# Patient Record
Sex: Female | Born: 1940
Health system: Southern US, Community
[De-identification: ages and names within clinical notes are randomized; demographics above are authoritative.]

## PROBLEM LIST (undated history)

## (undated) DIAGNOSIS — I319 Disease of pericardium, unspecified: Secondary | ICD-10-CM

## (undated) DIAGNOSIS — G459 Transient cerebral ischemic attack, unspecified: Secondary | ICD-10-CM

## (undated) DIAGNOSIS — Z9989 Dependence on other enabling machines and devices: Secondary | ICD-10-CM

## (undated) DIAGNOSIS — R42 Dizziness and giddiness: Secondary | ICD-10-CM

## (undated) DIAGNOSIS — I509 Heart failure, unspecified: Secondary | ICD-10-CM

## (undated) DIAGNOSIS — F319 Bipolar disorder, unspecified: Secondary | ICD-10-CM

## (undated) DIAGNOSIS — J189 Pneumonia, unspecified organism: Secondary | ICD-10-CM

## (undated) DIAGNOSIS — G4733 Obstructive sleep apnea (adult) (pediatric): Secondary | ICD-10-CM

## (undated) DIAGNOSIS — E039 Hypothyroidism, unspecified: Secondary | ICD-10-CM

## (undated) DIAGNOSIS — E785 Hyperlipidemia, unspecified: Secondary | ICD-10-CM

## (undated) DIAGNOSIS — Z8711 Personal history of peptic ulcer disease: Secondary | ICD-10-CM

## (undated) DIAGNOSIS — Z87442 Personal history of urinary calculi: Secondary | ICD-10-CM

## (undated) DIAGNOSIS — K219 Gastro-esophageal reflux disease without esophagitis: Secondary | ICD-10-CM

## (undated) DIAGNOSIS — Z8719 Personal history of other diseases of the digestive system: Secondary | ICD-10-CM

## (undated) DIAGNOSIS — F32A Depression, unspecified: Secondary | ICD-10-CM

## (undated) DIAGNOSIS — R32 Unspecified urinary incontinence: Secondary | ICD-10-CM

## (undated) DIAGNOSIS — F329 Major depressive disorder, single episode, unspecified: Secondary | ICD-10-CM

## (undated) DIAGNOSIS — E119 Type 2 diabetes mellitus without complications: Secondary | ICD-10-CM

## (undated) DIAGNOSIS — K559 Vascular disorder of intestine, unspecified: Secondary | ICD-10-CM

## (undated) DIAGNOSIS — I499 Cardiac arrhythmia, unspecified: Secondary | ICD-10-CM

## (undated) DIAGNOSIS — I10 Essential (primary) hypertension: Secondary | ICD-10-CM

## (undated) DIAGNOSIS — E079 Disorder of thyroid, unspecified: Secondary | ICD-10-CM

## (undated) HISTORY — PX: VAGINAL HYSTERECTOMY: SUR661

## (undated) HISTORY — DX: Major depressive disorder, single episode, unspecified: F32.9

## (undated) HISTORY — DX: Dizziness and giddiness: R42

## (undated) HISTORY — DX: Essential (primary) hypertension: I10

## (undated) HISTORY — PX: TONSILLECTOMY: SUR1361

## (undated) HISTORY — DX: Gastro-esophageal reflux disease without esophagitis: K21.9

## (undated) HISTORY — PX: RETINAL DETACHMENT SURGERY: SHX105

## (undated) HISTORY — DX: Hyperlipidemia, unspecified: E78.5

## (undated) HISTORY — DX: Unspecified urinary incontinence: R32

## (undated) HISTORY — PX: LITHOTRIPSY: SUR834

## (undated) HISTORY — DX: Depression, unspecified: F32.A

## (undated) HISTORY — PX: CATARACT EXTRACTION W/ INTRAOCULAR LENS  IMPLANT, BILATERAL: SHX1307

## (undated) HISTORY — PX: BREAST CYST ASPIRATION: SHX578

## (undated) HISTORY — PX: FRACTURE SURGERY: SHX138

## (undated) HISTORY — PX: EXCISIONAL HEMORRHOIDECTOMY: SHX1541

## (undated) HISTORY — DX: Vascular disorder of intestine, unspecified: K55.9

## (undated) HISTORY — PX: LAPAROSCOPIC CHOLECYSTECTOMY: SUR755

## (undated) HISTORY — PX: APPENDECTOMY: SHX54

## (undated) HISTORY — PX: EYE SURGERY: SHX253

## (undated) HISTORY — DX: Disorder of thyroid, unspecified: E07.9

---

## 1998-03-31 ENCOUNTER — Encounter: Payer: Self-pay | Admitting: Cardiovascular Disease

## 1998-03-31 ENCOUNTER — Inpatient Hospital Stay (HOSPITAL_COMMUNITY): Admission: EM | Admit: 1998-03-31 | Discharge: 1998-04-01 | Payer: Self-pay | Admitting: Emergency Medicine

## 1998-03-31 ENCOUNTER — Encounter: Payer: Self-pay | Admitting: Emergency Medicine

## 1998-05-26 ENCOUNTER — Ambulatory Visit: Admission: RE | Admit: 1998-05-26 | Discharge: 1998-05-26 | Payer: Self-pay | Admitting: *Deleted

## 1998-05-29 ENCOUNTER — Other Ambulatory Visit: Admission: RE | Admit: 1998-05-29 | Discharge: 1998-05-29 | Payer: Self-pay | Admitting: *Deleted

## 1999-01-06 ENCOUNTER — Encounter: Payer: Self-pay | Admitting: Pulmonary Disease

## 2000-09-06 ENCOUNTER — Ambulatory Visit (HOSPITAL_COMMUNITY): Admission: RE | Admit: 2000-09-06 | Discharge: 2000-09-06 | Payer: Self-pay | Admitting: Internal Medicine

## 2000-09-06 ENCOUNTER — Encounter: Payer: Self-pay | Admitting: Internal Medicine

## 2001-12-07 ENCOUNTER — Ambulatory Visit (HOSPITAL_COMMUNITY): Admission: RE | Admit: 2001-12-07 | Discharge: 2001-12-08 | Payer: Self-pay | Admitting: Ophthalmology

## 2001-12-07 ENCOUNTER — Encounter: Payer: Self-pay | Admitting: Ophthalmology

## 2003-03-11 ENCOUNTER — Other Ambulatory Visit: Admission: RE | Admit: 2003-03-11 | Discharge: 2003-03-11 | Payer: Self-pay | Admitting: Internal Medicine

## 2006-12-06 ENCOUNTER — Other Ambulatory Visit: Admission: RE | Admit: 2006-12-06 | Discharge: 2006-12-06 | Payer: Self-pay | Admitting: Internal Medicine

## 2006-12-09 ENCOUNTER — Encounter: Admission: RE | Admit: 2006-12-09 | Discharge: 2006-12-09 | Payer: Self-pay | Admitting: Internal Medicine

## 2006-12-14 ENCOUNTER — Ambulatory Visit (HOSPITAL_COMMUNITY): Admission: RE | Admit: 2006-12-14 | Discharge: 2006-12-14 | Payer: Self-pay | Admitting: Internal Medicine

## 2006-12-20 ENCOUNTER — Encounter (INDEPENDENT_AMBULATORY_CARE_PROVIDER_SITE_OTHER): Payer: Self-pay | Admitting: *Deleted

## 2006-12-20 ENCOUNTER — Ambulatory Visit (HOSPITAL_COMMUNITY): Admission: RE | Admit: 2006-12-20 | Discharge: 2006-12-20 | Payer: Self-pay | Admitting: *Deleted

## 2007-02-22 ENCOUNTER — Encounter (INDEPENDENT_AMBULATORY_CARE_PROVIDER_SITE_OTHER): Payer: Self-pay | Admitting: Surgery

## 2007-02-22 ENCOUNTER — Ambulatory Visit (HOSPITAL_COMMUNITY): Admission: RE | Admit: 2007-02-22 | Discharge: 2007-02-22 | Payer: Self-pay | Admitting: Surgery

## 2007-12-18 ENCOUNTER — Ambulatory Visit (HOSPITAL_COMMUNITY): Admission: RE | Admit: 2007-12-18 | Discharge: 2007-12-18 | Payer: Self-pay | Admitting: Internal Medicine

## 2008-12-30 ENCOUNTER — Ambulatory Visit (HOSPITAL_COMMUNITY): Admission: RE | Admit: 2008-12-30 | Discharge: 2008-12-30 | Payer: Self-pay | Admitting: Internal Medicine

## 2009-08-13 ENCOUNTER — Encounter: Admission: RE | Admit: 2009-08-13 | Discharge: 2009-10-16 | Payer: Self-pay | Admitting: Internal Medicine

## 2010-01-28 ENCOUNTER — Ambulatory Visit (HOSPITAL_COMMUNITY): Admission: RE | Admit: 2010-01-28 | Payer: Self-pay | Source: Home / Self Care | Admitting: Internal Medicine

## 2010-02-18 ENCOUNTER — Encounter: Payer: Self-pay | Admitting: Internal Medicine

## 2010-02-27 ENCOUNTER — Emergency Department (HOSPITAL_COMMUNITY): Payer: Medicare Other

## 2010-02-27 ENCOUNTER — Inpatient Hospital Stay (HOSPITAL_COMMUNITY)
Admission: EM | Admit: 2010-02-27 | Discharge: 2010-03-04 | DRG: 395 | Disposition: A | Discharge status: Home / Self Care | Payer: Medicare Other | Attending: Internal Medicine | Admitting: Internal Medicine

## 2010-02-27 DIAGNOSIS — R1012 Left upper quadrant pain: Secondary | ICD-10-CM | POA: Diagnosis present

## 2010-02-27 DIAGNOSIS — E669 Obesity, unspecified: Secondary | ICD-10-CM | POA: Diagnosis present

## 2010-02-27 DIAGNOSIS — R112 Nausea with vomiting, unspecified: Secondary | ICD-10-CM | POA: Diagnosis present

## 2010-02-27 DIAGNOSIS — E860 Dehydration: Secondary | ICD-10-CM | POA: Diagnosis present

## 2010-02-27 DIAGNOSIS — L270 Generalized skin eruption due to drugs and medicaments taken internally: Secondary | ICD-10-CM | POA: Diagnosis not present

## 2010-02-27 DIAGNOSIS — I10 Essential (primary) hypertension: Secondary | ICD-10-CM | POA: Diagnosis present

## 2010-02-27 DIAGNOSIS — T368X5A Adverse effect of other systemic antibiotics, initial encounter: Secondary | ICD-10-CM | POA: Diagnosis not present

## 2010-02-27 DIAGNOSIS — E039 Hypothyroidism, unspecified: Secondary | ICD-10-CM | POA: Diagnosis present

## 2010-02-27 DIAGNOSIS — A088 Other specified intestinal infections: Secondary | ICD-10-CM | POA: Diagnosis present

## 2010-02-27 DIAGNOSIS — E119 Type 2 diabetes mellitus without complications: Secondary | ICD-10-CM | POA: Diagnosis present

## 2010-02-27 DIAGNOSIS — T373X5A Adverse effect of other antiprotozoal drugs, initial encounter: Secondary | ICD-10-CM | POA: Diagnosis not present

## 2010-02-27 DIAGNOSIS — K559 Vascular disorder of intestine, unspecified: Principal | ICD-10-CM | POA: Diagnosis present

## 2010-02-27 LAB — CBC
HCT: 37.7 % (ref 36.0–46.0)
HCT: 37.1 % (ref 36.0–46.0)
Hemoglobin: 13.6 g/dL (ref 12.0–15.0)
Hemoglobin: 13.4 g/dL (ref 12.0–15.0)
MCH: 31.1 pg (ref 26.0–34.0)
MCHC: 36.1 g/dL — ABNORMAL HIGH (ref 30.0–36.0)
MCV: 85.5 fL (ref 78.0–100.0)
MCV: 86.1 fL (ref 78.0–100.0)
RBC: 4.41 MIL/uL (ref 3.87–5.11)
RDW: 11.8 % (ref 11.5–15.5)
RDW: 11.9 % (ref 11.5–15.5)
WBC: 8.8 10*3/uL (ref 4.0–10.5)

## 2010-02-27 LAB — PROTIME-INR: INR: 1 (ref 0.00–1.49)

## 2010-02-27 LAB — POCT I-STAT, CHEM 8
Chloride: 95 mEq/L — ABNORMAL LOW (ref 96–112)
Glucose, Bld: 109 mg/dL — ABNORMAL HIGH (ref 70–99)
HCT: 42 % (ref 36.0–46.0)
Hemoglobin: 14.3 g/dL (ref 12.0–15.0)
Potassium: 3.7 mEq/L (ref 3.5–5.1)
Sodium: 131 mEq/L — ABNORMAL LOW (ref 135–145)

## 2010-02-27 LAB — TYPE AND SCREEN
ABO/RH(D): B POS
Antibody Screen: NEGATIVE

## 2010-02-27 LAB — DIFFERENTIAL
Basophils Absolute: 0 10*3/uL (ref 0.0–0.1)
Eosinophils Relative: 1 % (ref 0–5)
Lymphocytes Relative: 25 % (ref 12–46)
Lymphs Abs: 2.2 10*3/uL (ref 0.7–4.0)
Neutro Abs: 5.5 10*3/uL (ref 1.7–7.7)

## 2010-02-27 LAB — LACTIC ACID, PLASMA: Lactic Acid, Venous: 1.2 mmol/L (ref 0.5–2.2)

## 2010-02-28 LAB — GLUCOSE, CAPILLARY
Glucose-Capillary: 124 mg/dL — ABNORMAL HIGH (ref 70–99)
Glucose-Capillary: 162 mg/dL — ABNORMAL HIGH (ref 70–99)

## 2010-02-28 LAB — BASIC METABOLIC PANEL
CO2: 28 mEq/L (ref 19–32)
Calcium: 8.6 mg/dL (ref 8.4–10.5)
Chloride: 102 mEq/L (ref 96–112)
Creatinine, Ser: 0.79 mg/dL (ref 0.4–1.2)
Glucose, Bld: 129 mg/dL — ABNORMAL HIGH (ref 70–99)
Sodium: 137 mEq/L (ref 135–145)

## 2010-02-28 LAB — CBC
Hemoglobin: 12.5 g/dL (ref 12.0–15.0)
MCH: 30.3 pg (ref 26.0–34.0)
MCV: 87.7 fL (ref 78.0–100.0)
Platelets: 241 10*3/uL (ref 150–400)
RBC: 4.13 MIL/uL (ref 3.87–5.11)
WBC: 8.1 10*3/uL (ref 4.0–10.5)

## 2010-02-28 LAB — ABO/RH: ABO/RH(D): B POS

## 2010-03-01 ENCOUNTER — Inpatient Hospital Stay (HOSPITAL_COMMUNITY): Payer: Medicare Other

## 2010-03-01 LAB — DIFFERENTIAL
Lymphocytes Relative: 28 % (ref 12–46)
Lymphs Abs: 1.9 10*3/uL (ref 0.7–4.0)
Monocytes Relative: 12 % (ref 3–12)
Neutrophils Relative %: 58 % (ref 43–77)

## 2010-03-01 LAB — COMPREHENSIVE METABOLIC PANEL
Alkaline Phosphatase: 92 U/L (ref 39–117)
BUN: 4 mg/dL — ABNORMAL LOW (ref 6–23)
Chloride: 102 mEq/L (ref 96–112)
Creatinine, Ser: 0.71 mg/dL (ref 0.4–1.2)
Glucose, Bld: 125 mg/dL — ABNORMAL HIGH (ref 70–99)
Potassium: 4 mEq/L (ref 3.5–5.1)
Total Bilirubin: 0.5 mg/dL (ref 0.3–1.2)
Total Protein: 6 g/dL (ref 6.0–8.3)

## 2010-03-01 LAB — CBC
HCT: 37.4 % (ref 36.0–46.0)
MCH: 30.2 pg (ref 26.0–34.0)
MCV: 86.8 fL (ref 78.0–100.0)
RDW: 11.9 % (ref 11.5–15.5)
WBC: 6.9 10*3/uL (ref 4.0–10.5)

## 2010-03-01 LAB — MAGNESIUM: Magnesium: 2.2 mg/dL (ref 1.5–2.5)

## 2010-03-01 LAB — GLUCOSE, CAPILLARY
Glucose-Capillary: 117 mg/dL — ABNORMAL HIGH (ref 70–99)
Glucose-Capillary: 102 mg/dL — ABNORMAL HIGH (ref 70–99)

## 2010-03-01 LAB — FECAL LACTOFERRIN, QUANT: Fecal Lactoferrin: POSITIVE

## 2010-03-01 NOTE — H&P (Signed)
[name], [name]             ACCOUNT NO.:  [id number]  MEDICAL RECORD NO.:  [id number]           PATIENT TYPE:  E  LOCATION:  WLED                         FACILITY:  [facility]  PHYSICIAN:  Charlestine Massed, MDDATE OF BIRTH:  [birth year]  DATE OF ADMISSION:  02/27/2010 DATE OF DISCHARGE:                             HISTORY & PHYSICAL   PRIMARY CARE DOCTOR:  Juline Patch, MD  CHIEF COMPLAINT:  Significant diarrhea and blood in the stool.  HISTORY OF PRESENTING ILLNESS:  [name] is a 70 year old female who comes to the ER with the above-mentioned complaints.  The patient said on Tuesday evening, she started having nausea and on Wednesday morning, she has had nausea with vomiting which continued on the whole day with multiple episodes of nausea and vomiting.  On Thursday morning, she started having diarrhea and on Thursday evening, she started seeing some blood in the stools.  She called her PMD and the PMD called today and ordered some medications for nausea and vomiting, and he asked her to go to the ER if she bleeds again.  She did have blood in the stools multiple times after that, so she came to the ER.  The patient denies any significant abdominal pain.  No pain during defecation.  No tenderness.  No chest pain, no shortness of breath.  No fever, no chills.  The patient has some cough which she says is possibly due to some allergies, but no sputum production.  The patient has had previous history of colonic polyps and she has been told that she has some diverticuli also in her.  From what I can recollect from the previous colonoscopy report in December 2008, there were some colonic polyps that were removed but at no place, there is mention about any significant diverticuli present.  Her colonoscopy was done by Dr. Dulce Sellar, which currently we do not have the report on.  PAST HISTORY:  Diabetes mellitus, depression, hypertension, hyperthyroidism, history of  retinal detachment, history of snake bite.  PAST SURGICAL HISTORY:  Appendectomy, hysterectomy, tonsillectomy.  DRUG ALLERGIES:  Allergy to METFORMIN.  SOCIAL HISTORY:  Nonsmoker.  No alcohol.  No drug use.  CURRENT MEDICATIONS:  Aspirin, Onglyza, bupropion, Nexium, Carbatrol, carbamazepine, hydroxyzine, vitamin D3, azole, levothyroxine, Toviaz, and Cipro twice daily recently prescribed.  REVIEW OF SYSTEMS:  A 12-point system review unremarkable.  Positive pertinent features as in history of presenting illness.  Negative otherwise.  PHYSICAL EXAMINATION:  VITAL SIGNS:  In the ED, blood pressure 147/63, heart rate 83, respirations 18, temperature 98.5, and O2 sat 97% on room air.  Afebrile. GENERAL:  The patient is awake, alert, well oriented, not in any distress. HEAD AND NECK:  No JVD.  No bruit.  No nodes.  Tongue is moist. CHEST:  Bilateral air entry is good anteriorly and posteriorly.  No rales or wheeze heard. CARDIAC:  S1 and S2 heard, regular.  No murmur. ABDOMEN:  There is tenderness in the right middle quadrant and the left middle quadrant.  There is also some tenderness as per the patient in the right middle quadrant.  Also, there is no rigidity or guarding.  No rebound tenderness.  No costovertebral angle tenderness.  Bowel sounds positive. EXTREMITIES:  Bilateral trace pedal edema present. CNS:  No obvious motor sensory deficits.  Speech and comprehension intact.  LABS:  CBC; WBC 8.8, hemoglobin 13, hematocrit 37.7, platelets 258.  I- STAT shows BNP, sodium of 131, potassium 3.7, chloride 95, bicarb 26, BUN 7, creatinine 0.8, and glucose 109.  INR is 1.0.  Lactic acid is 1.2.  CAT scan of abdomen and pelvis shows acute inflammatory changes involving the distal transverse colon and proximal descending colon. This could be related to infectious or inflammatory or ischemic colitis. Considering the classic distribution, it could be a watershed infarct.  ASSESSMENT  AND PLAN: 1. Acute gastroenteritis, possibly secondary to norovirus which is     resolving. 2. Lower gastrointestinal bleed, possibly due to;     a.     Questionable diverticulosis as the patient states clots in      the stool.     b.     Possible ischemic colitis due to this location of the      inflammatory change. 3. Colitis as on CAT scan. 4. Diabetes mellitus type 2. 5. Obesity. 6. History of colonic polyps. 7. Hypothyroidism. 8. Hypertension.  PLAN:  At this time, we will admit the patient to Inpatient Service on the regular floor.  She will have a CBC drawn q.8 hourly for the next 8 hours and then everyday.  The next draw will be at 10 p.m.  Continue normal saline at 100 mL per hour.  BMET in the a.m. along with a CBC. If she bleeds repeatedly, we will decide about beginning a bleeding scan at that time.  We will keep her n.p.o. at this time strictly, ice chips can be okay, Synthroid 37.5 mcg IV daily.  We will check a CBG q.4 hourly and since the patient is going to be n.p.o., coverage for the blood sugar more than 250, we will give some insulin coverage at this time.  Zofran p.r.n. for nausea and vomiting, morphine 2 mg q.4 hourly p.r.n. for severe pain and other than that, I am not advising any further testing at this time.  Stool wbc's will be checked.  I would not suggest starting any antibiotics at this time since this looks like straightforward case of viral gastroenteritis with some disruption in the colonic mucosa, causing either ischemic colitis or diverticulosis, diverticular bleed.  We will apply SCDs for DVT prophylaxis at this time.  We will recheck vital signs q.4 hourly for now.  A total of 70 minutes spent on the admission process.     Charlestine Massed, MD     UT/MEDQ  D:  02/27/2010  T:  02/27/2010  Job:  [id number]  cc:   Juline Patch, M.D. Willis Modena, MD  Electronically Signed by Charlestine Massed MD on 02/28/2010 11:47:59 AM

## 2010-03-02 LAB — GLUCOSE, CAPILLARY: Glucose-Capillary: 153 mg/dL — ABNORMAL HIGH (ref 70–99)

## 2010-03-02 LAB — BASIC METABOLIC PANEL
Chloride: 103 mEq/L (ref 96–112)
GFR calc Af Amer: >60 mL/min (ref >60)
Potassium: 4.2 mEq/L (ref 3.5–5.1)
Sodium: 137 mEq/L (ref 135–145)

## 2010-03-02 LAB — CBC
HCT: 37.5 % (ref 36.0–46.0)
Hemoglobin: 13.3 g/dL (ref 12.0–15.0)
MCV: 86.2 fL (ref 78.0–100.0)
Platelets: 308 10*3/uL (ref 150–400)
RBC: 4.35 MIL/uL (ref 3.87–5.11)
WBC: 6.9 10*3/uL (ref 4.0–10.5)

## 2010-03-03 LAB — GLUCOSE, CAPILLARY
Glucose-Capillary: 91 mg/dL (ref 70–99)
Glucose-Capillary: 160 mg/dL — ABNORMAL HIGH (ref 70–99)
Glucose-Capillary: 266 mg/dL — ABNORMAL HIGH (ref 70–99)

## 2010-03-03 LAB — CBC
HCT: 36.2 % (ref 36.0–46.0)
Hemoglobin: 13 g/dL (ref 12.0–15.0)
MCHC: 35.9 g/dL (ref 30.0–36.0)

## 2010-03-04 LAB — CBC
HCT: 37.8 % (ref 36.0–46.0)
Hemoglobin: 13.3 g/dL (ref 12.0–15.0)
MCH: 30.6 pg (ref 26.0–34.0)
MCHC: 35.2 g/dL (ref 30.0–36.0)
MCV: 86.9 fL (ref 78.0–100.0)

## 2010-03-04 LAB — GLUCOSE, CAPILLARY

## 2010-03-04 NOTE — Progress Notes (Signed)
NAMEJOVEE, [name]             ACCOUNT NO.:  [id number]  MEDICAL RECORD NO.:  [id number]           PATIENT TYPE:  I  LOCATION:  1519                         FACILITY:  [facility]  PHYSICIAN:  Charlestine Massed, MDDATE OF BIRTH:  1940/12/15                                PROGRESS NOTE   SUBJECTIVE: The patient seen and examined today.  She feels lot better today, not in any distress.  She did not have any vomiting so far, tolerating food very well.  She still has some tenderness in the left upper quadrant and no fever, no chills.  OBJECTIVE: VITAL SIGNS:  Temperature is 97.9, heart rate 57, respiration 20, blood pressure 159/53, O2 saturation 96% on room air. GENERAL:  The patient is awake, alert, ambulating, not in any distress. No nausea. HEAD AND NECK:  No bleeding oral or nasal mucosa.  No JVD.  Tongue is moist. CHEST:  Bilateral air entry is good anteriorly and posteriorly.  No rales or wheeze heard. CARDIAC:  S1 and S2 heard, regular.  No murmurs. ABDOMEN:  Soft.  There is tenderness in the left upper quadrant still, but no the rigidity or guarding present.  Bowel sounds are heard now and they are normal paced and normal sounds.  No costovertebral angle tenderness. EXTREMITIES:  Trace pedal edema.  LABORATORY DATA: CBC, WBC 9.8, hemoglobin 13, hematocrit 36.1, and platelets 319.  ASSESSMENT/PLAN: 1. Acute gastroenteritis - possibly a viral etiology, currently     resolved. 2. Ischemic colitis. 3. Hematochezia secondary to ischemic colitis. 4. History of diverticulosis which could also had contributed to the     hematochezia. 5. No significant drop in hemoglobin - blood loss was minimal and     resolved. 6. Obesity. 7. Hypertension, currently blood pressure is slightly elevated. 8. Allergy reaction after starting Cipro and Flagyl - not sure of     which one caused the rash, currently off those medications. 9. Diabetes mellitus type  2. 10.Hypothyroidism.  PLAN: 1. GI - the patient's GI issues started few days before she came in,     started nausea, vomiting and then progressed to watery diarrhea and     subsequently presence of red blood in the stools with significant     pain in the left upper quadrant.  The CAT scan findings and the     clinical picture all point towards the cause as ischemic colitis.     The patient possibly got significantly dehydrated due to the     recurrent nausea, vomiting, and diarrhea which possibly could have     led her into hypovolemia, leading to ischemia, and the watershed     zone of the splenic flexure of the colon leading to ischemic     colitis.  To note that the patient has diverticulosis as per the     colonoscopy report in 2008.  Currently, it is not clear which one     is causing the bleed, but the bleeding was minimal and it resolved     at this time.  There is no hemoglobin drop noted.  She is well     hydrated at  this time.  She is tolerating clear liquids and will be     upgrading into full liquids for the morning and then progressed to     soft diet for supper.  The patient is currently well stable and     will need a gastroenterology followup as outpatient with Dr. Sabino Gasser. She will set up followup with the [facility] as     outpatient.  She did say that she had a colonoscopy done by Dr.     Dulce Sellar recently and so will follow with Dr. Dulce Sellar as outpatient. 2. Allergic reaction.  It is not clear which one caused the allergy     since Cipro and Flagyl was started the same time and she started     getting some rash in her thigh after which both medications were     stopped.  Currently, she does not require any medications or     antibiotics at this time.  She is on Solu-Medrol 40 q.12 h, which     will be switched over to prednisone 20 mg for the next 2 days and     taper it over the next two days.  Will continue with the Benadryl     p.o. and Pepcid  p.o.  Currently, there are no signs of any     recurrent allergic reaction. 3. Hypertension.  She is on Benicar.  Her home medications included     Benicar 40 and Azopt 10, so will restart those medications at this     time.  We will restart her home medication of bupropion and     carbamazepine at this time also and we will watch her closely. 4. DVT prophylaxis, currently with SCDs. 5. Diabetes mellitus.  Currently, her blood sugars are stable. To note     that she is allergic to metformin and she was on Onglyza,  we will     restart the Onglyza at this time and watch her closely.  Even with     Solu-Medrol her sugars have not spiked that much.  I would not     check a hemoglobin A1c at this time since it will be erratic and it     can be checked as outpatient.  DISPOSITION: The patient will be ready for discharge in another 2 days.  Will need to follow up with Dr. Dulce Sellar as outpatient.     Charlestine Massed, MD     UT/MEDQ  D:  03/03/2010  T:  03/03/2010  Job:  [id number]  Electronically Signed by Charlestine Massed MD on 03/04/2010 03:15:07 PM

## 2010-03-11 ENCOUNTER — Other Ambulatory Visit: Payer: Self-pay | Admitting: Gastroenterology

## 2010-03-11 DIAGNOSIS — R05 Cough: Secondary | ICD-10-CM

## 2010-03-11 DIAGNOSIS — R059 Cough, unspecified: Secondary | ICD-10-CM

## 2010-03-11 DIAGNOSIS — R0602 Shortness of breath: Secondary | ICD-10-CM

## 2010-03-11 DIAGNOSIS — R112 Nausea with vomiting, unspecified: Secondary | ICD-10-CM

## 2010-03-13 ENCOUNTER — Ambulatory Visit
Admission: RE | Admit: 2010-03-13 | Discharge: 2010-03-13 | Disposition: A | Discharge status: Home / Self Care | Payer: Medicare Other | Source: Ambulatory Visit | Attending: Gastroenterology | Admitting: Gastroenterology

## 2010-03-13 ENCOUNTER — Other Ambulatory Visit: Payer: Self-pay | Admitting: Gastroenterology

## 2010-03-13 DIAGNOSIS — R112 Nausea with vomiting, unspecified: Secondary | ICD-10-CM

## 2010-03-13 DIAGNOSIS — R0602 Shortness of breath: Secondary | ICD-10-CM

## 2010-03-13 DIAGNOSIS — R05 Cough: Secondary | ICD-10-CM

## 2010-03-13 DIAGNOSIS — R059 Cough, unspecified: Secondary | ICD-10-CM

## 2010-03-17 ENCOUNTER — Inpatient Hospital Stay (HOSPITAL_COMMUNITY)
Admission: EM | Admit: 2010-03-17 | Discharge: 2010-03-22 | DRG: 394 | Disposition: A | Discharge status: Home / Self Care | Payer: Medicare Other | Attending: Internal Medicine | Admitting: Internal Medicine

## 2010-03-17 ENCOUNTER — Emergency Department (HOSPITAL_COMMUNITY): Payer: Medicare Other

## 2010-03-17 DIAGNOSIS — E876 Hypokalemia: Secondary | ICD-10-CM | POA: Diagnosis present

## 2010-03-17 DIAGNOSIS — I519 Heart disease, unspecified: Secondary | ICD-10-CM | POA: Diagnosis present

## 2010-03-17 DIAGNOSIS — IMO0001 Reserved for inherently not codable concepts without codable children: Secondary | ICD-10-CM | POA: Diagnosis present

## 2010-03-17 DIAGNOSIS — K3184 Gastroparesis: Secondary | ICD-10-CM | POA: Diagnosis present

## 2010-03-17 DIAGNOSIS — E039 Hypothyroidism, unspecified: Secondary | ICD-10-CM | POA: Diagnosis present

## 2010-03-17 DIAGNOSIS — I10 Essential (primary) hypertension: Secondary | ICD-10-CM | POA: Diagnosis present

## 2010-03-17 DIAGNOSIS — K559 Vascular disorder of intestine, unspecified: Principal | ICD-10-CM | POA: Diagnosis present

## 2010-03-17 DIAGNOSIS — G459 Transient cerebral ischemic attack, unspecified: Secondary | ICD-10-CM | POA: Diagnosis not present

## 2010-03-17 DIAGNOSIS — R0789 Other chest pain: Secondary | ICD-10-CM | POA: Diagnosis not present

## 2010-03-17 DIAGNOSIS — E669 Obesity, unspecified: Secondary | ICD-10-CM | POA: Diagnosis present

## 2010-03-17 DIAGNOSIS — R112 Nausea with vomiting, unspecified: Secondary | ICD-10-CM | POA: Diagnosis present

## 2010-03-17 DIAGNOSIS — Z5309 Procedure and treatment not carried out because of other contraindication: Secondary | ICD-10-CM

## 2010-03-17 LAB — URINALYSIS, ROUTINE W REFLEX MICROSCOPIC
Bilirubin Urine: NEGATIVE
Ketones, ur: NEGATIVE mg/dL
Leukocytes, UA: NEGATIVE
Protein, ur: 30 mg/dL — AB
Urine Glucose, Fasting: NEGATIVE mg/dL
pH: 8 (ref 5.0–8.0)

## 2010-03-17 LAB — GLUCOSE, CAPILLARY: Glucose-Capillary: 105 mg/dL — ABNORMAL HIGH (ref 70–99)

## 2010-03-17 LAB — COMPREHENSIVE METABOLIC PANEL
ALT: 30 U/L (ref 0–35)
Albumin: 3.8 g/dL (ref 3.5–5.2)
Alkaline Phosphatase: 107 U/L (ref 39–117)
BUN: 10 mg/dL (ref 6–23)
Chloride: 99 mEq/L (ref 96–112)
Glucose, Bld: 159 mg/dL — ABNORMAL HIGH (ref 70–99)
Potassium: 4.7 mEq/L (ref 3.5–5.1)
Sodium: 134 mEq/L — ABNORMAL LOW (ref 135–145)
Total Bilirubin: 0.8 mg/dL (ref 0.3–1.2)
Total Protein: 6.7 g/dL (ref 6.0–8.3)

## 2010-03-17 LAB — CBC
HCT: 39.7 % (ref 36.0–46.0)
MCV: 87.8 fL (ref 78.0–100.0)
Platelets: 347 10*3/uL (ref 150–400)
RBC: 4.52 MIL/uL (ref 3.87–5.11)
WBC: 6.1 10*3/uL (ref 4.0–10.5)

## 2010-03-17 LAB — DIFFERENTIAL
Basophils Absolute: 0 10*3/uL (ref 0.0–0.1)
Lymphocytes Relative: 15 % (ref 12–46)
Lymphs Abs: 0.9 10*3/uL (ref 0.7–4.0)
Neutrophils Relative %: 75 % (ref 43–77)

## 2010-03-17 MED ORDER — IOHEXOL 300 MG/ML  SOLN
100.0000 mL | Freq: Once | INTRAMUSCULAR | Status: AC | PRN
  Administered 2010-03-17: 100 mL via INTRAVENOUS

## 2010-03-18 ENCOUNTER — Inpatient Hospital Stay (HOSPITAL_COMMUNITY): Payer: Medicare Other

## 2010-03-18 LAB — BASIC METABOLIC PANEL
BUN: 5 mg/dL — ABNORMAL LOW (ref 6–23)
Creatinine, Ser: 0.76 mg/dL (ref 0.4–1.2)
GFR calc non Af Amer: >60 mL/min (ref >60)
Glucose, Bld: 121 mg/dL — ABNORMAL HIGH (ref 70–99)
Potassium: 4 mEq/L (ref 3.5–5.1)

## 2010-03-18 LAB — CBC
HCT: 38 % (ref 36.0–46.0)
MCH: 30 pg (ref 26.0–34.0)
MCHC: 33.7 g/dL (ref 30.0–36.0)
MCV: 89 fL (ref 78.0–100.0)
Platelets: 318 10*3/uL (ref 150–400)
RDW: 12.1 % (ref 11.5–15.5)
WBC: 5.2 10*3/uL (ref 4.0–10.5)

## 2010-03-18 LAB — GLUCOSE, CAPILLARY

## 2010-03-18 LAB — MAGNESIUM: Magnesium: 2.3 mg/dL (ref 1.5–2.5)

## 2010-03-18 LAB — CLOSTRIDIUM DIFFICILE BY PCR: Toxigenic C. Difficile by PCR: NEGATIVE

## 2010-03-18 LAB — CARDIAC PANEL(CRET KIN+CKTOT+MB+TROPI)
CK, MB: 1.6 ng/mL (ref 0.3–4.0)
Total CK: 40 U/L (ref 7–177)

## 2010-03-18 LAB — PHOSPHORUS: Phosphorus: 3 mg/dL (ref 2.3–4.6)

## 2010-03-18 LAB — URINE CULTURE

## 2010-03-18 LAB — HEMOGLOBIN A1C: Hgb A1c MFr Bld: 7.1 % — ABNORMAL HIGH (ref <5.7)

## 2010-03-18 NOTE — Discharge Summary (Signed)
[name], BOEHRINGER             ACCOUNT NO.:  [id number]  MEDICAL RECORD NO.:  [id number]           PATIENT TYPE:  I  LOCATION:  1519                         [facility]ACILITY:  [facility]  PHYSICIAN:  Zareah Hunzeker I Farrel Guimond, MD      DATE OF BIRTH:  Jan 21, 1940  DATE OF ADMISSION:  02/27/2010 DATE OF DISCHARGE:  03/04/2010                              DISCHARGE SUMMARY   DISCHARGE DIAGNOSES: 1. Acute gastroenteritis, resolved. 2. Ischemic colitis, felt to be secondary to possible dehydration and     hypertension. 3. Hematochezia, resolved secondary to ischemic colitis. 4. History of diverticulosis. 5. Hypertension. 6. Diabetes mellitus, type 2, not on medication. 7. Hypothyroidism.  DISCHARGE MEDICATIONS:  The patient will resume her home medications, mainly 1. Fluticasone topical cream. 2. Aspirin 81 mg p.o. daily. 3. Vitamin D3, 2000 units daily. 4. Synthroid 75 mcg p.o. daily. 5. Onglyza 5 mg p.o. daily. 6. Nexium 40 mg p.o. daily. 7. Azor 10/40 one tablet daily. 8. Cardizem 240 mg p.o. daily. 9. Carbamazepine 200 mg/400. 10.Bupropion XL 150 mg daily.  CONSULTATION:  None.  PROCEDURE: 1. CT of the abdomen and pelvis did show acute inflammatory change,     involved the distal transverse colon and proximal descending colon.     There is no specific finding could be related to infectious,     inflammatory, ischemic colitis, could be of patient's symptom of     hypertension. 2. Abdominal x-ray, no acute finding.  PHYSICAL EXAMINATION:  VITAL SIGNS:  Temperature 98.2, blood pressure 151/75, respiratory rate 18, saturating 68. ABDOMEN:  She has mild tenderness at the left upper quadrant, but no rigidity or guarding.  Bowel sounds are heard, now they are normal sounds.  The patient has bowel movements.  PROBLEMS: 1. Acute gastroenteritis and ischemic colitis.  Her symptoms started     with nausea, vomiting, and then progressed to water diarrhea and     subsequently present with red  blood in the stool with significant     pain in the left upper quadrant.  CT scan suggested inflammatory,     ischemic colitis, possibly the patient get dehydrated especially     with taking hypertensive medication.  She became dehydrated and     hypotensive.  The patient was treated supportively with IV fluids.     Her diet was advanced.  The patient will follow up with Dr. Sabino Gasser and follow up with her primary care physician.  The patient     recently has colonoscopy last year as per patient and it was     diverticulosis.  Her CT scan did not suggest any diverticulosis. 2. Hypertension.  The patient will resume her blood pressure     medication.  The patient tolerated regular diet today.  Her lactic     acid was normal.  No evidence of leukocytosis.  Her LFTs were     normal.  Her C diff toxin was negative.  We felt the main symptoms     are secondary to ischemic colitis, which is treated supportively.     Currently, we felt the patient  is stable for discharge, needs to     follow up with Dr. Iva Lento on Monday, this month, and needs to     follow up with Dr. Sabino Gasser.     Tylan Briguglio Bosie Helper, MD     HIE/MEDQ  D:  03/04/2010  T:  03/05/2010  Job:  [id number]  cc:   Georgiana Spinner, M.D. Fax: [phone]  Dr. Iva Lento  Electronically Signed by Ebony Cargo MD on 03/18/2010 02:11:32 PM

## 2010-03-19 ENCOUNTER — Inpatient Hospital Stay (HOSPITAL_COMMUNITY): Payer: Medicare Other

## 2010-03-19 DIAGNOSIS — R072 Precordial pain: Secondary | ICD-10-CM

## 2010-03-19 DIAGNOSIS — R079 Chest pain, unspecified: Secondary | ICD-10-CM

## 2010-03-19 LAB — COMPREHENSIVE METABOLIC PANEL
Alkaline Phosphatase: 102 U/L (ref 39–117)
BUN: 5 mg/dL — ABNORMAL LOW (ref 6–23)
CO2: 26 mEq/L (ref 19–32)
Calcium: 8.7 mg/dL (ref 8.4–10.5)
GFR calc non Af Amer: >60 mL/min (ref >60)
Glucose, Bld: 126 mg/dL — ABNORMAL HIGH (ref 70–99)
Total Protein: 5.9 g/dL — ABNORMAL LOW (ref 6.0–8.3)

## 2010-03-19 LAB — GLUCOSE, CAPILLARY
Glucose-Capillary: 100 mg/dL — ABNORMAL HIGH (ref 70–99)
Glucose-Capillary: 111 mg/dL — ABNORMAL HIGH (ref 70–99)
Glucose-Capillary: 130 mg/dL — ABNORMAL HIGH (ref 70–99)
Glucose-Capillary: 125 mg/dL — ABNORMAL HIGH (ref 70–99)
Glucose-Capillary: 147 mg/dL — ABNORMAL HIGH (ref 70–99)

## 2010-03-19 LAB — CARDIAC PANEL(CRET KIN+CKTOT+MB+TROPI)
CK, MB: 1.4 ng/mL (ref 0.3–4.0)
CK, MB: 1.1 ng/mL (ref 0.3–4.0)
CK, MB: 1 ng/mL (ref 0.3–4.0)
Relative Index: INVALID (ref 0.0–2.5)
Relative Index: INVALID (ref 0.0–2.5)
Troponin I: 0.01 ng/mL (ref 0.00–0.06)
Troponin I: 0.01 ng/mL (ref 0.00–0.06)

## 2010-03-19 NOTE — Consult Note (Signed)
  NAMERIE, MCNEIL             ACCOUNT NO.:  [id number]  MEDICAL RECORD NO.:  [id number]           PATIENT TYPE:  I  LOCATION:  1502                         FACILITY:  [facility]  PHYSICIAN:  Bernette Redbird, M.D.   DATE OF BIRTH:  1940-07-22  DATE OF CONSULTATION:  03/18/2010 DATE OF DISCHARGE:                                CONSULTATION   Dr. Gwenlyn Perking of the [facility] asked [place] to see this very pleasant 70 year old female because of colitis.  The patient is well-known to my partner, Dr. Dulce Sellar, who did colonoscopy on her about a year ago for followup of prior colonic adenomas. Ironically, he had seen her in the office just the day prior to admission because of some nonspecific symptoms.  She is 2-week status post hospitalization for left-sided colitis which was associated with rectal bleeding and felt most likely to be secondary to ischemia, although she was not colonoscoped at that time to confirm the diagnosis. She then did well until shortly prior to admission when she developed pain more on the right side and had a repeat CT scan, this showing right- sided colitis.  At this time, however, she has not had rectal bleeding. Since coming in the hospital, she has been afebrile, has had a completely normal white count, and has had a prompt spontaneous resolution of both her diarrhea and her abdominal pain.  ALLERGIES:  METFORMIN, CIPRO and FLAGYL.  OUTPATIENT MEDICATIONS:  Aspirin, Onglyza, BuSpar, Nexium, Carbatrol, carbamazepine, hydroxyzine, Azor, levothyroxine, fluticasone.  OPERATIONS: 1. Appendectomy. 2. Hysterectomy.  MEDICAL ILLNESSES: 1. Obesity. 2. Diabetes. 3. Depression. 4. Hypertension. 5. Hypothyroidism. 6. Recent ischemic colitis as noted above.  HABITS:  Nonsmoker, nondrinker.  FAMILY HISTORY:  Rectal cancer in her son.  SOCIAL HISTORY:  Not obtained.  REVIEW OF SYSTEMS:  The patient had vomiting prior to admission with severe dry heaves.  This  has now resolved.  PHYSICAL EXAMINATION:  GENERAL:  A pleasant, substantially overweight Caucasian female, in no acute distress and appearing neither anxious nor depressed at the time of my evaluation. HEENT:  Anicteric, no pallor. SKIN:  Warm and dry. CHEST:  Clear. HEART:  Normal. ABDOMEN:  Without organomegaly, guarding, mass effect, or tenderness at this time.  IMPRESSION:  Probable recurrent ischemic colitis in a different vascular distribution than before.  PLAN:  Colonoscopy tomorrow after a gentle prep.  The rationale behind this was discussed with the patient and she is agreeable.  Further management to depend on the colonoscopic findings.  In the meantime, I would favor continued observation off antibiotics and continuation of a clear liquid diet.          ______________________________ Bernette Redbird, M.D.     RB/MEDQ  D:  03/18/2010  T:  03/18/2010  Job:  [id number]  cc:   Juline Patch, M.D. Fax: [phone]  Willis Modena, MD Fax: [phone]  Electronically Signed by Bernette Redbird M.D. on 03/19/2010 07:59:46 AM

## 2010-03-20 DIAGNOSIS — G459 Transient cerebral ischemic attack, unspecified: Secondary | ICD-10-CM

## 2010-03-20 LAB — CARDIAC PANEL(CRET KIN+CKTOT+MB+TROPI)
CK, MB: 1 ng/mL (ref 0.3–4.0)
Relative Index: INVALID (ref 0.0–2.5)
Total CK: 21 U/L (ref 7–177)
Troponin I: 0.01 ng/mL (ref 0.00–0.06)

## 2010-03-20 LAB — BASIC METABOLIC PANEL
BUN: 7 mg/dL (ref 6–23)
CO2: 31 mEq/L (ref 19–32)
Chloride: 102 mEq/L (ref 96–112)
GFR calc non Af Amer: >60 mL/min (ref >60)
Glucose, Bld: 127 mg/dL — ABNORMAL HIGH (ref 70–99)
Potassium: 4.8 mEq/L (ref 3.5–5.1)
Sodium: 140 mEq/L (ref 135–145)

## 2010-03-20 LAB — LIPID PANEL
HDL: 40 mg/dL (ref >39)
Triglycerides: 146 mg/dL (ref <150)
VLDL: 29 mg/dL (ref 0–40)

## 2010-03-20 LAB — CBC
HCT: 40.2 % (ref 36.0–46.0)
Hemoglobin: 13.5 g/dL (ref 12.0–15.0)
MCV: 88.7 fL (ref 78.0–100.0)
RBC: 4.53 MIL/uL (ref 3.87–5.11)
RDW: 11.9 % (ref 11.5–15.5)
WBC: 5.9 10*3/uL (ref 4.0–10.5)

## 2010-03-20 LAB — GLUCOSE, CAPILLARY
Glucose-Capillary: 152 mg/dL — ABNORMAL HIGH (ref 70–99)
Glucose-Capillary: 137 mg/dL — ABNORMAL HIGH (ref 70–99)
Glucose-Capillary: 102 mg/dL — ABNORMAL HIGH (ref 70–99)
Glucose-Capillary: 108 mg/dL — ABNORMAL HIGH (ref 70–99)

## 2010-03-21 ENCOUNTER — Ambulatory Visit (HOSPITAL_COMMUNITY): Payer: Medicare Other

## 2010-03-21 LAB — BASIC METABOLIC PANEL
BUN: 15 mg/dL (ref 6–23)
Calcium: 9.2 mg/dL (ref 8.4–10.5)
GFR calc non Af Amer: >60 mL/min (ref >60)
Potassium: 3.7 mEq/L (ref 3.5–5.1)

## 2010-03-21 LAB — GLUCOSE, CAPILLARY
Glucose-Capillary: 121 mg/dL — ABNORMAL HIGH (ref 70–99)
Glucose-Capillary: 106 mg/dL — ABNORMAL HIGH (ref 70–99)

## 2010-03-21 MED ORDER — TECHNETIUM TC 99M TETROFOSMIN IV KIT
10.0000 | PACK | Freq: Once | INTRAVENOUS | Status: AC | PRN
  Administered 2010-03-21: 10 via INTRAVENOUS

## 2010-03-21 MED ORDER — TECHNETIUM TC 99M TETROFOSMIN IV KIT
30.0000 | PACK | Freq: Once | INTRAVENOUS | Status: AC | PRN
  Administered 2010-03-21: 30 via INTRAVENOUS

## 2010-03-22 ENCOUNTER — Other Ambulatory Visit (HOSPITAL_COMMUNITY): Payer: Medicare Other

## 2010-03-22 LAB — GLUCOSE, CAPILLARY: Glucose-Capillary: 134 mg/dL — ABNORMAL HIGH (ref 70–99)

## 2010-04-03 NOTE — Consult Note (Signed)
NAMEVERNECIA, UMBLE             ACCOUNT NO.:  [id number]  MEDICAL RECORD NO.:  [id number]           PATIENT TYPE:  I  LOCATION:  1235                         FACILITY:  [facility]  PHYSICIAN:  Armstrong Creasy P. Pearlean Brownie, MD    DATE OF BIRTH:  16-Jun-1940  DATE OF CONSULTATION: DATE OF DISCHARGE:                                CONSULTATION   REFERRING PHYSICIAN:  [facility].  REASON FOR REFERRAL:  TIA.  HISTORY OF PRESENT ILLNESS:  [name] is a 70 year old Caucasian lady who developed sudden onset of left-sided numbness, slurred speech, and weakness at 7 p.m. last night.  She was admitted with concurrent chest discomfort as well as headache.  The headache did not last long, but left-sided numbness involving the face, arm, and leg persisted throughout last night.  She also noticed some slurred speech, which only lasted for a few minutes.  This morning, she noticed improvement in headache as well as left-sided numbness, which has practically resolved except she still has some numbness in the left cheek and around the lips.  She denies prior history of stroke, TIA, seizures, or migraine headaches.  PAST MEDICAL HISTORY:  Significant for, 1. Diabetes. 2. Hypertension. 3. Obesity. 4. Depression. 5. Hypothyroidism. 6. Ischemic colitis. 7. Appendicectomy. 8. Hysterectomy.  HOME MEDICATIONS:  Aspirin, Carbatrol, levothyroxine, fluticasone, hydroxyzine, Azor, Nexium, BuSpar, and Onglyza.  ALLERGIES TO MEDICATIONS: 1. FLAGYL. 2. METFORMIN. 3. CIPRO.  FAMILY HISTORY:  Rectal cancer in her son.  SOCIAL HISTORY:  She is retired, lives at home, is independent in activities of daily living, does not smoke or drink.  REVIEW OF SYSTEMS:  Positive for ischemic colitis, abdominal pain, nausea.  PHYSICAL EXAMINATION:  GENERAL:  Obese middle-aged Caucasian lady currently not in distress. VITAL SIGNS:  Afebrile, temperature 98.3, blood pressure 155/91, respiratory rate 20 per minute,  oxygen sat 96%, pulse rate 73 per minute and regular sinus. HEAD:  Nontraumatic. NECK:  Supple without bruit.  Hearing is normal. CARDIAC:  No murmur or gallop. LUNGS:  Clear to auscultation. NEUROLOGIC:  She is awake, alert, oriented to time, place, and person. Speech and language appeared normal.  Eye movements are full range without nystagmus.  Visual acuity and fields seem adequate.  Face is symmetric.  Tongue is midline.  Motor system exam reveals no upper or lower extremity drift, symmetric equal strength in all 4 extremities. Finger-to-nose neutral coordination accurate.  She has mild subjective decreased sensation around the left lip and cheek area only.  Gait was not tested.  DATA REVIEWED:  MRI scan of the brain done today reveals no acute infarct, mild changes of small vessel disease.  MRA of the brain shows moderate right proximal posterior cerebral artery stenosis.  Hemoglobin A1c 7.1, TSH is 1.6, potassium is low at 3.3, white count is normal.  IMPRESSION:  A 70 year old lady with transient left hemibody paresthesias and some slurred speech, possibly right brain transient ischemic attacks due to small-vessel disease with multiple vascular risk factors of diabetes, hypertension, obesity.  PLAN:  I will recommend changing aspirin to Plavix for secondary stroke prevention; strict control of diabetes with hemoglobin A1c, goal below 6.5;  and hypertension with blood pressure goal below 120/80.  Check carotid Doppler, transcranial Doppler, echocardiogram.  I will be happy to follow the patient in consult.  Kindly call for questions.     [name] P. Pearlean Brownie, MD     PPS/MEDQ  D:  03/19/2010  T:  03/20/2010  Job:  [id number]  Electronically Signed by Delia Heady MD on 04/03/2010 11:15:50 AM

## 2010-04-25 NOTE — Discharge Summary (Signed)
[name]             ACCOUNT NO.:  [id number]  MEDICAL RECORD NO.:  [id number]           PATIENT TYPE:  I  LOCATION:  1433                         FACILITY:  [facility]  PHYSICIAN:  Richarda Overlie, MD       DATE OF BIRTH:  [birth year]  DATE OF ADMISSION:  03/17/2010 DATE OF DISCHARGE:  03/22/2010                        DISCHARGE SUMMARY - REFERRING   PRIMARY CARE PHYSICIAN:  Juline Patch, MD  PRIMARY GASTROENTEROLOGIST:  Willis Modena, MD  DISCHARGE DIAGNOSES: 1. Transient ischemic attack. 2. Noncardiac chest pain. 3. Hypertension. 4. Borderline diabetes with a hemoglobin A1c of 7.1. 5. Obesity. 6. Depression. 7. Hypothyroidism. 8. Recent admission for ischemic colitis with recurrent in the right     in the proximal transverse colon.  PROCEDURES:  Lexiscan Myoview, negative for inducible ischemia.  MRI of the head shows no acute intracranial abnormality, moderate-to-severe stenosis of the proximal right posterior cerebral artery.  MRI of the brain, moderate-to-severe stenosis of the proximal right posterior cerebral artery.  Chest x-ray, no focal or worrisome acute cardiopulmonary symptoms.  CT of the head without contrast, no acute intracranial abnormality.  CT scan of the abdomen and pelvis shows improvement in edema in the distal transverse and proximal left colon. Mild edema in the right in the proximal transverse colon.  SUBJECTIVE:  This is a 70 year old female patient of [name] who presented to the ED with a chief complaint of nausea, vomiting, and abdominal pain.  The patient complained of periumbilical pain and right lower quadrant pain.  She was recently admitted for ischemic colitis affecting the transverse colon and the proximal descending colon in the beginning of February.  HOSPITAL COURSE: 1. Ischemic colitis, likely contributing to nausea, vomiting,     abdominal pain. She had findings on the CAT scan affecting the SMA     distribution upon  admission.  She did not have elevated white     count.  C difficile PCR was done and was found to be negative.  Dr.     Matthias [name] was also consulted and according to [name], the     patient would need a colonoscopy.  A colonoscopy was scheduled,     however, this had to be cancelled because of development of chest     pain and TIA like symptoms.  According to [name], the patient     has to be medically stabilized, a full colonoscopy can be     scheduled.  I feel that the patient's nausea, vomiting, abdominal     pain could also be secondary to mild gastroparesis and a trial of     Reglan p.o. before meals was discussed with the patient and the     patient is agreeable to initiate Reglan to see if her nausea     resolves.  She may also benefit from an outpatient gastric emptying     study. 2. Chest pain.  The patient described left-sided chest pain, 10/10 in     intensity, associated with left-sided numbness.  She was treated     with sublingual nitroglycerin.  Cardiac enzymes were cycled and  were found to be negative.  Cardiology was consulted and a stress     test Myoview was done, did not show any reversible ischemia.     Hypertension medications were changed to metoprolol and she was     switched from aspirin to Plavix. 3. TIA like symptoms.  [name] was consulted and the patient     was recommended to have a full stroke workup.  She had an MRI and a     CT of the brain that did not show any evidence of CVA.  A 2-D echo     was done that showed an EF of 55% to 60% without any regional wall     motion abnormalities that did show complete some grade 1 diastolic     dysfunction. 4. Carotid Doppler did not show any significant extracranial carotid     artery stenosis. 5. The patient has had no recurrence of her symptoms during this     hospitalization. 6. Diabetes.  The patient is on Onglyza in the outpatient setting.     She is recommended to discuss with  her primary care provider if she     needs to be on a sulfonylurea for better control of her diabetes.  DISCHARGE MEDICATIONS: 1. Plavix 75 mg p.o. daily. 2. Metoprolol 25 mg p.o. q.12. 3. Nitroglycerin sublingual under the tongue q.5 minutes x3. 4. Reglan 5 mg p.o. 3 times a day. 5. Azor 10/40 one tablet p.o. daily. 6. Bupropion XL 1 tablet p.o. daily. 7. Carbamazepine 1-2 tablets p.o. twice daily. 8. Fluticasone topical 1 application daily. 9. Levothyroxine 75 mcg p.o. daily. 10.Nexium 40 mg p.o. daily. 11.Onglyza 5 mg p.o. daily. 12.Vistaril 25 mg 1-2 tablets p.o. daily. 13.Vitamin D3 2000 units 1 tablet p.o. daily. 14.Zofran 4 mg p.o. q.6 p.r.n.     Richarda Overlie, MD     NA/MEDQ  D:  03/22/2010  T:  03/22/2010  Job:  [id number]  cc:   Juline Patch, M.D. Fax: [phone]  Willis Modena, MD Fax: [phone]  Electronically Signed by Richarda Overlie MD on 04/25/2010 08:24:17 PM

## 2010-05-06 ENCOUNTER — Ambulatory Visit: Payer: Medicare Other | Attending: Internal Medicine | Admitting: Physical Therapy

## 2010-05-06 DIAGNOSIS — R269 Unspecified abnormalities of gait and mobility: Secondary | ICD-10-CM | POA: Insufficient documentation

## 2010-05-06 DIAGNOSIS — I69998 Other sequelae following unspecified cerebrovascular disease: Secondary | ICD-10-CM | POA: Insufficient documentation

## 2010-05-06 DIAGNOSIS — I69919 Unspecified symptoms and signs involving cognitive functions following unspecified cerebrovascular disease: Secondary | ICD-10-CM | POA: Insufficient documentation

## 2010-05-06 DIAGNOSIS — IMO0001 Reserved for inherently not codable concepts without codable children: Secondary | ICD-10-CM | POA: Insufficient documentation

## 2010-05-06 DIAGNOSIS — M6281 Muscle weakness (generalized): Secondary | ICD-10-CM | POA: Insufficient documentation

## 2010-05-09 NOTE — Consult Note (Signed)
[name], [name]             ACCOUNT NO.:  [id number]  MEDICAL RECORD NO.:  [id number]           PATIENT TYPE:  I  LOCATION:  1235                         FACILITY:  [facility]  PHYSICIAN:  Pricilla Riffle, MD, FACCDATE OF BIRTH:  September 02, 1940  DATE OF CONSULTATION:  03/19/2010 DATE OF DISCHARGE:                                CONSULTATION   PRIMARY CARE PHYSICIAN:  Juline Patch, MD  PRIMARY CARDIOLOGIST:  None.  CHIEF COMPLAINT:  Chest pain.  HISTORY OF PRESENT ILLNESS:  [name] is a 70 year old female with no history of coronary artery disease.  She had onset of left-sided chest pain last p.m.  It reached to 10/10.  It did not change with deep inspiration.  She describes it as a pressure.  Prior to the chest pain, she had a severe headache, then the chest pain, then left-sided numbness that she describes as being from her toes up the left side of her body, all the way down to her fingers, and up into her face and head.  She says she had some difficulty forming words, but knew the word she wanted to say.  She was seen by Primary Care and transferred to the step-down unit.  She was given sublingual nitroglycerin x2 as well as IV nitroglycerin.  The chest pain lasted about 2 hours.  This a.m., she had similar symptoms, but without any increase in the numbness.  She had some residual numbness left over from yesterday, but at this time it is more like paresthesias.  This morning, the chest pain again lasted about 2 hours.  The patient does not remember any specific medical therapy that relieved it.  She still has mild residual numbness/paresthesias to the left lateral leg, mouth, and arm including the 4th and 5th toes and the 4th and 5th fingers on her left upper and lower extremities.  The chest pain was associated with shortness of breath and diaphoresis, but no nausea or vomiting.  She has never had chest pain like this before. She has a previous history of chest pain that was  different from this chest pain for which she got a cardiac catheterization, but she states it was fine.  Currently, she is resting more comfortably.  PAST MEDICAL HISTORY: 1. History of chest pain with a catheterization by Dr. Algie Coffer in the     early 2000 showing no significant CAD per the patient, no further     details available. 2. Diabetes. 3. Hypertension. 4. Obesity. 5. History of colon adenomas as well as a hiatal hernia. 6. Family history of coronary artery disease. 7. Hypothyroidism. 8. Ischemic colitis. 9. Cholelithiasis.  SURGICAL HISTORY:  She is status post colonoscopies as well as appendectomy and hysterectomy.  ALLERGIES:  She is allergic or intolerant to, 1. METFORMIN. 2. CIPRO. 3. FLAGYL.  CURRENT MEDICATIONS: 1. Norvasc 10 mg a day. 2. Aspirin 325 mg a day. 3. Wellbutrin 150 mg daily. 4. Tegretol 200 mg a.m. and 400 mg p.m. 5. Vitamin D3 2000 units a day. 6. DVT Lovenox. 7. Sliding scale insulin. 8. Synthroid 75 mcg daily. 9. Lopressor 25 mg q.6 h. 10.Benicar 40 mg  a day. 11.Protonix 80 mg a day. 12.K-Dur p.r.n.  SOCIAL HISTORY:  She lives alone in [place].  She is a retired high school Retail buyer.  She denies any history of alcohol, tobacco, or drug abuse.  FAMILY HISTORY:  Her father died at 80 with no heart disease and her mother is alive at age 91 with a history of heart disease, but not prior to the age 51.  She has 5 sisters and none of them have any cardiac issues.  REVIEW OF SYSTEMS:  Her abdominal pain as well as nausea, vomiting, and diarrhea have resolved. The diarrhea did have some blood in it.  She has not had fever.  She does not have any history of chest pain with exertion, but admits that she does not exercise regularly.  She has no problems with edema.  Full 14-point review of systems is otherwise negative except as stated in the HPI.  PHYSICAL EXAMINATION:  VITAL SIGNS:  Temperature is 98.3, blood pressure 155/91,  heart rate 78, respiratory rate 15, O2 saturation 96% on O2. GENERAL:  She is a well-developed, obese, white female, in no acute distress. HEENT:  Normal. NECK:  There is no lymphadenopathy, thyromegaly, bruit, or JVD noted. CV:  Heart is regular rate and rhythm with an S1 and S2 and a faint systolic murmur noted at the left upper sternal border.  Distal pulses are intact in all 4 extremities. ABDOMEN:  Soft and nontender with active bowel sounds. EXTREMITIES:  There is no cyanosis, clubbing, or edema noted. SKIN:  No rashes or lesions are noted. MUSCULOSKELETAL:  There is no joint deformity or effusions and no spine or CVA tenderness. NEUROLOGIC:  She is alert and oriented.  Cranial nerves II through XII grossly intact except for a slight decrease in her left-sided facial mobility and slight left-sided weakness in upper and lower extremities.  EKG, sinus rhythm rate 80 with no acute ischemic changes.  LABORATORY VALUES:  Hemoglobin of 12.8, hematocrit 38, WBCs 5.2, platelets 318.  Sodium 137, potassium 3.3, chloride 107, CO2 of 26, BUN 5, creatinine 0.68, glucose 126.  Cardiac enzymes negative x3.  A 2-D echocardiogram, the results are pending, but a preliminary report indicated a normal EF with no critical valvular abnormalities.  Chest x-ray, no focal or worrisome acute cardiopulmonary abnormality.  MRI of the brain and MRA of the brain without contrast showed moderate- to-severe stenosis of the proximal right posterior cerebral artery, otherwise negative.  IMPRESSION:  [name] was seen today by Dr. Dietrich Pates, the patient evaluated and the data reviewed.  She had 2 episodes of chest pain since yesterday which she has not had before.  It was preceded by a headache and associated with left-sided numbness and weakness.  The stroke service is seeing the patient to evaluate these symptoms.  She is currently asymptomatic from a cardiac standpoint and has ruled out for MI by  serial cardiac enzymes.  Her EKG is negative as well.  RECOMMENDATIONS:  Given her recent neuro issues and no clear-cut ischemia, we recommend a Lexiscan Myoview prior to discharge to rule out a large area of ischemia.  This will be cleared with Neurology first. She should be continued on aspirin.  We would check a lipid profile and she should be started on a statin.  She is on diltiazem and Norvasc for her blood pressure, but she would benefit more from discontinuing the Cardizem and switching to Lopressor which has been done.  We will continue to follow  her.     Theodore Demark, PA-C   ______________________________ Pricilla Riffle, MD, [facility]    RB/MEDQ  D:  03/19/2010  T:  03/19/2010  Job:  [id number]  Electronically Signed by Theodore Demark PA-C on 04/06/2010 06:39:57 AM Electronically Signed by Dietrich Pates MD FACC on 05/09/2010 10:08:18 PM

## 2010-05-14 ENCOUNTER — Ambulatory Visit: Payer: Medicare Other | Admitting: Physical Therapy

## 2010-05-18 ENCOUNTER — Ambulatory Visit: Payer: Medicare Other | Admitting: Physical Therapy

## 2010-05-19 ENCOUNTER — Ambulatory Visit: Payer: Medicare Other | Admitting: Physical Therapy

## 2010-05-19 ENCOUNTER — Ambulatory Visit: Payer: Medicare Other | Attending: Internal Medicine | Admitting: Occupational Therapy

## 2010-05-19 DIAGNOSIS — IMO0001 Reserved for inherently not codable concepts without codable children: Secondary | ICD-10-CM | POA: Insufficient documentation

## 2010-05-19 DIAGNOSIS — I69919 Unspecified symptoms and signs involving cognitive functions following unspecified cerebrovascular disease: Secondary | ICD-10-CM | POA: Insufficient documentation

## 2010-05-19 DIAGNOSIS — I69998 Other sequelae following unspecified cerebrovascular disease: Secondary | ICD-10-CM | POA: Insufficient documentation

## 2010-05-19 DIAGNOSIS — M6281 Muscle weakness (generalized): Secondary | ICD-10-CM | POA: Insufficient documentation

## 2010-05-19 DIAGNOSIS — R269 Unspecified abnormalities of gait and mobility: Secondary | ICD-10-CM | POA: Insufficient documentation

## 2010-05-21 ENCOUNTER — Other Ambulatory Visit: Payer: Self-pay | Admitting: Internal Medicine

## 2010-05-21 ENCOUNTER — Ambulatory Visit
Admission: RE | Admit: 2010-05-21 | Discharge: 2010-05-21 | Disposition: A | Discharge status: Home / Self Care | Payer: Medicare Other | Source: Ambulatory Visit | Attending: Internal Medicine | Admitting: Internal Medicine

## 2010-05-21 ENCOUNTER — Ambulatory Visit: Payer: Medicare Other | Admitting: Physical Therapy

## 2010-05-21 DIAGNOSIS — R4182 Altered mental status, unspecified: Secondary | ICD-10-CM

## 2010-05-25 ENCOUNTER — Ambulatory Visit: Payer: Medicare Other | Admitting: Occupational Therapy

## 2010-05-25 ENCOUNTER — Ambulatory Visit: Payer: Medicare Other | Admitting: Physical Therapy

## 2010-05-27 ENCOUNTER — Ambulatory Visit: Payer: Medicare Other | Admitting: Physical Therapy

## 2010-05-27 ENCOUNTER — Ambulatory Visit: Payer: Medicare Other | Admitting: Occupational Therapy

## 2010-06-01 ENCOUNTER — Ambulatory Visit: Payer: Medicare Other | Admitting: Physical Therapy

## 2010-06-01 ENCOUNTER — Encounter: Payer: Medicare Other | Admitting: Occupational Therapy

## 2010-06-02 NOTE — Op Note (Signed)
[name], [name]             ACCOUNT NO.:  [id number]   MEDICAL RECORD NO.:  [id number]          PATIENT TYPE:  AMB   LOCATION:  ENDO                         FACILITY:  [facility]   PHYSICIAN:  Georgiana Spinner, M.D.    DATE OF BIRTH:  Aug 31, 1940   DATE OF PROCEDURE:  12/20/2006  DATE OF DISCHARGE:                               OPERATIVE REPORT   PROCEDURE:  Colonoscopy with polypectomy and biopsy.   INDICATIONS:  Colon polyps, rectal bleeding.   ANESTHESIA:  Fentanyl 150 mcg, Versed 15 mg.   PROCEDURE:  With the patient mildly sedated in the left lateral  decubitus position, the Pentax videoscopic colonoscope was inserted in  the rectum, passed with pressure to the cecum with pressure applied.  The patient had to be turned to her back, to her right side, back again,  the scope had to be pulled back a number of times but we finally were  able to reach the cecum as identified by ileocecal valve and base of  cecum both of which were photographed.  From this point the colonoscope  was slowly withdrawn taking circumferential views of colonic mucosa  stopping in the ascending colon near what was felt to be the hepatic  flexure where a polyp was seen, photographed, and removed using snare  cautery technique with a setting of 20/150 blended current.  It was  clear when we did this that some of the polyp remained so we had to  withdraw this with a Lucina Mellow retrieval net and retrieved this polyp and we  reinserted the endoscope back to this level again with pressure applied  and we used a snare to remove the remainder of the polyp. However, the  nurse pulled through this remainder of polyp with hot cautery and we had  some bleeding.  I elected then to inject 2 mL of epinephrine into the  site and bleeding ceased.  The endoscope was then withdrawn taking  circumferential views remaining colonic mucosa stopping at approximately  30 cm from anal verge at which point a small polyp was seen.  It too  was  photographed and it was removed using hot biopsy forceps technique  setting again 20/150 blended current.  The endoscope was pulled back to  the rectum which appeared normal on direct showed hemorrhoids on  retroflexed view.  The endoscope was straightened and withdrawn.  The  patient's vital signs, pulse oximeter remained stable.  The patient  tolerated procedure well without apparent complication.   FINDINGS:  Polyp of what appeared to be the ascending colon and one at  30 cm from anal verge.  Await biopsy reports.  The patient will call me  for results and follow-up with me as needed as an outpatient.           ______________________________  Georgiana Spinner, M.D.     GMO/MEDQ  D:  12/20/2006  T:  12/20/2006  Job:  [id number]

## 2010-06-02 NOTE — Op Note (Signed)
[name], MCDEVITT             ACCOUNT NO.:  [id number]   MEDICAL RECORD NO.:  [id number]          PATIENT TYPE:  AMB   LOCATION:  DAY                          FACILITY:  [facility]   PHYSICIAN:  Thomas A. Cornett, M.D.DATE OF BIRTH:  Aug 14, 1940   DATE OF PROCEDURE:  02/22/2007  DATE OF DISCHARGE:                               OPERATIVE REPORT   PREOPERATIVE DIAGNOSIS:  Grade 3 internal/external hemorrhoids.   POSTOPERATIVE DIAGNOSIS:  Grade 3 internal/external hemorrhoids.   PROCEDURE:  Internal/external hemorrhoidectomy, complex.   SURGEON:  Maisie Fus A. Cornett, M.D.   ANESTHESIA:  General endotracheal anesthesia with 20 mL of 0.25%  Sensorcaine local.   ESTIMATED BLOOD LOSS:  20 mL.   SPECIMEN:  Hemorrhoidal tissue to pathology.   DRAINS:  None.   INDICATIONS FOR PROCEDURE:  The patient is a 70 year old female who has  had a history of rectal bleeding and pain.  She had a very large column  of internal and external hemorrhoids, specifically on her right  posterior column.  These were not amendable to injection or banding and  I felt hemorrhoidectomy, given the internal and external component,  would be the best source of action over stapled hemorrhoidectomy.  She  presents today for that.   DESCRIPTION OF PROCEDURE:  The patient was brought to the operating room  and placed supine.  After induction of general anesthesia, she was then  placed in a prone jackknife position and appropriately padded.  Her  buttocks were taped apart.  The anal canal was prepped and draped in a  sterile fashion.  Rectal examination was done and this was normal.  She  had a very large cluster of right posterior column internal and external  hemorrhoids which were grade 3.  The anoscope was placed.  I placed a  stitch in the apex of these hemorrhoids approximately 3-4 cm above the  dentate line.  We then excised the internal and external component with  curved Mayo scissors, taking care to not  injure the sphincter mechanism.  I then closed the mucosa down to the dentate line with 3-0 Monocryl.  There was some additional hemorrhoidal tissue just anterior to that and  I trimmed this back, as well.  The remainder of the examination revealed  no significant evidence of grade 3 hemorrhoids.  Her left side had very  minimal disease and I did not feel anything needed to be done to that at  this point in time.  Hemostasis was excellent.  Irrigation was  used and suctioned out.  Packing consisting of Gelfoam and Surgicel was  placed in the anal canal, an ABD pad was placed, and mesh panties were  placed.  The patient was then placed supine, extubated, and taken to  recovery in satisfactory condition.  All final counts of sponge, needle  and instruments were found to be correct.      Thomas A. Cornett, M.D.  Electronically Signed     TAC/MEDQ  D:  02/22/2007  T:  02/22/2007  Job:  [id number]   cc:   Georgiana Spinner, M.D.  Fax: [phone]   Gerlene Burdock  Ricki Miller, M.D.  Fax: [phone]

## 2010-06-03 ENCOUNTER — Ambulatory Visit: Payer: Medicare Other | Admitting: Occupational Therapy

## 2010-06-03 ENCOUNTER — Ambulatory Visit: Payer: Medicare Other | Admitting: Physical Therapy

## 2010-06-08 ENCOUNTER — Ambulatory Visit: Payer: Medicare Other | Admitting: Physical Therapy

## 2010-06-08 ENCOUNTER — Ambulatory Visit: Payer: Medicare Other | Admitting: Occupational Therapy

## 2010-06-09 NOTE — Op Note (Signed)
   NAMEKELBY, LOTSPEICH                       ACCOUNT NO.:  [id number]   MEDICAL RECORD NO.:  [id number]                   PATIENT TYPE:  OIB   LOCATION:  2870                                 FACILITY:  [facility]   PHYSICIAN:  Beulah Gandy. Ashley Royalty, M.D.              DATE OF BIRTH:  05/10/1940   DATE OF PROCEDURE:  12/07/2001  DATE OF DISCHARGE:                                 OPERATIVE REPORT   PREOPERATIVE DIAGNOSIS:  Rhegmatogenous retinal detachment, right eye.   POSTOPERATIVE DIAGNOSIS:  Rhegmatogenous retinal detachment, right eye.   PROCEDURE:  Scleral buckle, gas fluid exchange, retinal photocoagulation,  right eye.   SURGEON:  Beulah Gandy. Ashley Royalty, M.D.   ASSISTANT:  Merian Capron, M.A.   ANESTHESIA:  General.   DETAILS:  The usual prep and drape.  360 degree limbal peritomy.  Isolation  of four rectus muscles on 2-0 silk.  Localization of break in upper temporal  quadrant.  Scleral dissection from 2 o'clock around to 10 o'clock to permit  a #279 intrascleral implant.  Additional posterior dissection was carried  out at 10 o'clock to support the retinal break.  Some cryopexy was placed in  the posterior dissection at 10 o'clock.  Diathermy was placed in the bed.  Two scleral sutures per quadrant were placed in the scleral flaps.  The 279  implant was placed against the globe and trimmed.  The 508G radial segment  was placed beneath the break at 10 o'clock.  Perforation site chosen at 8  o'clock in the posterior aspect of the bed.  A large amount of clear,  colorless subretinal fluid came forth.  The scleral flaps were closed, the  sutures knotted, and the free ends removed.  The buckle was adjusted and  trimmed.  The band was adjusted and trimmed.  The 270 sleeve was at 2  o'clock.  The conjunctiva was reapproximated with 7-0 chromic suture.  Polymixin and gentamicin were irrigated into tenon space, Atropine solution  was applied.  Marcaine was injected around the globe for  postop pain.  Decadron 10 mg was injected into the lower subconjunctival space.  Paracentesis x 2 obtained a closing tension of 15 with the [employer].  Polysporin, a patch and shield were placed.  The patient was  awakened and taken to the recovery room in satisfactory condition.  Complications none.  Duration 2 hours.                                               Beulah Gandy. Ashley Royalty, M.D.    JDM/MEDQ  D:  12/07/2001  T:  12/07/2001  Job:  [id number]

## 2010-06-10 ENCOUNTER — Ambulatory Visit: Payer: Medicare Other | Admitting: Physical Therapy

## 2010-06-10 ENCOUNTER — Encounter: Payer: Medicare Other | Admitting: Occupational Therapy

## 2010-06-16 ENCOUNTER — Ambulatory Visit: Payer: Medicare Other | Admitting: Physical Therapy

## 2010-06-16 ENCOUNTER — Encounter: Payer: Medicare Other | Admitting: Occupational Therapy

## 2010-06-18 ENCOUNTER — Encounter: Payer: Medicare Other | Admitting: Occupational Therapy

## 2010-06-18 ENCOUNTER — Ambulatory Visit: Payer: Medicare Other | Admitting: Physical Therapy

## 2010-06-23 ENCOUNTER — Encounter: Payer: Medicare Other | Admitting: Occupational Therapy

## 2010-06-23 ENCOUNTER — Ambulatory Visit: Payer: Medicare Other | Admitting: Physical Therapy

## 2010-06-25 ENCOUNTER — Encounter: Payer: Medicare Other | Admitting: Occupational Therapy

## 2010-06-25 ENCOUNTER — Ambulatory Visit: Payer: Medicare Other | Admitting: Physical Therapy

## 2010-07-01 ENCOUNTER — Other Ambulatory Visit (INDEPENDENT_AMBULATORY_CARE_PROVIDER_SITE_OTHER): Payer: Self-pay | Admitting: Surgery

## 2010-07-01 ENCOUNTER — Encounter (HOSPITAL_COMMUNITY): Payer: Medicare Other

## 2010-07-01 ENCOUNTER — Other Ambulatory Visit: Payer: Self-pay | Admitting: Anesthesiology

## 2010-07-01 LAB — SURGICAL PCR SCREEN
MRSA, PCR: NEGATIVE
Staphylococcus aureus: NEGATIVE

## 2010-07-01 LAB — COMPREHENSIVE METABOLIC PANEL
BUN: 14 mg/dL (ref 6–23)
Calcium: 10 mg/dL (ref 8.4–10.5)
Creatinine, Ser: 0.66 mg/dL (ref 0.4–1.2)
GFR calc Af Amer: >60 mL/min (ref >60)
GFR calc non Af Amer: >60 mL/min (ref >60)
Glucose, Bld: 127 mg/dL — ABNORMAL HIGH (ref 70–99)
Sodium: 133 mEq/L — ABNORMAL LOW (ref 135–145)
Total Protein: 7.2 g/dL (ref 6.0–8.3)

## 2010-07-01 LAB — CBC
HCT: 41.3 % (ref 36.0–46.0)
Hemoglobin: 14.2 g/dL (ref 12.0–15.0)
MCH: 29.5 pg (ref 26.0–34.0)
MCHC: 34.4 g/dL (ref 30.0–36.0)
MCV: 85.9 fL (ref 78.0–100.0)

## 2010-07-08 ENCOUNTER — Inpatient Hospital Stay (HOSPITAL_COMMUNITY): Payer: Medicare Other

## 2010-07-08 ENCOUNTER — Ambulatory Visit (HOSPITAL_COMMUNITY): Payer: Medicare Other

## 2010-07-08 ENCOUNTER — Inpatient Hospital Stay (HOSPITAL_COMMUNITY)
Admission: RE | Admit: 2010-07-08 | Discharge: 2010-07-10 | DRG: 069 | Disposition: A | Discharge status: Home / Self Care | Payer: Medicare Other | Source: Ambulatory Visit | Attending: Internal Medicine | Admitting: Internal Medicine

## 2010-07-08 DIAGNOSIS — Z8673 Personal history of transient ischemic attack (TIA), and cerebral infarction without residual deficits: Secondary | ICD-10-CM

## 2010-07-08 DIAGNOSIS — G459 Transient cerebral ischemic attack, unspecified: Principal | ICD-10-CM | POA: Diagnosis present

## 2010-07-08 DIAGNOSIS — Z5309 Procedure and treatment not carried out because of other contraindication: Secondary | ICD-10-CM

## 2010-07-08 DIAGNOSIS — E119 Type 2 diabetes mellitus without complications: Secondary | ICD-10-CM | POA: Diagnosis present

## 2010-07-08 DIAGNOSIS — F329 Major depressive disorder, single episode, unspecified: Secondary | ICD-10-CM | POA: Diagnosis present

## 2010-07-08 DIAGNOSIS — F3289 Other specified depressive episodes: Secondary | ICD-10-CM | POA: Diagnosis present

## 2010-07-08 DIAGNOSIS — K8020 Calculus of gallbladder without cholecystitis without obstruction: Secondary | ICD-10-CM | POA: Diagnosis present

## 2010-07-08 DIAGNOSIS — I10 Essential (primary) hypertension: Secondary | ICD-10-CM | POA: Diagnosis present

## 2010-07-08 DIAGNOSIS — K219 Gastro-esophageal reflux disease without esophagitis: Secondary | ICD-10-CM | POA: Diagnosis present

## 2010-07-08 DIAGNOSIS — F068 Other specified mental disorders due to known physiological condition: Secondary | ICD-10-CM | POA: Diagnosis present

## 2010-07-08 DIAGNOSIS — Z7902 Long term (current) use of antithrombotics/antiplatelets: Secondary | ICD-10-CM

## 2010-07-08 DIAGNOSIS — E785 Hyperlipidemia, unspecified: Secondary | ICD-10-CM | POA: Diagnosis present

## 2010-07-08 DIAGNOSIS — Z6841 Body Mass Index (BMI) 40.0 and over, adult: Secondary | ICD-10-CM

## 2010-07-08 DIAGNOSIS — Z01812 Encounter for preprocedural laboratory examination: Secondary | ICD-10-CM

## 2010-07-08 DIAGNOSIS — I672 Cerebral atherosclerosis: Secondary | ICD-10-CM | POA: Diagnosis present

## 2010-07-08 DIAGNOSIS — E039 Hypothyroidism, unspecified: Secondary | ICD-10-CM | POA: Diagnosis present

## 2010-07-08 DIAGNOSIS — Z79899 Other long term (current) drug therapy: Secondary | ICD-10-CM

## 2010-07-08 LAB — DIFFERENTIAL
Basophils Absolute: 0 10*3/uL (ref 0.0–0.1)
Eosinophils Absolute: 0.1 10*3/uL (ref 0.0–0.7)
Eosinophils Relative: 2 % (ref 0–5)
Monocytes Absolute: 0.8 10*3/uL (ref 0.1–1.0)

## 2010-07-08 LAB — URINALYSIS, ROUTINE W REFLEX MICROSCOPIC
Bilirubin Urine: NEGATIVE
Glucose, UA: NEGATIVE mg/dL
Nitrite: NEGATIVE
Specific Gravity, Urine: 1.013 (ref 1.005–1.030)
pH: 8 (ref 5.0–8.0)

## 2010-07-08 LAB — TROPONIN I: Troponin I: <0.3 ng/mL (ref <0.30)

## 2010-07-08 LAB — CBC
HCT: 36.9 % (ref 36.0–46.0)
HCT: 37.4 % (ref 36.0–46.0)
Hemoglobin: 13 g/dL (ref 12.0–15.0)
MCHC: 35 g/dL (ref 30.0–36.0)
MCV: 86.6 fL (ref 78.0–100.0)
Platelets: 270 10*3/uL (ref 150–400)
RBC: 4.3 MIL/uL (ref 3.87–5.11)
RDW: 12.2 % (ref 11.5–15.5)
WBC: 5.6 10*3/uL (ref 4.0–10.5)

## 2010-07-08 LAB — COMPREHENSIVE METABOLIC PANEL
ALT: 26 U/L (ref 0–35)
ALT: 26 U/L (ref 0–35)
AST: 25 U/L (ref 0–37)
AST: 24 U/L (ref 0–37)
Albumin: 3.8 g/dL (ref 3.5–5.2)
Albumin: 3.8 g/dL (ref 3.5–5.2)
CO2: 28 mEq/L (ref 19–32)
Calcium: 9.3 mg/dL (ref 8.4–10.5)
Calcium: 9.3 mg/dL (ref 8.4–10.5)
Chloride: 100 mEq/L (ref 96–112)
Creatinine, Ser: 0.55 mg/dL (ref 0.50–1.10)
GFR calc Af Amer: >60 mL/min (ref >60)
Potassium: 3.6 mEq/L (ref 3.5–5.1)
Sodium: 137 mEq/L (ref 135–145)
Sodium: 138 mEq/L (ref 135–145)
Total Protein: 6.5 g/dL (ref 6.0–8.3)

## 2010-07-08 LAB — APTT
aPTT: 35 seconds (ref 24–37)
aPTT: 38 seconds — ABNORMAL HIGH (ref 24–37)

## 2010-07-08 LAB — POCT I-STAT, CHEM 8
Calcium, Ion: 1.12 mmol/L (ref 1.12–1.32)
Creatinine, Ser: 0.7 mg/dL (ref 0.50–1.10)
Glucose, Bld: 121 mg/dL — ABNORMAL HIGH (ref 70–99)
HCT: 39 % (ref 36.0–46.0)
Hemoglobin: 13.3 g/dL (ref 12.0–15.0)

## 2010-07-08 LAB — CARDIAC PANEL(CRET KIN+CKTOT+MB+TROPI)
CK, MB: 3.2 ng/mL (ref 0.3–4.0)
Relative Index: INVALID (ref 0.0–2.5)
Total CK: 66 U/L (ref 7–177)
Troponin I: <0.3 ng/mL (ref <0.30)

## 2010-07-08 LAB — CK TOTAL AND CKMB (NOT AT ARMC)
Relative Index: INVALID (ref 0.0–2.5)
Total CK: 71 U/L (ref 7–177)

## 2010-07-08 LAB — PROTIME-INR
INR: 1.06 (ref 0.00–1.49)
INR: 1.08 (ref 0.00–1.49)

## 2010-07-08 LAB — GLUCOSE, CAPILLARY: Glucose-Capillary: 134 mg/dL — ABNORMAL HIGH (ref 70–99)

## 2010-07-08 LAB — URINE MICROSCOPIC-ADD ON

## 2010-07-08 LAB — HEMOGLOBIN A1C: Hgb A1c MFr Bld: 6.9 % — ABNORMAL HIGH (ref <5.7)

## 2010-07-08 MED ORDER — GADOBENATE DIMEGLUMINE 529 MG/ML IV SOLN
20.0000 mL | Freq: Once | INTRAVENOUS | Status: AC | PRN
  Administered 2010-07-08: 20 mL via INTRAVENOUS

## 2010-07-09 ENCOUNTER — Inpatient Hospital Stay (HOSPITAL_COMMUNITY): Payer: Medicare Other

## 2010-07-09 ENCOUNTER — Inpatient Hospital Stay (HOSPITAL_COMMUNITY)
Admit: 2010-07-09 | Discharge: 2010-07-09 | Disposition: A | Discharge status: Home / Self Care | Payer: Medicare Other | Attending: Internal Medicine | Admitting: Internal Medicine

## 2010-07-09 DIAGNOSIS — M79609 Pain in unspecified limb: Secondary | ICD-10-CM

## 2010-07-09 DIAGNOSIS — G459 Transient cerebral ischemic attack, unspecified: Secondary | ICD-10-CM

## 2010-07-09 LAB — COMPREHENSIVE METABOLIC PANEL
CO2: 28 mEq/L (ref 19–32)
Calcium: 9.3 mg/dL (ref 8.4–10.5)
Creatinine, Ser: 0.56 mg/dL (ref 0.50–1.10)
GFR calc Af Amer: >60 mL/min (ref >60)
GFR calc non Af Amer: >60 mL/min (ref >60)
Glucose, Bld: 132 mg/dL — ABNORMAL HIGH (ref 70–99)

## 2010-07-09 LAB — CBC
HCT: 38.7 % (ref 36.0–46.0)
MCHC: 33.6 g/dL (ref 30.0–36.0)
MCV: 87.6 fL (ref 78.0–100.0)
Platelets: 251 10*3/uL (ref 150–400)
RDW: 12 % (ref 11.5–15.5)

## 2010-07-09 LAB — LIPID PANEL
Cholesterol: 187 mg/dL (ref 0–200)
Triglycerides: 113 mg/dL (ref <150)

## 2010-07-09 LAB — PROTIME-INR: INR: 1.01 (ref 0.00–1.49)

## 2010-07-09 LAB — GLUCOSE, CAPILLARY: Glucose-Capillary: 118 mg/dL — ABNORMAL HIGH (ref 70–99)

## 2010-07-09 NOTE — Consult Note (Signed)
[name], [name] NO.:  [id number]  MEDICAL RECORD NO.:  [id number]  LOCATION:  1407                         FACILITY:  [facility]  PHYSICIAN:  Thana Farr, MD    DATE OF BIRTH:  01/22/1940  DATE OF CONSULTATION:  07/08/2010 DATE OF DISCHARGE:                                CONSULTATION   REFERRING PHYSICIAN:  Dr. Terressa Koyanagi.  HISTORY:  [name] is a 70 year old female that reports that on her way to gallbladder surgery today, she began to notice numbness in the left toes on her left lower extremity.  The numbness progressed upward on the lateral aspect of her left lower extremity and into her left arm involving mostly the left pinky.  Patient also had some tingling on the side of her face and reported a heaviness in her head.  Patient reports that this was similar to her symptoms that she presented with TIA in March of this year that was diagnosed as a TIA.  Patient was brought into the ED as a code stroke.  Patient reports that on March 19, 2010, she had similar symptoms, although they were much worse.  They were accompanied by weakness on the left and difficulty with speech.  It seems that at discharge, the patient was felt to be back to baseline and was diagnosed with a TIA. She then reports 1-2 months later, she developed left-sided weakness. She reports that no further workup was done at that time, but she was felt to have had a stroke and was sent for therapy.  She reports that with therapy she was getting some stronger on the left and then began to experience her abdominal complaints.  She is now here for surgery.  She has been taken off her Plavix since June 19, 2010 in preparation for surgery.  Workup in the past included MRI of the brain with her initial event that was felt to be a TIA that was unremarkable.  PAST MEDICAL HISTORY: 1. Depression. 2. Hypothyroidism. 3. Hypertension. 4. Gallbladder disease. 5. Ischemic  colitis.  MEDICATIONS: 1. Vicodin. 2. Zofran. 3. Nitroglycerin. 4. Multivitamin. 5. Colace. 6. Vitamin D3. 7. Nexium. 8. Vistaril. 9. Levothyroxine. 10.Reglan. 11.Plavix. 12.Lopressor. 13.Cardizem. 14.Azor. 15.Bupropion. 16.Tegretol.  ALLERGIES: 1. CIPRO. 2. METFORMIN. 3. FLAGYL.  SOCIAL HISTORY:  The patient has no history of alcohol, tobacco, or illicit drug abuse.  PHYSICAL EXAMINATION:  VITAL SIGNS:  Blood pressure 154/89, heart rate 69, respiratory rate 16, temperature 97.6, O2 sat 97% on room air. MENTAL STATUS TESTING:  Patient is alert and oriented x3.  She can follow commands without difficulty.  Speech is fluent.  Patient reports that there is some slurred speech, but I am unable to appreciate this on exam. CRANIAL NERVE TESTING:  II:  Visual fields grossly intact.  With continued examination, the patient does report she has some blurring out of the left eye.  There is no field cut.  III, IV, VI:  Extraocular movements intact.  V and VII:  Left facial droop with decreased pinprick on the left side of the face.  VIII:  Grossly intact.  IX and X:Positive gag.  XI:  Bilateral shoulder shrug.  XII:  Midline tongue  extension. MOTOR EXAMINATION:  The patient has a drift down with the left upper extremity without pronation.  She is otherwise 5/5 on both the right upper and lower extremity.  She gives 4+ to 5-/5 strength in both the right upper and the right lower extremity. SENSORY TESTING:  There is decreased pinprick and light touch on the lateral aspect of the left lower extremity.  It is otherwise intact in the medial aspect of the left lower extremity and on the right lower extremity.  There is decreased pinprick on the medial aspect of the left upper extremity and sensation is intact on the right upper extremity. Deep tendon reflexes are 1+ in the upper extremities and at the knees and absent at the ankles.  Plantars mute on the left and upgoing on  the right. CEREBELLAR TESTING:  Finger-to-nose and heel-to-shin intact.  LABORATORY DATA:  Shows a sodium of 138, potassium of 3.9, chloride 101, bicarb 27, BUN and creatinine 13 and 0.70, glucose 121.  White blood cell count 5.6, platelet count 270,000, hemoglobin and hematocrit 12.9 and 36.9 respectively.  PT 14.0, INR 1.06, PTT 35.  DIAGNOSTIC STUDIES:  CT of the head reviewed and was unremarkable.  ASSESSMENT:  [name] is a 70 year old female with an unusual symptom constellation.  It is unclear if the patient has actually had an infarct.  She does have multiple small vessel disease risk factors and has been off her Plavix for surgery.  Current NIH stroke scale is 5. Her symptoms are actually mild on exam though and have improved since initial presentation.  Due to the mild degree of symptomatology, improvement in symptoms, and the high possibility that she may have had an acute stroke within the 64-month window, would not start TPA at this time.  Further workup is indicated.  PLAN: 1. ESR. 2. MRI of the brain. 3. Echo and carotid. 4. EEG. 5. Restart Plavix. 6. Conversation to be held with surgeon about timing for gallbladder     surgery. 7. Continued PT.  Patient is considered critically ill.  She has not returned to baseline and has a high possibility of further neurologic decompensation.  High level decision making was required in the care of the patient.  90 minutes was spent in face-to-face management  and care of the patient.           ______________________________ Thana Farr, MD     LR/MEDQ  D:  07/08/2010  T:  07/08/2010  Job:  [id number]  Electronically Signed by Thana Farr MD on 07/09/2010 05:45:31 PM

## 2010-07-10 LAB — GLUCOSE, CAPILLARY
Glucose-Capillary: 110 mg/dL — ABNORMAL HIGH (ref 70–99)
Glucose-Capillary: 138 mg/dL — ABNORMAL HIGH (ref 70–99)
Glucose-Capillary: 101 mg/dL — ABNORMAL HIGH (ref 70–99)

## 2010-07-10 NOTE — Procedures (Unsigned)
EEG NUMBER:  REFERRING PHYSICIAN:  Conley Canal, MD  HISTORY:  A 70 year old female with episodes of left-sided weakness examined to rule out seizure.  MEDICATIONS:  Protonix, Plavix, NovoLog, Colace, vitamins, Synthroid, Lopressor, diltiazem, Benicar, Norvasc, Wellbutrin, Carbatrol, Zofran.  CONDITIONS OF RECORDING:  This is a 16-channel EEG carried out with patient in the awake, drowsy, and asleep states.  DESCRIPTION:  The waking background activity consists of a low-voltage symmetrical fairly well-organized 10 Hz alpha activity seen from the parieto-occipital posterotemporal regions.  Low-voltage fast activity poorly organized, is seen in June at times, superimposed on more posterior rhythms.  A mixture of theta and alpha rhythm was seen from the central and temporal regions.  The patient drowses with slowing to irregular, low-voltage theta and beta activity.  The patient goes briefly into a light sleep with symmetrical sleep spindles and vertex until sharp activity and irregular slow activity.  Hyperventilation was not performed.  Intermittent photic stimulation were not performed.  IMPRESSION:  This is a normal EEG.          ______________________________ Thana Farr, MD    MW:UXLK D:  07/09/2010 16:41:43  T:  07/10/2010 01:06:59  Job #:  [id number]

## 2010-07-14 NOTE — H&P (Signed)
[name], [name]             ACCOUNT NO.:  [id number]  MEDICAL RECORD NO.:  [id number]  LOCATION:  1407                         FACILITY:  [facility]  PHYSICIAN:  Conley Canal, MD      DATE OF BIRTH:  [birth year]  DATE OF ADMISSION:  07/08/2010 DATE OF DISCHARGE:  07/01/2010                             HISTORY & PHYSICAL   PRIMARY CARE PHYSICIAN:  Dr. Juline Patch.  GASTROENTEROLOGIST:  Dr. Willis Modena.  GENERAL SURGEON:  Dr. Abigail Miyamoto.  CHIEF COMPLAINT:  Numbness involving left arm and left leg, which started today.  HISTORY OF PRESENT ILLNESS:  [name] is a pleasant 70 year old morbidly obese female who is also diabetic type 2 with history of depression, hypertension, hypothyroidism, history of retinal detachment, gastroesophageal reflux disease, and hyperlipidemia who came in today from the PACU after she reported some numbness involving the left arm and the left leg associated with a sense of fullness of the head and some numbness of the lips as well as dysarthria.  At the time of my evaluation, the symptoms have essentially resolved.  The patient was worked up for a TIA at the beginning of March and at that time, she had an MRI of the brain, which showed moderate-to-severe stenosis of the proximal right posterior cerebral artery.  Otherwise, no other findings on MRI.  She also had carotid duplex and 2-D echocardiogram, which were both unrevealing.  She states that after she came out of the hospital, she continued to have weakness involving the left side, but this improved with physical therapy.  At this time around, she was taken off Plavix around June 1st in preparation for elective cholecystectomy, which has been put on hold because of the symptoms.  Otherwise, the patient denies any headache.  No fever.  She admits to some shortness of breath, but no chest pain and also some left calf pain.  She has been seen by Dr. Thad Ranger in the emergency room who  recommended workup for stroke and also perform an EEG and resume Plavix.  I have discussed with Dr. Magnus Ivan who is okay with the patient resuming Plavix, given the risk for stroke.  Otherwise, the patient feels better at this point. She had been doing well prior to these symptoms.  PAST MEDICAL HISTORY: 1. Diabetes mellitus, type 2. 2. History of TIA, CVA with left-sided weakness at the beginning of     March 2012. 3. Morbid obesity, BMI greater than 40. 4. Malignant hypertension. 5. Hyperlipidemia. 6. Hypothyroidism. 7. Depression.  ALLERGIES: 1. METFORMIN. 2. CIPROFLOXACIN. 3. FLAGYL.  FAMILY HISTORY:  Mother had acute myocardial infarction.  HOME MEDICATIONS:  Vistaril, Synthroid, Reglan, Plavix, Lopressor, Cardizem, Azor, bupropion, carbamazepine, Vicodin, Zofran, nitroglycerin, Colace, multivitamins, Nexium, and vitamin D3.  SOCIAL HISTORY:  The patient lives by herself.  She denies cigarette smoking, alcohol, or illicit drugs.  REVIEW OF SYSTEMS:  Unremarkable except as highlighted in the history of present illness.  PHYSICAL EXAMINATION:  GENERAL:  On examination, this is an elderly lady who is not in acute distress. VITAL SIGNS:  Blood pressure 160/90 and heart rate in the 60s.  She is febrile, oxygenating adequately, and respiratory rate around 16. HEAD, EARS, EYES,  NOSE, AND THROAT:  Pupils equal and reacting to light. No jugular venous distention.  No carotid bruits. RESPIRATORY SYSTEM:  Good air entry bilaterally with no rhonchi, rales, or wheezes. CARDIOVASCULAR SYSTEM:  First and second heart sounds are heard.  No murmurs.  Pulse regular. ABDOMEN:  Morbidly obese, otherwise soft and nontender.  No palpable organomegaly.  Bowel sounds normal. CNS:  The patient has power of 4/5, left upper extremity.  Normal reflexes, lower extremities.  No other focal deficits.  EXTREMITIES:  No pedal edema.  Some tenderness involving the left calf and some slight pedal  edema, left femoral side.  Peripheral pulses equal.  LABORATORY DATA:  Labs reviewed significant for CBC, normal.  Sodium 137, potassium 3.8, BUN 18, and creatinine 0.55.  Cardiac enzymes negative x1.  Alkaline phosphatase 127.  CT brain without contrast was normal.  EKG showed normal sinus rhythm with nonspecific ST-T wave changes.  IMPRESSION:  A 70 year old morbidly obese diabetic female with previous history of transient ischemic attack, who comes in with left-sided numbness and some weakness involving left upper extremity, which could be residual versus new event with concern for acute ischemic infarct. No hemorrhage.  The patient has been off Plavix in preparation for surgery.  PLAN: 1. Left-sided numbness and weakness, transient ischemic attack versus     acute ischemic infarct.  Appreciate Neurology input.  The patient     will be admitted to Telemetry for stroke workup.  We will obtain     MRI of the brain, 2-D echocardiogram, carotid duplex, and EEG.     Meanwhile, we will resume Plavix.  Continue other risk modification     with blood pressure control.  Plan to start statins.  We will ask     Physical Therapy and Speech Therapy to see. 2. Malignant hypertension.  We will resume blood pressure medications     including Cardizem, Azor, and Lopressor. 3. Hypothyroidism.  The patient is on Synthroid.  Continue with the     same. 4. Depression seems stable.  Continue bupropion and carbamazepine. 5. Symptomatic cholelithiasis for elective cholecystectomy once     clinically stable from a neurological standpoint. 6. DVT and GI prophylaxis. 7. The patient's condition is guarded. 8. Left leg pain and swelling.  We will obtain ultrasound of the same     leg, rule out deep venous thrombosis, suspicion low as the patient     has been ambulatory.     Conley Canal, MD     SR/MEDQ  D:  07/08/2010  T:  07/08/2010  Job:  [id number]  cc:   Juline Patch, M.D. Fax:  [phone]  Abigail Miyamoto, M.D. [address] [place] [address]  Dr. Thad Ranger.  Electronically Signed by Conley Canal  on 07/14/2010 10:21:28 AM

## 2010-07-21 ENCOUNTER — Encounter (INDEPENDENT_AMBULATORY_CARE_PROVIDER_SITE_OTHER): Payer: Self-pay | Admitting: Surgery

## 2010-07-21 NOTE — Discharge Summary (Signed)
NAMEMarland Kitchen  Mary, [name] NO.:  [id number]  MEDICAL RECORD NO.:  [id number]  LOCATION:  1407                         FACILITY:  [facility]  PHYSICIAN:  Gwenyth Bender, NP      DATE OF BIRTH:  1940-11-06  DATE OF ADMISSION:  07/08/2010 DATE OF DISCHARGE:  07/10/2010                        DISCHARGE SUMMARY - REFERRING   PRIMARY CARE PROVIDER:  Juline Patch, MD  GASTROENTEROLOGIST:  Willis Modena, MD  GENERAL SURGEON:  Abigail Miyamoto, MD  DISCHARGE DIAGNOSES: 1. Left-sided numbness, transient ischemic attack versus acute     ischemia. 2. Malignant hypertension. 3. Hypothyroidism. 4. Nausea/vomiting in the setting of known cholelithiasis. 5. Diabetes type 2. 6. Dementia. 7. Depression.  DISCHARGE MEDICATIONS: 1. Azor 10/40 tablet p.o. every morning. 2. Bupropion XL 150 mg p.o. every morning. 3. Carbatrol 200 mg 1 to 2 tablets p.o., 1 in the morning, 2 at night. 4. Cardizem CD 240 mg p.o. daily at bedtime. 5. Docusate 100 mg p.o. every morning. 6. Hydrocodone/acetaminophen 5/325 mg 1 tablet p.o. every 6 hours as     needed for pain. 7. Levothyroxine 75 mcg 1 tablet p.o. every morning. 8. Lopressor 25 mg p.o. b.i.d. 9. Multivitamin 1 tablet p.o. daily. 10.Nexium 40 mg p.o. every evening. 11.Nitroglycerin 0.4 mg sublingually 1 tablet every 5 minutes as     needed for chest pain up to 3 doses. 12.Plavix 75 mg p.o. daily. 13.Reglan 10 mg half a tab p.o. t.i.d. before meals. 14.Vistaril 25 mg 1 to 2 capsules p.o. daily at bedtime as needed for     severe itching. 15.Vitamin D3 2000 units 1 tablet p.o. every morning. 16.Zofran ODT 4 mg p.o. sublingually every 6 hours as needed for     nausea.  DIAGNOSTIC LABS:  Sodium 138, potassium 3.9, chloride 101, CO2 27, BUN 13, creatinine 0.70.  WBCs 5.6, hemoglobin 12.9, hematocrit 36.9. Glucose 117.  Alk phos 127, AST 25, ALT 26.  PT is 14.2, INR 1.08, PTT 38.  First set of cardiac enzymes yield a total CK of 66,  CK-MB 3.2, troponin-I less than 0.30.  Urinalysis shows small leukocytes, many bacteria, 3-6 WBCs.  Hemoglobin A1c is 6.9.  Magnesium 2.2.  Cholesterol 187, triglyceride 113, HDL 64, LDL 100.  TSH 1.301, vitamin B12 251. RPR is nonreactive.  DIAGNOSTIC IMAGING: 1. On June 20 CT of the head without contrast yields a normal CT scan     of the brain for age.  No evidence of a recent infarct or     intracranial hemorrhage. 2. Chest x-ray done on June 20 shows no active cardiopulmonary     abnormalities. 3. MRA of the head on June 21 yields anterior circulation without     large-vessel occlusion.  Branch vessel atherosclerotic type     changes.  Posterior circulation with moderate to marked narrowing     of the right posterior cerebral artery which has progressed     slightly since prior exam. 4. MRA of the neck without contrast yields no evidence of significant     stenosis involving the left carotid bifurcation.  1.5-cm above its     origin, there is focal kink of the left internal carotid artery  with narrowing.  No evidence of hemodynamically significant     stenosis involving the right carotid bifurcation.  Moderate tandem     stenosis of the ectatic proximal left vertebral artery.  At the C2     level, irregularity and ectasia of the left vertebral artery, may     be related to atherosclerotic type changes.  Mild-to-moderate     smooth narrowing proximal right vertebral artery. 5. MRI of the head yields no acute infarct.  No intracranial     hemorrhage.  Mild small-vessel disease type changes.  Mild global     atrophy without hydrocephalus. 6. MRA of the head done June 21 yields anterior circulation without     large-vessel occlusion.  Branch vessel atherosclerotic type     changes. 7. MRI of the lumbar spine yields mild multifactorial spinal and     lateral recess stenosis at L3-4 and L4-5.  Shallow right foraminal     disk protrusion at L5-S1 without direct neural compression  but     possible L5 nerve root irritation.  CONSULTATIONS:  Dr. Thana Farr with Neurology on June 20.  PROCEDURES:  EEG on June 21.  Normal EEG.  BRIEF HISTORY:  [name] is a 70 year old morbidly obese female with a history of diabetes, depression, hypertension, hypothyroidism, GERD, cholelithiasis, who came to [facility] on June 20 to undergo an elective cholecystectomy, when while in the holding area she reported numbness involving the left arm and left leg associated with a sense of fullness of the head and some numbness of the lips as well as dysarthria.  Her symptoms resolved by the time the [facility] was called to the holding area to evaluate.  Of note, the patient was worked up for a TIA at the beginning of March of this year and at that time she had an MRI of the brain which showed moderate-to-severe stenosis of the proximal right posterior cerebral artery.  Otherwise no findings on MRI. She also had a carotid duplex and 2-D echo, which were both unrevealing at that time.  She reports that she came out of the hospital at that time and continued to have weakness involving the left side but that this had improved over time with physical therapy.  She does report that she was taken off of her Plavix on June 1 in preparation for her elective cholecystectomy.  That procedure obviously was put on hold secondary to her symptoms.  Dr. Thad Ranger from Neurology saw the patient and recommended a workup for stroke and also an EEG and resumption of Plavix.  General Surgeon agreed with resuming Plavix given the risk for stroke.  [facility] admitted the patient for further evaluation and treatment.  HOSPITAL COURSE: 1. Left-sided numbness, TIA versus active ischemia:  The patient had a     2-D echo which yielded an ejection fraction of 55% to 60%.  She     also had carotid Dopplers that were negative.  MRA, MRI as stated     above.  She was seen by Neurology on two  occasions during her     hospital stay.  Impression and recommendations were no evidence of     stroke, tumor or hemorrhage.  Recommended continuing Plavix and     further investigation of a low B12 level.  During the patient's     hospitalization, her symptoms initially were intermittent and on     the day of discharge they were resolved.  The patient is to  be     discharged to her home with her son.  Their plans are to travel to     [place] in 2 days where other family members live and the patient     will follow up with a neurologist in that area.  Recommend further     investigation of the low B12 level.  The patient will be discharged     on her Plavix. 2. Malignant hypertension:  The patient was restarted on her home     medications and her blood pressure was controlled during her     hospitalization.  She will continue on her home meds as dictated     above. 3. Hypothyroidism:  TSH as stated above.  We will continue on her     current dose of levothyroxine. 4. Nausea/vomiting in the setting of known cholelithiasis.  During her     hospitalization, the patient experienced intermittent nausea but no     vomiting.  She was continued on her Zofran and Reglan.  Able to     manage these symptoms with small frequent meals and a low-fat diet.     The patient plans to follow up with a general surgeon in [place]     that her son knows.  In the meantime, recommend continuing low-fat     diet with small frequent meals and continuing her Reglan and     Zofran. 5. Depression remained at baseline during this hospitalization.     Continued her home meds. 6. Diabetes type 2, controlled.  Hemoglobin A1c 6.9. 7. Dementia remained at baseline during this hospitalization.  Will     continue on her home medications at the time of discharge.  PHYSICAL EXAMINATION:  VITAL SIGNS:  Temperature 97.8, blood pressure 148/62, heart rate 56, respiratory rate is 18, saturation is 96% on room air.  GENERAL:   Awake, alert, in no acute distress. CARDIOVASCULAR:  Regular rate and rhythm.  No murmur, gallop or rub. Trace lower extremity edema.  Pedal pulses present and palpable. RESPIRATORY:  Normal effort.  Breath sounds clear to auscultation bilaterally.  No rhonchi, wheezes or rales. ABDOMEN:  Obese, soft.  Positive bowel sounds throughout.  Abdomen nontender to palpation. MUSCULOSKELETAL:  Moves all extremities.  Continues with very mild word searching.  Some left-sided weakness, upper extremity strength 4/5, lower extremity strength 4/5.  ACTIVITY:  Up with assistance.  FOLLOWUP:  The patient will be traveling to [place] in the next 2 days to stay with family and will follow up with Neurology and General Surgery when she gets set up there.  Today she is being discharged to her home with her son who will provide care and accompany her to [place].  DISPOSITION:  The patient is medically stable and ready for discharge to home.  Time spent on discharge, 45 minutes.  Dictated For:  Conley Canal, MD     Gwenyth Bender, NP     KMB/MEDQ  D:  07/10/2010  T:  07/10/2010  Job:  [id number]  cc:   Juline Patch, M.D. Fax: [phone]  Willis Modena, MD Fax: [phone]  Abigail Miyamoto, M.D. [address] [place] [address]  Electronically Signed by Conley Canal  on 07/21/2010 05:04:19 PM

## 2010-07-28 ENCOUNTER — Encounter (INDEPENDENT_AMBULATORY_CARE_PROVIDER_SITE_OTHER): Payer: Medicare Other | Admitting: Surgery

## 2010-09-28 ENCOUNTER — Encounter (INDEPENDENT_AMBULATORY_CARE_PROVIDER_SITE_OTHER): Payer: Medicare Other | Admitting: Ophthalmology

## 2010-09-28 DIAGNOSIS — H43819 Vitreous degeneration, unspecified eye: Secondary | ICD-10-CM

## 2010-09-28 DIAGNOSIS — E11319 Type 2 diabetes mellitus with unspecified diabetic retinopathy without macular edema: Secondary | ICD-10-CM

## 2010-09-28 DIAGNOSIS — H33009 Unspecified retinal detachment with retinal break, unspecified eye: Secondary | ICD-10-CM

## 2010-10-09 LAB — DIFFERENTIAL
Basophils Absolute: 0
Basophils Relative: 1
Monocytes Absolute: 0.6
Neutro Abs: 2.4

## 2010-10-09 LAB — BASIC METABOLIC PANEL
BUN: 12
CO2: 29
Calcium: 9.5
Creatinine, Ser: 0.83
Glucose, Bld: 165 — ABNORMAL HIGH

## 2010-10-09 LAB — URINALYSIS, ROUTINE W REFLEX MICROSCOPIC
Bilirubin Urine: NEGATIVE
Ketones, ur: NEGATIVE
Nitrite: NEGATIVE
Specific Gravity, Urine: 1.016
Urobilinogen, UA: 0.2

## 2010-10-09 LAB — CBC
Hemoglobin: 14
MCHC: 34.7
Platelets: 340
RDW: 12.6

## 2010-11-10 ENCOUNTER — Other Ambulatory Visit (HOSPITAL_COMMUNITY): Payer: Self-pay | Admitting: Internal Medicine

## 2010-11-10 DIAGNOSIS — Z1231 Encounter for screening mammogram for malignant neoplasm of breast: Secondary | ICD-10-CM

## 2010-11-11 ENCOUNTER — Emergency Department (HOSPITAL_COMMUNITY)
Admission: EM | Admit: 2010-11-11 | Discharge: 2010-11-11 | Disposition: A | Discharge status: Home / Self Care | Payer: Medicare Other | Attending: Emergency Medicine | Admitting: Emergency Medicine

## 2010-11-11 ENCOUNTER — Emergency Department (HOSPITAL_COMMUNITY): Payer: Medicare Other

## 2010-11-11 DIAGNOSIS — I10 Essential (primary) hypertension: Secondary | ICD-10-CM | POA: Insufficient documentation

## 2010-11-11 DIAGNOSIS — Z8673 Personal history of transient ischemic attack (TIA), and cerebral infarction without residual deficits: Secondary | ICD-10-CM | POA: Insufficient documentation

## 2010-11-11 DIAGNOSIS — F329 Major depressive disorder, single episode, unspecified: Secondary | ICD-10-CM | POA: Insufficient documentation

## 2010-11-11 DIAGNOSIS — F29 Unspecified psychosis not due to a substance or known physiological condition: Secondary | ICD-10-CM | POA: Insufficient documentation

## 2010-11-11 DIAGNOSIS — Z79899 Other long term (current) drug therapy: Secondary | ICD-10-CM | POA: Insufficient documentation

## 2010-11-11 DIAGNOSIS — R259 Unspecified abnormal involuntary movements: Secondary | ICD-10-CM | POA: Insufficient documentation

## 2010-11-11 DIAGNOSIS — F3289 Other specified depressive episodes: Secondary | ICD-10-CM | POA: Insufficient documentation

## 2010-11-11 DIAGNOSIS — Z7902 Long term (current) use of antithrombotics/antiplatelets: Secondary | ICD-10-CM | POA: Insufficient documentation

## 2010-11-11 DIAGNOSIS — E039 Hypothyroidism, unspecified: Secondary | ICD-10-CM | POA: Insufficient documentation

## 2010-11-11 DIAGNOSIS — E119 Type 2 diabetes mellitus without complications: Secondary | ICD-10-CM | POA: Insufficient documentation

## 2010-11-11 LAB — POCT I-STAT, CHEM 8
Calcium, Ion: 1.2 mmol/L (ref 1.12–1.32)
Chloride: 94 mEq/L — ABNORMAL LOW (ref 96–112)
HCT: 45 % (ref 36.0–46.0)
Hemoglobin: 15.3 g/dL — ABNORMAL HIGH (ref 12.0–15.0)
TCO2: 27 mmol/L (ref 0–100)

## 2010-12-01 ENCOUNTER — Ambulatory Visit (HOSPITAL_COMMUNITY)
Admission: RE | Admit: 2010-12-01 | Discharge: 2010-12-01 | Disposition: A | Discharge status: Home / Self Care | Payer: Medicare Other | Source: Ambulatory Visit | Attending: Internal Medicine | Admitting: Internal Medicine

## 2010-12-01 DIAGNOSIS — Z1231 Encounter for screening mammogram for malignant neoplasm of breast: Secondary | ICD-10-CM

## 2011-07-06 ENCOUNTER — Inpatient Hospital Stay (HOSPITAL_COMMUNITY)
Admission: EM | Admit: 2011-07-06 | Discharge: 2011-07-08 | DRG: 392 | Disposition: A | Discharge status: Home / Self Care | Payer: Medicare Other | Attending: Internal Medicine | Admitting: Internal Medicine

## 2011-07-06 ENCOUNTER — Encounter (HOSPITAL_COMMUNITY): Payer: Self-pay

## 2011-07-06 ENCOUNTER — Emergency Department (HOSPITAL_COMMUNITY): Payer: Medicare Other

## 2011-07-06 DIAGNOSIS — T50995A Adverse effect of other drugs, medicaments and biological substances, initial encounter: Secondary | ICD-10-CM | POA: Diagnosis present

## 2011-07-06 DIAGNOSIS — R112 Nausea with vomiting, unspecified: Secondary | ICD-10-CM | POA: Diagnosis present

## 2011-07-06 DIAGNOSIS — K5289 Other specified noninfective gastroenteritis and colitis: Principal | ICD-10-CM | POA: Diagnosis present

## 2011-07-06 DIAGNOSIS — K529 Noninfective gastroenteritis and colitis, unspecified: Secondary | ICD-10-CM | POA: Diagnosis present

## 2011-07-06 DIAGNOSIS — E871 Hypo-osmolality and hyponatremia: Secondary | ICD-10-CM | POA: Diagnosis present

## 2011-07-06 DIAGNOSIS — R1084 Generalized abdominal pain: Secondary | ICD-10-CM

## 2011-07-06 DIAGNOSIS — R109 Unspecified abdominal pain: Secondary | ICD-10-CM

## 2011-07-06 DIAGNOSIS — Y92009 Unspecified place in unspecified non-institutional (private) residence as the place of occurrence of the external cause: Secondary | ICD-10-CM

## 2011-07-06 DIAGNOSIS — R197 Diarrhea, unspecified: Secondary | ICD-10-CM | POA: Diagnosis present

## 2011-07-06 DIAGNOSIS — E860 Dehydration: Secondary | ICD-10-CM

## 2011-07-06 LAB — DIFFERENTIAL
Eosinophils Absolute: 0.1 10*3/uL (ref 0.0–0.7)
Eosinophils Relative: 1 % (ref 0–5)
Lymphocytes Relative: 15 % (ref 12–46)
Lymphs Abs: 1.2 10*3/uL (ref 0.7–4.0)
Monocytes Relative: 8 % (ref 3–12)
Neutrophils Relative %: 77 % (ref 43–77)

## 2011-07-06 LAB — URINALYSIS, ROUTINE W REFLEX MICROSCOPIC
Bilirubin Urine: NEGATIVE
Glucose, UA: NEGATIVE mg/dL
Hgb urine dipstick: NEGATIVE
Specific Gravity, Urine: 1.012 (ref 1.005–1.030)
pH: 7.5 (ref 5.0–8.0)

## 2011-07-06 LAB — CARDIAC PANEL(CRET KIN+CKTOT+MB+TROPI)
CK, MB: 4.1 ng/mL — ABNORMAL HIGH (ref 0.3–4.0)
Relative Index: INVALID (ref 0.0–2.5)
Total CK: 50 U/L (ref 7–177)
Total CK: 53 U/L (ref 7–177)

## 2011-07-06 LAB — LIPASE, BLOOD: Lipase: 48 U/L (ref 11–59)

## 2011-07-06 LAB — CBC
Hemoglobin: 14.1 g/dL (ref 12.0–15.0)
MCH: 30.7 pg (ref 26.0–34.0)
MCV: 87.8 fL (ref 78.0–100.0)
RBC: 4.6 MIL/uL (ref 3.87–5.11)
WBC: 8.4 10*3/uL (ref 4.0–10.5)

## 2011-07-06 LAB — COMPREHENSIVE METABOLIC PANEL
ALT: 25 U/L (ref 0–35)
AST: 18 U/L (ref 0–37)
Albumin: 3.8 g/dL (ref 3.5–5.2)
CO2: 25 mEq/L (ref 19–32)
Calcium: 9 mg/dL (ref 8.4–10.5)
Sodium: 129 mEq/L — ABNORMAL LOW (ref 135–145)
Total Protein: 6.9 g/dL (ref 6.0–8.3)

## 2011-07-06 LAB — LACTIC ACID, PLASMA: Lactic Acid, Venous: 4.2 mmol/L — ABNORMAL HIGH (ref 0.5–2.2)

## 2011-07-06 MED ORDER — CARBAMAZEPINE ER 200 MG PO CP12
400.0000 mg | ORAL_CAPSULE | Freq: Every day | ORAL | Status: DC
  Administered 2011-07-06 – 2011-07-07 (×2): 400 mg via ORAL
  Filled 2011-07-06 (×3): qty 2

## 2011-07-06 MED ORDER — SODIUM CHLORIDE 0.9 % IV SOLN
Freq: Once | INTRAVENOUS | Status: AC
  Administered 2011-07-06: 11:00:00 via INTRAVENOUS

## 2011-07-06 MED ORDER — MAGNESIUM GLUCONATE 500 MG PO TABS
500.0000 mg | ORAL_TABLET | Freq: Every day | ORAL | Status: DC
  Administered 2011-07-06 – 2011-07-08 (×3): 500 mg via ORAL
  Filled 2011-07-06 (×3): qty 1

## 2011-07-06 MED ORDER — DARIFENACIN HYDROBROMIDE ER 7.5 MG PO TB24
7.5000 mg | ORAL_TABLET | Freq: Every day | ORAL | Status: DC
  Administered 2011-07-07 – 2011-07-08 (×2): 7.5 mg via ORAL
  Filled 2011-07-06 (×2): qty 1

## 2011-07-06 MED ORDER — HYDROXYZINE HCL 25 MG PO TABS: 25.0000 mg | ORAL_TABLET | ORAL | Status: DC

## 2011-07-06 MED ORDER — FENTANYL CITRATE 0.05 MG/ML IJ SOLN
50.0000 ug | Freq: Once | INTRAMUSCULAR | Status: AC
  Administered 2011-07-06: 50 ug via INTRAVENOUS
  Filled 2011-07-06: qty 2

## 2011-07-06 MED ORDER — AMLODIPINE BESYLATE 10 MG PO TABS
10.0000 mg | ORAL_TABLET | Freq: Every day | ORAL | Status: DC
  Administered 2011-07-07 – 2011-07-08 (×2): 10 mg via ORAL
  Filled 2011-07-06 (×2): qty 1

## 2011-07-06 MED ORDER — ZIPRASIDONE HCL 80 MG PO CAPS
80.0000 mg | ORAL_CAPSULE | Freq: Every day | ORAL | Status: DC
  Administered 2011-07-06 – 2011-07-07 (×2): 80 mg via ORAL
  Filled 2011-07-06 (×3): qty 1

## 2011-07-06 MED ORDER — LEVOTHYROXINE SODIUM 75 MCG PO TABS
75.0000 ug | ORAL_TABLET | Freq: Every day | ORAL | Status: DC
  Administered 2011-07-07 – 2011-07-08 (×2): 75 ug via ORAL
  Filled 2011-07-06 (×3): qty 1

## 2011-07-06 MED ORDER — MAGNESIUM 500 MG PO TABS: 1.0000 | ORAL_TABLET | Freq: Every day | ORAL | Status: DC

## 2011-07-06 MED ORDER — ENOXAPARIN SODIUM 30 MG/0.3ML ~~LOC~~ SOLN
30.0000 mg | SUBCUTANEOUS | Status: DC
  Administered 2011-07-06: 30 mg via SUBCUTANEOUS
  Filled 2011-07-06 (×2): qty 0.3

## 2011-07-06 MED ORDER — SODIUM CHLORIDE 0.9 % IV BOLUS (SEPSIS)
1000.0000 mL | Freq: Once | INTRAVENOUS | Status: AC
  Administered 2011-07-06: 1000 mL via INTRAVENOUS

## 2011-07-06 MED ORDER — CARBAMAZEPINE ER 200 MG PO CP12
200.0000 mg | ORAL_CAPSULE | Freq: Every day | ORAL | Status: DC
  Administered 2011-07-07 – 2011-07-08 (×2): 200 mg via ORAL
  Filled 2011-07-06 (×3): qty 1

## 2011-07-06 MED ORDER — HYDROXYZINE HCL 25 MG PO TABS
25.0000 mg | ORAL_TABLET | ORAL | Status: DC | PRN
Start: 2011-07-06 — End: 2011-07-08
  Administered 2011-07-07 (×2): 25 mg via ORAL
  Filled 2011-07-06 (×3): qty 1

## 2011-07-06 MED ORDER — ONDANSETRON HCL 4 MG PO TABS: 4.0000 mg | ORAL_TABLET | Freq: Four times a day (QID) | ORAL | Status: DC | PRN

## 2011-07-06 MED ORDER — ZIPRASIDONE HCL 20 MG PO CAPS: 40.0000 mg | ORAL_CAPSULE | ORAL | Status: DC

## 2011-07-06 MED ORDER — VITAMIN D 50 MCG (2000 UT) PO TABS: 2000.0000 [IU] | ORAL_TABLET | Freq: Every day | ORAL | Status: DC

## 2011-07-06 MED ORDER — ALISKIREN FUMARATE 150 MG PO TABS
150.0000 mg | ORAL_TABLET | Freq: Every day | ORAL | Status: DC
  Administered 2011-07-06 – 2011-07-08 (×3): 150 mg via ORAL
  Filled 2011-07-06 (×3): qty 1

## 2011-07-06 MED ORDER — ONDANSETRON HCL 4 MG/2ML IJ SOLN
INTRAMUSCULAR | Status: AC
  Filled 2011-07-06: qty 2

## 2011-07-06 MED ORDER — ZIPRASIDONE HCL 40 MG PO CAPS
40.0000 mg | ORAL_CAPSULE | Freq: Every day | ORAL | Status: DC
  Administered 2011-07-07 – 2011-07-08 (×2): 40 mg via ORAL
  Filled 2011-07-06 (×3): qty 1

## 2011-07-06 MED ORDER — SODIUM CHLORIDE 0.9 % IV SOLN: INTRAVENOUS | Status: DC

## 2011-07-06 MED ORDER — PANTOPRAZOLE SODIUM 40 MG PO TBEC
40.0000 mg | DELAYED_RELEASE_TABLET | Freq: Every day | ORAL | Status: DC
  Administered 2011-07-07 – 2011-07-08 (×2): 40 mg via ORAL
  Filled 2011-07-06 (×2): qty 1

## 2011-07-06 MED ORDER — IOHEXOL 300 MG/ML  SOLN
100.0000 mL | Freq: Once | INTRAMUSCULAR | Status: AC | PRN
  Administered 2011-07-06: 100 mL via INTRAVENOUS

## 2011-07-06 MED ORDER — ONDANSETRON HCL 4 MG/2ML IJ SOLN
4.0000 mg | Freq: Four times a day (QID) | INTRAMUSCULAR | Status: DC | PRN
  Administered 2011-07-06: 4 mg via INTRAVENOUS

## 2011-07-06 MED ORDER — ASPIRIN EC 81 MG PO TBEC
81.0000 mg | DELAYED_RELEASE_TABLET | Freq: Every day | ORAL | Status: DC
  Administered 2011-07-07 – 2011-07-08 (×2): 81 mg via ORAL
  Filled 2011-07-06 (×2): qty 1

## 2011-07-06 MED ORDER — ONDANSETRON HCL 4 MG/2ML IJ SOLN
4.0000 mg | Freq: Once | INTRAMUSCULAR | Status: AC
  Administered 2011-07-06: 4 mg via INTRAVENOUS
  Filled 2011-07-06: qty 2

## 2011-07-06 MED ORDER — ONDANSETRON HCL 4 MG/2ML IJ SOLN
4.0000 mg | Freq: Three times a day (TID) | INTRAMUSCULAR | Status: DC | PRN
  Filled 2011-07-06: qty 2

## 2011-07-06 MED ORDER — MORPHINE SULFATE 2 MG/ML IJ SOLN
1.0000 mg | INTRAMUSCULAR | Status: DC | PRN
  Administered 2011-07-06 (×2): 1 mg via INTRAVENOUS
  Filled 2011-07-06 (×2): qty 1

## 2011-07-06 MED ORDER — AMLODIPINE-OLMESARTAN 10-40 MG PO TABS: 1.0000 | ORAL_TABLET | Freq: Every day | ORAL | Status: DC

## 2011-07-06 MED ORDER — ALISKIREN-HYDROCHLOROTHIAZIDE 300-12.5 MG PO TABS: 0.5000 | ORAL_TABLET | Freq: Every day | ORAL | Status: DC

## 2011-07-06 MED ORDER — VITAMIN D3 25 MCG (1000 UNIT) PO TABS
2000.0000 [IU] | ORAL_TABLET | Freq: Every day | ORAL | Status: DC
  Administered 2011-07-06 – 2011-07-08 (×3): 2000 [IU] via ORAL
  Filled 2011-07-06 (×3): qty 2

## 2011-07-06 MED ORDER — SODIUM CHLORIDE 0.9 % IJ SOLN
3.0000 mL | Freq: Two times a day (BID) | INTRAMUSCULAR | Status: DC
  Administered 2011-07-07 – 2011-07-08 (×2): 3 mL via INTRAVENOUS

## 2011-07-06 MED ORDER — OLANZAPINE 5 MG PO TABS
5.0000 mg | ORAL_TABLET | Freq: Every day | ORAL | Status: DC
Start: 2011-07-06 — End: 2011-07-08
  Administered 2011-07-06 – 2011-07-07 (×2): 5 mg via ORAL
  Filled 2011-07-06 (×3): qty 1

## 2011-07-06 MED ORDER — ONDANSETRON HCL 4 MG/2ML IJ SOLN
INTRAMUSCULAR | Status: AC
  Administered 2011-07-06: 11:00:00
  Filled 2011-07-06: qty 2

## 2011-07-06 MED ORDER — NITROGLYCERIN 0.4 MG SL SUBL: 0.4000 mg | SUBLINGUAL_TABLET | SUBLINGUAL | Status: DC | PRN

## 2011-07-06 MED ORDER — SODIUM CHLORIDE 0.9 % IV BOLUS (SEPSIS): 1000.0000 mL | Freq: Once | INTRAVENOUS | Status: DC

## 2011-07-06 MED ORDER — OLMESARTAN MEDOXOMIL 40 MG PO TABS
40.0000 mg | ORAL_TABLET | Freq: Every day | ORAL | Status: DC
  Administered 2011-07-07 – 2011-07-08 (×2): 40 mg via ORAL
  Filled 2011-07-06 (×2): qty 1

## 2011-07-06 MED ORDER — CARBAMAZEPINE ER 200 MG PO CP12: 200.0000 mg | ORAL_CAPSULE | ORAL | Status: DC

## 2011-07-06 MED ORDER — SODIUM CHLORIDE 0.9 % IV SOLN
INTRAVENOUS | Status: DC
  Administered 2011-07-06 – 2011-07-07 (×3): via INTRAVENOUS

## 2011-07-06 MED ORDER — DONEPEZIL HCL 10 MG PO TABS
10.0000 mg | ORAL_TABLET | Freq: Every day | ORAL | Status: DC
  Administered 2011-07-06 – 2011-07-07 (×2): 10 mg via ORAL
  Filled 2011-07-06 (×3): qty 1

## 2011-07-06 MED ORDER — HYDROCHLOROTHIAZIDE 10 MG/ML ORAL SUSPENSION
6.2500 mg | Freq: Every day | ORAL | Status: DC
  Administered 2011-07-07 – 2011-07-08 (×2): 6.25 mg via ORAL
  Filled 2011-07-06 (×3): qty 1.25

## 2011-07-06 MED ORDER — ONDANSETRON HCL 4 MG/2ML IJ SOLN
4.0000 mg | Freq: Once | INTRAMUSCULAR | Status: AC
  Administered 2011-07-06: 4 mg via INTRAVENOUS

## 2011-07-06 NOTE — ED Notes (Signed)
[id number]<AV> Expected date:07/06/11<BR> Expected time:<BR> Means of arrival:<BR> Comments:<BR> EMS 40 GC - n/v/d

## 2011-07-06 NOTE — ED Provider Notes (Signed)
Medical screening examination/treatment/procedure(s) were conducted as a shared visit with non-physician practitioner(s) and myself.  I personally evaluated the patient during the encounter  Will admit for possible ischemic colitis given elevated lactate. abd soft without signifcant tenderness  Lyanne Co, MD 07/06/11 1528

## 2011-07-06 NOTE — ED Notes (Signed)
Oral contrast complete 

## 2011-07-06 NOTE — ED Notes (Signed)
PIV #[id number] right hand zofran $mg IVP given in route

## 2011-07-06 NOTE — H&P (Signed)
[facility] History and Physical  [name] HYQ:[id number] DOB: [birth year] DOA: 07/06/2011  Referring physician:  PCP: Juline Patch, MD   Chief Complaint: Nausea, vomiting, abdominal pain  HPI:  Patient is 71 yo female with history of 2 episodes of ischemic colitis last year that required hospitalization now presents to the emergency department with main concern of progressively worsening non bloody diarrhea, associated with nausea and non bloody vomiting and epigastric discomfort. She describes pain as dull and intermittently sharp in nature, non radiating, 7/10 in severity when present, aggravated by food and with no specific alleviating factors. She denies any fevers, chills, shortness of breath, no other abdominal or urinary concerns. Pt denies recent sicknesses or hospitalizations, and endorses that past episodes were similar but not quite intense.  Review of Systems:   Constitutional: Negative for fever, chills. Negative for diaphoresis.  HENT: Negative for hearing loss, ear pain, nosebleeds, congestion, sore throat, neck pain, tinnitus and ear discharge.   Eyes: Negative for blurred vision, double vision, photophobia, pain, discharge and redness.  Respiratory: Negative for cough, hemoptysis, sputum production, shortness of breath, wheezing and stridor.   Cardiovascular: Negative for chest pain, palpitations, orthopnea, claudication and leg swelling.  Gastrointestinal: Positive for nausea, vomiting and abdominal pain. Negative for heartburn, constipation, blood in stool and melena.  Genitourinary: Negative for dysuria, urgency, frequency, hematuria and flank pain.  Musculoskeletal: Negative for myalgias, back pain, joint pain and falls.  Skin: Negative for itching and rash.  Neurological: Negative for dizziness. Negative for tingling, tremors, sensory change, speech change, focal weakness, loss of consciousness and headaches.  Endo/Heme/Allergies: Negative for  environmental allergies and polydipsia. Does not bruise/bleed easily.  Psychiatric/Behavioral: Negative for suicidal ideas. The patient is not nervous/anxious.      Past Medical History  Diagnosis Date  . Thyroid disease   . Hypertension   . Stroke   . Depression   . Ischemic colitis, enteritis, or enterocolitis   . Sleep apnea   . Urinary bladder incontinence   . GERD (gastroesophageal reflux disease)   . Hyperlipidemia   . Diabetes mellitus     diet controlled   Past Surgical History  Procedure Date  . Abdominal hysterectomy   . Retinal detachment surgery   . Appendectomy   . Tonsillectomy   . Hemorrhoid surgery   . Fracture surgery    Social History:  reports that she has never smoked. She has never used smokeless tobacco. She reports that she does not drink alcohol. Her drug history not on file.  Allergies  Allergen Reactions  . Flagyl (Metronidazole Hcl)   . Metformin And Related Nausea And Vomiting  . Ciprofloxacin Itching and Rash    Family History  Problem Relation Age of Onset  . Heart disease Mother     Prior to Admission medications   Medication Sig Start Date End Date Taking? Authorizing Provider  Aliskiren-Hydrochlorothiazide (TEKTURNA HCT) 300-12.5 MG TABS Take 0.5 tablets by mouth daily.   Yes Historical Provider, MD  amLODipine-olmesartan (AZOR) 10-40 MG per tablet Take 1 tablet by mouth daily.     Yes Historical Provider, MD  aspirin EC 81 MG tablet Take 81 mg by mouth daily.    Yes Historical Provider, MD  carbamazepine (CARBATROL) 200 MG 12 hr capsule Take 200-400 mg by mouth See admin instructions. Takes 1 capsule in the morning and 2 capsules at bedtime   Yes Historical Provider, MD  Cholecalciferol (VITAMIN D) 2000 UNITS tablet Take 2,000 Units by mouth daily.   Yes  Historical Provider, MD  donepezil (ARICEPT) 10 MG tablet Take 10 mg by mouth at bedtime.   Yes Historical Provider, MD  esomeprazole (NEXIUM) 40 MG capsule Take 40 mg by mouth every  evening.    Yes Historical Provider, MD  hydrOXYzine (ATARAX) 25 MG tablet Take 25 mg by mouth See admin instructions. Takes 6 times per day for itching   Yes Historical Provider, MD  levothyroxine (SYNTHROID, LEVOTHROID) 75 MCG tablet Take 75 mcg by mouth daily.     Yes Historical Provider, MD  Magnesium 500 MG TABS Take 1 tablet by mouth daily.   Yes Historical Provider, MD  nitroGLYCERIN (NITROSTAT) 0.4 MG SL tablet Place 0.4 mg under the tongue every 5 (five) minutes as needed.   Yes Historical Provider, MD  OLANZapine (ZYPREXA) 5 MG tablet Take 5 mg by mouth at bedtime.   Yes Historical Provider, MD  solifenacin (VESICARE) 5 MG tablet Take 10 mg by mouth daily.   Yes Historical Provider, MD  ziprasidone (GEODON) 40 MG capsule Take 40-80 mg by mouth See admin instructions. Takes 1 capsule in the morning and 2 capsules at night   Yes Historical Provider, MD   Physical Exam: Filed Vitals:   07/06/11 1230 07/06/11 1245 07/06/11 1300 07/06/11 1506  BP: 164/85 154/112 145/78 156/85  Pulse: 78 77 77 77  Temp:    97.6 F (36.4 C)  TempSrc:    Oral  Resp:    18  SpO2: 98% 94% 97% 100%     General:  Sitting in bed, not in acute distress  Eyes: EOMI, PERRLA, no scleral icterus or conjunctival palor  Neck: Supple, no thyroid enlargement  Cardiovascular: Regular rate and rhythm, no murmurs or gallops noted  Respiratory: Clear to auscultation bilaterally, no wheezing  Abdomen: Soft, epigastric tenderness, no guarding or rebound tenderness, normal BS  Skin: Good turgor  Neurologic: Grossly nonfocal  Labs on Admission:  Basic Metabolic Panel:  Lab 07/06/11 4098  NA 129*  K 4.0  CL 91*  CO2 25  GLUCOSE 157*  BUN 10  CREATININE 0.59  CALCIUM 9.0  MG --  PHOS --   Liver Function Tests:  Lab 07/06/11 1135  AST 18  ALT 25  ALKPHOS 121*  BILITOT 0.2*  PROT 6.9  ALBUMIN 3.8    Lab 07/06/11 1135  LIPASE 48  AMYLASE --   CBC:  Lab 07/06/11 1135  WBC 8.4  NEUTROABS  6.5  HGB 14.1  HCT 40.4  MCV 87.8  PLT 280    Radiological Exams on Admission:  Ct Abdomen Pelvis W Contrast 07/06/2011    IMPRESSION:  Colonic diverticulosis without CT evidence for diverticulitis.  Hiatal hernia.  Hepatic steatosis.   Hypodensities within the head of the pancreas are unchanged from February 2012.   Consider a 6-12 month pancreas MRI / MRCP follow-up to better characterize.    Assessment/Plan  Abdominal pain with nausea and vomiting - will admit the pt for further evaluation and management - she was given antiemetic in ED and reports significant relief of symptoms - will continue supportive care for now as the exact etiology is unclear at this time - will start with NPO and will advance diet as pt tolerating - will also provide IVF and analgesia for adequate pain control - if no significant clinical improvement, may need GI consult  Hyponatremia - likely pre renal etiology - will provide IVF for now and will check BMP in AM  Hyperglycemia - will check A1C  Code Status: Full Family Communication: Pt at bedside Disposition Plan: PT evaluation  Debbora Presto, MD  [facility] Pager [phone]  If 7PM-7AM, please contact night-coverage [email or web address] Password [email or web address] 07/06/2011, 3:27 PM

## 2011-07-06 NOTE — ED Notes (Signed)
Patient transported to CT 

## 2011-07-06 NOTE — ED Provider Notes (Signed)
History     CSN: [id number]  Arrival date & time 07/06/11  1028   First MD Initiated Contact with Patient 07/06/11 1048      Chief Complaint  Patient presents with  . Nausea  . Emesis  . Diarrhea  . Abdominal Pain    (Consider location/radiation/quality/duration/timing/severity/associated sxs/prior treatment) HPI  Patient who states she had 2 episodes of ischemic colitis last year that required hospitalization presents the emergency department complaining of gradual onset diarrhea that began yesterday with 2 episodes of diarrhea this morning however acute onset nausea and vomiting that began this morning with multiple episodes of vomiting. Patient states she has been unable to eat or drink anything this morning due to nausea and vomiting and is also complaining of generalized abdominal pain. Patient was brought to ER by EMS and states the nausea and by vomiting greatly improved after given IV Zofran but has ongoing abdominal discomfort. Patient states symptoms are similar to the symptoms she had with prior episodes of ischemic colitis however states she has had no vomiting of blood or blood in her stool unlike her last episodes. She denies known fever, sick contacts, chest pain, shortness of breath, dysuria, hematuria, increased urinary frequency, blood in her stool. She denies aggravating factors. Patient has hx of numerous prior abdominal surgeries to include hysterectomy, appendectomy and cholecystectomy.   Past Medical History  Diagnosis Date  . Diabetes mellitus   . Thyroid disease   . Hypertension   . Stroke   . Depression   . Ischemic colitis, enteritis, or enterocolitis   . Sleep apnea   . Urinary bladder incontinence   . GERD (gastroesophageal reflux disease)   . Hyperlipidemia     Past Surgical History  Procedure Date  . Abdominal hysterectomy   . Retinal detachment surgery   . Appendectomy   . Tonsillectomy   . Hemorrhoid surgery   . Fracture surgery      Family History  Problem Relation Age of Onset  . Heart disease Mother     History  Substance Use Topics  . Smoking status: Never Smoker   . Smokeless tobacco: Never Used  . Alcohol Use: No    OB History    Grav Para Term Preterm Abortions TAB SAB Ect Mult Living                  Review of Systems  All other systems reviewed and are negative.    Allergies  Flagyl; Metformin and related; and Ciprofloxacin  Home Medications   Current Outpatient Rx  Name Route Sig Dispense Refill  . ALISKIREN-HYDROCHLOROTHIAZIDE 300-12.5 MG PO TABS Oral Take 0.5 tablets by mouth daily.    Marland Kitchen AMLODIPINE-OLMESARTAN 10-40 MG PO TABS Oral Take 1 tablet by mouth daily.      . ASPIRIN EC 81 MG PO TBEC Oral Take 81 mg by mouth daily.    Marland Kitchen CARBAMAZEPINE ER 200 MG PO CP12 Oral Take 200-400 mg by mouth See admin instructions. Takes 1 capsule in the morning and 2 capsules at bedtime    . VITAMIN D 2000 UNITS PO TABS Oral Take 2,000 Units by mouth daily.    . DONEPEZIL HCL 10 MG PO TABS Oral Take 10 mg by mouth at bedtime.    Marland Kitchen ESOMEPRAZOLE MAGNESIUM 40 MG PO CPDR Oral Take 40 mg by mouth every evening.     Marland Kitchen HYDROXYZINE HCL 25 MG PO TABS Oral Take 25 mg by mouth See admin instructions. Takes 6 times per day  for itching    . LEVOTHYROXINE SODIUM 75 MCG PO TABS Oral Take 75 mcg by mouth daily.      Marland Kitchen MAGNESIUM 500 MG PO TABS Oral Take 1 tablet by mouth daily.    Marland Kitchen NITROGLYCERIN 0.4 MG SL SUBL Sublingual Place 0.4 mg under the tongue every 5 (five) minutes as needed.    Marland Kitchen OLANZAPINE 5 MG PO TABS Oral Take 5 mg by mouth at bedtime.    . SOLIFENACIN SUCCINATE 5 MG PO TABS Oral Take 10 mg by mouth daily.    Marland Kitchen ZIPRASIDONE HCL 40 MG PO CAPS Oral Take 40-80 mg by mouth See admin instructions. Takes 1 capsule in the morning and 2 capsules at night      BP 159/66  Pulse 80  Temp 97.5 F (36.4 C) (Oral)  Resp 18  SpO2 96%  Physical Exam  Nursing note and vitals reviewed. Constitutional: She is  oriented to person, place, and time. She appears well-developed and well-nourished. No distress.  HENT:  Head: Normocephalic and atraumatic.  Eyes: Conjunctivae are normal.  Neck: Normal range of motion. Neck supple.  Cardiovascular: Normal rate, regular rhythm, normal heart sounds and intact distal pulses.  Exam reveals no gallop and no friction rub.   No murmur heard. Pulmonary/Chest: Effort normal and breath sounds normal. No respiratory distress. She has no wheezes. She has no rales. She exhibits no tenderness.  Abdominal: Soft. Bowel sounds are normal. She exhibits no distension and no mass. There is tenderness. There is no rebound and no guarding.       Mild TTP of LLQ and left mid abdomen without guarding or peritoneal signs.   Musculoskeletal: Normal range of motion. She exhibits no edema and no tenderness.  Neurological: She is alert and oriented to person, place, and time.  Skin: Skin is warm and dry. No rash noted. She is not diaphoretic. No erythema.  Psychiatric: She has a normal mood and affect.    ED Course  Procedures (including critical care time)  IV fluids and IV fentanyl, IV zofran.   Labs Reviewed  COMPREHENSIVE METABOLIC PANEL - Abnormal; Notable for the following:    Sodium 129 (*)     Chloride 91 (*)     Glucose, Bld 157 (*)     Alkaline Phosphatase 121 (*)     Total Bilirubin 0.2 (*)     All other components within normal limits  URINALYSIS, ROUTINE W REFLEX MICROSCOPIC - Abnormal; Notable for the following:    APPearance CLOUDY (*)     All other components within normal limits  LACTIC ACID, PLASMA - Abnormal; Notable for the following:    Lactic Acid, Venous 4.2 (*)     All other components within normal limits  CBC  DIFFERENTIAL  LIPASE, BLOOD   Ct Abdomen Pelvis W Contrast  07/06/2011  *RADIOLOGY REPORT*  Clinical Data: Left lower quadrant pain  CT ABDOMEN AND PELVIS WITH CONTRAST  Technique:  Multidetector CT imaging of the abdomen and pelvis was  performed following the standard protocol during bolus administration of intravenous contrast.  Contrast: OMNIPAQUE IOHEXOL 300 MG/ML  SOLN  Comparison: 03/17/2010, 02/27/2010  Findings: Mild bibasilar scarring versus atelectasis. Moderate hiatal hernia.  Diffuse hepatic steatosis.  No focal liver abnormality with the exception of a biliary cyst versus hamartoma within the left hepatic lobe.  Status post cholecystectomy.  No biliary ductal dilatation. Unremarkable spleen and adrenal glands.  Nonspecific hypodense foci within the head of the pancreas versus interdigitating  fat.  No main duct dilatation.  There are bilateral hypodensities within the kidneys, too small to definitively characterize, similar to prior.  No hydronephrosis or hydroureter.  No bowel obstruction.  Colonic diverticulosis without CT evidence for diverticulitis.  Appendix not confidently identified however no right lower quadrant inflammation.  No free intraperitoneal air or fluid.  No lymphadenopathy.  Circumaortic left renal vein.  Scattered aortic atherosclerosis without aneurysmal dilatation.  Thin-walled bladder.  Absent uterus.  No adnexal mass.  No acute osseous finding.  IMPRESSION: Colonic diverticulosis without CT evidence for diverticulitis.  Hiatal hernia.  Hepatic steatosis.  Hypodensities within the head of the pancreas are unchanged from February 2012.  Consider a 6-12 month pancreas MRI / MRCP follow-up to better characterize.  Original Report Authenticated By: Waneta Martins, M.D.     1. Abdominal pain   2. Nausea vomiting and diarrhea   3. Colitis       MDM  No acute findings on CT scan but worrisome elevation in lactate questioning early ischemic colitis given history.         [place], [place] 07/06/11 1444

## 2011-07-06 NOTE — ED Notes (Signed)
Per [facility]- Pt presents with no acute distress- Pt reports N/V/D x 1 day.  HX of Ischemic colitis.  Pt reports "feels the same"

## 2011-07-07 DIAGNOSIS — R112 Nausea with vomiting, unspecified: Secondary | ICD-10-CM

## 2011-07-07 DIAGNOSIS — R1084 Generalized abdominal pain: Secondary | ICD-10-CM

## 2011-07-07 DIAGNOSIS — E860 Dehydration: Secondary | ICD-10-CM

## 2011-07-07 LAB — BASIC METABOLIC PANEL
CO2: 23 mEq/L (ref 19–32)
Calcium: 8.9 mg/dL (ref 8.4–10.5)
Creatinine, Ser: 0.59 mg/dL (ref 0.50–1.10)
Glucose, Bld: 140 mg/dL — ABNORMAL HIGH (ref 70–99)

## 2011-07-07 LAB — LACTIC ACID, PLASMA: Lactic Acid, Venous: 2.5 mmol/L — ABNORMAL HIGH (ref 0.5–2.2)

## 2011-07-07 LAB — CBC
HCT: 40.1 % (ref 36.0–46.0)
Hemoglobin: 13.7 g/dL (ref 12.0–15.0)
MCH: 30.6 pg (ref 26.0–34.0)
MCV: 89.5 fL (ref 78.0–100.0)
Platelets: 282 10*3/uL (ref 150–400)
RDW: 12.3 % (ref 11.5–15.5)
WBC: 5.8 10*3/uL (ref 4.0–10.5)

## 2011-07-07 LAB — CARDIAC PANEL(CRET KIN+CKTOT+MB+TROPI)
CK, MB: 3.2 ng/mL (ref 0.3–4.0)
Relative Index: INVALID (ref 0.0–2.5)

## 2011-07-07 LAB — HEMOGLOBIN A1C
Hgb A1c MFr Bld: 6.5 % — ABNORMAL HIGH (ref <5.7)
Mean Plasma Glucose: 140 mg/dL — ABNORMAL HIGH (ref <117)

## 2011-07-07 MED ORDER — BOOST / RESOURCE BREEZE PO LIQD
1.0000 | Freq: Three times a day (TID) | ORAL | Status: DC
  Administered 2011-07-07 – 2011-07-08 (×3): 1 via ORAL

## 2011-07-07 MED ORDER — ENOXAPARIN SODIUM 40 MG/0.4ML ~~LOC~~ SOLN
40.0000 mg | SUBCUTANEOUS | Status: DC
  Administered 2011-07-07: 40 mg via SUBCUTANEOUS
  Filled 2011-07-07 (×2): qty 0.4

## 2011-07-07 NOTE — Progress Notes (Signed)
Patient c/o some pain in lower abdomen. Urine was pink tinged with small blood clots. patient was given pain medication which gave the patient relief. MD was notified and an order was given to irrigate the foley catheter.The foley was irrigated and the urine returned to yellow/clear. Patient is content at this moment.

## 2011-07-07 NOTE — Evaluation (Signed)
Physical Therapy Evaluation Patient Details Name: [name] MRN: [id number] DOB: 07-Mar-1940 Today's Date: 07/07/2011 Time: 4098-1191 PT Time Calculation (min): 21 min  PT Assessment / Plan / Recommendation Clinical Impression  Pt admitted with nausea, vomiting, diarrhea.  Pt reports she lives alone but son from [place] arrived in town last night to stay with her and she reports he can stay "however long it takes."  Pt reports she has been hospitalized mutliple times with ischemic colitis and "had "event" like a stroke but wasn't a stroke" during one hospitalization which affected her cognition.  Pt reports she went to stay with son in [facility] and finished rehab for her cognition and reports it is now much improved.  Pt reports she has been forgetful at times but sons monitor her.  Pt also admits to bumping into objects but denies falls at home.  Recommend 24/7 supervision upon d/c.    PT Assessment  Patient needs continued PT services    Follow Up Recommendations  Home health PT;Supervision/Assistance - 24 hour    Barriers to Discharge        lEquipment Recommendations  Rolling walker with 5" wheels    Recommendations for Other Services     Frequency      Precautions / Restrictions Precautions Precautions: Fall Restrictions Weight Bearing Restrictions: No   Pertinent Vitals/Pain Pt reports 3-4/10 abdominal pain, but states this is much better than before and she is able to tolerate it without intervention.      Mobility  Bed Mobility Bed Mobility: Supine to Sit;Sit to Supine Supine to Sit: 6: Modified independent (Device/Increase time) Sit to Supine: 6: Modified independent (Device/Increase time) Transfers Transfers: Stand to Sit;Sit to Stand Sit to Stand: 4: Min guard;From bed Stand to Sit: 4: Min guard;To bed Details for Transfer Assistance: verbal cues for safety Ambulation/Gait Ambulation/Gait Assistance: 4: Min assist Ambulation Distance (Feet): 40 Feet Assistive  device: Other (Comment) (pushing IV pole with both hands) Ambulation/Gait Assistance Details: pt reports slight dizziness with ambulation so returned to room, pt states she has been getting dizzy upon sitting and standing for past couple months so she usually waits a little before continuing with mobility Gait Pattern: Step-through pattern;Decreased stride length;Narrow base of support Gait velocity: decreased    Exercises     PT Diagnosis: Difficulty walking  PT Problem List: Decreased balance;Decreased mobility;Decreased activity tolerance;Decreased knowledge of use of DME;Decreased safety awareness PT Treatment Interventions: DME instruction;Gait training;Functional mobility training;Therapeutic exercise;Balance training;Therapeutic activities;Patient/family education;Neuromuscular re-education   PT Goals Acute Rehab PT Goals PT Goal Formulation: With patient Time For Goal Achievement: 07/21/11 Potential to Achieve Goals: Good Pt will go Sit to Stand: with supervision PT Goal: Sit to Stand - Progress: Goal set today Pt will go Stand to Sit: with supervision PT Goal: Stand to Sit - Progress: Goal set today Pt will Ambulate: >150 feet;with supervision;with least restrictive assistive device PT Goal: Ambulate - Progress: Goal set today Additional Goals Additional Goal #1: Pt will demonstrate improved balance by performing head turns, start/stop, and stop and turn without LOB during gait. PT Goal: Additional Goal #1 - Progress: Goal set today  Visit Information  Last PT Received On: 07/07/11 Assistance Needed: +1    Subjective Data  Subjective: "I'm feeling so much better."   Prior Functioning  Home Living Lives With: Alone Available Help at Discharge: Family Type of Home: House Home Access: Stairs to enter [employer] of Steps: 1 Entrance Stairs-Rails: None Home Layout: One level Home Adaptive Equipment:  Quad cane Prior Function Level of Independence:  Independent Communication Communication: No difficulties    Cognition  Overall Cognitive Status: Appears within functional limits for tasks assessed/performed Arousal/Alertness: Awake/alert Orientation Level: Appears intact for tasks assessed Behavior During Session: [facility] for tasks performed    Extremity/Trunk Assessment Right Upper Extremity Assessment RUE ROM/Strength/Tone: [facility] for tasks assessed Left Upper Extremity Assessment LUE ROM/Strength/Tone: WFL for tasks assessed Right Lower Extremity Assessment RLE ROM/Strength/Tone: [facility] for tasks assessed Left Lower Extremity Assessment LLE ROM/Strength/Tone: WFL for tasks assessed   Balance    End of Session PT - End of Session Activity Tolerance: Other (comment) (limited by dizziness) Patient left: in bed;with call bell/phone within reach   [facility] E 07/07/2011, 10:31 AM Pager: [phone]

## 2011-07-07 NOTE — Progress Notes (Signed)
   CARE MANAGEMENT NOTE 07/07/2011  Patient:  [name], [name]   Account Number:  [id number]  Date Initiated:  07/07/2011  Documentation initiated by:  Jiles Crocker  Subjective/Objective Assessment:   ADMITTED WITH ABDOMINAL PAIN, NAUSEA AND VOMITING     Action/Plan:   PCP: Juline Patch, MD; LIVES ALONE; AWAITING FOR PT/OT EVALS FOR DISPOSITION   Anticipated DC Date:  07/14/2011   Anticipated DC Plan:  HOME W HOME HEALTH SERVICES      DC Planning Services  CM consult             Status of service:  In process, will continue to follow Medicare Important Message given?  NA - LOS <3 / Initial given by admissions (If response is "NO", the following Medicare IM given date fields will be blank)  Per UR Regulation:  Reviewed for med. necessity/level of care/duration of stay  Comments:  07/07/2011- B Tarrence Enck RN, BSN, MHA

## 2011-07-07 NOTE — Progress Notes (Signed)
[name] is a 71 y.o. female admitted with n/v/d. Work up unrevealing. Happened before. Unclear cause. Feels better.   1. Abdominal pain   2. Nausea vomiting and diarrhea   3. Colitis     Past Medical History  Diagnosis Date  . Thyroid disease   . Hypertension   . Stroke   . Depression   . Ischemic colitis, enteritis, or enterocolitis   . Sleep apnea   . Urinary bladder incontinence   . GERD (gastroesophageal reflux disease)   . Hyperlipidemia   . Diabetes mellitus     diet controlled   Current Facility-Administered Medications  Medication Dose Route Frequency Provider Last Rate Last Dose  . aliskiren (TEKTURNA) tablet 150 mg  150 mg Oral Daily Dorothea Ogle, MD   150 mg at 07/07/11 1051   And  . hydrochlorothiazide 10 mg/mL oral suspension 6.25 mg  6.25 mg Oral Daily Dorothea Ogle, MD   6.25 mg at 07/07/11 1056  . amLODipine (NORVASC) tablet 10 mg  10 mg Oral Daily Dorothea Ogle, MD   10 mg at 07/07/11 1052   And  . olmesartan (BENICAR) tablet 40 mg  40 mg Oral Daily Dorothea Ogle, MD   40 mg at 07/07/11 1052  . aspirin EC tablet 81 mg  81 mg Oral Daily Dorothea Ogle, MD   81 mg at 07/07/11 1052  . carbamazepine (Antipsychotic - EQUETRO) 12 hr capsule 200 mg  200 mg Oral Daily Dorothea Ogle, MD   200 mg at 07/07/11 1108  . carbamazepine (Antipsychotic - EQUETRO) 12 hr capsule 400 mg  400 mg Oral QHS Dorothea Ogle, MD   400 mg at 07/06/11 2124  . cholecalciferol (VITAMIN D) tablet 2,000 Units  2,000 Units Oral Daily Dorothea Ogle, MD   2,000 Units at 07/07/11 1051  . darifenacin (ENABLEX) 24 hr tablet 7.5 mg  7.5 mg Oral Daily Dorothea Ogle, MD   7.5 mg at 07/07/11 1051  . donepezil (ARICEPT) tablet 10 mg  10 mg Oral QHS Dorothea Ogle, MD   10 mg at 07/06/11 2119  . enoxaparin (LOVENOX) injection 40 mg  40 mg Subcutaneous Q24H Ariell Gunnels, MD      . feeding supplement (RESOURCE BREEZE) liquid 1 Container  1 Container Oral TID BM Jeoffrey Massed, RD   1 Container at  07/07/11 1419  . hydrOXYzine (ATARAX/VISTARIL) tablet 25 mg  25 mg Oral Q4H PRN Dorothea Ogle, MD   25 mg at 07/07/11 0912  . levothyroxine (SYNTHROID, LEVOTHROID) tablet 75 mcg  75 mcg Oral Q breakfast Dorothea Ogle, MD   75 mcg at 07/07/11 [phone]  . magnesium gluconate (MAGONATE) tablet 500 mg  500 mg Oral Daily Dorothea Ogle, MD   500 mg at 07/07/11 1050  . morphine 2 MG/ML injection 1 mg  1 mg Intravenous Q4H PRN Dorothea Ogle, MD   1 mg at 07/06/11 2059  . nitroGLYCERIN (NITROSTAT) SL tablet 0.4 mg  0.4 mg Sublingual Q5 min PRN Dorothea Ogle, MD      . OLANZapine [facility]) tablet 5 mg  5 mg Oral QHS Dorothea Ogle, MD   5 mg at 07/06/11 2118  . ondansetron (ZOFRAN) tablet 4 mg  4 mg Oral Q6H PRN Dorothea Ogle, MD       Or  . ondansetron [facility]) injection 4 mg  4 mg Intravenous Q6H PRN Dorothea Ogle, MD  4 mg at 07/06/11 1553  . pantoprazole (PROTONIX) EC tablet 40 mg  40 mg Oral Daily Dorothea Ogle, MD   40 mg at 07/07/11 1052  . sodium chloride 0.9 % injection 3 mL  3 mL Intravenous Q12H Dorothea Ogle, MD      . ziprasidone (GEODON) capsule 40 mg  40 mg Oral Q breakfast Dorothea Ogle, MD   40 mg at 07/07/11 1610  . ziprasidone (GEODON) capsule 80 mg  80 mg Oral Q supper Dorothea Ogle, MD   80 mg at 07/07/11 1738  . DISCONTD: 0.9 %  sodium chloride infusion   Intravenous STAT Bethany Hunt, PA      . DISCONTD: 0.9 %  sodium chloride infusion   Intravenous Continuous Dorothea Ogle, MD 75 mL/hr at 07/07/11 1108    . DISCONTD: enoxaparin (LOVENOX) injection 30 mg  30 mg Subcutaneous Q24H Dorothea Ogle, MD   30 mg at 07/06/11 2118   Allergies  Allergen Reactions  . Flagyl (Metronidazole Hcl)   . Metformin And Related Nausea And Vomiting  . Ciprofloxacin Itching and Rash   Active Problems:  * No active hospital problems. *    Vital signs in last 24 hours: Temp:  [97.3 F (36.3 C)-98 F (36.7 C)] 97.4 F (36.3 C) (06/19 1400) Pulse Rate:  [71-84] 71  (06/19 1400) Resp:  [16-18] 18   (06/19 1400) BP: (122-138)/(77-83) 122/77 mmHg (06/19 0617) SpO2:  [92 %-95 %] 94 % (06/19 1400) Weight change:  Last BM Date: 07/07/11  Intake/Output from previous day: 06/18 0701 - 06/19 0700 In: 2912 [P.O.:1480; I.V.:1430; IV Piggyback:2] Out: 4175 [Urine:4175] Intake/Output this shift: Total I/O In: 1080 [P.O.:480; I.V.:600] Out: 2400 [Urine:2400]  Lab Results:  [facility] 07/07/11 0433 07/06/11 1135  WBC 5.8 8.4  HGB 13.7 14.1  HCT 40.1 40.4  PLT 282 280   BMET  Basename 07/07/11 0433 07/06/11 1135  NA 135 129*  K 4.4 4.0  CL 100 91*  CO2 23 25  GLUCOSE 140* 157*  BUN 6 10  CREATININE 0.59 0.59  CALCIUM 8.9 9.0    Studies/Results: Ct Abdomen Pelvis W Contrast  07/06/2011  *RADIOLOGY REPORT*  Clinical Data: Left lower quadrant pain  CT ABDOMEN AND PELVIS WITH CONTRAST  Technique:  Multidetector CT imaging of the abdomen and pelvis was performed following the standard protocol during bolus administration of intravenous contrast.  Contrast: OMNIPAQUE IOHEXOL 300 MG/ML  SOLN  Comparison: 03/17/2010, 02/27/2010  Findings: Mild bibasilar scarring versus atelectasis. Moderate hiatal hernia.  Diffuse hepatic steatosis.  No focal liver abnormality with the exception of a biliary cyst versus hamartoma within the left hepatic lobe.  Status post cholecystectomy.  No biliary ductal dilatation. Unremarkable spleen and adrenal glands.  Nonspecific hypodense foci within the head of the pancreas versus interdigitating fat.  No main duct dilatation.  There are bilateral hypodensities within the kidneys, too small to definitively characterize, similar to prior.  No hydronephrosis or hydroureter.  No bowel obstruction.  Colonic diverticulosis without CT evidence for diverticulitis.  Appendix not confidently identified however no right lower quadrant inflammation.  No free intraperitoneal air or fluid.  No lymphadenopathy.  Circumaortic left renal vein.  Scattered aortic atherosclerosis  without aneurysmal dilatation.  Thin-walled bladder.  Absent uterus.  No adnexal mass.  No acute osseous finding.  IMPRESSION: Colonic diverticulosis without CT evidence for diverticulitis.  Hiatal hernia.  Hepatic steatosis.  Hypodensities within the head of the pancreas are unchanged from  February 2012.  Consider a 6-12 month pancreas MRI / MRCP follow-up to better characterize.  Original Report Authenticated By: Waneta Martins, M.D.    Medications: I have reviewed the patient's current medications.   Physical exam GENERAL- alert HEAD- normal atraumatic, no neck masses, normal thyroid, no jvd RESPIRATORY- appears well, vitals normal, no respiratory distress, acyanotic, normal RR, ear and throat exam is normal, neck free of mass or lymphadenopathy, chest clear, no wheezing, crepitations, rhonchi, normal symmetric air entry CVS- regular rate and rhythm, S1, S2 normal, no murmur, click, rub or gallop ABDOMEN- abdomen is soft without significant tenderness, masses, organomegaly or guarding NEURO- Grossly normal EXTREMITIES- extremities normal, atraumatic, no cyanosis or edema  Plan  * n/v/d- ?gastroenteritis versus meds side effect. Seems resolved. Advance diet, d/c ivf, hopefully home tomorrow. Discussed plan of care with patient's son over the phone.   Vassie Kugel 07/07/2011 6:00 PM Pager: [phone].

## 2011-07-07 NOTE — Progress Notes (Signed)
Nutrition Note  Pt triggered on Nutrition Risk Report for weight loss.  Spoke with pt.  Pt on Clear Liquid diet.  Tolerating well with very good intake.  Pt with an intentional weight loss of 18# in the last month.  She had been avoiding sugar and reducing carbohydrates.  Prior to admit had been losing 1# per week and had liberalized her diet slightly.    BMI 33.4 meets criteria for obesity grade 1  Plan:   Diet advancement per MD Will add Resource Breeze tid while on the clear liquid diet.  Jeoffrey Massed [phone]

## 2011-07-08 DIAGNOSIS — K529 Noninfective gastroenteritis and colitis, unspecified: Secondary | ICD-10-CM | POA: Diagnosis present

## 2011-07-08 DIAGNOSIS — R1084 Generalized abdominal pain: Secondary | ICD-10-CM

## 2011-07-08 DIAGNOSIS — R112 Nausea with vomiting, unspecified: Secondary | ICD-10-CM

## 2011-07-08 DIAGNOSIS — E860 Dehydration: Secondary | ICD-10-CM

## 2011-07-08 LAB — COMPREHENSIVE METABOLIC PANEL
ALT: 21 U/L (ref 0–35)
AST: 13 U/L (ref 0–37)
Albumin: 3.2 g/dL — ABNORMAL LOW (ref 3.5–5.2)
Alkaline Phosphatase: 103 U/L (ref 39–117)
BUN: 8 mg/dL (ref 6–23)
Chloride: 95 mEq/L — ABNORMAL LOW (ref 96–112)
Potassium: 3.6 mEq/L (ref 3.5–5.1)
Sodium: 132 mEq/L — ABNORMAL LOW (ref 135–145)
Total Bilirubin: 0.2 mg/dL — ABNORMAL LOW (ref 0.3–1.2)

## 2011-07-08 LAB — CBC
HCT: 38.9 % (ref 36.0–46.0)
Hemoglobin: 13.2 g/dL (ref 12.0–15.0)
RDW: 12.1 % (ref 11.5–15.5)
WBC: 7.6 10*3/uL (ref 4.0–10.5)

## 2011-07-08 LAB — GLUCOSE, CAPILLARY: Glucose-Capillary: 149 mg/dL — ABNORMAL HIGH (ref 70–99)

## 2011-07-08 NOTE — Discharge Summary (Signed)
DISCHARGE SUMMARY  [name]  MR#: [id number]  DOB:Feb 06, 1940  Date of Admission: 07/06/2011 Date of Discharge: 07/08/2011  Attending Physician:Eileene Kisling  Patient's WUX:LKGM,WNUUVOZ, MD  Consults: none  Discharge Diagnoses: Present on Admission:  .Gastroenteritis  Hospital Course: [name] was admitted with n/v/abdominal pain with some diarrhea, which were all self limiting. There was concern for diverticulitis, not confirmed by ct abdomen/pelvis. Likely, she had abdominal upset form meds or food. She is back to baseline and will d/c to follow with her PCP, and have further work up if symptoms recur. She is discharged in stable condition.   Medication List  As of 07/08/2011 12:00 PM   TAKE these medications         aspirin EC 81 MG tablet   Take 81 mg by mouth daily.      AZOR 10-40 MG per tablet   Generic drug: amLODipine-olmesartan   Take 1 tablet by mouth daily.      carbamazepine 200 MG 12 hr capsule   Commonly known as: CARBATROL   Take 200-400 mg by mouth See admin instructions. Takes 1 capsule in the morning and 2 capsules at bedtime      donepezil 10 MG tablet   Commonly known as: ARICEPT   Take 10 mg by mouth at bedtime.      esomeprazole 40 MG capsule   Commonly known as: NEXIUM   Take 40 mg by mouth every evening.      hydrOXYzine 25 MG tablet   Commonly known as: ATARAX/VISTARIL   Take 25 mg by mouth See admin instructions. Takes 6 times per day for itching      levothyroxine 75 MCG tablet   Commonly known as: SYNTHROID, LEVOTHROID   Take 75 mcg by mouth daily.      Magnesium 500 MG Tabs   Take 1 tablet by mouth daily.      nitroGLYCERIN 0.4 MG SL tablet   Commonly known as: NITROSTAT   Place 0.4 mg under the tongue every 5 (five) minutes as needed.      OLANZapine 5 MG tablet   Commonly known as: ZYPREXA   Take 5 mg by mouth at bedtime.      solifenacin 5 MG tablet   Commonly known as: VESICARE   Take 10 mg by mouth daily.      TEKTURNA HCT 300-12.5 MG Tabs   Generic drug: Aliskiren-Hydrochlorothiazide   Take 0.5 tablets by mouth daily.      Vitamin D 2000 UNITS tablet   Take 2,000 Units by mouth daily.      ziprasidone 40 MG capsule   Commonly known as: GEODON   Take 40-80 mg by mouth See admin instructions. Takes 1 capsule in the morning and 2 capsules at night             Day of Discharge BP 130/79  Pulse 65  Temp 98 F (36.7 C) (Oral)  Resp 16  Ht 5\' 3"  (1.6 m)  Wt 85.276 kg (188 lb)  BMI 33.30 kg/m2  SpO2 94%  Physical Exam: Normal.  Results for orders placed during the hospital encounter of 07/06/11 (from the past 24 hour(s))  CBC     Status: Normal   Collection Time   07/08/11  4:15 AM      Component Value Range   WBC 7.6  4.0 - 10.5 K/uL   RBC 4.35  3.87 - 5.11 MIL/uL   Hemoglobin 13.2  12.0 - 15.0 g/dL   HCT 38.9  36.0 - 46.0 %   MCV 89.4  78.0 - 100.0 fL   MCH 30.3  26.0 - 34.0 pg   MCHC 33.9  30.0 - 36.0 g/dL   RDW 78.2  95.6 - 21.3 %   Platelets 263  150 - 400 K/uL  COMPREHENSIVE METABOLIC PANEL     Status: Abnormal   Collection Time   07/08/11  4:15 AM      Component Value Range   Sodium 132 (*) 135 - 145 mEq/L   Potassium 3.6  3.5 - 5.1 mEq/L   Chloride 95 (*) 96 - 112 mEq/L   CO2 26  19 - 32 mEq/L   Glucose, Bld 143 (*) 70 - 99 mg/dL   BUN 8  6 - 23 mg/dL   Creatinine, Ser 0.86  0.50 - 1.10 mg/dL   Calcium 8.9  8.4 - 57.8 mg/dL   Total Protein 6.1  6.0 - 8.3 g/dL   Albumin 3.2 (*) 3.5 - 5.2 g/dL   AST 13  0 - 37 U/L   ALT 21  0 - 35 U/L   Alkaline Phosphatase 103  39 - 117 U/L   Total Bilirubin 0.2 (*) 0.3 - 1.2 mg/dL   GFR calc non Af Amer 86 (*) >90 mL/min   GFR calc Af Amer >90  >90 mL/min  GLUCOSE, CAPILLARY     Status: Abnormal   Collection Time   07/08/11  7:47 AM      Component Value Range   Glucose-Capillary 149 (*) 70 - 99 mg/dL    Disposition: home today.   Follow-up Appts: Discharge Orders    Future Appointments: Provider: Department: Dept  Phone: Center:   09/28/2011 9:00 AM Sherrie George, MD [facility] [phone] None     Future Orders Please Complete By Expires   Diet - low sodium heart healthy      Increase activity slowly           Time spent in discharge (includes decision making & examination of pt): 15 minutes  Signed: Favian Kittleson 07/08/2011, 12:00 PM

## 2011-07-08 NOTE — Progress Notes (Signed)
07/07/11 2205 Foley catheter was removed as per patient's request. Patient stated that the catheter was a source of irritation for her. Patient voided at 0330. No further complaints noted from the patient.

## 2011-07-09 ENCOUNTER — Emergency Department (HOSPITAL_COMMUNITY)
Admission: EM | Admit: 2011-07-09 | Discharge: 2011-07-10 | Disposition: A | Discharge status: Home / Self Care | Payer: Medicare Other | Attending: Emergency Medicine | Admitting: Emergency Medicine

## 2011-07-09 ENCOUNTER — Encounter (HOSPITAL_COMMUNITY): Payer: Self-pay | Admitting: *Deleted

## 2011-07-09 DIAGNOSIS — Z79899 Other long term (current) drug therapy: Secondary | ICD-10-CM | POA: Insufficient documentation

## 2011-07-09 DIAGNOSIS — K219 Gastro-esophageal reflux disease without esophagitis: Secondary | ICD-10-CM | POA: Insufficient documentation

## 2011-07-09 DIAGNOSIS — N12 Tubulo-interstitial nephritis, not specified as acute or chronic: Secondary | ICD-10-CM | POA: Insufficient documentation

## 2011-07-09 DIAGNOSIS — E119 Type 2 diabetes mellitus without complications: Secondary | ICD-10-CM | POA: Insufficient documentation

## 2011-07-09 DIAGNOSIS — I10 Essential (primary) hypertension: Secondary | ICD-10-CM | POA: Insufficient documentation

## 2011-07-09 DIAGNOSIS — E785 Hyperlipidemia, unspecified: Secondary | ICD-10-CM | POA: Insufficient documentation

## 2011-07-09 DIAGNOSIS — Z8673 Personal history of transient ischemic attack (TIA), and cerebral infarction without residual deficits: Secondary | ICD-10-CM | POA: Insufficient documentation

## 2011-07-09 DIAGNOSIS — G4730 Sleep apnea, unspecified: Secondary | ICD-10-CM | POA: Insufficient documentation

## 2011-07-09 LAB — CBC
MCV: 87.4 fL (ref 78.0–100.0)
Platelets: 319 10*3/uL (ref 150–400)
RDW: 11.9 % (ref 11.5–15.5)
WBC: 9.4 10*3/uL (ref 4.0–10.5)

## 2011-07-09 LAB — DIFFERENTIAL
Basophils Absolute: 0 10*3/uL (ref 0.0–0.1)
Eosinophils Relative: 3 % (ref 0–5)
Lymphocytes Relative: 22 % (ref 12–46)
Neutro Abs: 6.3 10*3/uL (ref 1.7–7.7)
Neutrophils Relative %: 66 % (ref 43–77)

## 2011-07-09 LAB — BASIC METABOLIC PANEL
CO2: 25 mEq/L (ref 19–32)
Calcium: 9.5 mg/dL (ref 8.4–10.5)
GFR calc non Af Amer: 88 mL/min — ABNORMAL LOW (ref >90)
Sodium: 128 mEq/L — ABNORMAL LOW (ref 135–145)

## 2011-07-09 LAB — URINALYSIS, ROUTINE W REFLEX MICROSCOPIC
Nitrite: POSITIVE — AB
Protein, ur: 100 mg/dL — AB
Urobilinogen, UA: 0.2 mg/dL (ref 0.0–1.0)

## 2011-07-09 LAB — URINE MICROSCOPIC-ADD ON

## 2011-07-09 MED ORDER — MORPHINE SULFATE 4 MG/ML IJ SOLN
4.0000 mg | Freq: Once | INTRAMUSCULAR | Status: AC
  Administered 2011-07-09: 4 mg via INTRAVENOUS
  Filled 2011-07-09: qty 1

## 2011-07-09 MED ORDER — SODIUM CHLORIDE 0.9 % IV BOLUS (SEPSIS)
1000.0000 mL | Freq: Once | INTRAVENOUS | Status: AC
  Administered 2011-07-09: 1000 mL via INTRAVENOUS

## 2011-07-09 MED ORDER — DEXTROSE 5 % IV SOLN
1.0000 g | Freq: Once | INTRAVENOUS | Status: AC
  Administered 2011-07-09: 1 g via INTRAVENOUS
  Filled 2011-07-09: qty 10

## 2011-07-09 MED ORDER — ONDANSETRON 8 MG PO TBDP: 8.0000 mg | ORAL_TABLET | Freq: Three times a day (TID) | ORAL | Status: AC | PRN

## 2011-07-09 MED ORDER — HYDROCODONE-ACETAMINOPHEN 5-325 MG PO TABS
1.0000 | ORAL_TABLET | ORAL | Status: AC | PRN
Start: 2011-07-09 — End: 2011-07-19

## 2011-07-09 MED ORDER — ONDANSETRON 8 MG PO TBDP
8.0000 mg | ORAL_TABLET | Freq: Once | ORAL | Status: AC
  Administered 2011-07-09: 8 mg via ORAL
  Filled 2011-07-09: qty 1

## 2011-07-09 MED ORDER — CEPHALEXIN 500 MG PO CAPS: 500.0000 mg | ORAL_CAPSULE | Freq: Four times a day (QID) | ORAL | Status: AC

## 2011-07-09 MED ORDER — ONDANSETRON HCL 4 MG/2ML IJ SOLN
4.0000 mg | Freq: Once | INTRAMUSCULAR | Status: AC
  Administered 2011-07-09: 4 mg via INTRAVENOUS
  Filled 2011-07-09: qty 2

## 2011-07-09 NOTE — Discharge Instructions (Signed)
Pyelonephritis, Adult Pyelonephritis is a kidney infection. In general, there are 2 main types of pyelonephritis:  Infections that come on quickly without any warning (acute pyelonephritis).   Infections that persist for a long period of time (chronic pyelonephritis).  CAUSES  Two main causes of pyelonephritis are:  Bacteria traveling from the bladder to the kidney. This is a problem especially in pregnant women. The urine in the bladder can become filled with bacteria from multiple causes, including:   Inflammation of the prostate gland (prostatitis).   Sexual intercourse in females.   Bladder infection (cystitis).   Bacteria traveling from the bloodstream to the tissue part of the kidney.  Problems that may increase your risk of getting a kidney infection include:  Diabetes.   Kidney stones or bladder stones.   Cancer.   Catheters placed in the bladder.   Other abnormalities of the kidney or ureter.  SYMPTOMS   Abdominal pain.   Pain in the side or flank area.   Fever.   Chills.   Upset stomach.   Blood in the urine (dark urine).   Frequent urination.   Strong or persistent urge to urinate.   Burning or stinging when urinating.  DIAGNOSIS  Your caregiver may diagnose your kidney infection based on your symptoms. A urine sample may also be taken. TREATMENT  In general, treatment depends on how severe the infection is.   If the infection is mild and caught early, your caregiver may treat you with oral antibiotics and send you home.   If the infection is more severe, the bacteria may have gotten into the bloodstream. This will require intravenous (IV) antibiotics and a hospital stay. Symptoms may include:   High fever.   Severe flank pain.   Shaking chills.   Even after a hospital stay, your caregiver may require you to be on oral antibiotics for a period of time.   Other treatments may be required depending upon the cause of the infection.  HOME CARE  INSTRUCTIONS   Take your antibiotics as directed. Finish them even if you start to feel better.   Make an appointment to have your urine checked to make sure the infection is gone.   Drink enough fluids to keep your urine clear or pale yellow.   Take medicines for the bladder if you have urgency and frequency of urination as directed by your caregiver.  SEEK IMMEDIATE MEDICAL CARE IF:   You have a fever.   You are unable to take your antibiotics or fluids.   You develop shaking chills.   You experience extreme weakness or fainting.   There is no improvement after 2 days of treatment.  MAKE SURE YOU:  Understand these instructions.   Will watch your condition.   Will get help right away if you are not doing well or get worse.  Document Released: 01/04/2005 Document Revised: 12/24/2010 Document Reviewed: 06/10/2010 [employer] Patient Information 2012 [employer]. 

## 2011-07-09 NOTE — ED Notes (Signed)
Pt states she was just released from the hospital for diverticulosis. States now having blood in urine and lower back pain with n/v. States she had a foley catheter placed while in hospital for urinary frequency.

## 2011-07-09 NOTE — ED Notes (Signed)
Pt has urinated twice in toilet since being placed in room and has passed sediment and blood clots in her urine.

## 2011-07-09 NOTE — ED Provider Notes (Signed)
History     CSN: [id number]  Arrival date & time 07/09/11  1818   First MD Initiated Contact with Patient 07/09/11 1858      Chief Complaint  Patient presents with  . Hematuria  . Nausea  . Emesis     The history is provided by the patient.   Patient presents with one day of worsening dysuria; urinary frequency, urgency, and incontinence; hematuria; nausea and vomiting; and flank pain.  She denies fever, chills, night sweats, chest pain, shortness of breath. She was discharged from this hospital yesterday after being admitted for nausea, vomiting, and diarrhea.  Her work up was inconclusive and she was discharged home tolerating PO diet. She had some nausea and vomiting again this morning which resolved with phenergan.  She did pass some blood through her foley while she was admitted, but this resolved on its own.    Past Medical History  Diagnosis Date  . Thyroid disease   . Hypertension   . Depression   . Ischemic colitis, enteritis, or enterocolitis   . Sleep apnea   . Urinary bladder incontinence   . GERD (gastroesophageal reflux disease)   . Hyperlipidemia   . Diabetes mellitus     diet controlled  . Stroke     x's 2    Past Surgical History  Procedure Date  . Retinal detachment surgery   . Appendectomy   . Tonsillectomy   . Hemorrhoid surgery   . Fracture surgery   . Cholecystectomy   . Abdominal hysterectomy     partial     Family History  Problem Relation Age of Onset  . Heart disease Mother     History  Substance Use Topics  . Smoking status: Never Smoker   . Smokeless tobacco: Never Used  . Alcohol Use: No    OB History    Grav Para Term Preterm Abortions TAB SAB Ect Mult Living                  Review of Systems  All other systems reviewed and are negative.    Allergies  Flagyl; Metformin and related; and Ciprofloxacin  Home Medications   Current Outpatient Rx  Name Route Sig Dispense Refill  . ALISKIREN-HYDROCHLOROTHIAZIDE  300-12.5 MG PO TABS Oral Take 0.5 tablets by mouth daily.    Marland Kitchen AMLODIPINE-OLMESARTAN 10-40 MG PO TABS Oral Take 1 tablet by mouth daily.      . ASPIRIN EC 81 MG PO TBEC Oral Take 162 mg by mouth daily.     Marland Kitchen CARBAMAZEPINE ER 200 MG PO CP12 Oral Take 200-400 mg by mouth See admin instructions. Takes 1 capsule in the morning and 2 capsules at bedtime    . VITAMIN D 2000 UNITS PO TABS Oral Take 2,000 Units by mouth daily.    . DONEPEZIL HCL 10 MG PO TABS Oral Take 10 mg by mouth at bedtime.    Marland Kitchen ESOMEPRAZOLE MAGNESIUM 40 MG PO CPDR Oral Take 40 mg by mouth daily as needed. Acid reflux    . HYDROXYZINE HCL 25 MG PO TABS Oral Take 25 mg by mouth See admin instructions. Takes 6 times per day for itching    . LEVOTHYROXINE SODIUM 75 MCG PO TABS Oral Take 75 mcg by mouth daily.      Marland Kitchen MAGNESIUM 500 MG PO TABS Oral Take 1 tablet by mouth daily.    Marland Kitchen NITROGLYCERIN 0.4 MG SL SUBL Sublingual Place 0.4 mg under the tongue every 5 (five)  minutes as needed.    Marland Kitchen OLANZAPINE 5 MG PO TABS Oral Take 5 mg by mouth at bedtime.    . SOLIFENACIN SUCCINATE 5 MG PO TABS Oral Take 10 mg by mouth daily.    Marland Kitchen ZIPRASIDONE HCL 40 MG PO CAPS Oral Take 40-80 mg by mouth See admin instructions. Takes 1 capsule in the morning and 2 capsules at night      BP 147/67  Pulse 88  Temp 97.6 F (36.4 C) (Oral)  Resp 18  SpO2 97%  Physical Exam  Nursing note and vitals reviewed. Constitutional: She is oriented to person, place, and time. She appears well-developed and well-nourished. No distress.  HENT:  Head: Normocephalic and atraumatic.  Eyes: EOM are normal.  Neck: Normal range of motion.  Cardiovascular: Normal rate, regular rhythm and normal heart sounds.   Pulmonary/Chest: Effort normal and breath sounds normal.  Abdominal: Soft. Bowel sounds are normal. She exhibits no distension. There is tenderness in the periumbilical area and suprapubic area. There is no rebound.  Musculoskeletal: Normal range of motion. She  exhibits edema.  Neurological: She is alert and oriented to person, place, and time.  Skin: Skin is warm and dry.  Psychiatric: She has a normal mood and affect. Judgment normal.    ED Course  Procedures (including critical care time)  Labs Reviewed  URINALYSIS, ROUTINE W REFLEX MICROSCOPIC - Abnormal; Notable for the following:    Color, Urine RED (*)  BIOCHEMICALS MAY BE AFFECTED BY COLOR   APPearance CLOUDY (*)     Hgb urine dipstick LARGE (*)     Ketones, ur TRACE (*)     Protein, ur 100 (*)     Nitrite POSITIVE (*)     Leukocytes, UA LARGE (*)     All other components within normal limits  BASIC METABOLIC PANEL - Abnormal; Notable for the following:    Sodium 128 (*)     Chloride 91 (*)     Glucose, Bld 183 (*)     GFR calc non Af Amer 88 (*)     All other components within normal limits  URINE MICROSCOPIC-ADD ON - Abnormal; Notable for the following:    Bacteria, UA FEW (*)     All other components within normal limits  CBC  DIFFERENTIAL  URINE CULTURE   No results found.   1. Pyelonephritis       MDM  The patient symptoms are suggestive of pyelonephritis.  This likely was secondary to Foley catheter placement on the hospital.  She feels much better at this time.  Home with a prescription for pain medicine nausea medicine and antibiotics.  The patient understands return in the ED for any new or worsening symptoms to the weekend.  She will otherwise followup with her doctor on Monday for followup.urine culture sent        Lyanne Co, MD 07/09/11 2313

## 2011-07-11 LAB — URINE CULTURE

## 2011-07-12 NOTE — ED Notes (Signed)
+   Urine Patient treated with Keflex-sensitive to same-chart appended per protocol MD. 

## 2011-09-28 ENCOUNTER — Encounter (INDEPENDENT_AMBULATORY_CARE_PROVIDER_SITE_OTHER): Payer: Medicare Other | Admitting: Ophthalmology

## 2011-10-06 ENCOUNTER — Encounter (INDEPENDENT_AMBULATORY_CARE_PROVIDER_SITE_OTHER): Payer: Medicare Other | Admitting: Ophthalmology

## 2011-10-06 DIAGNOSIS — I10 Essential (primary) hypertension: Secondary | ICD-10-CM

## 2011-10-06 DIAGNOSIS — E1139 Type 2 diabetes mellitus with other diabetic ophthalmic complication: Secondary | ICD-10-CM

## 2011-10-06 DIAGNOSIS — H43819 Vitreous degeneration, unspecified eye: Secondary | ICD-10-CM

## 2011-10-06 DIAGNOSIS — E11319 Type 2 diabetes mellitus with unspecified diabetic retinopathy without macular edema: Secondary | ICD-10-CM

## 2011-10-06 DIAGNOSIS — H35039 Hypertensive retinopathy, unspecified eye: Secondary | ICD-10-CM

## 2011-10-06 DIAGNOSIS — H33009 Unspecified retinal detachment with retinal break, unspecified eye: Secondary | ICD-10-CM

## 2011-10-06 DIAGNOSIS — E1165 Type 2 diabetes mellitus with hyperglycemia: Secondary | ICD-10-CM

## 2011-10-26 ENCOUNTER — Other Ambulatory Visit (HOSPITAL_COMMUNITY): Payer: Self-pay | Admitting: Internal Medicine

## 2011-10-26 DIAGNOSIS — Z1231 Encounter for screening mammogram for malignant neoplasm of breast: Secondary | ICD-10-CM

## 2011-12-08 ENCOUNTER — Ambulatory Visit (HOSPITAL_COMMUNITY)
Admission: RE | Admit: 2011-12-08 | Discharge: 2011-12-08 | Disposition: A | Discharge status: Home / Self Care | Payer: Medicare Other | Source: Ambulatory Visit | Attending: Internal Medicine | Admitting: Internal Medicine

## 2011-12-08 DIAGNOSIS — Z1231 Encounter for screening mammogram for malignant neoplasm of breast: Secondary | ICD-10-CM

## 2011-12-10 DIAGNOSIS — L299 Pruritus, unspecified: Secondary | ICD-10-CM | POA: Insufficient documentation

## 2011-12-10 DIAGNOSIS — L209 Atopic dermatitis, unspecified: Secondary | ICD-10-CM | POA: Insufficient documentation

## 2012-05-08 ENCOUNTER — Emergency Department (HOSPITAL_COMMUNITY)
Admission: EM | Admit: 2012-05-08 | Discharge: 2012-05-08 | Disposition: A | Discharge status: Home / Self Care | Payer: Medicare Other | Attending: Emergency Medicine | Admitting: Emergency Medicine

## 2012-05-08 ENCOUNTER — Emergency Department (HOSPITAL_COMMUNITY): Payer: Medicare Other

## 2012-05-08 ENCOUNTER — Encounter (HOSPITAL_COMMUNITY): Payer: Self-pay | Admitting: Emergency Medicine

## 2012-05-08 DIAGNOSIS — M545 Low back pain, unspecified: Secondary | ICD-10-CM | POA: Insufficient documentation

## 2012-05-08 DIAGNOSIS — Z8673 Personal history of transient ischemic attack (TIA), and cerebral infarction without residual deficits: Secondary | ICD-10-CM | POA: Insufficient documentation

## 2012-05-08 DIAGNOSIS — E079 Disorder of thyroid, unspecified: Secondary | ICD-10-CM | POA: Insufficient documentation

## 2012-05-08 DIAGNOSIS — Z8639 Personal history of other endocrine, nutritional and metabolic disease: Secondary | ICD-10-CM | POA: Insufficient documentation

## 2012-05-08 DIAGNOSIS — F3289 Other specified depressive episodes: Secondary | ICD-10-CM | POA: Insufficient documentation

## 2012-05-08 DIAGNOSIS — R42 Dizziness and giddiness: Secondary | ICD-10-CM

## 2012-05-08 DIAGNOSIS — E119 Type 2 diabetes mellitus without complications: Secondary | ICD-10-CM | POA: Insufficient documentation

## 2012-05-08 DIAGNOSIS — Z8719 Personal history of other diseases of the digestive system: Secondary | ICD-10-CM | POA: Insufficient documentation

## 2012-05-08 DIAGNOSIS — R109 Unspecified abdominal pain: Secondary | ICD-10-CM | POA: Insufficient documentation

## 2012-05-08 DIAGNOSIS — R112 Nausea with vomiting, unspecified: Secondary | ICD-10-CM | POA: Insufficient documentation

## 2012-05-08 DIAGNOSIS — Z862 Personal history of diseases of the blood and blood-forming organs and certain disorders involving the immune mechanism: Secondary | ICD-10-CM | POA: Insufficient documentation

## 2012-05-08 DIAGNOSIS — K219 Gastro-esophageal reflux disease without esophagitis: Secondary | ICD-10-CM | POA: Insufficient documentation

## 2012-05-08 DIAGNOSIS — Z79899 Other long term (current) drug therapy: Secondary | ICD-10-CM | POA: Insufficient documentation

## 2012-05-08 DIAGNOSIS — F329 Major depressive disorder, single episode, unspecified: Secondary | ICD-10-CM | POA: Insufficient documentation

## 2012-05-08 DIAGNOSIS — I10 Essential (primary) hypertension: Secondary | ICD-10-CM | POA: Insufficient documentation

## 2012-05-08 LAB — COMPREHENSIVE METABOLIC PANEL
ALT: 27 U/L (ref 0–35)
Albumin: 3.9 g/dL (ref 3.5–5.2)
Alkaline Phosphatase: 97 U/L (ref 39–117)
Potassium: 4.7 mEq/L (ref 3.5–5.1)
Sodium: 131 mEq/L — ABNORMAL LOW (ref 135–145)
Total Protein: 7.3 g/dL (ref 6.0–8.3)

## 2012-05-08 LAB — URINALYSIS, ROUTINE W REFLEX MICROSCOPIC
Bilirubin Urine: NEGATIVE
Glucose, UA: 100 mg/dL — AB
Ketones, ur: NEGATIVE mg/dL
Leukocytes, UA: NEGATIVE
Specific Gravity, Urine: 1.025 (ref 1.005–1.030)
pH: 6.5 (ref 5.0–8.0)

## 2012-05-08 LAB — PROTIME-INR
INR: 0.96 (ref 0.00–1.49)
Prothrombin Time: 12.7 seconds (ref 11.6–15.2)

## 2012-05-08 LAB — CBC
MCH: 30.4 pg (ref 26.0–34.0)
MCHC: 35.5 g/dL (ref 30.0–36.0)
Platelets: 325 10*3/uL (ref 150–400)
RDW: 12.5 % (ref 11.5–15.5)

## 2012-05-08 LAB — DIFFERENTIAL
Basophils Relative: 0 % (ref 0–1)
Eosinophils Absolute: 0 10*3/uL (ref 0.0–0.7)
Neutrophils Relative %: 81 % — ABNORMAL HIGH (ref 43–77)

## 2012-05-08 MED ORDER — MECLIZINE HCL 25 MG PO TABS
25.0000 mg | ORAL_TABLET | Freq: Once | ORAL | Status: AC
  Administered 2012-05-08: 25 mg via ORAL
  Filled 2012-05-08: qty 1

## 2012-05-08 MED ORDER — ONDANSETRON HCL 8 MG PO TABS: 8.0000 mg | ORAL_TABLET | Freq: Three times a day (TID) | ORAL | Status: DC | PRN

## 2012-05-08 MED ORDER — MECLIZINE HCL 25 MG PO TABS: 25.0000 mg | ORAL_TABLET | Freq: Three times a day (TID) | ORAL | Status: DC | PRN

## 2012-05-08 MED ORDER — ONDANSETRON HCL 4 MG/2ML IJ SOLN
4.0000 mg | Freq: Once | INTRAMUSCULAR | Status: AC | PRN
  Administered 2012-05-08: 4 mg via INTRAVENOUS
  Filled 2012-05-08: qty 2

## 2012-05-08 NOTE — ED Notes (Signed)
Patient transported to MRI 

## 2012-05-08 NOTE — ED Provider Notes (Signed)
History    CSN: [id number] Arrival date & time 05/08/12  1220 First MD Initiated Contact with Patient 05/08/12 1320      Chief Complaint  Patient presents with  . Nausea  . Dizziness    HPI The patient presents to the emergency room with complaints of nausea and dizziness. Patient was at her doctor's office today for routine followup appointment regarding her diabetes. While in the office she suddenly became dizzy and nauseated. Patient cannot particularly feel that the room was spinning but she did not feel well. She denied any focal trouble with her speech or weakness in her extremities however she did feel like her coordination and balance were off. The symptoms persisted. She started have a few episodes of nausea and vomiting.  During that episode she did have some pain in her lower back and flank. Her doctor gave her some Phenergan and her symptoms have improved but not completely resolved. She still feels dizzy. It worsens when she tries to move or set up. It is better when she is lying still. She denies any trouble with fevers or chest pain. She does not have any abdominal pain at this time. Her doctor was concerned about the various possibilities and sent her to the emergency room for further evaluation Past Medical History  Diagnosis Date  . Thyroid disease   . Hypertension   . Depression   . Ischemic colitis, enteritis, or enterocolitis   . Sleep apnea   . Urinary bladder incontinence   . GERD (gastroesophageal reflux disease)   . Hyperlipidemia   . Diabetes mellitus     diet controlled  . Stroke     x's 2    Past Surgical History  Procedure Laterality Date  . Retinal detachment surgery    . Appendectomy    . Tonsillectomy    . Hemorrhoid surgery    . Fracture surgery    . Cholecystectomy    . Abdominal hysterectomy      partial     Family History  Problem Relation Age of Onset  . Heart disease Mother     History  Substance Use Topics  . Smoking status: Never  Smoker   . Smokeless tobacco: Never Used  . Alcohol Use: No    OB History   Grav Para Term Preterm Abortions TAB SAB Ect Mult Living                  Review of Systems  All other systems reviewed and are negative.    Allergies  Flagyl; Metformin and related; and Ciprofloxacin  Home Medications   Current Outpatient Rx  Name  Route  Sig  Dispense  Refill  . Alisk[place]ren-Hydrochlorothiazide (TEKTURNA HCT) 300-12.5 MG TABS   Oral   Take 0.5 tablets by mouth every evening.          Marland Kitchen amLODipine-olmesartan (AZOR) 10-40 MG per tablet   Oral   Take 1 tablet by mouth daily.           Marland Kitchen aspirin EC 81 MG tablet   Oral   Take 162 mg by mouth daily.          . carbamazepine (CARBATROL) 200 MG 12 hr capsule   Oral   Take 200-400 mg by mouth 2 (two) times daily. Takes 1 capsule in the morning and 2 capsules at bedtime         . Cholecalciferol (VITAMIN D) 2000 UNITS tablet   Oral   Take 2,000 Units  by mouth daily.         Marland Kitchen donepezil (ARICEPT) 10 MG tablet   Oral   Take 10 mg by mouth at bedtime.         Marland Kitchen esomeprazole (NEXIUM) 40 MG capsule   Oral   Take 40 mg by mouth daily before breakfast. Acid reflux         . fesoterodine (TOVIAZ) 8 MG TB24   Oral   Take 8 mg by mouth daily.         . folic acid (FOLVITE) 1 MG tablet   Oral   Take 1 mg by mouth daily.         . hydrOXYzine (ATARAX) 25 MG tablet   Oral   Take 25-50 mg by mouth every 6 (six) hours as needed for itching.          . levocetirizine (XYZAL) 5 MG tablet   Oral   Take 5 mg by mouth every evening.         Marland Kitchen levothyroxine (SYNTHROID, LEVOTHROID) 75 MCG tablet   Oral   Take 75 mcg by mouth daily.           . methotrexate (RHEUMATREX) 2.5 MG tablet   Oral   Take 7.5 mg by mouth once a week. Pt takes 2 tablets for 7.5mg  dose. Pt takes on Thursday of each week. Caution:Chemotherapy. Protect from light.         . nitroGLYCERIN (NITROSTAT) 0.4 MG SL tablet   Sublingual    Place 0.4 mg under the tongue every 5 (five) minutes as needed.         Marland Kitchen OLANZapine (ZYPREXA) 5 MG tablet   Oral   Take 5 mg by mouth at bedtime.         . saxagliptin HCl (ONGLYZA) 5 MG TABS tablet   Oral   Take 5 mg by mouth daily.         . ziprasidone (GEODON) 40 MG capsule   Oral   Take 40 mg by mouth every morning.         . ziprasidone (GEODON) 80 MG capsule   Oral   Take 80 mg by mouth at bedtime.         . meclizine (ANTIVERT) 25 MG tablet   Oral   Take 1 tablet (25 mg total) by mouth 3 (three) times daily as needed.   30 tablet   0   . ondansetron (ZOFRAN) 8 MG tablet   Oral   Take 1 tablet (8 mg total) by mouth every 8 (eight) hours as needed for nausea.   9 tablet   0     BP 154/79  Pulse 84  Temp(Src) 97.5 F (36.4 C) (Oral)  Resp 16  SpO2 94%  Physical Exam  Nursing note and vitals reviewed. Constitutional: She is oriented to person, place, and time. She appears well-developed and well-nourished. No distress.  HENT:  Head: Normocephalic and atraumatic.  Right Ear: External ear normal.  Left Ear: External ear normal.  Mouth/Throat: Oropharynx is clear and moist.  Eyes: Conjunctivae are normal. Right eye exhibits no discharge. Left eye exhibits no discharge. No scleral icterus.  Lateral nystagmus  Neck: Neck supple. No tracheal deviation present.  Cardiovascular: Normal rate, regular rhythm and intact distal pulses.   Pulmonary/Chest: Effort normal and breath sounds normal. No stridor. No respiratory distress. She has no wheezes. She has no rales.  Abdominal: Soft. Bowel sounds are normal. She exhibits no distension.  There is no tenderness. There is no rebound and no guarding.  Musculoskeletal: She exhibits no edema and no tenderness.  Neurological: She is alert and oriented to person, place, and time. She has normal strength. No cranial nerve deficit ( no gross defecits noted) or sensory deficit. She exhibits normal muscle tone. She  displays no seizure activity. Coordination normal.  No pronator drift bilateral upper extrem, able to hold both legs off bed for 5 seconds, sensation intact in all extremities, no visual field cuts, no left or right sided neglect; difficulty sitting up in bed without becoming lightheaded and dizzy, unable to walk due to her symptoms  Skin: Skin is warm and dry. No rash noted.  Psychiatric: She has a normal mood and affect.    ED Course  Procedures (including critical care time) EKG Normal sinus rhythm 76 Left axis deviation Borderline T-wave abnormalities laterally No significant change when compared to prior EKG dated 20- June 2012  Labs Reviewed  CBC - Abnormal; Notable for the following:    Hemoglobin 15.1 (*)    All other components within normal limits  DIFFERENTIAL - Abnormal; Notable for the following:    Neutrophils Relative 81 (*)    All other components within normal limits  COMPREHENSIVE METABOLIC PANEL - Abnormal; Notable for the following:    Sodium 131 (*)    Chloride 92 (*)    Glucose, Bld 194 (*)    Total Bilirubin 0.2 (*)    GFR calc non Af Amer 88 (*)    All other components within normal limits  URINALYSIS, ROUTINE W REFLEX MICROSCOPIC - Abnormal; Notable for the following:    Glucose, UA 100 (*)    All other components within normal limits  PROTIME-INR  APTT   Mr Brain Wo Contrast  05/08/2012  *RADIOLOGY REPORT*  Clinical Data: Nausea and dizziness.  Weakness and headache. Hypertension.  MRI HEAD WITHOUT CONTRAST  Technique:  Multiplanar, multiecho pulse sequences of the brain and surrounding structures were obtained according to standard protocol without intravenous contrast.  Comparison: MRI brain 11/10/2009.  Findings: The diffusion weighted images demonstrate no evidence for acute or subacute infarction.  Scattered subcortical T2 hyperintensities are stable.  No acute hemorrhage or mass lesion is present.  The ventricles are of normal size.  No significant  extra- axial fluid collection is present.  Flow is present in the major intracranial arteries.  The patient is status post bilateral lens extractions. The patient is status post scleral banding on the right.  The paranasal sinuses are clear. There is some fluid in the mastoid air cells bilaterally.  IMPRESSION:  1.  No acute intracranial abnormality or significant interval change. 2.  Stable atrophy and mild white matter disease, potentially within normal limits for age. 3.  Postoperative changes of the orbits bilaterally.   Original Report Authenticated By: Marin Roberts, M.D.      1. Vertigo       MDM  Patient does not have any evidence of stroke on the CT scan. I doubt that her symptoms are related to a TIA.  I suspect the symptoms are related to peripheral vertigo consuming the rapid onset in association with head movement. Patient improved with treatment in emergency department. To be discharged home with a prescription for meclizine and Zofran.        Celene Kras, MD 05/08/12 1534

## 2012-05-08 NOTE — ED Notes (Signed)
PER PTAR-picked up  PCP, while sitting in waiting room pt had a sudden onset of nausea and vomiting.  Dr felt like pt should be sent here to be evaluated.  Pt c/o flank pain and lightheadednessx1 day.  Pt not actively vomiting. Alert and oriented.  Pt reports that she was given phenergan at PCP

## 2012-09-18 ENCOUNTER — Encounter: Payer: Self-pay | Admitting: Pulmonary Disease

## 2012-09-19 ENCOUNTER — Encounter: Payer: Self-pay | Admitting: Pulmonary Disease

## 2012-09-29 ENCOUNTER — Other Ambulatory Visit: Payer: Self-pay | Admitting: Gastroenterology

## 2012-10-05 ENCOUNTER — Ambulatory Visit (INDEPENDENT_AMBULATORY_CARE_PROVIDER_SITE_OTHER): Payer: Medicare Other | Admitting: Ophthalmology

## 2012-10-05 DIAGNOSIS — H35039 Hypertensive retinopathy, unspecified eye: Secondary | ICD-10-CM

## 2012-10-05 DIAGNOSIS — H43819 Vitreous degeneration, unspecified eye: Secondary | ICD-10-CM

## 2012-10-05 DIAGNOSIS — E11319 Type 2 diabetes mellitus with unspecified diabetic retinopathy without macular edema: Secondary | ICD-10-CM

## 2012-10-05 DIAGNOSIS — E1139 Type 2 diabetes mellitus with other diabetic ophthalmic complication: Secondary | ICD-10-CM

## 2012-10-05 DIAGNOSIS — H33009 Unspecified retinal detachment with retinal break, unspecified eye: Secondary | ICD-10-CM

## 2012-10-05 DIAGNOSIS — I10 Essential (primary) hypertension: Secondary | ICD-10-CM

## 2013-01-22 ENCOUNTER — Other Ambulatory Visit (HOSPITAL_COMMUNITY): Payer: Self-pay | Admitting: Internal Medicine

## 2013-01-22 DIAGNOSIS — Z1231 Encounter for screening mammogram for malignant neoplasm of breast: Secondary | ICD-10-CM

## 2013-02-05 ENCOUNTER — Ambulatory Visit (HOSPITAL_COMMUNITY)
Admission: RE | Admit: 2013-02-05 | Discharge: 2013-02-05 | Disposition: A | Discharge status: Home / Self Care | Payer: Medicare Other | Source: Ambulatory Visit | Attending: Internal Medicine | Admitting: Internal Medicine

## 2013-02-05 DIAGNOSIS — Z1231 Encounter for screening mammogram for malignant neoplasm of breast: Secondary | ICD-10-CM

## 2013-06-14 ENCOUNTER — Other Ambulatory Visit: Payer: Self-pay | Admitting: *Deleted

## 2013-06-14 ENCOUNTER — Ambulatory Visit (INDEPENDENT_AMBULATORY_CARE_PROVIDER_SITE_OTHER): Payer: Medicare Other | Admitting: Endocrinology

## 2013-06-14 ENCOUNTER — Encounter: Payer: Self-pay | Admitting: Endocrinology

## 2013-06-14 VITALS — BP 144/84 | HR 85 | Temp 98.4°F | Resp 16 | Ht 63.0 in | Wt 216.8 lb

## 2013-06-14 DIAGNOSIS — IMO0001 Reserved for inherently not codable concepts without codable children: Secondary | ICD-10-CM

## 2013-06-14 DIAGNOSIS — E039 Hypothyroidism, unspecified: Secondary | ICD-10-CM

## 2013-06-14 DIAGNOSIS — E782 Mixed hyperlipidemia: Secondary | ICD-10-CM | POA: Insufficient documentation

## 2013-06-14 DIAGNOSIS — I129 Hypertensive chronic kidney disease with stage 1 through stage 4 chronic kidney disease, or unspecified chronic kidney disease: Secondary | ICD-10-CM | POA: Insufficient documentation

## 2013-06-14 DIAGNOSIS — I10 Essential (primary) hypertension: Secondary | ICD-10-CM

## 2013-06-14 DIAGNOSIS — E1165 Type 2 diabetes mellitus with hyperglycemia: Principal | ICD-10-CM

## 2013-06-14 DIAGNOSIS — E119 Type 2 diabetes mellitus without complications: Secondary | ICD-10-CM | POA: Insufficient documentation

## 2013-06-14 DIAGNOSIS — E785 Hyperlipidemia, unspecified: Secondary | ICD-10-CM | POA: Insufficient documentation

## 2013-06-14 MED ORDER — LIRAGLUTIDE 18 MG/3ML ~~LOC~~ SOPN: PEN_INJECTOR | SUBCUTANEOUS | Status: DC

## 2013-06-14 MED ORDER — INSULIN ASPART 100 UNIT/ML FLEXPEN: PEN_INJECTOR | SUBCUTANEOUS | Status: DC

## 2013-06-14 NOTE — Progress Notes (Signed)
Patient ID: [name], female   DOB: 22-Nov-1940, 73 y.o.   MRN: [id number]    Reason for Appointment: Consultation for Type 2 Diabetes  History of Present Illness:          Diagnosis: Type 2 diabetes mellitus, date of diagnosis:2011       Past history: Since she was apparently intolerant to metformin she was treated with Onglyza until about 2014  At that time because of increasing A1c of about 8% she was started on Levemir 15 units and this was aggressively increased Onglyza was continued She has not tried any other treatments for diabetes  Recent history:  Her A1c has been 10-10.5 this year On her last visit she with her PCP in 3/15 she was told to start small doses of NovoLog at lunch and supper and add 15 units of Levemir at night also However with this her blood sugars are only slightly better She complains of feeling tired but not excessively thirsty. No recent weight change. Currently her NovoLog regimen is only based on blood sugars and no more than 8 units. However she does not appear to be checking her blood sugars consistently at lunch and supper according to her blood sugar diary Does not check readings after supper except last night when it was 400 after traveling all day       Oral hypoglycemic drugs the patient is taking are: None   Side effects from medications have been: Metformin: rash INSULIN regimen is described as: Novolog acl, acs 4-8; Levemir 52 in am; 15 hs   Glucose monitoring:  done 1-3 time a day         Glucometer: ? One Touch.      Blood Glucose readings from home diary:  PREMEAL Breakfast Lunch Dinner Bedtime  Overall   Glucose range: 184-258 191-288 192-308    Median:        Hypoglycemia:   none     Glycemic control:   A1c was 10.5 in 01/2013  Lab Results  Component Value Date   HGBA1C 6.5* 07/07/2011   HGBA1C 6.9* 07/08/2010   HGBA1C  Value: 7.1 (NOTE)                                                                       According to the  ADA Clinical Practice Recommendations for 2011, when HbA1c is used as a screening test:   >=6.5%   Diagnostic of Diabetes Mellitus           (if abnormal result  is confirmed)  5.7-6.4%   Increased risk of developing Diabetes Mellitus  References:Diagnosis and Classification of Diabetes Mellitus,Diabetes Care,2011,34(Suppl 1):S62-S69 and Standards of Medical Care in         Diabetes - 2011,Diabetes Care,2011,34  (Suppl 1):S11-S61.* 03/17/2010   Lab Results  Component Value Date   LDLCALC 100* 07/09/2010   CREATININE 0.63 05/08/2012    Self-care: The diet that the patient has been following is: tries to limit fats     Meals: 3 meals per day. Needs frequent meals and larger portions; no hs snacks          Exercise: Walk daily 20-30 min daily         Dietician visit:  Most recent:2014 ( class).               Compliance with the medical regimen: Good  Retinal exam: Most recent:.9/14    Weight history: Wt Readings from Last 3 Encounters:  06/14/13 216 lb 12.8 oz (98.34 kg)  07/06/11 188 lb (85.276 kg)      Medication List       This list is accurate as of: 06/14/13  9:37 AM.  Always use your most recent med list.               aspirin EC 81 MG tablet  Take 162 mg by mouth daily.     AZOR 10-40 MG per tablet  Generic drug:  amLODipine-olmesartan  Take 1 tablet by mouth daily.     carbamazepine 200 MG 12 hr capsule  Commonly known as:  CARBATROL  Take 200-400 mg by mouth 2 (two) times daily. Takes 1 capsule in the morning and 2 capsules at bedtime     donepezil 10 MG tablet  Commonly known as:  ARICEPT  Take 10 mg by mouth at bedtime.     esomeprazole 40 MG capsule  Commonly known as:  NEXIUM  Take 40 mg by mouth daily before breakfast. Acid reflux     folic acid 1 MG tablet  Commonly known as:  FOLVITE  Take 1 mg by mouth daily.     hydrOXYzine 25 MG tablet  Commonly known as:  ATARAX/VISTARIL  Take 25-50 mg by mouth every 6 (six) hours as needed for itching.      LEVEMIR FLEXTOUCH [place]  Inject into the skin. 52 units in am and 15 units at bedtime     levothyroxine 75 MCG tablet  Commonly known as:  SYNTHROID, LEVOTHROID  Take 75 mcg by mouth daily.     meclizine 25 MG tablet  Commonly known as:  ANTIVERT  Take 1 tablet (25 mg total) by mouth 3 (three) times daily as needed.     methotrexate 2.5 MG tablet  Commonly known as:  RHEUMATREX  Take 7.5 mg by mouth once a week. Pt takes 2 tablets for 7.5mg  dose. Pt takes on Thursday of each week. Caution:Chemotherapy. Protect from light.     nitroGLYCERIN 0.4 MG SL tablet  Commonly known as:  NITROSTAT  Place 0.4 mg under the tongue every 5 (five) minutes as needed.     NOVOLOG FLEXPEN   Inject 4-6 Units into the skin.     ondansetron 8 MG tablet  Commonly known as:  ZOFRAN  Take 1 tablet (8 mg total) by mouth every 8 (eight) hours as needed for nausea.     TEKTURNA HCT 300-12.5 MG Tabs  Generic drug:  Aliskiren-Hydrochlorothiazide  Take 0.5 tablets by mouth every evening.     TOVIAZ 8 MG Tb24 tablet  Generic drug:  fesoterodine  Take 8 mg by mouth daily.     Vitamin D 2000 UNITS tablet  Take 2,000 Units by mouth daily.        Allergies:  Allergies  Allergen Reactions  . Flagyl [Metronidazole Hcl] Itching and Rash  . Metformin And Related Nausea And Vomiting  . Ciprofloxacin Itching and Rash    Past Medical History  Diagnosis Date  . Thyroid disease   . Hypertension   . Ischemic colitis, enteritis, or enterocolitis   . Sleep apnea   . Urinary bladder incontinence   . GERD (gastroesophageal reflux disease)   . Hyperlipidemia   . Diabetes mellitus  diet controlled  . Stroke     x's 2  . Depression     Bipolar    Past Surgical History  Procedure Laterality Date  . Retinal detachment surgery    . Appendectomy    . Tonsillectomy    . Hemorrhoid surgery    . Fracture surgery    . Cholecystectomy    . Abdominal hysterectomy      partial     Family History   Problem Relation Age of Onset  . Heart disease Mother   . Stroke Father   . Cancer Sister   . Cancer Brother     Social History:  reports that she has never smoked. She has never used smokeless tobacco. She reports that she does not drink alcohol or use illicit drugs.    Review of Systems       Lipids: Last LDL from PCP is 88 in 1/15       Lab Results  Component Value Date   CHOL 187 07/09/2010   HDL 64 07/09/2010   LDLCALC 100* 07/09/2010   TRIG 113 07/09/2010   CHOLHDL 2.9 07/09/2010        She has gained weight over the last 2 years               Skin: No rash or infections     Thyroid:  Gets fatigue. Has had presumed hypothyroidism for over 20 years, has not had a change in medications in several years and last TSH was 1.9     The blood pressure has been controlled with amlodipine/Benicar and also Tekturna HCT     She has mild stable swelling of lower legs especially left.     No shortness of breath on exertion.     Bowel habits: Normal. Rare nausea       No frequency of urination or nocturia       No joint  pains.      Has had controlled depression, previously on Zyprexa and Geodon       Left sided TIAs 4-5 years ago, only on aspirin         No history of Numbness, tingling or burning in feet, occasionally may have pinpricks  in the legs   LABS:  Her last serum creatinine was 0.7  Physical Examination:  BP 144/84  Pulse 85  Temp(Src) 98.4 F (36.9 C)  Resp 16  Ht 5\' 3"  (1.6 m)  Wt 216 lb 12.8 oz (98.34 kg)  BMI 38.41 kg/m2  SpO2 93%  GENERAL:         Patient has generalized obesity.   HEENT:         Eye exam shows normal external appearance. Fundus exam shows no retinopathy. Oral exam shows normal mucosa .  NECK:         General:  Neck exam shows no lymphadenopathy. Carotids are normal to palpation and no bruit heard.  Thyroid is not enlarged and no nodules felt.   LUNGS:         Chest is symmetrical. Lungs are clear to auscultation.Marland Kitchen   HEART:          Heart sounds:  S1 and S2 are normal. No murmurs or clicks heard., no S3 or S4.   ABDOMEN:   There is no distention present. Liver and spleen are not palpable. No other mass or tenderness present.  EXTREMITIES:     There is 1+ lower leg edema especially left. No skin lesions present.Marland Kitchen  NEUROLOGICAL:   Vibration sense is moderately reduced in toes. Ankle jerks are absent bilaterally.          Diabetic foot exam:  as in the foot exam section, no sensory decrease and normal pulses MUSCULOSKELETAL:       There is no enlargement or deformity of the joints. Spine is normal to inspection.Marland Kitchen   SKIN:       No rash or lesions of concern.        ASSESSMENT:  Diabetes type 2, uncontrolled    She appears to have significant insulin resistance as well as insulin deficiency with A1c rising over the last year at least and now still around 10% Problems identified:  Persistent hyperglycemia including fasting  She is taking mostly basal insulin with Levemir and not clear this is lasting 24 hours  Not getting enough mealtime insulin regularly she is taking very small doses and probably not consistently  Not able to assess her post prandial reading after dinner  She is not able to control her portions or daytime food intake because of increased hunger  Continued weight gain with insulin  Complications: None evident  Other medical problems including hypertension, hypothyroidism, hyperlipidemia and depression appear to be well controlled  PLAN:  She is a good candidate for a GLP-1 drug. Discussed with the patient the nature of GLP-1 drugs, the action on various organ systems, how they benefit blood glucose control, as well as the benefit of weight loss and  increase satiety . Explained possible side effects especially nausea and vomiting; discussed safety information in package insert. Described injection technique and dosage titration of Victoza  starting with 0.6 mg once a day at the same time for the  first week and then increasing to 1.2 mg if no symptoms of nausea. Patient brochure on Victoza given Change Levemir to 30 units twice a day Take at least 10 units of NovoLog with each meal including breakfast Check at least 2 or 3 readings per week after dinner To bring blood sugar monitor for download  Counseling time over 50% of today's 55 minute visit  Reather Littler 06/14/2013, 9:37 AM   Note: This office note was prepared with Dragon voice recognition system technology. Any transcriptional errors that result from this process are unintentional.

## 2013-06-14 NOTE — Patient Instructions (Signed)
Start VICTOZA injection with the sample pen once daily at the same time of the day preferably at bedtime.  Dial the dose to 0.6 mg for the first week.  You may  experience nausea in the first few days which usually gets better   After 1 week increase the dose to 1.2mg  daily if no nausea.   You may inject in the stomach, thigh or arm.    You will feel fullness of the stomach with starting the medication and should try to keep portions of food small.    Please check blood sugars either before a meal and also at least once a day about 2 hours after any meal  . Please bring blood sugar monitor to each visit  Continue walking daily  LEVEMIR insulin: Take 30 units twice a day, may take at breakfast and supper NOVOLOG insulin 10 units before breakfast, lunch and dinner. If the blood sugar is going down below 100 after meals may reduce the dose to 8 units

## 2013-06-27 ENCOUNTER — Telehealth: Payer: Self-pay | Admitting: Endocrinology

## 2013-06-27 ENCOUNTER — Other Ambulatory Visit: Payer: Self-pay | Admitting: *Deleted

## 2013-06-27 MED ORDER — INSULIN PEN NEEDLE 32G X 4 MM MISC: Status: DC

## 2013-06-27 NOTE — Telephone Encounter (Signed)
rx sent

## 2013-06-27 NOTE — Telephone Encounter (Signed)
Pt needs refill rx for BD ultrafing nano needles called in for the amount she uses daily she uses 8 a day

## 2013-07-05 ENCOUNTER — Ambulatory Visit (INDEPENDENT_AMBULATORY_CARE_PROVIDER_SITE_OTHER): Payer: Medicare Other | Admitting: Endocrinology

## 2013-07-05 ENCOUNTER — Other Ambulatory Visit: Payer: Self-pay | Admitting: Endocrinology

## 2013-07-05 ENCOUNTER — Encounter: Payer: Self-pay | Admitting: Endocrinology

## 2013-07-05 VITALS — BP 120/76 | HR 91 | Temp 98.1°F | Resp 18 | Ht 63.0 in | Wt 218.0 lb

## 2013-07-05 DIAGNOSIS — R5383 Other fatigue: Secondary | ICD-10-CM

## 2013-07-05 DIAGNOSIS — R531 Weakness: Secondary | ICD-10-CM

## 2013-07-05 DIAGNOSIS — IMO0001 Reserved for inherently not codable concepts without codable children: Secondary | ICD-10-CM

## 2013-07-05 DIAGNOSIS — E1165 Type 2 diabetes mellitus with hyperglycemia: Secondary | ICD-10-CM

## 2013-07-05 DIAGNOSIS — R5381 Other malaise: Secondary | ICD-10-CM

## 2013-07-05 DIAGNOSIS — I10 Essential (primary) hypertension: Secondary | ICD-10-CM

## 2013-07-05 LAB — CBC WITH DIFFERENTIAL/PLATELET
BASOS ABS: 0 10*3/uL (ref 0.0–0.1)
Basophils Relative: 0.4 % (ref 0.0–3.0)
EOS ABS: 0.2 10*3/uL (ref 0.0–0.7)
Eosinophils Relative: 2.4 % (ref 0.0–5.0)
HEMATOCRIT: 42.6 % (ref 36.0–46.0)
HEMOGLOBIN: 14.3 g/dL (ref 12.0–15.0)
LYMPHS ABS: 1.7 10*3/uL (ref 0.7–4.0)
Lymphocytes Relative: 25.6 % (ref 12.0–46.0)
MCHC: 33.5 g/dL (ref 30.0–36.0)
MCV: 90.5 fl (ref 78.0–100.0)
MONO ABS: 0.7 10*3/uL (ref 0.1–1.0)
Monocytes Relative: 11 % (ref 3.0–12.0)
NEUTROS ABS: 4 10*3/uL (ref 1.4–7.7)
Neutrophils Relative %: 60.6 % (ref 43.0–77.0)
PLATELETS: 390 10*3/uL (ref 150.0–400.0)
RBC: 4.71 Mil/uL (ref 3.87–5.11)
RDW: 13 % (ref 11.5–15.5)
WBC: 6.6 10*3/uL (ref 4.0–10.5)

## 2013-07-05 LAB — BASIC METABOLIC PANEL
BUN: 13 mg/dL (ref 6–23)
CALCIUM: 9.3 mg/dL (ref 8.4–10.5)
CO2: 27 meq/L (ref 19–32)
CREATININE: 0.8 mg/dL (ref 0.4–1.2)
Chloride: 93 mEq/L — ABNORMAL LOW (ref 96–112)
GFR: 77.12 mL/min (ref >60.00)
Glucose, Bld: 216 mg/dL — ABNORMAL HIGH (ref 70–99)
Potassium: 4.6 mEq/L (ref 3.5–5.1)
SODIUM: 129 meq/L — AB (ref 135–145)

## 2013-07-05 LAB — TSH: TSH: 0.76 u[IU]/mL (ref 0.35–4.50)

## 2013-07-05 NOTE — Patient Instructions (Addendum)
Leave Victoza off for 5 days And stop [place]

## 2013-07-05 NOTE — Progress Notes (Signed)
Patient ID: [name], female   DOB: 1940-12-15, 73 y.o.   MRN: [id number]    Reason for Appointment: Followup for Type 2 Diabetes  History of Present Illness:          Diagnosis: Type 2 diabetes mellitus, date of diagnosis:2011       Past history: Since she was apparently intolerant to metformin she was treated with Onglyza until about 2014  At that time because of increasing A1c of about 8% she was started on Levemir 15 units and this was aggressively increased Onglyza was continued She has not tried any other treatments for diabetes Her A1c has been 10-10.5 in 2015  Recent history:  In 3/15 she was told to start small doses of NovoLog at lunch and supper and add 15 units of Levemir at night also Because of continued significant hyperglycemia with her basal bolus insulin regimen she was given Victoza in addition on her initial consultation in 5/15 Also her Levemir was split to twice a day for better 24-hour coverage since she was having relatively higher readings in the evenings NovoLog doses were increased also to 10 units Her blood sugars have improved progressively and more recently they are fairly close to target No hypoglycemia with this She is tolerating the Victoza except for occasional nausea and she is cutting back on portions Her weight has not changed much       Oral hypoglycemic drugs the patient is taking are: None   Side effects from medications have been: Metformin: rash INSULIN regimen is described as: Novolog acl, acs 10; Levemir 30 twice a day  Glucose monitoring:  done 1-3 time a day         Glucometer: ? One Touch.      Blood Glucose readings from home diary:  PREMEAL Breakfast Lunch Dinner Bedtime Overall  Glucose range:  131-141   88-195   100-171   120-214    Mean/median:  139     170   141    Hypoglycemia:   none     Glycemic control:   A1c was 10.5 in 01/2013  Lab Results  Component Value Date   HGBA1C 6.5* 07/07/2011   HGBA1C 6.9*  07/08/2010   HGBA1C  Value: 7.1 (NOTE)                                                                       According to the ADA Clinical Practice Recommendations for 2011, when HbA1c is used as a screening test:   >=6.5%   Diagnostic of Diabetes Mellitus           (if abnormal result  is confirmed)  5.7-6.4%   Increased risk of developing Diabetes Mellitus  References:Diagnosis and Classification of Diabetes Mellitus,Diabetes Care,2011,34(Suppl 1):S62-S69 and Standards of Medical Care in         Diabetes - 2011,Diabetes Care,2011,34  (Suppl 1):S11-S61.* 03/17/2010   Lab Results  Component Value Date   LDLCALC 100* 07/09/2010   CREATININE 0.63 05/08/2012    Self-care: The diet that the patient has been following is: tries to limit fats     Meals: 3 meals per day. Recently cutting back on frequent meals and  portions; no hs snacks  Exercise: Walk daily 20-30 min until recently         Dietician visit: Most recent:2014 ( class).               Compliance with the medical regimen: Good  Retinal exam: Most recent:.9/14    Weight history: Wt Readings from Last 3 Encounters:  07/05/13 218 lb (98.884 kg)  06/14/13 216 lb 12.8 oz (98.34 kg)  07/06/11 188 lb (85.276 kg)      Medication List       This list is accurate as of: 07/05/13  9:29 AM.  Always use your most recent med list.               aspirin EC 81 MG tablet  Take 162 mg by mouth daily.     AZOR 10-40 MG per tablet  Generic drug:  amLODipine-olmesartan  Take 1 tablet by mouth daily.     carbamazepine 200 MG 12 hr capsule  Commonly known as:  CARBATROL  Take 200-400 mg by mouth 2 (two) times daily. Takes 1 capsule in the morning and 2 capsules at bedtime     diltiazem 180 MG 24 hr capsule  Commonly known as:  CARDIZEM CD  Take 180 mg by mouth daily.     donepezil 10 MG tablet  Commonly known as:  ARICEPT  Take 10 mg by mouth at bedtime.     esomeprazole 40 MG capsule  Commonly known as:  NEXIUM  Take 40 mg  by mouth daily before breakfast. Acid reflux     folic acid 1 MG tablet  Commonly known as:  FOLVITE  Take 1 mg by mouth daily.     hydrOXYzine 25 MG tablet  Commonly known as:  ATARAX/VISTARIL  Take 25-50 mg by mouth every 6 (six) hours as needed for itching.     insulin aspart 100 UNIT/ML FlexPen  Commonly known as:  NOVOLOG FLEXPEN  Inject 10 units before breakfast, lunch and dinner every day.     Insulin Pen Needle 32G X 4 MM Misc  Use 8 pen needles per day     LEVEMIR FLEXTOUCH [place]  Inject 30 Units into the skin 2 (two) times daily.     levothyroxine 75 MCG tablet  Commonly known as:  SYNTHROID, LEVOTHROID  Take 75 mcg by mouth daily.     Liraglutide 18 MG/3ML Sopn  Commonly known as:  VICTOZA  Inject 1.2 mg daily     meclizine 25 MG tablet  Commonly known as:  ANTIVERT  Take 1 tablet (25 mg total) by mouth 3 (three) times daily as needed.     methotrexate 2.5 MG tablet  Commonly known as:  RHEUMATREX  Take 7.5 mg by mouth once a week. Pt takes 2 tablets for 7.5mg  dose. Pt takes on Thursday of each week. Caution:Chemotherapy. Protect from light.     nitroGLYCERIN 0.4 MG SL tablet  Commonly known as:  NITROSTAT  Place 0.4 mg under the tongue every 5 (five) minutes as needed.     ondansetron 8 MG tablet  Commonly known as:  ZOFRAN  Take 1 tablet (8 mg total) by mouth every 8 (eight) hours as needed for nausea.     TEKTURNA HCT 300-12.5 MG Tabs  Generic drug:  [place]-Hydrochlorothiazide  Take 0.5 tablets by mouth every evening.     TOVIAZ 8 MG Tb24 tablet  Generic drug:  fesoterodine  Take 8 mg by mouth daily.     Vitamin D 2000 UNITS tablet  Take 2,000 Units by mouth daily.        Allergies:  Allergies  Allergen Reactions  . Flagyl [Metronidazole Hcl] Itching and Rash  . Metformin And Related Rash  . Ciprofloxacin Itching and Rash    Past Medical History  Diagnosis Date  . Thyroid disease   . Hypertension   . Ischemic colitis, enteritis, or  enterocolitis   . Sleep apnea   . Urinary bladder incontinence   . GERD (gastroesophageal reflux disease)   . Hyperlipidemia   . Diabetes mellitus     diet controlled  . Stroke     x's 2  . Depression     Bipolar    Past Surgical History  Procedure Laterality Date  . Retinal detachment surgery    . Appendectomy    . Tonsillectomy    . Hemorrhoid surgery    . Fracture surgery    . Cholecystectomy    . Abdominal hysterectomy      partial     Family History  Problem Relation Age of Onset  . Heart disease Mother   . Stroke Father   . Cancer Sister   . Cancer Brother     Social History:  reports that she has never smoked. She has never used smokeless tobacco. She reports that she does not drink alcohol or use illicit drugs.    Review of Systems    For the last week the patient has been complaining of extreme fatigue and weakness. She says she has difficulty getting out of bed and is not able to even do her own cooking. She may feel a little lightheaded but not vertiginous. She does not think she had any fever or body aches, no new symptoms except a little nausea. Has not started any new medications recently She had labs done from her PCP about 6 weeks ago which showed normal chemistry and hemoglobin levels      Lipids: Last LDL from PCP is 88 in 1/15       Lab Results  Component Value Date   CHOL 187 07/09/2010   HDL 64 07/09/2010   LDLCALC 100* 07/09/2010   TRIG 113 07/09/2010   CHOLHDL 2.9 07/09/2010       Thyroid:  Gets fatigue. Has had presumed hypothyroidism for over 20 years, has not had a change in medications in several years and last TSH was 1.9     The blood pressure has been controlled with amlodipine/Benicar and also apparently diltiazem was added for high blood pressure by PCP a few weeks ago. Still taking Tekturna HCT   LABS:  Her last serum creatinine was 0.7  Physical Examination:  BP 156/70  Pulse 91  Temp(Src) 98.1 F (36.7 C)  Resp 18   Ht 5\' 3"  (1.6 m)  Wt 218 lb (98.884 kg)  BMI 38.63 kg/m2  SpO2 96%   Large cuff    standing blood pressure 120/76    ASSESSMENT:  Diabetes type 2, uncontrolled    She has had dramatic improvement in her blood sugar control with adding Victoza Also has had increased insulin doses including mealtime coverage as well as changing the Levemir to twice a day Previously her A1c was around 10% and she was also gaining weight. She has better control of her portion control with Victoza Overall blood sugars are looking fairly good with mild variability and fasting readings are about 130 She has only mild nausea with Victoza However not clear if this is related to her symptoms  of weakness  WEAKNESS: She feels unusually weak and fatigued for the last week or so. Does not appear to have any systemic symptoms otherwise and unlikely to be related to Victoza. She may have some tendency to orthostatic hypotension as a cause of her weakness Otherwise would also need to rule out anemia or electrolyte imbalance  PLAN:  She will hold her Victoza for about 5 days to see if her weakness resolves otherwise continue Check CBC and electrolytes Reduce her regimen for hypertension, will hold her diltiazem and have her followup with PCP  Counseling time over 50% of today's 25 minute visit  KUMAR,AJAY 07/05/2013, 9:29 AM   Note: This office note was prepared with Insurance underwriterDragon voice recognition system technology. Any transcriptional errors that result from this process are unintentional.

## 2013-07-05 NOTE — Progress Notes (Signed)
Quick Note:  Please let patient know that the sodium level is low, will need to stop the HCTZ in her Tekturna combination and take Tekturna by itself. Other tests are okay. Prefer that she request her PCP to do this otherwise we can do it. Send copies of labs to PCP ______

## 2013-07-06 ENCOUNTER — Other Ambulatory Visit (INDEPENDENT_AMBULATORY_CARE_PROVIDER_SITE_OTHER): Payer: Medicare Other

## 2013-07-06 DIAGNOSIS — IMO0001 Reserved for inherently not codable concepts without codable children: Secondary | ICD-10-CM

## 2013-07-06 DIAGNOSIS — E1165 Type 2 diabetes mellitus with hyperglycemia: Principal | ICD-10-CM

## 2013-07-06 LAB — COMPREHENSIVE METABOLIC PANEL
ALK PHOS: 105 U/L (ref 39–117)
ALT: 31 U/L (ref 0–35)
AST: 25 U/L (ref 0–37)
Albumin: 3.9 g/dL (ref 3.5–5.2)
BILIRUBIN TOTAL: 0.4 mg/dL (ref 0.2–1.2)
BUN: 11 mg/dL (ref 6–23)
CO2: 29 mEq/L (ref 19–32)
Calcium: 9.1 mg/dL (ref 8.4–10.5)
Chloride: 94 mEq/L — ABNORMAL LOW (ref 96–112)
Creatinine, Ser: 0.7 mg/dL (ref 0.4–1.2)
GFR: 85.96 mL/min (ref >60.00)
GLUCOSE: 134 mg/dL — AB (ref 70–99)
Potassium: 4.8 mEq/L (ref 3.5–5.1)
SODIUM: 130 meq/L — AB (ref 135–145)
TOTAL PROTEIN: 6.7 g/dL (ref 6.0–8.3)

## 2013-07-06 LAB — HEMOGLOBIN A1C: HEMOGLOBIN A1C: 9.5 % — AB (ref 4.6–6.5)

## 2013-07-11 ENCOUNTER — Other Ambulatory Visit: Payer: Self-pay | Admitting: Endocrinology

## 2013-07-24 ENCOUNTER — Other Ambulatory Visit (HOSPITAL_COMMUNITY): Payer: Self-pay | Admitting: Internal Medicine

## 2013-07-24 DIAGNOSIS — K3184 Gastroparesis: Secondary | ICD-10-CM

## 2013-07-30 ENCOUNTER — Encounter (HOSPITAL_COMMUNITY)
Admission: RE | Admit: 2013-07-30 | Discharge: 2013-07-30 | Disposition: A | Discharge status: Home / Self Care | Payer: Medicare Other | Source: Ambulatory Visit | Attending: Internal Medicine | Admitting: Internal Medicine

## 2013-07-30 DIAGNOSIS — K3184 Gastroparesis: Secondary | ICD-10-CM

## 2013-07-30 MED ORDER — TECHNETIUM TC 99M SULFUR COLLOID
2.0000 | Freq: Once | INTRAVENOUS | Status: AC | PRN
  Administered 2013-07-30: 2 via INTRAVENOUS

## 2013-08-02 ENCOUNTER — Encounter: Payer: Self-pay | Admitting: Endocrinology

## 2013-08-02 ENCOUNTER — Ambulatory Visit (INDEPENDENT_AMBULATORY_CARE_PROVIDER_SITE_OTHER): Payer: Medicare Other | Admitting: Endocrinology

## 2013-08-02 VITALS — BP 158/93 | HR 82 | Temp 98.1°F | Resp 16 | Ht 63.0 in | Wt 218.8 lb

## 2013-08-02 DIAGNOSIS — R5381 Other malaise: Secondary | ICD-10-CM

## 2013-08-02 DIAGNOSIS — R531 Weakness: Secondary | ICD-10-CM

## 2013-08-02 DIAGNOSIS — IMO0001 Reserved for inherently not codable concepts without codable children: Secondary | ICD-10-CM

## 2013-08-02 DIAGNOSIS — E039 Hypothyroidism, unspecified: Secondary | ICD-10-CM

## 2013-08-02 DIAGNOSIS — R5383 Other fatigue: Secondary | ICD-10-CM

## 2013-08-02 DIAGNOSIS — E1165 Type 2 diabetes mellitus with hyperglycemia: Principal | ICD-10-CM

## 2013-08-02 MED ORDER — ALBIGLUTIDE 30 MG ~~LOC~~ PEN: 30.0000 mg | PEN_INJECTOR | SUBCUTANEOUS | Status: DC

## 2013-08-02 NOTE — Patient Instructions (Addendum)
Tanzeum 30mg  weekly  Levemir 30 in am and 35 in pm  Check BP weekly

## 2013-08-02 NOTE — Progress Notes (Signed)
Patient ID: [name], female   DOB: 07/18/1940, 73 y.o.   MRN: [id number]    Reason for Appointment: Followup for Type 2 Diabetes  History of Present Illness:          Diagnosis: Type 2 diabetes mellitus, date of diagnosis:2011       Past history: Since [name] was apparently intolerant to metformin [name] was treated with Onglyza until about 2014  At that time because of increasing A1c of about 8% [name] was started on Levemir 15 units and this was aggressively increased Onglyza was continued [name] has not tried any other treatments for diabetes [name] A1c has been 10-10.5 in 2015 In 3/15 [name] was told to start small doses of NovoLog at lunch and supper and add 15 units of Levemir at night also  Recent history:  To control  hyperglycemia with [name] basal bolus insulin regimen [name] was given Victoza in addition on [name] initial consultation in 5/15 Also [name] Levemir was split to twice a day for better 24-hour coverage since [name] was having relatively higher readings in the evenings NovoLog doses were increased also to 10 units [name] blood sugars  improved progressively and on [name] last visit were fairly close to target However because [name] was complaining of generalized weakness [name] was told to hold off [name] Victoza for a few days to see if this got better [name] did not restart Victoza even though [name] continues to have this symptom of weakness [name] blood sugars have gone back up significantly and are averaging 190 now without any change in insulin dose [name] says [name] is compliant with [name] mealtime insulin       Oral hypoglycemic drugs the patient is taking are: None   Side effects from medications have been: Metformin: rash INSULIN regimen is described as: Novolog acl, acs 10; Levemir 30 twice a day  Glucose monitoring:  done 1-3 time a day         Glucometer:  One Touch.      Blood Glucose readings from home glucose meter download:  PREMEAL Breakfast Lunch Dinner  p.m.  Overall  Glucose range:  121-245    138-175   130-300   258, 224    Median:  209   162     175    Hypoglycemia:   none     Glycemic control:   A1c was 10.5 in 01/2013  Lab Results  Component Value Date   HGBA1C 9.5* 07/06/2013   HGBA1C 6.5* 07/07/2011   HGBA1C 6.9* 07/08/2010   Lab Results  Component Value Date   LDLCALC 100* 07/09/2010   CREATININE 0.7 07/06/2013    Self-care: The diet that the patient has been following is: tries to limit fats     Meals: 3 meals per day. Recently cutting back on frequent meals and  portions; no hs snacks          Exercise: Walk daily 20-30 min until recently         Dietician visit: Most recent:2014 ( class).               Compliance with the medical regimen: Good  Retinal exam: Most recent:.9/14    Weight history: Wt Readings from Last 3 Encounters:  08/02/13 218 lb 12.8 oz (99.247 kg)  07/05/13 218 lb (98.884 kg)  06/14/13 216 lb 12.8 oz (98.34 kg)      Medication List       This list is accurate as of: 08/02/13  9:31  PM.  Always use your most recent med list.               Albiglutide 30 MG Pen  Commonly known as:  TANZEUM  Inject 30 mg into the skin once a week.     Alpha-Lipoic Acid 300 MG Caps  Take 300 mg by mouth.     aspirin EC 81 MG tablet  Take 162 mg by mouth daily.     AZOR 10-40 MG per tablet  Generic drug:  amLODipine-olmesartan  Take 1 tablet by mouth daily.     carbamazepine 200 MG 12 hr capsule  Commonly known as:  CARBATROL  Take 200-400 mg by mouth 2 (two) times daily. Takes 1 capsule in the morning and 2 capsules at bedtime     donepezil 10 MG tablet  Commonly known as:  ARICEPT  Take 10 mg by mouth at bedtime.     esomeprazole 40 MG capsule  Commonly known as:  NEXIUM  Take 40 mg by mouth daily before breakfast. Acid reflux     folic acid 1 MG tablet  Commonly known as:  FOLVITE  Take 1 mg by mouth daily.     hydrOXYzine 25 MG tablet  Commonly known as:  ATARAX/VISTARIL  Take 25-50 mg by mouth every 6 (six) hours as needed  for itching.     insulin aspart 100 UNIT/ML FlexPen  Commonly known as:  NOVOLOG FLEXPEN  Inject 10 units before breakfast, lunch and dinner every day.     Insulin Pen Needle 32G X 4 MM Misc  Use 8 pen needles per day     LEVEMIR FLEXTOUCH   Inject 30 Units into the skin 2 (two) times daily.     levothyroxine 75 MCG tablet  Commonly known as:  SYNTHROID, LEVOTHROID  Take 75 mcg by mouth daily.     meclizine 25 MG tablet  Commonly known as:  ANTIVERT  Take 1 tablet (25 mg total) by mouth 3 (three) times daily as needed.     methotrexate 2.5 MG tablet  Commonly known as:  RHEUMATREX  Take 7.5 mg by mouth once a week. Pt takes 2 tablets for 7.5mg  dose. Pt takes on Thursday of each week. Caution:Chemotherapy. Protect from light.     nitroGLYCERIN 0.4 MG SL tablet  Commonly known as:  NITROSTAT  Place 0.4 mg under the tongue every 5 (five) minutes as needed.     ondansetron 8 MG tablet  Commonly known as:  ZOFRAN  Take 1 tablet (8 mg total) by mouth every 8 (eight) hours as needed for nausea.     TOVIAZ 8 MG Tb24 tablet  Generic drug:  fesoterodine  Take 8 mg by mouth daily.     Vitamin D 2000 UNITS tablet  Take 2,000 Units by mouth daily.        Allergies:  Allergies  Allergen Reactions  . Flagyl [Metronidazole Hcl] Itching and Rash  . Metformin And Related Rash  . Ciprofloxacin Itching and Rash    Past Medical History  Diagnosis Date  . Thyroid disease   . Hypertension   . Ischemic colitis, enteritis, or enterocolitis   . Sleep apnea   . Urinary bladder incontinence   . GERD (gastroesophageal reflux disease)   . Hyperlipidemia   . Diabetes mellitus     diet controlled  . Stroke     x's 2  . Depression     Bipolar    Past Surgical History  Procedure Laterality Date  .  Retinal detachment surgery    . Appendectomy    . Tonsillectomy    . Hemorrhoid surgery    . Fracture surgery    . Cholecystectomy    . Abdominal hysterectomy      partial      Family History  Problem Relation Age of Onset  . Heart disease Mother   . Stroke Father   . Cancer Sister   . Cancer Brother     Social History:  reports that [name] has never smoked. [name] has never used smokeless tobacco. [name] reports that [name] does not drink alcohol or use illicit drugs.    Review of Systems   [name] again complains of marked malaise and weakness and has seen [name] PCP, no etiology evident.      Lipids: Last LDL from PCP is 88 in 1/15       Lab Results  Component Value Date   CHOL 187 07/09/2010   HDL 64 07/09/2010   LDLCALC 100* 07/09/2010   TRIG 113 07/09/2010   CHOLHDL 2.9 07/09/2010       Thyroid:  Gets fatigue. Has had presumed hypothyroidism for over 20 years, has not had a change in medications in several years and last TSH was 1.9 from PCP     The blood pressure has been controlled with amlodipine/Benicar and now not taking diltiazem which was previously was added for high blood pressure by PCP. Still taking Tekturna without the HCTZ because [name] had mild hyponatremia    Physical Examination:  BP 158/93  Pulse 82  Temp(Src) 98.1 F (36.7 C)  Resp 16  Ht 5\' 3"  (1.6 m)  Wt 218 lb 12.8 oz (99.247 kg)  BMI 38.77 kg/m2  SpO2 97%  No pedal edema    ASSESSMENT:  Diabetes type 2, uncontrolled   [name] blood sugars have gone up significantly with stopping Victoza [name] has had nonspecific malaise and nausea which appears to be not related to [name] Victoza Most likely [name] will benefit from using a GLP-1 drug along with [name] basal bolus insulin regimen which is not effective Reassured [name] that Victoza would be safe and effective However since [name] continues to have some nausea and reflux-like symptoms will have [name] try Tanzeum instead This may cause less nausea but [name] will call if [name] has any difficulties May need to get prior optimization for this product Demonstrated to [name] how to use the Tanzeum device  Hypertension: Blood pressure appears to be higher with  stopping HCTZ and diltiazem and will defer management to PCP  Weakness: Etiology unclear, apparently is not related to hypothyroidism or electrolyte imbalance  PLAN:  Tanzeum 30 mg weekly  Have discussed blood sugar targets; for now will increase [name] evening Levemir by 5 units to help control overnight hyperglycemia, may need to reduce his back again Followup with PCP for other issues including further evaluation of [name] continued nonspecific weakness  Counseling time over 50% of today's 25 minute visit  [name] 08/02/2013, 9:31 PM   Note: This office note was prepared with Dragon voice recognition system technology. Any transcriptional errors that result from this process are unintentional.

## 2013-08-08 ENCOUNTER — Other Ambulatory Visit: Payer: Self-pay | Admitting: Endocrinology

## 2013-08-15 ENCOUNTER — Encounter: Payer: Medicare Other | Attending: Internal Medicine | Admitting: Dietician

## 2013-08-15 ENCOUNTER — Encounter: Payer: Self-pay | Admitting: Dietician

## 2013-08-15 VITALS — Ht 63.0 in | Wt 219.0 lb

## 2013-08-15 DIAGNOSIS — Z794 Long term (current) use of insulin: Secondary | ICD-10-CM | POA: Insufficient documentation

## 2013-08-15 DIAGNOSIS — Z713 Dietary counseling and surveillance: Secondary | ICD-10-CM | POA: Insufficient documentation

## 2013-08-15 DIAGNOSIS — E119 Type 2 diabetes mellitus without complications: Secondary | ICD-10-CM | POA: Insufficient documentation

## 2013-08-15 DIAGNOSIS — E1165 Type 2 diabetes mellitus with hyperglycemia: Secondary | ICD-10-CM

## 2013-08-15 DIAGNOSIS — IMO0001 Reserved for inherently not codable concepts without codable children: Secondary | ICD-10-CM

## 2013-08-15 NOTE — Progress Notes (Signed)
Medical Nutrition Therapy:  Appt start time: 1043 end time:  1200.   Assessment:  Primary concerns today: Diabetes. **Patient mentioned that she would like to change Power of Attorney to read DNR. Has tried to limit portions but is hungry. Took a diabetes class at [facility] - 2-3 classes. Stopped taking Victoza one month ago - patient feels that she was allergic to it - nauseated and bloated and headache- took Zofran as a result. Results from [facility] stomach emptying test "came back fine." Feels that cooking for one "is the pits!" And is the only reason why she would consider going into a home.  Patient reports that from noon until evening (goes to be with it) that her stomach does hurt. Wakes nauseated and really hungry. Usually has a headache in the evening - for past 6 months  Per diet recall and review of food journal from June, patient's diet is inadequate in carbohydrate. Patient often consumes as little as 15 grams of carbohydrate in a day. Patient had misinformation about what foods have carbohydrates in them, what foods should count towards carbohydrate servings (ex: avoided carrots and tomatoes), and how much carbohydrate she should have in a day. Patient is very conscientious about portion sizes, recording blood sugar readings etc. Based on patient report and records, elevated blood sugars are not related to diet.  Blood Sugars:  Before meals: 177-245 over past 2 weeks In reviewing blood sugar readings from June, patient's blood sugar reading pre-and post-prandial were well controlled on Victoza.   Preferred Learning Style:    No preference indicated   Learning Readiness:   Not ready  Contemplating  Ready  Change in progress   MEDICATIONS: See chart   DIETARY INTAKE:  Usual eating pattern includes 3 meals and 1-2 snacks per day.  Everyday foods include Meats and vegetables.  Avoided foods include carrots bc of sugar, allergies - milk and peanuts.  Tomato - only  allows herself one slice - bc of sugar, potatoes, pasta, rice, bread.  24-hr recall:  B (7-9 AM): Coffee with lactose tolerant milk. Most of time 2 Eggs and occasionally 45 calorie toast OR rolled oats and splenda -  1/2 cup OR cereal (cheerios - doesn't like bran type) with lactose tolerant milk. Makes her own quiche with broccoli Snk ( AM): none  L (11:15 PM): 3 hours after breakfast - sometimes goes out - [employer] teriaki and excludes rice and sauce and grilled vegetables - peppers, onions, snow peas. [place] - cerviche, or meat and vegetable - grilled always. Salmon, scallops. Very little beef. Makes her own chili with ground beef, 93%, tomatoes with seasoning packet and black beans - feels that she shouldn't use packet. Says she is "allowed to have" lima beans, pinto beans, broccoli. Dr. Ricki Miller gave her a list of of "good, better and best" foods. Snk ( PM): Slice of [place], green peppers, cucumbers, romaine lettuce, ocaissional stick of bacon - Loves BLTs and lets herself have them "when she's been good" - uses lettuce instead of bread. D ( PM): Similar to lunch - eats a lot of lima beans.Loves chow chow - mother makes her some without sugar. Snk ( PM): Strip of lettuce, chicken, [place], sometimes natural peanut butter on a cracker-saltine without salt Beverages: Coffee & water - keeps water with her all day  Usual physical activity: "going to the mailbox and back" Sweats a lot, really hates exercising bc she hates to sweat, went to [employer], [employer] - water  yoga, walking, and aerobics maybe? - all "got old." Tried ACT - 6 months and quit. Likes walking in the winter. Someone does house keeping etc for her.  Estimated energy needs: 1800 calories 200 g carbohydrates 135 g protein 50 g fat  Progress Towards Goal(s):  No progress.   Nutritional Diagnosis:  NB-1.1 Food and nutrition-related knowledge deficit As related to misinformation about carbohydrate servings.  As evidenced  by patient report-diet recall & food journal.    Intervention:  Nutrition Education and Counseling. Patient needs to incorporate more high quality carbohydrate servings into diet such as fruit, beans and whole grains to increase her overall daily intake. We discussed what foods have carbohydrate in them and what foods do not. We reviewed carbohydrate counting with food models and portion sizes and also by using MyPlate for diabetes.   Plan:  Aim for 2-3 Carb Choices per meal (30-45 grams) +/- 1 either way  Aim for 0-1 (15 grams) Carbs per snack if hungry Consider including protein with meals and snacks Consider reading food labels for Total Carbohydrate and Fat Grams of foods Consider  increasing your activity level by 15-30 minutes daily as tolerated Continue eating so many vegetables - wonderful job  Teaching Method Utilized:  Armed forces training and education officerVisual Auditory Hands on  Handouts given during visit include:  Yellow Card  MyPlate for diabetes  Snack sheet  Barriers to learning/adherence to lifestyle change: None  Demonstrated degree of understanding via:  Teach Back   Monitoring/Evaluation:  Dietary intake, exercise, carbohydrate counting, and body weight prn.

## 2013-08-15 NOTE — Patient Instructions (Signed)
Plan:  Aim for 2-3 Carb Choices per meal (30-45 grams) +/- 1 either way  Aim for 0-1 (15 grams) Carbs per snack if hungry Consider including protein with meals and snacks Consider reading food labels for Total Carbohydrate and Fat Grams of foods Consider  increasing your activity level by 15-30 minutes daily as tolerated Continue eating so many vegetables - wonderful job

## 2013-08-28 ENCOUNTER — Other Ambulatory Visit (INDEPENDENT_AMBULATORY_CARE_PROVIDER_SITE_OTHER): Payer: Medicare Other

## 2013-08-28 DIAGNOSIS — E1165 Type 2 diabetes mellitus with hyperglycemia: Principal | ICD-10-CM

## 2013-08-28 DIAGNOSIS — IMO0001 Reserved for inherently not codable concepts without codable children: Secondary | ICD-10-CM

## 2013-08-28 DIAGNOSIS — E039 Hypothyroidism, unspecified: Secondary | ICD-10-CM

## 2013-08-28 LAB — COMPREHENSIVE METABOLIC PANEL
ALT: 29 U/L (ref 0–35)
AST: 27 U/L (ref 0–37)
Albumin: 4 g/dL (ref 3.5–5.2)
Alkaline Phosphatase: 109 U/L (ref 39–117)
BUN: 13 mg/dL (ref 6–23)
CALCIUM: 9.4 mg/dL (ref 8.4–10.5)
CHLORIDE: 100 meq/L (ref 96–112)
CO2: 24 meq/L (ref 19–32)
CREATININE: 0.9 mg/dL (ref 0.4–1.2)
GFR: 69.81 mL/min (ref >60.00)
Glucose, Bld: 135 mg/dL — ABNORMAL HIGH (ref 70–99)
POTASSIUM: 5 meq/L (ref 3.5–5.1)
Sodium: 139 mEq/L (ref 135–145)
Total Bilirubin: 0.3 mg/dL (ref 0.2–1.2)
Total Protein: 7.3 g/dL (ref 6.0–8.3)

## 2013-08-28 LAB — T4, FREE: FREE T4: 0.91 ng/dL (ref 0.60–1.60)

## 2013-08-28 LAB — TSH: TSH: 0.51 u[IU]/mL (ref 0.35–4.50)

## 2013-08-31 ENCOUNTER — Ambulatory Visit (INDEPENDENT_AMBULATORY_CARE_PROVIDER_SITE_OTHER): Payer: Medicare Other | Admitting: Endocrinology

## 2013-08-31 ENCOUNTER — Encounter: Payer: Self-pay | Admitting: Endocrinology

## 2013-08-31 VITALS — BP 158/83 | HR 95 | Temp 97.8°F | Resp 16 | Ht 63.0 in | Wt 223.0 lb

## 2013-08-31 DIAGNOSIS — E039 Hypothyroidism, unspecified: Secondary | ICD-10-CM

## 2013-08-31 DIAGNOSIS — E1165 Type 2 diabetes mellitus with hyperglycemia: Principal | ICD-10-CM

## 2013-08-31 DIAGNOSIS — I10 Essential (primary) hypertension: Secondary | ICD-10-CM

## 2013-08-31 DIAGNOSIS — IMO0001 Reserved for inherently not codable concepts without codable children: Secondary | ICD-10-CM

## 2013-08-31 LAB — FRUCTOSAMINE: Fructosamine: 260 umol/L (ref 190–270)

## 2013-08-31 NOTE — Patient Instructions (Addendum)
Levemir same, call if am sugar stays > 140  Novolog 10 in am, 7-8 at lunch and 12 at supper  Victoza 0.6mg  1x daily for 3 days then 1.2 mg daily

## 2013-08-31 NOTE — Progress Notes (Signed)
Patient ID: [name], female   DOB: 1940/07/29, 73 y.o.   MRN: [id number]    Reason for Appointment: Followup for Type 2 Diabetes  History of Present Illness:          Diagnosis: Type 2 diabetes mellitus, date of diagnosis:2011       Past history: Since she was apparently intolerant to metformin she was treated with Onglyza until about 2014  At that time because of increasing A1c of about 8% she was started on Levemir 15 units and this was aggressively increased Onglyza was continued She has not tried any other treatments for diabetes Her A1c has been 10-10.5 in 2015 In 3/15 she was told to start small doses of NovoLog at lunch and supper and add 15 units of Levemir at night also  Recent history:  To control  hyperglycemia with her basal bolus insulin regimen she was given Victoza in addition on her initial consultation in 5/15 Also her Levemir was split to twice a day for better 24-hour coverage since she was having relatively higher readings in the evenings NovoLog doses were increased also to 10 units Her blood sugars had improved progressively and  were fairly close to target She tried to go off Victoza because of weakness and blood sugars went back up again She was tried on Tanzeum instead since her last visit in 7/16 and her blood sugars are starting to improve again, averaging 142 recently compared to 190 on her last visit She has taken the Tanzeum 3 times without any side effects However her fasting readings are still relatively high despite increasing her evening Levemir by 5 units She is compliant with her mealtime dose in postprandial readings are relatively good except she is not doing many readings after supper       Oral hypoglycemic drugs the patient is taking are: None   Side effects from medications have been: Metformin: rash INSULIN regimen is described as: Novolog acl, acs 10; Levemir 30-35 twice a day  Glucose monitoring:  done 1-3 time a day          Glucometer:  One Touch.      Blood Glucose readings from home glucose meter download:  PREMEAL Breakfast Lunch Dinner Bedtime Overall  Glucose range:  122-220   83-228      Mean/median:  144   128    138   POST-MEAL PC Breakfast PC Lunch PC Dinner  Glucose range:   89-202  137, 183   Mean/median:   121     Hypoglycemia:   none     Glycemic control:   A1c was 10.5 in 01/2013  Lab Results  Component Value Date   HGBA1C 9.5* 07/06/2013   HGBA1C 6.5* 07/07/2011   HGBA1C 6.9* 07/08/2010   Lab Results  Component Value Date   LDLCALC 100* 07/09/2010   CREATININE 0.9 08/28/2013    Self-care: The diet that the patient has been following is: tries to limit fats     Meals: 3 meals per day. Recently cutting back on frequent meals and  portions; no hs snacks          Exercise: Walk a little        Dietician visit: Most recent: 2014 ( class).               Compliance with the medical regimen: Good  Retinal exam: Most recent:.9/14    Weight history:  Wt Readings from Last 3 Encounters:  08/31/13 223 lb (101.152  kg)  08/15/13 219 lb (99.338 kg)  08/02/13 218 lb 12.8 oz (99.247 kg)      Medication List       This list is accurate as of: 08/31/13  1:55 PM.  Always use your most recent med list.               Albiglutide 30 MG Pen  Commonly known as:  TANZEUM  Inject 30 mg into the skin once a week.     aliskiren 300 MG tablet  Commonly known as:  TEKTURNA  Take 300 mg by mouth daily.     Alpha-Lipoic Acid 300 MG Caps  Take 300 mg by mouth.     aspirin EC 81 MG tablet  Take 162 mg by mouth daily.     AZOR 10-40 MG per tablet  Generic drug:  amLODipine-olmesartan  Take 1 tablet by mouth daily.     carbamazepine 200 MG 12 hr capsule  Commonly known as:  CARBATROL  Take 200-400 mg by mouth 2 (two) times daily. Takes 1 capsule in the morning and 2 capsules at bedtime     donepezil 10 MG tablet  Commonly known as:  ARICEPT  Take 10 mg by mouth at bedtime.      esomeprazole 40 MG capsule  Commonly known as:  NEXIUM  Take 40 mg by mouth daily before breakfast. Acid reflux     folic acid 1 MG tablet  Commonly known as:  FOLVITE  Take 1 mg by mouth daily.     hydrOXYzine 25 MG tablet  Commonly known as:  ATARAX/VISTARIL  Take 25-50 mg by mouth every 6 (six) hours as needed for itching.     insulin aspart 100 UNIT/ML FlexPen  Commonly known as:  NOVOLOG FLEXPEN  Inject 10 units before breakfast, lunch and dinner every day.     Insulin Pen Needle 32G X 4 MM Misc  Use 8 pen needles per day     LATUDA 40 MG Tabs tablet  Generic drug:  lurasidone  Take 40 mg by mouth daily with breakfast.     LEVEMIR FLEXTOUCH [place]  Inject 30-35 Units into the skin 2 (two) times daily. Takes 30 units in the morning and 35 units at night     levothyroxine 75 MCG tablet  Commonly known as:  SYNTHROID, LEVOTHROID  Take 75 mcg by mouth daily.     meclizine 25 MG tablet  Commonly known as:  ANTIVERT  Take 1 tablet (25 mg total) by mouth 3 (three) times daily as needed.     methotrexate 2.5 MG tablet  Commonly known as:  RHEUMATREX  Take 7.5 mg by mouth once a week. Pt takes 2 tablets for 7.5mg  dose. Pt takes on Thursday of each week. Caution:Chemotherapy. Protect from light.     nitroGLYCERIN 0.4 MG SL tablet  Commonly known as:  NITROSTAT  Place 0.4 mg under the tongue every 5 (five) minutes as needed.     ondansetron 8 MG tablet  Commonly known as:  ZOFRAN  Take 1 tablet (8 mg total) by mouth every 8 (eight) hours as needed for nausea.     TOVIAZ 8 MG Tb24 tablet  Generic drug:  fesoterodine  Take 8 mg by mouth daily.     VICTOZA 18 MG/3ML Sopn  Generic drug:  Liraglutide  INJECT 0.2 MLS  (1.2 MG) DAILY AS DIRECTED     Vitamin D 2000 UNITS tablet  Take 2,000 Units by mouth daily.  Allergies:  Allergies  Allergen Reactions  . Flagyl [Metronidazole Hcl] Itching and Rash  . Metformin And Related Rash  . Milk-Related Compounds   .  Peanuts [Peanut Oil]   . Ciprofloxacin Itching and Rash    Past Medical History  Diagnosis Date  . Thyroid disease   . Hypertension   . Ischemic colitis, enteritis, or enterocolitis   . Sleep apnea   . Urinary bladder incontinence   . GERD (gastroesophageal reflux disease)   . Hyperlipidemia   . Diabetes mellitus     diet controlled  . Stroke     x's 2  . Depression     Bipolar    Past Surgical History  Procedure Laterality Date  . Retinal detachment surgery    . Appendectomy    . Tonsillectomy    . Hemorrhoid surgery    . Fracture surgery    . Cholecystectomy    . Abdominal hysterectomy      partial     Family History  Problem Relation Age of Onset  . Heart disease Mother   . Stroke Father   . Cancer Sister   . Cancer Brother     Social History:  reports that she has never smoked. She has never used smokeless tobacco. She reports that she does not drink alcohol or use illicit drugs.    Review of Systems   She  thinks that her complaints of marked malaise and weakness were related to her depression rather than any organic etiology Has done better since her visit with psychiatrist      Lipids: Last LDL from PCP is 88 in 1/15       Lab Results  Component Value Date   CHOL 187 07/09/2010   HDL 64 07/09/2010   LDLCALC 100* 07/09/2010   TRIG 113 07/09/2010   CHOLHDL 2.9 07/09/2010       Thyroid:  Gets fatigue. Has had presumed hypothyroidism for over 20 years, has not had a change in medications in several years and last TSH was 1.9 from PCP     The blood pressure has been controlled with amlodipine/Benicar and now not taking diltiazem which was previously was added for high blood pressure by PCP. Still taking Tekturna without the HCTZ because she had mild hyponatremia Blood pressure is still not well controlled Not checking at home  Physical Examination:  BP 158/83  Pulse 95  Temp(Src) 97.8 F (36.6 C)  Resp 16  Ht 5\' 3"  (1.6 m)  Wt 223 lb (101.152  kg)  BMI 39.51 kg/m2  SpO2 95%  No pedal edema    ASSESSMENT:  Diabetes type 2, uncontrolled   Her blood sugars have  improved again with adding a GLP-1 drug  She is not using Tanzeum into the Victoza but apparently her weakness was not related to Victoza She tends to have a fairly good postprandial readings most of the time but highest readings are fasting or before supper indicating need for greater amount of basal insulin However Victoza is  more likely to be effective in her overall glycemic control compared to Tanzeum and should be able to lose more weight with this also Will have her titrate to 1.2 mg would consider using 1.8 mg subsequently if needed also, again discussed possible GI side effects Discussed blood sugar targets and the need for increased physical activity  Hypertension: Blood pressure appears to be higher with stopping HCTZ and diltiazem and will defer management to PCP She does have relatively fast  pulse rate also and may benefit from Bystolic or other beta blocker   PLAN:   Restart Victoza and titrate gradually as directed   More readings after dinner  Increase supper dose and reduce lunchtime mealtime coverage   Followup with PCP for other issues including further evaluation of her continued nonspecific weakness  Followup A1c on the next visit  Patient Instructions  Levemir same, call if am sugar stays > 140  Novolog 10 in am, 7-8 at lunch and 12 at supper  Victoza 0.6mg  1x daily for 3 days then 1.2 mg daily    Counseling time over 50% of today's 25 minute visit   Nuha Degner 08/31/2013, 1:55 PM   Note: This office note was prepared with Insurance underwriterDragon voice recognition system technology. Any transcriptional errors that result from this process are unintentional.   Addendum: Labs show normal fructosamine  Appointment on 08/28/2013  Component Date Value Ref Range Status  . Fructosamine 08/28/2013 260  190 - 270 umol/L Final  . TSH 08/28/2013 0.51   0.35 - 4.50 uIU/mL Final  . Free T4 08/28/2013 0.91  0.60 - 1.60 ng/dL Final  . Sodium 16/10/960408/11/2013 139  135 - 145 mEq/L Final  . Potassium 08/28/2013 5.0  3.5 - 5.1 mEq/L Final  . Chloride 08/28/2013 100  96 - 112 mEq/L Final  . CO2 08/28/2013 24  19 - 32 mEq/L Final  . Glucose, Bld 08/28/2013 135* 70 - 99 mg/dL Final  . BUN 54/09/811908/11/2013 13  6 - 23 mg/dL Final  . Creatinine, Ser 08/28/2013 0.9  0.4 - 1.2 mg/dL Final  . Total Bilirubin 08/28/2013 0.3  0.2 - 1.2 mg/dL Final  . Alkaline Phosphatase 08/28/2013 109  39 - 117 U/L Final  . AST 08/28/2013 27  0 - 37 U/L Final  . ALT 08/28/2013 29  0 - 35 U/L Final  . Total Protein 08/28/2013 7.3  6.0 - 8.3 g/dL Final  . Albumin 14/78/295608/11/2013 4.0  3.5 - 5.2 g/dL Final  . Calcium 21/30/865708/11/2013 9.4  8.4 - 10.5 mg/dL Final  . GFR 84/69/629508/11/2013 69.81  >60.00 mL/min Final

## 2013-09-02 DIAGNOSIS — E063 Autoimmune thyroiditis: Secondary | ICD-10-CM | POA: Insufficient documentation

## 2013-09-04 ENCOUNTER — Other Ambulatory Visit: Payer: Self-pay | Admitting: Endocrinology

## 2013-09-07 ENCOUNTER — Other Ambulatory Visit: Payer: Self-pay | Admitting: Endocrinology

## 2013-09-10 ENCOUNTER — Other Ambulatory Visit: Payer: Self-pay | Admitting: Gastroenterology

## 2013-09-10 ENCOUNTER — Ambulatory Visit
Admission: RE | Admit: 2013-09-10 | Discharge: 2013-09-10 | Disposition: A | Discharge status: Home / Self Care | Payer: Medicare Other | Source: Ambulatory Visit | Attending: Gastroenterology | Admitting: Gastroenterology

## 2013-09-10 DIAGNOSIS — R1084 Generalized abdominal pain: Secondary | ICD-10-CM

## 2013-09-10 DIAGNOSIS — R112 Nausea with vomiting, unspecified: Secondary | ICD-10-CM

## 2013-09-10 MED ORDER — IOHEXOL 300 MG/ML  SOLN
30.0000 mL | Freq: Once | INTRAMUSCULAR | Status: AC | PRN
  Administered 2013-09-10: 30 mL via ORAL

## 2013-09-10 MED ORDER — IOHEXOL 300 MG/ML  SOLN
125.0000 mL | Freq: Once | INTRAMUSCULAR | Status: AC | PRN
  Administered 2013-09-10: 125 mL via INTRAVENOUS

## 2013-09-26 ENCOUNTER — Other Ambulatory Visit (HOSPITAL_COMMUNITY): Payer: Self-pay | Admitting: Internal Medicine

## 2013-09-26 DIAGNOSIS — I10 Essential (primary) hypertension: Secondary | ICD-10-CM

## 2013-09-27 ENCOUNTER — Ambulatory Visit (HOSPITAL_COMMUNITY): Payer: Medicare Other | Attending: Cardiovascular Disease

## 2013-09-27 DIAGNOSIS — E039 Hypothyroidism, unspecified: Secondary | ICD-10-CM | POA: Diagnosis not present

## 2013-09-27 DIAGNOSIS — I119 Hypertensive heart disease without heart failure: Secondary | ICD-10-CM | POA: Diagnosis present

## 2013-09-27 DIAGNOSIS — I079 Rheumatic tricuspid valve disease, unspecified: Secondary | ICD-10-CM | POA: Insufficient documentation

## 2013-09-27 DIAGNOSIS — I059 Rheumatic mitral valve disease, unspecified: Secondary | ICD-10-CM | POA: Diagnosis not present

## 2013-09-27 DIAGNOSIS — E119 Type 2 diabetes mellitus without complications: Secondary | ICD-10-CM | POA: Diagnosis not present

## 2013-09-27 DIAGNOSIS — I10 Essential (primary) hypertension: Secondary | ICD-10-CM

## 2013-09-27 DIAGNOSIS — E785 Hyperlipidemia, unspecified: Secondary | ICD-10-CM | POA: Diagnosis not present

## 2013-09-27 NOTE — Progress Notes (Signed)
2D Echo completed. 09/27/2013 

## 2013-10-02 ENCOUNTER — Other Ambulatory Visit: Payer: Self-pay | Admitting: Internal Medicine

## 2013-10-02 DIAGNOSIS — I10 Essential (primary) hypertension: Secondary | ICD-10-CM

## 2013-10-04 ENCOUNTER — Other Ambulatory Visit: Payer: Self-pay | Admitting: Gastroenterology

## 2013-10-05 ENCOUNTER — Telehealth: Payer: Self-pay | Admitting: Cardiovascular Disease

## 2013-10-05 NOTE — Telephone Encounter (Signed)
Closed encounter °

## 2013-10-08 ENCOUNTER — Ambulatory Visit (INDEPENDENT_AMBULATORY_CARE_PROVIDER_SITE_OTHER): Payer: Medicare Other | Admitting: Ophthalmology

## 2013-10-11 ENCOUNTER — Ambulatory Visit
Admission: RE | Admit: 2013-10-11 | Discharge: 2013-10-11 | Disposition: A | Discharge status: Home / Self Care | Payer: Medicare Other | Source: Ambulatory Visit | Attending: Internal Medicine | Admitting: Internal Medicine

## 2013-10-11 DIAGNOSIS — I10 Essential (primary) hypertension: Secondary | ICD-10-CM

## 2013-10-29 ENCOUNTER — Ambulatory Visit (INDEPENDENT_AMBULATORY_CARE_PROVIDER_SITE_OTHER): Payer: Medicare Other | Admitting: Cardiovascular Disease

## 2013-10-29 ENCOUNTER — Encounter: Payer: Self-pay | Admitting: Cardiovascular Disease

## 2013-10-29 VITALS — BP 138/80 | HR 91 | Resp 16 | Ht 63.0 in | Wt 225.9 lb

## 2013-10-29 DIAGNOSIS — R079 Chest pain, unspecified: Secondary | ICD-10-CM

## 2013-10-29 DIAGNOSIS — R0600 Dyspnea, unspecified: Secondary | ICD-10-CM

## 2013-10-29 DIAGNOSIS — I10 Essential (primary) hypertension: Secondary | ICD-10-CM

## 2013-10-29 DIAGNOSIS — R0609 Other forms of dyspnea: Secondary | ICD-10-CM

## 2013-10-29 DIAGNOSIS — E785 Hyperlipidemia, unspecified: Secondary | ICD-10-CM

## 2013-10-29 DIAGNOSIS — R0602 Shortness of breath: Secondary | ICD-10-CM

## 2013-10-29 NOTE — Patient Instructions (Signed)
Your physician has requested that you have a lexiscan myoview. For further information please visit [email or web address]. Please follow instruction sheet, as given.  Dr. Royann Shiversroitoru recommends that you schedule a follow-up appointment in: 1 month for test results.

## 2013-10-30 ENCOUNTER — Encounter: Payer: Self-pay | Admitting: Cardiovascular Disease

## 2013-10-30 DIAGNOSIS — R0600 Dyspnea, unspecified: Secondary | ICD-10-CM | POA: Insufficient documentation

## 2013-10-30 DIAGNOSIS — R079 Chest pain, unspecified: Secondary | ICD-10-CM | POA: Insufficient documentation

## 2013-10-30 DIAGNOSIS — E785 Hyperlipidemia, unspecified: Secondary | ICD-10-CM | POA: Insufficient documentation

## 2013-10-30 DIAGNOSIS — R0609 Other forms of dyspnea: Secondary | ICD-10-CM | POA: Insufficient documentation

## 2013-10-30 NOTE — Progress Notes (Signed)
Patient ID: Mary BignessShirley H [name], female   DOB: 06/02/1940, 73 y.o.   MRN: [id number]      Reason for office visit Shortness of breath and chest pain  [name] is a 73 year old woman with a history of hypertension, diet-controlled type 2 diabetes mellitus and hyperlipidemia, morbid obesity, previous transient ischemic attacks and a history of ischemic colitis who presents in consultation for shortness of breath and right-sided chest pain.  She does not have known structural heart disease. She recently underwent an echocardiogram showing normal left ventricular systolic function and regional wall motion with mild diastolic dysfunction. She does not have evidence of renal artery stenosis by ultrasound. Her electrocardiogram shows normal sinus rhythm and chronic left anterior fascicular block without ischemic appearing repolarization changes.  She has recurrent episodes of chest discomfort located immediately to the right of the sternum. There is an inconsistent pattern of association with physical activity. Occasionally the episode of chest discomfort lasted for almost an hour. Her major complaint is worsening shortness of breath. She clearly describes functional class III dyspnea (getting dressed, taking a bath, etc.). She has not had problems with lower extremity edema.  She has obstructive sleep apnea and uses CPAP. She has "stomach emptying problems" (I assume diabetic gastroparesis) and has poorly controlled diabetes with a hemoglobin A1c in excess of 10%.  Today her blood pressure is pretty good at 138/80 but when I rechecked it was 158/84. At home her typical blood pressure is 160-190/90-110.   Allergies  Allergen Reactions  . Flagyl [Metronidazole Hcl] Itching and Rash  . Metformin And Related Rash  . Milk-Related Compounds   . Peanuts [Peanut Oil]     [place]RAZIL NUTS ONLY  . Ciprofloxacin Itching and Rash    Current Outpatient Prescriptions  Medication Sig Dispense Refill  .  aliskiren (TEKTURNA) 300 MG tablet Take 300 mg by mouth daily.      . Alpha-Lipoic Acid 300 MG CAPS Take 300 mg by mouth.      Marland Kitchen. amLODipine-olmesartan (AZOR) 10-40 MG per tablet Take 1 tablet by mouth daily.        Marland Kitchen. aspirin EC 81 MG tablet Take 162 mg by mouth daily.       . bisoprolol (ZEBETA) 5 MG tablet Take 5 mg by mouth daily.      . carbamazepine (CARBATROL) 200 MG 12 hr capsule Take 200-400 mg by mouth 2 (two) times daily. Takes 1 capsule in the morning and 2 capsules at bedtime      . Cholecalciferol (VITAMIN D) 2000 UNITS tablet Take 2,000 Units by mouth daily.      Marland Kitchen. dexlansoprazole (DEXILANT) 60 MG capsule Take 60 mg by mouth daily.      Marland Kitchen. donepezil (ARICEPT) 10 MG tablet Take 10 mg by mouth at bedtime.      . fesoterodine (TOVIAZ) 8 MG TB24 Take 8 mg by mouth daily.      . folic acid (FOLVITE) 1 MG tablet Take 1 mg by mouth daily.      . hydrALAZINE (APRESOLINE) 25 MG tablet Take 25 mg by mouth 2 (two) times daily.      . hydrOXYzine (ATARAX/VISTARIL) 50 MG tablet Take 50 mg by mouth 3 (three) times daily as needed.      . Insulin Detemir (LEVEMIR FLEXTOUCH [place]) Inject 30-35 Units into the skin 2 (two) times daily. Takes 30 units in the morning and 35 units at night      . Insulin Pen Needle 32G X 4 MM  MISC Use 8 pen needles per day  250 each  2  . levothyroxine (SYNTHROID, LEVOTHROID) 75 MCG tablet Take 75 mcg by mouth daily.        Marland Kitchen. lurasidone (LATUDA) 40 MG TABS tablet Take 40 mg by mouth daily with breakfast.      . meclizine (ANTIVERT) 25 MG tablet Take 1 tablet (25 mg total) by mouth 3 (three) times daily as needed.  30 tablet  0  . methotrexate (RHEUMATREX) 2.5 MG tablet Take 7.5 mg by mouth once a week. Pt takes 2 tablets for 7.5mg  dose. Pt takes on Thursday of each week. Caution:Chemotherapy. Protect from light.      . nitroGLYCERIN (NITROSTAT) 0.4 MG SL tablet Place 0.4 mg under the tongue every 5 (five) minutes as needed.      Marland Kitchen. NOVOLOG FLEXPEN 100 UNIT/ML FlexPen INJECT  10 UNITS BEFORE BREAKFAST, LUNCH AND DINNER EVERY DAY.  15 mL  2  . OLANZapine (ZYPREXA) 5 MG tablet Take 5 mg by mouth at bedtime.      . ondansetron (ZOFRAN) 8 MG tablet Take 1 tablet (8 mg total) by mouth every 8 (eight) hours as needed for nausea.  9 tablet  0  . ranitidine (ZANTAC) 150 MG capsule Take 150 mg by mouth 2 (two) times daily.      Marland Kitchen. VICTOZA 18 MG/3ML SOPN INJECT 0.2 MLS  (1.2 MG) DAILY AS DIRECTED  6 mL  0   No current facility-administered medications for this visit.    Past Medical History  Diagnosis Date  . Thyroid disease   . Hypertension   . Ischemic colitis, enteritis, or enterocolitis   . Sleep apnea   . Urinary bladder incontinence   . GERD (gastroesophageal reflux disease)   . Hyperlipidemia   . Diabetes mellitus     diet controlled  . Stroke     x's 2  . Depression     Bipolar    Past Surgical History  Procedure Laterality Date  . Retinal detachment surgery    . Appendectomy    . Tonsillectomy    . Hemorrhoid surgery    . Fracture surgery    . Cholecystectomy    . Abdominal hysterectomy      partial     Family History  Problem Relation Age of Onset  . Heart disease Mother   . Stroke Father   . Cancer Sister   . Cancer Brother     History   Social History  . Marital Status: Widowed    Spouse Name: N/A    Number of Children: N/A  . Years of Education: N/A   Occupational History  . Not on file.   Social History Main Topics  . Smoking status: Never Smoker   . Smokeless tobacco: Never Used  . Alcohol Use: No  . Drug Use: No  . Sexual Activity:    Other Topics Concern  . Not on file   Social History Narrative  . No narrative on file    Review of systems: The patient specifically denies dyspnea at rest, orthopnea, paroxysmal nocturnal dyspnea, syncope, palpitations, focal neurological deficits, intermittent claudication, lower extremity edema, unexplained weight gain, cough, hemoptysis or wheezing.  The patient also denies  abdominal pain, nausea, vomiting, dysphagia, diarrhea, constipation, polyuria, polydipsia, dysuria, hematuria, frequency, urgency, abnormal bleeding or bruising, fever, chills, unexpected weight changes, mood swings, change in skin or hair texture, change in voice quality, auditory or visual problems, allergic reactions or rashes, new musculoskeletal complaints  other than usual "aches and pains".   PHYSICAL EXAM BP 138/80  Pulse 91  Resp 16  Ht 5\' 3"  (1.6 m)  Wt 102.468 kg (225 lb 14.4 oz)  BMI 40.03 kg/m2 Morbid obesity limits the physical exam.  General: Alert, oriented x3, no distress Head: no evidence of trauma, PERRL, EOMI, no exophtalmos or lid lag, no myxedema, no xanthelasma; normal ears, nose and oropharynx Neck: normal jugular venous pulsations and no hepatojugular reflux; brisk carotid pulses without delay and no carotid bruits Chest: clear to auscultation, no signs of consolidation by percussion or palpation, normal fremitus, symmetrical and full respiratory excursions Cardiovascular: normal position and quality of the apical impulse, regular rhythm, normal first and second heart sounds, grade 1/6 holosystolic murmur, heard especially at the left lower sternal border but throughout the precordium, no rubs or gallops Abdomen: no tenderness or distention, no masses by palpation, no abnormal pulsatility or arterial bruits, normal bowel sounds, no hepatosplenomegaly Extremities: no clubbing, cyanosis or edema; 2+ radial, ulnar and brachial pulses bilaterally; 2+ right femoral, posterior tibial and dorsalis pedis pulses; 2+ left femoral, posterior tibial and dorsalis pedis pulses; no subclavian or femoral bruits Neurological: grossly nonfocal   EKG: Normal sinus rhythm, left ventricular fascicular block, no acute repolarization abnormalities  Lipid Panel Dr. Ricki Miller January 2015: total cholesterol 195, triglycerides 163, HDL 74, calculated LDL of 88, hemoglobin A1c 40.9%     Component  Value Date/Time   CHOL 187 07/09/2010 0411   TRIG 113 07/09/2010 0411   HDL 64 07/09/2010 0411   CHOLHDL 2.9 07/09/2010 0411   VLDL 23 07/09/2010 0411   LDLCALC 100* 07/09/2010 0411    BMET    Component Value Date/Time   NA 139 08/28/2013 1000   K 5.0 08/28/2013 1000   CL 100 08/28/2013 1000   CO2 24 08/28/2013 1000   GLUCOSE 135* 08/28/2013 1000   BUN 13 08/28/2013 1000   CREATININE 0.9 08/28/2013 1000   CALCIUM 9.4 08/28/2013 1000   GFRNONAA 88* 05/08/2012 1344   GFRAA >90 05/08/2012 1344     ASSESSMENT AND PLAN  While there may be other explanations for her symptoms, I am concerned that [name] exertional dyspnea and recurrent episodes of chest discomfort may represent coronary insufficiency. She has numerous coronary risk factors, some of which are not well controlled (diabetes mellitus and blood pressure).  I recommended that she have a myocardial perfusion study. She is unable to walk on the treadmill due to obesity, weakness, deconditioning and exertional dyspnea. We'll schedule her for a vasodilator study. Unfortunately her body habitus increases the likelihood of "false positive" artifactual abnormalities, but coronary angiography would also not be a risk-free undertaking in this morbidly obese diabetic even if she has normal renal function.   We got some complicating readings on her blood pressure today, but will have the opportunity to check her blood pressure repeatedly when she comes in for her stress test. It sounds like she will require further adjustment of her antihypertensives. I am a little concerned about using a combination of Tekturna and Olmesartan in a patient with diabetes. She is already also taking a beta blocker, hydralazine and amlodipine. There is definite room to increase both the beta blocker and the hydralazine. Also consider switching to carvedilol for enhanced antihypertensive affect the alpha blocking effect.  We'll discuss adjustments in her  antihypertensives as she comes back for her followup after the stress test.  Patient Instructions  Your physician has requested that you have a  lexiscan myoview. For further information please visit [email or web address] Please follow instruction sheet, as given.  Dr. Royann Shivers recommends that you schedule a follow-up appointment in: 1 month for test results.       Orders Placed This Encounter  Procedures  . Myocardial Perfusion Imaging  . EKG 12-Lead   Meds ordered this encounter  Medications  . hydrOXYzine (ATARAX/VISTARIL) 50 MG tablet    Sig: Take 50 mg by mouth 3 (three) times daily as needed.  Marland Kitchen dexlansoprazole (DEXILANT) 60 MG capsule    Sig: Take 60 mg by mouth daily.  . hydrALAZINE (APRESOLINE) 25 MG tablet    Sig: Take 25 mg by mouth 2 (two) times daily.  . bisoprolol (ZEBETA) 5 MG tablet    Sig: Take 5 mg by mouth daily.  Marland Kitchen OLANZapine (ZYPREXA) 5 MG tablet    Sig: Take 5 mg by mouth at bedtime.  . ranitidine (ZANTAC) 150 MG capsule    Sig: Take 150 mg by mouth 2 (two) times daily.    Junious Silk, MD, [facility] [phone] office [phone] pager

## 2013-10-31 ENCOUNTER — Encounter: Payer: Self-pay | Admitting: Endocrinology

## 2013-10-31 ENCOUNTER — Ambulatory Visit (INDEPENDENT_AMBULATORY_CARE_PROVIDER_SITE_OTHER): Payer: Medicare Other | Admitting: Endocrinology

## 2013-10-31 VITALS — BP 132/82 | HR 88 | Temp 97.8°F | Resp 16 | Ht 63.0 in | Wt 226.0 lb

## 2013-10-31 DIAGNOSIS — E038 Other specified hypothyroidism: Secondary | ICD-10-CM

## 2013-10-31 DIAGNOSIS — IMO0002 Reserved for concepts with insufficient information to code with codable children: Secondary | ICD-10-CM

## 2013-10-31 DIAGNOSIS — E1165 Type 2 diabetes mellitus with hyperglycemia: Secondary | ICD-10-CM

## 2013-10-31 DIAGNOSIS — E785 Hyperlipidemia, unspecified: Secondary | ICD-10-CM

## 2013-10-31 DIAGNOSIS — E1142 Type 2 diabetes mellitus with diabetic polyneuropathy: Secondary | ICD-10-CM

## 2013-10-31 DIAGNOSIS — E063 Autoimmune thyroiditis: Secondary | ICD-10-CM

## 2013-10-31 LAB — BASIC METABOLIC PANEL
BUN: 12 mg/dL (ref 6–23)
CALCIUM: 8.9 mg/dL (ref 8.4–10.5)
CHLORIDE: 95 meq/L — AB (ref 96–112)
CO2: 27 meq/L (ref 19–32)
Creatinine, Ser: 0.9 mg/dL (ref 0.4–1.2)
GFR: 62.13 mL/min (ref >60.00)
Glucose, Bld: 160 mg/dL — ABNORMAL HIGH (ref 70–99)
Potassium: 4.5 mEq/L (ref 3.5–5.1)
Sodium: 129 mEq/L — ABNORMAL LOW (ref 135–145)

## 2013-10-31 LAB — HEMOGLOBIN A1C: HEMOGLOBIN A1C: 7.1 % — AB (ref 4.6–6.5)

## 2013-10-31 NOTE — Progress Notes (Signed)
Patient ID: Clayborn BignessShirley H Agner, female   DOB: 08-18-1940, 73 y.o.   MRN: [id number]    Reason for Appointment: Followup for Type 2 Diabetes  History of Present Illness:          Diagnosis: Type 2 diabetes mellitus, date of diagnosis:2011       Past history: Since she was apparently intolerant to metformin she was treated with Onglyza until about 2014  At that time because of increasing A1c of about 8% she was started on Levemir 15 units and this was aggressively increased Onglyza was continued She has not tried any other treatments for diabetes Her A1c has been 10-10.5 in 2015 In 3/15 she was told to start small doses of NovoLog at lunch and supper and add 15 units of Levemir at night also To control  hyperglycemia with her basal bolus insulin regimen she was given Victoza in addition on her initial consultation in 5/15  Recent history:  She is back on her Victoza since her last visit in 8/15 and she has gone up to 1.2 mg as directed Her blood sugars are very well controlled overall although still having some fluctuation Surprisingly has not lost any weight with switching from Tanzeum to Victoza Does not have any side effects like weakness or nausea Current blood sugar patterns and problems:  Fasting blood sugars are overall relatively high, averaging about 148 but somewhat   inconsistent and occasionally over 200  She does not check her blood sugars after dinner and not clear if she is having high readings at that time   Blood sugars are trending lower towards the afternoon but has had only one low blood sugar after lunch last month  Blood sugars before supper time or minimally increased but variable   She did not understand the need for adjusting her mealtime insulin based on what she is eating and is taking consistently 10 units before lunch and dinner even though her lunches larger  She will occasionally skip the mealtime dose if the blood sugar is below 100  Hypoglycemia  documented only once in the afternoon and blood sugars were relatively lower that morning and lunchtime       Oral hypoglycemic drugs the patient is taking are: None   Side effects from medications have been: Metformin: rash INSULIN regimen is described as: Novolog acl, acs 10; Levemir 30 am and 35 pm  Glucose monitoring:  done 1-3 time a day         Glucometer:  One Touch.      Blood Glucose readings from home glucose meter download:  PREMEAL Breakfast Lunch  afternoon  Bedtime Overall  Glucose range:  83-253   74-153   58-232     Mean/median:  145    150   138       Glycemic control:   A1c was 10.5 in 01/2013  Lab Results  Component Value Date   HGBA1C 9.5* 07/06/2013   HGBA1C 6.5* 07/07/2011   HGBA1C 6.9* 07/08/2010   Lab Results  Component Value Date   LDLCALC 100* 07/09/2010   CREATININE 0.9 08/28/2013    Self-care: The diet that the patient has been following is: tries to limit fats     Meals: 3 meals per day. Recently cutting back on frequent meals and  portions; no hs snacks          Exercise: Walk a little        Dietician visit: Most recent: 2014 ( class).  Compliance with the medical regimen: Good  Retinal exam: Most recent:.9/14    Weight history:  Wt Readings from Last 3 Encounters:  10/31/13 226 lb (102.513 kg)  10/29/13 225 lb 14.4 oz (102.468 kg)  08/31/13 223 lb (101.152 kg)      Medication List       This list is accurate as of: 10/31/13  1:24 PM.  Always use your most recent med list.               aliskiren 300 MG tablet  Commonly known as:  TEKTURNA  Take 300 mg by mouth daily.     Alpha-Lipoic Acid 300 MG Caps  Take 300 mg by mouth.     aspirin EC 81 MG tablet  Take 162 mg by mouth daily.     AZOR 10-40 MG per tablet  Generic drug:  amLODipine-olmesartan  Take 1 tablet by mouth daily.     bisoprolol 5 MG tablet  Commonly known as:  ZEBETA  Take 5 mg by mouth daily.     carbamazepine 200 MG 12 hr capsule  Commonly  known as:  CARBATROL  Take 200-400 mg by mouth 2 (two) times daily. Takes 1 capsule in the morning and 2 capsules at bedtime     DEXILANT 60 MG capsule  Generic drug:  dexlansoprazole  Take 60 mg by mouth daily.     donepezil 10 MG tablet  Commonly known as:  ARICEPT  Take 10 mg by mouth at bedtime.     folic acid 1 MG tablet  Commonly known as:  FOLVITE  Take 1 mg by mouth daily.     hydrALAZINE 25 MG tablet  Commonly known as:  APRESOLINE  Take 25 mg by mouth 2 (two) times daily.     hydrOXYzine 50 MG tablet  Commonly known as:  ATARAX/VISTARIL  Take 50 mg by mouth 3 (three) times daily as needed.     Insulin Pen Needle 32G X 4 MM Misc  Use 8 pen needles per day     LATUDA 40 MG Tabs tablet  Generic drug:  lurasidone  Take 40 mg by mouth daily with breakfast.     LEVEMIR FLEXTOUCH [place]  Inject 30-35 Units into the skin 2 (two) times daily. Takes 30 units in the morning and 35 units at night     levothyroxine 75 MCG tablet  Commonly known as:  SYNTHROID, LEVOTHROID  Take 75 mcg by mouth daily.     meclizine 25 MG tablet  Commonly known as:  ANTIVERT  Take 1 tablet (25 mg total) by mouth 3 (three) times daily as needed.     methotrexate 2.5 MG tablet  Commonly known as:  RHEUMATREX  Take 7.5 mg by mouth once a week. Pt takes 2 tablets for 7.5mg  dose. Pt takes on Thursday of each week. Caution:Chemotherapy. Protect from light.     nitroGLYCERIN 0.4 MG SL tablet  Commonly known as:  NITROSTAT  Place 0.4 mg under the tongue every 5 (five) minutes as needed.     NOVOLOG FLEXPEN 100 UNIT/ML FlexPen  Generic drug:  insulin aspart  INJECT 10 UNITS BEFORE BREAKFAST, LUNCH AND DINNER EVERY DAY.     OLANZapine 5 MG tablet  Commonly known as:  ZYPREXA  Take 5 mg by mouth at bedtime.     ondansetron 8 MG tablet  Commonly known as:  ZOFRAN  Take 1 tablet (8 mg total) by mouth every 8 (eight) hours as needed for nausea.  ranitidine 150 MG capsule  Commonly known as:   ZANTAC  Take 150 mg by mouth 2 (two) times daily.     TOVIAZ 8 MG Tb24 tablet  Generic drug:  fesoterodine  Take 8 mg by mouth daily.     VICTOZA 18 MG/3ML Sopn  Generic drug:  Liraglutide  INJECT 0.2 MLS  (1.2 MG) DAILY AS DIRECTED     Vitamin D 2000 UNITS tablet  Take 2,000 Units by mouth daily.        Allergies:  Allergies  Allergen Reactions  . Flagyl [Metronidazole Hcl] Itching and Rash  . Metformin And Related Rash  . Milk-Related Compounds   . Peanuts [Peanut Oil]     [place]RA[place]IL NUTS ONLY  . Ciprofloxacin Itching and Rash    Past Medical History  Diagnosis Date  . Thyroid disease   . Hypertension   . Ischemic colitis, enteritis, or enterocolitis   . Sleep apnea   . Urinary bladder incontinence   . GERD (gastroesophageal reflux disease)   . Hyperlipidemia   . Diabetes mellitus     diet controlled  . Stroke     x's 2  . Depression     Bipolar    Past Surgical History  Procedure Laterality Date  . Retinal detachment surgery    . Appendectomy    . Tonsillectomy    . Hemorrhoid surgery    . Fracture surgery    . Cholecystectomy    . Abdominal hysterectomy      partial     Family History  Problem Relation Age of Onset  . Heart disease Mother   . Stroke Father   . Cancer Sister   . Cancer Brother     Social History:  reports that she has never smoked. She has never used smokeless tobacco. She reports that she does not drink alcohol or use illicit drugs.    Review of Systems   She  thinks that her complaints of marked malaise and weakness were related to her depression rather than any organic etiology Has done better since her visit with psychiatrist  Get short of breath on exertion  Asking about numbness in the base of the left great toe      Lipids: Last LDL from PCP is 88 in 1/15       Lab Results  Component Value Date   CHOL 187 07/09/2010   HDL 64 07/09/2010   LDLCALC 100* 07/09/2010   TRIG 113 07/09/2010   CHOLHDL 2.9 07/09/2010        Thyroid:  Gets fatigue. Has had presumed hypothyroidism for over 20 years, has not had a change in medications in several years and last TSH was  2.3 on 10/19/13 from PCP      The blood pressure has been controlled with amlodipine/Benicar   Still taking Tekturna without the HCTZ because she had mild hyponatremia Blood pressure is improved  Not checking at home  Physical Examination:  BP 132/82  Pulse 88  Temp(Src) 97.8 F (36.6 C)  Resp 16  Ht 5\' 3"  (1.6 m)  Wt 226 lb (102.513 kg)  BMI 40.04 kg/m2  SpO2 94%  She has puffiness of her lower legs  Appears to have mild decrease in sensation in the plantar surfaces of the left foot especially at the base of her first toe    ASSESSMENT/PLAN:   Diabetes type 2, uncontrolled   Her blood sugars have  improved again with  continuing Victoza However she is showing  some fluctuation in blood sugars Discussed needing to adjust her mealtime coverage based on meal size and carbohydrate intake and probably needs larger dose at lunch which is her bigger meal She will also not skip her mealtime doses when blood sugar is below 100 Since her fasting readings are not consistently high will not increase her evening Levemir as yet  However she needs to check blood sugar readings after dinner to help adjust suppertime dose A1c to be checked today  Hypertension: Blood pressure is controlled  Edema: She will discuss this with PCP  Numbness in the toe: Likely to be from diabetic neuropathy  Patient Instructions  Try increasing walking frequency  Less sugars before meals; more 2 hours after meals including dinner  May adjust Novolog based on meal size and type take 10-12 at lunch and 8-10 at supper     Counseling time over 50% of today's 25 minute visit   Jerika Wales 10/31/2013, 1:23 PM   Note: This office note was prepared with Dragon voice recognition system technology. Any transcriptional errors that result from this process are  unintentional.

## 2013-10-31 NOTE — Patient Instructions (Signed)
Try increasing walking frequency  Less sugars before meals; more 2 hours after meals including dinner  May adjust Novolog based on meal size and type take 10-12 at lunch and 8-10 at supper

## 2013-11-09 ENCOUNTER — Telehealth (HOSPITAL_COMMUNITY): Payer: Self-pay

## 2013-11-09 NOTE — Telephone Encounter (Signed)
Encounter complete. 

## 2013-11-14 ENCOUNTER — Ambulatory Visit (HOSPITAL_COMMUNITY)
Admission: RE | Admit: 2013-11-14 | Discharge: 2013-11-14 | Disposition: A | Discharge status: Home / Self Care | Payer: Medicare Other | Source: Ambulatory Visit | Attending: Cardiovascular Disease | Admitting: Cardiovascular Disease

## 2013-11-14 DIAGNOSIS — E119 Type 2 diabetes mellitus without complications: Secondary | ICD-10-CM | POA: Insufficient documentation

## 2013-11-14 DIAGNOSIS — R42 Dizziness and giddiness: Secondary | ICD-10-CM | POA: Diagnosis not present

## 2013-11-14 DIAGNOSIS — I10 Essential (primary) hypertension: Secondary | ICD-10-CM | POA: Diagnosis not present

## 2013-11-14 DIAGNOSIS — Z8249 Family history of ischemic heart disease and other diseases of the circulatory system: Secondary | ICD-10-CM | POA: Diagnosis not present

## 2013-11-14 DIAGNOSIS — R9431 Abnormal electrocardiogram [ECG] [EKG]: Secondary | ICD-10-CM | POA: Diagnosis not present

## 2013-11-14 DIAGNOSIS — R5383 Other fatigue: Secondary | ICD-10-CM | POA: Diagnosis not present

## 2013-11-14 DIAGNOSIS — E785 Hyperlipidemia, unspecified: Secondary | ICD-10-CM | POA: Diagnosis not present

## 2013-11-14 DIAGNOSIS — R079 Chest pain, unspecified: Secondary | ICD-10-CM | POA: Insufficient documentation

## 2013-11-14 DIAGNOSIS — Z794 Long term (current) use of insulin: Secondary | ICD-10-CM | POA: Diagnosis not present

## 2013-11-14 DIAGNOSIS — R0602 Shortness of breath: Secondary | ICD-10-CM | POA: Diagnosis not present

## 2013-11-14 MED ORDER — AMINOPHYLLINE 25 MG/ML IV SOLN
125.0000 mg | Freq: Once | INTRAVENOUS | Status: AC
  Administered 2013-11-14: 125 mg via INTRAVENOUS

## 2013-11-14 MED ORDER — REGADENOSON 0.4 MG/5ML IV SOLN
0.4000 mg | Freq: Once | INTRAVENOUS | Status: AC
  Administered 2013-11-14: 0.4 mg via INTRAVENOUS

## 2013-11-14 MED ORDER — TECHNETIUM TC 99M SESTAMIBI GENERIC - CARDIOLITE
10.0000 | Freq: Once | INTRAVENOUS | Status: AC | PRN
  Administered 2013-11-14: 10 via INTRAVENOUS

## 2013-11-14 MED ORDER — TECHNETIUM TC 99M SESTAMIBI GENERIC - CARDIOLITE
30.0000 | Freq: Once | INTRAVENOUS | Status: AC | PRN
  Administered 2013-11-14: 30 via INTRAVENOUS

## 2013-11-14 NOTE — Procedures (Addendum)
[facility] [address] [place] [id number] [phone]  Cardiology Nuclear Med Study  Mary BignessShirley H [name] is a 73 y.o. female     MRN : [id number]     DOB: [birth year]  Procedure Date: 11/14/2013  Nuclear Med Background Indication for Stress Test:  Abnormal EKG History:  No prior cardiac or respiratory history reported;Hx of ischemic colitis;Last NUC MPI on 03/17/2010-nonischemic;EF=69% Cardiac Risk Factors: Family History - CAD, Hypertension, IDDM Type 2, Lipids, Obesity and TIA  Symptoms:  Chest Pain, Dizziness, DOE, Fatigue, Light-Headedness and SOB   Nuclear Pre-Procedure Caffeine/Decaff Intake:  9:00pm NPO After: 7:00am   IV Site: R Forearm  IV 0.9% NS with Angio Cath:  22g  Chest Size (in):  n/a IV Started by: Berdie OgrenAmanda Wease, RN  Height: 5\' 3"  (1.6 m)  Cup Size: B  BMI:  Body mass index is 39.87 kg/(m^2). Weight:  225 lb (102.059 kg)   Tech Comments:  n/a    Nuclear Med Study 1 or 2 day study: 1 day  Stress Test Type:  Lexiscan  Order Authorizing Provider:  Thurmon FairMihai Croitoru, MD   Resting Radionuclide: Technetium 7539m Sestamibi  Resting Radionuclide Dose: 10.8 mCi   Stress Radionuclide:  Technetium 2639m Sestamibi  Stress Radionuclide Dose: 31.2 mCi           Stress Protocol Rest HR: 76 Stress HR: 94  Rest BP: 154/83 Stress BP: 176/66  Exercise Time (min): n/a METS: n/a   Predicted Max HR: 148 bpm % Max HR: 63.51 bpm Rate Pressure Product: [id number]  Dose of Adenosine (mg):  n/a Dose of Lexiscan: 0.4 mg  Dose of Atropine (mg): n/a Dose of Dobutamine: n/a mcg/kg/min (at max HR)  Stress Test Technologist: Esperanza Sheetserry-Marie Martin, CCT Nuclear Technologist: Koren Shiverobin Moffitt, CNMT   Rest Procedure:  Myocardial perfusion imaging was performed at rest 45 minutes following the intravenous administration of Technetium 5039m Sestamibi. Stress Procedure:  The patient received IV Lexiscan 0.4 mg over 15-seconds.  Technetium 11039m  Sestamibi injected IV at 30-seconds.  Patient experienced SOB, Stomach Pain and Dizziness; 125 mg Aminophylline IV was administered.  There were no significant changes with Lexiscan.  Quantitative spect images were obtained after a 45 minute delay.  Transient Ischemic Dilatation (Normal <1.22):  1.08 QGS EDV:  62 ml QGS ESV:  21 ml LV Ejection Fraction: 67%       Rest ECG: NSR, LAD.  Stress ECG: No significant ST segment change suggestive of ischemia.  QPS Raw Data Images:  Acquisition technically good; normal left ventricular size. Stress Images:  Normal homogeneous uptake in all areas of the myocardium. Rest Images:  Normal homogeneous uptake in all areas of the myocardium. Subtraction (SDS):  No evidence of ischemia.  Impression Exercise Capacity:  Lexiscan with no exercise. BP Response:  Normal blood pressure response. Clinical Symptoms:  There is dyspnea. ECG Impression:  No significant ST segment change suggestive of ischemia. Comparison with Prior Nuclear Study: Compared to 03/21/10, no significant change  Overall Impression:  Normal stress nuclear study.  LV Wall Motion:  NL LV Function; NL Wall Motion   Mary MillersBrian [name] [place], MD  11/14/2013 12:47 PM

## 2013-11-15 ENCOUNTER — Ambulatory Visit (INDEPENDENT_AMBULATORY_CARE_PROVIDER_SITE_OTHER): Payer: Medicare Other | Admitting: Ophthalmology

## 2013-11-15 DIAGNOSIS — E11319 Type 2 diabetes mellitus with unspecified diabetic retinopathy without macular edema: Secondary | ICD-10-CM

## 2013-11-15 DIAGNOSIS — E11329 Type 2 diabetes mellitus with mild nonproliferative diabetic retinopathy without macular edema: Secondary | ICD-10-CM

## 2013-11-15 DIAGNOSIS — H338 Other retinal detachments: Secondary | ICD-10-CM

## 2013-11-15 DIAGNOSIS — H43813 Vitreous degeneration, bilateral: Secondary | ICD-10-CM

## 2013-11-15 DIAGNOSIS — I10 Essential (primary) hypertension: Secondary | ICD-10-CM

## 2013-11-15 DIAGNOSIS — H35033 Hypertensive retinopathy, bilateral: Secondary | ICD-10-CM

## 2013-11-15 LAB — HM DIABETES EYE EXAM

## 2013-11-21 ENCOUNTER — Ambulatory Visit (INDEPENDENT_AMBULATORY_CARE_PROVIDER_SITE_OTHER): Payer: Medicare Other | Admitting: Cardiovascular Disease

## 2013-11-21 ENCOUNTER — Encounter: Payer: Self-pay | Admitting: Cardiovascular Disease

## 2013-11-21 VITALS — BP 150/90 | HR 60 | Resp 16 | Ht 63.0 in | Wt 227.0 lb

## 2013-11-21 DIAGNOSIS — Z79899 Other long term (current) drug therapy: Secondary | ICD-10-CM

## 2013-11-21 DIAGNOSIS — R079 Chest pain, unspecified: Secondary | ICD-10-CM

## 2013-11-21 DIAGNOSIS — I10 Essential (primary) hypertension: Secondary | ICD-10-CM

## 2013-11-21 MED ORDER — OLMESARTAN-AMLODIPINE-HCTZ 40-10-12.5 MG PO TABS: 1.0000 | ORAL_TABLET | Freq: Every day | ORAL | Status: DC

## 2013-11-21 NOTE — Patient Instructions (Signed)
STOP AZOR.  START Tribenzor 40/10/12.5mg  daily.  Your physician recommends that you return for lab work in: One month.  Dr. Royann Shiversroitoru recommends that you schedule a follow-up appointment in: 6 months.

## 2013-11-22 ENCOUNTER — Encounter: Payer: Self-pay | Admitting: Cardiovascular Disease

## 2013-11-22 NOTE — Progress Notes (Signed)
Patient ID: Mary BignessShirley H [name], female   DOB: 09/15/1940, 73 y.o.   MRN: [id number]      Reason for office visit Shortness of breath and chest pain  [name] is a 73 year old woman with a history of hypertension, diet-controlled type 2 diabetes mellitus and hyperlipidemia, morbid obesity, OSA on CPAP, previous transient ischemic attacks and a history of ischemic colitis without  known structural heart disease. She recently underwent an echocardiogram showing normal left ventricular systolic function and regional wall motion with mild diastolic dysfunction. She does not have evidence of renal artery stenosis by ultrasound. Her electrocardiogram shows normal sinus rhythm and chronic left anterior fascicular block without ischemic appearing repolarization changes. A few days ago she had a normal nuclear stress test.  She is no longer troubled by atypical chest pain and denies dizziness. She has mild left ankle edema (chronic). Her BP is persistently high.   Allergies  Allergen Reactions  . Flagyl [Metronidazole Hcl] Itching and Rash  . Metformin And Related Rash  . Milk-Related Compounds   . Peanuts [Peanut Oil]     [place]RAZIL NUTS ONLY  . Ciprofloxacin Itching and Rash    Current Outpatient Prescriptions  Medication Sig Dispense Refill  . aliskiren (TEKTURNA) 300 MG tablet Take 300 mg by mouth daily.    Marland Kitchen. aspirin EC 81 MG tablet Take 162 mg by mouth daily.     . bisoprolol (ZEBETA) 5 MG tablet Take 10 mg by mouth 2 (two) times daily.     . carbamazepine (CARBATROL) 200 MG 12 hr capsule Take 200 mg by mouth 2 (two) times daily.     . Cholecalciferol (VITAMIN D) 2000 UNITS tablet Take 2,000 Units by mouth daily.    Marland Kitchen. dexlansoprazole (DEXILANT) 60 MG capsule Take 60 mg by mouth daily.    Marland Kitchen. donepezil (ARICEPT) 10 MG tablet Take 10 mg by mouth at bedtime.    . fesoterodine (TOVIAZ) 8 MG TB24 Take 8 mg by mouth daily.    . folic acid (FOLVITE) 1 MG tablet Take 1 mg by mouth daily.    .  hydrALAZINE (APRESOLINE) 25 MG tablet Take 25 mg by mouth 2 (two) times daily.    . hydrOXYzine (ATARAX/VISTARIL) 50 MG tablet Take 50 mg by mouth 3 (three) times daily as needed.    . Insulin Detemir (LEVEMIR FLEXTOUCH [place]) Inject 30-35 Units into the skin 2 (two) times daily. Takes 30 units in the morning and 35 units at night    . Insulin Pen Needle 32G X 4 MM MISC Use 8 pen needles per day 250 each 2  . levothyroxine (SYNTHROID, LEVOTHROID) 75 MCG tablet Take 75 mcg by mouth daily.      Marland Kitchen. lurasidone (LATUDA) 80 MG TABS tablet Take 160 mg by mouth daily with breakfast.    . meclizine (ANTIVERT) 25 MG tablet Take 1 tablet (25 mg total) by mouth 3 (three) times daily as needed. 30 tablet 0  . methotrexate (RHEUMATREX) 2.5 MG tablet Take 7.5 mg by mouth once a week. Pt takes 2 tablets for 7.5mg  dose. Pt takes on Thursday of each week. Caution:Chemotherapy. Protect from light.    . metoCLOPramide (REGLAN) 5 MG tablet Take 5 mg by mouth as needed for nausea.    . nitroGLYCERIN (NITROSTAT) 0.4 MG SL tablet Place 0.4 mg under the tongue every 5 (five) minutes as needed.    Marland Kitchen. NOVOLOG FLEXPEN 100 UNIT/ML FlexPen INJECT 10 UNITS BEFORE BREAKFAST, LUNCH AND DINNER EVERY DAY. 15 mL 2  .  OLANZapine (ZYPREXA) 5 MG tablet Take 5 mg by mouth at bedtime.    . ondansetron (ZOFRAN) 8 MG tablet Take 1 tablet (8 mg total) by mouth every 8 (eight) hours as needed for nausea. 9 tablet 0  . ranitidine (ZANTAC) 150 MG capsule Take 150 mg by mouth every evening.     Marland Kitchen VICTOZA 18 MG/3ML SOPN INJECT 0.2 MLS  (1.2 MG) DAILY AS DIRECTED 6 mL 0  . Olmesartan-Amlodipine-HCTZ 40-10-12.5 MG TABS Take 1 tablet by mouth daily. 30 tablet 6   No current facility-administered medications for this visit.    Past Medical History  Diagnosis Date  . Thyroid disease   . Hypertension   . Ischemic colitis, enteritis, or enterocolitis   . Sleep apnea   . Urinary bladder incontinence   . GERD (gastroesophageal reflux disease)   .  Hyperlipidemia   . Diabetes mellitus     diet controlled  . Stroke     x's 2  . Depression     Bipolar    Past Surgical History  Procedure Laterality Date  . Retinal detachment surgery    . Appendectomy    . Tonsillectomy    . Hemorrhoid surgery    . Fracture surgery    . Cholecystectomy    . Abdominal hysterectomy      partial     Family History  Problem Relation Age of Onset  . Heart disease Mother   . Stroke Father   . Cancer Sister   . Cancer Brother     History   Social History  . Marital Status: Widowed    Spouse Name: N/A    Number of Children: N/A  . Years of Education: N/A   Occupational History  . Not on file.   Social History Main Topics  . Smoking status: Never Smoker   . Smokeless tobacco: Never Used  . Alcohol Use: No  . Drug Use: No  . Sexual Activity: Not on file   Other Topics Concern  . Not on file   Social History Narrative    Review of systems: The patient specifically denies any chest pain at rest or with exertion, dyspnea at rest or with exertion, orthopnea, paroxysmal nocturnal dyspnea, syncope, palpitations, focal neurological deficits, intermittent claudication, lower extremity edema, unexplained weight gain, cough, hemoptysis or wheezing.  The patient also denies abdominal pain, nausea, vomiting, dysphagia, diarrhea, constipation, polyuria, polydipsia, dysuria, hematuria, frequency, urgency, abnormal bleeding or bruising, fever, chills, unexpected weight changes, mood swings, change in skin or hair texture, change in voice quality, auditory or visual problems, allergic reactions or rashes, new musculoskeletal complaints other than usual "aches and pains".   PHYSICAL EXAM BP 150/90 mmHg  Pulse 60  Resp 16  Ht 5\' 3"  (1.6 m)  Wt 227 lb (102.967 kg)  BMI 40.22 kg/m2  General: Alert, oriented x3, no distress Head: no evidence of trauma, PERRL, EOMI, no exophtalmos or lid lag, no myxedema, no xanthelasma; normal ears, nose and  oropharynx Neck: normal jugular venous pulsations and no hepatojugular reflux; brisk carotid pulses without delay and no carotid bruits Chest: clear to auscultation, no signs of consolidation by percussion or palpation, normal fremitus, symmetrical and full respiratory excursions Cardiovascular: normal position and quality of the apical impulse, regular rhythm, normal first and second heart sounds, no murmurs, rubs or gallops Abdomen: no tenderness or distention, no masses by palpation, no abnormal pulsatility or arterial bruits, normal bowel sounds, no hepatosplenomegaly Extremities: no clubbing, cyanosis or edema; 2+ radial,  ulnar and brachial pulses bilaterally; 2+ right femoral, posterior tibial and dorsalis pedis pulses; 2+ left femoral, posterior tibial and dorsalis pedis pulses; no subclavian or femoral bruits Neurological: grossly nonfocal    Lipid Panel     Component Value Date/Time   CHOL 187 07/09/2010 0411   TRIG 113 07/09/2010 0411   HDL 64 07/09/2010 0411   CHOLHDL 2.9 07/09/2010 0411   VLDL 23 07/09/2010 0411   LDLCALC 100* 07/09/2010 0411    BMET    Component Value Date/Time   NA 129* 10/31/2013 1110   K 4.5 10/31/2013 1110   CL 95* 10/31/2013 1110   CO2 27 10/31/2013 1110   GLUCOSE 160* 10/31/2013 1110   BUN 12 10/31/2013 1110   CREATININE 0.9 10/31/2013 1110   CALCIUM 8.9 10/31/2013 1110   GFRNONAA 88* 05/08/2012 1344   GFRAA >90 05/08/2012 1344     ASSESSMENT AND PLAN  Atypical chest pain, resolved. Low risk noninvasive studies.  Will change from Azor to tribenzor  (essentially adding HCTZ 12.5 mg daily) and reevaluate BP after a few weeks, recheck K and creatinine.  Weight loss, sodium restriction reviewed.  Patient Instructions  STOP AZOR.  START Tribenzor 40/10/12.5mg  daily.  Your physician recommends that you return for lab work in: One month.  Dr. Royann Shiversroitoru recommends that you schedule a follow-up appointment in: 6  months.        Orders Placed This Encounter  Procedures  . Basic metabolic panel   Meds ordered this encounter  Medications  . lurasidone (LATUDA) 80 MG TABS tablet    Sig: Take 160 mg by mouth daily with breakfast.  . metoCLOPramide (REGLAN) 5 MG tablet    Sig: Take 5 mg by mouth as needed for nausea.  . Olmesartan-Amlodipine-HCTZ 40-10-12.5 MG TABS    Sig: Take 1 tablet by mouth daily.    Dispense:  30 tablet    Refill:  6    Aracelis Ulrey  Thurmon FairMihai Lynisha Osuch, MD, [facility]ACC [facility] [phone] office [phone] pager

## 2013-11-26 ENCOUNTER — Telehealth: Payer: Self-pay | Admitting: Cardiovascular Disease

## 2013-11-26 MED ORDER — HYDRALAZINE HCL 25 MG PO TABS: 25.0000 mg | ORAL_TABLET | Freq: Three times a day (TID) | ORAL | Status: DC

## 2013-11-26 NOTE — Telephone Encounter (Signed)
Pt called in stating that the pharmacy informed her that there is a drug interaction between her Aliskiren and Olmesartan-Amlodipine . Please call  Thanks

## 2013-11-26 NOTE — Telephone Encounter (Signed)
I have reviewed the drug information on Tekturna and Tribenzor.  Per guidelines on Tekturna this medication is not recommended to be taken with an ACE/ARB.  The pt was previously taking Azor and Dr Royann Shiversroitoru just stopped this medication and started Tribenzor. Both Azor and Tribenzor contain an ARB. I will forward this message to Dr Royann Shiversroitoru to review and make further recommendations.

## 2013-11-26 NOTE — Telephone Encounter (Signed)
I spoke with the pt and made her aware of medication instructions. The pt will monitor BP and contact the office with any other questions or concerns. I spoke with the pharmacist and made her aware of medication changes.

## 2013-11-26 NOTE — Telephone Encounter (Signed)
Would continue tribenzor and DC tekturna; increase hydralazine to 25 mg po TID; follow BP; hydralazine could be increased further if needed. Olga MillersBrian Crenshaw

## 2013-11-26 NOTE — Telephone Encounter (Signed)
Spoke to patient she states her pharmacist Gery PrayBarry will not fill medications (tekturna and tribenzor )  Due to possible interactions. RN informed patient Dr Royann Shiversroitoru was aware, that is why he order a BMP one month after she Started the medications. She states she has not started  tribenzor yet. RN informed patient once she starts the medications to due lab work in  [employer].   RN will defer Belenda CruiseKristin and Dr Jens Somrenshaw (D.O.D) for today, Dr Royann Shiversroitoru is [place] of town

## 2013-11-30 NOTE — Telephone Encounter (Signed)
Well aware of the ARB/aliskiren interaction in diabetics, discussed that with patient in clinic. She has been on that combo for years. Still, her BP has been very difficult to control. Will wait and see where her BP ends up.

## 2013-12-06 ENCOUNTER — Telehealth: Payer: Self-pay | Admitting: Endocrinology

## 2013-12-06 ENCOUNTER — Ambulatory Visit: Payer: Medicare Other | Admitting: Cardiovascular Disease

## 2013-12-06 NOTE — Telephone Encounter (Signed)
Pt needs rx for test strips and lancets that her insurance will cover. Pharmacy called and let us know of what they cover already.

## 2013-12-08 ENCOUNTER — Other Ambulatory Visit: Payer: Self-pay | Admitting: Endocrinology

## 2013-12-27 LAB — BASIC METABOLIC PANEL
BUN: 11 mg/dL (ref 6–23)
CALCIUM: 9.6 mg/dL (ref 8.4–10.5)
CO2: 29 mEq/L (ref 19–32)
Chloride: 99 mEq/L (ref 96–112)
Creat: 0.76 mg/dL (ref 0.50–1.10)
GLUCOSE: 133 mg/dL — AB (ref 70–99)
Potassium: 4.7 mEq/L (ref 3.5–5.3)
Sodium: 136 mEq/L (ref 135–145)

## 2014-01-02 ENCOUNTER — Other Ambulatory Visit: Payer: Self-pay | Admitting: Endocrinology

## 2014-01-02 ENCOUNTER — Telehealth: Payer: Self-pay | Admitting: Endocrinology

## 2014-01-02 NOTE — Telephone Encounter (Signed)
Patient need Victoza 18 ml / 3 ml fax to her pharmacy today, she is going to [place] in the morning.

## 2014-01-09 ENCOUNTER — Other Ambulatory Visit: Payer: Self-pay | Admitting: Endocrinology

## 2014-01-21 ENCOUNTER — Telehealth: Payer: Self-pay | Admitting: Endocrinology

## 2014-01-21 NOTE — Telephone Encounter (Signed)
Patient would like her lab work drawn at her medical doctor office, 01-25-14 lab appt. Dr Juline Patch, could the lab order be faxed over to his office Fax# [phone] please advise

## 2014-01-28 ENCOUNTER — Other Ambulatory Visit: Payer: Medicare Other

## 2014-01-31 ENCOUNTER — Other Ambulatory Visit: Payer: Self-pay | Admitting: *Deleted

## 2014-01-31 ENCOUNTER — Other Ambulatory Visit: Payer: Self-pay | Admitting: Endocrinology

## 2014-01-31 ENCOUNTER — Ambulatory Visit (INDEPENDENT_AMBULATORY_CARE_PROVIDER_SITE_OTHER): Payer: TRICARE For Life (TFL) | Admitting: Endocrinology

## 2014-01-31 ENCOUNTER — Encounter: Payer: Self-pay | Admitting: Endocrinology

## 2014-01-31 VITALS — BP 138/71 | HR 103 | Temp 98.4°F | Resp 16 | Ht 63.0 in | Wt 225.0 lb

## 2014-01-31 DIAGNOSIS — E1165 Type 2 diabetes mellitus with hyperglycemia: Secondary | ICD-10-CM

## 2014-01-31 DIAGNOSIS — E038 Other specified hypothyroidism: Secondary | ICD-10-CM

## 2014-01-31 DIAGNOSIS — IMO0002 Reserved for concepts with insufficient information to code with codable children: Secondary | ICD-10-CM

## 2014-01-31 DIAGNOSIS — E1142 Type 2 diabetes mellitus with diabetic polyneuropathy: Secondary | ICD-10-CM | POA: Diagnosis not present

## 2014-01-31 DIAGNOSIS — E063 Autoimmune thyroiditis: Secondary | ICD-10-CM

## 2014-01-31 LAB — BASIC METABOLIC PANEL
BUN: 12 mg/dL (ref 6–23)
CHLORIDE: 95 meq/L — AB (ref 96–112)
CO2: 29 mEq/L (ref 19–32)
Calcium: 9.4 mg/dL (ref 8.4–10.5)
Creatinine, Ser: 0.92 mg/dL (ref 0.40–1.20)
GFR: 63.64 mL/min (ref >60.00)
GLUCOSE: 150 mg/dL — AB (ref 70–99)
Potassium: 4.3 mEq/L (ref 3.5–5.1)
SODIUM: 131 meq/L — AB (ref 135–145)

## 2014-01-31 LAB — T4, FREE: Free T4: 0.86 ng/dL (ref 0.60–1.60)

## 2014-01-31 LAB — TSH: TSH: 1.65 u[IU]/mL (ref 0.35–4.50)

## 2014-01-31 LAB — HEMOGLOBIN A1C: Hgb A1c MFr Bld: 7.1 % — ABNORMAL HIGH (ref 4.6–6.5)

## 2014-01-31 MED ORDER — LIRAGLUTIDE 18 MG/3ML ~~LOC~~ SOPN: PEN_INJECTOR | SUBCUTANEOUS | Status: DC

## 2014-01-31 MED ORDER — GABAPENTIN 300 MG PO CAPS: 300.0000 mg | ORAL_CAPSULE | Freq: Three times a day (TID) | ORAL | Status: DC

## 2014-01-31 NOTE — Progress Notes (Signed)
Patient ID: [name], female   DOB: Feb 03, 1940, 74 y.o.   MRN: [id number]    Reason for Appointment: Followup for Type 2 Diabetes  History of Present Illness:          Diagnosis: Type 2 diabetes mellitus, date of diagnosis:2011       Past history: Since she was apparently intolerant to metformin she was treated with Onglyza until about 2014  At that time because of increasing A1c of about 8% she was started on Levemir 15 units and this was aggressively increased Onglyza was continued She has not tried any other treatments for diabetes Her A1c has been 10-10.5 in 2015 In 3/15 she was told to start small doses of NovoLog at lunch and supper and add 15 units of Levemir at night also To control  hyperglycemia with her basal bolus insulin regimen she was given Victoza in addition on her initial consultation in 5/15  Recent history:  She is on a basal bolus insulin regimen including Levemir twice a day She has been on Victoza since her visit in 8/15 and she has tolerated 1.2 mg Her blood sugars are very well controlled overall although still having some fluctuation especially in the evenings  Current blood sugar patterns and problems:  Fasting blood sugars are overall relatively stable and averaging about 125   She has sporadic high readings around lunchtime or supper time but not consistent with the highest reading 294 after breakfast  Most of her high readings are related to her not taking insulin when proceeding blood sugar was near-normal  Has had only hypoglycemia once after lunch  Blood sugars are somewhat better in the last week although relatively higher around lunchtime  Checking blood sugars only occasionally after supper  Despite instructions he is still not taking any insulin when her blood sugars near 100 for fear of hypoglycemia       Oral hypoglycemic drugs the patient is taking are: None   Side effects from medications have been: Metformin: rash INSULIN  regimen is described as: Novolog ac 10; Levemir 30 am and 30 pm  Glucose monitoring:  done 1-3 time a day         Glucometer:  One Touch.      Blood Glucose readings from home glucose meter download:  PRE-MEAL Breakfast Lunch Dinner  PCS  Overall  Glucose range:  85-140   107-243   74-228   83-175   43-294   Mean/median:     128 +/-40    POST-MEAL PC Breakfast PC Lunch PC Dinner  Glucose range:  294, 168     Mean/median:      Glycemic control:   A1c was 6.7% in 1/16 from PCP  Lab Results  Component Value Date   HGBA1C 7.1* 01/31/2014   HGBA1C 7.1* 10/31/2013   HGBA1C 9.5* 07/06/2013   Lab Results  Component Value Date   LDLCALC 100* 07/09/2010   CREATININE 0.92 01/31/2014    Self-care: The diet that the patient has been following is: tries to limit fats     Meals: 3 meals per day.  cutting back on frequent meals and  portions; no hs snacks          Exercise: Walk a little        Dietician visit: Most recent: 2014 ( class).               Compliance with the medical regimen: Good  Retinal exam: Most recent:.9/14  Weight history:  Wt Readings from Last 3 Encounters:  01/31/14 225 lb (102.059 kg)  11/21/13 227 lb (102.967 kg)  11/14/13 225 lb (102.059 kg)      Medication List       This list is accurate as of: 01/31/14  9:14 PM.  Always use your most recent med list.               aspirin EC 81 MG tablet  Take 162 mg by mouth daily.     bisoprolol 5 MG tablet  Commonly known as:  ZEBETA  Take 10 mg by mouth 2 (two) times daily.     carbamazepine 200 MG 12 hr capsule  Commonly known as:  CARBATROL  Take 200 mg by mouth 2 (two) times daily.     DEXILANT 60 MG capsule  Generic drug:  dexlansoprazole  Take 60 mg by mouth daily.     donepezil 10 MG tablet  Commonly known as:  ARICEPT  Take 10 mg by mouth at bedtime.     folic acid 1 MG tablet  Commonly known as:  FOLVITE  Take 1 mg by mouth daily.     gabapentin 300 MG capsule  Commonly known as:   NEURONTIN  Take 1 capsule (300 mg total) by mouth 3 (three) times daily.     hydrALAZINE 25 MG tablet  Commonly known as:  APRESOLINE  Take 1 tablet (25 mg total) by mouth 3 (three) times daily.     hydrOXYzine 50 MG tablet  Commonly known as:  ATARAX/VISTARIL  Take 50 mg by mouth 3 (three) times daily as needed.     Insulin Pen Needle 32G X 4 MM Misc  Use 8 pen needles per day     LEVEMIR FLEXTOUCH [place]  Inject 30-35 Units into the skin 2 (two) times daily. Takes 30 units in the morning and 35 units at night     levothyroxine 75 MCG tablet  Commonly known as:  SYNTHROID, LEVOTHROID  Take 75 mcg by mouth daily.     lurasidone 80 MG Tabs tablet  Commonly known as:  LATUDA  Take 160 mg by mouth daily with breakfast.     meclizine 25 MG tablet  Commonly known as:  ANTIVERT  Take 1 tablet (25 mg total) by mouth 3 (three) times daily as needed.     methotrexate 2.5 MG tablet  Commonly known as:  RHEUMATREX  Take 7.5 mg by mouth once a week. Pt takes 1 tablet once a week     nitroGLYCERIN 0.4 MG SL tablet  Commonly known as:  NITROSTAT  Place 0.4 mg under the tongue every 5 (five) minutes as needed.     NOVOLOG FLEXPEN 100 UNIT/ML FlexPen  Generic drug:  insulin aspart  INJECT 10 UNITS BEFORE BREAKFAST, LUNCH AND DINNER EVERY DAY.     OLANZapine 5 MG tablet  Commonly known as:  ZYPREXA  Take 5 mg by mouth at bedtime.     Olmesartan-Amlodipine-HCTZ 40-10-12.5 MG Tabs  Take 1 tablet by mouth daily.     ondansetron 8 MG tablet  Commonly known as:  ZOFRAN  Take 1 tablet (8 mg total) by mouth every 8 (eight) hours as needed for nausea.     ranitidine 150 MG capsule  Commonly known as:  ZANTAC  Take 150 mg by mouth every evening.     TOVIAZ 8 MG Tb24 tablet  Generic drug:  fesoterodine  Take 8 mg by mouth daily.  VICTOZA 18 MG/3ML Sopn  Generic drug:  Liraglutide  INJECT 0.2 MLS  (1.2 MG) DAILY AS DIRECTED     Liraglutide 18 MG/3ML Sopn  Commonly known as:   VICTOZA  (1.2 MG) DAILY AS DIRECTED     Vitamin D 2000 UNITS tablet  Take 2,000 Units by mouth daily.        Allergies:  Allergies  Allergen Reactions  . Flagyl [Metronidazole Hcl] Itching and Rash  . Metformin And Related Rash  . Milk-Related Compounds   . Peanuts [Peanut Oil]     [place] NUTS ONLY  . Ciprofloxacin Itching and Rash    Past Medical History  Diagnosis Date  . Thyroid disease   . Hypertension   . Ischemic colitis, enteritis, or enterocolitis   . Sleep apnea   . Urinary bladder incontinence   . GERD (gastroesophageal reflux disease)   . Hyperlipidemia   . Diabetes mellitus     diet controlled  . Stroke     x's 2  . Depression     Bipolar    Past Surgical History  Procedure Laterality Date  . Retinal detachment surgery    . Appendectomy    . Tonsillectomy    . Hemorrhoid surgery    . Fracture surgery    . Cholecystectomy    . Abdominal hysterectomy      partial     Family History  Problem Relation Age of Onset  . Heart disease Mother   . Stroke Father   . Cancer Sister   . Cancer Brother     Social History:  reports that she has never smoked. She has never used smokeless tobacco. She reports that she does not drink alcohol or use illicit drugs.    Review of Systems   Legs hurt on walking from the knee down, not as much in the feet.  No excessive burning, tingling or sharp pains in her feet.  Does not think her symptoms are worse when lying down and get better with rest.  Also having difficulty standing up for long  She  thinks that her complaints of marked malaise and weakness were related to her depression rather than any organic etiology Has done better since her visit with psychiatrist  Get short of breath on exertion  Asking about numbness in the base of the left great toe      Lipids: Last LDL from PCP is 88 in 1/15, no recent levels available       Lab Results  Component Value Date   CHOL 187 07/09/2010   HDL 64 07/09/2010    LDLCALC 100* 07/09/2010   TRIG 113 07/09/2010   CHOLHDL 2.9 07/09/2010       Thyroid:   Has had presumed hypothyroidism for over 20 years, has not had a change in medications in several years and last TSH was  2.3 on 10/19/13 from PCP      The blood pressure has been controlled with amlodipine/Benicar/HCTZ  Blood pressure is fairly well controlled  Not checking at home  Physical Examination:  BP 138/71 mmHg  Pulse 103  Temp(Src) 98.4 F (36.9 C)  Resp 16  Ht 5\' 3"  (1.6 m)  Wt 225 lb (102.059 kg)  BMI 39.87 kg/m2  SpO2 97%  She has 2+ edema of her lower legs  Diabetic foot exam shows normal monofilament sensation in the toes and plantar surfaces, no skin lesions or ulcers on the feet and normal pedal pulses     ASSESSMENT/PLAN:  Diabetes type 2, uncontrolled   Her blood sugars have been overall fairly good with her regimen of basal bolus insulin and Victoza which she is tolerating well A1c from PCPs office shows the level to be 6.7 Although she is taking less Levemir done directed her fasting blood sugars are fairly consistent Does have some variability in her blood sugars based on her diet and this may be sometimes unbalanced with less protein Also she is still afraid of hypoglycemia and not taking NovoLog when her blood sugar is below 105 or so  Discussed need to cover every meal with NovoLog even if she reduces the dose when blood sugars are near normal Since fasting blood sugars are fairly good she can continue same doses of Levemir, 30 units twice a day She will continue Victoza She does need to check more blood sugars after evening meal  Leg pains: Although these are mostly on walking she may have neuropathic symptoms as the symptoms are atypical for any kind of claudication She will empirically start gabapentin also discuss with PCP  Hypertension: Blood pressure is controlled  Edema: She will discuss this with PCP especially since she is taking 10 mg  amlodipine  Hypothyroidism: Will check TSH today   Patient Instructions  Take Novolog with every meal regardless of blood sugar.  If blood sugars are below 110 may reduce the dose by 2-3 units Make sure have protein with every meal Please check blood sugars at least half the time about 2 hours after any meal including after supper and as directed on waking up. Please bring blood sugar monitor to each visit  Gabapentin 300 mg with food as needed for leg pains  Amlodipine 10 mg may be causing swelling, this is part of your blood pressure medicine   Counseling time over 50% of today's 25 minute visit   [name] 01/31/2014, 9:14 PM   Note: This office note was prepared with Insurance underwriter. Any transcriptional errors that result from this process are unintentional.  Addendum: TSH is normal  Office Visit on 01/31/2014  Component Date Value Ref Range Status  . Hgb A1c MFr Bld 01/31/2014 7.1* 4.6 - 6.5 % Final   Glycemic Control Guidelines for People with Diabetes:Non Diabetic:  <6%Goal of Therapy: <7%Additional Action Suggested:  >8%   . Sodium 01/31/2014 131* 135 - 145 mEq/L Final  . Potassium 01/31/2014 4.3  3.5 - 5.1 mEq/L Final  . Chloride 01/31/2014 95* 96 - 112 mEq/L Final  . CO2 01/31/2014 29  19 - 32 mEq/L Final  . Glucose, Bld 01/31/2014 150* 70 - 99 mg/dL Final  . BUN 16/10/9602 12  6 - 23 mg/dL Final  . Creatinine, Ser 01/31/2014 0.92  0.40 - 1.20 mg/dL Final  . Calcium 54/09/8117 9.4  8.4 - 10.5 mg/dL Final  . GFR 14/78/2956 63.64  >60.00 mL/min Final  . TSH 01/31/2014 1.65  0.35 - 4.50 uIU/mL Final  . Free T4 01/31/2014 0.86  0.60 - 1.60 ng/dL Final

## 2014-01-31 NOTE — Patient Instructions (Signed)
Take Novolog with every meal regardless of blood sugar.  If blood sugars are below 110 may reduce the dose by 2-3 units Make sure have protein with every meal Please check blood sugars at least half the time about 2 hours after any meal including after supper and as directed on waking up. Please bring blood sugar monitor to each visit  Gabapentin 300 mg with food as needed for leg pains  Amlodipine 10 mg may be causing swelling, this is part of your blood pressure medicine

## 2014-02-04 ENCOUNTER — Encounter: Payer: Self-pay | Admitting: Cardiovascular Disease

## 2014-03-04 ENCOUNTER — Telehealth: Payer: Self-pay | Admitting: Endocrinology

## 2014-03-04 MED ORDER — GLUCOSE BLOOD VI STRP: ORAL_STRIP | Status: DC

## 2014-03-04 MED ORDER — FREESTYLE LANCETS MISC: Status: DC

## 2014-03-04 NOTE — Telephone Encounter (Signed)
Patient called stating that she would like her Rx's called in   [facility]   Lancets  Test Strips   Glucometer: Freestyle Lite

## 2014-03-04 NOTE — Telephone Encounter (Signed)
[facility] advising rx has been sent per request.

## 2014-04-09 ENCOUNTER — Other Ambulatory Visit (HOSPITAL_COMMUNITY): Payer: Self-pay | Admitting: Internal Medicine

## 2014-04-09 DIAGNOSIS — Z1231 Encounter for screening mammogram for malignant neoplasm of breast: Secondary | ICD-10-CM

## 2014-04-15 ENCOUNTER — Ambulatory Visit (HOSPITAL_COMMUNITY)
Admission: RE | Admit: 2014-04-15 | Discharge: 2014-04-15 | Disposition: A | Discharge status: Home / Self Care | Payer: Medicare Other | Source: Ambulatory Visit | Attending: Internal Medicine | Admitting: Internal Medicine

## 2014-04-15 DIAGNOSIS — Z1231 Encounter for screening mammogram for malignant neoplasm of breast: Secondary | ICD-10-CM | POA: Diagnosis present

## 2014-04-26 ENCOUNTER — Other Ambulatory Visit (INDEPENDENT_AMBULATORY_CARE_PROVIDER_SITE_OTHER): Payer: Medicare Other

## 2014-04-26 DIAGNOSIS — E1165 Type 2 diabetes mellitus with hyperglycemia: Secondary | ICD-10-CM | POA: Diagnosis not present

## 2014-04-26 DIAGNOSIS — IMO0002 Reserved for concepts with insufficient information to code with codable children: Secondary | ICD-10-CM

## 2014-04-26 LAB — LIPID PANEL
Cholesterol: 153 mg/dL (ref 0–200)
HDL: 55.6 mg/dL (ref >39.00)
LDL Cholesterol: 75 mg/dL (ref 0–99)
NONHDL: 97.4
Total CHOL/HDL Ratio: 3
Triglycerides: 113 mg/dL (ref 0.0–149.0)
VLDL: 22.6 mg/dL (ref 0.0–40.0)

## 2014-04-26 LAB — COMPREHENSIVE METABOLIC PANEL
ALBUMIN: 3.8 g/dL (ref 3.5–5.2)
ALK PHOS: 104 U/L (ref 39–117)
ALT: 20 U/L (ref 0–35)
AST: 17 U/L (ref 0–37)
BILIRUBIN TOTAL: 0.3 mg/dL (ref 0.2–1.2)
BUN: 11 mg/dL (ref 6–23)
CO2: 27 mEq/L (ref 19–32)
Calcium: 9.1 mg/dL (ref 8.4–10.5)
Chloride: 92 mEq/L — ABNORMAL LOW (ref 96–112)
Creatinine, Ser: 0.71 mg/dL (ref 0.40–1.20)
GFR: 85.77 mL/min (ref >60.00)
Glucose, Bld: 134 mg/dL — ABNORMAL HIGH (ref 70–99)
POTASSIUM: 4.3 meq/L (ref 3.5–5.1)
SODIUM: 127 meq/L — AB (ref 135–145)
TOTAL PROTEIN: 6.7 g/dL (ref 6.0–8.3)

## 2014-04-26 LAB — HEMOGLOBIN A1C: Hgb A1c MFr Bld: 6.6 % — ABNORMAL HIGH (ref 4.6–6.5)

## 2014-04-29 ENCOUNTER — Other Ambulatory Visit: Payer: Medicare Other

## 2014-05-02 ENCOUNTER — Ambulatory Visit (INDEPENDENT_AMBULATORY_CARE_PROVIDER_SITE_OTHER): Payer: Medicare Other | Admitting: Endocrinology

## 2014-05-02 ENCOUNTER — Encounter: Payer: Self-pay | Admitting: Endocrinology

## 2014-05-02 VITALS — BP 116/68 | HR 74 | Temp 97.7°F | Ht 63.0 in | Wt 229.0 lb

## 2014-05-02 DIAGNOSIS — E119 Type 2 diabetes mellitus without complications: Secondary | ICD-10-CM

## 2014-05-02 DIAGNOSIS — I10 Essential (primary) hypertension: Secondary | ICD-10-CM

## 2014-05-02 NOTE — Progress Notes (Signed)
Patient ID: [name], female   DOB: May 16, 1940, 74 y.o.   MRN: [id number]    Reason for Appointment: Followup for Type 2 Diabetes  History of Present Illness:          Diagnosis: Type 2 diabetes mellitus, date of diagnosis:2011       Past history: Since she was apparently intolerant to metformin she was treated with Onglyza until about 2014  At that time because of increasing A1c of about 8% she was started on Levemir 15 units and this was aggressively increased Onglyza was continued She has not tried any other treatments for diabetes Her A1c has been 10-10.5 in 2015 In 3/15 she was told to start small doses of NovoLog at lunch and supper and add 15 units of Levemir at night also To control  hyperglycemia with her basal bolus insulin regimen she was given Victoza in addition on her initial consultation in 5/15  Recent history:  She is on a basal bolus insulin regimen including Levemir twice a day She has been on Victoza since her visit in 8/15 and she has tolerated 1.2 mg Her blood sugars are again very well controlled and appeared to be much more consistent now  Current blood sugar patterns and problems:  Fasting blood sugars are somewhat variable although an average still fairly good.  She only has occasional high readings after her lunch or supper based on what she is eating  However most of her readings after breakfast and lunch are excellent  Checking blood sugars after supper very occasionally and these are mostly fairly good  No hypoglycemia currently with the lowest reading 74 and overall the lowest readings are probably before lunch  She is not always getting protein with her carbohydrate at breakfast and sometimes will have only oatmeal or cereal  She is overall trying to do well with portions and avoid high-fat meals  She may be occasionally having high readings after meals when she reduces her pre-meal dose based on the blood sugar level       Oral  hypoglycemic drugs the patient is taking are: None   Side effects from medications have been: Metformin: rash INSULIN regimen is described as: Novolog ac 10; Levemir 30 am and 30 pm  Glucose monitoring:  done 1-3 time a day         Glucometer:  One Touch.      Blood Glucose readings from home glucose meter download:  PRE-MEAL Breakfast  midday  Dinner  PCS  Overall  Glucose range:  94-144   74-268   109-124   107-186     median:  128   105    112   Glycemic control:   A1c was 6.7% in 1/16 from PCP  Lab Results  Component Value Date   HGBA1C 6.6* 04/26/2014   HGBA1C 7.1* 01/31/2014   HGBA1C 7.1* 10/31/2013   Lab Results  Component Value Date   LDLCALC 75 04/26/2014   CREATININE 0.71 04/26/2014    Self-care: The diet that the patient has been following is: tries to limit fats     Meals: 3 meals per day. Bfst oatmeal or egg/toast at 8-9 am, dinner 5 pm cutting back on frequent meals and  portions; no hs snacks          Exercise: Walk a little        Dietician visit: Most recent: 2014 ( class).  Compliance with the medical regimen: Good  Retinal exam: Most recent:.9/14    Weight history:  Wt Readings from Last 3 Encounters:  05/02/14 229 lb (103.874 kg)  01/31/14 225 lb (102.059 kg)  11/21/13 227 lb (102.967 kg)      Medication List       This list is accurate as of: 05/02/14  4:05 PM.  Always use your most recent med list.               aspirin EC 81 MG tablet  Take 162 mg by mouth daily.     bisoprolol 5 MG tablet  Commonly known as:  ZEBETA  Take 10 mg by mouth 2 (two) times daily.     carbamazepine 200 MG 12 hr capsule  Commonly known as:  CARBATROL  Take 200 mg by mouth 2 (two) times daily.     DEXILANT 60 MG capsule  Generic drug:  dexlansoprazole  Take 60 mg by mouth daily.     donepezil 10 MG tablet  Commonly known as:  ARICEPT  Take 10 mg by mouth at bedtime.     folic acid 1 MG tablet  Commonly known as:  FOLVITE  Take 1 mg  by mouth daily.     freestyle lancets  Use 5/day     gabapentin 300 MG capsule  Commonly known as:  NEURONTIN  Take 1 capsule (300 mg total) by mouth 3 (three) times daily.     glucose blood test strip  Commonly known as:  FREESTYLE TEST STRIPS  Use 5 times per day     hydrALAZINE 25 MG tablet  Commonly known as:  APRESOLINE  Take 1 tablet (25 mg total) by mouth 3 (three) times daily.     hydrOXYzine 50 MG tablet  Commonly known as:  ATARAX/VISTARIL  Take 50 mg by mouth 3 (three) times daily as needed.     Insulin Pen Needle 32G X 4 MM Misc  Use 8 pen needles per day     LEVEMIR FLEXTOUCH [place]  Inject 30-35 Units into the skin 2 (two) times daily. Takes 30 units in the morning and 35 units at night     levothyroxine 75 MCG tablet  Commonly known as:  SYNTHROID, LEVOTHROID  Take 75 mcg by mouth daily.     Liraglutide 18 MG/3ML Sopn  Commonly known as:  VICTOZA  (1.2 MG) DAILY AS DIRECTED     lurasidone 80 MG Tabs tablet  Commonly known as:  LATUDA  Take 160 mg by mouth daily with breakfast.     meclizine 25 MG tablet  Commonly known as:  ANTIVERT  Take 1 tablet (25 mg total) by mouth 3 (three) times daily as needed.     methotrexate 2.5 MG tablet  Commonly known as:  RHEUMATREX  Take 7.5 mg by mouth once a week. Pt takes 1 tablet once a week     nitroGLYCERIN 0.4 MG SL tablet  Commonly known as:  NITROSTAT  Place 0.4 mg under the tongue every 5 (five) minutes as needed.     NOVOLOG FLEXPEN 100 UNIT/ML FlexPen  Generic drug:  insulin aspart  INJECT 10 UNITS BEFORE BREAKFAST, LUNCH AND DINNER EVERY DAY.     OLANZapine 5 MG tablet  Commonly known as:  ZYPREXA  Take 5 mg by mouth at bedtime.     Olmesartan-Amlodipine-HCTZ 40-10-12.5 MG Tabs  Take 1 tablet by mouth daily.     ondansetron 8 MG tablet  Commonly known as:  ZOFRAN  Take 1 tablet (8 mg total) by mouth every 8 (eight) hours as needed for nausea.     ranitidine 150 MG capsule  Commonly known as:   ZANTAC  Take 150 mg by mouth every evening.     TOVIAZ 8 MG Tb24 tablet  Generic drug:  fesoterodine  Take 8 mg by mouth daily.     Vitamin D 2000 UNITS tablet  Take 2,000 Units by mouth daily.        Allergies:  Allergies  Allergen Reactions  . Flagyl [Metronidazole Hcl] Itching and Rash  . Metformin And Related Rash  . Milk-Related Compounds   . Peanuts [Peanut Oil]     [place] NUTS ONLY  . Ciprofloxacin Itching and Rash    Past Medical History  Diagnosis Date  . Thyroid disease   . Hypertension   . Ischemic colitis, enteritis, or enterocolitis   . Sleep apnea   . Urinary bladder incontinence   . GERD (gastroesophageal reflux disease)   . Hyperlipidemia   . Diabetes mellitus     diet controlled  . Stroke     x's 2  . Depression     Bipolar    Past Surgical History  Procedure Laterality Date  . Retinal detachment surgery    . Appendectomy    . Tonsillectomy    . Hemorrhoid surgery    . Fracture surgery    . Cholecystectomy    . Abdominal hysterectomy      partial     Family History  Problem Relation Age of Onset  . Heart disease Mother   . Stroke Father   . Cancer Sister   . Cancer Brother     Social History:  reports that she has never smoked. She has never used smokeless tobacco. She reports that she does not drink alcohol or use illicit drugs.    Review of Systems   Legs hurt on walking at times  from the knee down, not as much in the feet.  No excessive burning, tingling or sharp pains in her feet.  Taking Gabapentin bid      Asking about numbness in the base of the left great toe      Lipids: Last LDL from PCP         Lab Results  Component Value Date   CHOL 153 04/26/2014   HDL 55.60 04/26/2014   LDLCALC 75 04/26/2014   TRIG 113.0 04/26/2014   CHOLHDL 3 04/26/2014       Thyroid:   Has had presumed hypothyroidism for over 20 years, has not had a change in medications in several years and last TSH was  2.3 on 10/19/13 from PCP    Lab Results  Component Value Date   TSH 1.65 01/31/2014   TSH 0.51 08/28/2013   TSH 0.76 07/05/2013   FREET4 0.86 01/31/2014   FREET4 0.91 08/28/2013       The blood pressure has been controlled with amlodipine/Benicar/HCTZ  Blood pressure is fairly well controlled     Physical Examination:  BP 116/68 mmHg  Pulse 74  Temp(Src) 97.7 F (36.5 C) (Oral)  Ht 5\' 3"  (1.6 m)  Wt 229 lb (103.874 kg)  BMI 40.58 kg/m2  SpO2 95%     ASSESSMENT/PLAN:   Diabetes type 2, uncontrolled   Her blood sugars have been overall excellent more recently with her regimen of basal bolus insulin and Victoza which she is tolerating this well A1c is now 6.6 which is improved  further She does not have any hypoglycemia Also her blood sugar appears to be fairly consistent throughout the day She is better compliant with taking her NovoLog with every meal although she does take very little when her blood sugars below 100 at mealtimes This may cause occasional hyperglycemia but her blood sugars are rarely over 180 after meals Recently not checking enough readings after meals however  Discussed adjustment of NovoLog based on meal size and carbohydrate intake especially at breakfast She can take at least 2 units NovoLog in the morning when she is eating a higher protein meal She will take at least 6-8 units at other meals when she is having blood sugars are near-normal instead of 3-4  Hypertension: Blood pressure is well controlled   Patient Instructions  If eating egg in am take 8 Novolog  Please check blood sugars at least half the time about 2 hours after any meal and 3 times per week on waking up. Please bring blood sugar monitor to each visit. Recommended blood sugar levels about 2 hours after meal is 140-180 and on waking up 90-130      Summer Parthasarathy 05/02/2014, 4:05 PM   Note: This office note was prepared with Insurance underwriter. Any transcriptional errors that  result from this process are unintentional.

## 2014-05-02 NOTE — Progress Notes (Signed)
Pre visit review using our clinic review tool, if applicable. No additional management support is needed unless otherwise documented below in the visit note. 

## 2014-05-02 NOTE — Patient Instructions (Signed)
If eating egg in am take 8 Novolog  Please check blood sugars at least half the time about 2 hours after any meal and 3 times per week on waking up. Please bring blood sugar monitor to each visit. Recommended blood sugar levels about 2 hours after meal is 140-180 and on waking up 90-130

## 2014-05-10 ENCOUNTER — Encounter: Payer: Self-pay | Admitting: *Deleted

## 2014-06-09 ENCOUNTER — Other Ambulatory Visit: Payer: Self-pay | Admitting: Endocrinology

## 2014-06-13 ENCOUNTER — Other Ambulatory Visit: Payer: Self-pay | Admitting: Internal Medicine

## 2014-06-13 DIAGNOSIS — R42 Dizziness and giddiness: Secondary | ICD-10-CM

## 2014-06-20 ENCOUNTER — Ambulatory Visit
Admission: RE | Admit: 2014-06-20 | Discharge: 2014-06-20 | Disposition: A | Discharge status: Home / Self Care | Payer: Medicare Other | Source: Ambulatory Visit | Attending: Internal Medicine | Admitting: Internal Medicine

## 2014-06-20 DIAGNOSIS — R42 Dizziness and giddiness: Secondary | ICD-10-CM

## 2014-06-22 ENCOUNTER — Other Ambulatory Visit: Payer: Self-pay | Admitting: Cardiology

## 2014-06-22 ENCOUNTER — Other Ambulatory Visit: Payer: Self-pay | Admitting: Endocrinology

## 2014-07-16 ENCOUNTER — Ambulatory Visit: Payer: Medicare Other | Attending: Internal Medicine | Admitting: Rehabilitative and Restorative Service Providers"

## 2014-07-16 DIAGNOSIS — H8112 Benign paroxysmal vertigo, left ear: Secondary | ICD-10-CM

## 2014-07-16 DIAGNOSIS — R269 Unspecified abnormalities of gait and mobility: Secondary | ICD-10-CM | POA: Diagnosis not present

## 2014-07-16 NOTE — Patient Instructions (Signed)
Tip Card 1.The goal of habituation training is to assist in decreasing symptoms of vertigo, dizziness, or nausea provoked by specific head and body motions. 2.These exercises may initially increase symptoms; however, be persistent and work through symptoms. With repetition and time, the exercises will assist in reducing or eliminating symptoms. 3.Exercises should be stopped and discussed with the therapist if you experience any of the following: - Sudden change or fluctuation in hearing - New onset of ringing in the ears, or increase in current intensity - Any fluid discharge from the ear - Severe pain in neck or back - Extreme nausea  Rolling   With pillow under head, start on back. Roll slowly to left. Hold position until symptoms stop. Roll slowly onto right side. Hold position until symptoms stop. Repeat sequence ___3-5_ times per session. Do __2__ sessions per day.  Copyright  [employer]. All rights reserved.   Special Instructions: Exercises may bring on mild to moderate symptoms of dizziness  that resolve within 30 minutes of completing exercises. If symptoms are lasting longer than 30 minutes, modify your exercises by:  >decreasing the # of times you complete each activity >ensuring your symptoms return to baseline before moving onto the next exercise >dividing up exercises so you do not do them all in one session, but multiple short sessions throughout the day >doing them once a day until symptoms improve

## 2014-07-17 NOTE — Therapy (Signed)
[facility] [address] [place], [place], [address] Phone: [phone]   Fax:  [phone]  Physical Therapy Evaluation  Patient Details  Name: [name] MRN: [id number] Date of Birth: [birth year] Referring Provider:  Georgianne Fick, MD  Encounter Date: 07/16/2014      PT End of Session - 07/17/14 0951    Visit Number 1   Number of Visits 12   Date for PT Re-Evaluation 09/13/14   Authorization Type G code every 10th visit   PT Start Time 1150   PT Stop Time 1230   PT Time Calculation (min) 40 min   Activity Tolerance Patient tolerated treatment well   Behavior During Therapy [facility] for tasks assessed/performed      Past Medical History  Diagnosis Date  . Thyroid disease   . Hypertension   . Ischemic colitis, enteritis, or enterocolitis   . Sleep apnea   . Urinary bladder incontinence   . GERD (gastroesophageal reflux disease)   . Hyperlipidemia   . Diabetes mellitus     diet controlled  . Stroke     x's 2  . Depression     Bipolar    Past Surgical History  Procedure Laterality Date  . Retinal detachment surgery    . Appendectomy    . Tonsillectomy    . Hemorrhoid surgery    . Fracture surgery    . Cholecystectomy    . Abdominal hysterectomy      partial     There were no vitals filed for this visit.  Visit Diagnosis:  Benign paroxysmal positional vertigo, left  Abnormality of gait      Subjective Assessment - 07/16/14 1158    Subjective The patient reports 2 year h/o intermittent vertigo.  At initial onset, she reports waking with dizziness and it remaining constant > hours.  She continues with worsening episodes of dizziness that last for hours.  Dizziness is aggravated by bending forward, which she avoids and is improved with use of meclizine (had to take 50mg  today before our evaluation).  She denies h/o migraines, notes some hearing changes, denies double vision ("I used to but  haven't had it in a long time", improved with new glasses, which have a prism).  She noted double vision when she first woke up that improved within 1/2 hour of getting up.  She notes her head is held to the side upon waking.   Patient Stated Goals reduce dizziness, improve mobility            [facility] PT Assessment - 07/16/14 1206    Assessment   Medical Diagnosis BPPV   Onset Date/Surgical Date --  2-3 years ago   Prior Therapy none   Precautions   Precautions Fall   Balance Screen   Has the patient fallen in the past 6 months No   Has the patient had a decrease in activity level because of a fear of falling?  Yes   Is the patient reluctant to leave their home because of a fear of falling?  Yes   Home Environment   Living Environment Private residence   Living Arrangements Alone   Type of Home House   Home Access Level entry   Home Layout One level   Prior Function   Level of Independence Independent   Observation/Other Assessments   Focus on Therapeutic Outcomes (FOTO)  staff did not capture            Vestibular Assessment -  07/16/14 1202    Symptom Behavior   Type of Dizziness Spinning  lasts minutes to hours, also notes unsteady, lightheaded   Frequency of Dizziness --  estimates every other day   Duration of Dizziness --  minutes to hours   Aggravating Factors Turning head quickly;Forward bending  sometimes is present upon waking, other times mvmt aggravate   Relieving Factors Medication  Pt has been taking meclizine x 2 years   Occulomotor Exam   Occulomotor Alignment Abnormal  R eye positioned lower than L with R eyelid lower as well   Spontaneous Absent   Gaze-induced Absent   Smooth Pursuits Intact   Saccades Intact   Comment baseline dizziness 4/10 after 50mg  meclizine today, with heavy head sensation   Vestibulo-Occular Reflex   VOR 1 Head Only (x 1 viewing) --  slow gaze x 6 reps increases dizziness to 6/10   Positional Testing   Dix-Hallpike  Dix-Hallpike Left;Dix-Hallpike Right   Sidelying Test Sidelying Right;Sidelying Left   Horizontal Canal Testing Horizontal Canal Right;Horizontal Canal Left   Sidelying Right   Sidelying Right Duration --  dizziness with R sidelying >sit 6/10 lasting seconds   Sidelying Right Symptoms No nystagmus  no dizziness or spinning reported   Sidelying Left   Sidelying Left Duration hard to distinguish direction of nystagmus ? suppressed due to meclizine?   Sidelying Left Symptoms --  mild nystagmus noted in room light   Horizontal Canal Right   Horizontal Canal Right Duration *worse to the left   Horizontal Canal Right Symptoms Ageotrophic  although nystagmus viewed, patient notes 4/10   Horizontal Canal Left   Horizontal Canal Left Duration hard to differentiate direction today ? possibly due to use of meclizine?    Horizontal Canal Left Symptoms Ageotrophic  nystagmus noted with 6/10 dizziness      NEUROMUSCULAR RE-EDUCATION: Habituation HEP for rolling x 4 reps with symptoms improving with repetitions, provided for HEP       PT Education - 07/17/14 0951    Education provided Yes   Education Details HEP: habituation with horizontal roll   Person(s) Educated Patient   Methods Explanation;Demonstration;Handout   Comprehension Returned demonstration;Verbalized understanding          PT Short Term Goals - 07/17/14 0952    PT SHORT TERM GOAL #1   Title The patient will return demo HEP for habituation and gaze adaptation.   Baseline Target date 08/14/2014   Time 4   Period Weeks   PT SHORT TERM GOAL #2   Title The patient will report dizziness improved from 6/10 down to < or equal to 4/10 with sit<>bilateral sidelying.   Baseline target date 08/14/2014   Time 4   Period Weeks   PT SHORT TERM GOAL #3   Title The patient will report dizziness improved with rolling in bed from 6/10 down to< or equal to 4/10 with rolling supine<>bilateral sidelying.    Baseline Target date 08/14/2014    Time 4   Period Weeks   PT SHORT TERM GOAL #4   Title The patient will tolerate gaze x 1 viewing x 30 seconds for improved motion tolerance (tolerates 6 reps at eval).   Baseline Target date 08/14/2014   Time 4   Period Weeks           PT Long Term Goals - 07/17/14 0956    PT LONG TERM GOAL #1   Title The patient will be indep with progression of HEP for post d/c  activities.   Baseline Target date 09/13/2014   Time 8   Period Weeks   PT LONG TERM GOAL #2   Title Further PT assessment as indicated for gait speed and goal to follow if indicated.   Baseline Target date 09/13/2014   Time 8   Period Weeks   PT LONG TERM GOAL #3   Title Further PT assessment as indicated for Berg balance scale.   Baseline Target date 09/13/2014   Time 8   Period Weeks               Plan - 07/17/14 5409    Clinical Impression Statement The patient is a 74 yo female with h/o chronic vertigo that is intermittent in nature.  The patient also has h/o visual changes that may be contributing to overall intensity of symptoms (she reports MDs know about this finding).  PT to address deficits to improve functional mobility.   Pt will benefit from skilled therapeutic intervention in order to improve on the following deficits Abnormal gait;Decreased balance;Dizziness;Decreased mobility;Decreased activity tolerance;Postural dysfunction   Rehab Potential Good   PT Frequency 2x / week   PT Duration 8 weeks  Decreasing to 1x/week after initial 4 weeks   PT Treatment/Interventions Vestibular;Gait training;Neuromuscular re-education;Therapeutic exercise;Balance training;Therapeutic activities;Functional mobility training;Stair training   PT Next Visit Plan Check habituation HEP, add gaze to tolerance and sit<>bilateral sidelying; check Berg and gait speed as able.   Consulted and Agree with Plan of Care Patient          G-Codes - 2014/08/10 1002    Functional Assessment Tool Used Vertigo rated 6/10    Functional Limitation Mobility: Walking and moving around   Mobility: Walking and Moving Around Current Status [phone]) At least 40 percent but less than 60 percent impaired, limited or restricted   Mobility: Walking and Moving Around Goal Status [phone]) At least 20 percent but less than 40 percent impaired, limited or restricted       Problem List Patient Active Problem List   Diagnosis Date Noted  . Chest pain at rest 10/30/2013  . Exertional dyspnea 10/30/2013  . Hyperlipidemia 10/30/2013  . Morbid obesity 10/30/2013  . Acquired autoimmune hypothyroidism 09/02/2013  . Type II or unspecified type diabetes mellitus without mention of complication, uncontrolled 06/14/2013  . Essential hypertension, benign 06/14/2013  . Other and unspecified hyperlipidemia 06/14/2013    Nayel Purdy, PT 07/17/2014, 10:03 AM  [facility] [address] [place], [place], [address] Phone: [phone]   Fax:  [phone]

## 2014-07-22 ENCOUNTER — Other Ambulatory Visit: Payer: Self-pay | Admitting: Endocrinology

## 2014-07-22 ENCOUNTER — Other Ambulatory Visit: Payer: Self-pay | Admitting: Cardiology

## 2014-07-23 ENCOUNTER — Other Ambulatory Visit: Payer: Self-pay | Admitting: *Deleted

## 2014-07-23 MED ORDER — INSULIN ASPART 100 UNIT/ML FLEXPEN: PEN_INJECTOR | SUBCUTANEOUS | Status: DC

## 2014-07-24 NOTE — Telephone Encounter (Signed)
Rx(s) sent to pharmacy electronically.  

## 2014-07-26 ENCOUNTER — Other Ambulatory Visit: Payer: Medicare Other

## 2014-07-30 ENCOUNTER — Ambulatory Visit: Payer: Medicare Other | Admitting: Endocrinology

## 2014-07-30 ENCOUNTER — Other Ambulatory Visit (INDEPENDENT_AMBULATORY_CARE_PROVIDER_SITE_OTHER): Payer: Medicare Other

## 2014-07-30 DIAGNOSIS — E119 Type 2 diabetes mellitus without complications: Secondary | ICD-10-CM | POA: Diagnosis not present

## 2014-07-30 LAB — BASIC METABOLIC PANEL
BUN: 10 mg/dL (ref 6–23)
CHLORIDE: 94 meq/L — AB (ref 96–112)
CO2: 30 meq/L (ref 19–32)
CREATININE: 0.73 mg/dL (ref 0.40–1.20)
Calcium: 9 mg/dL (ref 8.4–10.5)
GFR: 83 mL/min (ref >60.00)
Glucose, Bld: 126 mg/dL — ABNORMAL HIGH (ref 70–99)
POTASSIUM: 4.1 meq/L (ref 3.5–5.1)
Sodium: 131 mEq/L — ABNORMAL LOW (ref 135–145)

## 2014-07-30 LAB — HEMOGLOBIN A1C: Hgb A1c MFr Bld: 6.8 % — ABNORMAL HIGH (ref 4.6–6.5)

## 2014-07-31 ENCOUNTER — Other Ambulatory Visit: Payer: Self-pay | Admitting: *Deleted

## 2014-07-31 DIAGNOSIS — IMO0002 Reserved for concepts with insufficient information to code with codable children: Secondary | ICD-10-CM

## 2014-07-31 DIAGNOSIS — E1165 Type 2 diabetes mellitus with hyperglycemia: Secondary | ICD-10-CM

## 2014-08-02 ENCOUNTER — Ambulatory Visit: Payer: Medicare Other | Admitting: Endocrinology

## 2014-08-06 ENCOUNTER — Other Ambulatory Visit: Payer: Self-pay | Admitting: *Deleted

## 2014-08-06 MED ORDER — LIRAGLUTIDE 18 MG/3ML ~~LOC~~ SOPN: PEN_INJECTOR | SUBCUTANEOUS | Status: DC

## 2014-08-19 ENCOUNTER — Ambulatory Visit: Payer: Medicare Other | Attending: Internal Medicine | Admitting: Rehabilitative and Restorative Service Providers"

## 2014-08-19 DIAGNOSIS — H8112 Benign paroxysmal vertigo, left ear: Secondary | ICD-10-CM | POA: Diagnosis not present

## 2014-08-19 DIAGNOSIS — R269 Unspecified abnormalities of gait and mobility: Secondary | ICD-10-CM | POA: Diagnosis present

## 2014-08-19 NOTE — Therapy (Signed)
[place] [address] [place], [place], [address] Phone: [phone]   Fax:  [phone]  Physical Therapy Treatment  Patient Details  Name: [name] MRN: [id number] Date of Birth: 16-Sep-1940 Referring Provider:  Tommy Medal, MD  Encounter Date: 08/19/2014      PT End of Session - 08/19/14 1410    Visit Number 2   Number of Visits 12   Date for PT Re-Evaluation 09/13/14   Authorization Type G code every 10th visit   PT Start Time 1105   PT Stop Time 1150   PT Time Calculation (min) 45 min   Activity Tolerance Patient tolerated treatment well   Behavior During Therapy [facility] for tasks assessed/performed      Past Medical History  Diagnosis Date  . Thyroid disease   . Hypertension   . Ischemic colitis, enteritis, or enterocolitis   . Sleep apnea   . Urinary bladder incontinence   . GERD (gastroesophageal reflux disease)   . Hyperlipidemia   . Diabetes mellitus     diet controlled  . Stroke     x's 2  . Depression     Bipolar    Past Surgical History  Procedure Laterality Date  . Retinal detachment surgery    . Appendectomy    . Tonsillectomy    . Hemorrhoid surgery    . Fracture surgery    . Cholecystectomy    . Abdominal hysterectomy      partial     There were no vitals filed for this visit.  Visit Diagnosis:  Benign paroxysmal positional vertigo, left  Abnormality of gait      Subjective Assessment - 08/19/14 1110    Subjective The patient reports 50% better since beginning therapy.  She notes bending forward aggravates dizziness.  She is noting less frequency of dizziness currently reporting 2-3 days/week.   Four bouts of severe dizziness since eval on 07/16/2014.            [facility] PT Assessment - 08/19/14 1140    Standardized Balance Assessment   Standardized Balance Assessment Berg Balance Test   Berg Balance Test   Sit to Stand Able to stand  independently using hands    Standing Unsupported Able to stand safely 2 minutes   Sitting with Back Unsupported but Feet Supported on Floor or Stool Able to sit safely and securely 2 minutes   Stand to Sit Controls descent by using hands   Transfers Able to transfer safely, definite need of hands   Standing Unsupported with Eyes Closed Able to stand 3 seconds   Standing Ubsupported with Feet Together Able to place feet together independently but unable to hold for 30 seconds   From Standing, Reach Forward with Outstretched Arm Can reach forward >5 cm safely (2")   From Standing Position, Pick up Object from Floor Able to pick up shoe, needs supervision  5-6/10 dizziness   From Standing Position, Turn to Look Behind Over each Shoulder Turn sideways only but maintains balance   Turn 360 Degrees Able to turn 360 degrees safely but slowly  6/10 dizziness to each side   Standing Unsupported, Alternately Place Feet on Step/Stool Able to complete >2 steps/needs minimal assist   Standing Unsupported, One Foot in Front Able to take small step independently and hold 30 seconds   Standing on One Leg Tries to lift leg/unable to hold 3 seconds but remains standing independently   Total Score 34   High  Level Balance   High Level Balance Comments 34/56 indicating high fall risk            Vestibular Assessment - 08/19/14 1114    Positional Testing   Horizontal Canal Testing Horizontal Canal Right;Horizontal Canal Left   Sidelying Right   Sidelying Right Symptoms No nystagmus  lightheaded with return to sitting   Sidelying Left   Sidelying Left Duration 2 minutes for subjective dizziness   Sidelying Left Symptoms Upbeat, left rotatory nystagmus   Horizontal Canal Right   Horizontal Canal Right Symptoms --  no dizziness today   Horizontal Canal Left   Horizontal Canal Left Symptoms --  no dizziness today           Vestibular Treatment/Exercise - 08/19/14 1124    Vestibular Treatment/Exercise   Vestibular  Treatment Provided Canalith Repositioning;Habituation;Gaze   Canalith Repositioning Epley Manuever Left   Habituation Exercises Brandt Daroff;Horizontal Roll   Gaze Exercises X1 Viewing Horizontal    EPLEY MANUEVER LEFT   Number of Reps  1   [employer]   Number of Reps  --  3   Horizontal Roll   Number of Reps  3   X1 Viewing Horizontal   Foot Position seated   Time --  30 seconds   Reps 2   Comments horizontal and vertical directions (no glasses up/down plane)               PT Education - 08/19/14 1410    Education provided Yes   Education Details HEP: habituation horizontal roll, habituation sit<>sidelying, gaze adaptation x 1 viewing   Person(s) Educated Patient   Methods Explanation;Demonstration;Handout   Comprehension Verbalized understanding;Returned demonstration          PT Short Term Goals - 07/17/14 4628    PT SHORT TERM GOAL #1   Title The patient will return demo HEP for habituation and gaze adaptation.   Baseline Target date 08/14/2014   Time 4   Period Weeks   PT SHORT TERM GOAL #2   Title The patient will report dizziness improved from 6/10 down to < or equal to 4/10 with sit<>bilateral sidelying.   Baseline target date 08/14/2014   Time 4   Period Weeks   PT SHORT TERM GOAL #3   Title The patient will report dizziness improved with rolling in bed from 6/10 down to< or equal to 4/10 with rolling supine<>bilateral sidelying.    Baseline Target date 08/14/2014   Time 4   Period Weeks   PT SHORT TERM GOAL #4   Title The patient will tolerate gaze x 1 viewing x 30 seconds for improved motion tolerance (tolerates 6 reps at eval).   Baseline Target date 08/14/2014   Time 4   Period Weeks           PT Long Term Goals - 08/19/14 1412    PT LONG TERM GOAL #1   Title The patient will be indep with progression of HEP for post d/c activities.   Baseline Target date 09/13/2014   Time 8   Period Weeks   PT LONG TERM GOAL #2   Title Further PT  assessment as indicated for gait speed and goal to follow if indicated.   Baseline Target date 09/13/2014   Time 8   Period Weeks   PT LONG TERM GOAL #3   Title Further PT assessment as indicated for Berg balance scale.   Baseline Target date 09/13/2014-- Met goals on 08/19/2014 with patient scoring  34/56 on Berg.   Time 8   Period Weeks   Status Achieved   PT LONG TERM GOAL #4   Title Improve Berg from 34/56 up to > or equal to 40/56.    Baseline Target date 09/13/2014   Time 8   Period Weeks               Plan - 08/19/14 1414    Clinical Impression Statement Although patient improving with dizziness with HEP, she still reports 4 days of significant vertigo in which she stayed in bed all day.  PT progressed HEP.  Patient scores 34/56 indicating high fall risk.  PT to incorporate balance training into therapy to progress functional mobility and safety.   Pt will benefit from skilled therapeutic intervention in order to improve on the following deficits Abnormal gait;Decreased balance;Dizziness;Decreased mobility;Decreased activity tolerance;Postural dysfunction   Rehab Potential Good   PT Frequency 2x / week   PT Duration 8 weeks  Decreasing to 1x/week after initial 4 weeks   PT Treatment/Interventions Vestibular;Gait training;Neuromuscular re-education;Therapeutic exercise;Balance training;Therapeutic activities;Functional mobility training;Stair training   PT Next Visit Plan Check HEP, check gait speed, progress to adding balance balance to HEP   Consulted and Agree with Plan of Care Patient        Problem List Patient Active Problem List   Diagnosis Date Noted  . Chest pain at rest 10/30/2013  . Exertional dyspnea 10/30/2013  . Hyperlipidemia 10/30/2013  . Morbid obesity 10/30/2013  . Acquired autoimmune hypothyroidism 09/02/2013  . Type II or unspecified type diabetes mellitus without mention of complication, uncontrolled 06/14/2013  . Essential hypertension, benign  06/14/2013  . Other and unspecified hyperlipidemia 06/14/2013    Maziyah Vessel, PT 08/19/2014, 2:27 PM  [place] [address] [place], [place], [address] Phone: [phone]   Fax:  [phone]

## 2014-08-19 NOTE — Patient Instructions (Signed)
Tip Card 1.The goal of habituation training is to assist in decreasing symptoms of vertigo, dizziness, or nausea provoked by specific head and body motions. 2.These exercises may initially increase symptoms; however, be persistent and work through symptoms. With repetition and time, the exercises will assist in reducing or eliminating symptoms. 3.Exercises should be stopped and discussed with the therapist if you experience any of the following: - Sudden change or fluctuation in hearing - New onset of ringing in the ears, or increase in current intensity - Any fluid discharge from the ear - Severe pain in neck or back - Extreme nausea  Copyright  VHI. All rights reserved.  Rolling   With pillow under head, start on back. Roll to your right side.  Hold until dizziness stops, plus 20 seconds and then roll to the left side.  Hold until dizziness stops, plus 20 seconds.  Repeat sequence 5 times per session. Do 2 sessions per day.  Copyright  VHI. All rights reserved.  Sit to Side-Lying   Sit on edge of bed. Lie down onto the right side and hold until dizziness stops, plus 20 seconds.  Return to sitting and wait until dizziness stops, plus 20 seconds.  Repeat to the left side. Repeat sequence 5 times per session. Do 2 sessions per day.  Copyright  VHI. All rights reserved.  Gaze Stabilization: Tip Card 1.Target must remain in focus, not blurry, and appear stationary while head is in motion. 2.Perform exercises with small head movements (45 to either side of midline). 3.Increase speed of head motion so long as target is in focus. 4.If you wear eyeglasses, be sure you can see target through lens (therapist will give specific instructions for bifocal / progressive lenses). 5.These exercises may provoke dizziness or nausea. Work through these symptoms. If too dizzy, slow head movement slightly. Rest between each exercise. 6.Exercises demand concentration; avoid distractions. 7.For safety,  perform standing exercises close to a counter, wall, corner, or next to someone.  Copyright  VHI. All rights reserved.  Gaze Stabilization: Standing Feet Apart   *Begin in seated position for safety, keeping eyes on target on wall 3 feet away, tilt head down slightly and move head side to side for 30 seconds. Repeat while moving head up and down for 30 seconds. Do 2 sessions per day.   Copyright  VHI. All rights reserved.

## 2014-08-21 ENCOUNTER — Ambulatory Visit (INDEPENDENT_AMBULATORY_CARE_PROVIDER_SITE_OTHER): Payer: Medicare Other | Admitting: Neurology

## 2014-08-21 ENCOUNTER — Encounter: Payer: Self-pay | Admitting: Neurology

## 2014-08-21 VITALS — BP 129/75 | HR 87 | Ht 63.0 in | Wt 228.0 lb

## 2014-08-21 DIAGNOSIS — R42 Dizziness and giddiness: Secondary | ICD-10-CM

## 2014-08-21 DIAGNOSIS — R2681 Unsteadiness on feet: Secondary | ICD-10-CM | POA: Diagnosis not present

## 2014-08-21 LAB — BASIC METABOLIC PANEL: CREATININE: 0.9 mg/dL (ref 0.5–1.1)

## 2014-08-21 LAB — LIPID PANEL: LDL CALC: 101 mg/dL

## 2014-08-21 LAB — HEMOGLOBIN A1C: Hgb A1c MFr Bld: 7 % — AB (ref 4.0–6.0)

## 2014-08-21 LAB — TSH: TSH: 1.9 u[IU]/mL (ref 0.41–5.90)

## 2014-08-21 NOTE — Progress Notes (Signed)
PATIENT: [name] DOB: 08-30-1940  Chief Complaint  Patient presents with  . Dizziness    She has been having problems with vertigo for years.  Recently, it has become much worse.  She has had a normal CT head scan.  She is currently in vestbular rehab twice weekly.  Her othostatic BP readings are as follows:  Lying 129/75, 87 - Sitting  135/76, 88 - Standing 138/78, 93.     HISTORICAL  [name] is a 74 years old left-handed female, seen in refer by her primary care physician from [facility] Dr. Nicholos Johns, Campbell Lerner for valuation of dizziness  I have reviewed and summarized her most recent office visit in June 21 2014  She had a past medical history of hypertension, diabetes, bipolar disorder, TIA symptoms in 2012, but negative workup including MRI of the brain, EEG, hyperlipidemia, retinal detachment, obstructive sleep apnea, on CPAP machine, mild memory trouble,  Laboratory evaluation in July 2016, A1c was 6.6, LDL was 75, normal CMP, with exception of mild elevated glucose,  She complains of intermittent dizziness spells since 2013, she used to have about one spell each months, progressively worse, especially since April 2016, she has more frequent more pronounced spells, now she has 2 or 3 spells each months, lasting up to 3 days  She complains of spinning sensation, unsteady gait, mild nausea, blurry vision, difficulty focusing, usually started when she first woke up from overnight sleep, there was no associated headaches, she denies ringing in the ear, no hearing loss. Her dizziness getting worse by sudden positional change, such as bending over   She also has long-standing history of bilateral hands tremor, head titubation, her mother has similar tremorShe also reported a long-standing history of migraine, her typical migraine are severe pounding headache with associated light noise sensitivity, nauseous, but she has not had her typical migraine  for many years,  I also reviewed her MRI of the brain 2012, mild atrophy, small vessel disease,  CAT scan of the brain June second 2016, mild atrophy, notes acute disease MRA of brain in 2012, no evidence of hemodynamic significant stenosis involving bilateral internal carotid artery, moderate tendon stenosis of proximal left vertebral artery, mild to moderate smooth narrowing of the right vertebral artery MRI of lumbar in 2012, mild degenerative disc disease, no significant foraminal canal stenosis.  REVIEW OF SYSTEMS: Full 14 system review of systems performed and notable only for Fatigue, swelling legs, ringing ears, spinning sensation, blurred vision, wheezing, snoring, incontinence, diarrhea, constipation, urination problems, incontinence, not in urine, allergies, runny nose, skin sensitivity, weakness, dizziness, insomnia, depression, not enough sleep, decreased energy, disinteresting activities  ALLERGIES: Allergies  Allergen Reactions  . Flagyl [Metronidazole Hcl] Itching and Rash  . Metformin And Related Rash  . Milk-Related Compounds   . Peanuts [Peanut Oil]     [place] NUTS ONLY  . Ciprofloxacin Itching and Rash    HOME MEDICATIONS: Current Outpatient Prescriptions  Medication Sig Dispense Refill  . aspirin EC 81 MG tablet Take 162 mg by mouth daily.     . bisoprolol (ZEBETA) 5 MG tablet Take 10 mg by mouth 2 (two) times daily.     . carbamazepine (CARBATROL) 200 MG 12 hr capsule Take 200 mg by mouth 2 (two) times daily.     . Cholecalciferol (VITAMIN D) 2000 UNITS tablet Take 2,000 Units by mouth daily.    Marland Kitchen dexlansoprazole (DEXILANT) 60 MG capsule Take 60 mg by mouth daily.    Marland Kitchen  donepezil (ARICEPT) 10 MG tablet Take 10 mg by mouth at bedtime.    . fesoterodine (TOVIAZ) 8 MG TB24 Take 8 mg by mouth daily.    . folic acid (FOLVITE) 1 MG tablet Take 1 mg by mouth daily.    Marland Kitchen gabapentin (NEURONTIN) 300 MG capsule Take 1 capsule (300 mg total) by mouth 3 (three) times daily. 90  capsule 3  . glucose blood (FREESTYLE TEST STRIPS) test strip Use 5 times per day 100 each 12  . hydrALAZINE (APRESOLINE) 25 MG tablet TAKE 1 TABLET (25 MG TOTAL) BY MOUTH 3 (THREE) TIMES DAILY. 90 tablet 0  . hydrALAZINE (APRESOLINE) 25 MG tablet Take 1 tablet (25 mg total) by mouth 3 (three) times daily. <PLEASE MAKE APPOINTMENT FOR REFILLS> 90 tablet 0  . hydrOXYzine (ATARAX/VISTARIL) 50 MG tablet Take 50 mg by mouth 3 (three) times daily as needed.    . insulin aspart (NOVOLOG FLEXPEN) 100 UNIT/ML FlexPen INJECT 10 UNITS BEFORE BREAKFAST, LUNCH AND DINNER EVERY DAY. 15 mL 2  . Insulin Detemir (LEVEMIR FLEXTOUCH [place]) Inject 30-35 Units into the skin 2 (two) times daily. Takes 30 units in the morning and 35 units at night    . Insulin Pen Needle 32G X 4 MM MISC Use 8 pen needles per day 250 each 2  . Lancets (FREESTYLE) lancets Use 5/day 100 each 12  . levothyroxine (SYNTHROID, LEVOTHROID) 75 MCG tablet Take 75 mcg by mouth daily.      . Liraglutide (VICTOZA) 18 MG/3ML SOPN (1.2 MG) DAILY AS DIRECTED 6 mL 3  . lurasidone (LATUDA) 80 MG TABS tablet Take 160 mg by mouth daily with breakfast.    . meclizine (ANTIVERT) 25 MG tablet Take 1 tablet (25 mg total) by mouth 3 (three) times daily as needed. 30 tablet 0  . methotrexate (RHEUMATREX) 2.5 MG tablet Take 7.5 mg by mouth once a week. Pt takes 1 tablet once a week    . nitroGLYCERIN (NITROSTAT) 0.4 MG SL tablet Place 0.4 mg under the tongue every 5 (five) minutes as needed.    Marland Kitchen NOVOLOG FLEXPEN 100 UNIT/ML FlexPen INJECT 10 UNITS BEFORE BREAKFAST, LUNCH AND DINNER EVERY DAY. 15 mL 2  . OLANZapine (ZYPREXA) 5 MG tablet Take 5 mg by mouth at bedtime.    . Olmesartan-Amlodipine-HCTZ 40-10-12.5 MG TABS Take 1 tablet by mouth daily. 30 tablet 6  . ondansetron (ZOFRAN) 8 MG tablet Take 1 tablet (8 mg total) by mouth every 8 (eight) hours as needed for nausea. 9 tablet 0  . ranitidine (ZANTAC) 150 MG capsule Take 150 mg by mouth every evening.         PAST MEDICAL HISTORY: Past Medical History  Diagnosis Date  . Thyroid disease   . Hypertension   . Ischemic colitis, enteritis, or enterocolitis   . Sleep apnea   . Urinary bladder incontinence   . GERD (gastroesophageal reflux disease)   . Hyperlipidemia   . Diabetes mellitus     diet controlled  . Stroke     x's 2  . Depression     Bipolar  . Vertigo     PAST SURGICAL HISTORY: Past Surgical History  Procedure Laterality Date  . Retinal detachment surgery    . Appendectomy    . Tonsillectomy    . Hemorrhoid surgery    . Cholecystectomy    . Abdominal hysterectomy      partial     FAMILY HISTORY: Family History  Problem Relation Age of Onset  .  Heart disease Mother   . Stroke Father   . Cancer Sister   . Cancer Brother   . Parkinson's disease Father     SOCIAL HISTORY:  History   Social History  . Marital Status: Widowed    Spouse Name: N/A  . Number of Children: 2  . Years of Education: 16   Occupational History  . Retired    Social History Main Topics  . Smoking status: Never Smoker   . Smokeless tobacco: Never Used  . Alcohol Use: No  . Drug Use: No  . Sexual Activity: Not on file   Other Topics Concern  . Not on file   Social History Narrative   Lives at home alone.   Right-handed.   No caffeine use.     PHYSICAL EXAM   Filed Vitals:   08/21/14 1314  BP: 129/75  Pulse: 87  Height: 5\' 3"  (1.6 m)  Weight: 228 lb (103.42 kg)    Not recorded      Body mass index is 40.4 kg/(m^2).  PHYSICAL EXAMNIATION:  Gen: NAD, conversant, well nourised, obese, well groomed                     Cardiovascular: Regular rate rhythm, no peripheral edema, warm, nontender. Eyes: Conjunctivae clear without exudates or hemorrhage Neck: Supple, no carotid bruise. Pulmonary: Clear to auscultation bilaterally   NEUROLOGICAL EXAM:  MENTAL STATUS: Speech:    Speech is normal; fluent and spontaneous with normal comprehension.  Cognition:      Orientation to time, place and person     Normal recent and remote memory     Normal Attention span and concentration     Normal Language, naming, repeating,spontaneous speech     Fund of knowledge   CRANIAL NERVES: CN II: Visual fields are full to confrontation. Fundoscopic exam is normal with sharp discs and no vascular changes. Pupils are round equal and briskly reactive to light. CN III, IV, VI: extraocular movement are normal. No ptosis. CN V: Facial sensation is intact to pinprick in all 3 divisions bilaterally. Corneal responses are intact.  CN VII: Face is symmetric with normal eye closure and smile. CN VIII: Hearing is normal to rubbing fingers CN IX, X: Palate elevates symmetrically. Phonation is normal. CN XI: Head turning and shoulder shrug are intact CN XII: Tongue is midline with normal movements and no atrophy.  MOTOR: She has bilateral hands postural tremor, head titubation. Muscle bulk and tone are normal. Muscle strength is normal.  REFLEXES: Reflexes are 2+ and symmetric at the biceps, triceps, knees, and ankles. Plantar responses are flexor.  SENSORY: Intact to light touch, pinprick, position sense, and vibration sense are intact in fingers and toes.  COORDINATION: Rapid alternating movements and fine finger movements are intact. There is no dysmetria on finger-to-nose and heel-knee-shin.    GAIT/STANCE: Posture is normal. Gait is steady with normal steps, base, arm swing, and turning. Heel and toe walking are normal. Tandem gait is normal.  Romberg is absent.   DIAGNOSTIC DATA (LABS, IMAGING, TESTING) - I reviewed patient records, labs, notes, testing and imaging myself where available.  ASSESSMENT AND PLAN  [name] is a 74 y.o. female    Dizziness spells   Differentiation diagnosis includes migraine variants, medicine side effect, also posterior circulation insufficiency  She does have multiple vascular risk factors, insulin-dependent  diabetes, hypertension, hyperlipidemia,  Proceed with MRI of brain, MRA of brain  Keep vestibular rehabilitation schedule Essential  tremor:  May consider add on beta blocker    Orders Placed This Encounter  Procedures  . MR Brain W Wo Contrast  . MR Angiogram Neck Wo Contrast    Return in about 2 months (around 10/21/2014).   Levert Feinstein, M.D. Ph.D.  [facility] [address] [place], [place] [address] Ph: [phone] Fax: [phone]  CC: To referring provider

## 2014-08-22 ENCOUNTER — Ambulatory Visit: Payer: Medicare Other | Admitting: Rehabilitative and Restorative Service Providers"

## 2014-08-22 DIAGNOSIS — H8112 Benign paroxysmal vertigo, left ear: Secondary | ICD-10-CM | POA: Diagnosis not present

## 2014-08-22 DIAGNOSIS — R269 Unspecified abnormalities of gait and mobility: Secondary | ICD-10-CM

## 2014-08-22 NOTE — Patient Instructions (Addendum)
Feet Apart, Head Motion - Eyes Open   With eyes open, feet apart, move head slowly: side to side 5 times, then up and down 5 times.  REST and allow to symptoms to settle. Do __1-2__ sessions per day.  Copyright  VHI. All rights reserved.  Feet Apart, Varied Arm Positions - Eyes Closed   Stand with feet shoulder width apart and arms at your side. Close eyes and visualize upright position. Hold __30__ seconds. Repeat __3__ times per session. Do _1-2___ sessions per day.  Copyright  VHI. All rights reserved.    MODIFY THE "E" EXERCISE:  Do smaller head movement Begin with 10 times side to side Rest after to allow symptoms to settle.  Walk to the mail box 2 times per day, as weather allows.

## 2014-08-22 NOTE — Therapy (Signed)
[facility] [address] [place], [place], [address] Phone: [phone]   Fax:  [phone]  Physical Therapy Treatment  Patient Details  Name: [name] MRN: [id number] Date of Birth: March 13, 1940 Referring Provider:  Tommy Medal, MD  Encounter Date: 08/22/2014      PT End of Session - 08/22/14 1129    Visit Number 3   Number of Visits 12   Date for PT Re-Evaluation 09/13/14   Authorization Type G code every 10th visit   PT Start Time 1100   PT Stop Time 1148   PT Time Calculation (min) 48 min   Equipment Utilized During Treatment Gait belt   Activity Tolerance Patient tolerated treatment well   Behavior During Therapy [facility] for tasks assessed/performed      Past Medical History  Diagnosis Date  . Thyroid disease   . Hypertension   . Ischemic colitis, enteritis, or enterocolitis   . Sleep apnea   . Urinary bladder incontinence   . GERD (gastroesophageal reflux disease)   . Hyperlipidemia   . Diabetes mellitus     diet controlled  . Stroke     x's 2  . Depression     Bipolar  . Vertigo     Past Surgical History  Procedure Laterality Date  . Retinal detachment surgery    . Appendectomy    . Tonsillectomy    . Hemorrhoid surgery    . Fracture surgery    . Cholecystectomy    . Abdominal hysterectomy      partial     There were no vitals filed for this visit.  Visit Diagnosis:  Benign paroxysmal positional vertigo, left  Abnormality of gait      Subjective Assessment - 08/22/14 1100    Subjective The patient reports "I did the exercises".  The letter exercise provokes symptoms of hard to focus and dizziness.  The other HEP did not provoke.  Neurology appt went well and patient is to have f/u MRI and MRA--awaiting scheduling.   Patient Stated Goals reduce dizziness, improve mobility   Currently in Pain? No/denies      Gait: 44M=11.29 seconds=2.91 ft/sec Ambulation without device  230 ft with general "whooziness" and lightheaded sensation. Gait x 230 feet x 3 reps with tactile cues emphasizing arm swing, longer stride and greater hip extension  NEUROMUSCULAR RE-EDUCATION: Corner balance exercises consisting of: Standing on level surface with feet apart working towards feet together with eyes open + head turns, then eyes closed with head stationery with CGA for safety. Tried partial heel toe as well Sit<>stand x 10 reps Gaze x 1 viewing seated with modifications made for HEP tolerance (see patient instructions)       PT Education - 08/22/14 1703    Education provided Yes   Education Details HEP: feet apart + eyes closed, feet apart + head turns, walking program, modified gaze x 1 viewing to be smaller and only 10 reps   Person(s) Educated Patient   Methods Explanation;Demonstration;Handout   Comprehension Returned demonstration;Verbalized understanding          PT Short Term Goals - 07/17/14 0952    PT SHORT TERM GOAL #1   Title The patient will return demo HEP for habituation and gaze adaptation.   Baseline Target date 08/14/2014   Time 4   Period Weeks   PT SHORT TERM GOAL #2   Title The patient will report dizziness improved from 6/10 down to < or equal to  4/10 with sit<>bilateral sidelying.   Baseline target date 08/14/2014   Time 4   Period Weeks   PT SHORT TERM GOAL #3   Title The patient will report dizziness improved with rolling in bed from 6/10 down to< or equal to 4/10 with rolling supine<>bilateral sidelying.    Baseline Target date 08/14/2014   Time 4   Period Weeks   PT SHORT TERM GOAL #4   Title The patient will tolerate gaze x 1 viewing x 30 seconds for improved motion tolerance (tolerates 6 reps at eval).   Baseline Target date 08/14/2014   Time 4   Period Weeks    * goals not addressed due to third visit since eval b/c of patient travelling, plan to update next visit.       PT Long Term Goals - 08/19/14 1412    PT LONG TERM GOAL  #1   Title The patient will be indep with progression of HEP for post d/c activities.   Baseline Target date 09/13/2014   Time 8   Period Weeks   PT LONG TERM GOAL #2   Title Further PT assessment as indicated for gait speed and goal to follow if indicated.   Baseline Target date 09/13/2014   Time 8   Period Weeks   PT LONG TERM GOAL #3   Title Further PT assessment as indicated for Berg balance scale.   Baseline Target date 09/13/2014-- Met goals on 08/19/2014 with patient scoring 34/56 on Berg.   Time 8   Period Weeks   Status Achieved   PT LONG TERM GOAL #4   Title Improve Berg from 34/56 up to > or equal to 40/56.    Baseline Target date 09/13/2014   Time 8   Period Weeks               Plan - 08/22/14 1705    Clinical Impression Statement The patient has increased symptoms today with horizontal and vertical head turns. PT added HEP with instructionon how to modify to improve compliance and tolerance to movement.   PT Next Visit Plan Check HEP, balance + gait training, habituation as needed  *Check STGs   Consulted and Agree with Plan of Care Patient        Problem List Patient Active Problem List   Diagnosis Date Noted  . Chest pain at rest 10/30/2013  . Exertional dyspnea 10/30/2013  . Hyperlipidemia 10/30/2013  . Morbid obesity 10/30/2013  . Acquired autoimmune hypothyroidism 09/02/2013  . Type II or unspecified type diabetes mellitus without mention of complication, uncontrolled 06/14/2013  . Essential hypertension, benign 06/14/2013  . Other and unspecified hyperlipidemia 06/14/2013    Taya Ashbaugh, PT 08/22/2014, 5:09 PM  [facility] [address] [place], [place], [address] Phone: [phone]   Fax:  [phone]

## 2014-08-26 ENCOUNTER — Ambulatory Visit: Payer: Medicare Other | Admitting: Rehabilitative and Restorative Service Providers"

## 2014-08-26 DIAGNOSIS — H8112 Benign paroxysmal vertigo, left ear: Secondary | ICD-10-CM | POA: Diagnosis not present

## 2014-08-26 DIAGNOSIS — R269 Unspecified abnormalities of gait and mobility: Secondary | ICD-10-CM

## 2014-08-26 NOTE — Patient Instructions (Signed)

## 2014-08-26 NOTE — Therapy (Signed)
[facility] [address] [place], [place], [address] Phone: [phone]   Fax:  [phone]  Physical Therapy Treatment  Patient Details  Name: [name] MRN: [id number] Date of Birth: 10/22/1940 Referring Provider:  Tommy Medal, MD  Encounter Date: 08/26/2014      PT End of Session - 08/26/14 1301    Visit Number 4   Number of Visits 12   Date for PT Re-Evaluation 09/13/14   Authorization Type G code every 10th visit   PT Start Time 1019   PT Stop Time 1100   PT Time Calculation (min) 41 min   Equipment Utilized During Treatment Gait belt   Activity Tolerance Patient tolerated treatment well   Behavior During Therapy [facility] for tasks assessed/performed      Past Medical History  Diagnosis Date  . Thyroid disease   . Hypertension   . Ischemic colitis, enteritis, or enterocolitis   . Sleep apnea   . Urinary bladder incontinence   . GERD (gastroesophageal reflux disease)   . Hyperlipidemia   . Diabetes mellitus     diet controlled  . Stroke     x's 2  . Depression     Bipolar  . Vertigo     Past Surgical History  Procedure Laterality Date  . Retinal detachment surgery    . Appendectomy    . Tonsillectomy    . Hemorrhoid surgery    . Fracture surgery    . Cholecystectomy    . Abdominal hysterectomy      partial     There were no vitals filed for this visit.  Visit Diagnosis:  Abnormality of gait  Benign paroxysmal positional vertigo, left      Subjective Assessment - 08/26/14 1014    Subjective The patient reports she is doing new HEP and walking more (2 times/day in the house for about 5+ minutes each time).  She notes no dizziness since last PT session.     Patient Stated Goals reduce dizziness, improve mobility   Currently in Pain? No/denies     "I'm not going to fall, but I have unsteady gait."   Gait: Ambulation with emphasis on longer stride length and arm swing with  tactile cues for correct sequencing x 345 feet and then patient requested rest break due to fatigue. Dynamic gait activities walking on level surfaces with min A  x 200 feet performing direction changes, 180 degree turns, marching, forward/backwards walking.  NEUROMUSCULAR RE-EDUCATION: Rocker board standing with head turns, reaching activities and eyes closed with min A  Standing weight shifting anterior/posterior while reaching with UEs for greater trunk rotation with min A and report of increasing unsteadiness and tremors with task.  Rested x 8 minutes to allow sensation to settle while discussing below self mgmt.  SELF CARE/HOME MANAGEMENT: Discussed home walking, fall prevention, and variable conditions with tremors and dizziness.       PT Short Term Goals - 08/26/14 1027    PT SHORT TERM GOAL #1   Title The patient will return demo HEP for habituation and gaze adaptation.   Baseline met on 08/26/2014   Time 4   Period Weeks   Status Achieved   PT SHORT TERM GOAL #2   Title The patient will report dizziness improved from 6/10 down to < or equal to 4/10 with sit<>bilateral sidelying.   Baseline Met on 08/26/2014   Time 4   Period Weeks   Status Achieved   PT  SHORT TERM GOAL #3   Title The patient will report dizziness improved with rolling in bed from 6/10 down to< or equal to 4/10 with rolling supine<>bilateral sidelying.    Baseline Met on 08/26/2014   Time 4   Period Weeks   Status Achieved   PT SHORT TERM GOAL #4   Title The patient will tolerate gaze x 1 viewing x 30 seconds for improved motion tolerance (tolerates 6 reps at eval).   Baseline Met on 08/26/2014 with dizziness 3/10, but able to complete 30 seconds.   Time 4   Period Weeks   Status Achieved           PT Long Term Goals - 08/26/14 1029    PT LONG TERM GOAL #1   Title The patient will be indep with progression of HEP for post d/c activities.   Baseline Target date 09/13/2014   Time 8   Period Weeks    Status On-going   PT LONG TERM GOAL #2   Title Further PT assessment as indicated for gait speed and goal to follow if indicated.   Baseline Met on 08/22/2014 with patient scoring 2.91 ft/sec indicating "full community ambulator" classification of gait.   Time 8   Period Weeks   Status Achieved   PT LONG TERM GOAL #3   Title Further PT assessment as indicated for Berg balance scale.   Baseline Target date 09/13/2014-- Met goals on 08/19/2014 with patient scoring 34/56 on Berg.   Time 8   Period Weeks   Status Achieved   PT LONG TERM GOAL #4   Title Improve Berg from 34/56 up to > or equal to 40/56.    Baseline Target date 09/13/2014   Time 8   Period Weeks   Status On-going               Plan - 08/26/14 1301    Clinical Impression Statement The patient met STGs and 2 LTGs.  PT to continue to progress balance activities and HEP to patient tolerance.   PT Next Visit Plan Check HEP, balance + gait training, habituation as needed    Consulted and Agree with Plan of Care Patient        Problem List Patient Active Problem List   Diagnosis Date Noted  . Chest pain at rest 10/30/2013  . Exertional dyspnea 10/30/2013  . Hyperlipidemia 10/30/2013  . Morbid obesity 10/30/2013  . Acquired autoimmune hypothyroidism 09/02/2013  . Type II or unspecified type diabetes mellitus without mention of complication, uncontrolled 06/14/2013  . Essential hypertension, benign 06/14/2013  . Other and unspecified hyperlipidemia 06/14/2013    Mikhael Hendriks, PT 08/26/2014, 1:17 PM  [facility] [address] [place], [place], [address] Phone: [phone]   Fax:  [phone]

## 2014-08-28 ENCOUNTER — Ambulatory Visit: Payer: Medicare Other | Admitting: Rehabilitative and Restorative Service Providers"

## 2014-08-28 DIAGNOSIS — H8112 Benign paroxysmal vertigo, left ear: Secondary | ICD-10-CM | POA: Diagnosis not present

## 2014-08-28 DIAGNOSIS — R269 Unspecified abnormalities of gait and mobility: Secondary | ICD-10-CM

## 2014-08-28 NOTE — Therapy (Signed)
[facility] [address] [place], [place], [address] Phone: [phone]   Fax:  [phone]  Physical Therapy Treatment  Patient Details  Name: [name] MRN: [id number] Date of Birth: 03-03-1940 Referring Provider:  Tommy Medal, MD  Encounter Date: 08/28/2014      PT End of Session - 08/28/14 1021    Visit Number 5   Number of Visits 12   Date for PT Re-Evaluation 09/13/14   Authorization Type G code every 10th visit   PT Start Time 1018   PT Stop Time 1100   PT Time Calculation (min) 42 min   Equipment Utilized During Treatment Gait belt   Activity Tolerance Patient tolerated treatment well   Behavior During Therapy [facility] for tasks assessed/performed      Past Medical History  Diagnosis Date  . Thyroid disease   . Hypertension   . Ischemic colitis, enteritis, or enterocolitis   . Sleep apnea   . Urinary bladder incontinence   . GERD (gastroesophageal reflux disease)   . Hyperlipidemia   . Diabetes mellitus     diet controlled  . Stroke     x's 2  . Depression     Bipolar  . Vertigo     Past Surgical History  Procedure Laterality Date  . Retinal detachment surgery    . Appendectomy    . Tonsillectomy    . Hemorrhoid surgery    . Fracture surgery    . Cholecystectomy    . Abdominal hysterectomy      partial     There were no vitals filed for this visit.  Visit Diagnosis:  Abnormality of gait  Benign paroxysmal positional vertigo, left      Subjective Assessment - 08/28/14 1019    Subjective Patient without episodes of dizziness.  She read through printout on fall prevention and has no questions.  She notes that since changing depression meds to take at night, she feels dizziness less.   Patient Stated Goals reduce dizziness, improve mobility   Currently in Pain? No/denies     Gait: The patient ambulates independently on level surfaces with cues for arm swing x 230 feet x 3  reps  NEUROMUSCULAR RE-EDUCATION: Balance exercises performing side step to middle and rock and reach with anterior/posterior weight shifting with UE reaching Berg=41/56 180 degree turns to the left with 5 reps for habituation Corner balance exercises consisting of: Standing on level surface with feet apart moving closer together with eyes open and closed with CGA for safety.        [facility] PT Assessment - 08/28/14 1032    Standardized Balance Assessment   Standardized Balance Assessment Berg Balance Test   Berg Balance Test   Sit to Stand Able to stand  independently using hands   Standing Unsupported Able to stand safely 2 minutes   Sitting with Back Unsupported but Feet Supported on Floor or Stool Able to sit safely and securely 2 minutes   Stand to Sit Sits safely with minimal use of hands   Transfers Able to transfer safely, definite need of hands   Standing Unsupported with Eyes Closed Able to stand 10 seconds with supervision   Standing Ubsupported with Feet Together Able to place feet together independently but unable to hold for 30 seconds   From Standing, Reach Forward with Outstretched Arm Can reach forward >12 cm safely (5")   From Standing Position, Pick up Object from Floor Able to pick up shoe,  needs supervision  no dizziness   From Standing Position, Turn to Look Behind Over each Shoulder Looks behind from both sides and weight shifts well   Turn 360 Degrees Able to turn 360 degrees safely but slowly  turning Left only provokes 3-4/10 dizziness   Standing Unsupported, Alternately Place Feet on Step/Stool Able to complete 4 steps without aid or supervision   Standing Unsupported, One Foot in Front Able to plae foot ahead of the other independently and hold 30 seconds   Standing on One Leg Tries to lift leg/unable to hold 3 seconds but remains standing independently   Total Score 41   High Level Balance   High Level Balance Comments 41/56 improving 7 points since eval            PT Education - 08/28/14 1056    Education provided Yes   Education Details HEP: 180 degree turns, feet together eyes open, feet together eyes closed   Person(s) Educated Patient   Methods Explanation;Demonstration;Handout   Comprehension Verbalized understanding;Returned demonstration          PT Short Term Goals - 08/26/14 1027    PT SHORT TERM GOAL #1   Title The patient will return demo HEP for habituation and gaze adaptation.   Baseline met on 08/26/2014   Time 4   Period Weeks   Status Achieved   PT SHORT TERM GOAL #2   Title The patient will report dizziness improved from 6/10 down to < or equal to 4/10 with sit<>bilateral sidelying.   Baseline Met on 08/26/2014   Time 4   Period Weeks   Status Achieved   PT SHORT TERM GOAL #3   Title The patient will report dizziness improved with rolling in bed from 6/10 down to< or equal to 4/10 with rolling supine<>bilateral sidelying.    Baseline Met on 08/26/2014   Time 4   Period Weeks   Status Achieved   PT SHORT TERM GOAL #4   Title The patient will tolerate gaze x 1 viewing x 30 seconds for improved motion tolerance (tolerates 6 reps at eval).   Baseline Met on 08/26/2014 with dizziness 3/10, but able to complete 30 seconds.   Time 4   Period Weeks   Status Achieved           PT Long Term Goals - 08/28/14 1411    PT LONG TERM GOAL #1   Title The patient will be indep with progression of HEP for post d/c activities.   Baseline Target date 09/13/2014   Time 8   Period Weeks   Status On-going   PT LONG TERM GOAL #2   Title Further PT assessment as indicated for gait speed and goal to follow if indicated.   Baseline Met on 08/22/2014 with patient scoring 2.91 ft/sec indicating "full community ambulator" classification of gait.   Time 8   Period Weeks   Status Achieved   PT LONG TERM GOAL #3   Title Further PT assessment as indicated for Berg balance scale.   Baseline Target date 09/13/2014-- Met goals on 08/19/2014  with patient scoring 34/56 on Berg.   Time 8   Period Weeks   Status Achieved   PT LONG TERM GOAL #4   Title Improve Berg from 34/56 up to > or equal to 40/56.    Baseline Met on 8/102/106 with patient improving to 41/56.   Time 8   Period Weeks   Status Achieved  Plan - 08/28/14 1411    Clinical Impression Statement The patient met LTG for Berg score.  She has decreased dizziness and therefore, has been more mobile in her home.  There are many barriers to patient participating in community exercise- she reports not enjoying walking and not liking a gym.  She is willing to try AHOY class beginning in September at the [facility]. PT to provide resources.   PT Next Visit Plan Check HEP, balance + gait training, habituation as needed .  Provide AHOY schedule for [facility] at discharge.  Begin d/c planning.   Consulted and Agree with Plan of Care Patient        Problem List Patient Active Problem List   Diagnosis Date Noted  . Chest pain at rest 10/30/2013  . Exertional dyspnea 10/30/2013  . Hyperlipidemia 10/30/2013  . Morbid obesity 10/30/2013  . Acquired autoimmune hypothyroidism 09/02/2013  . Type II or unspecified type diabetes mellitus without mention of complication, uncontrolled 06/14/2013  . Essential hypertension, benign 06/14/2013  . Other and unspecified hyperlipidemia 06/14/2013    Lukus Binion, PT 08/28/2014, 2:13 PM  [facility] [address] [place], [place], [address] Phone: [phone]   Fax:  [phone]

## 2014-08-28 NOTE — Patient Instructions (Signed)
Feet Together, Varied Arm Positions - Eyes Open   With eyes open, feet together, arms at your side, look straight ahead at a stationary object. Hold __30__ seconds.  Rest holding onto wall and repeat 1 more time. Do _2___ sessions per day.  Copyright  VHI. All rights reserved.  Feet Apart, Varied Arm Positions - Eyes Closed   Stand with feet shoulder width apart and arms at your side. Close eyes and visualize upright position. Hold _10___ seconds. Repeat __3__ times per session. Do _2___ sessions per day.  Copyright  VHI. All rights reserved.  Turning   Slowly make 1/2 turn to left with eyes open. Hold position until symptoms stop.  Turn back to the right. Repeat _5___ times per session. Do __2__ sessions per day.  Copyright  VHI. All rights reserved.  Prospero Mahnke, PT [facility] [address] [place], [place]  [address] Phone:  [phone] Fax:  [phone]

## 2014-08-31 ENCOUNTER — Emergency Department (HOSPITAL_COMMUNITY): Payer: Medicare Other

## 2014-08-31 ENCOUNTER — Emergency Department (HOSPITAL_COMMUNITY)
Admission: EM | Admit: 2014-08-31 | Discharge: 2014-08-31 | Disposition: A | Discharge status: Home / Self Care | Payer: Medicare Other | Attending: Emergency Medicine | Admitting: Emergency Medicine

## 2014-08-31 ENCOUNTER — Encounter (HOSPITAL_COMMUNITY): Payer: Self-pay | Admitting: Family Medicine

## 2014-08-31 DIAGNOSIS — Z8669 Personal history of other diseases of the nervous system and sense organs: Secondary | ICD-10-CM | POA: Insufficient documentation

## 2014-08-31 DIAGNOSIS — R079 Chest pain, unspecified: Secondary | ICD-10-CM | POA: Diagnosis not present

## 2014-08-31 DIAGNOSIS — Z794 Long term (current) use of insulin: Secondary | ICD-10-CM | POA: Insufficient documentation

## 2014-08-31 DIAGNOSIS — J159 Unspecified bacterial pneumonia: Secondary | ICD-10-CM | POA: Insufficient documentation

## 2014-08-31 DIAGNOSIS — E079 Disorder of thyroid, unspecified: Secondary | ICD-10-CM | POA: Diagnosis not present

## 2014-08-31 DIAGNOSIS — I10 Essential (primary) hypertension: Secondary | ICD-10-CM | POA: Insufficient documentation

## 2014-08-31 DIAGNOSIS — Z7982 Long term (current) use of aspirin: Secondary | ICD-10-CM | POA: Insufficient documentation

## 2014-08-31 DIAGNOSIS — Z8673 Personal history of transient ischemic attack (TIA), and cerebral infarction without residual deficits: Secondary | ICD-10-CM | POA: Insufficient documentation

## 2014-08-31 DIAGNOSIS — K219 Gastro-esophageal reflux disease without esophagitis: Secondary | ICD-10-CM | POA: Diagnosis not present

## 2014-08-31 DIAGNOSIS — E785 Hyperlipidemia, unspecified: Secondary | ICD-10-CM | POA: Insufficient documentation

## 2014-08-31 DIAGNOSIS — F329 Major depressive disorder, single episode, unspecified: Secondary | ICD-10-CM | POA: Insufficient documentation

## 2014-08-31 DIAGNOSIS — E119 Type 2 diabetes mellitus without complications: Secondary | ICD-10-CM | POA: Diagnosis not present

## 2014-08-31 DIAGNOSIS — J181 Lobar pneumonia, unspecified organism: Secondary | ICD-10-CM

## 2014-08-31 DIAGNOSIS — Z79899 Other long term (current) drug therapy: Secondary | ICD-10-CM | POA: Insufficient documentation

## 2014-08-31 DIAGNOSIS — J189 Pneumonia, unspecified organism: Secondary | ICD-10-CM

## 2014-08-31 DIAGNOSIS — Z792 Long term (current) use of antibiotics: Secondary | ICD-10-CM | POA: Insufficient documentation

## 2014-08-31 LAB — CBC
HCT: 34.9 % — ABNORMAL LOW (ref 36.0–46.0)
Hemoglobin: 11.9 g/dL — ABNORMAL LOW (ref 12.0–15.0)
MCH: 29.8 pg (ref 26.0–34.0)
MCHC: 34.1 g/dL (ref 30.0–36.0)
MCV: 87.5 fL (ref 78.0–100.0)
Platelets: 314 10*3/uL (ref 150–400)
RBC: 3.99 MIL/uL (ref 3.87–5.11)
RDW: 13.1 % (ref 11.5–15.5)
WBC: 8.1 10*3/uL (ref 4.0–10.5)

## 2014-08-31 LAB — BASIC METABOLIC PANEL
ANION GAP: 11 (ref 5–15)
BUN: 14 mg/dL (ref 6–20)
CALCIUM: 8.6 mg/dL — AB (ref 8.9–10.3)
CHLORIDE: 95 mmol/L — AB (ref 101–111)
CO2: 26 mmol/L (ref 22–32)
Creatinine, Ser: 0.95 mg/dL (ref 0.44–1.00)
GFR, EST NON AFRICAN AMERICAN: 58 mL/min — AB (ref >60)
GLUCOSE: 147 mg/dL — AB (ref 65–99)
POTASSIUM: 3.7 mmol/L (ref 3.5–5.1)
SODIUM: 132 mmol/L — AB (ref 135–145)

## 2014-08-31 LAB — URINE MICROSCOPIC-ADD ON

## 2014-08-31 LAB — URINALYSIS, ROUTINE W REFLEX MICROSCOPIC
Bilirubin Urine: NEGATIVE
Glucose, UA: NEGATIVE mg/dL
Hgb urine dipstick: NEGATIVE
KETONES UR: NEGATIVE mg/dL
NITRITE: NEGATIVE
Protein, ur: NEGATIVE mg/dL
Specific Gravity, Urine: 1.023 (ref 1.005–1.030)
Urobilinogen, UA: 0.2 mg/dL (ref 0.0–1.0)
pH: 5.5 (ref 5.0–8.0)

## 2014-08-31 LAB — I-STAT TROPONIN, ED
Troponin i, poc: 0.01 ng/mL (ref 0.00–0.08)
Troponin i, poc: 0 ng/mL (ref 0.00–0.08)

## 2014-08-31 MED ORDER — NITROGLYCERIN 0.4 MG SL SUBL: 0.4000 mg | SUBLINGUAL_TABLET | SUBLINGUAL | Status: DC | PRN

## 2014-08-31 MED ORDER — AMOXICILLIN-POT CLAVULANATE 875-125 MG PO TABS
1.0000 | ORAL_TABLET | Freq: Once | ORAL | Status: AC
  Administered 2014-08-31: 1 via ORAL
  Filled 2014-08-31: qty 1

## 2014-08-31 MED ORDER — MORPHINE SULFATE 4 MG/ML IJ SOLN
4.0000 mg | Freq: Once | INTRAMUSCULAR | Status: AC
  Administered 2014-08-31: 4 mg via INTRAVENOUS
  Filled 2014-08-31: qty 1

## 2014-08-31 MED ORDER — AMOXICILLIN-POT CLAVULANATE 875-125 MG PO TABS: 1.0000 | ORAL_TABLET | Freq: Two times a day (BID) | ORAL | Status: DC

## 2014-08-31 MED ORDER — AZITHROMYCIN 250 MG PO TABS: ORAL_TABLET | ORAL | Status: DC

## 2014-08-31 NOTE — ED Notes (Signed)
Per EMS, pt from home complaining of chest pain radiating to the left arm x 1 hour. 2 nitro PTA. 325 ASA. No relief after nitro possibly expired. EMS gave nitro and pain went from 8/10 to 5/10. 4 zofran 4 mg in route.

## 2014-08-31 NOTE — ED Provider Notes (Signed)
CSN: [id number]     Arrival date & time 08/31/14  1835 History   First MD Initiated Contact with Patient 08/31/14 1844     Chief Complaint  Patient presents with  . Chest Pain     (Consider location/radiation/quality/duration/timing/severity/associated sxs/prior Treatment) The history is provided by the patient and the EMS personnel. No language interpreter was used.  Patient is a 74 year old female with past medical history of type 2 diabetes, hypothyroidism, hyperlipidemia who presents today with left-sided chest pain that she describes as sharp that started around 5 PM today. Patient relates that she was sitting at home and had sudden onset of left-sided chest pain that she describes as sharp and like a knife stabbing in her back. She denies any previous episodes similar to this. She denies any nausea but does relate that she felt clammy and sweaty. She relates that she also felt lightheaded as well. She denies any syncope. She denies any recent fevers or chills. She endorses shortness of breath with this. Patient does relate that she has known edema in her lower extremities bilaterally and is currently on Lasix at her primary doctor put her on recently. She relates that she has not had any problems with this. She's not had any hematuria or dysuria or symptoms of urinary tract infection. Patient notably took nitroglycerin that she had at home that was expired and relates it did not improve her pain much. EMS relates that they gave her nitroglycerin in route and this did seem to significantly improve her chest pain. Currently she relates that her chest pain is still present and would like medicine to help ease the soft. She describes her chest pain now as pressure like.  Past Medical History  Diagnosis Date  . Thyroid disease   . Hypertension   . Ischemic colitis, enteritis, or enterocolitis   . Sleep apnea   . Urinary bladder incontinence   . GERD (gastroesophageal reflux disease)   .  Hyperlipidemia   . Diabetes mellitus     diet controlled  . Stroke     x's 2  . Depression     Bipolar  . Vertigo    Past Surgical History  Procedure Laterality Date  . Retinal detachment surgery    . Appendectomy    . Tonsillectomy    . Hemorrhoid surgery    . Fracture surgery    . Cholecystectomy    . Abdominal hysterectomy      partial    Family History  Problem Relation Age of Onset  . Heart disease Mother   . Stroke Father   . Cancer Sister   . Cancer Brother   . Parkinson's disease Father    Social History  Substance Use Topics  . Smoking status: Never Smoker   . Smokeless tobacco: Never Used  . Alcohol Use: No   OB History    No data available     Review of Systems  Constitutional: Positive for diaphoresis. Negative for fever and chills.  HENT: Negative for congestion and rhinorrhea.   Respiratory: Positive for shortness of breath. Negative for cough.   Cardiovascular: Positive for chest pain. Negative for palpitations and leg swelling.  Endocrine: Negative for polyphagia and polyuria.  Genitourinary: Negative for dysuria and difficulty urinating.  Skin: Negative for color change and pallor.  Neurological: Negative for light-headedness and headaches.  Psychiatric/Behavioral: Negative for confusion and agitation.  All other systems reviewed and are negative.     Allergies  Flagyl; Metformin and related; Milk-related  compounds; Other; and Ciprofloxacin  Home Medications   Prior to Admission medications   Medication Sig Start Date End Date Taking? Authorizing Provider  amoxicillin (AMOXIL) 500 MG capsule Take 500 mg by mouth 3 (three) times daily. 5 day course started 08/28/14 for bladder infection   Yes Historical Provider, MD  aspirin 325 MG tablet Take 325 mg by mouth daily with breakfast.    Yes Historical Provider, MD  betamethasone dipropionate (DIPROLENE) 0.05 % cream Apply 1 application topically daily as needed (vaginal rash).   Yes Historical  Provider, MD  bisoprolol (ZEBETA) 5 MG tablet Take 10 mg by mouth daily.    Yes Historical Provider, MD  carbamazepine (CARBATROL) 200 MG 12 hr capsule Take 200-400 mg by mouth 2 (two) times daily. Take 1 capsule (200 mg) daily with breakfast, take 2 capsules (400 mg) daily with supper   Yes Historical Provider, MD  Cholecalciferol (VITAMIN D) 2000 UNITS tablet Take 2,000 Units by mouth daily with breakfast.    Yes Historical Provider, MD  clobetasol cream (TEMOVATE) 0.05 % Apply 1 application topically daily as needed (skin rash).   Yes Historical Provider, MD  diltiazem (CARTIA XT) 180 MG 24 hr capsule Take 180 mg by mouth daily with supper.   Yes Historical Provider, MD  donepezil (ARICEPT) 10 MG tablet Take 10 mg by mouth daily with supper.    Yes Historical Provider, MD  esomeprazole (NEXIUM) 40 MG capsule Take 40 mg by mouth daily with supper.   Yes Historical Provider, MD  folic acid (FOLVITE) 1 MG tablet Take 1 mg by mouth daily.   Yes Historical Provider, MD  furosemide (LASIX) 20 MG tablet Take 20 mg by mouth See admin instructions. Take 1 tablet (20 mg) daily for 5 days starting 08/28/14   Yes Historical Provider, MD  gabapentin (NEURONTIN) 300 MG capsule Take 1 capsule (300 mg total) by mouth 3 (three) times daily. 06/10/14  Yes Reather Littler, MD  hydrALAZINE (APRESOLINE) 25 MG tablet Take 1 tablet (25 mg total) by mouth 3 (three) times daily. <PLEASE MAKE APPOINTMENT FOR REFILLS> 07/24/14  Yes Mihai Croitoru, MD  insulin aspart (NOVOLOG FLEXPEN) 100 UNIT/ML FlexPen INJECT 10 UNITS BEFORE BREAKFAST, LUNCH AND DINNER EVERY DAY. Patient taking differently: Inject 10 Units into the skin 3 (three) times daily with meals.  07/23/14  Yes Reather Littler, MD  Insulin Detemir (LEVEMIR FLEXTOUCH) 100 UNIT/ML Pen Inject 30 Units into the skin 2 (two) times daily.   Yes Historical Provider, MD  levothyroxine (SYNTHROID, LEVOTHROID) 75 MCG tablet Take 75 mcg by mouth daily.     Yes Historical Provider, MD   Liraglutide (VICTOZA) 18 MG/3ML SOPN (1.2 MG) DAILY AS DIRECTED Patient taking differently: Inject 1.2 mg into the skin at bedtime.  08/06/14  Yes Reather Littler, MD  meclizine (ANTIVERT) 25 MG tablet Take 1 tablet (25 mg total) by mouth 3 (three) times daily as needed. Patient taking differently: Take 25 mg by mouth 3 (three) times daily as needed for dizziness.  05/08/12  Yes Linwood Dibbles, MD  methotrexate (RHEUMATREX) 2.5 MG tablet Take 5-7.5 mg by mouth once a week. Take 2 tablets (5 mg) every Monday, may take 3 tablets (7.5 mg) if a rash is present   Yes Historical Provider, MD  metoCLOPramide (REGLAN) 5 MG tablet Take 5 mg by mouth 4 (four) times daily as needed (stomach pains).   Yes Historical Provider, MD  nitroGLYCERIN (NITROSTAT) 0.4 MG SL tablet Place 0.4 mg under the tongue every 5 (  five) minutes as needed for chest pain.    Yes Historical Provider, MD  OLANZapine (ZYPREXA) 5 MG tablet Take 5 mg by mouth daily with supper.    Yes Historical Provider, MD  Olmesartan-Amlodipine-HCTZ 40-10-12.5 MG TABS Take 1 tablet by mouth daily. Patient taking differently: Take 1 tablet by mouth daily. Tribenzor 11/21/13  Yes Mihai Croitoru, MD  ondansetron (ZOFRAN) 8 MG tablet Take 1 tablet (8 mg total) by mouth every 8 (eight) hours as needed for nausea. Patient taking differently: Take 8 mg by mouth 2 (two) times daily as needed for nausea.  05/08/12  Yes Linwood Dibbles, MD  ranitidine (ZANTAC) 150 MG capsule Take 150 mg by mouth daily as needed for heartburn (acid reflux).    Yes Historical Provider, MD  solifenacin (VESICARE) 5 MG tablet Take 5 mg by mouth daily with breakfast.   Yes Historical Provider, MD  ziprasidone (GEODON) 40 MG capsule Take 80 mg by mouth daily with supper.   Yes Historical Provider, MD  amoxicillin-clavulanate (AUGMENTIN) 875-125 MG per tablet Take 1 tablet by mouth 2 (two) times daily. 08/31/14   Madolyn Frieze, MD  azithromycin (ZITHROMAX Z-PAK) 250 MG tablet Take two tablets by mouth on  day 1 (one) and take one tablet on days 2-5 (two through 5) 08/31/14   Madolyn Frieze, MD  glucose blood (FREESTYLE TEST STRIPS) test strip Use 5 times per day 03/04/14   Reather Littler, MD  hydrALAZINE (APRESOLINE) 25 MG tablet TAKE 1 TABLET (25 MG TOTAL) BY MOUTH 3 (THREE) TIMES DAILY. Patient not taking: Reported on 08/31/2014 06/24/14   Lewayne Bunting, MD  Insulin Pen Needle 32G X 4 MM MISC Use 8 pen needles per day 06/27/13   Reather Littler, MD  Lancets (FREESTYLE) lancets Use 5/day 03/04/14   Reather Littler, MD  NOVOLOG FLEXPEN 100 UNIT/ML FlexPen INJECT 10 UNITS BEFORE BREAKFAST, LUNCH AND DINNER EVERY DAY. Patient not taking: Reported on 08/31/2014 07/23/14   Reather Littler, MD   BP 155/92 mmHg  Pulse 77  Temp(Src) 97.5 F (36.4 C) (Oral)  Resp 16  SpO2 99% Physical Exam  Constitutional: She is oriented to person, place, and time. No distress.  HENT:  Head: Atraumatic.  Eyes: Conjunctivae and EOM are normal.  Neck: Normal range of motion. Neck supple.  Pulmonary/Chest: She has wheezes (LLL base).  Abdominal: Soft. She exhibits no distension. There is no tenderness.  Musculoskeletal: Normal range of motion. She exhibits no tenderness.  Neurological: She is alert and oriented to person, place, and time.  Skin: Skin is warm and dry. She is not diaphoretic.  Psychiatric: She has a normal mood and affect. Her behavior is normal.    ED Course  Procedures (including critical care time) Labs Review Labs Reviewed  CBC - Abnormal; Notable for the following:    Hemoglobin 11.9 (*)    HCT 34.9 (*)    All other components within normal limits  BASIC METABOLIC PANEL - Abnormal; Notable for the following:    Sodium 132 (*)    Chloride 95 (*)    Glucose, Bld 147 (*)    Calcium 8.6 (*)    GFR calc non Af Amer 58 (*)    All other components within normal limits  URINALYSIS, ROUTINE W REFLEX MICROSCOPIC (NOT AT [facility]) - Abnormal; Notable for the following:    Leukocytes, UA TRACE (*)    All other components  within normal limits  URINE MICROSCOPIC-ADD ON - Abnormal; Notable for the following:  Squamous Epithelial / LPF FEW (*)    Casts HYALINE CASTS (*)    All other components within normal limits  I-STAT TROPOININ, ED  I-STAT TROPOININ, ED  Rosezena Sensor, ED    Imaging Review Dg Chest Portable 1 View  08/31/2014   CLINICAL DATA:  74 year old female with left side chest pain and shortness of breath for 2 hrs. Nonsmoker. Initial encounter.  EXAM: PORTABLE CHEST - 1 VIEW  COMPARISON:  07/08/2010 and earlier.  FINDINGS: Portable AP semi upright view at 1914 hrs. Stable lung volumes. Normal cardiac size and mediastinal contours. Visualized tracheal air column is within normal limits. Suggestion of patchy asymmetric retrocardiac opacity (arrow). Otherwise, allowing for portable technique the lungs are clear. No pneumothorax or pleural effusion.  IMPRESSION: Suggestion of patchy left lower lobe opacity, nonspecific but query pneumonia. PA and lateral view would evaluate further.   Electronically Signed   By: Odessa Fleming M.D.   On: 08/31/2014 19:22   I, Madolyn Frieze, personally reviewed and evaluated these images and lab results as part of my medical decision-making.   EKG Interpretation   Date/Time:  Saturday August 31 2014 18:49:43 EDT Ventricular Rate:  86 PR Interval:  184 QRS Duration: 87 QT Interval:  373 QTC Calculation: 446 R Axis:   -57 Text Interpretation:  Sinus rhythm Left anterior fascicular block Baseline  wander in lead(s) II III aVL aVF No change vs 05-08-2012 Reconfirmed by  Fayrene Fearing  MD, MARK ([id number]) on 08/31/2014 7:32:44 PM      MDM   Final diagnoses:  Left lower lobe pneumonia  Left sided chest pain  Community acquired pneumonia    Patient is a 74 year old female with past medical history of type 2 diabetes, hypothyroidism, hyperlipidemia who presents today with left-sided chest pain that she describes as sharp that started around 5 PM today. Differential includes ACS,  pneumonia, upper respiratory infection, musculoskeletal etiology. Obtained the troponin both of which were negative. Chest x-ray does show left opacity concerning for left lower lobe pneumonia. Given these findings with the patient's pain is likely more related to infectious pneumonia process as symptoms sound atypical for ACS chest pain. No acute changes on EKG there were noted. Patient was not requiring any supplemental oxygen and was not significantly dyspneic. Feel she would be appropriate for discharge home on oral anabolic for community-acquired pneumonia. Patient was discharged home on oxygen and azithromycin. Patient agreeable as planned and discharged home in good condition.    Madolyn Frieze, MD 09/01/14 [id number]  Rolland Porter, MD 09/06/14 [phone]

## 2014-08-31 NOTE — ED Notes (Signed)
Dr. James at bedside  

## 2014-09-01 NOTE — ED Provider Notes (Signed)
Pt seen and evaluated.  C/O sharp Left sided CP.  No ACS via EKG, or Trop.  On Physical has crackles posterior left chest, and infiltrate on CXR.  Denies fever, but reports chills.  I agree with antibiotic choice, and out patient for CAP.  Rolland Porter, MD 09/01/14 [phone]

## 2014-09-02 ENCOUNTER — Ambulatory Visit: Payer: Medicare Other | Admitting: Rehabilitative and Restorative Service Providers"

## 2014-09-03 ENCOUNTER — Ambulatory Visit
Admission: RE | Admit: 2014-09-03 | Discharge: 2014-09-03 | Disposition: A | Discharge status: Home / Self Care | Payer: Medicare Other | Source: Ambulatory Visit | Attending: Neurology | Admitting: Neurology

## 2014-09-03 DIAGNOSIS — R42 Dizziness and giddiness: Secondary | ICD-10-CM

## 2014-09-03 DIAGNOSIS — R2681 Unsteadiness on feet: Secondary | ICD-10-CM

## 2014-09-03 MED ORDER — GADOBENATE DIMEGLUMINE 529 MG/ML IV SOLN
20.0000 mL | Freq: Once | INTRAVENOUS | Status: AC | PRN
  Administered 2014-09-03: 20 mL via INTRAVENOUS

## 2014-09-04 ENCOUNTER — Ambulatory Visit: Payer: Medicare Other | Admitting: Rehabilitative and Restorative Service Providers"

## 2014-09-05 ENCOUNTER — Telehealth: Payer: Self-pay | Admitting: Neurology

## 2014-09-05 NOTE — Telephone Encounter (Signed)
Left message to return my call.  

## 2014-09-05 NOTE — Telephone Encounter (Signed)
Michelle: Please call patient, I have reviewed MRI of the brain, showed mild small vessel disease, no change compared to previous MRI of the brain, MRA of the neck showed no large vessel disease  I will review the detail of the imaging finding with her at her next follow-up visit in October

## 2014-09-05 NOTE — Telephone Encounter (Signed)
Patient returned your call.

## 2014-09-05 NOTE — Telephone Encounter (Signed)
Spoke to patient - she is aware of both test results.

## 2014-09-11 ENCOUNTER — Ambulatory Visit: Payer: Medicare Other | Admitting: Endocrinology

## 2014-09-16 ENCOUNTER — Ambulatory Visit: Payer: Medicare Other | Admitting: Rehabilitative and Restorative Service Providers"

## 2014-09-16 ENCOUNTER — Other Ambulatory Visit: Payer: Medicare Other

## 2014-09-18 ENCOUNTER — Encounter: Payer: Medicare Other | Admitting: Rehabilitative and Restorative Service Providers"

## 2014-09-18 ENCOUNTER — Telehealth: Payer: Self-pay | Admitting: Neurology

## 2014-09-18 NOTE — Telephone Encounter (Signed)
Spoke to patient - she has contacted her PCP for PT approval since he was the physician who referred her there for treatment.

## 2014-09-18 NOTE — Telephone Encounter (Signed)
Pt would like a letter stating that it is ok for her to start back on her physical therapy. She would like the [facility] sent [facility].  Please call and advise [phone]

## 2014-09-19 ENCOUNTER — Encounter: Payer: Self-pay | Admitting: Endocrinology

## 2014-09-19 ENCOUNTER — Ambulatory Visit (INDEPENDENT_AMBULATORY_CARE_PROVIDER_SITE_OTHER): Payer: Medicare Other | Admitting: Endocrinology

## 2014-09-19 ENCOUNTER — Other Ambulatory Visit: Payer: Medicare Other

## 2014-09-19 VITALS — BP 158/94 | HR 84 | Temp 98.1°F | Resp 16 | Ht 63.0 in | Wt 224.8 lb

## 2014-09-19 DIAGNOSIS — I10 Essential (primary) hypertension: Secondary | ICD-10-CM | POA: Diagnosis not present

## 2014-09-19 DIAGNOSIS — E038 Other specified hypothyroidism: Secondary | ICD-10-CM

## 2014-09-19 DIAGNOSIS — E1165 Type 2 diabetes mellitus with hyperglycemia: Secondary | ICD-10-CM

## 2014-09-19 DIAGNOSIS — E063 Autoimmune thyroiditis: Secondary | ICD-10-CM

## 2014-09-19 DIAGNOSIS — IMO0002 Reserved for concepts with insufficient information to code with codable children: Secondary | ICD-10-CM

## 2014-09-19 DIAGNOSIS — Z23 Encounter for immunization: Secondary | ICD-10-CM | POA: Diagnosis not present

## 2014-09-19 LAB — MICROALBUMIN / CREATININE URINE RATIO
Creatinine,U: 148.7 mg/dL
Microalb Creat Ratio: 0.5 mg/g (ref 0.0–30.0)
Microalb, Ur: 0.8 mg/dL (ref 0.0–1.9)

## 2014-09-19 NOTE — Patient Instructions (Addendum)
8 Units at breakfast not 10  Don't take Levemir on am when starting V-go

## 2014-09-19 NOTE — Progress Notes (Signed)
Patient ID: [name], female   DOB: [birth year], 74 y.o.   MRN: [id number]    Reason for Appointment: Followup for Type 2 Diabetes  History of Present Illness:          Diagnosis: Type 2 diabetes mellitus, date of diagnosis:2011       Past history: Since she was apparently intolerant to metformin she was treated with Onglyza until about 2014  At that time because of increasing A1c of about 8% she was started on Levemir 15 units and this was aggressively increased Onglyza was continued She has not tried any other treatments for diabetes Her A1c has been 10-10.5 in 2015 In 3/15 she was told to start small doses of NovoLog at lunch and supper and add 15 units of Levemir at night also To control  hyperglycemia with her basal bolus insulin regimen she was given Victoza in addition on her initial consultation in 5/15  Recent history:   Forgets shot out  She is on a basal bolus insulin regimen including Levemir twice a day She has been on Victoza since 8/15 and she has tolerated 1.2 mg Her blood sugars are generally well controlled but A1c is slightly higher recently  Current blood sugar patterns and problems:  Fasting blood sugars are somewhat higher than before and did have relatively high readings about 2 weeks ago; she does not think this is from forgetting her Levemir  She has been eating out more and she would frequently forget her Novolog when she is eating out  Again she is not taking much insulin before eating when her blood sugars are normal for fear of low sugars  She has a couple of readings over 200 in the evening partly from forgetting her insulin and also sometimes checks her sugar right after eating  No hypoglycemia currently with the lowest reading 74 and overall the lowest readings are probably before lunch  Overall her blood sugars are still fairly good with average 139    Oral hypoglycemic drugs the patient is taking are: None    Side effects from  medications have been: Metformin: rash INSULIN regimen is described as: Novolog ac 10; Levemir 30 am and 30 pm  Glucose monitoring:  done 1-3 time a day         Glucometer:  One Touch.      Blood Glucose readings from home glucose meter download:  Mean values apply above for all meters except median for One Touch  PRE-MEAL Fasting Lunch afternoon PCS Overall  Glucose range: 106-166 78-178 92-171 126-250   Mean/median: 140 112  165 140    Glycemic control:   A1c was 7  from PCP in 8/16  Lab Results  Component Value Date   HGBA1C 6.8* 07/30/2014   HGBA1C 6.6* 04/26/2014   HGBA1C 7.1* 01/31/2014   Lab Results  Component Value Date   LDLCALC 75 04/26/2014   CREATININE 0.95 08/31/2014    Self-care: The diet that the patient has been following is: tries to limit fats     Meals: 3 meals per day. Bfst oatmeal or egg/toast at 8-9 am, dinner 5 pm cutting back on frequent meals and  portions; no hs snacks          Exercise: Walk more       Dietician visit: Most recent: 2014 ( class).               Compliance with the medical regimen: Good  Retinal exam: Most recent:.9/14  Weight history:  Wt Readings from Last 3 Encounters:  09/19/14 224 lb 12.8 oz (101.969 kg)  08/21/14 228 lb (103.42 kg)  05/02/14 229 lb (103.874 kg)      Medication List       This list is accurate as of: 09/19/14 12:40 PM.  Always use your most recent med list.               amoxicillin 500 MG capsule  Commonly known as:  AMOXIL  Take 500 mg by mouth 3 (three) times daily. 5 day course started 08/28/14 for bladder infection     amoxicillin-clavulanate 875-125 MG per tablet  Commonly known as:  AUGMENTIN  Take 1 tablet by mouth 2 (two) times daily.     aspirin 325 MG tablet  Take 325 mg by mouth daily with breakfast.     azithromycin 250 MG tablet  Commonly known as:  ZITHROMAX Z-PAK  Take two tablets by mouth on day 1 (one) and take one tablet on days 2-5 (two through 5)     betamethasone  dipropionate 0.05 % cream  Commonly known as:  DIPROLENE  Apply 1 application topically daily as needed (vaginal rash).     bisoprolol 5 MG tablet  Commonly known as:  ZEBETA  Take 10 mg by mouth daily.     carbamazepine 200 MG 12 hr capsule  Commonly known as:  CARBATROL  Take 200-400 mg by mouth 2 (two) times daily. Take 1 capsule (200 mg) daily with breakfast, take 2 capsules (400 mg) daily with supper     CARTIA XT 180 MG 24 hr capsule  Generic drug:  diltiazem  Take 180 mg by mouth daily with supper.     clobetasol cream 0.05 %  Commonly known as:  TEMOVATE  Apply 1 application topically daily as needed (skin rash).     donepezil 10 MG tablet  Commonly known as:  ARICEPT  Take 10 mg by mouth daily with supper.     esomeprazole 40 MG capsule  Commonly known as:  NEXIUM  Take 40 mg by mouth daily with supper.     folic acid 1 MG tablet  Commonly known as:  FOLVITE  Take 1 mg by mouth daily.     freestyle lancets  Use 5/day     furosemide 20 MG tablet  Commonly known as:  LASIX  Take 20 mg by mouth See admin instructions. Take 1 tablet (20 mg) daily for 5 days starting 08/28/14     gabapentin 300 MG capsule  Commonly known as:  NEURONTIN  Take 1 capsule (300 mg total) by mouth 3 (three) times daily.     glucose blood test strip  Commonly known as:  FREESTYLE TEST STRIPS  Use 5 times per day     hydrALAZINE 25 MG tablet  Commonly known as:  APRESOLINE  Take 1 tablet (25 mg total) by mouth 3 (three) times daily. <PLEASE MAKE APPOINTMENT FOR REFILLS>     Insulin Pen Needle 32G X 4 MM Misc  Use 8 pen needles per day     LEVEMIR FLEXTOUCH 100 UNIT/ML Pen  Generic drug:  Insulin Detemir  Inject 30 Units into the skin 2 (two) times daily.     levothyroxine 75 MCG tablet  Commonly known as:  SYNTHROID, LEVOTHROID  Take 75 mcg by mouth daily.     Liraglutide 18 MG/3ML Sopn  Commonly known as:  VICTOZA  (1.2 MG) DAILY AS DIRECTED     meclizine  25 MG tablet   Commonly known as:  ANTIVERT  Take 1 tablet (25 mg total) by mouth 3 (three) times daily as needed.     methotrexate 2.5 MG tablet  Commonly known as:  RHEUMATREX  Take 5-7.5 mg by mouth once a week. Take 2 tablets (5 mg) every Monday, may take 3 tablets (7.5 mg) if a rash is present     metoCLOPramide 5 MG tablet  Commonly known as:  REGLAN  Take 5 mg by mouth 4 (four) times daily as needed (stomach pains).     nitroGLYCERIN 0.4 MG SL tablet  Commonly known as:  NITROSTAT  Place 0.4 mg under the tongue every 5 (five) minutes as needed for chest pain.     NOVOLOG FLEXPEN 100 UNIT/ML FlexPen  Generic drug:  insulin aspart  INJECT 10 UNITS BEFORE BREAKFAST, LUNCH AND DINNER EVERY DAY.     insulin aspart 100 UNIT/ML FlexPen  Commonly known as:  NOVOLOG FLEXPEN  INJECT 10 UNITS BEFORE BREAKFAST, LUNCH AND DINNER EVERY DAY.     OLANZapine 5 MG tablet  Commonly known as:  ZYPREXA  Take 5 mg by mouth daily with supper.     Olmesartan-Amlodipine-HCTZ 40-10-12.5 MG Tabs  Take 1 tablet by mouth daily.     ondansetron 8 MG tablet  Commonly known as:  ZOFRAN  Take 1 tablet (8 mg total) by mouth every 8 (eight) hours as needed for nausea.     ranitidine 150 MG capsule  Commonly known as:  ZANTAC  Take 150 mg by mouth daily as needed for heartburn (acid reflux).     solifenacin 5 MG tablet  Commonly known as:  VESICARE  Take 5 mg by mouth daily with breakfast.     Vitamin D 2000 UNITS tablet  Take 2,000 Units by mouth daily with breakfast.     ziprasidone 40 MG capsule  Commonly known as:  GEODON  Take 80 mg by mouth daily with supper.        Allergies:  Allergies  Allergen Reactions  . Flagyl [Metronidazole Hcl] Itching and Rash  . Ciprofloxacin Itching and Rash  . Metformin And Related Rash  . Milk-Related Compounds Other (See Comments)    Stomach pains  . Other Other (See Comments)    [place] nuts cause severe facial redness    Past Medical History  Diagnosis  Date  . Thyroid disease   . Hypertension   . Ischemic colitis, enteritis, or enterocolitis   . Sleep apnea   . Urinary bladder incontinence   . GERD (gastroesophageal reflux disease)   . Hyperlipidemia   . Diabetes mellitus     diet controlled  . Stroke     x's 2  . Depression     Bipolar  . Vertigo     Past Surgical History  Procedure Laterality Date  . Retinal detachment surgery    . Appendectomy    . Tonsillectomy    . Hemorrhoid surgery    . Fracture surgery    . Cholecystectomy    . Abdominal hysterectomy      partial     Family History  Problem Relation Age of Onset  . Heart disease Mother   . Stroke Father   . Cancer Sister   . Cancer Brother   . Parkinson's disease Father     Social History:  reports that she has never smoked. She has never used smokeless tobacco. She reports that she does not drink alcohol or use illicit  drugs.    Review of Systems   Legs hurt on walking at times  from the knee down, not as much in the feet.  No excessive burning, tingling or sharp pains in her feet.  Taking Gabapentin bid         Lipids: Last LDL from PCP was 101, followed by PCP also        Lab Results  Component Value Date   CHOL 153 04/26/2014   HDL 55.60 04/26/2014   LDLCALC 75 04/26/2014   TRIG 113.0 04/26/2014   CHOLHDL 3 04/26/2014       Thyroid:   Has had presumed hypothyroidism for over 20 years, has not had a change in medications in several years and last TSH was  1.9 in August from PCP   Lab Results  Component Value Date   TSH 1.65 01/31/2014   TSH 0.51 08/28/2013   TSH 0.76 07/05/2013   FREET4 0.86 01/31/2014   FREET4 0.91 08/28/2013       The blood pressure has been controlled with diltiazem, Benicar HCT and bisoprolol  Blood pressure is recently higher, BP with the primary care doctor was 155/92    Physical Examination:  BP 158/94 mmHg  Pulse 84  Temp(Src) 98.1 F (36.7 C)  Resp 16  Ht 5\' 3"  (1.6 m)  Wt 224 lb 12.8 oz (101.969  kg)  BMI 39.83 kg/m2  SpO2 95%     ASSESSMENT/PLAN:   Diabetes type 2, uncontrolled   Her blood sugars have been overall fairly good with A1c 7% which is probably adequate considering her multiple medical problems and age See history of present illness for detailed discussion of his current management, blood sugar patterns and problems identified She is having some difficulty with compliance with her mealtime insulin especially when eating out Also overall she is taking 5 injections of insulin daily  Discussed the option of using the V-go pump instead of the basal bolus insulin regimen; even though she is taking 60 units of basal insulin daily she may be able to get adequate control with the 40 unit basal pump Have shown her the insulin pump and discussed how this would work She seems very interested in trying this out   Most likely she will need less insulin with the pump and may be able to get by with 6-8 units for boluses at meals instead of 10.  She will also need to continue taking Victoza which has helped her control is significantly  Also discussed needing to take at least some insulin before her meals consistently and not reduce it excessively  Since her blood sugars are relatively lower at lunchtime she can reduce her morning Novolog to 8 units  Hypertension: Blood pressure is followed by PCP. Still not controlled despite multiple medications   Patient Instructions  8 Units at breakfast not 10  Don't take Levemir on am when starting V-go   Counseling time on subjects discussed above is over 50% of today's 25 minute visit   [name] 09/19/2014, 12:40 PM   Note: This office note was prepared with Insurance underwriter. Any transcriptional errors that result from this process are unintentional.

## 2014-09-21 ENCOUNTER — Other Ambulatory Visit: Payer: Self-pay | Admitting: Cardiovascular Disease

## 2014-09-24 ENCOUNTER — Other Ambulatory Visit: Payer: Self-pay | Admitting: *Deleted

## 2014-09-26 ENCOUNTER — Encounter: Payer: Self-pay | Admitting: Rehabilitative and Restorative Service Providers"

## 2014-09-26 ENCOUNTER — Ambulatory Visit: Payer: Medicare Other | Attending: Internal Medicine | Admitting: Rehabilitative and Restorative Service Providers"

## 2014-09-26 VITALS — BP 117/67 | HR 76

## 2014-09-26 DIAGNOSIS — R269 Unspecified abnormalities of gait and mobility: Secondary | ICD-10-CM | POA: Insufficient documentation

## 2014-09-26 NOTE — Therapy (Signed)
[facility] [address] [place], [place], [address] Phone: [phone]   Fax:  [phone]  Physical Therapy Treatment  Patient Details  Name: [name] MRN: [id number] Date of Birth: Feb 27, 1940 Referring Provider:  Merrilee Seashore, MD  Encounter Date: 09/26/2014      PT End of Session - 09/26/14 2248    Visit Number 6   Number of Visits 12   Date for PT Re-Evaluation 09/13/14   Authorization Type G code every 10th visit   PT Start Time 1015   PT Stop Time 1100   PT Time Calculation (min) 45 min   Equipment Utilized During Treatment Gait belt   Activity Tolerance Patient tolerated treatment well   Behavior During Therapy [facility] for tasks assessed/performed      Past Medical History  Diagnosis Date  . Thyroid disease   . Hypertension   . Ischemic colitis, enteritis, or enterocolitis   . Sleep apnea   . Urinary bladder incontinence   . GERD (gastroesophageal reflux disease)   . Hyperlipidemia   . Diabetes mellitus     diet controlled  . Stroke     x's 2  . Depression     Bipolar  . Vertigo     Past Surgical History  Procedure Laterality Date  . Retinal detachment surgery    . Appendectomy    . Tonsillectomy    . Hemorrhoid surgery    . Fracture surgery    . Cholecystectomy    . Abdominal hysterectomy      partial     Filed Vitals:   09/26/14 1034  BP: 117/67  Pulse: 76    Visit Diagnosis:  Abnormality of gait      Subjective Assessment - 09/26/14 1025    Subjective The patient was travelling in [place] and got food poisoning.  Since last PT visit, she also had an ED visit for chest pain.  She had 3 episodes of dizziness in which she woke up with spinning and took meclizine.  Each episode lasted one day and she remained in bed.  She reports exercises do not bring on dizziness.  The patient reports she would want to improve her walking stating that it feels like she pounces.    Patient Stated Goals reduce dizziness, improve mobility   Currently in Pain? No/denies                Vestibular Assessment - 09/26/14 1027    Positional Testing   Sidelying Test Sidelying Right;Sidelying Left   Sidelying Right   Sidelying Right Duration c/o lightheadedness with return to sit   Sidelying Right Symptoms No nystagmus   Sidelying Left   Sidelying Left Duration c/o lightheadedness with return to sit   Sidelying Left Symptoms No nystagmus                 [facility] Adult PT Treatment/Exercise - 09/26/14 1029    Ambulation/Gait   Ambulation/Gait Yes   Ambulation/Gait Assistance 6: Modified independent (Device/Increase time)   Ambulation Distance (Feet) 345 Feet   Gait Pattern --  wider base of support, feet ER,    Pre-Gait Activities feet in stride worked on weight shifting anteriorly posteriorly with ankle coordination and reaching   Gait Comments Carried over weight shift ant/posteriorly and laterally into walking activities for improved reciprocal pattern and greater fluidity of movement   Self-Care   Self-Care Other Self-Care Comments   Other Self-Care Comments  The patient reports she has  tried [employer], [employer], aquatics, and many other exercise classes.  She does not feel that she will stick with a routine for mobility.  PT discussed attempting to increase endurance/activity toleance by slowly increasing walking distance.  She feels that short distance walking exhausts her and she feels short of breath when attempting to increase her distance.                   PT Short Term Goals - 08/26/14 1027    PT SHORT TERM GOAL #1   Title The patient will return demo HEP for habituation and gaze adaptation.   Baseline met on 08/26/2014   Time 4   Period Weeks   Status Achieved   PT SHORT TERM GOAL #2   Title The patient will report dizziness improved from 6/10 down to < or equal to 4/10 with sit<>bilateral sidelying.   Baseline Met on 08/26/2014   Time 4    Period Weeks   Status Achieved   PT SHORT TERM GOAL #3   Title The patient will report dizziness improved with rolling in bed from 6/10 down to< or equal to 4/10 with rolling supine<>bilateral sidelying.    Baseline Met on 08/26/2014   Time 4   Period Weeks   Status Achieved   PT SHORT TERM GOAL #4   Title The patient will tolerate gaze x 1 viewing x 30 seconds for improved motion tolerance (tolerates 6 reps at eval).   Baseline Met on 08/26/2014 with dizziness 3/10, but able to complete 30 seconds.   Time 4   Period Weeks   Status Achieved           PT Long Term Goals - 09/26/14 2248    PT LONG TERM GOAL #1   Title The patient will be indep with progression of HEP for post d/c activities.   Baseline The patient feels comfortable with current HEP per reports.   Time 8   Period Weeks   Status Achieved   PT LONG TERM GOAL #2   Title Further PT assessment as indicated for gait speed and goal to follow if indicated.   Baseline Met on 08/22/2014 with patient scoring 2.91 ft/sec indicating "full community ambulator" classification of gait.   Time 8   Period Weeks   Status Achieved   PT LONG TERM GOAL #3   Title Further PT assessment as indicated for Berg balance scale.   Baseline Target date 09/13/2014-- Met goals on 08/19/2014 with patient scoring 34/56 on Berg.   Time 8   Period Weeks   Status Achieved   PT LONG TERM GOAL #4   Title Improve Berg from 34/56 up to > or equal to 40/56.    Baseline Met on 8/102/106 with patient improving to 41/56.   Time 8   Period Weeks   Status Achieved               Plan - 09/26/14 2249    Clinical Impression Statement The patient met LTGs for therapy.  She continues with decreased balance and abnormalities of gait, however showed improvement with mobility and reports feeling like she can continue HEP well on her own.  She feels 2 recent illnesses (pneumonia and food poisoning) have slowed progress.   Pt will benefit from skilled  therapeutic intervention in order to improve on the following deficits Abnormal gait;Decreased balance;Dizziness;Decreased mobility;Decreased activity tolerance;Postural dysfunction   Rehab Potential Good   PT Frequency 1x / week   PT Treatment/Interventions Vestibular;Gait  training;Neuromuscular re-education;Therapeutic exercise;Balance training;Therapeutic activities;Functional mobility training;Stair training   PT Next Visit Plan D/c from PT with continuation of HEP.   Consulted and Agree with Plan of Care Patient          G-Codes - 10/12/14 [id number]    Functional Assessment Tool Used vertigo improved   Functional Limitation Mobility: Walking and moving around   Mobility: Walking and Moving Around Goal Status [phone]) At least 20 percent but less than 40 percent impaired, limited or restricted   Mobility: Walking and Moving Around Discharge Status [id number]) At least 20 percent but less than 40 percent impaired, limited or restricted      Problem List Patient Active Problem List   Diagnosis Date Noted  . Chest pain at rest 10/30/2013  . Exertional dyspnea 10/30/2013  . Hyperlipidemia 10/30/2013  . Morbid obesity 10/30/2013  . Acquired autoimmune hypothyroidism 09/02/2013  . Type II or unspecified type diabetes mellitus without mention of complication, uncontrolled 06/14/2013  . Essential hypertension, benign 06/14/2013  . Other and unspecified hyperlipidemia 06/14/2013    Peja Allender, PT 10/12/2014, 10:55 PM  [facility] [address] [place], [place], [address] Phone: [phone]   Fax:  [phone]

## 2014-09-26 NOTE — Therapy (Signed)
[facility] [address] [place], [place], [address] Phone: [phone]   Fax:  [phone]  Patient Details  Name: [name] MRN: [id number] Date of Birth: 09/14/1940 Referring Provider:  No ref. provider found  Encounter Date: 09/26/2014  PHYSICAL THERAPY DISCHARGE SUMMARY  Visits from Start of Care: 6  Current functional level related to goals / functional outcomes:     PT Short Term Goals - 08/26/14 1027    PT SHORT TERM GOAL #1   Title The patient will return demo HEP for habituation and gaze adaptation.   Baseline met on 08/26/2014   Time 4   Period Weeks   Status Achieved   PT SHORT TERM GOAL #2   Title The patient will report dizziness improved from 6/10 down to < or equal to 4/10 with sit<>bilateral sidelying.   Baseline Met on 08/26/2014   Time 4   Period Weeks   Status Achieved   PT SHORT TERM GOAL #3   Title The patient will report dizziness improved with rolling in bed from 6/10 down to< or equal to 4/10 with rolling supine<>bilateral sidelying.    Baseline Met on 08/26/2014   Time 4   Period Weeks   Status Achieved   PT SHORT TERM GOAL #4   Title The patient will tolerate gaze x 1 viewing x 30 seconds for improved motion tolerance (tolerates 6 reps at eval).   Baseline Met on 08/26/2014 with dizziness 3/10, but able to complete 30 seconds.   Time 4   Period Weeks   Status Achieved         PT Long Term Goals - 09/26/14 2248    PT LONG TERM GOAL #1   Title The patient will be indep with progression of HEP for post d/c activities.   Baseline The patient feels comfortable with current HEP per reports.   Time 8   Period Weeks   Status Achieved   PT LONG TERM GOAL #2   Title Further PT assessment as indicated for gait speed and goal to follow if indicated.   Baseline Met on 08/22/2014 with patient scoring 2.91 ft/sec indicating "full community ambulator" classification of gait.   Time 8   Period Weeks   Status Achieved   PT LONG TERM GOAL #3   Title Further PT assessment as indicated for Berg balance scale.   Baseline Target date 09/13/2014-- Met goals on 08/19/2014 with patient scoring 34/56 on Berg.   Time 8   Period Weeks   Status Achieved   PT LONG TERM GOAL #4   Title Improve Berg from 34/56 up to > or equal to 40/56.    Baseline Met on 8/102/106 with patient improving to 41/56.   Time 8   Period Weeks   Status Achieved        Remaining deficits: Abnormality of gait Imbalance Occasional episodes of intermittent vertigo (patient reports overall improved, but still has occasional episodes that last 1 day).   Education / Equipment: HEP, community exercise options (not currently interested), home safety.  Plan: Patient agrees to discharge.  Patient goals were met. Patient is being discharged due to meeting the stated rehab goals.  ?????        Thank you for the referral of this patient.  , MPT  , 09/26/2014, 10:58 PM  [facility] [address] [place], [place], [address] Phone: [phone]   Fax:  [phone] 

## 2014-09-26 NOTE — Patient Instructions (Signed)
PRE GAIT: One Step With Weight Shift   Stand with upright posture. Step forward with left leg. Shift weight forward onto leg, straighten hip and knee. Rock front and back shifting weight back and forth between each leg.  Do 10 times, then switch feet.  Can repeat 2 times/day.  *Stand near countertop or chair for support.  Copyright  VHI. All rights reserved.  Weight Shift: Lateral (Righting / Equilibrium)   Feet shoulder width apart, slowly shift weight over right leg.  At first, allow the heel to raise off the ground and then you can progress to lifting the leg off the ground.  *Hold countertop for support. Return to starting position. Repeat shifting weight over the left leg. Repeat 10__ times per session. Do _2___ sessions per day.  Copyright  VHI. All rights reserved.

## 2014-09-30 ENCOUNTER — Encounter: Payer: Medicare Other | Attending: Endocrinology | Admitting: Nutrition

## 2014-09-30 DIAGNOSIS — Z713 Dietary counseling and surveillance: Secondary | ICD-10-CM | POA: Diagnosis not present

## 2014-09-30 DIAGNOSIS — Z794 Long term (current) use of insulin: Secondary | ICD-10-CM | POA: Diagnosis not present

## 2014-09-30 DIAGNOSIS — E118 Type 2 diabetes mellitus with unspecified complications: Secondary | ICD-10-CM

## 2014-10-01 NOTE — Patient Instructions (Addendum)
Take no more lantus Fill and apply a new pod every morning Take 3 clicks before smaller meals (breakfast and lunch), and 4 clicks for larger meals (supper) Test blood sugars before meals and at bedtime. Apply the filled V-go at 10AM tomorrow morning

## 2014-10-01 NOTE — Progress Notes (Signed)
Patient was  Instructed on how to fill, apply and use the V-go pump.  She was given a V-go 40 starter kit with directions for filling, applying and using the pump.  We reviewed these directions and she reported good understanding for all of this.  She filled a pod as directed, but did not start on the pump because she took her Lantus this AM.  She will attach this tomorrow morning at 10AM.  She reported good understanding of this, and had no final questions.  She was given the 800 telephone number to call if questions about how to use this.

## 2014-10-02 ENCOUNTER — Telehealth: Payer: Self-pay | Admitting: Nutrition

## 2014-10-02 NOTE — Telephone Encounter (Signed)
Patient reported that she had trouble pressing the Needle removal button "last night". I called her back, and she has removed the needle at night, and puts a new V-Go on in the AM.  I explained that she is not getting insulin at night if she does this.  She was told to press the needle removal button in the AM when she takes it off, and then she will put a new one on at that time.  She said she" understood this now"  Did not remember what her blood sugar was this AM.  She was out of the house at this time.

## 2014-10-03 ENCOUNTER — Other Ambulatory Visit: Payer: Self-pay | Admitting: *Deleted

## 2014-10-03 ENCOUNTER — Telehealth: Payer: Self-pay | Admitting: Endocrinology

## 2014-10-03 MED ORDER — V-GO 40 KIT: PACK | Status: DC

## 2014-10-03 NOTE — Telephone Encounter (Signed)
Patient called stating that she is doing very well on the Vgo and would like an Rx sent to her pharmacy    [facility]   Thank You

## 2014-10-07 ENCOUNTER — Other Ambulatory Visit: Payer: Self-pay

## 2014-10-07 MED ORDER — INSULIN ASPART 100 UNIT/ML ~~LOC~~ SOLN: SUBCUTANEOUS | Status: DC

## 2014-10-07 NOTE — Telephone Encounter (Signed)
Patient need the insulin called into her pharmacy that goes with the Vgo pump, she asks if she can take levemere and the Novalog until she get her insulin for her V go pump. Please advise

## 2014-10-11 ENCOUNTER — Encounter: Payer: Self-pay | Admitting: Endocrinology

## 2014-10-11 ENCOUNTER — Ambulatory Visit (INDEPENDENT_AMBULATORY_CARE_PROVIDER_SITE_OTHER): Payer: Medicare Other | Admitting: Endocrinology

## 2014-10-11 VITALS — BP 102/56 | HR 96 | Temp 98.4°F | Resp 16 | Ht 63.0 in | Wt 231.6 lb

## 2014-10-11 DIAGNOSIS — R600 Localized edema: Secondary | ICD-10-CM | POA: Diagnosis not present

## 2014-10-11 DIAGNOSIS — IMO0002 Reserved for concepts with insufficient information to code with codable children: Secondary | ICD-10-CM

## 2014-10-11 DIAGNOSIS — I952 Hypotension due to drugs: Secondary | ICD-10-CM

## 2014-10-11 DIAGNOSIS — E1165 Type 2 diabetes mellitus with hyperglycemia: Secondary | ICD-10-CM | POA: Diagnosis not present

## 2014-10-11 MED ORDER — FUROSEMIDE 40 MG PO TABS: 40.0000 mg | ORAL_TABLET | Freq: Every day | ORAL | Status: DC

## 2014-10-11 NOTE — Patient Instructions (Addendum)
May check 2x per day at different times  Cut Tribenzor in 1/2, hold 1 daily

## 2014-10-11 NOTE — Progress Notes (Signed)
Patient ID: [name], female   DOB: Sep 26, 1940, 74 y.o.   MRN: [id number]    Reason for Appointment: Followup for Type 2 Diabetes and swelling of legs  History of Present Illness:          Diagnosis: Type 2 diabetes mellitus, date of diagnosis:2011       Past history: Since she was apparently intolerant to metformin she was treated with Onglyza until about 2014  At that time because of increasing A1c of about 8% she was started on Levemir 15 units and this was aggressively increased Onglyza was continued She has not tried any other treatments for diabetes Her A1c has been 10-10.5 in 2015 In 3/15 she was told to start small doses of NovoLog at lunch and supper and add 15 units of Levemir at night also To control  hyperglycemia with her basal bolus insulin regimen she was given Victoza in addition on her initial consultation in 5/15  Recent history:   INSULIN regimen is described as:  V-go pump 40 unit basal, boluses at meal times: 6-6-8 units  She is on a basal bolus insulin regimen and this was changed on 09/19/14 to the V-go pump She has been on Victoza since 8/15 and she is taking 1.2 mg Because of her blood sugars fluctuating and her needing multiple injections she was given a trial of the V-go pump  Current blood sugar patterns and problems:  Fasting blood sugars are much more stable than before and fairly close to normal  She has fairly flat readings throughout the day at mealtimes and at bedtime and her overall average blood sugar is better  She has a couple of higher readings in the afternoon and evening which she thinks may be related to her diet and sometimes eating fruits like prunes  No hypoglycemia  She has no difficulty with using the V-go pump, doing the boluses when she is eating out and changing it every morning    Oral hypoglycemic drugs the patient is taking are: None    Side effects from medications have been: Metformin: rash  Glucose  monitoring:  done 1-3 time a day         Glucometer:  One Touch.      Blood Glucose readings from home glucose meter download:  Mean values apply above for all meters except median for One Touch  PRE-MEAL Fasting Lunch  afternoon  Bedtime Overall  Glucose range:  104-156   101-164   105-193   104-195    Mean/median: 119  128     128     Glycemic control:   A1c was 7  from PCP in 8/16  Lab Results  Component Value Date   HGBA1C 7.0* 08/21/2014   HGBA1C 6.8* 07/30/2014   HGBA1C 6.6* 04/26/2014   Lab Results  Component Value Date   MICROALBUR 0.8 09/19/2014   LDLCALC 101 08/21/2014   CREATININE 0.95 08/31/2014    Self-care: The diet that the patient has been following is: tries to limit fats     Meals: 3 meals per day. Bfst oatmeal or egg/toast at 8-9 am, dinner 5 pm cutting back on frequent meals and  portions; no hs snacks          Exercise: Walk more       Dietician visit: Most recent: 2014 ( class).               Compliance with the medical regimen: Good  Retinal exam:  Most recent:.9/14    Weight history:  Wt Readings from Last 3 Encounters:  10/11/14 231 lb 9.6 oz (105.053 kg)  09/19/14 224 lb 12.8 oz (101.969 kg)  08/21/14 228 lb (103.42 kg)      Medication List       This list is accurate as of: 10/11/14  2:20 PM.  Always use your most recent med list.               amoxicillin-clavulanate 875-125 MG per tablet  Commonly known as:  AUGMENTIN  Take 1 tablet by mouth 2 (two) times daily.     aspirin 325 MG tablet  Take 325 mg by mouth daily with breakfast.     azithromycin 250 MG tablet  Commonly known as:  ZITHROMAX Z-PAK  Take two tablets by mouth on day 1 (one) and take one tablet on days 2-5 (two through 5)     betamethasone dipropionate 0.05 % cream  Commonly known as:  DIPROLENE  Apply 1 application topically daily as needed (vaginal rash).     bisoprolol 5 MG tablet  Commonly known as:  ZEBETA  Take 10 mg by mouth daily.      carbamazepine 200 MG 12 hr capsule  Commonly known as:  CARBATROL  Take 200-400 mg by mouth 2 (two) times daily. Take 1 capsule (200 mg) daily with breakfast, take 2 capsules (400 mg) daily with supper     CARTIA XT 180 MG 24 hr capsule  Generic drug:  diltiazem  Take 180 mg by mouth daily with supper.     clobetasol cream 0.05 %  Commonly known as:  TEMOVATE  Apply 1 application topically daily as needed (skin rash).     donepezil 10 MG tablet  Commonly known as:  ARICEPT  Take 10 mg by mouth daily with supper.     esomeprazole 40 MG capsule  Commonly known as:  NEXIUM  Take 40 mg by mouth daily with supper.     folic acid 1 MG tablet  Commonly known as:  FOLVITE  Take 1 mg by mouth daily.     freestyle lancets  Use 5/day     furosemide 40 MG tablet  Commonly known as:  LASIX  Take 1 tablet (40 mg total) by mouth daily.     gabapentin 300 MG capsule  Commonly known as:  NEURONTIN  Take 1 capsule (300 mg total) by mouth 3 (three) times daily.     glucose blood test strip  Commonly known as:  FREESTYLE TEST STRIPS  Use 5 times per day     hydrALAZINE 25 MG tablet  Commonly known as:  APRESOLINE  Take 1 tablet (25 mg total) by mouth 3 (three) times daily. Appointment needed for future refills     insulin aspart 100 UNIT/ML injection  Commonly known as:  NOVOLOG  Pt is to inject 78 units per day to use in V-GO     Insulin Pen Needle 32G X 4 MM Misc  Use 8 pen needles per day     levothyroxine 75 MCG tablet  Commonly known as:  SYNTHROID, LEVOTHROID  Take 75 mcg by mouth daily.     Liraglutide 18 MG/3ML Sopn  Commonly known as:  VICTOZA  (1.2 MG) DAILY AS DIRECTED     meclizine 25 MG tablet  Commonly known as:  ANTIVERT  Take 1 tablet (25 mg total) by mouth 3 (three) times daily as needed.     methotrexate 2.5 MG tablet  Commonly known as:  RHEUMATREX  Take 5-7.5 mg by mouth once a week. Take 2 tablets (5 mg) every Monday, may take 3 tablets (7.5 mg) if a  rash is present     metoCLOPramide 5 MG tablet  Commonly known as:  REGLAN  Take 5 mg by mouth 4 (four) times daily as needed (stomach pains).     nitroGLYCERIN 0.4 MG SL tablet  Commonly known as:  NITROSTAT  Place 0.4 mg under the tongue every 5 (five) minutes as needed for chest pain.     OLANZapine 5 MG tablet  Commonly known as:  ZYPREXA  Take 5 mg by mouth daily with supper.     Olmesartan-Amlodipine-HCTZ 40-10-12.5 MG Tabs  Take 1 tablet by mouth daily.     ondansetron 8 MG tablet  Commonly known as:  ZOFRAN  Take 1 tablet (8 mg total) by mouth every 8 (eight) hours as needed for nausea.     ranitidine 150 MG capsule  Commonly known as:  ZANTAC  Take 150 mg by mouth daily as needed for heartburn (acid reflux).     solifenacin 5 MG tablet  Commonly known as:  VESICARE  Take 5 mg by mouth daily with breakfast.     V-GO 40 Kit  Use one per day     Vitamin D 2000 UNITS tablet  Take 2,000 Units by mouth daily with breakfast.     ziprasidone 40 MG capsule  Commonly known as:  GEODON  Take 80 mg by mouth daily with supper.        Allergies:  Allergies  Allergen Reactions  . Flagyl [Metronidazole Hcl] Itching and Rash  . Ciprofloxacin Itching and Rash  . Metformin And Related Rash  . Milk-Related Compounds Other (See Comments)    Stomach pains  . Other Other (See Comments)    [place] nuts cause severe facial redness    Past Medical History  Diagnosis Date  . Thyroid disease   . Hypertension   . Ischemic colitis, enteritis, or enterocolitis   . Sleep apnea   . Urinary bladder incontinence   . GERD (gastroesophageal reflux disease)   . Hyperlipidemia   . Diabetes mellitus     diet controlled  . Stroke     x's 2  . Depression     Bipolar  . Vertigo     Past Surgical History  Procedure Laterality Date  . Retinal detachment surgery    . Appendectomy    . Tonsillectomy    . Hemorrhoid surgery    . Fracture surgery    . Cholecystectomy    .  Abdominal hysterectomy      partial     Family History  Problem Relation Age of Onset  . Heart disease Mother   . Stroke Father   . Cancer Sister   . Cancer Brother   . Parkinson's disease Father     Social History:  reports that she has never smoked. She has never used smokeless tobacco. She reports that she does not drink alcohol or use illicit drugs.    Review of Systems   She has had much more swelling lately and has difficulty getting her shoes on. She was given a few tablets of 20 mg Lasix but he did not find any relief of the swelling with this  NEUROPATHY: She has lower leg pain and takes gabapentin twice a day for this      Lipids: Last LDL from PCP was 101, followed by  PCP also        Lab Results  Component Value Date   CHOL 153 04/26/2014   HDL 55.60 04/26/2014   LDLCALC 101 08/21/2014   TRIG 113.0 04/26/2014   CHOLHDL 3 04/26/2014       Thyroid:   Has had presumed hypothyroidism for over 20 years, has not had a change in medications in several years and last TSH was  1.9 in August from PCP   Lab Results  Component Value Date   TSH 1.90 08/21/2014   TSH 1.65 01/31/2014   TSH 0.51 08/28/2013   FREET4 0.86 01/31/2014   FREET4 0.91 08/28/2013       The blood pressure has been controlled with a combination of amlodipine and Benicar HCT as well as hydralazine and bisoprolol  Blood pressure is recently much lower, she thinks it was 100/80 this morning She also feels somewhat lightheaded She does not have a follow-up appointment with her PCP next week   Physical Examination:  BP 102/56 mmHg  Pulse 96  Temp(Src) 98.4 F (36.9 C)  Resp 16  Ht 5' 3"  (1.6 m)  Wt 231 lb 9.6 oz (105.053 kg)  BMI 41.04 kg/m2  SpO2 94%    3+ pedal edema present. She does not look pale  ASSESSMENT/PLAN:   Diabetes type 2, uncontrolled   Her blood sugars have been overall fairly good but with her difficulty with compliance with her mealtime insulin as well as needing  multiple shots she is now taking his insulin with the V-go pump  Her blood sugars are very stable and close to normal throughout the day with the V-go pump and she really likes the convenience of this She is also taking less basal insulin, previously on 60 units Levemir day Bolus insulin also has been reduced since her last visit with the pump She has no difficulty operating the pump and doing the boluses when she is eating out also  She will continue using the same regimen without any change in dosage  Hypertension: Blood pressure is followed by PCP.  However with her using a new combination of medications including amlodipine 10 mg her blood pressure is relatively low and she is also feeling lightheaded  Also possibly with all of taking amlodipine she is having more edema; at the same time she appears to have gained weight and may be retaining more fluid for unknown reasons, some of this may be related to increased effectiveness of insulin now  Discussed that she will need to take Lasix 40 mg until edema is cleared and reduce her new blood pressure medication in half until seen by PCP  Patient Instructions  May check 2x per day at different times  Cut Tribenzor in 1/2, hold 1 daily     Total visit time = 25 minutes   Shaughn Thomley 10/11/2014, 2:20 PM   Note: This office note was prepared with Estate agent. Any transcriptional errors that result from this process are unintentional.

## 2014-10-13 ENCOUNTER — Other Ambulatory Visit: Payer: Self-pay | Admitting: Endocrinology

## 2014-10-21 ENCOUNTER — Encounter: Payer: Self-pay | Admitting: Neurology

## 2014-10-21 ENCOUNTER — Ambulatory Visit (INDEPENDENT_AMBULATORY_CARE_PROVIDER_SITE_OTHER): Payer: Medicare Other | Admitting: Neurology

## 2014-10-21 VITALS — BP 100/65 | HR 68 | Ht 63.0 in | Wt 227.0 lb

## 2014-10-21 DIAGNOSIS — R2681 Unsteadiness on feet: Secondary | ICD-10-CM | POA: Diagnosis not present

## 2014-10-21 DIAGNOSIS — E1142 Type 2 diabetes mellitus with diabetic polyneuropathy: Secondary | ICD-10-CM

## 2014-10-21 DIAGNOSIS — R42 Dizziness and giddiness: Secondary | ICD-10-CM

## 2014-10-21 NOTE — Progress Notes (Signed)
PATIENT: [name] DOB: Sep 03, 1940  Chief Complaint  Patient presents with  . Dizziness    She has been doing home repositional exercises and her dizziness has improved.  She declined vestibular rehab since she was feeling better.  She would like to review her MRI and MRA results.     HISTORICAL  [name] is a 74 years old left-handed female, seen in refer by her primary care physician from [facility] Dr. Nicholos Johns, [place] [facility]ner for valuation of dizziness  I have reviewed and summarized her most recent office visit in June 21 2014  She had a past medical history of hypertension, diabetes, bipolar disorder, TIA symptoms in 2012, but negative workup including MRI of the brain, EEG, hyperlipidemia, retinal detachment, obstructive sleep apnea, on CPAP machine, mild memory trouble,  Laboratory evaluation in July 2016, A1c was 6.6, LDL was 75, normal CMP, with exception of mild elevated glucose,  She complains of intermittent dizziness spells since 2013, she used to have about one spell each months, progressively worse, especially since April 2016, she has more frequent more pronounced spells, now she has 2 or 3 spells each months, lasting up to 3 days  She complains of spinning sensation, unsteady gait, mild nausea, blurry vision, difficulty focusing, usually started when she first woke up from overnight sleep, there was no associated headaches, she denies ringing in the ear, no hearing loss. Her dizziness getting worse by sudden positional change, such as bending over   She also has long-standing history of bilateral hands tremor, head titubation, her mother has similar tremor.  She also reported a long-standing history of migraine, her typical migraine are severe pounding headache with associated light noise sensitivity, nauseous, but she has not had her typical migraine for many years,  I also reviewed her MRI of the brain 2012, mild atrophy, small vessel  disease,  CAT scan of the brain June second 2016, mild atrophy, notes acute disease MRA of brain in 2012, no evidence of hemodynamic significant stenosis involving bilateral internal carotid artery, moderate tendon stenosis of proximal left vertebral artery, mild to moderate smooth narrowing of the right vertebral artery MRI of lumbar in 2012, mild degenerative disc disease, no significant foraminal canal stenosis.  UPDATE Oct 21 2014: She had a vestibular rehabilitation, which has helped improve her symptoms, she no longer has dizziness, continue has mild unsteady gait, especially with sudden positional change, she has insulin-dependent diabetes, diabetic peripheral neuropathy, bilateral feet paresthesia,  We have reviewed MRI of the brain, September 2016, mild small vessel disease, mild atrophy. MRA of the neck showed no large vessel disease.  REVIEW OF SYSTEMS: Full 14 system review of systems performed and notable only for Fatigue, cough, wheezing, leg swelling, incontinence of bladder, depression, memory loss, weakness  ALLERGIES: Allergies  Allergen Reactions  . Flagyl [Metronidazole Hcl] Itching and Rash  . Ciprofloxacin Itching and Rash  . Metformin And Related Rash  . Milk-Related Compounds Other (See Comments)    Stomach pains  . Other Other (See Comments)    [place] nuts cause severe facial redness    HOME MEDICATIONS: Current Outpatient Prescriptions  Medication Sig Dispense Refill  . aspirin EC 81 MG tablet Take 162 mg by mouth daily.     . bisoprolol (ZEBETA) 5 MG tablet Take 10 mg by mouth 2 (two) times daily.     . carbamazepine (CARBATROL) 200 MG 12 hr capsule Take 200 mg by mouth 2 (two) times daily.     Marland Kitchen  Cholecalciferol (VITAMIN D) 2000 UNITS tablet Take 2,000 Units by mouth daily.    Marland Kitchen dexlansoprazole (DEXILANT) 60 MG capsule Take 60 mg by mouth daily.    Marland Kitchen donepezil (ARICEPT) 10 MG tablet Take 10 mg by mouth at bedtime.    . fesoterodine (TOVIAZ) 8 MG TB24 Take 8  mg by mouth daily.    . folic acid (FOLVITE) 1 MG tablet Take 1 mg by mouth daily.    Marland Kitchen gabapentin (NEURONTIN) 300 MG capsule Take 1 capsule (300 mg total) by mouth 3 (three) times daily. 90 capsule 3  . glucose blood (FREESTYLE TEST STRIPS) test strip Use 5 times per day 100 each 12  . hydrALAZINE (APRESOLINE) 25 MG tablet TAKE 1 TABLET (25 MG TOTAL) BY MOUTH 3 (THREE) TIMES DAILY. 90 tablet 0  . hydrALAZINE (APRESOLINE) 25 MG tablet Take 1 tablet (25 mg total) by mouth 3 (three) times daily. <PLEASE MAKE APPOINTMENT FOR REFILLS> 90 tablet 0  . hydrOXYzine (ATARAX/VISTARIL) 50 MG tablet Take 50 mg by mouth 3 (three) times daily as needed.    . insulin aspart (NOVOLOG FLEXPEN) 100 UNIT/ML FlexPen INJECT 10 UNITS BEFORE BREAKFAST, LUNCH AND DINNER EVERY DAY. 15 mL 2  . Insulin Detemir (LEVEMIR FLEXTOUCH [place]) Inject 30-35 Units into the skin 2 (two) times daily. Takes 30 units in the morning and 35 units at night    . Insulin Pen Needle 32G X 4 MM MISC Use 8 pen needles per day 250 each 2  . Lancets (FREESTYLE) lancets Use 5/day 100 each 12  . levothyroxine (SYNTHROID, LEVOTHROID) 75 MCG tablet Take 75 mcg by mouth daily.      . Liraglutide (VICTOZA) 18 MG/3ML SOPN (1.2 MG) DAILY AS DIRECTED 6 mL 3  . lurasidone (LATUDA) 80 MG TABS tablet Take 160 mg by mouth daily with breakfast.    . meclizine (ANTIVERT) 25 MG tablet Take 1 tablet (25 mg total) by mouth 3 (three) times daily as needed. 30 tablet 0  . methotrexate (RHEUMATREX) 2.5 MG tablet Take 7.5 mg by mouth once a week. Pt takes 1 tablet once a week    . nitroGLYCERIN (NITROSTAT) 0.4 MG SL tablet Place 0.4 mg under the tongue every 5 (five) minutes as needed.    Marland Kitchen NOVOLOG FLEXPEN 100 UNIT/ML FlexPen INJECT 10 UNITS BEFORE BREAKFAST, LUNCH AND DINNER EVERY DAY. 15 mL 2  . OLANZapine (ZYPREXA) 5 MG tablet Take 5 mg by mouth at bedtime.    . Olmesartan-Amlodipine-HCTZ 40-10-12.5 MG TABS Take 1 tablet by mouth daily. 30 tablet 6  . ondansetron  (ZOFRAN) 8 MG tablet Take 1 tablet (8 mg total) by mouth every 8 (eight) hours as needed for nausea. 9 tablet 0  . ranitidine (ZANTAC) 150 MG capsule Take 150 mg by mouth every evening.        PAST MEDICAL HISTORY: Past Medical History  Diagnosis Date  . Thyroid disease   . Hypertension   . Ischemic colitis, enteritis, or enterocolitis (HCC)   . Sleep apnea   . Urinary bladder incontinence   . GERD (gastroesophageal reflux disease)   . Hyperlipidemia   . Diabetes mellitus     diet controlled  . Stroke (HCC)     x's 2  . Depression     Bipolar  . Vertigo     PAST SURGICAL HISTORY: Past Surgical History  Procedure Laterality Date  . Retinal detachment surgery    . Appendectomy    . Tonsillectomy    . Hemorrhoid  surgery    . Cholecystectomy    . Abdominal hysterectomy      partial     FAMILY HISTORY: Family History  Problem Relation Age of Onset  . Heart disease Mother   . Stroke Father   . Cancer Sister   . Cancer Brother   . Parkinson's disease Father     SOCIAL HISTORY:  Social History   Social History  . Marital Status: Widowed    Spouse Name: N/A  . Number of Children: 2  . Years of Education: 16   Occupational History  . Retired    Social History Main Topics  . Smoking status: Never Smoker   . Smokeless tobacco: Never Used  . Alcohol Use: No  . Drug Use: No  . Sexual Activity: Not on file   Other Topics Concern  . Not on file   Social History Narrative   Lives at home alone.   Right-handed.   No caffeine use.     PHYSICAL EXAM   Filed Vitals:   10/21/14 1209  BP: 100/65  Pulse: 68  Height:  (1.6 m)  Weight: 227 lb (102.967 kg)    Not recorded      Body mass index is 40.22 kg/(m^2).  PHYSICAL EXAMNIATION:  Gen: NAD, conversant, well nourised, obese, well groomed                     Cardiovascular: Regular rate rhythm, no peripheral edema, warm, nontender. Eyes: Conjunctivae clear without exudates or  hemorrhage Neck: Supple, no carotid bruise. Pulmonary: Clear to auscultation bilaterally   NEUROLOGICAL EXAM:  MENTAL STATUS: Speech:    Speech is normal; fluent and spontaneous with normal comprehension.  Cognition:     Orientation to time, place and person     Normal recent and remote memory     Normal Attention span and concentration     Normal Language, naming, repeating,spontaneous speech     Fund of knowledge   CRANIAL NERVES: CN II: Visual fields are full to confrontation. Fundoscopic exam is normal with sharp discs and no vascular changes. Pupils are round equal and briskly reactive to light. CN III, IV, VI: extraocular movement are normal. No ptosis. CN V: Facial sensation is intact to pinprick in all 3 divisions bilaterally. Corneal responses are intact.  CN VII: Face is symmetric with normal eye closure and smile. CN VIII: Hearing is normal to rubbing fingers CN IX, X: Palate elevates symmetrically. Phonation is normal. CN XI: Head turning and shoulder shrug are intact CN XII: Tongue is midline with normal movements and no atrophy.  MOTOR: She has bilateral hands postural tremor, head titubation. Muscle bulk and tone are normal. Muscle strength is normal.  REFLEXES: Reflexes are 2+ and symmetric at the biceps, triceps, knees, and ankles. Plantar responses are flexor.  SENSORY: Length dependent decreased to light touch, pinprick, and vibration sense to upper shin level   COORDINATION: Rapid alternating movements and fine finger movements are intact. There is no dysmetria on finger-to-nose and heel-knee-shin.    GAIT/STANCE: She has obesity, need to push up to get up from seated position, cautious, mildly unsteady gait,   DIAGNOSTIC DATA (LABS, IMAGING, TESTING) - I reviewed patient records, labs, notes, testing and imaging myself where available.  ASSESSMENT AND PLAN  [name] is a 74 y.o. female    Dizziness spells   Differentiation diagnosis  includes migraine variants, medicine side effect,   Improved with rehabilitation,  I encouraged  her continue to be physically active, exercise regularly. Essential tremor:  Diabetic peripheral neuropathy  I went over her medication list with her, clarify indication for each medications, She is to continue follow-up with her primary care physician, return to clinic for new issues.   Levert Feinstein, M.D. Ph.D.  [facility] [address] [place], [place] [address] Ph: [phone] Fax: [phone]  CC: To referring provider

## 2014-10-24 ENCOUNTER — Other Ambulatory Visit: Payer: Self-pay | Admitting: *Deleted

## 2014-10-24 MED ORDER — ONETOUCH ULTRA 2 W/DEVICE KIT: PACK | Status: DC

## 2014-11-03 ENCOUNTER — Other Ambulatory Visit: Payer: Self-pay | Admitting: Endocrinology

## 2014-11-08 ENCOUNTER — Other Ambulatory Visit: Payer: Self-pay | Admitting: Endocrinology

## 2014-11-25 ENCOUNTER — Encounter: Payer: Self-pay | Admitting: Cardiovascular Disease

## 2014-11-25 ENCOUNTER — Other Ambulatory Visit: Payer: Self-pay | Admitting: Endocrinology

## 2014-11-25 ENCOUNTER — Ambulatory Visit (INDEPENDENT_AMBULATORY_CARE_PROVIDER_SITE_OTHER): Payer: Medicare Other | Admitting: Cardiovascular Disease

## 2014-11-25 VITALS — BP 130/76 | HR 70 | Resp 16 | Ht 63.0 in | Wt 228.0 lb

## 2014-11-25 DIAGNOSIS — Z79899 Other long term (current) drug therapy: Secondary | ICD-10-CM

## 2014-11-25 DIAGNOSIS — E785 Hyperlipidemia, unspecified: Secondary | ICD-10-CM | POA: Diagnosis not present

## 2014-11-25 DIAGNOSIS — I10 Essential (primary) hypertension: Secondary | ICD-10-CM

## 2014-11-25 DIAGNOSIS — R0609 Other forms of dyspnea: Secondary | ICD-10-CM

## 2014-11-25 DIAGNOSIS — R0600 Dyspnea, unspecified: Secondary | ICD-10-CM | POA: Diagnosis not present

## 2014-11-25 MED ORDER — AMLODIPINE BESYLATE 10 MG PO TABS: 10.0000 mg | ORAL_TABLET | Freq: Every day | ORAL | Status: DC

## 2014-11-25 MED ORDER — POTASSIUM CHLORIDE CRYS ER 20 MEQ PO TBCR: 20.0000 meq | EXTENDED_RELEASE_TABLET | Freq: Every day | ORAL | Status: DC

## 2014-11-25 MED ORDER — FUROSEMIDE 40 MG PO TABS: 80.0000 mg | ORAL_TABLET | Freq: Every day | ORAL | Status: DC

## 2014-11-25 MED ORDER — OLMESARTAN MEDOXOMIL 40 MG PO TABS: 40.0000 mg | ORAL_TABLET | Freq: Every day | ORAL | Status: DC

## 2014-11-25 NOTE — Progress Notes (Signed)
Patient ID: LILIANNA CASE, female   DOB: 1940/10/10, 74 y.o.   MRN: [id number]     Cardiology Office Note   Date:  11/25/2014   ID:  HILDUR BAYER, DOB July 14, 1940, MRN [id number]  PCP:  Merrilee Seashore, MD  Cardiologist:   Sanda Klein, MD   Chief Complaint  Patient presents with  . Annual Exam  . Dizziness  . Shortness of Breath  . Edema      History of Present Illness: RHONDALYN CLINGAN is a 74 y.o. female who presents for complaints of lower extremity edema and exertional dyspnea. Walking to the mailbox, a distance of 200 yards, she has to stop and sit and rest at least once. Her dyspnea seems to have worsened over the last 3-4 months. In parallel, she has worsening edema. Her ankles are always swollen and never go down even after overnight recumbent position.  She has numerous risk factors for cardiac disease including hypertension, type 2 diabetes mellitus, hyperlipidemia, morbid obesity, obstructive sleep apnea on CPAP therapy. She also has previous history of transient ischemic attacks and ischemic colitis. Despite this echocardiography and nuclear stress testing last year showed no evidence of coronary insufficiency and showed normal left ventricular systolic function. There was evidence of mild diastolic dysfunction, but no overt evidence of elevated left atrial pressure by E/e' ratio and no evidence of vena cava dilation. Ultrasonography showed no evidence of renal artery stenosis.  After she developed problems with hyponatremia her hydrochlorothiazide was discontinued. She is still taking 5 different agents for hypertension, almost all of them vasodilators: olmesartan, amlodipine and diltiazem, hydralazine and bisoprolol.her blood pressure is well controlled.glycemic control has been good., A1c 7%. Her lipid profile is fairly good, LDL 75. Normal thyroid function.  Past Medical History  Diagnosis Date  . Thyroid disease   . Hypertension   . Ischemic colitis,  enteritis, or enterocolitis ([place])   . Sleep apnea   . Urinary bladder incontinence   . GERD (gastroesophageal reflux disease)   . Hyperlipidemia   . Diabetes mellitus     diet controlled  . Stroke ([place])     x's 2  . Depression     Bipolar  . Vertigo     Past Surgical History  Procedure Laterality Date  . Retinal detachment surgery    . Appendectomy    . Tonsillectomy    . Hemorrhoid surgery    . Fracture surgery    . Cholecystectomy    . Abdominal hysterectomy      partial      Current Outpatient Prescriptions  Medication Sig Dispense Refill  . aspirin 325 MG tablet Take 325 mg by mouth daily with breakfast.     . betamethasone dipropionate (DIPROLENE) 0.05 % cream Apply 1 application topically daily as needed (vaginal rash).    . bisoprolol (ZEBETA) 5 MG tablet Take 10 mg by mouth daily.     . Blood Glucose Monitoring Suppl (ONE TOUCH ULTRA 2) W/DEVICE KIT Use to check blood sugar 5 times per day Dx code E11.65 1 each 0  . carbamazepine (CARBATROL) 200 MG 12 hr capsule Take 200-400 mg by mouth 2 (two) times daily. Take 1 capsule (200 mg) daily with breakfast, take 2 capsules (400 mg) daily with supper    . Cholecalciferol (VITAMIN D) 2000 UNITS tablet Take 2,000 Units by mouth daily with breakfast.     . clobetasol cream (TEMOVATE) 7.19 % Apply 1 application topically daily as needed (skin rash).    . donepezil (  ARICEPT) 10 MG tablet Take 10 mg by mouth daily with supper.     . esomeprazole (NEXIUM) 40 MG capsule Take 40 mg by mouth daily with supper.    . folic acid (FOLVITE) 1 MG tablet Take 1 mg by mouth daily.    . furosemide (LASIX) 40 MG tablet Take 2 tablets (80 mg total) by mouth daily. 180 tablet 3  . gabapentin (NEURONTIN) 300 MG capsule TAKE 1 CAPSULE (300 MG TOTAL) BY MOUTH 3 (THREE) TIMES DAILY. 90 capsule 2  . glucose blood (FREESTYLE TEST STRIPS) test strip Use 5 times per day 100 each 12  . hydrALAZINE (APRESOLINE) 25 MG tablet Take 1 tablet (25 mg total) by  mouth 3 (three) times daily. Appointment needed for future refills 90 tablet 1  . Insulin Disposable Pump (V-GO 40) KIT Use one per day 1 kit 3  . Insulin Pen Needle 32G X 4 MM MISC Use 8 pen needles per day 250 each 2  . Lancets (FREESTYLE) lancets Use 5/day 100 each 12  . levothyroxine (SYNTHROID, LEVOTHROID) 75 MCG tablet Take 75 mcg by mouth daily.      . meclizine (ANTIVERT) 25 MG tablet Take 1 tablet (25 mg total) by mouth 3 (three) times daily as needed. (Patient taking differently: Take 25 mg by mouth 3 (three) times daily as needed for dizziness. ) 30 tablet 0  . methotrexate (RHEUMATREX) 2.5 MG tablet Take 5-7.5 mg by mouth once a week. Take 2 tablets (5 mg) every Monday, may take 3 tablets (7.5 mg) if a rash is present    . nitroGLYCERIN (NITROSTAT) 0.4 MG SL tablet Place 0.4 mg under the tongue every 5 (five) minutes as needed for chest pain.     Marland Kitchen NOVOLOG 100 UNIT/ML injection PT IS TO INJECT 78 UNITS PER DAY TO USE IN V-GO 30 mL 3  . NOVOLOG FLEXPEN 100 UNIT/ML FlexPen INJECT 10 UNITS BEFORE BREAKFAST, LUNCH AND DINNER EVERY DAY. 15 mL 1  . OLANZapine (ZYPREXA) 5 MG tablet Take 5 mg by mouth daily with supper.     . ondansetron (ZOFRAN) 8 MG tablet Take 1 tablet (8 mg total) by mouth every 8 (eight) hours as needed for nausea. (Patient taking differently: Take 8 mg by mouth 2 (two) times daily as needed for nausea. ) 9 tablet 0  . ranitidine (ZANTAC) 150 MG capsule Take 150 mg by mouth daily as needed for heartburn (acid reflux).     . solifenacin (VESICARE) 5 MG tablet Take 5 mg by mouth daily with breakfast.    . ziprasidone (GEODON) 40 MG capsule Take 80 mg by mouth daily with supper.    Marland Kitchen amLODipine (NORVASC) 10 MG tablet Take 1 tablet (10 mg total) by mouth daily. 90 tablet 3  . olmesartan (BENICAR) 40 MG tablet Take 1 tablet (40 mg total) by mouth daily. 90 tablet 3  . potassium chloride SA (K-DUR,KLOR-CON) 20 MEQ tablet Take 1 tablet (20 mEq total) by mouth daily. 90 tablet 3    . VICTOZA 18 MG/3ML SOPN INJECT (1.2 MG) DAILY AS DIRECTED 6 mL 2   No current facility-administered medications for this visit.    Allergies:   Flagyl; Ciprofloxacin; Metformin and related; Milk-related compounds; and Other    Social History:  The patient  reports that she has never smoked. She has never used smokeless tobacco. She reports that she does not drink alcohol or use illicit drugs.   Family History:  The patient's family history includes Cancer  in her brother and sister; Heart disease in her mother; Parkinson's disease in her father; Stroke in her father.    ROS:  Please see the history of present illness.    Otherwise, review of systems positive for none.   All other systems are reviewed and negative.    PHYSICAL EXAM: VS:  BP 130/76 mmHg  Pulse 70  Resp 16  Ht 5' 3"  (1.6 m)  Wt 228 lb (103.42 kg)  BMI 40.40 kg/m2 , BMI Body mass index is 40.4 kg/(m^2).  General: Alert, oriented x3, no distress Head: no evidence of trauma, PERRL, EOMI, no exophtalmos or lid lag, no myxedema, no xanthelasma; normal ears, nose and oropharynx Neck: 4-6 centimeters elevation injugular venous pulsations and prompt hepatojugular reflux; brisk carotid pulses without delay and no carotid bruits Chest: clear to auscultation, no signs of consolidation by percussion or palpation, normal fremitus, symmetrical and full respiratory excursions Cardiovascular: difficult to locate the apical impulse, regular rhythm, normal first and second heart sounds, no murmurs, rubs or gallops Abdomen: no tenderness or distention, no masses by palpation, no abnormal pulsatility or arterial bruits, normal bowel sounds, no hepatosplenomegaly Extremities: no clubbing, cyanosis; symmetrical 3+ hard pitting ankle and pretibial edema; 2+ radial, ulnar and brachial pulses bilaterally; unable to palpate pedal pulses due to edema; no subclavian or femoral bruits Neurological: grossly nonfocal Psych: euthymic mood, full  affect   EKG:  EKG is ordered today. The ekg ordered today demonstrates normal sinus rhythm, poor R-wave progression with leftward axis (previous EKG showed full-blown left anterior fascicular block), QTC normal at 399 ms   Recent Labs: 04/26/2014: ALT 20 08/21/2014: TSH 1.90 08/31/2014: BUN 14; Creatinine, Ser 0.95; Hemoglobin 11.9*; Platelets 314; Potassium 3.7; Sodium 132*    Lipid Panel    Component Value Date/Time   CHOL 153 04/26/2014 0923   TRIG 113.0 04/26/2014 0923   HDL 55.60 04/26/2014 0923   CHOLHDL 3 04/26/2014 0923   VLDL 22.6 04/26/2014 0923   LDLCALC 101 08/21/2014      Wt Readings from Last 3 Encounters:  11/25/14 228 lb (103.42 kg)  10/21/14 227 lb (102.967 kg)  10/11/14 231 lb 9.6 oz (105.053 kg)      Other studies Reviewed: Additional studies/ records that were reviewed today include: records from visits with her endocrinologist.   ASSESSMENT AND PLAN:  1. Diastolic heart failure, chronic - Mrs. Foresta has clear evidence of hypervolemia by exam, without evidence of proteinuria or liver abnormalities. She appears to have diastolic heart failure, although the findings on her echocardiogram were at best equivocal for elevated filling pressures. She has symptoms of both left heart failure (exertional dyspnea) and very prominent findings of right heart failure. Obesity is clearly a contributing factor, as his obstructive sleep apnea. We'll start loop diuretic furosemide 80 mg daily, watching her carefully for signs of hyponatremia or hypokalemia. Have asked her to weigh herself daily and bring a written log to her next appointment. Recheck her echocardiogram. There should be overt evidence of hypervolemia at this time if her swelling is indeed due to heart failure.  2. Obstructive sleep apnea reportedly compliant with CPAP  3. Morbid obesity, diabetes mellitus  4. Essential hypertension well controlled - I don't think she should be taking 2 different calcium  channel blockers and I have asked her to stop diltiazem. Her blood pressure control regimen is heavily reliant on vasodilators and these could be causing fluid retention/edema as a side effect. Unfortunately, she had hyponatremia with thiazide diuretics.  Consider spironolactone.  5. Despite multiple coronary risk factors she does not have angina pectoris and had a normal perfusion pattern on nuclear testing.  Ultimately if we cannot clarify the cause of her edema and dyspnea and if we cannot relieve her symptoms with diuretic therapy she should undergo radical left heart catheterization to clarify the diagnosis.    Current medicines are reviewed at length with the patient today.  The patient does not have concerns regarding medicines.  The following changes have been made:  Start furosemide 80 mg daily and potassium chloride 20 mEq daily. Stop diltiazem.  Labs/ tests ordered today include:   Orders Placed This Encounter  Procedures  . Basic metabolic panel  . EKG 12-Lead  . ECHOCARDIOGRAM COMPLETE      Patient Instructions  Your physician has recommended you make the following change in your medication:   STOP CARTIA (DILTIAZEM).  INCREASE FUROSEMIDE TO 86 MG DAILY  START POTASSIUM [place] physician has requested that you have an echocardiogram. Echocardiography is a painless test that uses sound waves to create images of your heart. It provides your doctor with information about the size and shape of your heart and how well your heart's chambers and valves are working. This procedure takes approximately one hour. There are no restrictions for this procedure.  Your physician recommends that you return for lab work in: ONE WEEK AT [facility]  Dr. Sallyanne Kuster recommends that you schedule a follow-up appointment in: 3-4 WEEKS          Signed, Sanda Klein, MD  11/25/2014 7:02 PM    Sanda Klein, MD, [facility] [phone] office [phone]  pager  .

## 2014-11-25 NOTE — Patient Instructions (Signed)
Your physician has recommended you make the following change in your medication:   STOP CARTIA (DILTIAZEM).  INCREASE FUROSEMIDE TO 80 MG DAILY  START POTASSIUM 20 MEQ DAILY  Your physician has requested that you have an echocardiogram. Echocardiography is a painless test that uses sound waves to create images of your heart. It provides your doctor with information about the size and shape of your heart and how well your heart's chambers and valves are working. This procedure takes approximately one hour. There are no restrictions for this procedure.  Your physician recommends that you return for lab work in: ONE WEEK AT [facility]  Dr. Royann Shiversroitoru recommends that you schedule a follow-up appointment in: 3-4 WEEKS

## 2014-12-05 ENCOUNTER — Encounter: Payer: Self-pay | Admitting: Cardiovascular Disease

## 2014-12-06 ENCOUNTER — Ambulatory Visit (INDEPENDENT_AMBULATORY_CARE_PROVIDER_SITE_OTHER): Payer: Medicare Other | Admitting: Ophthalmology

## 2014-12-06 ENCOUNTER — Other Ambulatory Visit: Payer: Medicare Other

## 2014-12-06 DIAGNOSIS — E113293 Type 2 diabetes mellitus with mild nonproliferative diabetic retinopathy without macular edema, bilateral: Secondary | ICD-10-CM | POA: Diagnosis not present

## 2014-12-06 DIAGNOSIS — H35033 Hypertensive retinopathy, bilateral: Secondary | ICD-10-CM | POA: Diagnosis not present

## 2014-12-06 DIAGNOSIS — I10 Essential (primary) hypertension: Secondary | ICD-10-CM | POA: Diagnosis not present

## 2014-12-06 DIAGNOSIS — H338 Other retinal detachments: Secondary | ICD-10-CM | POA: Diagnosis not present

## 2014-12-06 DIAGNOSIS — E11319 Type 2 diabetes mellitus with unspecified diabetic retinopathy without macular edema: Secondary | ICD-10-CM

## 2014-12-06 DIAGNOSIS — H43813 Vitreous degeneration, bilateral: Secondary | ICD-10-CM

## 2014-12-09 ENCOUNTER — Ambulatory Visit (HOSPITAL_COMMUNITY): Payer: Medicare Other | Attending: Cardiology

## 2014-12-09 ENCOUNTER — Other Ambulatory Visit: Payer: Self-pay

## 2014-12-09 DIAGNOSIS — R0600 Dyspnea, unspecified: Secondary | ICD-10-CM

## 2014-12-09 DIAGNOSIS — I351 Nonrheumatic aortic (valve) insufficiency: Secondary | ICD-10-CM | POA: Diagnosis not present

## 2014-12-09 DIAGNOSIS — I517 Cardiomegaly: Secondary | ICD-10-CM | POA: Diagnosis not present

## 2014-12-09 DIAGNOSIS — I10 Essential (primary) hypertension: Secondary | ICD-10-CM | POA: Diagnosis not present

## 2014-12-09 DIAGNOSIS — E119 Type 2 diabetes mellitus without complications: Secondary | ICD-10-CM | POA: Diagnosis not present

## 2014-12-11 ENCOUNTER — Ambulatory Visit: Payer: Medicare Other | Admitting: Endocrinology

## 2014-12-23 ENCOUNTER — Encounter: Payer: Self-pay | Admitting: Cardiovascular Disease

## 2014-12-23 ENCOUNTER — Ambulatory Visit (INDEPENDENT_AMBULATORY_CARE_PROVIDER_SITE_OTHER): Payer: Medicare Other | Admitting: Cardiovascular Disease

## 2014-12-23 ENCOUNTER — Telehealth: Payer: Self-pay | Admitting: Cardiovascular Disease

## 2014-12-23 ENCOUNTER — Other Ambulatory Visit: Payer: Medicare Other

## 2014-12-23 VITALS — BP 116/70 | HR 86 | Resp 20 | Ht 63.0 in | Wt 217.0 lb

## 2014-12-23 DIAGNOSIS — Z79899 Other long term (current) drug therapy: Secondary | ICD-10-CM

## 2014-12-23 DIAGNOSIS — I200 Unstable angina: Secondary | ICD-10-CM | POA: Diagnosis not present

## 2014-12-23 DIAGNOSIS — R072 Precordial pain: Secondary | ICD-10-CM

## 2014-12-23 DIAGNOSIS — E785 Hyperlipidemia, unspecified: Secondary | ICD-10-CM

## 2014-12-23 DIAGNOSIS — I10 Essential (primary) hypertension: Secondary | ICD-10-CM

## 2014-12-23 LAB — CBC
HCT: 39.4 % (ref 36.0–46.0)
HEMOGLOBIN: 13.9 g/dL (ref 12.0–15.0)
MCH: 31 pg (ref 26.0–34.0)
MCHC: 35.3 g/dL (ref 30.0–36.0)
MCV: 87.9 fL (ref 78.0–100.0)
MPV: 9.4 fL (ref 8.6–12.4)
PLATELETS: 341 10*3/uL (ref 150–400)
RBC: 4.48 MIL/uL (ref 3.87–5.11)
RDW: 14.2 % (ref 11.5–15.5)
WBC: 7.2 10*3/uL (ref 4.0–10.5)

## 2014-12-23 LAB — BASIC METABOLIC PANEL
BUN: 12 mg/dL (ref 7–25)
CALCIUM: 9.4 mg/dL (ref 8.6–10.4)
CO2: 27 mmol/L (ref 20–31)
CREATININE: 0.9 mg/dL (ref 0.60–0.93)
Chloride: 100 mmol/L (ref 98–110)
GLUCOSE: 70 mg/dL (ref 65–99)
Potassium: 4.7 mmol/L (ref 3.5–5.3)
SODIUM: 136 mmol/L (ref 135–146)

## 2014-12-23 NOTE — Patient Instructions (Signed)
Your physician has requested that you have a cardiac catheterization Tuesday  12/6, 2016. Cardiac catheterization is used to diagnose and/or treat various heart conditions. Doctors may recommend this procedure for a number of different reasons. The most common reason is to evaluate chest pain. Chest pain can be a symptom of coronary artery disease (CAD), and cardiac catheterization can show whether plaque is narrowing or blocking your heart's arteries. This procedure is also used to evaluate the valves, as well as measure the blood flow and oxygen levels in different parts of your heart. For further information please visit [email or web address] Please follow instruction sheet, as given.  Your physician recommends that you return for lab work in: TODAY AT [facility] 1ST FLOOR

## 2014-12-23 NOTE — Progress Notes (Signed)
Patient ID: [name], female   DOB: May 04, 1940, 74 y.o.   MRN: [id number]     Cardiology Office Note   Date:  12/23/2014   ID:  [name], DOB 03/16/1940, MRN [id number]  PCP:  Merrilee Seashore, MD  Cardiologist:   Sanda Klein, MD   Chief Complaint  Patient presents with  . Follow-up    c/o of heart pain  . Chest Pain    pt states she had chest pain yesterday pain rate 5  . Shortness of Breath    some SOB, some light headedness and dizziness  . Edema    edema both feet      History of Present Illness: [name] is a 74 y.o. female who presents for  Complaints of accelerating chest discomfort and dyspnea.   She went to [place] and stopped taking her diuretic since she was out on a boat. She developed substantial lower extremity edema and worsening dyspnea. This happened about a month ago. Subsequently after restarting her diuretic she has lost most of the swelling. She still has lower extremity edema however her weight is substantially lower than it was at previous appointments. It has varied between 209 and 217 lb on her home scale (today 215 pounds, 2 pounds less than our office scale). These weights are substantially lower than they have been throughout most of 2016 (225-231 lb).   she describes extreme exhaustion even when she is just bathing or clothing herself. The symptoms resolve after 5-15 minutes at rest. She feels some pressure in her chest, but clearly dyspnea is the dominant complaint. She also complains of diaphoresis and feeling clammy all the time.  An echocardiogram performed on November 21 , when she weighed 2 pounds more than she does today showed no evidence of elevated filling pressure.  Left ventricular systolic function was normal with EF 60-65% and normal regional wall motion , no serious valvular abnormalities. A year ago she had a normal nuclear perfusion study.   She has numerous risk factors for coronary disease including  insulin-requiring diabetes mellitus , hypertension and hyperlipidemia. She has normal renal function.  No evidence of significant proteinuria on labs performed in September.  Last A1c 7%, last LDL 101 (both performed in August). Other significant medical problems include obesity, obstructive sleep apnea on CPAP, history of previous TIA and ischemic colitis. She has a chronic left anterior fascicular block.    Past Medical History  Diagnosis Date  . Thyroid disease   . Hypertension   . Ischemic colitis, enteritis, or enterocolitis ([place])   . Sleep apnea   . Urinary bladder incontinence   . GERD (gastroesophageal reflux disease)   . Hyperlipidemia   . Diabetes mellitus     diet controlled  . Stroke ([place])     x's 2  . Depression     Bipolar  . Vertigo     Past Surgical History  Procedure Laterality Date  . Retinal detachment surgery    . Appendectomy    . Tonsillectomy    . Hemorrhoid surgery    . Fracture surgery    . Cholecystectomy    . Abdominal hysterectomy      partial      Current Outpatient Prescriptions  Medication Sig Dispense Refill  . amLODipine (NORVASC) 10 MG tablet Take 1 tablet (10 mg total) by mouth daily. 90 tablet 3  . aspirin 325 MG tablet Take 325 mg by mouth daily with breakfast.     . betamethasone dipropionate (  DIPROLENE) 0.05 % cream Apply 1 application topically daily as needed (vaginal rash).    . Blood Glucose Monitoring Suppl (ONE TOUCH ULTRA 2) W/DEVICE KIT Use to check blood sugar 5 times per day Dx code E11.65 1 each 0  . carbamazepine (CARBATROL) 200 MG 12 hr capsule Take 200-400 mg by mouth 2 (two) times daily. Take 1 capsule (200 mg) daily with breakfast, take 2 capsules (400 mg) daily with supper    . Cholecalciferol (VITAMIN D) 2000 UNITS tablet Take 2,000 Units by mouth daily with breakfast.     . clobetasol cream (TEMOVATE) 5.49 % Apply 1 application topically daily as needed (skin rash).    . donepezil (ARICEPT) 10 MG tablet Take 10 mg  by mouth daily with supper.     . esomeprazole (NEXIUM) 40 MG capsule Take 40 mg by mouth daily with supper.    . folic acid (FOLVITE) 1 MG tablet Take 1 mg by mouth daily.    . furosemide (LASIX) 40 MG tablet Take 2 tablets (80 mg total) by mouth daily. 180 tablet 3  . gabapentin (NEURONTIN) 300 MG capsule TAKE 1 CAPSULE (300 MG TOTAL) BY MOUTH 3 (THREE) TIMES DAILY. 90 capsule 2  . glucose blood (FREESTYLE TEST STRIPS) test strip Use 5 times per day 100 each 12  . hydrALAZINE (APRESOLINE) 25 MG tablet Take 1 tablet (25 mg total) by mouth 3 (three) times daily. Appointment needed for future refills 90 tablet 1  . Insulin Disposable Pump (V-GO 40) KIT Use one per day 1 kit 3  . Insulin Pen Needle 32G X 4 MM MISC Use 8 pen needles per day 250 each 2  . Lancets (FREESTYLE) lancets Use 5/day 100 each 12  . levothyroxine (SYNTHROID, LEVOTHROID) 75 MCG tablet Take 75 mcg by mouth daily.      . meclizine (ANTIVERT) 25 MG tablet Take 1 tablet (25 mg total) by mouth 3 (three) times daily as needed. (Patient taking differently: Take 25 mg by mouth 3 (three) times daily as needed for dizziness. ) 30 tablet 0  . methotrexate (RHEUMATREX) 2.5 MG tablet Take 5-7.5 mg by mouth once a week. Take 2 tablets (5 mg) every Monday, may take 3 tablets (7.5 mg) if a rash is present    . nitroGLYCERIN (NITROSTAT) 0.4 MG SL tablet Place 0.4 mg under the tongue every 5 (five) minutes as needed for chest pain.     Marland Kitchen NOVOLOG FLEXPEN 100 UNIT/ML FlexPen INJECT 10 UNITS BEFORE BREAKFAST, LUNCH AND DINNER EVERY DAY. 15 mL 1  . olmesartan (BENICAR) 40 MG tablet Take 1 tablet (40 mg total) by mouth daily. 90 tablet 3  . ondansetron (ZOFRAN) 8 MG tablet Take 1 tablet (8 mg total) by mouth every 8 (eight) hours as needed for nausea. (Patient taking differently: Take 8 mg by mouth 2 (two) times daily as needed for nausea. ) 9 tablet 0  . Pancrelipase, Lip-Prot-Amyl, (ZENPEP) 25000 UNITS CPEP Take 25,000 tablets by mouth 3 (three)  times daily.    . potassium chloride SA (K-DUR,KLOR-CON) 20 MEQ tablet Take 1 tablet (20 mEq total) by mouth daily. 90 tablet 3  . Probiotic Product (PROBIOTIC DAILY PO) Take 1 tablet by mouth daily.    . ranitidine (ZANTAC) 150 MG capsule Take 150 mg by mouth daily as needed for heartburn (acid reflux).     . solifenacin (VESICARE) 5 MG tablet Take 5 mg by mouth daily with breakfast.    . VICTOZA 18 MG/3ML SOPN INJECT (  1.2 MG) DAILY AS DIRECTED 6 mL 2   No current facility-administered medications for this visit.    Allergies:   Flagyl; Ciprofloxacin; Metformin and related; Milk-related compounds; and Other    Social History:  The patient  reports that she has never smoked. She has never used smokeless tobacco. She reports that she does not drink alcohol or use illicit drugs.   Family History:  The patient's family history includes Cancer in her brother and sister; Heart disease in her mother; Parkinson's disease in her father; Stroke in her father.    ROS:  Please see the history of present illness.    Otherwise, review of systems positive for  Occasional dizziness and lightheadedness, no syncope no palpitations.   All other systems are reviewed and negative.    PHYSICAL EXAM: VS:  BP 116/70 mmHg  Pulse 86  Resp 20  Ht 5' 3"  (1.6 m)  Wt 217 lb (98.431 kg)  BMI 38.45 kg/m2 , BMI Body mass index is 38.45 kg/(m^2).  General: Alert, oriented x3, no distress Head: no evidence of trauma, PERRL, EOMI, no exophtalmos or lid lag, no myxedema, no xanthelasma; normal ears, nose and oropharynx Neck: normal jugular venous pulsations and no hepatojugular reflux; brisk carotid pulses without delay and no carotid bruits Chest: clear to auscultation, no signs of consolidation by percussion or palpation, normal fremitus, symmetrical and full respiratory excursions Cardiovascular: normal position and quality of the apical impulse, regular rhythm, normal first and second heart sounds, no murmurs,  rubs or gallops Abdomen: no tenderness or distention, no masses by palpation, no abnormal pulsatility or arterial bruits, normal bowel sounds, no hepatosplenomegaly Extremities: no clubbing, cyanosis;  Symmetrical 2+ edema of the feet and ankles; 2+ radial, ulnar and brachial pulses bilaterally; 2+ right femoral, posterior tibial and dorsalis pedis pulses; 2+ left femoral, posterior tibial and dorsalis pedis pulses; no subclavian or femoral bruits Neurological: grossly nonfocal Psych: euthymic mood, full affect   EKG:  EKG is ordered today. The ekg ordered today demonstrates  Normal sinus rhythm, left anterior fascicular block, T-wave inversion in leads 1 and aVL, no acute changes   Recent Labs: 04/26/2014: ALT 20 08/21/2014: TSH 1.90 08/31/2014: BUN 14; Creatinine, Ser 0.95; Hemoglobin 11.9*; Platelets 314; Potassium 3.7; Sodium 132*    Lipid Panel    Component Value Date/Time   CHOL 153 04/26/2014 0923   TRIG 113.0 04/26/2014 0923   HDL 55.60 04/26/2014 0923   CHOLHDL 3 04/26/2014 0923   VLDL 22.6 04/26/2014 0923   LDLCALC 101 08/21/2014      Wt Readings from Last 3 Encounters:  12/23/14 217 lb (98.431 kg)  11/25/14 228 lb (103.42 kg)  10/21/14 227 lb (102.967 kg)    ASSESSMENT AND PLAN:   despite a lower weight than at previous office visits , [name] is complaining of symptoms of class III exertional dyspnea and clearly has lower extremity edema. In addition she has chest discomfort with exertion in an accelerated pattern, possible unstable angina.   I have recommended that she undergo right and left heart catheterization as soon as possible , both to exclude an unstable coronary lesion and to better characterize her left heart filling pressures. There is discrepancy between the clinical findings would suggest hypervolemia and the echocardiogram which suggested normal filling pressures.  This procedure has been fully reviewed with the patient and written informed consent has  been obtained.     Current medicines are reviewed at length with the patient today.  The patient does  not have concerns regarding medicines.  The following changes have been made:  no change  Labs/ tests ordered today include:   Orders Placed This Encounter  Procedures  . APTT  . Protime-INR  . Basic metabolic panel  . CBC  . EKG 12-Lead  . LEFT AND RIGHT HEART CATHETERIZATION WITH CORONARY ANGIOGRAM    Patient Instructions  Your physician has requested that you have a cardiac catheterization Tuesday  12/6, 2016. Cardiac catheterization is used to diagnose and/or treat various heart conditions. Doctors may recommend this procedure for a number of different reasons. The most common reason is to evaluate chest pain. Chest pain can be a symptom of coronary artery disease (CAD), and cardiac catheterization can show whether plaque is narrowing or blocking your heart's arteries. This procedure is also used to evaluate the valves, as well as measure the blood flow and oxygen levels in different parts of your heart. For further information please visit [email or web address]. Please follow instruction sheet, as given.  Your physician recommends that you return for lab work in: TODAY AT [facility] LAB 1ST Othella Boyer, MD  12/23/2014 11:34 AM    Sanda Klein, MD, [facility] [phone] office [phone] pager

## 2014-12-23 NOTE — Telephone Encounter (Signed)
Note preprocedure labwork was only thing ordered prior to cath. Pt aware, aware of cath tomorrow, no further questions.

## 2014-12-23 NOTE — Telephone Encounter (Addendum)
Pt called in stating that Dr.C scheduled her for a Catheterization tomorrow and she says that a Chest CT was mentioned. She wanted to know if that order had been placed and where she needs to go to have it done. Please f/u with her  Thanks

## 2014-12-24 ENCOUNTER — Encounter (HOSPITAL_COMMUNITY): Payer: Self-pay | Admitting: *Deleted

## 2014-12-24 ENCOUNTER — Encounter (HOSPITAL_COMMUNITY)
Admission: RE | Disposition: A | Discharge status: Home / Self Care | Payer: Medicare Other | Source: Ambulatory Visit | Attending: Cardiovascular Disease

## 2014-12-24 ENCOUNTER — Ambulatory Visit (HOSPITAL_COMMUNITY)
Admission: RE | Admit: 2014-12-24 | Discharge: 2014-12-24 | Disposition: A | Discharge status: Home / Self Care | Payer: Medicare Other | Source: Ambulatory Visit | Attending: Cardiovascular Disease | Admitting: Cardiovascular Disease

## 2014-12-24 ENCOUNTER — Other Ambulatory Visit: Payer: Self-pay | Admitting: *Deleted

## 2014-12-24 DIAGNOSIS — Z79899 Other long term (current) drug therapy: Secondary | ICD-10-CM | POA: Diagnosis not present

## 2014-12-24 DIAGNOSIS — G4733 Obstructive sleep apnea (adult) (pediatric): Secondary | ICD-10-CM | POA: Diagnosis not present

## 2014-12-24 DIAGNOSIS — E785 Hyperlipidemia, unspecified: Secondary | ICD-10-CM | POA: Insufficient documentation

## 2014-12-24 DIAGNOSIS — E119 Type 2 diabetes mellitus without complications: Secondary | ICD-10-CM | POA: Insufficient documentation

## 2014-12-24 DIAGNOSIS — R079 Chest pain, unspecified: Secondary | ICD-10-CM

## 2014-12-24 DIAGNOSIS — R0609 Other forms of dyspnea: Secondary | ICD-10-CM | POA: Diagnosis present

## 2014-12-24 DIAGNOSIS — R0602 Shortness of breath: Secondary | ICD-10-CM

## 2014-12-24 DIAGNOSIS — R0789 Other chest pain: Secondary | ICD-10-CM | POA: Diagnosis not present

## 2014-12-24 DIAGNOSIS — R0600 Dyspnea, unspecified: Secondary | ICD-10-CM | POA: Diagnosis present

## 2014-12-24 DIAGNOSIS — Z8673 Personal history of transient ischemic attack (TIA), and cerebral infarction without residual deficits: Secondary | ICD-10-CM | POA: Insufficient documentation

## 2014-12-24 DIAGNOSIS — Z7982 Long term (current) use of aspirin: Secondary | ICD-10-CM | POA: Diagnosis not present

## 2014-12-24 DIAGNOSIS — Z794 Long term (current) use of insulin: Secondary | ICD-10-CM | POA: Diagnosis not present

## 2014-12-24 DIAGNOSIS — E669 Obesity, unspecified: Secondary | ICD-10-CM | POA: Insufficient documentation

## 2014-12-24 DIAGNOSIS — I200 Unstable angina: Secondary | ICD-10-CM | POA: Diagnosis not present

## 2014-12-24 DIAGNOSIS — I10 Essential (primary) hypertension: Secondary | ICD-10-CM | POA: Insufficient documentation

## 2014-12-24 HISTORY — PX: CARDIAC CATHETERIZATION: SHX172

## 2014-12-24 LAB — APTT: APTT: 34 s (ref 24–37)

## 2014-12-24 LAB — POCT I-STAT 3, VENOUS BLOOD GAS (G3P V)
Acid-base deficit: 2 mmol/L (ref 0.0–2.0)
BICARBONATE: 23.1 meq/L (ref 20.0–24.0)
O2 Saturation: 68 %
PCO2 VEN: 38.4 mmHg — AB (ref 45.0–50.0)
PO2 VEN: 36 mmHg (ref 30.0–45.0)
TCO2: 24 mmol/L (ref 0–100)
pH, Ven: 7.388 — ABNORMAL HIGH (ref 7.250–7.300)

## 2014-12-24 LAB — PROTIME-INR
INR: 1.02 (ref <1.50)
Prothrombin Time: 13.5 seconds (ref 11.6–15.2)

## 2014-12-24 LAB — GLUCOSE, CAPILLARY
Glucose-Capillary: 98 mg/dL (ref 65–99)
Glucose-Capillary: 94 mg/dL (ref 65–99)

## 2014-12-24 SURGERY — RIGHT/LEFT HEART CATH AND CORONARY ANGIOGRAPHY
Anesthesia: LOCAL

## 2014-12-24 MED ORDER — FENTANYL CITRATE (PF) 100 MCG/2ML IJ SOLN
INTRAMUSCULAR | Status: AC
  Filled 2014-12-24: qty 2

## 2014-12-24 MED ORDER — MIDAZOLAM HCL 2 MG/2ML IJ SOLN
INTRAMUSCULAR | Status: AC
  Filled 2014-12-24: qty 2

## 2014-12-24 MED ORDER — SODIUM CHLORIDE 0.9 % WEIGHT BASED INFUSION
3.0000 mL/kg/h | INTRAVENOUS | Status: DC
  Administered 2014-12-24: 3 mL/kg/h via INTRAVENOUS

## 2014-12-24 MED ORDER — ONDANSETRON HCL 4 MG/2ML IJ SOLN: 4.0000 mg | Freq: Four times a day (QID) | INTRAMUSCULAR | Status: DC | PRN

## 2014-12-24 MED ORDER — NITROGLYCERIN 1 MG/10 ML FOR IR/CATH LAB: INTRA_ARTERIAL | Status: DC | PRN

## 2014-12-24 MED ORDER — SODIUM CHLORIDE 0.9 % WEIGHT BASED INFUSION: 3.0000 mL/kg/h | INTRAVENOUS | Status: AC

## 2014-12-24 MED ORDER — NITROGLYCERIN 1 MG/10 ML FOR IR/CATH LAB
INTRA_ARTERIAL | Status: AC
  Filled 2014-12-24: qty 10

## 2014-12-24 MED ORDER — SODIUM CHLORIDE 0.9 % IJ SOLN: 3.0000 mL | Freq: Two times a day (BID) | INTRAMUSCULAR | Status: DC

## 2014-12-24 MED ORDER — SODIUM CHLORIDE 0.9 % IJ SOLN: 3.0000 mL | INTRAMUSCULAR | Status: DC | PRN

## 2014-12-24 MED ORDER — FENTANYL CITRATE (PF) 100 MCG/2ML IJ SOLN
INTRAMUSCULAR | Status: DC | PRN
  Administered 2014-12-24 (×2): 25 ug via INTRAVENOUS

## 2014-12-24 MED ORDER — HEPARIN (PORCINE) IN NACL 2-0.9 UNIT/ML-% IJ SOLN
INTRAMUSCULAR | Status: AC
  Filled 2014-12-24: qty 1000

## 2014-12-24 MED ORDER — MIDAZOLAM HCL 2 MG/2ML IJ SOLN
INTRAMUSCULAR | Status: DC | PRN
  Administered 2014-12-24 (×2): 1 mg via INTRAVENOUS
  Administered 2014-12-24: 2 mg via INTRAVENOUS

## 2014-12-24 MED ORDER — HEPARIN (PORCINE) IN NACL 2-0.9 UNIT/ML-% IJ SOLN
INTRAMUSCULAR | Status: DC | PRN
Start: 2014-12-24 — End: 2014-12-24
  Administered 2014-12-24: 17:00:00

## 2014-12-24 MED ORDER — LIDOCAINE HCL (PF) 1 % IJ SOLN
INTRAMUSCULAR | Status: AC
  Filled 2014-12-24: qty 30

## 2014-12-24 MED ORDER — SODIUM CHLORIDE 0.9 % IV SOLN: 250.0000 mL | INTRAVENOUS | Status: DC | PRN

## 2014-12-24 MED ORDER — SODIUM CHLORIDE 0.9 % WEIGHT BASED INFUSION
1.0000 mL/kg/h | INTRAVENOUS | Status: DC
  Administered 2014-12-24: 1 mL/kg/h via INTRAVENOUS

## 2014-12-24 MED ORDER — IOHEXOL 350 MG/ML SOLN
INTRAVENOUS | Status: DC | PRN
  Administered 2014-12-24: 40 mL via INTRAVENOUS

## 2014-12-24 MED ORDER — VERAPAMIL HCL 2.5 MG/ML IV SOLN
INTRAVENOUS | Status: AC
Start: 2014-12-24 — End: 2014-12-24
  Filled 2014-12-24: qty 2

## 2014-12-24 MED ORDER — ACETAMINOPHEN 325 MG PO TABS: 650.0000 mg | ORAL_TABLET | ORAL | Status: DC | PRN

## 2014-12-24 SURGICAL SUPPLY — 15 items
CATH BALLN WEDGE 5F 110CM (CATHETERS) ×2 IMPLANT
CATH INFINITI 5FR ANG PIGTAIL (CATHETERS) ×2 IMPLANT
CATH INFINITI 5FR JL4 (CATHETERS) ×2 IMPLANT
CATH INFINITI JR4 5F (CATHETERS) ×2 IMPLANT
CATH OPTITORQUE TIG 4.0 5F (CATHETERS) ×2 IMPLANT
GLIDESHEATH SLEND A-KIT 6F 22G (SHEATH) ×2 IMPLANT
KIT HEART LEFT (KITS) ×2 IMPLANT
PACK CARDIAC CATHETERIZATION (CUSTOM PROCEDURE TRAY) ×2 IMPLANT
SHEATH FAST CATH BRACH 5F 5CM (SHEATH) ×2 IMPLANT
SHEATH PINNACLE 5F 10CM (SHEATH) ×2 IMPLANT
SYR MEDRAD MARK V 150ML (SYRINGE) ×2 IMPLANT
TRANSDUCER W/STOPCOCK (MISCELLANEOUS) ×2 IMPLANT
TUBING CIL FLEX 10 FLL-RA (TUBING) ×2 IMPLANT
WIRE EMERALD 3MM-J .035X150CM (WIRE) ×2 IMPLANT
WIRE SAFE-T 1.5MM-J .035X260CM (WIRE) ×2 IMPLANT

## 2014-12-24 NOTE — H&P (View-Only) (Signed)
Patient ID: [name], female   DOB: [birth year], 74 y.o.   MRN: [id number]     Cardiology Office Note   Date:  12/23/2014   ID:  [name], DOB 04-10-1940, MRN [id number]  PCP:  Merrilee Seashore, MD  Cardiologist:   Sanda Klein, MD   Chief Complaint  Patient presents with  . Follow-up    c/o of heart pain  . Chest Pain    pt states she had chest pain yesterday pain rate 5  . Shortness of Breath    some SOB, some light headedness and dizziness  . Edema    edema both feet      History of Present Illness: [name] is a 74 y.o. female who presents for  Complaints of accelerating chest discomfort and dyspnea.   She went to [place] and stopped taking her diuretic since she was out on a boat. She developed substantial lower extremity edema and worsening dyspnea. This happened about a month ago. Subsequently after restarting her diuretic she has lost most of the swelling. She still has lower extremity edema however her weight is substantially lower than it was at previous appointments. It has varied between 209 and 217 lb on her home scale (today 215 pounds, 2 pounds less than our office scale). These weights are substantially lower than they have been throughout most of 2016 (225-231 lb).   she describes extreme exhaustion even when she is just bathing or clothing herself. The symptoms resolve after 5-15 minutes at rest. She feels some pressure in her chest, but clearly dyspnea is the dominant complaint. She also complains of diaphoresis and feeling clammy all the time.  An echocardiogram performed on November 21 , when she weighed 2 pounds more than she does today showed no evidence of elevated filling pressure.  Left ventricular systolic function was normal with EF 60-65% and normal regional wall motion , no serious valvular abnormalities. A year ago she had a normal nuclear perfusion study.   She has numerous risk factors for coronary disease including  insulin-requiring diabetes mellitus , hypertension and hyperlipidemia. She has normal renal function.  No evidence of significant proteinuria on labs performed in September.  Last A1c 7%, last LDL 101 (both performed in August). Other significant medical problems include obesity, obstructive sleep apnea on CPAP, history of previous TIA and ischemic colitis. She has a chronic left anterior fascicular block.    Past Medical History  Diagnosis Date  . Thyroid disease   . Hypertension   . Ischemic colitis, enteritis, or enterocolitis ([place])   . Sleep apnea   . Urinary bladder incontinence   . GERD (gastroesophageal reflux disease)   . Hyperlipidemia   . Diabetes mellitus     diet controlled  . Stroke ([place])     x's 2  . Depression     Bipolar  . Vertigo     Past Surgical History  Procedure Laterality Date  . Retinal detachment surgery    . Appendectomy    . Tonsillectomy    . Hemorrhoid surgery    . Fracture surgery    . Cholecystectomy    . Abdominal hysterectomy      partial      Current Outpatient Prescriptions  Medication Sig Dispense Refill  . amLODipine (NORVASC) 10 MG tablet Take 1 tablet (10 mg total) by mouth daily. 90 tablet 3  . aspirin 325 MG tablet Take 325 mg by mouth daily with breakfast.     . betamethasone dipropionate (  DIPROLENE) 0.05 % cream Apply 1 application topically daily as needed (vaginal rash).    . Blood Glucose Monitoring Suppl (ONE TOUCH ULTRA 2) W/DEVICE KIT Use to check blood sugar 5 times per day Dx code E11.65 1 each 0  . carbamazepine (CARBATROL) 200 MG 12 hr capsule Take 200-400 mg by mouth 2 (two) times daily. Take 1 capsule (200 mg) daily with breakfast, take 2 capsules (400 mg) daily with supper    . Cholecalciferol (VITAMIN D) 2000 UNITS tablet Take 2,000 Units by mouth daily with breakfast.     . clobetasol cream (TEMOVATE) 4.10 % Apply 1 application topically daily as needed (skin rash).    . donepezil (ARICEPT) 10 MG tablet Take 10 mg  by mouth daily with supper.     . esomeprazole (NEXIUM) 40 MG capsule Take 40 mg by mouth daily with supper.    . folic acid (FOLVITE) 1 MG tablet Take 1 mg by mouth daily.    . furosemide (LASIX) 40 MG tablet Take 2 tablets (80 mg total) by mouth daily. 180 tablet 3  . gabapentin (NEURONTIN) 300 MG capsule TAKE 1 CAPSULE (300 MG TOTAL) BY MOUTH 3 (THREE) TIMES DAILY. 90 capsule 2  . glucose blood (FREESTYLE TEST STRIPS) test strip Use 5 times per day 100 each 12  . hydrALAZINE (APRESOLINE) 25 MG tablet Take 1 tablet (25 mg total) by mouth 3 (three) times daily. Appointment needed for future refills 90 tablet 1  . Insulin Disposable Pump (V-GO 40) KIT Use one per day 1 kit 3  . Insulin Pen Needle 32G X 4 MM MISC Use 8 pen needles per day 250 each 2  . Lancets (FREESTYLE) lancets Use 5/day 100 each 12  . levothyroxine (SYNTHROID, LEVOTHROID) 75 MCG tablet Take 75 mcg by mouth daily.      . meclizine (ANTIVERT) 25 MG tablet Take 1 tablet (25 mg total) by mouth 3 (three) times daily as needed. (Patient taking differently: Take 25 mg by mouth 3 (three) times daily as needed for dizziness. ) 30 tablet 0  . methotrexate (RHEUMATREX) 2.5 MG tablet Take 5-7.5 mg by mouth once a week. Take 2 tablets (5 mg) every Monday, may take 3 tablets (7.5 mg) if a rash is present    . nitroGLYCERIN (NITROSTAT) 0.4 MG SL tablet Place 0.4 mg under the tongue every 5 (five) minutes as needed for chest pain.     Marland Kitchen NOVOLOG FLEXPEN 100 UNIT/ML FlexPen INJECT 10 UNITS BEFORE BREAKFAST, LUNCH AND DINNER EVERY DAY. 15 mL 1  . olmesartan (BENICAR) 40 MG tablet Take 1 tablet (40 mg total) by mouth daily. 90 tablet 3  . ondansetron (ZOFRAN) 8 MG tablet Take 1 tablet (8 mg total) by mouth every 8 (eight) hours as needed for nausea. (Patient taking differently: Take 8 mg by mouth 2 (two) times daily as needed for nausea. ) 9 tablet 0  . Pancrelipase, Lip-Prot-Amyl, (ZENPEP) 25000 UNITS CPEP Take 25,000 tablets by mouth 3 (three)  times daily.    . potassium chloride SA (K-DUR,KLOR-CON) 20 MEQ tablet Take 1 tablet (20 mEq total) by mouth daily. 90 tablet 3  . Probiotic Product (PROBIOTIC DAILY PO) Take 1 tablet by mouth daily.    . ranitidine (ZANTAC) 150 MG capsule Take 150 mg by mouth daily as needed for heartburn (acid reflux).     . solifenacin (VESICARE) 5 MG tablet Take 5 mg by mouth daily with breakfast.    . VICTOZA 18 MG/3ML SOPN INJECT (  1.2 MG) DAILY AS DIRECTED 6 mL 2   No current facility-administered medications for this visit.    Allergies:   Flagyl; Ciprofloxacin; Metformin and related; Milk-related compounds; and Other    Social History:  The patient  reports that she has never smoked. She has never used smokeless tobacco. She reports that she does not drink alcohol or use illicit drugs.   Family History:  The patient's family history includes Cancer in her brother and sister; Heart disease in her mother; Parkinson's disease in her father; Stroke in her father.    ROS:  Please see the history of present illness.    Otherwise, review of systems positive for  Occasional dizziness and lightheadedness, no syncope no palpitations.   All other systems are reviewed and negative.    PHYSICAL EXAM: VS:  BP 116/70 mmHg  Pulse 86  Resp 20  Ht 5' 3"  (1.6 m)  Wt 217 lb (98.431 kg)  BMI 38.45 kg/m2 , BMI Body mass index is 38.45 kg/(m^2).  General: Alert, oriented x3, no distress Head: no evidence of trauma, PERRL, EOMI, no exophtalmos or lid lag, no myxedema, no xanthelasma; normal ears, nose and oropharynx Neck: normal jugular venous pulsations and no hepatojugular reflux; brisk carotid pulses without delay and no carotid bruits Chest: clear to auscultation, no signs of consolidation by percussion or palpation, normal fremitus, symmetrical and full respiratory excursions Cardiovascular: normal position and quality of the apical impulse, regular rhythm, normal first and second heart sounds, no murmurs,  rubs or gallops Abdomen: no tenderness or distention, no masses by palpation, no abnormal pulsatility or arterial bruits, normal bowel sounds, no hepatosplenomegaly Extremities: no clubbing, cyanosis;  Symmetrical 2+ edema of the feet and ankles; 2+ radial, ulnar and brachial pulses bilaterally; 2+ right femoral, posterior tibial and dorsalis pedis pulses; 2+ left femoral, posterior tibial and dorsalis pedis pulses; no subclavian or femoral bruits Neurological: grossly nonfocal Psych: euthymic mood, full affect   EKG:  EKG is ordered today. The ekg ordered today demonstrates  Normal sinus rhythm, left anterior fascicular block, T-wave inversion in leads 1 and aVL, no acute changes   Recent Labs: 04/26/2014: ALT 20 08/21/2014: TSH 1.90 08/31/2014: BUN 14; Creatinine, Ser 0.95; Hemoglobin 11.9*; Platelets 314; Potassium 3.7; Sodium 132*    Lipid Panel    Component Value Date/Time   CHOL 153 04/26/2014 0923   TRIG 113.0 04/26/2014 0923   HDL 55.60 04/26/2014 0923   CHOLHDL 3 04/26/2014 0923   VLDL 22.6 04/26/2014 0923   LDLCALC 101 08/21/2014      Wt Readings from Last 3 Encounters:  12/23/14 217 lb (98.431 kg)  11/25/14 228 lb (103.42 kg)  10/21/14 227 lb (102.967 kg)    ASSESSMENT AND PLAN:   despite a lower weight than at previous office visits , [name] is complaining of symptoms of class III exertional dyspnea and clearly has lower extremity edema. In addition she has chest discomfort with exertion in an accelerated pattern, possible unstable angina.   I have recommended that she undergo right and left heart catheterization as soon as possible , both to exclude an unstable coronary lesion and to better characterize her left heart filling pressures. There is discrepancy between the clinical findings would suggest hypervolemia and the echocardiogram which suggested normal filling pressures.  This procedure has been fully reviewed with the patient and written informed consent has  been obtained.     Current medicines are reviewed at length with the patient today.  The patient does  not have concerns regarding medicines.  The following changes have been made:  no change  Labs/ tests ordered today include:   Orders Placed This Encounter  Procedures  . APTT  . Protime-INR  . Basic metabolic panel  . CBC  . EKG 12-Lead  . LEFT AND RIGHT HEART CATHETERIZATION WITH CORONARY ANGIOGRAM    Patient Instructions  Your physician has requested that you have a cardiac catheterization Tuesday  12/6, 2016. Cardiac catheterization is used to diagnose and/or treat various heart conditions. Doctors may recommend this procedure for a number of different reasons. The most common reason is to evaluate chest pain. Chest pain can be a symptom of coronary artery disease (CAD), and cardiac catheterization can show whether plaque is narrowing or blocking your heart's arteries. This procedure is also used to evaluate the valves, as well as measure the blood flow and oxygen levels in different parts of your heart. For further information please visit [email or web address]. Please follow instruction sheet, as given.  Your physician recommends that you return for lab work in: TODAY AT [facility] 1ST Othella Boyer, MD  12/23/2014 11:34 AM    Sanda Klein, MD, [facility] [phone] office [phone] pager

## 2014-12-24 NOTE — Progress Notes (Addendum)
Site area: right groin a 5 french arterial sheath was removed  Site Prior to Removal:  Level 0  Pressure Applied For 15 MINUTES    Minutes Beginning at 1740p  Manual:   Yes.    Patient Status During Pull:  stable  Post Pull Groin Site:  Level 0  Post Pull Instructions Given:  Yes.    Post Pull Pulses Present:  Yes.    Dressing Applied:  Yes.    Comments:  VS remain stable during sheath pull

## 2014-12-24 NOTE — Progress Notes (Signed)
Pt walking the hall, escorted by nurse tech Sonja, pt denies pain, SOB, or chest pain.  Will continue to monitor patient.

## 2014-12-24 NOTE — Progress Notes (Signed)
Pt discharged to home, Vital signs stable as charted, pt no complaints,  no bleeding, bruising, or pain at right brachial and right groin cath incision site, IV removed, telemetry box removed.  Patient left floor in wheelchair, escorted by RN and son, with patient belongings, and discharge instruction sheet.

## 2014-12-24 NOTE — Progress Notes (Signed)
Site area: Right brachial a 5 french sheath was removed  Site Prior to Removal:  Level 0  Pressure Applied For 10 MINUTES    Minutes Beginning at 1730p  Manual:   Yes.    Patient Status During Pull:  stable  Post Pull Groin Site:  Level 0  Post Pull Instructions Given:  Yes.    Post Pull Pulses Present:  Yes.    Dressing Applied:  Yes.    Comments:  VS remain stable.  Pressures dressing applied.

## 2014-12-24 NOTE — Interval H&P Note (Signed)
History and Physical Interval Note:  12/24/2014 2:21 PM  Mary BignessShirley H Pablo  has presented today for surgery, with the diagnosis of Chest Pain  The various methods of treatment have been discussed with the patient and family. After consideration of risks, benefits and other options for treatment, the patient has consented to  Procedure(s): Right/Left Heart Cath and Coronary Angiography (N/A) as a surgical intervention .  The patient's history has been reviewed, patient examined, no change in status, stable for surgery.  I have reviewed the patient's chart and labs.  Questions were answered to the patient's satisfaction.     Errika Narvaiz

## 2014-12-24 NOTE — Op Note (Signed)
CARDIAC CATHETERIZATION REPORT   Procedures performed:  1. Left heart catheterization  2. Selective coronary angiography  3. Left ventriculography   Reason for procedure:  Exertional dyspnea Unexplained atypical chest pain   Procedure performed by: Thurmon FairMihai Danae Oland, MD, [facility]  Complications: none   Estimated blood loss: less than 5 mL   History:  74 year old woman with unexplained chest pain and dyspnea in an accelerating pattern, despite normal noninvasive evaluation.  Consent: The risks, benefits, and details of the procedure were explained to the patient. Risks including death, MI, stroke, bleeding, limb ischemia, renal failure and allergy were described and accepted by the patient. Informed written consent was obtained prior to proceeding.  Technique: The patient was brought to the cardiac catheterization laboratory in the fasting state. He was prepped and draped in the usual sterile fashion. Local anesthesia with 1% lidocaine was administered to the right wrist/groin area and left antecubital area.  The left antecubital IV was exchanged for a 13F venous sheath. Right heart pressures and PA O2 saturation were evaluated using a balloon tipped Swan-Ganz 13F catheter.  The radial artery was accessed twice and appropriate needle access location was confirmed with US, but the guidewire could not be passed. The right groin was anesthetized. Using the modified Seldinger technique a 5 French right common femoral artery sheath was introduced without difficulty. Under fluoroscopic guidance, using 5 French JL4 andJR catheters, selective cannulation of the left coronary artery, right coronary artery and left ventricle were respectively performed. Several coronary angiograms in a variety of projections were recorded. The aortic valve was crossed with the JR catheter, but a ventriculogram was not performed. Left ventricular pressure and a pull back to the aorta were recorded. No immediate complications  occurred. At the end of the procedure, all catheters were removed. After the procedure, hemostasis will be achieved with manual pressure.  PAWP    a 9, v 9, mean 7 mmHg PAP 29/8 mean 19 mm Hg CO 5.4  L/min. CI 2.8 L/min/m sq.  Contrast used: 40 mL Omnipaque  Angiographic Findings:  1. The left main coronary artery is free of significant atherosclerosis and bifurcates in the usual fashion into the left anterior descending artery and left circumflex coronary artery.  2. The left anterior descending artery is a large vessel that reaches the apex and generates 2major diagonal branches. There is evidence of no luminal irregularities and no calcification. No hemodynamically meaningful stenoses are seen. 3. The left circumflex coronary artery is a very large-size codominant vessel that generates 3 major oblique marginal arteries. There is evidence of no luminal irregularities and no calcification. No hemodynamically meaningful stenoses are seen. 4. The right coronary artery is a medium-size codominant vessel that generates Rv branches and a PDA. There is evidence of no luminal irregularities and no calcification. No hemodynamically meaningful stenoses are seen.  5. The left ventricle is not injected. Normal LVEF by echo and gated nuclear scintigram. There is no aortic valve stenosis by pullback. The left ventricular end-diastolic pressure is 16 mm H.g    IMPRESSIONS:  Normal coronary arteries. Normal hemodynamics/filling pressures.  RECOMMENDATION:  Evaluate for a non-cardiac cause of symptoms.  Thurmon FairMihai Alois Colgan, MD, [facility] [phone] office [phone] pager

## 2014-12-24 NOTE — Research (Signed)
CADLAD Informed Consent   Subject Name: [name]  Subject met inclusion and exclusion criteria.  The informed consent form, study requirements and expectations were reviewed with the subject and questions and concerns were addressed prior to the signing of the consent form.  The subject verbalized understanding of the trail requirements.  The subject agreed to participate in the CADLAD trial and signed the informed consent.  The informed consent was obtained prior to performance of any protocol-specific procedures for the subject.  A copy of the signed informed consent was given to the subject and a copy was placed in the subject's medical record.  Hedrick,Shantrice Rodenberg W 12/24/2014, 1455

## 2014-12-25 ENCOUNTER — Encounter (HOSPITAL_COMMUNITY): Payer: Self-pay | Admitting: Cardiovascular Disease

## 2014-12-25 ENCOUNTER — Telehealth: Payer: Self-pay | Admitting: Cardiovascular Disease

## 2014-12-25 ENCOUNTER — Telehealth: Payer: Self-pay | Admitting: *Deleted

## 2014-12-25 DIAGNOSIS — R0600 Dyspnea, unspecified: Secondary | ICD-10-CM

## 2014-12-25 LAB — GLUCOSE, CAPILLARY: Glucose-Capillary: 102 mg/dL — ABNORMAL HIGH (ref 65–99)

## 2014-12-25 MED FILL — Verapamil HCl IV Soln 2.5 MG/ML: INTRAVENOUS | Qty: 2 | Status: AC

## 2014-12-25 MED FILL — Nitroglycerin IV Soln 100 MCG/ML in D5W: INTRA_ARTERIAL | Qty: 10 | Status: AC

## 2014-12-25 MED FILL — Lidocaine HCl Local Preservative Free (PF) Inj 1%: INTRAMUSCULAR | Qty: 30 | Status: AC

## 2014-12-25 NOTE — Telephone Encounter (Signed)
Yes we are in the process of doing that.

## 2014-12-25 NOTE — Telephone Encounter (Signed)
-----   Message from Thurmon FairMihai Croitoru, MD sent at 12/24/2014  5:26 PM EST ----- Normal cath, please refer to pulmonary specialist for exertional dyspnea. F/U with me in 3-4 months.

## 2014-12-25 NOTE — Telephone Encounter (Signed)
Calling to see if  Dr. Royann Shiversroitoru has or will refer her to a lung doctor. Per patient this was discussed on yesterday after her procedure . Please call   Thanks

## 2014-12-25 NOTE — Telephone Encounter (Signed)
Returned call to patient.Advised will send message to Dr.Croitoru.

## 2014-12-25 NOTE — Telephone Encounter (Signed)
Returned call to patient.Dr.Croitoru advised referral in process.

## 2014-12-25 NOTE — Discharge Summary (Signed)
Outpatient diagnostic right and left heart catheterization. This was performed without complication , but due to the late hour at which the procedure was performed and the need for subsequent bedrest, the patient was admitted to the [facility] unit as short stay was closing.  Bed rest was unremarkable and uncomplicated. The patient was discharged home without any change in her medications.  Follow-up was arranged.

## 2014-12-26 ENCOUNTER — Ambulatory Visit: Payer: Medicare Other | Admitting: Endocrinology

## 2015-01-27 ENCOUNTER — Ambulatory Visit (INDEPENDENT_AMBULATORY_CARE_PROVIDER_SITE_OTHER): Payer: Medicare Other | Admitting: Internal Medicine

## 2015-01-27 ENCOUNTER — Other Ambulatory Visit (INDEPENDENT_AMBULATORY_CARE_PROVIDER_SITE_OTHER): Payer: Medicare Other

## 2015-01-27 ENCOUNTER — Ambulatory Visit (INDEPENDENT_AMBULATORY_CARE_PROVIDER_SITE_OTHER)
Admission: RE | Admit: 2015-01-27 | Discharge: 2015-01-27 | Disposition: A | Discharge status: Home / Self Care | Payer: Medicare Other | Source: Ambulatory Visit | Attending: Internal Medicine | Admitting: Internal Medicine

## 2015-01-27 ENCOUNTER — Encounter: Payer: Self-pay | Admitting: Internal Medicine

## 2015-01-27 ENCOUNTER — Other Ambulatory Visit: Payer: Self-pay | Admitting: Endocrinology

## 2015-01-27 VITALS — BP 138/82 | HR 76 | Ht 63.0 in | Wt 218.8 lb

## 2015-01-27 DIAGNOSIS — R0600 Dyspnea, unspecified: Secondary | ICD-10-CM

## 2015-01-27 LAB — BRAIN NATRIURETIC PEPTIDE: PRO B NATRI PEPTIDE: 26 pg/mL (ref 0.0–100.0)

## 2015-01-27 LAB — CBC WITH DIFFERENTIAL/PLATELET
BASOS ABS: 0 10*3/uL (ref 0.0–0.1)
BASOS PCT: 0.5 % (ref 0.0–3.0)
EOS ABS: 0.1 10*3/uL (ref 0.0–0.7)
Eosinophils Relative: 1.2 % (ref 0.0–5.0)
HEMATOCRIT: 42.2 % (ref 36.0–46.0)
Hemoglobin: 14 g/dL (ref 12.0–15.0)
LYMPHS PCT: 33.5 % (ref 12.0–46.0)
Lymphs Abs: 3 10*3/uL (ref 0.7–4.0)
MCHC: 33.3 g/dL (ref 30.0–36.0)
MCV: 90 fl (ref 78.0–100.0)
MONO ABS: 0.9 10*3/uL (ref 0.1–1.0)
Monocytes Relative: 9.8 % (ref 3.0–12.0)
Neutro Abs: 4.8 10*3/uL (ref 1.4–7.7)
Neutrophils Relative %: 55 % (ref 43.0–77.0)
Platelets: 415 10*3/uL — ABNORMAL HIGH (ref 150.0–400.0)
RBC: 4.68 Mil/uL (ref 3.87–5.11)
RDW: 13.8 % (ref 11.5–15.5)
WBC: 8.8 10*3/uL (ref 4.0–10.5)

## 2015-01-27 LAB — TSH: TSH: 2.39 u[IU]/mL (ref 0.35–4.50)

## 2015-01-27 LAB — SEDIMENTATION RATE: SED RATE: 25 mm/h — AB (ref 0–22)

## 2015-01-27 NOTE — Assessment & Plan Note (Signed)
Complicated by HBP/ DM   Body mass index is 38.77 kg/(m^2).  Lab Results  Component Value Date   TSH 1.90 08/21/2014     Contributing to gerd tendency/ doe/reviewed the need and the process to achieve and maintain neg calorie balance > defer f/u primary care including intermittently monitoring thyroid status

## 2015-01-27 NOTE — Patient Instructions (Signed)
Please remember to go to the lab and x-ray department downstairs for your tests - we will call you with the results when they are available.  Change Nexium 40 mg:  Take 30-60 min before first meal of the day pepcid ac 20 mg one at bedtime   GERD (REFLUX)  is an extremely common cause of respiratory symptoms just like yours , many times with no obvious heartburn at all.    It can be treated with medication, but also with lifestyle changes including elevation of the head of your bed (ideally with 6 inch  bed blocks),  Smoking cessation, avoidance of late meals, excessive alcohol, and avoid fatty foods, chocolate, peppermint, colas, red wine, and acidic juices such as orange juice.  NO MINT OR MENTHOL PRODUCTS SO NO COUGH DROPS  USE SUGARLESS CANDY INSTEAD (Jolley ranchers or Stover's or Life Savers) or even ice chips will also do - the key is to swallow to prevent all throat clearing. NO OIL BASED VITAMINS - use powdered substitutes.  Start slow walking and try to build up to 30 minutes daily   Please schedule a follow up office visit in 6 weeks, call sooner if needed with pfts

## 2015-01-27 NOTE — Assessment & Plan Note (Addendum)
01/27/2015  Walked RA x 1 lap @ 185 ft each stopped due to cp/ dizzy/ sob with nl sats at slow to moderate pace   Symptoms are markedly disproportionate to objective findings and not clear this is a lung problem but pt does appear to have difficult airway management issues. DDX of  difficult airways management almost all start with A and  include Adherence, Ace Inhibitors, Acid Reflux, Active Sinus Disease, Alpha 1 Antitripsin deficiency, Anxiety masquerading as Airways dz,  ABPA,  Allergy(esp in young), Aspiration (esp in elderly), Adverse effects of meds,  Active smokers, A bunch of PE's (a small clot burden can't cause this syndrome unless there is already severe underlying pulm or vascular dz with poor reserve) plus two Bs  = Bronchiectasis and Beta blocker use..and one C= CHF   Adherence is always the initial "prime suspect" and is a multilayered concern that requires a "trust but verify" approach in every patient - starting with knowing how to use medications, especially inhalers, correctly, keeping up with refills and understanding the fundamental difference between maintenance and prns vs those medications only taken for a very short course and then stopped and not refilled.   ? Acid (or non-acid) GERD > always difficult to exclude as up to 75% of pts in some series report no assoc GI/ Heartburn symptoms> rec max (24h)  acid suppression and diet restrictions/ reviewed and instructions given in writing.   ? Adverse effects of meds ? MTX (on it for itching/ not collagen vasc dz) but note she says was not on mtx until recently and the breathing problem had been progressing x months >> will have her return for pfts p 6 weeks on gerd rx   ? Anxiety/ depression/ deconditioning usually dx of exclusion but higher on her list noting actually appears more sob at rest than p walking.  ? A bunch of pe's > unlikely s PA pressures higher / D dimer nl - while  A nl valute  may miss small peripheral pe, the clot  burden with sob is moderately high and the d dimer has a very high neg pred value in this setting    ? CHF >  RHC/LHC reviewed from 12/24/14  :  lvedp 16-18 at rest so with exertion may go higher c/w diastolic dysfunction   I had an extended discussion with the patient reviewing all relevant studies completed to date and  lasting 35  minutes of a 60 minute visit    Each maintenance medication was reviewed in detail including most importantly the difference between maintenance and prns and under what circumstances the prns are to be triggered using an action plan format that is not reflected in the computer generated alphabetically organized AVS.    Please see instructions for details which were reviewed in writing and the patient given a copy highlighting the part that I personally wrote and discussed at today's ov.

## 2015-01-27 NOTE — Progress Notes (Signed)
Subjective:    Patient ID: [name], female    DOB: 1940-07-26,    MRN: [id number]  HPI  [id number] yowf never smoker never really aerobically inclined but active and able to work out at [employer] but stopped winter of 2016 and noted gradual onset of doe since summer of 2016 esp since November assoc with intermittent cp > cardiac eval neg by Croitoru > referred to pulmonary medicine clinic 01/27/2015    01/27/2015 1st [facility] office visit/ Yuval Rubens   Chief Complaint  Patient presents with  . PULMONARY CONSULT    Referred by Dr. Royann Shiversroitoru. Pt c/o increasing DOE that is worse over the past few months. Pt has been seen by cardiology and was cleared of any heart conditions. Pt denies any history of asthma or other lung conditions.   indolent onset progressive sob to point where aware of it even at rest/ not hs though on cpap then/ activity tol decreased x 6 m gradually worse to point can only walk 100 ft (not verified this ov) with variable sscp not necessarily directly related to sob with variable HB but s  assoc cough  No obvious  patterns in day to day or daytime variabilty or assoc pleuritic/ lateralizing  cp or   subjective wheeze or overt sinus  symptoms. No unusual exp hx or h/o childhood pna/ asthma or knowledge of premature birth.  Sleeping ok without nocturnal  or early am exacerbation  of respiratory  c/o's or need for noct saba. Also denies any obvious fluctuation of symptoms with weather or environmental changes or other aggravating or alleviating factors except as outlined above   Current Medications, Allergies, Complete Past Medical History, Past Surgical History, Family History, and Social History were [facility].    Review of Systems  Constitutional: Negative.  Negative for fever and unexpected weight change.  HENT: Positive for postnasal drip, rhinorrhea and sinus pressure. Negative for congestion, dental problem, ear pain,  nosebleeds, sneezing, sore throat and trouble swallowing.   Eyes: Positive for redness and itching.  Respiratory: Positive for chest tightness, shortness of breath and wheezing. Negative for cough.   Cardiovascular: Positive for palpitations and leg swelling.  Gastrointestinal: Negative.  Negative for nausea and vomiting.  Endocrine: Negative.   Genitourinary: Negative.  Negative for dysuria.  Musculoskeletal: Negative.  Negative for joint swelling.  Skin: Positive for rash.  Allergic/Immunologic: Positive for food allergies.  Neurological: Positive for headaches.  Hematological: Negative.  Does not bruise/bleed easily.  Psychiatric/Behavioral: Negative.  Negative for dysphoric mood. The patient is not nervous/anxious.        Objective:   Physical Exam  amb slt pale wf with sob at rest   Wt Readings from Last 3 Encounters:  01/27/15 218 lb 12.8 oz (99.247 kg)  12/24/14 200 lb 12 oz (91.06 kg)  12/23/14 217 lb (98.431 kg)    Vital signs reviewed   HEENT: nl dentition, turbinates, and oropharynx. Nl external ear canals without cough reflex   NECK :  without JVD/Nodes/TM/ nl carotid upstrokes bilaterally   LUNGS: no acc muscle use,  Nl contour chest which is clear to A and P bilaterally without cough on insp or exp maneuvers   CV:  RRR  no s3 or murmur or increase in P2, no edema   ABD:  soft and nontender with nl inspiratory excursion in the supine position. No bruits or organomegaly, bowel sounds nl  MS:  Nl gait/ ext warm without  deformities, calf tenderness, cyanosis or clubbing No obvious joint restrictions   SKIN: warm and dry without lesions    NEURO:  alert, approp, nl sensorium with  no motor deficits     CXR PA and Lateral:   01/27/2015 :    I personally reviewed images and agree with radiology impression as follows:   No acute cardiopulmonary process.   Labs ordered/ reviewed:      Chemistry      Component Value Date/Time   NA 136 12/23/2014 1059    K 4.7 12/23/2014 1059   CL 100 12/23/2014 1059   CO2 27 12/23/2014 1059   BUN 12 12/23/2014 1059   CREATININE 0.90 12/23/2014 1059   CREATININE 0.95 08/31/2014 1948   CREATININE 0.9 08/21/2014      Component Value Date/Time   CALCIUM 9.4 12/23/2014 1059   ALKPHOS 104 04/26/2014 0923   AST 17 04/26/2014 0923   ALT 20 04/26/2014 0923   BILITOT 0.3 04/26/2014 0923        Lab Results  Component Value Date   WBC 8.8 01/27/2015   HGB 14.0 01/27/2015   HCT 42.2 01/27/2015   MCV 90.0 01/27/2015   PLT 415.0* 01/27/2015     Lab Results  Component Value Date   DDIMER 0.32 01/27/2015      Lab Results  Component Value Date   TSH 2.39 01/27/2015     Lab Results  Component Value Date   PROBNP 26.0 01/27/2015       Lab Results  Component Value Date   ESRSEDRATE 25* 01/27/2015          Lab Results  Component Value Date   DDIMER 0.32 01/27/2015         Assessment & Plan:

## 2015-01-28 ENCOUNTER — Other Ambulatory Visit: Payer: Self-pay | Admitting: Endocrinology

## 2015-01-28 ENCOUNTER — Encounter: Payer: Self-pay | Admitting: Internal Medicine

## 2015-01-28 LAB — D-DIMER, QUANTITATIVE (NOT AT ARMC): D DIMER QUANT: 0.32 ug{FEU}/mL (ref 0.00–0.48)

## 2015-01-28 NOTE — Progress Notes (Signed)
Quick Note:  Called and spoke with pt. And discussed cxr results pt. Understood. Nothing further needed at this time. ______

## 2015-01-28 NOTE — Progress Notes (Signed)
Quick Note:  Called and spoke with pt. And discussed labs. Pt. Understood. Nothing further needed at this time. ______

## 2015-02-03 ENCOUNTER — Other Ambulatory Visit (INDEPENDENT_AMBULATORY_CARE_PROVIDER_SITE_OTHER): Payer: Medicare Other

## 2015-02-03 DIAGNOSIS — E1165 Type 2 diabetes mellitus with hyperglycemia: Secondary | ICD-10-CM | POA: Diagnosis not present

## 2015-02-03 DIAGNOSIS — IMO0002 Reserved for concepts with insufficient information to code with codable children: Secondary | ICD-10-CM

## 2015-02-03 LAB — COMPREHENSIVE METABOLIC PANEL
ALBUMIN: 3.7 g/dL (ref 3.5–5.2)
ALK PHOS: 108 U/L (ref 39–117)
ALT: 19 U/L (ref 0–35)
AST: 16 U/L (ref 0–37)
BILIRUBIN TOTAL: 0.2 mg/dL (ref 0.2–1.2)
BUN: 11 mg/dL (ref 6–23)
CALCIUM: 9 mg/dL (ref 8.4–10.5)
CO2: 25 mEq/L (ref 19–32)
CREATININE: 0.75 mg/dL (ref 0.40–1.20)
Chloride: 103 mEq/L (ref 96–112)
GFR: 80.34 mL/min (ref >60.00)
Glucose, Bld: 124 mg/dL — ABNORMAL HIGH (ref 70–99)
Potassium: 4.3 mEq/L (ref 3.5–5.1)
Sodium: 140 mEq/L (ref 135–145)
Total Protein: 6.6 g/dL (ref 6.0–8.3)

## 2015-02-03 LAB — HEMOGLOBIN A1C: Hgb A1c MFr Bld: 6.2 % (ref 4.6–6.5)

## 2015-02-06 ENCOUNTER — Encounter: Payer: Self-pay | Admitting: Endocrinology

## 2015-02-06 ENCOUNTER — Ambulatory Visit (INDEPENDENT_AMBULATORY_CARE_PROVIDER_SITE_OTHER): Payer: Medicare Other | Admitting: Endocrinology

## 2015-02-06 VITALS — BP 138/82 | HR 72 | Temp 97.7°F | Resp 16 | Ht 63.0 in | Wt 222.8 lb

## 2015-02-06 DIAGNOSIS — E119 Type 2 diabetes mellitus without complications: Secondary | ICD-10-CM

## 2015-02-06 NOTE — Progress Notes (Signed)
Patient ID: [name], female   DOB: [birth year], 75 y.o.   MRN: [id number]    Reason for Appointment: Followup for Type 2 Diabetes and swelling of legs  History of Present Illness:          Diagnosis: Type 2 diabetes mellitus, date of diagnosis:2011       Past history: Since she was apparently intolerant to metformin she was treated with Onglyza until about 2014  At that time because of increasing A1c of about 8% she was started on Levemir 15 units and this was aggressively increased Onglyza was continued She has not tried any other treatments for diabetes Her A1c has been 10-10.5 in 2015 In 3/15 she was told to start small doses of NovoLog at lunch and supper and add 15 units of Levemir at night also To control  hyperglycemia with her basal bolus insulin regimen she was given Victoza in addition on her initial consultation in 5/15  Recent history:   INSULIN regimen :  V-go pump 40 unit basal, boluses at meal times: 6-6-8 units  She was changed on 09/19/14 to the V-go pump instead of basal bolus insulin shots She has been on Victoza since 8/15 and she is taking 1.2 mg With the V-go pump her blood sugars have improved overall and are less variable  Current blood sugar patterns and problems:  Fasting blood sugars are very consistent, usually just above 100  She has stopped checking her blood sugars later in the day even though she was told to vary the times when she checks her sugar  She has no difficulty remembering to do her boluses with meals but usually does not change the amounts  She has improved her diet and is trying to  eliminate high glycemic index foods and juices  Recently trying to walk more regularly also  No hypoglycemia  She has no difficulty with using the V-go pump, doing the boluses when she is eating out and changing it every morning    Oral hypoglycemic drugs the patient is taking are: None    Side effects from medications have been:  Metformin: rash  Glucose monitoring:  done 1 time a day         Glucometer:  One Touch.      Blood Glucose readings from home glucose meter download:  FASTING readings recently: 90-147, usually under 130.  Her monitor has incorrect date and time programmed   Glycemic control:   A1c was 7  from PCP in 8/16  Lab Results  Component Value Date   HGBA1C 6.2 02/03/2015   HGBA1C 7.0* 08/21/2014   HGBA1C 6.8* 07/30/2014   Lab Results  Component Value Date   MICROALBUR 0.8 09/19/2014   LDLCALC 101 08/21/2014   CREATININE 0.75 02/03/2015    Self-care: The diet that the patient has been following is: tries to limit fats     Meals: 3 meals per day. Bfst oatmeal or egg/toast at 8-9 am, dinner 5 pm cutting back on frequent meals and  portions; no hs snacks           Exercise: Walks 15-20 min       Dietician visit: Most recent: 2014 ( class).               Compliance with the medical regimen: Good  Retinal exam: Most recent:.9/14    Weight history:  Wt Readings from Last 3 Encounters:  02/06/15 222 lb 12.8 oz (101.061 kg)  01/27/15 218  lb 12.8 oz (99.247 kg)  12/24/14 200 lb 12 oz (91.06 kg)      Medication List       This list is accurate as of: 02/06/15 11:16 AM.  Always use your most recent med list.               amLODipine 10 MG tablet  Commonly known as:  NORVASC  Take 1 tablet (10 mg total) by mouth daily.     aspirin 325 MG tablet  Take 325 mg by mouth daily with breakfast.     carbamazepine 200 MG 12 hr capsule  Commonly known as:  CARBATROL  Take 200-400 mg by mouth 2 (two) times daily. Take 1 capsule (200 mg) daily with breakfast, take 2 capsules (400 mg) daily with supper     donepezil 10 MG tablet  Commonly known as:  ARICEPT  Take 10 mg by mouth daily with supper.     esomeprazole 40 MG capsule  Commonly known as:  NEXIUM  Take 40 mg by mouth daily with supper.     folic acid 1 MG tablet  Commonly known as:  FOLVITE  Take 1 mg by mouth daily.       freestyle lancets  Use 5/day     furosemide 40 MG tablet  Commonly known as:  LASIX  Take 2 tablets (80 mg total) by mouth daily.     gabapentin 300 MG capsule  Commonly known as:  NEURONTIN  TAKE 1 CAPSULE (300 MG TOTAL) BY MOUTH 3 (THREE) TIMES DAILY.     glucose blood test strip  Commonly known as:  FREESTYLE TEST STRIPS  Use 5 times per day     hydrALAZINE 25 MG tablet  Commonly known as:  APRESOLINE  Take 25 mg by mouth daily.     insulin lispro 100 UNIT/ML injection  Commonly known as:  HUMALOG  Inject 78 Units into the skin once. With V-go pump     Insulin Pen Needle 32G X 4 MM Misc  Use 8 pen needles per day     levothyroxine 75 MCG tablet  Commonly known as:  SYNTHROID, LEVOTHROID  Take 75 mcg by mouth daily.     meclizine 25 MG tablet  Commonly known as:  ANTIVERT  Take 1 tablet (25 mg total) by mouth 3 (three) times daily as needed.     methotrexate 2.5 MG tablet  Commonly known as:  RHEUMATREX  Take 2.5 mg by mouth once a week. Take 3 every Monday per patient     nitroGLYCERIN 0.4 MG SL tablet  Commonly known as:  NITROSTAT  Place 0.4 mg under the tongue every 5 (five) minutes as needed for chest pain.     NOVOLOG FLEXPEN 100 UNIT/ML FlexPen  Generic drug:  insulin aspart  INJECT 10 UNITS BEFORE BREAKFAST, LUNCH AND DINNER EVERY DAY.     NOVOLOG 100 UNIT/ML injection  Generic drug:  insulin aspart  USE IN V-GO TO INJECT 78 UNITS PER DAY     OLANZapine 5 MG tablet  Commonly known as:  ZYPREXA  Take 5 mg by mouth at bedtime.     olmesartan 40 MG tablet  Commonly known as:  BENICAR  Take 1 tablet (40 mg total) by mouth daily.     ondansetron 8 MG tablet  Commonly known as:  ZOFRAN  Take 1 tablet (8 mg total) by mouth every 8 (eight) hours as needed for nausea.     ONE TOUCH ULTRA 2  w/Device Kit  Use to check blood sugar 5 times per day Dx code E11.65     potassium chloride SA 20 MEQ tablet  Commonly known as:  K-DUR,KLOR-CON  Take 1  tablet (20 mEq total) by mouth daily.     PROBIOTIC DAILY PO  Take 1 tablet by mouth daily.     solifenacin 5 MG tablet  Commonly known as:  VESICARE  Take 5 mg by mouth daily with breakfast.     V-GO 40 Kit  USE ONE PER DAY AS DIRECTED     VICTOZA 18 MG/3ML Sopn  Generic drug:  Liraglutide  INJECT (1.2 MG) DAILY AS DIRECTED     Vitamin D 2000 units tablet  Take 2,000 Units by mouth daily with breakfast.     ZENPEP 25000 units Cpep  Generic drug:  Pancrelipase (Lip-Prot-Amyl)  Take 25,000 tablets by mouth See admin instructions. Take 3 tablets by mouth morning noon and night        Allergies:  Allergies  Allergen Reactions  . Flagyl [Metronidazole Hcl] Itching and Rash  . Ciprofloxacin Itching and Rash  . Metformin And Related Rash  . Milk-Related Compounds Other (See Comments)    Stomach pains  . Other Other (See Comments)    [place] nuts cause severe facial redness    Past Medical History  Diagnosis Date  . Thyroid disease   . Hypertension   . Ischemic colitis, enteritis, or enterocolitis ([place])   . Sleep apnea   . Urinary bladder incontinence   . GERD (gastroesophageal reflux disease)   . Hyperlipidemia   . Diabetes mellitus     diet controlled  . Stroke ([place])     x's 2  . Depression     Bipolar  . Vertigo     Past Surgical History  Procedure Laterality Date  . Retinal detachment surgery    . Appendectomy    . Tonsillectomy    . Hemorrhoid surgery    . Fracture surgery    . Cholecystectomy    . Abdominal hysterectomy      partial   . Cardiac catheterization N/A 12/24/2014    Procedure: Right/Left Heart Cath and Coronary Angiography;  Surgeon: Sanda Klein, MD;  Location: [facility];  Service: Cardiovascular;  Laterality: N/A;    Family History  Problem Relation Age of Onset  . Heart disease Mother   . Stroke Father   . Cancer Sister   . Cancer Brother   . Parkinson's disease Father     Social History:  reports that she has never  smoked. She has never used smokeless tobacco. She reports that she does not drink alcohol or use illicit drugs.    Review of Systems   She has had less edema and takes Lasix regularly now  NEUROPATHY: She has lower leg pain and takes gabapentin twice a day for this, however does not have any symptoms      Lipids: Last LDL from PCP was 101, followed by PCP also        Lab Results  Component Value Date   CHOL 153 04/26/2014   HDL 55.60 04/26/2014   LDLCALC 101 08/21/2014   TRIG 113.0 04/26/2014   CHOLHDL 3 04/26/2014       Thyroid:   Has had presumed hypothyroidism for over 20 years, has not had a change in medications in several years and last TSH was  1.9 in August from PCP   Lab Results  Component Value Date  TSH 2.39 01/27/2015   TSH 1.90 08/21/2014   TSH 1.65 01/31/2014   FREET4 0.86 01/31/2014   FREET4 0.91 08/28/2013       The blood pressure has been controlled with a combination of amlodipine and Benicar HCT as well as hydralazine and bisoprolol  Blood pressure is recently much lower, she thinks it was 100/80 this morning She also feels somewhat lightheaded She does not have a follow-up appointment with her PCP next week   Physical Examination:  BP 138/82 mmHg  Pulse 72  Temp(Src) 97.7 F (36.5 C)  Resp 16  Ht _0  (1.6 m)  Wt 222 lb 12.8 oz (101.061 kg)  BMI 39.48 kg/m2  SpO2 95%    1+ pedal edema present. She does not look pale  ASSESSMENT/PLAN:   Diabetes type 2, uncontrolled   See history of present illness for detailed discussion of his current management, blood sugar patterns and problems identified Her blood sugars are excellent with the V-go pump and this has improved her compliance with mealtime insulin. Also has less variability with this A1c has come down further and is upper normal now at 6.2  She misunderstood and stopped checking her blood sugars nonfasting, discussed need for checking postprandial readings to help adjust her boluses  and monitor effects of various foods. Discussed blood sugar targets May adjust her boluses by one click based on what she is eating  She can reduce frequency of monitoring in the morning Her monitor was reset to  the correct daytime time She can switch Novolog to Humalog since her insurance will not cover Novolog now, should not make any difference in her level of control  Follow-up in 3 months again  NEUROPATHY: Symptomatically better and she can take gabapentin as needed only  HYPOTHYROIDISM: Her TSH is normal with 75 g and she will continue the same dose  Patient Instructions  Check blood sugars on waking up 2-3  times a week Also check blood sugars about 2 hours after a meal and do this after different meals by rotation  Recommended blood sugar levels on waking up is 90-130 and about 2 hours after meal is 130-160  Please bring your blood sugar monitor to each visit, thank you  Take gabapentin/Neurontin as needed only   Counseling time on subjects discussed above is over 50% of today's 25 minute visit   Kallan Bischoff 02/06/2015, 11:16 AM   Note: This office note was prepared with Estate agent. Any transcriptional errors that result from this process are unintentional.

## 2015-02-06 NOTE — Patient Instructions (Signed)
Check blood sugars on waking up 2-3  times a week Also check blood sugars about 2 hours after a meal and do this after different meals by rotation  Recommended blood sugar levels on waking up is 90-130 and about 2 hours after meal is 130-160  Please bring your blood sugar monitor to each visit, thank you  Take gabapentin/Neurontin as needed only

## 2015-02-21 ENCOUNTER — Other Ambulatory Visit: Payer: Self-pay | Admitting: Endocrinology

## 2015-03-01 ENCOUNTER — Other Ambulatory Visit: Payer: Self-pay | Admitting: Endocrinology

## 2015-03-04 ENCOUNTER — Other Ambulatory Visit: Payer: Self-pay

## 2015-03-04 DIAGNOSIS — Z1231 Encounter for screening mammogram for malignant neoplasm of breast: Secondary | ICD-10-CM

## 2015-03-04 DIAGNOSIS — Z803 Family history of malignant neoplasm of breast: Secondary | ICD-10-CM

## 2015-03-10 ENCOUNTER — Ambulatory Visit (INDEPENDENT_AMBULATORY_CARE_PROVIDER_SITE_OTHER): Payer: Medicare Other | Admitting: Internal Medicine

## 2015-03-10 ENCOUNTER — Encounter: Payer: Self-pay | Admitting: Internal Medicine

## 2015-03-10 VITALS — BP 108/60 | HR 74 | Ht 63.0 in | Wt 229.0 lb

## 2015-03-10 DIAGNOSIS — R0600 Dyspnea, unspecified: Secondary | ICD-10-CM

## 2015-03-10 LAB — PULMONARY FUNCTION TEST
DL/VA % PRED: 96 %
DL/VA: 4.51 ml/min/mmHg/L
DLCO UNC % PRED: 75 %
DLCO cor % pred: 76 %
DLCO cor: 17.49 ml/min/mmHg
DLCO unc: 17.27 ml/min/mmHg
FEF 25-75 PRE: 1.97 L/s
FEF 25-75 Post: 1.76 L/sec
FEF2575-%CHANGE-POST: -10 %
FEF2575-%Pred-Post: 106 %
FEF2575-%Pred-Pre: 118 %
FEV1-%CHANGE-POST: -3 %
FEV1-%PRED-PRE: 102 %
FEV1-%Pred-Post: 99 %
FEV1-PRE: 2.11 L
FEV1-Post: 2.04 L
FEV1FVC-%Change-Post: 3 %
FEV1FVC-%Pred-Pre: 107 %
FEV6-%Change-Post: -6 %
FEV6-%PRED-PRE: 100 %
FEV6-%Pred-Post: 94 %
FEV6-POST: 2.45 L
FEV6-PRE: 2.61 L
FEV6FVC-%PRED-POST: 105 %
FEV6FVC-%PRED-PRE: 105 %
FVC-%Change-Post: -6 %
FVC-%Pred-Post: 89 %
FVC-%Pred-Pre: 95 %
FVC-Post: 2.45 L
FVC-Pre: 2.61 L
POST FEV1/FVC RATIO: 83 %
POST FEV6/FVC RATIO: 100 %
PRE FEV6/FVC RATIO: 100 %
Pre FEV1/FVC ratio: 81 %
RV % PRED: 88 %
RV: 1.97 L
TLC % PRED: 93 %
TLC: 4.6 L

## 2015-03-10 NOTE — Assessment & Plan Note (Signed)
01/27/2015  Walked RA x 1 laps @ 185 ft each stopped due to cp/ dizzy/ sob with nl sats/  at slow to moderate pace   - 03/10/2015   Walked RA x one lap @ 185 stopped due to  Sob, light headed no desats  - PFT's 03/10/2015 wnl except ERV 45 c/w obesity effects   I had an extended final summary discussion with the patient and son Tawanna Cooler  reviewing all relevant studies completed to date and  lasting 15 to 20 minutes of a 25 minute visit on the following issues:    1) no physilogic cause for sob at rest and with exertion   2) no better on gerd rx so ok to d/c  3) can't do cpst which would be definitive if can't do but one lap here flat  4) rec more regular walking and f/u with primary care ? Needs more anxiolytic rx   5) Each maintenance medication was reviewed in detail including most importantly the difference between maintenance and as needed and under what circumstances the prns are to be used.  Please see instructions for details which were reviewed in writing and the patient given a copy.    6) pulmonary f/u can be prn

## 2015-03-10 NOTE — Patient Instructions (Addendum)
Ok to use cpap at naps  Keep walking and try to build up to 30 minutes daily   Try off acid suppression to see what difference if any it makes with the voice.   No pulmonary follow up needed

## 2015-03-10 NOTE — Assessment & Plan Note (Signed)
Complicated by HBP/  DM / pfts 03/10/2015 with low erv c/w obesity effects   Body mass index is 40.58 kg/(m^2).  Lab Results  Component Value Date   TSH 2.39 01/27/2015     Contributing to gerd tendency/ doe/reviewed the need and the process to achieve and maintain neg calorie balance > defer f/u primary care including intermittently monitoring thyroid status

## 2015-03-10 NOTE — Progress Notes (Signed)
Subjective:    Patient ID: [name], female    DOB: Mar 18, 1940,    MRN: [id number]    Brief patient profile: 58 yowf never smoker never really aerobically inclined but active and able to work out at [employer] but stopped winter of 2016 and noted gradual onset of doe since summer of 2016 esp since November 2016 assoc with intermittent cp > cardiac eval neg by [facility] > referred to pulmonary medicine clinic 01/27/2015    History of Present Illness  01/27/2015 1st [facility] office visit/ Sullivan Blasing   Chief Complaint  Patient presents with  . PULMONARY CONSULT    Referred by Dr. Royann Shivers. Pt c/o increasing DOE that is worse over the past few months. Pt has been seen by cardiology and was cleared of any heart conditions. Pt denies any history of asthma or other lung conditions.   indolent onset progressive sob to point where aware of it even at rest/ not hs though on cpap then/ activity tol decreased x 6 m gradually worse to point can only walk 100 ft (not verified this ov) with variable sscp not necessarily directly related to sob with variable HB but s  assoc cough rec Please remember to go to the lab and x-ray department downstairs for your tests - we will call you with the results when they are available. Change Nexium 40 mg:  Take 30-60 min before first meal of the day pepcid ac 20 mg one at bedtime  GERD (REFLUX)    Start slow walking and try to build up to 30 minutes daily    03/10/2015  f/u ov/[place] [facility] re: f/u dyspnea  With nl pfts  Chief Complaint  Patient presents with  . Follow-up    Pt states breathing is unchanged. She is walking daily for 30 min- has to stop and rest after 15-20 min. No new co's today.   walking at home outside on street x 15 min then stop, rest x 10 min and able to resume.  No obvious day to day or daytime variability or assoc chronic cough or cp or chest tightness, subjective wheeze or overt sinus or hb symptoms. No unusual exp hx or h/o childhood pna/  asthma or knowledge of premature birth.  Sleeping ok without nocturnal  or early am exacerbation  of respiratory  c/o's or need for noct saba. Also denies any obvious fluctuation of symptoms with weather or environmental changes or other aggravating or alleviating factors except as outlined above   Current Medications, Allergies, Complete Past Medical History, Past Surgical History, Family History, and Social History were reviewed in [employer] record.  ROS  The following are not active complaints unless bolded sore throat, dysphagia, dental problems, itching, sneezing,  nasal congestion or excess/ purulent secretions, ear ache,   fever, chills, sweats, unintended wt loss, classically pleuritic or exertional cp, hemoptysis,  orthopnea pnd or leg swelling, presyncope, palpitations, abdominal pain, anorexia, nausea, vomiting, diarrhea  or change in bowel or bladder habits, change in stools or urine, dysuria,hematuria,  rash, arthralgias, visual complaints, headache, numbness, weakness or ataxia or problems with walking or coordination,  change in mood/affect or memory.            Objective:   Physical Exam  amb wf appears to be hyperventilating at rest  "I feel light headed"    03/10/2015       229   01/27/15 218 lb 12.8 oz (99.247 kg)  12/24/14 200 lb 12 oz (  91.06 kg)  12/23/14 217 lb (98.431 kg)    Vital signs reviewed   HEENT: nl dentition, turbinates, and oropharynx. Nl external ear canals without cough reflex   NECK :  without JVD/Nodes/TM/ nl carotid upstrokes bilaterally   LUNGS: no acc muscle use,  Nl contour chest which is clear to A and P bilaterally without cough on insp or exp maneuvers   CV:  RRR  no s3 or murmur or increase in P2, no edema   ABD:  soft and nontender with nl inspiratory excursion in the supine position. No bruits or organomegaly, bowel sounds nl  MS:  Nl gait/ ext warm without deformities, calf tenderness, cyanosis or  clubbing No obvious joint restrictions   SKIN: warm and dry without lesions    NEURO:  alert, approp, nl sensorium with  no motor deficits     CXR PA and Lateral:   01/27/2015 :    I personally reviewed images and agree with radiology impression as follows:   No acute cardiopulmonary process.   Labs ordered/ reviewed:      Chemistry      Component Value Date/Time   NA 136 12/23/2014 1059   K 4.7 12/23/2014 1059   CL 100 12/23/2014 1059   CO2 27 12/23/2014 1059   BUN 12 12/23/2014 1059   CREATININE 0.90 12/23/2014 1059   CREATININE 0.95 08/31/2014 1948   CREATININE 0.9 08/21/2014      Component Value Date/Time   CALCIUM 9.4 12/23/2014 1059   ALKPHOS 104 04/26/2014 0923   AST 17 04/26/2014 0923   ALT 20 04/26/2014 0923   BILITOT 0.3 04/26/2014 0923        Lab Results  Component Value Date   WBC 8.8 01/27/2015   HGB 14.0 01/27/2015   HCT 42.2 01/27/2015   MCV 90.0 01/27/2015   PLT 415.0* 01/27/2015     Lab Results  Component Value Date   DDIMER 0.32 01/27/2015      Lab Results  Component Value Date   TSH 2.39 01/27/2015     Lab Results  Component Value Date   PROBNP 26.0 01/27/2015       Lab Results  Component Value Date   ESRSEDRATE 25* 01/27/2015                Assessment & Plan:

## 2015-03-10 NOTE — Progress Notes (Signed)
PFT done today. 

## 2015-03-18 ENCOUNTER — Other Ambulatory Visit: Payer: Self-pay | Admitting: *Deleted

## 2015-03-18 MED ORDER — INSULIN LISPRO 100 UNIT/ML ~~LOC~~ SOLN: 78.0000 [IU] | Freq: Once | SUBCUTANEOUS | Status: DC

## 2015-03-19 ENCOUNTER — Other Ambulatory Visit: Payer: Self-pay | Admitting: *Deleted

## 2015-03-19 ENCOUNTER — Telehealth: Payer: Self-pay | Admitting: Endocrinology

## 2015-03-19 MED ORDER — GLUCOSE BLOOD VI STRP: ORAL_STRIP | Status: DC

## 2015-03-19 MED ORDER — ONETOUCH DELICA LANCETS 33G MISC: Status: DC

## 2015-03-19 NOTE — Telephone Encounter (Signed)
rx sent

## 2015-03-19 NOTE — Telephone Encounter (Signed)
Pt said she needs prescription for the One Touch Delica and the Extra Fine 33 gauge needles called into [facility] on [address] at [phone]

## 2015-03-31 ENCOUNTER — Other Ambulatory Visit: Payer: Self-pay | Admitting: Cardiovascular Disease

## 2015-03-31 ENCOUNTER — Other Ambulatory Visit: Payer: Self-pay | Admitting: Endocrinology

## 2015-03-31 ENCOUNTER — Other Ambulatory Visit: Payer: Self-pay | Admitting: *Deleted

## 2015-03-31 NOTE — Telephone Encounter (Signed)
REFILL 

## 2015-04-02 ENCOUNTER — Other Ambulatory Visit: Payer: Self-pay | Admitting: Endocrinology

## 2015-05-14 ENCOUNTER — Other Ambulatory Visit (INDEPENDENT_AMBULATORY_CARE_PROVIDER_SITE_OTHER): Payer: Medicare Other

## 2015-05-14 ENCOUNTER — Ambulatory Visit
Admission: RE | Admit: 2015-05-14 | Discharge: 2015-05-14 | Disposition: A | Discharge status: Home / Self Care | Payer: Medicare Other | Source: Ambulatory Visit

## 2015-05-14 DIAGNOSIS — Z803 Family history of malignant neoplasm of breast: Secondary | ICD-10-CM

## 2015-05-14 DIAGNOSIS — E119 Type 2 diabetes mellitus without complications: Secondary | ICD-10-CM

## 2015-05-14 DIAGNOSIS — Z1231 Encounter for screening mammogram for malignant neoplasm of breast: Secondary | ICD-10-CM

## 2015-05-14 LAB — COMPREHENSIVE METABOLIC PANEL
ALBUMIN: 3.9 g/dL (ref 3.5–5.2)
ALT: 29 U/L (ref 0–35)
AST: 26 U/L (ref 0–37)
Alkaline Phosphatase: 96 U/L (ref 39–117)
BUN: 9 mg/dL (ref 6–23)
CALCIUM: 9 mg/dL (ref 8.4–10.5)
CHLORIDE: 97 meq/L (ref 96–112)
CO2: 28 meq/L (ref 19–32)
CREATININE: 0.72 mg/dL (ref 0.40–1.20)
GFR: 84.15 mL/min (ref >60.00)
GLUCOSE: 197 mg/dL — AB (ref 70–99)
Potassium: 4.8 mEq/L (ref 3.5–5.1)
SODIUM: 130 meq/L — AB (ref 135–145)
TOTAL PROTEIN: 6.6 g/dL (ref 6.0–8.3)
Total Bilirubin: 0.3 mg/dL (ref 0.2–1.2)

## 2015-05-14 LAB — HEMOGLOBIN A1C: Hgb A1c MFr Bld: 7.3 % — ABNORMAL HIGH (ref 4.6–6.5)

## 2015-05-19 ENCOUNTER — Ambulatory Visit (INDEPENDENT_AMBULATORY_CARE_PROVIDER_SITE_OTHER): Payer: Medicare Other | Admitting: Endocrinology

## 2015-05-19 VITALS — BP 124/70 | HR 69 | Temp 97.7°F | Resp 16 | Ht 63.0 in | Wt 232.0 lb

## 2015-05-19 DIAGNOSIS — Z794 Long term (current) use of insulin: Secondary | ICD-10-CM | POA: Diagnosis not present

## 2015-05-19 DIAGNOSIS — E871 Hypo-osmolality and hyponatremia: Secondary | ICD-10-CM | POA: Diagnosis not present

## 2015-05-19 DIAGNOSIS — E1165 Type 2 diabetes mellitus with hyperglycemia: Secondary | ICD-10-CM | POA: Diagnosis not present

## 2015-05-19 NOTE — Progress Notes (Signed)
Patient ID: [name], female   DOB: 1940-05-26, 75 y.o.   MRN: [id number]    Reason for Appointment: Followup for Type 2 Diabetes    History of Present Illness:          Diagnosis: Type 2 diabetes mellitus, date of diagnosis:2011       Past history: Since she was apparently intolerant to metformin she was treated with Onglyza until about 2014  At that time because of increasing A1c of about 8% she was started on Levemir 15 units and this was aggressively increased Onglyza was continued She has not tried any other treatments for diabetes Her A1c has been 10-10.5 in 2015 In 3/15 she was told to start small doses of NovoLog at lunch and supper and add 15 units of Levemir at night also To control  hyperglycemia with her basal bolus insulin regimen she was given Victoza in addition on her initial consultation in 5/15 She has been on Victoza since 8/15   Recent history:   INSULIN regimen :  V-go pump 40 unit basal, boluses at meal times: 6-6-6 units  Noninsulin hypoglycemic drugs the patient is taking are:  VICTOZA 1.2 mg  She was changed on 09/19/14 to the V-go pump instead of basal bolus insulin shots With the V-go pump her blood sugars have improved overall and are less variable  Current management, blood sugar patterns and problems:  Fasting blood sugars are significantly higher compared to her last visit when they were near normal  She now says that she is taking her pump off at bedtime and not clear why.  She thinks she was not understanding the need to keep it on when she is not doing boluses  She does not think she is having snacks at night  Her A1c has also gone up, previously 6.2, now 7.3  She has done only occasional readings after breakfast and some around lunchtime and afternoon but very infrequently and only once after supper  Her postprandial readings after breakfast are variable because of significantly variable carbohydrate intake for which she is  not adjusting her bolus doses  She is gradually gaining weight despite taking Victoza  No hypoglycemia  She has no difficulty with using the V-go pump, doing the boluses when she is eating out and applying it every morning     Side effects from medications have been: Metformin: rash  Glucose monitoring:  done 1 time a day         Glucometer:  One Touch.      Blood Glucose readings from home glucose meter download:  Mean values apply above for all meters except median for One Touch  PRE-MEAL Fasting Lunch Dinner Bedtime Overall  Glucose range: 116-192 109-135  133, 147     Mean/median: 159    153   POST-MEAL PC Breakfast PC Lunch PC Dinner  Glucose range: 79-216  135-174  131   Mean/median:        Glycemic control:     Lab Results  Component Value Date   HGBA1C 7.3* 05/14/2015   HGBA1C 6.2 02/03/2015   HGBA1C 7.0* 08/21/2014   Lab Results  Component Value Date   MICROALBUR 0.8 09/19/2014   LDLCALC 101 08/21/2014   CREATININE 0.72 05/14/2015    Self-care: The diet that the patient has been following is: tries to limit fats     Meals: 3 meals per day. Bfst oatmeal or egg or cereal at 8 am, dinner 5 pm.; no  hs snacks           Exercise: Walks 15-20 min       Dietician visit: Most recent: 2014 ( class).               Compliance with the medical regimen: Good  Retinal exam: Most recent:.9/14    Weight history:  Wt Readings from Last 3 Encounters:  05/19/15 232 lb (105.235 kg)  03/10/15 229 lb (103.874 kg)  02/06/15 222 lb 12.8 oz (101.061 kg)          Medication List       This list is accurate as of: 05/19/15 12:49 PM.  Always use your most recent med list.               amLODipine 10 MG tablet  Commonly known as:  NORVASC  Take 1 tablet (10 mg total) by mouth daily.     aspirin 325 MG tablet  Take 325 mg by mouth daily with breakfast.     carbamazepine 200 MG 12 hr capsule  Commonly known as:  CARBATROL  Take 200-400 mg by mouth 2 (two) times  daily. Take 1 capsule (200 mg) daily with breakfast, take 2 capsules (400 mg) daily with supper     CARTIA XT PO  Take 1 tablet by mouth daily.     donepezil 10 MG tablet  Commonly known as:  ARICEPT  Take 10 mg by mouth daily with supper.     esomeprazole 40 MG capsule  Commonly known as:  NEXIUM  Take 40 mg by mouth daily with supper.     folic acid 1 MG tablet  Commonly known as:  FOLVITE  Take 1 mg by mouth daily.     furosemide 40 MG tablet  Commonly known as:  LASIX  Take 2 tablets (80 mg total) by mouth daily.     gabapentin 300 MG capsule  Commonly known as:  NEURONTIN  TAKE 1 CAPSULE (300 MG TOTAL) BY MOUTH 3 (THREE) TIMES DAILY.     glucose blood test strip  Commonly known as:  ONE TOUCH ULTRA TEST  Use as instructed to check blood sugar 5 times per day dx code E11.65     hydrALAZINE 25 MG tablet  Commonly known as:  APRESOLINE  TAKE 1 TABLET (25 MG TOTAL) BY MOUTH 3 (THREE) TIMES DAILY. <PLEASE MAKE APPOINTMENT FOR REFILLS>     insulin lispro 100 UNIT/ML injection  Commonly known as:  HUMALOG  Inject 0.78 mLs (78 Units total) into the skin once. With V-go pump     Insulin Pen Needle 32G X 4 MM Misc  Use 8 pen needles per day     levothyroxine 75 MCG tablet  Commonly known as:  SYNTHROID, LEVOTHROID  Take 75 mcg by mouth daily.     meclizine 25 MG tablet  Commonly known as:  ANTIVERT  Take 1 tablet (25 mg total) by mouth 3 (three) times daily as needed.     methotrexate 2.5 MG tablet  Commonly known as:  RHEUMATREX  Take 2.5 mg by mouth once a week. Take 4 every Monday per patient     nitroGLYCERIN 0.4 MG SL tablet  Commonly known as:  NITROSTAT  Place 0.4 mg under the tongue every 5 (five) minutes as needed for chest pain.     OLANZapine 5 MG tablet  Commonly known as:  ZYPREXA  Take 5 mg by mouth at bedtime.     olmesartan 40 MG tablet  Commonly known as:  BENICAR  Take 1 tablet (40 mg total) by mouth daily.     ondansetron 8 MG tablet    Commonly known as:  ZOFRAN  Take 1 tablet (8 mg total) by mouth every 8 (eight) hours as needed for nausea.     ONE TOUCH ULTRA 2 w/Device Kit  Use to check blood sugar 5 times per day Dx code W09.81     [facility] DELICA LANCETS 19J Misc  Use to check blood sugar 5 times per day dx code E11.65     PROBIOTIC DAILY PO  Take 1 tablet by mouth daily. Reported on 05/19/2015     solifenacin 5 MG tablet  Commonly known as:  VESICARE  Take 5 mg by mouth daily with breakfast.     V-GO 40 Kit  USE ONE PER DAY AS DIRECTED     VICTOZA 18 MG/3ML Sopn  Generic drug:  Liraglutide  INJECT (1.2 MG) DAILY AS DIRECTED     Vitamin D 2000 units tablet  Take 2,000 Units by mouth daily with breakfast.     ZENPEP 25000 units Cpep  Generic drug:  Pancrelipase (Lip-Prot-Amyl)  Take 25,000 tablets by mouth See admin instructions. Take 3 tablets by mouth morning noon and night        Allergies:  Allergies  Allergen Reactions  . Flagyl [Metronidazole Hcl] Itching and Rash  . Ciprofloxacin Itching and Rash  . Metformin And Related Rash  . Milk-Related Compounds Other (See Comments)    Stomach pains  . Other Other (See Comments)    [place] nuts cause severe facial redness    Past Medical History  Diagnosis Date  . Thyroid disease   . Hypertension   . Ischemic colitis, enteritis, or enterocolitis ([place])   . Sleep apnea   . Urinary bladder incontinence   . GERD (gastroesophageal reflux disease)   . Hyperlipidemia   . Diabetes mellitus     diet controlled  . Stroke ([place])     x's 2  . Depression     Bipolar  . Vertigo     Past Surgical History  Procedure Laterality Date  . Retinal detachment surgery    . Appendectomy    . Tonsillectomy    . Hemorrhoid surgery    . Fracture surgery    . Cholecystectomy    . Abdominal hysterectomy      partial   . Cardiac catheterization N/A 12/24/2014    Procedure: Right/Left Heart Cath and Coronary Angiography;  Surgeon: Sanda Klein, MD;   Location: [place] [facility];  Service: Cardiovascular;  Laterality: N/A;    Family History  Problem Relation Age of Onset  . Heart disease Mother   . Stroke Father   . Cancer Sister   . Cancer Brother   . Parkinson's disease Father     Social History:  reports that she has never smoked. She has never used smokeless tobacco. She reports that she does not drink alcohol or use illicit drugs.    Review of Systems   She has had less edema and takes Lasix regularly now  NEUROPATHY: She has lower leg pain and takes gabapentin twice a day for this, however does not have any symptoms      Lipids: Last LDL from PCP was 101, followed by PCP also        Lab Results  Component Value Date   CHOL 153 04/26/2014   HDL 55.60 04/26/2014   LDLCALC 101 08/21/2014  TRIG 113.0 04/26/2014   CHOLHDL 3 04/26/2014       Thyroid:   Has had presumed hypothyroidism for over 20 years, has not had a change in medications in several years and last TSH was  1.9 in August from PCP   Lab Results  Component Value Date   TSH 2.39 01/27/2015   TSH 1.90 08/21/2014   TSH 1.65 01/31/2014   FREET4 0.86 01/31/2014   FREET4 0.91 08/28/2013       The blood pressure has been controlled with a combination of amlodipine and Benicar HCT as well as hydralazine and bisoprolol  Blood pressure is recently much lower, she thinks it was 100/80 this morning She also feels somewhat lightheaded She does not have a follow-up appointment with her PCP next week   Physical Examination:  BP 124/70 mmHg  Pulse 69  Temp(Src) 97.7 F (36.5 C)  Resp 16  Ht '5\' 3"'$  (1.6 m)  Wt 232 lb (105.235 kg)  BMI 41.11 kg/m2  SpO2 94%    1+ pedal edema present. She does not look pale  ASSESSMENT/PLAN:   Diabetes type 2, uncontrolled   See history of present illness for detailed discussion of his current management, blood sugar patterns and problems identified Her blood sugars are excellent with the V-go pump and this has  improved her compliance with mealtime insulin. Also has less variability with this A1c has come down further and is upper normal now at 6.2  She misunderstood and stopped checking her blood sugars nonfasting, discussed need for checking postprandial readings to help adjust her boluses and monitor effects of various foods. Discussed blood sugar targets May adjust her boluses by one click based on what she is eating  She can reduce frequency of monitoring in the morning Her monitor was reset to  the correct daytime time She can switch Novolog to Humalog since her insurance will not cover Novolog now, should not make any difference in her level of control  Follow-up in 3 months again  NEUROPATHY: Symptomatically better and she can take gabapentin as needed only  HYPOTHYROIDISM: Her TSH is normal with 75 g and she will continue the same dose  Patient Instructions  With eggs, 1 click only, with cereal 4 clicks  May need 4 clicks at supper   Keep pump on all nite, use 30 unit pump  Check blood sugars on waking up 3-4  times a week Also check blood sugars about 2 hours after a meal and do this after different meals by rotation  Recommended blood sugar levels on waking up is 90-130 and about 2 hours after meal is 130-160  Please bring your blood sugar monitor to each visit, thank you  Reduce fluids by 1/2     Counseling time on subjects discussed above is over 50% of today's 25 minute visit   Suzetta Timko 05/19/2015, 12:49 PM   Note: This office note was prepared with Dragon voice recognition system technology. Any transcriptional errors that result from this process are unintentional.                     Patient ID: [name], female   DOB: 09/18/1940, 75 y.o.   MRN: [id number]    Reason for Appointment: Followup for Type 2 Diabetes and swelling of legs  History of Present Illness:          Diagnosis: Type 2 diabetes mellitus, date of diagnosis:2011        Past history: Since  she was apparently intolerant to metformin she was treated with Onglyza until about 2014  At that time because of increasing A1c of about 8% she was started on Levemir 15 units and this was aggressively increased Onglyza was continued She has not tried any other treatments for diabetes Her A1c has been 10-10.5 in 2015 In 3/15 she was told to start small doses of NovoLog at lunch and supper and add 15 units of Levemir at night also To control  hyperglycemia with her basal bolus insulin regimen she was given Victoza in addition on her initial consultation in 5/15  Recent history:   INSULIN regimen :  V-go pump 40 unit basal, boluses at meal times: 6-6-8 units  She was changed on 09/19/14 to the V-go pump instead of basal bolus insulin shots She has been on Victoza since 8/15 and she is taking 1.2 mg With the V-go pump her blood sugars have improved overall and are less variable  Current blood sugar patterns and problems:  Fasting blood sugars are very consistent, usually just above 100  She has stopped checking her blood sugars later in the day even though she was told to vary the times when she checks her sugar  She has no difficulty remembering to do her boluses with meals but usually does not change the amounts  She has improved her diet and is trying to  eliminate high glycemic index foods and juices  Recently trying to walk more regularly also  No hypoglycemia  She has no difficulty with using the V-go pump, doing the boluses when she is eating out and changing it every morning    Oral hypoglycemic drugs the patient is taking are: None    Side effects from medications have been: Metformin: rash  Glucose monitoring:  done 1 time a day         Glucometer:  One Touch.      Blood Glucose readings from home glucose meter download:  FASTING readings recently: 90-147, usually under 130.  Her monitor has incorrect date and time programmed   Glycemic control:    A1c was 7  from PCP in 8/16  Lab Results  Component Value Date   HGBA1C 7.3* 05/14/2015   HGBA1C 6.2 02/03/2015   HGBA1C 7.0* 08/21/2014   Lab Results  Component Value Date   MICROALBUR 0.8 09/19/2014   LDLCALC 101 08/21/2014   CREATININE 0.72 05/14/2015    Self-care: The diet that the patient has been following is: tries to limit fats     Meals: 3 meals per day. Bfst oatmeal or egg/toast at 8-9 am, dinner 5 pm cutting back on frequent meals and  portions; no hs snacks           Exercise: Walks 15-20 min       Dietician visit: Most recent: 2014 ( class).               Compliance with the medical regimen: Good  Retinal exam: Most recent:.9/14    Weight history:  Wt Readings from Last 3 Encounters:  05/19/15 232 lb (105.235 kg)  03/10/15 229 lb (103.874 kg)  02/06/15 222 lb 12.8 oz (101.061 kg)      Medication List       This list is accurate as of: 05/19/15 12:49 PM.  Always use your most recent med list.               amLODipine 10 MG tablet  Commonly known as:  NORVASC  Take 1 tablet (10 mg total) by mouth daily.     aspirin 325 MG tablet  Take 325 mg by mouth daily with breakfast.     carbamazepine 200 MG 12 hr capsule  Commonly known as:  CARBATROL  Take 200-400 mg by mouth 2 (two) times daily. Take 1 capsule (200 mg) daily with breakfast, take 2 capsules (400 mg) daily with supper     CARTIA XT PO  Take 1 tablet by mouth daily.     donepezil 10 MG tablet  Commonly known as:  ARICEPT  Take 10 mg by mouth daily with supper.     esomeprazole 40 MG capsule  Commonly known as:  NEXIUM  Take 40 mg by mouth daily with supper.     folic acid 1 MG tablet  Commonly known as:  FOLVITE  Take 1 mg by mouth daily.     furosemide 40 MG tablet  Commonly known as:  LASIX  Take 2 tablets (80 mg total) by mouth daily.     gabapentin 300 MG capsule  Commonly known as:  NEURONTIN  TAKE 1 CAPSULE (300 MG TOTAL) BY MOUTH 3 (THREE) TIMES DAILY.     glucose  blood test strip  Commonly known as:  ONE TOUCH ULTRA TEST  Use as instructed to check blood sugar 5 times per day dx code E11.65     hydrALAZINE 25 MG tablet  Commonly known as:  APRESOLINE  TAKE 1 TABLET (25 MG TOTAL) BY MOUTH 3 (THREE) TIMES DAILY. <PLEASE MAKE APPOINTMENT FOR REFILLS>     insulin lispro 100 UNIT/ML injection  Commonly known as:  HUMALOG  Inject 0.78 mLs (78 Units total) into the skin once. With V-go pump     Insulin Pen Needle 32G X 4 MM Misc  Use 8 pen needles per day     levothyroxine 75 MCG tablet  Commonly known as:  SYNTHROID, LEVOTHROID  Take 75 mcg by mouth daily.     meclizine 25 MG tablet  Commonly known as:  ANTIVERT  Take 1 tablet (25 mg total) by mouth 3 (three) times daily as needed.     methotrexate 2.5 MG tablet  Commonly known as:  RHEUMATREX  Take 2.5 mg by mouth once a week. Take 4 every Monday per patient     nitroGLYCERIN 0.4 MG SL tablet  Commonly known as:  NITROSTAT  Place 0.4 mg under the tongue every 5 (five) minutes as needed for chest pain.     OLANZapine 5 MG tablet  Commonly known as:  ZYPREXA  Take 5 mg by mouth at bedtime.     olmesartan 40 MG tablet  Commonly known as:  BENICAR  Take 1 tablet (40 mg total) by mouth daily.     ondansetron 8 MG tablet  Commonly known as:  ZOFRAN  Take 1 tablet (8 mg total) by mouth every 8 (eight) hours as needed for nausea.     ONE TOUCH ULTRA 2 w/Device Kit  Use to check blood sugar 5 times per day Dx code W43.15     [facility] DELICA LANCETS 40G Misc  Use to check blood sugar 5 times per day dx code E11.65     PROBIOTIC DAILY PO  Take 1 tablet by mouth daily. Reported on 05/19/2015     solifenacin 5 MG tablet  Commonly known as:  VESICARE  Take 5 mg by mouth daily with breakfast.     V-GO 40 Kit  USE ONE PER DAY AS  DIRECTED     VICTOZA 18 MG/3ML Sopn  Generic drug:  Liraglutide  INJECT (1.2 MG) DAILY AS DIRECTED     Vitamin D 2000 units tablet  Take 2,000 Units by mouth  daily with breakfast.     ZENPEP 25000 units Cpep  Generic drug:  Pancrelipase (Lip-Prot-Amyl)  Take 25,000 tablets by mouth See admin instructions. Take 3 tablets by mouth morning noon and night        Allergies:  Allergies  Allergen Reactions  . Flagyl [Metronidazole Hcl] Itching and Rash  . Ciprofloxacin Itching and Rash  . Metformin And Related Rash  . Milk-Related Compounds Other (See Comments)    Stomach pains  . Other Other (See Comments)    [place] nuts cause severe facial redness    Past Medical History  Diagnosis Date  . Thyroid disease   . Hypertension   . Ischemic colitis, enteritis, or enterocolitis ([place])   . Sleep apnea   . Urinary bladder incontinence   . GERD (gastroesophageal reflux disease)   . Hyperlipidemia   . Diabetes mellitus     diet controlled  . Stroke ([place])     x's 2  . Depression     Bipolar  . Vertigo     Past Surgical History  Procedure Laterality Date  . Retinal detachment surgery    . Appendectomy    . Tonsillectomy    . Hemorrhoid surgery    . Fracture surgery    . Cholecystectomy    . Abdominal hysterectomy      partial   . Cardiac catheterization N/A 12/24/2014    Procedure: Right/Left Heart Cath and Coronary Angiography;  Surgeon: Sanda Klein, MD;  Location: [place] [facility];  Service: Cardiovascular;  Laterality: N/A;    Family History  Problem Relation Age of Onset  . Heart disease Mother   . Stroke Father   . Cancer Sister   . Cancer Brother   . Parkinson's disease Father     Social History:  reports that she has never smoked. She has never used smokeless tobacco. She reports that she does not drink alcohol or use illicit drugs.    Review of Systems   She has had less edema and takes Lasix most of the days   HYPONATREMIA: Her sodium is 130 and she is not on any Thiazide diuretics or other medications like SSRIs However she drinks 4 or more servings of 32 ounces water a day and other drinks  NEUROPATHY:  She has lower leg pain and takes gabapentin twice a day for this with relief      Lipids: Last LDL from PCP was 101, followed by PCP also        Lab Results  Component Value Date   CHOL 153 04/26/2014   HDL 55.60 04/26/2014   LDLCALC 101 08/21/2014   TRIG 113.0 04/26/2014   CHOLHDL 3 04/26/2014       Thyroid:   Has had presumed hypothyroidism for over 20 years, has not had a change in medications in several years and last TSH was  Normal   Lab Results  Component Value Date   TSH 2.39 01/27/2015   TSH 1.90 08/21/2014   TSH 1.65 01/31/2014   FREET4 0.86 01/31/2014   FREET4 0.91 08/28/2013       The blood pressure has been controlled with a combination of amlodipine and Benicar HCT as well as hydralazine andDiltiazem  Blood pressure is recently much lower, she thinks it was 100/80  this morning  Physical Examination:  BP 124/70 mmHg  Pulse 69  Temp(Src) 97.7 F (36.5 C)  Resp 16  Ht 5' 3"  (1.6 m)  Wt 232 lb (105.235 kg)  BMI 41.11 kg/m2  SpO2 94%    ASSESSMENT/PLAN:   Diabetes type 2, uncontrolled   See history of present illness for detailed discussion of current  management, blood sugar patterns and problems identified Her blood sugars are  much higher now, previously had an A1c of 6.2 Most likely this is related to her taking off the pump at night as she did not understand the need for intending to basal insulin overnight and most of her high readings are fasting Also will have some high readings after higher carbohydrate meals in the morning Checking blood sugars infrequently after meals and this was discussed  Since her blood sugars only averaging about 160 fasting may not need the 40 unit basal for the V-go pump Discussed needing to use the pump 24 hours She was given a sample of the 30 units V-go pump to try for a few days and let us know what her fasting readings are Most likely she may need at least 2 units more to cover her lunch and dinner meals and also  cereal in the morning but she will try to check more often to confirm this  HYPONATREMIA: She may have mild SIADH and advised her to cut back 50% on her fluid intake  Patient Instructions  With eggs, 1 click only, with cereal 4 clicks  May need 4 clicks at supper   Keep pump on all nite, use 30 unit pump  Check blood sugars on waking up 3-4  times a week Also check blood sugars about 2 hours after a meal and do this after different meals by rotation  Recommended blood sugar levels on waking up is 90-130 and about 2 hours after meal is 130-160  Please bring your blood sugar monitor to each visit, thank you  Reduce fluids by 1/2      Counseling time on subjects discussed above is over 50% of today's 25 minute visit   Trinitey Roache 05/19/2015, 12:49 PM   Note: This office note was prepared with Dragon voice recognition system technology. Any transcriptional errors that result from this process are unintentional.

## 2015-05-19 NOTE — Patient Instructions (Addendum)
With eggs, 1 click only, with cereal 4 clicks  May need 4 clicks at supper   Keep pump on all nite, use 30 unit pump  Check blood sugars on waking up 3-4  times a week Also check blood sugars about 2 hours after a meal and do this after different meals by rotation  Recommended blood sugar levels on waking up is 90-130 and about 2 hours after meal is 130-160  Please bring your blood sugar monitor to each visit, thank you  Reduce fluids by 1/2

## 2015-05-20 ENCOUNTER — Other Ambulatory Visit: Payer: Self-pay | Admitting: *Deleted

## 2015-05-20 MED ORDER — GABAPENTIN 300 MG PO CAPS: ORAL_CAPSULE | ORAL | Status: DC

## 2015-05-20 MED ORDER — LIRAGLUTIDE 18 MG/3ML ~~LOC~~ SOPN: PEN_INJECTOR | SUBCUTANEOUS | Status: DC

## 2015-05-20 MED ORDER — INSULIN PEN NEEDLE 32G X 4 MM MISC: Status: DC

## 2015-05-20 MED ORDER — INSULIN LISPRO 100 UNIT/ML ~~LOC~~ SOLN: 78.0000 [IU] | Freq: Once | SUBCUTANEOUS | Status: DC

## 2015-05-20 MED ORDER — V-GO 40 KIT: PACK | Status: DC

## 2015-05-23 ENCOUNTER — Telehealth: Payer: Self-pay | Admitting: Endocrinology

## 2015-05-23 NOTE — Telephone Encounter (Signed)
Requested a call back from the pt to discuss.  

## 2015-05-23 NOTE — Telephone Encounter (Signed)
Please call the pt she needs to discuss readings since the change in med

## 2015-05-26 ENCOUNTER — Telehealth: Payer: Self-pay | Admitting: Endocrinology

## 2015-05-26 NOTE — Telephone Encounter (Signed)
Please call pt after 1 pm today regarding the v- 30

## 2015-05-27 ENCOUNTER — Telehealth: Payer: Self-pay | Admitting: *Deleted

## 2015-05-27 NOTE — Telephone Encounter (Signed)
Please find out what she is using for basal and boluses now

## 2015-05-27 NOTE — Telephone Encounter (Signed)
Patient said her blood sugars are in the 130's in the am and between 240-270 at night. She's out of the V-go 30 and has been using the 40's.  Please advise

## 2015-05-29 NOTE — Telephone Encounter (Signed)
I left a message for her to return call with the information. 

## 2015-05-29 NOTE — Telephone Encounter (Signed)
If her sugars are not getting low with wearing the 40 unit V-go basal all night also she can continue this;  Probably will need to take 2-4 extra clicks at suppertime to keep blood sugars after supper below 180

## 2015-05-29 NOTE — Telephone Encounter (Signed)
I don't think this is what you wanted?

## 2015-05-29 NOTE — Telephone Encounter (Signed)
Patient stated her basal rate was v - 40 and she is taking Victoza.

## 2015-05-30 NOTE — Telephone Encounter (Signed)
Detailed message left on patient's voicemail.

## 2015-06-03 ENCOUNTER — Telehealth: Payer: Self-pay | Admitting: Cardiovascular Disease

## 2015-06-03 MED ORDER — HYDRALAZINE HCL 25 MG PO TABS: ORAL_TABLET | ORAL | Status: DC

## 2015-06-03 NOTE — Telephone Encounter (Signed)
New message     *STAT* If patient is at the pharmacy, call can be transferred to refill team.   1. Which medications need to be refilled? (please list name of each medication and dose if known) hyrdralize  25 mg   2. Which pharmacy/location (including street and city if local pharmacy) is medication to be sent to? [facility] - [phone]  3. Do they need a 30 day or 90 day supply? 90 days supply

## 2015-06-03 NOTE — Telephone Encounter (Signed)
Refill sent to the pharmacy electronically.  

## 2015-06-06 ENCOUNTER — Other Ambulatory Visit: Payer: Self-pay | Admitting: *Deleted

## 2015-06-06 MED ORDER — HYDRALAZINE HCL 25 MG PO TABS: ORAL_TABLET | ORAL | Status: DC

## 2015-06-09 ENCOUNTER — Ambulatory Visit (INDEPENDENT_AMBULATORY_CARE_PROVIDER_SITE_OTHER): Payer: Medicare Other | Admitting: Endocrinology

## 2015-06-09 ENCOUNTER — Encounter: Payer: Self-pay | Admitting: Endocrinology

## 2015-06-09 VITALS — BP 142/76 | HR 71 | Temp 97.5°F | Resp 16 | Ht 63.0 in | Wt 234.6 lb

## 2015-06-09 DIAGNOSIS — E1165 Type 2 diabetes mellitus with hyperglycemia: Secondary | ICD-10-CM | POA: Diagnosis not present

## 2015-06-09 DIAGNOSIS — Z794 Long term (current) use of insulin: Secondary | ICD-10-CM | POA: Diagnosis not present

## 2015-06-09 NOTE — Progress Notes (Signed)
Patient ID: [name], female   DOB: January 21, 1940, 75 y.o.   MRN: [id number]    Reason for Appointment: Followup for Type 2 Diabetes    History of Present Illness:          Diagnosis: Type 2 diabetes mellitus, date of diagnosis:2011       Past history: Since she was apparently intolerant to metformin she was treated with Onglyza until about 2014  At that time because of increasing A1c of about 8% she was started on Levemir 15 units and this was aggressively increased Onglyza was continued She has not tried any other treatments for diabetes Her A1c has been 10-10.5 in 2015 In 3/15 she was told to start small doses of NovoLog at lunch and supper and add 15 units of Levemir at night also To control  hyperglycemia with her basal bolus insulin regimen she was given Victoza in addition on her initial consultation in 5/15 She has been on Victoza since 8/15   Recent history:   INSULIN regimen :  V-go pump 40 unit basal, boluses at meal times: 6-6-8 units  Noninsulin hypoglycemic drugs the patient is taking are:  VICTOZA 1.2 mg  She was changed on 09/19/14 to the V-go pump instead of basal bolus insulin shots With the V-go pump her blood sugars have improved overall and are less variable On her last visit she was not keeping the pump on all night and she was asked to do so; she thinks that with the Trileptal 30 units pump her blood sugars were higher  Current management, blood sugar patterns and problems:  Fasting blood sugars are generally better although not consistently near normal  She has only a few readings around lunchtime high readings  She is now eating either cereal or oatmeal on some days without any protein and does not take any extra boluses for this  She has a couple of readings in the afternoons are fairly good  Blood sugars are checked very infrequently after supper and these are mostly high.  Highest reading was 344 after eating pasta  She said that when  she is going out to eat she will not have any carbohydrates and with a grilled chicken her blood sugar does fine  She is gradually gaining weight despite taking Victoza  No hypoglycemia  She has no difficulty with using the V-go pump, however she said that sometimes when she does the boluses she has a significant burning discomfort especially when applying it laterally on the abdomen.  Has not applied it at any other psych      Meals: 3 meals per day. Bfst oatmeal or egg or cereal at 8 am, dinner 5 pm.; no hs snacks   Side effects from medications have been: Metformin: rash  Glucose monitoring:  done 1 time a day         Glucometer:  One Touch.      Blood Glucose readings from home glucose meter download:  Mean values apply above for all meters except median for One Touch  PRE-MEAL Fasting Lunch Dinner Bedtime Overall  Glucose range: 132-163       Mean/median: 150     149   POST-MEAL PC Breakfast PC Lunch PC Dinner  Glucose range: 79-198  108-1 49  131-344   Mean/median:   235     Glycemic control:     Lab Results  Component Value Date   HGBA1C 7.3* 05/14/2015   HGBA1C 6.2 02/03/2015  HGBA1C 7.0* 08/21/2014   Lab Results  Component Value Date   MICROALBUR 0.8 09/19/2014   LDLCALC 101 08/21/2014   CREATININE 0.72 05/14/2015    Self-care: The diet that the patient has been following is: tries to limit fats              Exercise: Walks 15-20 min       Dietician visit: Most recent: 2014 ( class).               Compliance with the medical regimen: Good  Retinal exam: Most recent:.9/14    Weight history:  Wt Readings from Last 3 Encounters:  06/09/15 234 lb 9.6 oz (106.414 kg)  05/19/15 232 lb (105.235 kg)  03/10/15 229 lb (103.874 kg)          Medication List       This list is accurate as of: 06/09/15  2:25 PM.  Always use your most recent med list.               amLODipine 10 MG tablet  Commonly known as:  NORVASC  Take 1 tablet (10 mg total) by  mouth daily.     aspirin 325 MG tablet  Take 325 mg by mouth daily with breakfast.     carbamazepine 200 MG 12 hr capsule  Commonly known as:  CARBATROL  Take 200-400 mg by mouth 2 (two) times daily. Take 1 capsule (200 mg) daily with breakfast, take 2 capsules (400 mg) daily with supper     CARTIA XT PO  Take 1 tablet by mouth daily.     donepezil 10 MG tablet  Commonly known as:  ARICEPT  Take 10 mg by mouth daily with supper.     esomeprazole 40 MG capsule  Commonly known as:  NEXIUM  Take 40 mg by mouth daily with supper.     folic acid 1 MG tablet  Commonly known as:  FOLVITE  Take 1 mg by mouth daily.     furosemide 40 MG tablet  Commonly known as:  LASIX  Take 2 tablets (80 mg total) by mouth daily.     gabapentin 300 MG capsule  Commonly known as:  NEURONTIN  TAKE 1 CAPSULE (300 MG TOTAL) BY MOUTH 3 (THREE) TIMES DAILY.     glucose blood test strip  Commonly known as:  ONE TOUCH ULTRA TEST  Use as instructed to check blood sugar 5 times per day dx code E11.65     hydrALAZINE 25 MG tablet  Commonly known as:  APRESOLINE  TAKE 1 TABLET (25 MG TOTAL) BY MOUTH 3 (THREE) TIMES DAILY. <PLEASE MAKE SURE YOU COME TO  APPOINTMENT ON  08/04/15..AT [address]     insulin lispro 100 UNIT/ML injection  Commonly known as:  HUMALOG  Inject 0.78 mLs (78 Units total) into the skin once. With V-go pump     Insulin Pen Needle 32G X 4 MM Misc  Use 8 pen needles per day     levothyroxine 75 MCG tablet  Commonly known as:  SYNTHROID, LEVOTHROID  Take 75 mcg by mouth daily.     Liraglutide 18 MG/3ML Sopn  Commonly known as:  VICTOZA  INJECT (1.2 MG) DAILY AS DIRECTED     meclizine 25 MG tablet  Commonly known as:  ANTIVERT  Take 1 tablet (25 mg total) by mouth 3 (three) times daily as needed.     methotrexate 2.5 MG tablet  Commonly known as:  RHEUMATREX  Take  2.5 mg by mouth once a week. Take 4 every Monday per patient     nitroGLYCERIN 0.4 MG SL tablet  Commonly  known as:  NITROSTAT  Place 0.4 mg under the tongue every 5 (five) minutes as needed for chest pain.     OLANZapine 5 MG tablet  Commonly known as:  ZYPREXA  Take 5 mg by mouth at bedtime.     olmesartan 40 MG tablet  Commonly known as:  BENICAR  Take 1 tablet (40 mg total) by mouth daily.     ondansetron 8 MG tablet  Commonly known as:  ZOFRAN  Take 1 tablet (8 mg total) by mouth every 8 (eight) hours as needed for nausea.     ONE TOUCH ULTRA 2 w/Device Kit  Use to check blood sugar 5 times per day Dx code U76.54     [facility] DELICA LANCETS 65K Misc  Use to check blood sugar 5 times per day dx code E11.65     PROBIOTIC DAILY PO  Take 1 tablet by mouth daily. Reported on 05/19/2015     solifenacin 5 MG tablet  Commonly known as:  VESICARE  Take 5 mg by mouth daily with breakfast.     spironolactone 25 MG tablet  Commonly known as:  ALDACTONE  Take 25 mg by mouth daily.     V-GO 40 Kit  USE ONE PER DAY AS DIRECTED     Vitamin D 2000 units tablet  Take 2,000 Units by mouth daily with breakfast.     ZENPEP 25000 units Cpep  Generic drug:  Pancrelipase (Lip-Prot-Amyl)  Take 25,000 tablets by mouth See admin instructions. Take 3 tablets by mouth morning noon and night        Allergies:  Allergies  Allergen Reactions  . Flagyl [Metronidazole Hcl] Itching and Rash  . Ciprofloxacin Itching and Rash  . Metformin And Related Rash  . Milk-Related Compounds Other (See Comments)    Stomach pains  . Other Other (See Comments)    [place] nuts cause severe facial redness    Past Medical History  Diagnosis Date  . Thyroid disease   . Hypertension   . Ischemic colitis, enteritis, or enterocolitis ([place]   . Sleep apnea   . Urinary bladder incontinence   . GERD (gastroesophageal reflux disease)   . Hyperlipidemia   . Diabetes mellitus     diet controlled  . Stroke ([place])     x's 2  . Depression     Bipolar  . Vertigo     Past Surgical History  Procedure Laterality  Date  . Retinal detachment surgery    . Appendectomy    . Tonsillectomy    . Hemorrhoid surgery    . Fracture surgery    . Cholecystectomy    . Abdominal hysterectomy      partial   . Cardiac catheterization N/A 12/24/2014    Procedure: Right/Left Heart Cath and Coronary Angiography;  Surgeon: Sanda Klein, MD;  Location: [facility];  Service: Cardiovascular;  Laterality: N/A;    Family History  Problem Relation Age of Onset  . Heart disease Mother   . Stroke Father   . Cancer Sister   . Cancer Brother   . Parkinson's disease Father     Social History:  reports that she has never smoked. She has never used smokeless tobacco. She reports that she does not drink alcohol or use illicit drugs.    Review of Systems   She has  had less edema and takes Lasix regularly now  NEUROPATHY: She has lower leg pain and takes gabapentin twice a day for this, however does not have any symptoms      Lipids: Last LDL from PCP was 101, followed by PCP also        Lab Results  Component Value Date   CHOL 153 04/26/2014   HDL 55.60 04/26/2014   LDLCALC 101 08/21/2014   TRIG 113.0 04/26/2014   CHOLHDL 3 04/26/2014       Thyroid:   Has had presumed hypothyroidism for over 20 years, has not had a change in medications in several years and last TSH was  1.9 in August from PCP   Lab Results  Component Value Date   TSH 2.39 01/27/2015   TSH 1.90 08/21/2014   TSH 1.65 01/31/2014   FREET4 0.86 01/31/2014   FREET4 0.91 08/28/2013       The blood pressure has been controlled with a combination of amlodipine and Benicar HCT as well as hydralazine and bisoprolol  Blood pressure is recently much lower, she thinks it was 100/80 this morning She also feels somewhat lightheaded She does not have a follow-up appointment with her PCP next week   Physical Examination:  BP 142/76 mmHg  Pulse 71  Temp(Src) 97.5 F (36.4 C)  Resp 16  Ht 5' 3"  (1.6 m)  Wt 234 lb 9.6 oz (106.414 kg)   BMI 41.57 kg/m2  SpO2 91%    1+ pedal edema present. She does not look pale  ASSESSMENT/PLAN:   Diabetes type 2, uncontrolled   See history of present illness for detailed discussion of his current management, blood sugar patterns and problems identified Her blood sugars are excellent with the V-go pump and this has improved her compliance with mealtime insulin. Also has less variability with this A1c has come down further and is upper normal now at 6.2  She misunderstood and stopped checking her blood sugars nonfasting, discussed need for checking postprandial readings to help adjust her boluses and monitor effects of various foods. Discussed blood sugar targets May adjust her boluses by one click based on what she is eating  She can reduce frequency of monitoring in the morning Her monitor was reset to  the correct daytime time She can switch Novolog to Humalog since her insurance will not cover Novolog now, should not make any difference in her level of control  Follow-up in 3 months again  NEUROPATHY: Symptomatically better and she can take gabapentin as needed only  HYPOTHYROIDISM: Her TSH is normal with 75 g and she will continue the same dose  Patient Instructions  Except for low carb meals use 4-5 clicks at supper  More sugars after all meals  Check blood sugars on waking up 3  times a week Also check blood sugars about 2 hours after a meal and do this after different meals by rotation  Recommended blood sugar levels on waking up is 90-130 and about 2 hours after meal is 130-160  Please bring your blood sugar monitor to each visit, thank you    Counseling time on subjects discussed above is over 50% of today's 25 minute visit   Damiah Mcdonald 06/09/2015, 2:25 PM   Note: This office note was prepared with Dragon voice recognition system technology. Any transcriptional errors that result from this process are unintentional.                     Patient  ID:  [name], female   DOB: June 21, 1940, 75 y.o.   MRN: [id number]    Reason for Appointment: Followup for Type 2 Diabetes and swelling of legs  History of Present Illness:          Diagnosis: Type 2 diabetes mellitus, date of diagnosis:2011       Past history: Since she was apparently intolerant to metformin she was treated with Onglyza until about 2014  At that time because of increasing A1c of about 8% she was started on Levemir 15 units and this was aggressively increased Onglyza was continued She has not tried any other treatments for diabetes Her A1c has been 10-10.5 in 2015 In 3/15 she was told to start small doses of NovoLog at lunch and supper and add 15 units of Levemir at night also To control  hyperglycemia with her basal bolus insulin regimen she was given Victoza in addition on her initial consultation in 5/15  Recent history:   INSULIN regimen :  V-go pump 40 unit basal, boluses at meal times: 6-6-8 units  She was changed on 09/19/14 to the V-go pump instead of basal bolus insulin shots She has been on Victoza since 8/15 and she is taking 1.2 mg With the V-go pump her blood sugars have improved overall and are less variable  Current blood sugar patterns and problems:  Fasting blood sugars are very consistent, usually just above 100  She has stopped checking her blood sugars later in the day even though she was told to vary the times when she checks her sugar  She has no difficulty remembering to do her boluses with meals but usually does not change the amounts  She has improved her diet and is trying to  eliminate high glycemic index foods and juices  Recently trying to walk more regularly also  No hypoglycemia  She has no difficulty with using the V-go pump, doing the boluses when she is eating out and changing it every morning    Oral hypoglycemic drugs the patient is taking are: None    Side effects from medications have been: Metformin:  rash  Glucose monitoring:  done 1 time a day         Glucometer:  One Touch.      Blood Glucose readings from home glucose meter download:  FASTING readings recently: 90-147, usually under 130.  Her monitor has incorrect date and time programmed   Glycemic control:   A1c was 7  from PCP in 8/16  Lab Results  Component Value Date   HGBA1C 7.3* 05/14/2015   HGBA1C 6.2 02/03/2015   HGBA1C 7.0* 08/21/2014   Lab Results  Component Value Date   MICROALBUR 0.8 09/19/2014   LDLCALC 101 08/21/2014   CREATININE 0.72 05/14/2015    Self-care: The diet that the patient has been following is: tries to limit fats     Meals: 3 meals per day. Bfst oatmeal or egg/toast at 8-9 am, dinner 5 pm cutting back on frequent meals and  portions; no hs snacks           Exercise: Walks 15-20 min       Dietician visit: Most recent: 2014 ( class).               Compliance with the medical regimen: Good  Retinal exam: Most recent:.9/14    Weight history:  Wt Readings from Last 3 Encounters:  06/09/15 234 lb 9.6 oz (106.414 kg)  05/19/15 232 lb (105.235 kg)  03/10/15 229 lb (103.874 kg)      Medication List       This list is accurate as of: 06/09/15  2:25 PM.  Always use your most recent med list.               amLODipine 10 MG tablet  Commonly known as:  NORVASC  Take 1 tablet (10 mg total) by mouth daily.     aspirin 325 MG tablet  Take 325 mg by mouth daily with breakfast.     carbamazepine 200 MG 12 hr capsule  Commonly known as:  CARBATROL  Take 200-400 mg by mouth 2 (two) times daily. Take 1 capsule (200 mg) daily with breakfast, take 2 capsules (400 mg) daily with supper     CARTIA XT PO  Take 1 tablet by mouth daily.     donepezil 10 MG tablet  Commonly known as:  ARICEPT  Take 10 mg by mouth daily with supper.     esomeprazole 40 MG capsule  Commonly known as:  NEXIUM  Take 40 mg by mouth daily with supper.     folic acid 1 MG tablet  Commonly known as:  FOLVITE   Take 1 mg by mouth daily.     furosemide 40 MG tablet  Commonly known as:  LASIX  Take 2 tablets (80 mg total) by mouth daily.     gabapentin 300 MG capsule  Commonly known as:  NEURONTIN  TAKE 1 CAPSULE (300 MG TOTAL) BY MOUTH 3 (THREE) TIMES DAILY.     glucose blood test strip  Commonly known as:  ONE TOUCH ULTRA TEST  Use as instructed to check blood sugar 5 times per day dx code E11.65     hydrALAZINE 25 MG tablet  Commonly known as:  APRESOLINE  TAKE 1 TABLET (25 MG TOTAL) BY MOUTH 3 (THREE) TIMES DAILY. <PLEASE MAKE SURE YOU COME TO  APPOINTMENT ON  08/04/15..AT [address]     insulin lispro 100 UNIT/ML injection  Commonly known as:  HUMALOG  Inject 0.78 mLs (78 Units total) into the skin once. With V-go pump     Insulin Pen Needle 32G X 4 MM Misc  Use 8 pen needles per day     levothyroxine 75 MCG tablet  Commonly known as:  SYNTHROID, LEVOTHROID  Take 75 mcg by mouth daily.     Liraglutide 18 MG/3ML Sopn  Commonly known as:  VICTOZA  INJECT (1.2 MG) DAILY AS DIRECTED     meclizine 25 MG tablet  Commonly known as:  ANTIVERT  Take 1 tablet (25 mg total) by mouth 3 (three) times daily as needed.     methotrexate 2.5 MG tablet  Commonly known as:  RHEUMATREX  Take 2.5 mg by mouth once a week. Take 4 every Monday per patient     nitroGLYCERIN 0.4 MG SL tablet  Commonly known as:  NITROSTAT  Place 0.4 mg under the tongue every 5 (five) minutes as needed for chest pain.     OLANZapine 5 MG tablet  Commonly known as:  ZYPREXA  Take 5 mg by mouth at bedtime.     olmesartan 40 MG tablet  Commonly known as:  BENICAR  Take 1 tablet (40 mg total) by mouth daily.     ondansetron 8 MG tablet  Commonly known as:  ZOFRAN  Take 1 tablet (8 mg total) by mouth every 8 (eight) hours as needed for nausea.     ONE TOUCH ULTRA 2  w/Device Kit  Use to check blood sugar 5 times per day Dx code R51.88     [facility] DELICA LANCETS 41Y Misc  Use to check blood sugar 5 times per  day dx code E11.65     PROBIOTIC DAILY PO  Take 1 tablet by mouth daily. Reported on 05/19/2015     solifenacin 5 MG tablet  Commonly known as:  VESICARE  Take 5 mg by mouth daily with breakfast.     spironolactone 25 MG tablet  Commonly known as:  ALDACTONE  Take 25 mg by mouth daily.     V-GO 40 Kit  USE ONE PER DAY AS DIRECTED     Vitamin D 2000 units tablet  Take 2,000 Units by mouth daily with breakfast.     ZENPEP 25000 units Cpep  Generic drug:  Pancrelipase (Lip-Prot-Amyl)  Take 25,000 tablets by mouth See admin instructions. Take 3 tablets by mouth morning noon and night        Allergies:  Allergies  Allergen Reactions  . Flagyl [Metronidazole Hcl] Itching and Rash  . Ciprofloxacin Itching and Rash  . Metformin And Related Rash  . Milk-Related Compounds Other (See Comments)    Stomach pains  . Other Other (See Comments)    [place] nuts cause severe facial redness    Past Medical History  Diagnosis Date  . Thyroid disease   . Hypertension   . Ischemic colitis, enteritis, or enterocolitis ([place])   . Sleep apnea   . Urinary bladder incontinence   . GERD (gastroesophageal reflux disease)   . Hyperlipidemia   . Diabetes mellitus     diet controlled  . Stroke ([place])     x's 2  . Depression     Bipolar  . Vertigo     Past Surgical History  Procedure Laterality Date  . Retinal detachment surgery    . Appendectomy    . Tonsillectomy    . Hemorrhoid surgery    . Fracture surgery    . Cholecystectomy    . Abdominal hysterectomy      partial   . Cardiac catheterization N/A 12/24/2014    Procedure: Right/Left Heart Cath and Coronary Angiography;  Surgeon: Sanda Klein, MD;  Location: [facility];  Service: Cardiovascular;  Laterality: N/A;    Family History  Problem Relation Age of Onset  . Heart disease Mother   . Stroke Father   . Cancer Sister   . Cancer Brother   . Parkinson's disease Father     Social History:  reports that she has  never smoked. She has never used smokeless tobacco. She reports that she does not drink alcohol or use illicit drugs.    Review of Systems   She has had less edema and takes Lasix most of the days   HYPONATREMIA: Her sodium is 130 and she is not on any Thiazide diuretics or other medications like SSRIs However she drinks 4 or more servings of 32 ounces water a day and other drinks  NEUROPATHY: She has lower leg pain and takes gabapentin twice a day for this with relief      Lipids: Last LDL from PCP was 101, followed by PCP also        Lab Results  Component Value Date   CHOL 153 04/26/2014   HDL 55.60 04/26/2014   LDLCALC 101 08/21/2014   TRIG 113.0 04/26/2014   CHOLHDL 3 04/26/2014       Thyroid:    Has had  presumed hypothyroidism for over 20 years, has not had a change in medications in several years and last TSH was:   Lab Results  Component Value Date   TSH 2.39 01/27/2015   TSH 1.90 08/21/2014   TSH 1.65 01/31/2014   FREET4 0.86 01/31/2014   FREET4 0.91 08/28/2013       The blood pressure has been controlled with a combination of amlodipine and Benicar HCT as well as hydralazine and Diltiazem   She does have a previously low sodium  Physical Examination:  BP 142/76 mmHg  Pulse 71  Temp(Src) 97.5 F (36.4 C)  Resp 16  Ht 5' 3"  (1.6 m)  Wt 234 lb 9.6 oz (106.414 kg)  BMI 41.57 kg/m2  SpO2 91%    ASSESSMENT/PLAN:   Diabetes type 2, uncontrolled   See history of present illness for detailed discussion of current  management, blood sugar patterns and problems identified.  Her blood sugars are somewhat better controlled with using a 40 unit basal However she is having higher readings either with the high carbohydrate breakfast or with her evening meal and that she is eating very low carbohydrate meals Discussed balancing boluses and carbohydrates as well as meal size She does also need to have protein consistently at breakfast  Discussed that she will  need to bolus at least 2-4 units more for her evening meal Also will have to add protein to breakfast and with cereal at 2 more units Consultation with dietitian Also check blood sugars more often after meals which she is not doing  Discussed issues with her pump causing pain on bolusing and she can try to use her arms instead of abdomen, discussed injection sites   Patient Instructions  Except for low carb meals use 4-5 clicks at supper  More sugars after all meals  Check blood sugars on waking up 3  times a week Also check blood sugars about 2 hours after a meal and do this after different meals by rotation  Recommended blood sugar levels on waking up is 90-130 and about 2 hours after meal is 130-160  Please bring your blood sugar monitor to each visit, thank you     Counseling time on subjects discussed above is over 50% of today's 25 minute visit   Maddie Brazier 06/09/2015, 2:25 PM   Note: This office note was prepared with Dragon voice recognition system technology. Any transcriptional errors that result from this process are unintentional.

## 2015-06-09 NOTE — Patient Instructions (Signed)
Except for low carb meals use 4-5 clicks at supper  More sugars after all meals  Check blood sugars on waking up 3  times a week Also check blood sugars about 2 hours after a meal and do this after different meals by rotation  Recommended blood sugar levels on waking up is 90-130 and about 2 hours after meal is 130-160  Please bring your blood sugar monitor to each visit, thank you

## 2015-07-31 ENCOUNTER — Other Ambulatory Visit (INDEPENDENT_AMBULATORY_CARE_PROVIDER_SITE_OTHER): Payer: Medicare Other

## 2015-07-31 DIAGNOSIS — Z794 Long term (current) use of insulin: Secondary | ICD-10-CM

## 2015-07-31 DIAGNOSIS — E1165 Type 2 diabetes mellitus with hyperglycemia: Secondary | ICD-10-CM

## 2015-07-31 LAB — HEMOGLOBIN A1C: Hgb A1c MFr Bld: 7.2 % — ABNORMAL HIGH (ref 4.6–6.5)

## 2015-08-01 LAB — BASIC METABOLIC PANEL
BUN: 11 mg/dL (ref 6–23)
CALCIUM: 9 mg/dL (ref 8.4–10.5)
CO2: 27 meq/L (ref 19–32)
Chloride: 101 mEq/L (ref 96–112)
Creatinine, Ser: 0.82 mg/dL (ref 0.40–1.20)
GFR: 72.38 mL/min (ref >60.00)
Glucose, Bld: 191 mg/dL — ABNORMAL HIGH (ref 70–99)
Potassium: 4.9 mEq/L (ref 3.5–5.1)
SODIUM: 137 meq/L (ref 135–145)

## 2015-08-04 ENCOUNTER — Encounter: Payer: Self-pay | Admitting: Cardiovascular Disease

## 2015-08-04 ENCOUNTER — Encounter: Payer: Self-pay | Admitting: Endocrinology

## 2015-08-04 ENCOUNTER — Ambulatory Visit (INDEPENDENT_AMBULATORY_CARE_PROVIDER_SITE_OTHER): Payer: Medicare Other | Admitting: Endocrinology

## 2015-08-04 ENCOUNTER — Ambulatory Visit (INDEPENDENT_AMBULATORY_CARE_PROVIDER_SITE_OTHER): Payer: Medicare Other | Admitting: Cardiovascular Disease

## 2015-08-04 VITALS — BP 126/74 | HR 75 | Ht 63.0 in | Wt 234.0 lb

## 2015-08-04 VITALS — BP 144/76 | HR 77 | Ht 63.0 in | Wt 233.6 lb

## 2015-08-04 DIAGNOSIS — E785 Hyperlipidemia, unspecified: Secondary | ICD-10-CM

## 2015-08-04 DIAGNOSIS — I10 Essential (primary) hypertension: Secondary | ICD-10-CM

## 2015-08-04 DIAGNOSIS — E1165 Type 2 diabetes mellitus with hyperglycemia: Secondary | ICD-10-CM

## 2015-08-04 DIAGNOSIS — R600 Localized edema: Secondary | ICD-10-CM | POA: Diagnosis not present

## 2015-08-04 DIAGNOSIS — Z79899 Other long term (current) drug therapy: Secondary | ICD-10-CM | POA: Diagnosis not present

## 2015-08-04 DIAGNOSIS — Z794 Long term (current) use of insulin: Secondary | ICD-10-CM

## 2015-08-04 MED ORDER — LIRAGLUTIDE 18 MG/3ML ~~LOC~~ SOPN: PEN_INJECTOR | SUBCUTANEOUS | Status: DC

## 2015-08-04 MED ORDER — HYDROCHLOROTHIAZIDE 25 MG PO TABS: 25.0000 mg | ORAL_TABLET | Freq: Every day | ORAL | Status: DC

## 2015-08-04 NOTE — Progress Notes (Signed)
Patient ID: [name], female   DOB: 1940/02/14, 75 y.o.   MRN: [id number]    Reason for Appointment: Followup for Type 2 Diabetes    History of Present Illness:          Diagnosis: Type 2 diabetes mellitus, date of diagnosis:2011       Past history: Since she was apparently intolerant to metformin she was treated with Onglyza until about 2014  At that time because of increasing A1c of about 8% she was started on Levemir 15 units and this was aggressively increased Onglyza was continued She has not tried any other treatments for diabetes Her A1c has been 10-10.5 in 2015 In 3/15 she was told to start small doses of NovoLog at lunch and supper and add 15 units of Levemir at night also To control  hyperglycemia with her basal bolus insulin regimen she was given Victoza in addition on her initial consultation in 5/15 She has been on Victoza since 8/15   Recent history:   INSULIN regimen :  V-go pump 40 unit basal, boluses at meal times: 6-6-8 units  Noninsulin hypoglycemic drugs the patient is taking are:  VICTOZA 1.2 mg  She was changed on 09/19/14 to the V-go pump instead of basal bolus insulin shots With the V-go pump her blood sugars have improved overall and are less variable However A1c now appears to be relatively higher at 7.2  Current management, blood sugar patterns and problems:  Fasting blood sugars are somewhat variable and mostly mildly increased.  She does not have consistent readings after breakfast or lunch with some high readings  Recently with cutting back on carbohydrates in the morning she is usually not having high readings  Difficult to know which readings may they are before or after lunch as she is sometimes eating early  Has a couple of good readings before supper but none after supper as she forgets   She does have somewhat variable carbohydrate intake and she does not adjust her boluses based on what she is eating  Also does not take  any extra insulin when eating larger amounts or more carbohydrate  Has not increased her Victoza as directed up to 1.8, still taking 1.2 mg      Meals: 3 meals per day. Bfst oatmeal or egg or cereal at 8 am, dinner 5 pm.; no hs snacks   Side effects from medications have been: Metformin: rash  Glucose monitoring:  done 1 time a day         Glucometer:  One Touch.      Blood Glucose readings from home glucose meter download:  Mean values apply above for all meters except median for One Touch  PRE-MEAL Fasting Midday  Afternoons  Bedtime Overall  Glucose range: 121-183 95-237  121-212     Mean/median: 140     142   Glycemic control:    Lab Results  Component Value Date   HGBA1C 7.2* 07/31/2015   HGBA1C 7.3* 05/14/2015   HGBA1C 6.2 02/03/2015   Lab Results  Component Value Date   MICROALBUR 0.8 09/19/2014   LDLCALC 101 08/21/2014   CREATININE 0.82 07/31/2015    Self-care: The diet that the patient has been following is: tries to limit fats              Exercise: Walks 20 min, 5/7       Dietician visit: Most recent: 2014 ( class).    Low Carb Bfst  Compliance with the medical regimen: Good  Retinal exam: Most recent:.9/14    Weight history:  Wt Readings from Last 3 Encounters:  08/04/15 234 lb (106.142 kg)  08/04/15 233 lb 9.6 oz (105.96 kg)  06/09/15 234 lb 9.6 oz (106.414 kg)          Medication List       This list is accurate as of: 08/04/15  2:49 PM.  Always use your most recent med list.               aspirin 325 MG tablet  Take 325 mg by mouth daily with breakfast.     betamethasone dipropionate 0.05 % cream  Commonly known as:  DIPROLENE  Apply 1 application topically 2 (two) times daily.     bisoprolol 10 MG tablet  Commonly known as:  ZEBETA  Take 20 mg by mouth daily.     carbamazepine 200 MG 12 hr capsule  Commonly known as:  CARBATROL  Take 200 mg by mouth 2 (two) times daily.     clobetasol 0.05 % Gel  Commonly known  as:  TEMOVATE  Apply 1 application topically 2 (two) times daily.     donepezil 10 MG tablet  Commonly known as:  ARICEPT  Take 10 mg by mouth daily with supper.     esomeprazole 40 MG capsule  Commonly known as:  NEXIUM  Take 40 mg by mouth daily with supper.     folic acid 1 MG tablet  Commonly known as:  FOLVITE  Take 1 mg by mouth daily.     gabapentin 300 MG capsule  Commonly known as:  NEURONTIN  Take 300 mg by mouth 2 (two) times daily.     glucose blood test strip  Commonly known as:  ONE TOUCH ULTRA TEST  Use as instructed to check blood sugar 5 times per day dx code E11.65     hydrochlorothiazide 25 MG tablet  Commonly known as:  HYDRODIURIL  Take 1 tablet (25 mg total) by mouth daily.     insulin lispro 100 UNIT/ML injection  Commonly known as:  HUMALOG  Inject 0.78 mLs (78 Units total) into the skin once. With V-go pump     Insulin Pen Needle 32G X 4 MM Misc  Use 8 pen needles per day     levothyroxine 75 MCG tablet  Commonly known as:  SYNTHROID, LEVOTHROID  Take 75 mcg by mouth daily.     Liraglutide 18 MG/3ML Sopn  Commonly known as:  VICTOZA  INJECT (1.8 MG) DAILY AS DIRECTED     methotrexate 2.5 MG tablet  Commonly known as:  RHEUMATREX  Take 7.5-10 mg by mouth 3 (three) times daily with meals.     methotrexate 2.5 MG tablet  Commonly known as:  RHEUMATREX  Take 2.5 mg by mouth once a week. Take 4 every Monday per patient     nitroGLYCERIN 0.4 MG SL tablet  Commonly known as:  NITROSTAT  Place 0.4 mg under the tongue every 5 (five) minutes as needed for chest pain.     OLANZapine 5 MG tablet  Commonly known as:  ZYPREXA  Take 5 mg by mouth at bedtime.     olmesartan 40 MG tablet  Commonly known as:  BENICAR  Take 1 tablet (40 mg total) by mouth daily.     ondansetron 8 MG tablet  Commonly known as:  ZOFRAN  Take 1 tablet (8 mg total) by mouth every 8 (eight) hours  as needed for nausea.     ONE TOUCH ULTRA 2 w/Device Kit  Use to check  blood sugar 5 times per day Dx code P53.74     [facility] DELICA LANCETS 82L Misc  Use to check blood sugar 5 times per day dx code E11.65     solifenacin 5 MG tablet  Commonly known as:  VESICARE  Take 5 mg by mouth daily with breakfast.     spironolactone 25 MG tablet  Commonly known as:  ALDACTONE  Take 25 mg by mouth daily.     V-GO 40 Kit  USE ONE PER DAY AS DIRECTED     Vitamin D 2000 units tablet  Take 2,000 Units by mouth daily with breakfast.     ZENPEP 25000 units Cpep  Generic drug:  Pancrelipase (Lip-Prot-Amyl)  Take 25,000 tablets by mouth See admin instructions. Reported on 08/04/2015        Allergies:  Allergies  Allergen Reactions  . Flagyl [Metronidazole Hcl] Itching and Rash  . Ciprofloxacin Itching and Rash  . Metformin And Related Rash  . Milk-Related Compounds Other (See Comments)    Stomach pains  . Other Other (See Comments)    [place] nuts cause severe facial redness    Past Medical History  Diagnosis Date  . Thyroid disease   . Hypertension   . Ischemic colitis, enteritis, or enterocolitis ([place])   . Sleep apnea   . Urinary bladder incontinence   . GERD (gastroesophageal reflux disease)   . Hyperlipidemia   . Diabetes mellitus     diet controlled  . Stroke ([place])     x's 2  . Depression     Bipolar  . Vertigo     Past Surgical History  Procedure Laterality Date  . Retinal detachment surgery    . Appendectomy    . Tonsillectomy    . Hemorrhoid surgery    . Fracture surgery    . Cholecystectomy    . Abdominal hysterectomy      partial   . Cardiac catheterization N/A 12/24/2014    Procedure: Right/Left Heart Cath and Coronary Angiography;  Surgeon: Sanda Klein, MD;  Location: [place] [facility];  Service: Cardiovascular;  Laterality: N/A;    Family History  Problem Relation Age of Onset  . Heart disease Mother   . Stroke Father   . Cancer Sister   . Cancer Brother   . Parkinson's disease Father     Social History:   reports that she has never smoked. She has never used smokeless tobacco. She reports that she does not drink alcohol or use illicit drugs.    Review of Systems   She has had less edema and takes Lasix regularly now  NEUROPATHY: She has lower leg pain and takes gabapentin twice a day for this, however does not have any symptoms      Lipids: Last LDL from PCP was 101, followed by PCP also        Lab Results  Component Value Date   CHOL 153 04/26/2014   HDL 55.60 04/26/2014   LDLCALC 101 08/21/2014   TRIG 113.0 04/26/2014   CHOLHDL 3 04/26/2014       Thyroid:   Has had presumed hypothyroidism for over 20 years, has not had a change in medications in several years and last TSH was  1.9 in August from PCP   Lab Results  Component Value Date   TSH 2.39 01/27/2015   TSH 1.90 08/21/2014  TSH 1.65 01/31/2014   FREET4 0.86 01/31/2014   FREET4 0.91 08/28/2013       The blood pressure has been controlled with a combination of amlodipine and Benicar HCT as well as hydralazine and bisoprolol  Blood pressure is recently much lower, she thinks it was 100/80 this morning She also feels somewhat lightheaded She does not have a follow-up appointment with her PCP next week   Physical Examination:  BP 126/74 mmHg  Pulse 75  Ht _0  (1.6 m)  Wt 234 lb (106.142 kg)  BMI 41.46 kg/m2  SpO2 94%    1+ pedal edema present. She does not look pale  ASSESSMENT/PLAN:   Diabetes type 2, uncontrolled   See history of present illness for detailed discussion of his current management, blood sugar patterns and problems identified Her blood sugars are excellent with the V-go pump and this has improved her compliance with mealtime insulin. Also has less variability with this A1c has come down further and is upper normal now at 6.2  She misunderstood and stopped checking her blood sugars nonfasting, discussed need for checking postprandial readings to help adjust her boluses and monitor effects  of various foods. Discussed blood sugar targets May adjust her boluses by one click based on what she is eating  She can reduce frequency of monitoring in the morning Her monitor was reset to  the correct daytime time She can switch Novolog to Humalog since her insurance will not cover Novolog now, should not make any difference in her level of control  Follow-up in 3 months again  NEUROPATHY: Symptomatically better and she can take gabapentin as needed only  HYPOTHYROIDISM: Her TSH is normal with 75 g and she will continue the same dose  Patient Instructions  Check blood sugars on waking up 4-5  times a week  Also check blood sugars about 2 hours after a meal and do this after different meals by rotation  Recommended blood sugar levels on waking up is 90-130 and about 2 hours after meal is 130-160  Please bring your blood sugar monitor to each visit, thank you  Extra 1 click at least for more Carbs or fat    Counseling time on subjects discussed above is over 50% of today's 25 minute visit   Shontez Sermon 08/04/2015, 2:49 PM   Note: This office note was prepared with Dragon voice recognition system technology. Any transcriptional errors that result from this process are unintentional.                     Patient ID: [name], female   DOB: 05-14-1940, 75 y.o.   MRN: [id number]    Reason for Appointment: Followup for Type 2 Diabetes and swelling of legs  History of Present Illness:          Diagnosis: Type 2 diabetes mellitus, date of diagnosis:2011       Past history: Since she was apparently intolerant to metformin she was treated with Onglyza until about 2014  At that time because of increasing A1c of about 8% she was started on Levemir 15 units and this was aggressively increased Onglyza was continued She has not tried any other treatments for diabetes Her A1c has been 10-10.5 in 2015 In 3/15 she was told to start small doses of NovoLog at lunch  and supper and add 15 units of Levemir at night also To control  hyperglycemia with her basal bolus insulin regimen she was given Victoza in addition  on her initial consultation in 5/15  Recent history:   INSULIN regimen :  V-go pump 40 unit basal, boluses at meal times: 6-6-8 units  She was changed on 09/19/14 to the V-go pump instead of basal bolus insulin shots She has been on Victoza since 8/15 and she is taking 1.2 mg With the V-go pump her blood sugars have improved overall and are less variable  Current blood sugar patterns and problems:  Fasting blood sugars are very consistent, usually just above 100  She has stopped checking her blood sugars later in the day even though she was told to vary the times when she checks her sugar  She has no difficulty remembering to do her boluses with meals but usually does not change the amounts  She has improved her diet and is trying to  eliminate high glycemic index foods and juices  Recently trying to walk more regularly also  No hypoglycemia  She has no difficulty with using the V-go pump, doing the boluses when she is eating out and changing it every morning    Oral hypoglycemic drugs the patient is taking are: None    Side effects from medications have been: Metformin: rash  Glucose monitoring:  done 1 time a day         Glucometer:  One Touch.      Blood Glucose readings from home glucose meter download:  FASTING readings recently: 90-147, usually under 130.  Her monitor has incorrect date and time programmed   Glycemic control:   A1c was 7  from PCP in 8/16  Lab Results  Component Value Date   HGBA1C 7.2* 07/31/2015   HGBA1C 7.3* 05/14/2015   HGBA1C 6.2 02/03/2015   Lab Results  Component Value Date   MICROALBUR 0.8 09/19/2014   LDLCALC 101 08/21/2014   CREATININE 0.82 07/31/2015    Self-care: The diet that the patient has been following is: tries to limit fats     Meals: 3 meals per day. Bfst oatmeal or  egg/toast at 8-9 am, dinner 5 pm cutting back on frequent meals and  portions; no hs snacks           Exercise: Walks 15-20 min       Dietician visit: Most recent: 2014 ( class).               Compliance with the medical regimen: Good  Retinal exam: Most recent:.9/14    Weight history:  Wt Readings from Last 3 Encounters:  08/04/15 234 lb (106.142 kg)  08/04/15 233 lb 9.6 oz (105.96 kg)  06/09/15 234 lb 9.6 oz (106.414 kg)      Medication List       This list is accurate as of: 08/04/15  2:49 PM.  Always use your most recent med list.               aspirin 325 MG tablet  Take 325 mg by mouth daily with breakfast.     betamethasone dipropionate 0.05 % cream  Commonly known as:  DIPROLENE  Apply 1 application topically 2 (two) times daily.     bisoprolol 10 MG tablet  Commonly known as:  ZEBETA  Take 20 mg by mouth daily.     carbamazepine 200 MG 12 hr capsule  Commonly known as:  CARBATROL  Take 200 mg by mouth 2 (two) times daily.     clobetasol 0.05 % Gel  Commonly known as:  TEMOVATE  Apply 1 application topically 2 (  two) times daily.     donepezil 10 MG tablet  Commonly known as:  ARICEPT  Take 10 mg by mouth daily with supper.     esomeprazole 40 MG capsule  Commonly known as:  NEXIUM  Take 40 mg by mouth daily with supper.     folic acid 1 MG tablet  Commonly known as:  FOLVITE  Take 1 mg by mouth daily.     gabapentin 300 MG capsule  Commonly known as:  NEURONTIN  Take 300 mg by mouth 2 (two) times daily.     glucose blood test strip  Commonly known as:  ONE TOUCH ULTRA TEST  Use as instructed to check blood sugar 5 times per day dx code E11.65     hydrochlorothiazide 25 MG tablet  Commonly known as:  HYDRODIURIL  Take 1 tablet (25 mg total) by mouth daily.     insulin lispro 100 UNIT/ML injection  Commonly known as:  HUMALOG  Inject 0.78 mLs (78 Units total) into the skin once. With V-go pump     Insulin Pen Needle 32G X 4 MM Misc  Use 8  pen needles per day     levothyroxine 75 MCG tablet  Commonly known as:  SYNTHROID, LEVOTHROID  Take 75 mcg by mouth daily.     Liraglutide 18 MG/3ML Sopn  Commonly known as:  VICTOZA  INJECT (1.8 MG) DAILY AS DIRECTED     methotrexate 2.5 MG tablet  Commonly known as:  RHEUMATREX  Take 7.5-10 mg by mouth 3 (three) times daily with meals.     methotrexate 2.5 MG tablet  Commonly known as:  RHEUMATREX  Take 2.5 mg by mouth once a week. Take 4 every Monday per patient     nitroGLYCERIN 0.4 MG SL tablet  Commonly known as:  NITROSTAT  Place 0.4 mg under the tongue every 5 (five) minutes as needed for chest pain.     OLANZapine 5 MG tablet  Commonly known as:  ZYPREXA  Take 5 mg by mouth at bedtime.     olmesartan 40 MG tablet  Commonly known as:  BENICAR  Take 1 tablet (40 mg total) by mouth daily.     ondansetron 8 MG tablet  Commonly known as:  ZOFRAN  Take 1 tablet (8 mg total) by mouth every 8 (eight) hours as needed for nausea.     ONE TOUCH ULTRA 2 w/Device Kit  Use to check blood sugar 5 times per day Dx code M84.13     [facility] DELICA LANCETS 24M Misc  Use to check blood sugar 5 times per day dx code E11.65     solifenacin 5 MG tablet  Commonly known as:  VESICARE  Take 5 mg by mouth daily with breakfast.     spironolactone 25 MG tablet  Commonly known as:  ALDACTONE  Take 25 mg by mouth daily.     V-GO 40 Kit  USE ONE PER DAY AS DIRECTED     Vitamin D 2000 units tablet  Take 2,000 Units by mouth daily with breakfast.     ZENPEP 25000 units Cpep  Generic drug:  Pancrelipase (Lip-Prot-Amyl)  Take 25,000 tablets by mouth See admin instructions. Reported on 08/04/2015        Allergies:  Allergies  Allergen Reactions  . Flagyl [Metronidazole Hcl] Itching and Rash  . Ciprofloxacin Itching and Rash  . Metformin And Related Rash  . Milk-Related Compounds Other (See Comments)    Stomach pains  .  Other Other (See Comments)    [place] nuts cause severe  facial redness    Past Medical History  Diagnosis Date  . Thyroid disease   . Hypertension   . Ischemic colitis, enteritis, or enterocolitis ([place])   . Sleep apnea   . Urinary bladder incontinence   . GERD (gastroesophageal reflux disease)   . Hyperlipidemia   . Diabetes mellitus     diet controlled  . Stroke ([place])     x's 2  . Depression     Bipolar  . Vertigo     Past Surgical History  Procedure Laterality Date  . Retinal detachment surgery    . Appendectomy    . Tonsillectomy    . Hemorrhoid surgery    . Fracture surgery    . Cholecystectomy    . Abdominal hysterectomy      partial   . Cardiac catheterization N/A 12/24/2014    Procedure: Right/Left Heart Cath and Coronary Angiography;  Surgeon: Sanda Klein, MD;  Location: [place] [facility];  Service: Cardiovascular;  Laterality: N/A;    Family History  Problem Relation Age of Onset  . Heart disease Mother   . Stroke Father   . Cancer Sister   . Cancer Brother   . Parkinson's disease Father     Social History:  reports that she has never smoked. She has never used smokeless tobacco. She reports that she does not drink alcohol or use illicit drugs.    Review of Systems    HYPONATREMIA: Her sodium has been low before  Lab Results  Component Value Date   CREATININE 0.82 07/31/2015   BUN 11 07/31/2015   NA 137 07/31/2015   K 4.9 07/31/2015   CL 101 07/31/2015   CO2 27 07/31/2015     NEUROPATHY: She has lower leg pain and takes gabapentin twice a day for this with relief      Lipids: Last LDL from PCP was 101, followed by PCP also        Lab Results  Component Value Date   CHOL 153 04/26/2014   HDL 55.60 04/26/2014   LDLCALC 101 08/21/2014   TRIG 113.0 04/26/2014   CHOLHDL 3 04/26/2014       Thyroid:    Has had presumed hypothyroidism for over 20 years, has not had a change in medications in several years and last TSH was:   Lab Results  Component Value Date   TSH 2.39 01/27/2015    TSH 1.90 08/21/2014   TSH 1.65 01/31/2014   FREET4 0.86 01/31/2014   FREET4 0.91 08/28/2013       The blood pressure has been controlled with a combination of amlodipine and Benicar HCT as well as hydralazine and Diltiazem   She does have a previously low sodium  Physical Examination:  BP 126/74 mmHg  Pulse 75  Ht _0  (1.6 m)  Wt 234 lb (106.142 kg)  BMI 41.46 kg/m2  SpO2 94%    ASSESSMENT/PLAN:   Diabetes type 2, uncontrolled   See history of present illness for detailed discussion of current  management, blood sugar patterns and problems identified.  Her blood sugars are somewhat Variable, however complete analysis not possible because she checks her blood sugars mostly in the morning and early afternoon  Although she has somewhat better postprandial readings with reducing carbohydrate carbohydrate intake in the morning they are still occasionally going higher after breakfast or lunch She has not done any readings after evening meal and discussed  importance of doing this She also did not continue her 1.8 mg Victoza that was recommended She is however trying to do some walking but is still not losing weight  Recommended the following:  Continue the V-go pump with 40 unit basal  1.8 mg Victoza  She will need to adjust her boluses upwards by 2 units for higher [place] date are higher fat meals or if the baseline glucose is over 150  She will call if she is having consistently high fasting readings  More readings after evening meal to help adjust to suppertime bolus  Consider consultation with dietitian again   Patient Instructions  Check blood sugars on waking up 4-5  times a week  Also check blood sugars about 2 hours after a meal and do this after different meals by rotation  Recommended blood sugar levels on waking up is 90-130 and about 2 hours after meal is 130-160  Please bring your blood sugar monitor to each visit, thank you  Extra 1 click at least for  more Carbs or fat     Counseling time on subjects discussed above is over 50% of today's 25 minute visit   Hansford Hirt 08/04/2015, 2:49 PM   Note: This office note was prepared with Dragon voice recognition system technology. Any transcriptional errors that result from this process are unintentional.

## 2015-08-04 NOTE — Patient Instructions (Signed)
Check blood sugars on waking up 4-5  times a week  Also check blood sugars about 2 hours after a meal and do this after different meals by rotation  Recommended blood sugar levels on waking up is 90-130 and about 2 hours after meal is 130-160  Please bring your blood sugar monitor to each visit, thank you  Extra 1 click at least for more Carbs or fat

## 2015-08-04 NOTE — Patient Instructions (Signed)
Medication Instructions: Dr Royann Shiversroitoru has recommended making the following medication changes: 1. START Hydrochlorothiazide 25 mg - take 1 tablet by mouth daily  Labwork: Your physician recommends that you return for lab work in: 1 month  Testing/Procedures: NONE ORDERED  Follow-up: Dr Royann Shiversroitoru recommends that you schedule a follow-up appointment in 6 months. You will receive a reminder letter in the mail two months in advance. If you don't receive a letter, please call our office to schedule the follow-up appointment.  If you need a refill on your cardiac medications before your next appointment, please call your pharmacy.   Your physician has requested that you regularly monitor and record your blood pressure readings at home. Please use the same machine at the same time of day to check your readings and record them to bring to your follow-up visit. Please call to speak with a nurse in 3 weeks with your blood pressure readings.

## 2015-08-04 NOTE — Progress Notes (Signed)
Cardiology Office Note    Date:  08/06/2015   ID:  [name], DOB 14-Jan-1941, MRN [id number]  PCP:  Merrilee Seashore, MD  Cardiologist:   Sanda Klein, MD   Chief Complaint  Patient presents with  . Follow-up    patient reports swelling of legs and ankles-no relief,     History of Present Illness:  [name] is a 75 y.o. female with obesity, type 2 diabetes mellitus, previous history of TIA and possible ischemic colitis, hypertension. She has normal left ventricular systolic function and regional wall motion without valvular abnormalities. Roughly 18 months ago she had a normal nuclear stress test. As long-standing left anterior fascicular block  Generally she feels well. She walks 20 minutes every day. She has recently had problems with worsening leg edema and did not really noticed much increase in diuretic after taking 40 mg tablets of furosemide 2 days consecutively. Her weight has increased by about 18 pounds from her previous appointment. She denies shortness of breath either at rest or with exertion. There are some inconsistencies in the medication list when she presented. She is not taking diltiazem but is taking bisoprolol 10 mg daily. She is also taking hydralazine 25 mg twice daily.  Past Medical History  Diagnosis Date  . Thyroid disease   . Hypertension   . Ischemic colitis, enteritis, or enterocolitis ([place])   . Sleep apnea   . Urinary bladder incontinence   . GERD (gastroesophageal reflux disease)   . Hyperlipidemia   . Diabetes mellitus     diet controlled  . Stroke ([place])     x's 2  . Depression     Bipolar  . Vertigo     Past Surgical History  Procedure Laterality Date  . Retinal detachment surgery    . Appendectomy    . Tonsillectomy    . Hemorrhoid surgery    . Fracture surgery    . Cholecystectomy    . Abdominal hysterectomy      partial   . Cardiac catheterization N/A 12/24/2014    Procedure: Right/Left Heart Cath and Coronary  Angiography;  Surgeon: Sanda Klein, MD;  Location: [facility];  Service: Cardiovascular;  Laterality: N/A;    Current Medications: Outpatient Prescriptions Prior to Visit  Medication Sig Dispense Refill  . aspirin 325 MG tablet Take 325 mg by mouth daily with breakfast.     . Blood Glucose Monitoring Suppl (ONE TOUCH ULTRA 2) W/DEVICE KIT Use to check blood sugar 5 times per day Dx code E11.65 1 each 0  . carbamazepine (CARBATROL) 200 MG 12 hr capsule Take 200 mg by mouth 2 (two) times daily.     . Cholecalciferol (VITAMIN D) 2000 UNITS tablet Take 2,000 Units by mouth daily with breakfast.     . donepezil (ARICEPT) 10 MG tablet Take 10 mg by mouth daily with supper.     . esomeprazole (NEXIUM) 40 MG capsule Take 40 mg by mouth daily with supper.    . folic acid (FOLVITE) 1 MG tablet Take 1 mg by mouth daily.    Marland Kitchen glucose blood (ONE TOUCH ULTRA TEST) test strip Use as instructed to check blood sugar 5 times per day dx code E11.65 150 each 2  . Insulin Disposable Pump (V-GO 40) KIT USE ONE PER DAY AS DIRECTED 30 kit 3  . insulin lispro (HUMALOG) 100 UNIT/ML injection Inject 0.78 mLs (78 Units total) into the skin once. With V-go pump 30 mL 3  .  Insulin Pen Needle 32G X 4 MM MISC Use 8 pen needles per day 250 each 3  . levothyroxine (SYNTHROID, LEVOTHROID) 75 MCG tablet Take 75 mcg by mouth daily.      . methotrexate (RHEUMATREX) 2.5 MG tablet Take 2.5 mg by mouth once a week. Take 4 every Monday per patient    . nitroGLYCERIN (NITROSTAT) 0.4 MG SL tablet Place 0.4 mg under the tongue every 5 (five) minutes as needed for chest pain.     Marland Kitchen OLANZapine (ZYPREXA) 5 MG tablet Take 5 mg by mouth at bedtime.    Marland Kitchen olmesartan (BENICAR) 40 MG tablet Take 1 tablet (40 mg total) by mouth daily. 90 tablet 3  . ondansetron (ZOFRAN) 8 MG tablet Take 1 tablet (8 mg total) by mouth every 8 (eight) hours as needed for nausea. (Patient taking differently: Take 8 mg by mouth 2 (two) times daily as needed  for nausea. ) 9 tablet 0  . ONETOUCH DELICA LANCETS 42V MISC Use to check blood sugar 5 times per day dx code E11.65 200 each 2  . Pancrelipase, Lip-Prot-Amyl, (ZENPEP) 25000 UNITS CPEP Take 25,000 tablets by mouth See admin instructions. Reported on 08/04/2015    . solifenacin (VESICARE) 5 MG tablet Take 5 mg by mouth daily with breakfast.    . spironolactone (ALDACTONE) 25 MG tablet Take 25 mg by mouth daily.    . hydrALAZINE (APRESOLINE) 25 MG tablet TAKE 1 TABLET (25 MG TOTAL) BY MOUTH 3 (THREE) TIMES DAILY. <PLEASE MAKE SURE YOU COME TO  APPOINTMENT ON  08/04/15..AT [address] 90 tablet 3  . Liraglutide (VICTOZA) 18 MG/3ML SOPN INJECT (1.2 MG) DAILY AS DIRECTED 6 mL 3  . amLODipine (NORVASC) 10 MG tablet Take 1 tablet (10 mg total) by mouth daily. (Patient not taking: Reported on 08/04/2015) 90 tablet 3  . DiltiaZEM HCl Coated Beads (CARTIA XT PO) Take 1 tablet by mouth daily. Reported on 08/04/2015    . furosemide (LASIX) 40 MG tablet Take 2 tablets (80 mg total) by mouth daily. (Patient not taking: Reported on 08/04/2015) 180 tablet 3  . gabapentin (NEURONTIN) 300 MG capsule TAKE 1 CAPSULE (300 MG TOTAL) BY MOUTH 3 (THREE) TIMES DAILY. (Patient not taking: Reported on 08/04/2015) 90 capsule 3  . meclizine (ANTIVERT) 25 MG tablet Take 1 tablet (25 mg total) by mouth 3 (three) times daily as needed. (Patient not taking: Reported on 08/04/2015) 30 tablet 0  . Probiotic Product (PROBIOTIC DAILY PO) Take 1 tablet by mouth daily. Reported on 08/04/2015     No facility-administered medications prior to visit.     Allergies:   Flagyl; Ciprofloxacin; Metformin and related; Milk-related compounds; and Other   Social History   Social History  . Marital Status: Widowed    Spouse Name: N/A  . Number of Children: 2  . Years of Education: 16   Occupational History  . Retired    Social History Main Topics  . Smoking status: Never Smoker   . Smokeless tobacco: Never Used  . Alcohol Use: No  . Drug  Use: No  . Sexual Activity: Not Asked   Other Topics Concern  . None   Social History Narrative   Lives at home alone.   Right-handed.   No caffeine use.     Family History:  The patient's family history includes Cancer in her brother and sister; Heart disease in her mother; Parkinson's disease in her father; Stroke in her father.   ROS:   Please see  the history of present illness.    ROS All other systems reviewed and are negative.   PHYSICAL EXAM:   VS:  BP 144/76 mmHg  Pulse 77  Ht 5' 3"  (1.6 m)  Wt 105.96 kg (233 lb 9.6 oz)  BMI 41.39 kg/m2   GEN: Well nourished, well developed, in no acute distress HEENT: normal Neck: no JVD, carotid bruits, or masses Cardiac: RRR; no murmurs, rubs, or gallops, 1+ symmetrical edema of the ankles only Respiratory:  clear to auscultation bilaterally, normal work of breathing GI: soft, nontender, nondistended, + BS MS: no deformity or atrophy Skin: warm and dry, no rash Neuro:  Alert and Oriented x 3, Strength and sensation are intact Psych: euthymic mood, full affect  Wt Readings from Last 3 Encounters:  08/04/15 106.142 kg (234 lb)  08/04/15 105.96 kg (233 lb 9.6 oz)  06/09/15 106.414 kg (234 lb 9.6 oz)      Studies/Labs Reviewed:   EKG:  EKG is ordered today.  The ekg ordered today demonstrates Sinus rhythm, left axis deviation almost meeting criteria for left anterior fascicular block with inverted T waves in leads 1, aVL and V6, very similar to previous tracings  Recent Labs: 01/27/2015: Hemoglobin 14.0; Platelets 415.0*; Pro B Natriuretic peptide (BNP) 26.0; TSH 2.39 05/14/2015: ALT 29 07/31/2015: BUN 11; Creatinine, Ser 0.82; Potassium 4.9; Sodium 137   Lipid Panel    Component Value Date/Time   CHOL 153 04/26/2014 0923   TRIG 113.0 04/26/2014 0923   HDL 55.60 04/26/2014 0923   CHOLHDL 3 04/26/2014 0923   VLDL 22.6 04/26/2014 0923   LDLCALC 101 08/21/2014      ASSESSMENT:    1. Bilateral leg edema   2.  Essential hypertension, benign   3. Hyperlipemia   4. Medication management      PLAN:  In order of problems listed above:  1. We'll discontinue her hydralazine and add a daily diuretic for both the blood pressure and edema 2. Blood pressure control is fair, but the hydralazine may be responsible for her edema. Reevaluate blood pressure in 2 or 3 weeks. She'll check it at home and send her some results. 3. Get more recent lipid profile from her primary care physician 4. Check labs after starting hydrochlorothiazide    Medication Adjustments/Labs and Tests Ordered: Current medicines are reviewed at length with the patient today.  Concerns regarding medicines are outlined above.  Medication changes, Labs and Tests ordered today are listed in the Patient Instructions below. Patient Instructions  Medication Instructions: Dr Sallyanne Kuster has recommended making the following medication changes: 1. START Hydrochlorothiazide 25 mg - take 1 tablet by mouth daily  Labwork: Your physician recommends that you return for lab work in: 1 month  Testing/Procedures: NONE ORDERED  Follow-up: Dr Sallyanne Kuster recommends that you schedule a follow-up appointment in 6 months. You will receive a reminder letter in the mail two months in advance. If you don't receive a letter, please call our office to schedule the follow-up appointment.  If you need a refill on your cardiac medications before your next appointment, please call your pharmacy.   Your physician has requested that you regularly monitor and record your blood pressure readings at home. Please use the same machine at the same time of day to check your readings and record them to bring to your follow-up visit. Please call to speak with a nurse in 3 weeks with your blood pressure readings.     Signed, Sanda Klein, MD  08/06/2015 7:41  PM    [facility] [place], [place],   [address] Phone: [phone]; Fax:  [phone]

## 2015-08-06 ENCOUNTER — Telehealth: Payer: Self-pay

## 2015-08-06 NOTE — Telephone Encounter (Signed)
Message was in error, please disregard

## 2015-08-06 NOTE — Telephone Encounter (Signed)
When would you like for patient to be seen and I will schedule her. Thank you!

## 2015-09-04 ENCOUNTER — Encounter: Payer: Self-pay | Admitting: Cardiovascular Disease

## 2015-09-11 ENCOUNTER — Telehealth: Payer: Self-pay | Admitting: Cardiovascular Disease

## 2015-09-11 NOTE — Telephone Encounter (Signed)
Patient submitted a log of home blood pressure recordings, with high of 191/106 and low of 120/58. Most systolic blood pressure recordings are 150 or above, frequent diastolic blood pressures in the 90s and 100s. Will add amlodipine 5 mg daily and ask her to call us back with home recordings in another couple of weeks. If this works well for her, consider consolidating her antihypertensive regimen to tribenzor 40/5/25 or generic equivalent

## 2015-09-12 ENCOUNTER — Telehealth: Payer: Self-pay

## 2015-09-12 DIAGNOSIS — Z79899 Other long term (current) drug therapy: Secondary | ICD-10-CM

## 2015-09-12 DIAGNOSIS — I10 Essential (primary) hypertension: Secondary | ICD-10-CM

## 2015-09-12 NOTE — Telephone Encounter (Signed)
-----   Message from Thurmon FairMihai Croitoru, MD sent at 09/11/2015  1:04 PM EDT ----- Let her Know I got her blood pressure readings and I think her blood pressure is unfortunately still too high. Would like to add amlodipine 5 mg once daily. If this works well for her, could consider consolidating to olmesartan/amlodipine/hydrochlorothiazide 40/5/25 (generic tribenzor). Thanks

## 2015-09-12 NOTE — Telephone Encounter (Signed)
Called patient with recommendations. Patient states that she has taken this in the past and was told by a 'pharmacologist from [facility]' to stop due to swelling. States that she stopped this medication ~2 months ago. Notified patient that [facility] and call back with any changes.

## 2015-09-13 NOTE — Telephone Encounter (Signed)
Not a lot of other great options. Please ask to increase spironolactone to 50 mg daily and check BMET in 3 weeks please.

## 2015-09-16 MED ORDER — SPIRONOLACTONE 50 MG PO TABS: 50.0000 mg | ORAL_TABLET | Freq: Every day | ORAL | 3 refills | Status: DC

## 2015-09-16 NOTE — Telephone Encounter (Signed)
Called patient with recommendations. Patient verbalized understanding and agreed with plan. Refill for Spironolactone 50 mg sent to patient's preferred pharmacy electronically. Labs ordered, printed, and mailed to patient.

## 2015-10-14 ENCOUNTER — Ambulatory Visit: Payer: Medicare Other | Attending: Internal Medicine | Admitting: Physical Therapy

## 2015-10-14 DIAGNOSIS — R29898 Other symptoms and signs involving the musculoskeletal system: Secondary | ICD-10-CM | POA: Insufficient documentation

## 2015-10-14 DIAGNOSIS — I890 Lymphedema, not elsewhere classified: Secondary | ICD-10-CM

## 2015-10-14 NOTE — Therapy (Signed)
[facility] [address] [place], [place], [address] Phone: [phone]   Fax:  [phone]  Physical Therapy Evaluation  Patient Details  Name: [name] MRN: [id number] Date of Birth: Jun 25, 1940 Referring Provider: Charna ElizabethLaura Ann Previll, MD  Encounter Date: 10/14/2015      PT End of Session - 10/14/15 1715    Visit Number 1   Number of Visits 9   Date for PT Re-Evaluation 11/14/15   PT Start Time 1603   PT Stop Time 1650   PT Time Calculation (min) 47 min   Activity Tolerance Patient tolerated treatment well   Behavior During Therapy [facility] for tasks assessed/performed      Past Medical History:  Diagnosis Date  . Depression    Bipolar  . Diabetes mellitus    diet controlled  . GERD (gastroesophageal reflux disease)   . Hyperlipidemia   . Hypertension   . Ischemic colitis, enteritis, or enterocolitis ([facility])   . Sleep apnea   . Stroke ([facility])    x's 2  . Thyroid disease   . Urinary bladder incontinence   . Vertigo     Past Surgical History:  Procedure Laterality Date  . ABDOMINAL HYSTERECTOMY     partial   . APPENDECTOMY    . CARDIAC CATHETERIZATION N/A 12/24/2014   Procedure: Right/Left Heart Cath and Coronary Angiography;  Surgeon: Thurmon FairMihai Croitoru, MD;  Location: [facility];  Service: Cardiovascular;  Laterality: N/A;  . CHOLECYSTECTOMY    . FRACTURE SURGERY    . HEMORRHOID SURGERY    . RETINAL DETACHMENT SURGERY    . TONSILLECTOMY      There were no vitals filed for this visit.       Subjective Assessment - 10/14/15 1609    Subjective I have been having extreme swelling in my legs and I've been taking lasix, which hasn't been working. Was told by a "pharmacologist doctor" who told her it was lymphedema and took her off multiple medications in June (amlodipene, meclizine, and 1 more medication), that seemed to really help. Swelling has significantly decreased. The doctor told her she needed  to see physical therapy. Patient is currently having some issues with diarrhea.   Pertinent History Patient has a history of gallbladder surgery and abdominal hysterectomy, diet-controlled diabetes, ischemic colitis, and 2 previous strokes.   Patient Stated Goals to know that the swelling is under control and what I can do to keep it that way   Currently in Pain? No/denies            [facility] Assessment - 10/14/15 0001      Assessment   Medical Diagnosis bilateral LE lymphedema  not cancer-related   Referring Provider Charna ElizabethLaura Ann Previll, MD   Prior Therapy for vertigo     Restrictions   Weight Bearing Restrictions No     Balance Screen   Has the patient fallen in the past 6 months No   Has the patient had a decrease in activity level because of a fear of falling?  No   Is the patient reluctant to leave their home because of a fear of falling?  No     Home Environment   Living Environment Private residence   Living Arrangements Alone  her sons live out of state   Type of Home Other(Comment)  condo   Home Access Other (comment)  no stairs     Prior Function   Level of Independence Independent   Leisure  states she tries to walk 20-30 minutes around neighborhood 3-4 times per week; is interested in participating in [employer]     Cognition   Overall Cognitive Status Within Functional Limits for tasks assessed     Observation/Other Assessments   Observations noticeable pitting edema of bilateral LE, especially in the ankle   Skin Integrity skin of bilateral LE below the knees is dry and scaly with a noticeable rash   Other Surveys  --  LLIS: 49% impairment     Posture/Postural Control   Posture/Postural Control Postural limitations   Postural Limitations Rounded Shoulders;Forward head     Ambulation/Gait   Ambulation/Gait Yes   Ambulation/Gait Assistance 7: Independent           LYMPHEDEMA/ONCOLOGY QUESTIONNAIRE - 10/14/15 1624      Right Lower Extremity  Lymphedema   At Midpatella/Popliteal Crease 43.5 cm   30 cm Proximal to Floor at Lateral Plantar Foot 41 cm   20 cm Proximal to Floor at Lateral Plantar Foot 30.8 1   10  cm Proximal to Floor at Lateral Malleoli 26.3 cm   5 cm Proximal to 1st MTP Joint 22.8 cm   Across MTP Joint 21.3 cm   Around Proximal Great Toe 7.1 cm     Left Lower Extremity Lymphedema   At Midpatella/Popliteal Crease 44.5 cm   30 cm Proximal to Floor at Lateral Plantar Foot 40.9 cm   20 cm Proximal to Floor at Lateral Plantar Foot 31.1 cm   10 cm Proximal to Floor at Lateral Malleoli 26.1 cm   5 cm Proximal to 1st MTP Joint 23.2 cm   Across MTP Joint 21.1 cm   Around Proximal Great Toe 7.4 cm                                Long Term Clinic Goals - 10/14/15 1724      CC Long Term Goal  #1   Title Patient will be knowledgeable in lymphedema risk reduction practices.   Time 4   Period Weeks   Status New     CC Long Term Goal  #2   Title Patient will obtain compression garments and be independent in donning/doffing them.   Time 4   Period Weeks   Status New     CC Long Term Goal  #3   Title Patient will be knowledgeable in conservative management of lymphedema.   Time 4   Period Weeks   Status New            Plan - 10/14/15 1716    Clinical Impression Statement Patient is a 75 year old female with bilateral lower extremity swelling below her knees. Patient is unsure of the cause of the swelling. She reports the swelling was much worse than it is now, but back in June her doctor took her off 3 medications and the swelling immediately began to reduce. She still has some residual swelling. She has not yet been able to obtain compression stockings.   Rehab Potential Good   Clinical Impairments Affecting Rehab Potential none   PT Frequency 2x / week   PT Duration 4 weeks   PT Treatment/Interventions ADLs/Self Care Home Management;Therapeutic exercise;Therapeutic activities;DME  Instruction;Patient/family education;Manual techniques;Manual lymph drainage;Compression bandaging;Passive range of motion;Taping   PT Next Visit Plan Instruct patient on conservative management of edema (elevation, exercise, compression stockings); instruct patient on donning/doffing compression garments if needed; educate on lymphedema  risk reduction   Consulted and Agree with Plan of Care Patient      Patient will benefit from skilled therapeutic intervention in order to improve the following deficits and impairments:  Increased edema, Postural dysfunction  Visit Diagnosis: Lymphedema, not elsewhere classified  Other symptoms and signs involving the musculoskeletal system     Problem List Patient Active Problem List   Diagnosis Date Noted  . Dyspnea 01/27/2015  . Atypical chest pain 12/24/2014  . SOB (shortness of breath)   . Accelerating angina ([facility]) 12/23/2014  . Chest pain at rest 10/30/2013  . Exertional dyspnea 10/30/2013  . Hyperlipidemia 10/30/2013  . Morbid obesity ([facility]) 10/30/2013  . Acquired autoimmune hypothyroidism 09/02/2013  . Type II or unspecified type diabetes mellitus without mention of complication, uncontrolled 06/14/2013  . Essential hypertension, benign 06/14/2013  . Hyperlipemia 06/14/2013    Tye Savoy 10/14/2015, 5:27 PM  [facility] [address] [place], [place], [address] Phone: [phone]   Fax:  [phone]  Name: [name] MRN: [id number] Date of Birth: 1940-02-03  Jannetta Quint, SPT

## 2015-10-15 ENCOUNTER — Other Ambulatory Visit: Payer: Self-pay | Admitting: Gastroenterology

## 2015-10-15 DIAGNOSIS — R1084 Generalized abdominal pain: Secondary | ICD-10-CM

## 2015-10-15 DIAGNOSIS — R748 Abnormal levels of other serum enzymes: Secondary | ICD-10-CM

## 2015-10-15 DIAGNOSIS — K921 Melena: Secondary | ICD-10-CM

## 2015-10-15 DIAGNOSIS — R197 Diarrhea, unspecified: Secondary | ICD-10-CM

## 2015-10-16 ENCOUNTER — Ambulatory Visit
Admission: RE | Admit: 2015-10-16 | Discharge: 2015-10-16 | Disposition: A | Discharge status: Home / Self Care | Payer: Medicare Other | Source: Ambulatory Visit | Attending: Gastroenterology | Admitting: Gastroenterology

## 2015-10-16 DIAGNOSIS — R1084 Generalized abdominal pain: Secondary | ICD-10-CM

## 2015-10-16 DIAGNOSIS — R197 Diarrhea, unspecified: Secondary | ICD-10-CM

## 2015-10-16 DIAGNOSIS — R748 Abnormal levels of other serum enzymes: Secondary | ICD-10-CM

## 2015-10-16 DIAGNOSIS — K921 Melena: Secondary | ICD-10-CM

## 2015-10-16 MED ORDER — IOPAMIDOL (ISOVUE-300) INJECTION 61%
125.0000 mL | Freq: Once | INTRAVENOUS | Status: AC | PRN
  Administered 2015-10-16: 125 mL via INTRAVENOUS

## 2015-10-21 ENCOUNTER — Ambulatory Visit: Payer: Medicare Other | Attending: Internal Medicine | Admitting: Physical Therapy

## 2015-10-21 DIAGNOSIS — R29898 Other symptoms and signs involving the musculoskeletal system: Secondary | ICD-10-CM | POA: Diagnosis present

## 2015-10-21 DIAGNOSIS — I890 Lymphedema, not elsewhere classified: Secondary | ICD-10-CM | POA: Insufficient documentation

## 2015-10-21 NOTE — Therapy (Signed)
[facility] [address] [place], [place], [address] Phone: [phone]   Fax:  [phone]  Physical Therapy Treatment  Patient Details  Name: [name] MRN: [id number] Date of Birth: 06/30/1940 Referring Provider: Charna Elizabeth, MD  Encounter Date: 10/21/2015      PT End of Session - 10/21/15 1500    Visit Number 2   Number of Visits 9   Date for PT Re-Evaluation 11/14/15   PT Start Time 1300   PT Stop Time 1351   PT Time Calculation (min) 51 min   Activity Tolerance Patient tolerated treatment well   Behavior During Therapy [facility] for tasks assessed/performed      Past Medical History:  Diagnosis Date  . Depression    Bipolar  . Diabetes mellitus    diet controlled  . GERD (gastroesophageal reflux disease)   . Hyperlipidemia   . Hypertension   . Ischemic colitis, enteritis, or enterocolitis (HCC)   . Sleep apnea   . Stroke (HCC)    x's 2  . Thyroid disease   . Urinary bladder incontinence   . Vertigo     Past Surgical History:  Procedure Laterality Date  . ABDOMINAL HYSTERECTOMY     partial   . APPENDECTOMY    . CARDIAC CATHETERIZATION N/A 12/24/2014   Procedure: Right/Left Heart Cath and Coronary Angiography;  Surgeon: Thurmon Fair, MD;  [facility];  Service: Cardiovascular;  Laterality: N/A;  . CHOLECYSTECTOMY    . FRACTURE SURGERY    . HEMORRHOID SURGERY    . RETINAL DETACHMENT SURGERY    . TONSILLECTOMY      There were no vitals filed for this visit.      Subjective Assessment - 10/21/15 1303    Subjective Nothing new.  I'm very pleased--I got the socks.  They're very had to get on and off.  Got the socks last week so has had several days of wearing them every day.  "As the day wears on the swelling begins and by the evening when I remove them, they're swollen, but less swollen than without the socks."   Currently in Pain? No/denies                          [facility] Adult PT Treatment/Exercise - 10/21/15 0001      Self-Care   Self-Care Other Self-Care Comments   Other Self-Care Comments  Pt. reported difficulty donning and doffing new knee-high compression stockings.  Time spend seeing how she does this and trying to find modifications of process that would make it easier for her.  Focused on trial of different donning butlers and different set-ups of donning butlers, but we were unable to come up with anything that patient though was better than what she can do manually.  Reviewed with patient that she should elevate legs several times a day, which she reports doing 4x/day for an hour, and also to do ankle pumps and ankle circles, which she also reports doing.                        Long Term Clinic Goals - 10/21/15 1506      CC Long Term Goal  #2   Title Patient will obtain compression garments and be independent in donning/doffing them.   Status Achieved            Plan - 10/21/15 1501  Clinical Impression Statement Patient came in today with her new knee-high compression stockings reporting that she has worn them but has difficulty both donning and doffing; unfortunately, we were not able to come up with any way for her to do this that seemed easier to her than the manual technique she currently uses.  She expressed some doubt about how much they helped, finding that her legs still swell as the day goes on.  What may help this is a stiffer compression garment fabric, but these would be even more difficult for her to don and doff.  We briefly discussed the availability of velcro compression wraps that might be easier to don and doff, and plan to show her samples and talk more about options.  She also said she came in expecting to have manual lymph drainage done today.  I explained that we can do this and that it may help a little, but probably not a dramatic amount.   Rehab Potential Good    Clinical Impairments Affecting Rehab Potential none   PT Frequency 2x / week   PT Duration 4 weeks   PT Treatment/Interventions ADLs/Self Care Home Management;Therapeutic exercise;Therapeutic activities;DME Instruction;Patient/family education;Manual techniques;Manual lymph drainage;Compression bandaging;Passive range of motion;Taping   PT Next Visit Plan Show patient samples and/or pictures of velcro compression garments such as Juzo compression wrap, Circaid or Ready Wrap to see if she is interested in pursuing this type of garment.  Try manual lymph drainage to see if patient finds it beneficial; if so, consider teaching patient to do this herself, if she is able to reach her distal legs to do it on her own (she has trouble reaching her feet to don stockings).  Later go over lymphedema risk reduction practices.   PT Home Exercise Plan ankle pumps, active ankle circles   Consulted and Agree with Plan of Care Patient      Patient will benefit from skilled therapeutic intervention in order to improve the following deficits and impairments:  Increased edema, Postural dysfunction  Visit Diagnosis: Lymphedema, not elsewhere classified     Problem List Patient Active Problem List   Diagnosis Date Noted  . Dyspnea 01/27/2015  . Atypical chest pain 12/24/2014  . SOB (shortness of breath)   . Accelerating angina (HCC) 12/23/2014  . Chest pain at rest 10/30/2013  . Exertional dyspnea 10/30/2013  . Hyperlipidemia 10/30/2013  . Morbid obesity (HCC) 10/30/2013  . Acquired autoimmune hypothyroidism 09/02/2013  . Type II or unspecified type diabetes mellitus without mention of complication, uncontrolled 06/14/2013  . Essential hypertension, benign 06/14/2013  . Hyperlipemia 06/14/2013    Sadonna Kotara 10/21/2015, 3:07 PM  [facility] [address] [place], [place], [address] Phone: [phone]   Fax:  [phone]  Name: Clayborn BignessShirley  H Burridge MRN: [id number] Date of Birth: [birth year]  Micheline MazeDonna Karin Pinedo, PT 10/21/15 3:08 PM

## 2015-10-22 ENCOUNTER — Ambulatory Visit: Payer: Medicare Other

## 2015-10-22 DIAGNOSIS — I890 Lymphedema, not elsewhere classified: Secondary | ICD-10-CM

## 2015-10-22 NOTE — Therapy (Signed)
[facility] [address] [place], [place], [address] Phone: [phone]   Fax:  [phone]  Physical Therapy Treatment  Patient Details  Name: [name] MRN: [id number] Date of Birth: 1940-02-27 Referring Provider: Charna Elizabeth, MD  Encounter Date: 10/22/2015      PT End of Session - 10/22/15 1545    Visit Number 3   Number of Visits 9   Date for PT Re-Evaluation 11/14/15   PT Start Time 1522   PT Stop Time 1605   PT Time Calculation (min) 43 min   Activity Tolerance Patient tolerated treatment well   Behavior During Therapy [facility] for tasks assessed/performed      Past Medical History:  Diagnosis Date  . Depression    Bipolar  . Diabetes mellitus    diet controlled  . GERD (gastroesophageal reflux disease)   . Hyperlipidemia   . Hypertension   . Ischemic colitis, enteritis, or enterocolitis (HCC)   . Sleep apnea   . Stroke (HCC)    x's 2  . Thyroid disease   . Urinary bladder incontinence   . Vertigo     Past Surgical History:  Procedure Laterality Date  . ABDOMINAL HYSTERECTOMY     partial   . APPENDECTOMY    . CARDIAC CATHETERIZATION N/A 12/24/2014   Procedure: Right/Left Heart Cath and Coronary Angiography;  Surgeon: Thurmon Fair, MD;  [facility];  Service: Cardiovascular;  Laterality: N/A;  . CHOLECYSTECTOMY    . FRACTURE SURGERY    . HEMORRHOID SURGERY    . RETINAL DETACHMENT SURGERY    . TONSILLECTOMY      There were no vitals filed for this visit.      Subjective Assessment - 10/22/15 1539    Subjective Still having trouble getting the stockings on. Every morning I say I'm not going to wear them but decide to do it anyways. I think they are helping some with the end of day swelling, but not sure yet if it's much.    Pertinent History Patient has a history of gallbladder surgery and abdominal hysterectomy, diet-controlled diabetes, ischemic colitis, and 2  previous strokes.   Patient Stated Goals to know that the swelling is under control and what I can do to keep it that way   Currently in Pain? No/denies                         [facility] Adult PT Treatment/Exercise - 10/22/15 0001      Manual Therapy   Manual Lymphatic Drainage (MLD) In Supine: Short neck, 5 diaphragmatic breaths, Rt axillary nodes, Rt inguino-axillary anastomosis, Rt inguinal nodes, and Rt LE from thigh to dorsum of foot working proximal to distal then retracing all steps beginning to instruct pt throughout in basic principles and anatomy of lymphatic system; then performed same on Lt side as well. All performed by Jannetta Quint, SPT while being directly observed by Berna Spare, PTA.                PT Education - 10/22/15 1612    Education provided Yes   Education Details Educated pt about Velcro compression garments, showing her CirCaid JuxtaLite on [email or web address]. She is very interested in pursuing this at next weeks appt. This will give her time to wear the compression stockings a little longer to see if they alone will help reduce her swelling. If not then we can measure and order this for  her from the website at next appt as she reports can just pay for these out of pocket.    Person(s) Educated Patient   Methods Explanation;Other (comment)  Showed pt picture on website of garment   Comprehension Verbalized understanding                Long Term Clinic Goals - 10/21/15 1506      CC Long Term Goal  #2   Title Patient will obtain compression garments and be independent in donning/doffing them.   Status Achieved            Plan - 10/22/15 1545    Clinical Impression Statement Pt came in wearing her knee high compression stockings reporting they are somewhat comfortable but still a struggle to get on and she is still unsure of how much they are helping to control her swelling. She tolerated first session of manual  lymph drainage well and verbalized good understanding of basic principles of technique and principles of this.    Rehab Potential Good   Clinical Impairments Affecting Rehab Potential none   PT Frequency 2x / week   PT Duration 4 weeks   PT Treatment/Interventions ADLs/Self Care Home Management;Therapeutic exercise;Therapeutic activities;DME Instruction;Patient/family education;Manual techniques;Manual lymph drainage;Compression bandaging;Passive range of motion;Taping   PT Next Visit Plan If pt still interested measure her for CirCaid JuxatLite for lower leg with foot/ankle piece and order from [email or web address] for pt as she reports not having a computer at home. See if pt felt manual lymph drainage was beneficial from todays treatment, if so instruct her in this (as far as she is able to distally reach, may only be able to instruct to knee of LE) and issue handout. Later go over lymphedema risk reduction practices.       Patient will benefit from skilled therapeutic intervention in order to improve the following deficits and impairments:  Increased edema, Postural dysfunction  Visit Diagnosis: Lymphedema, not elsewhere classified     Problem List Patient Active Problem List   Diagnosis Date Noted  . Dyspnea 01/27/2015  . Atypical chest pain 12/24/2014  . SOB (shortness of breath)   . Accelerating angina (HCC) 12/23/2014  . Chest pain at rest 10/30/2013  . Exertional dyspnea 10/30/2013  . Hyperlipidemia 10/30/2013  . Morbid obesity (HCC) 10/30/2013  . Acquired autoimmune hypothyroidism 09/02/2013  . Type II or unspecified type diabetes mellitus without mention of complication, uncontrolled 06/14/2013  . Essential hypertension, benign 06/14/2013  . Hyperlipemia 06/14/2013    Hermenia Bersosenberger, Milady Fleener Ann, PTA 10/22/2015, 4:18 PM  [facility] [address] [place], [place], [address] Phone: [phone]   Fax:   [phone]  Name: [name] MRN: [id number] Date of Birth: 1940/03/08

## 2015-10-27 ENCOUNTER — Ambulatory Visit: Payer: Medicare Other | Admitting: Physical Therapy

## 2015-10-27 DIAGNOSIS — R29898 Other symptoms and signs involving the musculoskeletal system: Secondary | ICD-10-CM

## 2015-10-27 DIAGNOSIS — I890 Lymphedema, not elsewhere classified: Secondary | ICD-10-CM | POA: Diagnosis not present

## 2015-10-27 NOTE — Therapy (Signed)
[facility] [address] [place], [place], [address] Phone: [phone]   Fax:  [phone]  Physical Therapy Treatment  Patient Details  Name: [name] MRN: [id number] Date of Birth: 1940-05-27 Referring Provider: Charna Elizabeth, MD  Encounter Date: 10/27/2015      PT End of Session - 10/27/15 1024    Visit Number 4   Number of Visits 9   Date for PT Re-Evaluation 11/14/15   PT Start Time 0932   PT Stop Time 1020   PT Time Calculation (min) 48 min   Activity Tolerance Patient tolerated treatment well   Behavior During Therapy [facility] for tasks assessed/performed      Past Medical History:  Diagnosis Date  . Depression    Bipolar  . Diabetes mellitus    diet controlled  . GERD (gastroesophageal reflux disease)   . Hyperlipidemia   . Hypertension   . Ischemic colitis, enteritis, or enterocolitis (HCC)   . Sleep apnea   . Stroke (HCC)    x's 2  . Thyroid disease   . Urinary bladder incontinence   . Vertigo     Past Surgical History:  Procedure Laterality Date  . ABDOMINAL HYSTERECTOMY     partial   . APPENDECTOMY    . CARDIAC CATHETERIZATION N/A 12/24/2014   Procedure: Right/Left Heart Cath and Coronary Angiography;  Surgeon: Thurmon Fair, MD;  [facility];  Service: Cardiovascular;  Laterality: N/A;  . CHOLECYSTECTOMY    . FRACTURE SURGERY    . HEMORRHOID SURGERY    . RETINAL DETACHMENT SURGERY    . TONSILLECTOMY      There were no vitals filed for this visit.      Subjective Assessment - 10/27/15 0935    Subjective (P)  "I saw a huge improvement in the swelling in my legs after last session. And I am doing better with getting my stockings on and off."   Pertinent History (P)  Patient has a history of gallbladder surgery and abdominal hysterectomy, diet-controlled diabetes, ischemic colitis, and 2 previous strokes.   Patient Stated Goals (P)  to know that the swelling is  under control and what I can do to keep it that way   Currently in Pain? (P)  No/denies                         [facility] Adult PT Treatment/Exercise - 10/27/15 0001      Manual Therapy   Manual Lymphatic Drainage (MLD) In Supine: Short neck, 5 diaphragmatic breaths, Rt axillary nodes, Rt inguino-axillary anastomosis, and Rt LE from thigh to dorsum of foot working proximal to distal then retracing all steps while instructing pt throughout in basic principles and anatomy of lymphatic system; then performed same on Lt side as well.                        Long Term Clinic Goals - 10/21/15 1506      CC Long Term Goal  #2   Title Patient will obtain compression garments and be independent in donning/doffing them.   Status Achieved            Plan - 10/27/15 1024    Clinical Impression Statement Patient reports she is doing much better getting her compression stockings on and does not believe she needs to purchase a velcro garment. She states the manual lymph drainage really seemed to help  last session and that she wanted the therapist to perform the manual lymph drainage again this session with the plan being to teach her self manual lymph drainage next visit.   Rehab Potential Good   PT Frequency 2x / week   PT Duration 4 weeks   PT Treatment/Interventions ADLs/Self Care Home Management;Therapeutic exercise;Therapeutic activities;DME Instruction;Patient/family education;Manual techniques;Manual lymph drainage;Compression bandaging;Passive range of motion;Taping   PT Next Visit Plan circumference measurements; instruct patient in self manual lymph drainage and issue handout; go over lymphedema risk reduction   Consulted and Agree with Plan of Care Patient      Patient will benefit from skilled therapeutic intervention in order to improve the following deficits and impairments:  Increased edema, Postural dysfunction  Visit Diagnosis: Lymphedema, not  elsewhere classified  Other symptoms and signs involving the musculoskeletal system     Problem List Patient Active Problem List   Diagnosis Date Noted  . Dyspnea 01/27/2015  . Atypical chest pain 12/24/2014  . SOB (shortness of breath)   . Accelerating angina (HCC) 12/23/2014  . Chest pain at rest 10/30/2013  . Exertional dyspnea 10/30/2013  . Hyperlipidemia 10/30/2013  . Morbid obesity (HCC) 10/30/2013  . Acquired autoimmune hypothyroidism 09/02/2013  . Type II or unspecified type diabetes mellitus without mention of complication, uncontrolled 06/14/2013  . Essential hypertension, benign 06/14/2013  . Hyperlipemia 06/14/2013    Tye SavoyLeslie Allison Jacquelynne Guedes 10/27/2015, 10:27 AM  [facility] [address] [place], [place], [address] Phone: [phone]   Fax:  [phone]  Name: [name] MRN: [id number] Date of Birth: 1940/02/02   Jannetta Quintllison Raider Valbuena, SPT  This entire session was guided, instructed, and directly supervised by Micheline Mazeonna Salisbury, PT.  Read, reviewed, edited and agree with student's findings and recommendations.   Micheline Mazeonna Salisbury, PT 10/27/15 11:25 AM

## 2015-10-31 ENCOUNTER — Encounter: Payer: Self-pay | Admitting: Physical Therapy

## 2015-10-31 ENCOUNTER — Ambulatory Visit: Payer: Medicare Other | Admitting: Physical Therapy

## 2015-10-31 ENCOUNTER — Other Ambulatory Visit (INDEPENDENT_AMBULATORY_CARE_PROVIDER_SITE_OTHER): Payer: Medicare Other

## 2015-10-31 DIAGNOSIS — E1165 Type 2 diabetes mellitus with hyperglycemia: Secondary | ICD-10-CM | POA: Diagnosis not present

## 2015-10-31 DIAGNOSIS — I890 Lymphedema, not elsewhere classified: Secondary | ICD-10-CM | POA: Diagnosis not present

## 2015-10-31 DIAGNOSIS — Z794 Long term (current) use of insulin: Secondary | ICD-10-CM | POA: Diagnosis not present

## 2015-10-31 LAB — COMPREHENSIVE METABOLIC PANEL
ALBUMIN: 4 g/dL (ref 3.5–5.2)
ALK PHOS: 93 U/L (ref 39–117)
ALT: 28 U/L (ref 0–35)
AST: 27 U/L (ref 0–37)
BILIRUBIN TOTAL: 0.3 mg/dL (ref 0.2–1.2)
BUN: 13 mg/dL (ref 6–23)
CO2: 27 mEq/L (ref 19–32)
CREATININE: 0.97 mg/dL (ref 0.40–1.20)
Calcium: 9.3 mg/dL (ref 8.4–10.5)
Chloride: 94 mEq/L — ABNORMAL LOW (ref 96–112)
GFR: 59.59 mL/min — ABNORMAL LOW (ref >60.00)
Glucose, Bld: 212 mg/dL — ABNORMAL HIGH (ref 70–99)
POTASSIUM: 4.4 meq/L (ref 3.5–5.1)
SODIUM: 129 meq/L — AB (ref 135–145)
TOTAL PROTEIN: 6.6 g/dL (ref 6.0–8.3)

## 2015-10-31 LAB — HEMOGLOBIN A1C: HEMOGLOBIN A1C: 8.1 % — AB (ref 4.6–6.5)

## 2015-10-31 NOTE — Therapy (Signed)
[facility] [address] [place], [place], [address] Phone: [phone]   Fax:  [phone]  Physical Therapy Treatment  Patient Details  Name: Mary BignessShirley H Trimm MRN: [id number] Date of Birth: 03/28/1940 Referring Provider: Charna ElizabethLaura Ann Previll, MD  Encounter Date: 10/31/2015      PT End of Session - 10/31/15 1149    Visit Number 5   Number of Visits 9   Date for PT Re-Evaluation 11/14/15   PT Start Time 1054   PT Stop Time 1146   PT Time Calculation (min) 52 min   Activity Tolerance Patient tolerated treatment well   Behavior [facility] Therapy [facility] for tasks assessed/performed      Past Medical History:  Diagnosis Date  . Depression    Bipolar  . Diabetes mellitus    diet controlled  . GERD (gastroesophageal reflux disease)   . Hyperlipidemia   . Hypertension   . Ischemic colitis, enteritis, or enterocolitis (HCC)   . Sleep apnea   . Stroke (HCC)    x's 2  . Thyroid disease   . Urinary bladder incontinence   . Vertigo     Past Surgical History:  Procedure Laterality Date  . ABDOMINAL HYSTERECTOMY     partial   . APPENDECTOMY    . CARDIAC CATHETERIZATION N/A 12/24/2014   Procedure: Right/Left Heart Cath and Coronary Angiography;  Surgeon: Thurmon FairMihai Croitoru, MD;  [facility];  Service: Cardiovascular;  Laterality: N/A;  . CHOLECYSTECTOMY    . FRACTURE SURGERY    . HEMORRHOID SURGERY    . RETINAL DETACHMENT SURGERY    . TONSILLECTOMY      There were no vitals filed for this visit.      Subjective Assessment - 10/31/15 1056    Subjective I can get my shoes on today. I am doing better getting the stockings on because it has gone down. I am having trouble getting my ankles down but my legs are fine.    Pertinent History Patient has a history of gallbladder surgery and abdominal hysterectomy, diet-controlled diabetes, ischemic colitis, and 2 previous strokes.   Patient Stated Goals to know that  the swelling is under control and what I can do to keep it that way   Currently in Pain? No/denies               LYMPHEDEMA/ONCOLOGY QUESTIONNAIRE - 10/31/15 1058      Right Lower Extremity Lymphedema   At Midpatella/Popliteal Crease 41.1 cm   30 cm Proximal to Floor at Lateral Plantar Foot 42.5 cm   20 cm Proximal to Floor at Lateral Plantar Foot 33.1 1   10  cm Proximal to Floor at Lateral Malleoli 25.5 cm   5 cm Proximal to 1st MTP Joint 22.5 cm   Across MTP Joint 21.5 cm   Around Proximal Great Toe 7.5 cm     Left Lower Extremity Lymphedema   At Midpatella/Popliteal Crease 41 cm   30 cm Proximal to Floor at Lateral Plantar Foot 41.7 cm   20 cm Proximal to Floor at Lateral Plantar Foot 31.8 cm   10 cm Proximal to Floor at Lateral Malleoli 25.5 cm   5 cm Proximal to 1st MTP Joint 22.9 cm   Across MTP Joint 22 cm   Around Proximal Great Toe 8 cm                  [facility] Adult PT Treatment/Exercise - 10/31/15 0001  Manual Therapy   Manual Lymphatic Drainage (MLD) In Supine: Short neck, 5 diaphragmatic breaths, Rt axillary nodes, Rt inguino-axillary anastomosis, and Rt LE from thigh to dorsum of foot working proximal to distal then retracing all steps while instructing pt throughout and had patient follow along with handout and demonstrate technique - repeated on L                        Long Term Clinic Goals - 10/31/15 1148      CC Long Term Goal  #1   Title Patient will be knowledgeable in lymphedema risk reduction practices.   Time 4   Period Weeks   Status On-going     CC Long Term Goal  #2   Title Patient will obtain compression garments and be independent in donning/doffing them.   Time 4   Period Weeks   Status Achieved     CC Long Term Goal  #3   Title Patient will be knowledgeable in conservative management of lymphedema.   Baseline 10/31/15- instructed patient in self MLD technique today and had patient return demonstrate -  issued handout   Time 4   Period Weeks   Status On-going            Plan - 10/31/15 1149    Clinical Impression Statement Patient reports she is now able to don her shoes for the first time in a long time. She reports her ankles are the only problem now. CIrcumferential measurements taken today - overall measurements went down bilaterally but a few increased. Instructed patient in self MLD today and had patient return demonstrate. Issued pt handout on this and asked patient to perform over the weekend and ask questions next session.    Rehab Potential Good   Clinical Impairments Affecting Rehab Potential none   PT Frequency 2x / week   PT Duration 4 weeks   PT Treatment/Interventions ADLs/Self Care Home Management;Therapeutic exercise;Therapeutic activities;DME Instruction;Patient/family education;Manual techniques;Manual lymph drainage;Compression bandaging;Passive range of motion;Taping   PT Next Visit Plan assess for indep with self MLD, go over lymphedema risk reduction   PT Home Exercise Plan ankle pumps, active ankle circles   Consulted and Agree with Plan of Care Patient      Patient will benefit from skilled therapeutic intervention in order to improve the following deficits and impairments:  Increased edema, Postural dysfunction  Visit Diagnosis: Lymphedema, not elsewhere classified     Problem List Patient Active Problem List   Diagnosis Date Noted  . Dyspnea 01/27/2015  . Atypical chest pain 12/24/2014  . SOB (shortness of breath)   . Accelerating angina (HCC) 12/23/2014  . Chest pain at rest 10/30/2013  . Exertional dyspnea 10/30/2013  . Hyperlipidemia 10/30/2013  . Morbid obesity (HCC) 10/30/2013  . Acquired autoimmune hypothyroidism 09/02/2013  . Type II or unspecified type diabetes mellitus without mention of complication, uncontrolled 06/14/2013  . Essential hypertension, benign 06/14/2013  . Hyperlipemia 06/14/2013    [name] 10/31/2015,  11:52 AM  [facility] [address] [place], [place], [address] Phone: [phone]   Fax:  [phone]  Name: DYNASIA KERCHEVAL MRN: [id number] Date of Birth: 1940/09/28   Milagros Loll, PT 10/31/15 11:53 AM

## 2015-10-31 NOTE — Patient Instructions (Signed)
Deep Effective Breath   Standing, sitting, or laying down place both hands on the belly. Take a deep breath IN, expanding the belly; then breath OUT, contracting the belly. Repeat __5__ times. Do __2-3__ sessions per day and before each self massage.  http://gt2.exer.us/866   Copyright  VHI. All rights reserved.  Inguinal Nodes to Axilla - Clear   On involved side, at armpit, make _5__ in-place circles. Then from hip proceed in sections to armpit with stationary circles or pumps _5_ times, this is your pathway. Do _1__ time per day.  Copyright  VHI. All rights reserved.  LEG: Knee to Hip - Clear   Pump up outer thigh of involved leg from knee to outer hip. Then do stationary circles from inner to outer thigh, then do outer thigh again. Next, interlace fingers behind knee IF ABLE and make in-place circles. Do _5_ times of each sequence.  Do _1__ time per day.  Copyright  VHI. All rights reserved.  LEG: Ankle to Hip Sweep   Hands on sides of ankle of involved leg, pump _5__ times up both sides of lower leg, then retrace steps up outer thigh to hip as before and back to pathway. Do _2-3_ times. Do __1_ time per day.  Copyright  VHI. All rights reserved.  FOOT: Dorsum of Foot and Toes Massage   One hand on top of foot make _5_ stationary circles or pumps, then either on top of toes or each individual toe do _5_ pumps. Then retrace all steps pumping back up both sides of lower leg, outer thigh, and then pathway. Finish with what you started with, _5_ circles at involved side arm pit. All _2-3_ times at each sequence. Do _1__ time per day.  Copyright  VHI. All rights reserved.    

## 2015-11-03 ENCOUNTER — Ambulatory Visit: Payer: Medicare Other | Admitting: Physical Therapy

## 2015-11-05 ENCOUNTER — Ambulatory Visit (INDEPENDENT_AMBULATORY_CARE_PROVIDER_SITE_OTHER): Payer: Medicare Other | Admitting: Endocrinology

## 2015-11-05 ENCOUNTER — Encounter: Payer: Self-pay | Admitting: Endocrinology

## 2015-11-05 VITALS — BP 128/76 | HR 72 | Temp 97.9°F | Resp 16 | Ht 63.0 in | Wt 227.4 lb

## 2015-11-05 DIAGNOSIS — E1165 Type 2 diabetes mellitus with hyperglycemia: Secondary | ICD-10-CM

## 2015-11-05 DIAGNOSIS — E871 Hypo-osmolality and hyponatremia: Secondary | ICD-10-CM | POA: Diagnosis not present

## 2015-11-05 DIAGNOSIS — Z794 Long term (current) use of insulin: Secondary | ICD-10-CM | POA: Diagnosis not present

## 2015-11-05 DIAGNOSIS — R600 Localized edema: Secondary | ICD-10-CM

## 2015-11-05 MED ORDER — CANAGLIFLOZIN 100 MG PO TABS: ORAL_TABLET | ORAL | 3 refills | Status: DC

## 2015-11-05 NOTE — Progress Notes (Signed)
Patient ID: [name], female   DOB: 04-Sep-1940, 75 y.o.   MRN: [id number]    Reason for Appointment: Followup for Type 2 Diabetes    History of Present Illness:          Diagnosis: Type 2 diabetes mellitus, date of diagnosis:2011       Past history: Since she was apparently intolerant to metformin she was treated with Onglyza until about 2014  At that time because of increasing A1c of about 8% she was started on Levemir 15 units and this was aggressively increased Onglyza was continued She has not tried any other treatments for diabetes Her A1c has been 10-10.5 in 2015 In 3/15 she was told to start small doses of NovoLog at lunch and supper and add 15 units of Levemir at night also To control  hyperglycemia with her basal bolus insulin regimen she was given Victoza in addition on her initial consultation in 5/15 She has been on Victoza since 8/15   Recent history:   INSULIN regimen :  V-go pump 40 unit basal, boluses at meal times: 6-6-8 units  Noninsulin hypoglycemic drugs the patient is taking are:  VICTOZA 1.8 mg  She was changed on 09/19/14 to the V-go pump instead of basal bolus insulin shots With the V-go pump her blood sugars had improved However A1c now is significant higher at 8.1, previously 7.2  Current management, blood sugar patterns and problems:  Fasting blood sugars have not been checked at home, she thinks her lab glucose of 219 was fasting  She does not know why her blood sugars are overall higher, has had some problems with vomiting and diarrhea which is being evaluated  However she is checking blood sugars mostly after breakfast or before lunch and sometimes after supper only  Has lost weight from her having GI symptoms  She was started back on Victoza in July and is taking 1.8 mg, however she had not had any side effects from this previously  She usually not eating excessive amounts of carbohydrates or drinking sweet drinks Highest blood  sugar was 267 last night because she was told to stop her pump for 3 days because of her colonoscopy   Meals: 3 meals per day. Bfst oatmeal or egg or cereal at 8 am, dinner 5 pm.; no hs snacks   Side effects from medications have been: Metformin: rash  Glucose monitoring:  done 1 time a day         Glucometer:  One Touch.      Blood Glucose readings from home glucose meter download: Mean values apply above for all meters except median for One Touch  PRE-MEAL Fasting Lunch Dinner Bedtime Overall  Glucose range: 173       Mean/median:     178   POST-MEAL PC Breakfast PC Lunch PC Dinner  Glucose range: 141-218   121-267   Mean/median:      Glycemic control:    Lab Results  Component Value Date   HGBA1C 8.1 (H) 10/31/2015   HGBA1C 7.2 (H) 07/31/2015   HGBA1C 7.3 (H) 05/14/2015   Lab Results  Component Value Date   MICROALBUR 0.8 09/19/2014   LDLCALC 101 08/21/2014   CREATININE 0.97 10/31/2015    Self-care: The diet that the patient has been following is: tries to limit fats            Exercise: Walks 20 min, 5/7       Dietician visit: Most recent: 2014 (  class).    Low Carb Breakfast at about 9 am            Compliance with the medical regimen: Good   Weight history:  Wt Readings from Last 3 Encounters:  11/05/15 227 lb 6.4 oz (103.1 kg)  08/04/15 234 lb (106.1 kg)  08/04/15 233 lb 9.6 oz (106 kg)    Other active problems: See review of systems    Medication List       Accurate as of 11/05/15 12:58 PM. Always use your most recent med list.          aspirin 325 MG tablet Take 325 mg by mouth daily with breakfast.   betamethasone dipropionate 0.05 % cream Commonly known as:  DIPROLENE Apply 1 application topically 2 (two) times daily.   bisoprolol 10 MG tablet Commonly known as:  ZEBETA Take 20 mg by mouth daily.   canagliflozin 100 MG Tabs tablet Commonly known as:  INVOKANA 1 tablet before breakfast   carbamazepine 200 MG 12 hr capsule Commonly  known as:  CARBATROL Take 200 mg by mouth 2 (two) times daily.   clobetasol 0.05 % Gel Commonly known as:  TEMOVATE Apply 1 application topically 2 (two) times daily.   donepezil 10 MG tablet Commonly known as:  ARICEPT Take 10 mg by mouth daily with supper.   esomeprazole 40 MG capsule Commonly known as:  NEXIUM Take 40 mg by mouth daily with supper.   folic acid 1 MG tablet Commonly known as:  FOLVITE Take 1 mg by mouth daily.   gabapentin 300 MG capsule Commonly known as:  NEURONTIN Take 300 mg by mouth 2 (two) times daily.   glucose blood test strip Commonly known as:  ONE TOUCH ULTRA TEST Use as instructed to check blood sugar 5 times per day dx code E11.65   hydrochlorothiazide 25 MG tablet Commonly known as:  HYDRODIURIL Take 1 tablet (25 mg total) by mouth daily.   insulin lispro 100 UNIT/ML injection Commonly known as:  HUMALOG Inject 0.78 mLs (78 Units total) into the skin once. With V-go pump   Insulin Pen Needle 32G X 4 MM Misc Use 8 pen needles per day   levothyroxine 75 MCG tablet Commonly known as:  SYNTHROID, LEVOTHROID Take 75 mcg by mouth daily.   liraglutide 18 MG/3ML Sopn Commonly known as:  VICTOZA INJECT (1.8 MG) DAILY AS DIRECTED   methotrexate 2.5 MG tablet Commonly known as:  RHEUMATREX Take 7.5-10 mg by mouth 3 (three) times daily with meals.   methotrexate 2.5 MG tablet Commonly known as:  RHEUMATREX Take 2.5 mg by mouth once a week. Take 4 every Monday per patient   nitroGLYCERIN 0.4 MG SL tablet Commonly known as:  NITROSTAT Place 0.4 mg under the tongue every 5 (five) minutes as needed for chest pain.   OLANZapine 5 MG tablet Commonly known as:  ZYPREXA Take 5 mg by mouth at bedtime.   olmesartan 40 MG tablet Commonly known as:  BENICAR Take 1 tablet (40 mg total) by mouth daily.   ondansetron 8 MG tablet Commonly known as:  ZOFRAN Take 1 tablet (8 mg total) by mouth every 8 (eight) hours as needed for nausea.   ONE  TOUCH ULTRA 2 w/Device Kit Use to check blood sugar 5 times per day Dx code O35.00   [facility] DELICA LANCETS 93G Misc Use to check blood sugar 5 times per day dx code E11.65   solifenacin 5 MG tablet Commonly known as:  VESICARE Take 5 mg by mouth daily with breakfast.   spironolactone 50 MG tablet Commonly known as:  ALDACTONE Take 1 tablet (50 mg total) by mouth daily.   sucralfate 1 g tablet Commonly known as:  CARAFATE Take 1 g by mouth 4 (four) times daily -  with meals and at bedtime.   V-GO 40 Kit USE ONE PER DAY AS DIRECTED   Vitamin D 2000 units tablet Take 2,000 Units by mouth daily with breakfast.   ZENPEP 25000 units Cpep Generic drug:  Pancrelipase (Lip-Prot-Amyl) Take 25,000 tablets by mouth See admin instructions. Reported on 08/04/2015       Allergies:  Allergies  Allergen Reactions  . Flagyl [Metronidazole Hcl] Itching and Rash  . Ciprofloxacin Itching and Rash  . Metformin And Related Rash  . Milk-Related Compounds Other (See Comments)    Stomach pains  . Other Other (See Comments)    [place] nuts cause severe facial redness    Past Medical History:  Diagnosis Date  . Depression    Bipolar  . Diabetes mellitus    diet controlled  . GERD (gastroesophageal reflux disease)   . Hyperlipidemia   . Hypertension   . Ischemic colitis, enteritis, or enterocolitis ([place])   . Sleep apnea   . Stroke ([place])    x's 2  . Thyroid disease   . Urinary bladder incontinence   . Vertigo     Past Surgical History:  Procedure Laterality Date  . ABDOMINAL HYSTERECTOMY     partial   . APPENDECTOMY    . CARDIAC CATHETERIZATION N/A 12/24/2014   Procedure: Right/Left Heart Cath and Coronary Angiography;  Surgeon: Sanda Klein, MD;  Location: [facility];  Service: Cardiovascular;  Laterality: N/A;  . CHOLECYSTECTOMY    . FRACTURE SURGERY    . HEMORRHOID SURGERY    . RETINAL DETACHMENT SURGERY    . TONSILLECTOMY      Family History  Problem  Relation Age of Onset  . Heart disease Mother   . Stroke Father   . Cancer Sister   . Cancer Brother   . Parkinson's disease Father     Social History:  reports that she has never smoked. She has never used smokeless tobacco. She reports that she does not drink alcohol or use drugs.    Review of Systems   She has had less edema and takes Lasix regularly now  NEUROPATHY: She has lower leg pain and takes gabapentin twice a day for this, however does not have any symptoms      Lipids: Last LDL from PCP was 101, followed by PCP also        Lab Results  Component Value Date   CHOL 153 04/26/2014   HDL 55.60 04/26/2014   LDLCALC 101 08/21/2014   TRIG 113.0 04/26/2014   CHOLHDL 3 04/26/2014       Thyroid:   Has had presumed hypothyroidism for over 20 years, has not had a change in medications in several years and last TSH was 1.9 in August from PCP   Lab Results  Component Value Date   TSH 2.39 01/27/2015   TSH 1.90 08/21/2014   TSH 1.65 01/31/2014   FREET4 0.86 01/31/2014   FREET4 0.91 08/28/2013       The blood pressure has been controlled with a combination of amlodipine and Benicar as well as hydralazine and bisoprolol  Blood pressure is ok at home     Physical Examination:  BP 128/76  Pulse 72   Temp 97.9 F (36.6 C)   Resp 16   Ht 5' 3"  (1.6 m)   Wt 227 lb 6.4 oz (103.1 kg)   SpO2 96%   BMI 40.28 kg/m     1+  edema present. She does not look pale  ASSESSMENT/PLAN:   Diabetes type 2, uncontrolled   See history of present illness for detailed discussion of his current management, blood sugar patterns and problems identified Her blood sugars are excellent with the V-go pump and this has improved her compliance with mealtime insulin. Also has less variability with this A1c has come down further and is upper normal now at 6.2  She misunderstood and stopped checking her blood sugars nonfasting, discussed need for checking postprandial readings to help  adjust her boluses and monitor effects of various foods. Discussed blood sugar targets May adjust her boluses by one click based on what she is eating  She can reduce frequency of monitoring in the morning Her monitor was reset to  the correct daytime time She can switch Novolog to Humalog since her insurance will not cover Novolog now, should not make any difference in her level of control  Follow-up in 3 months again  NEUROPATHY: Symptomatically better and she can take gabapentin as needed only  HYPOTHYROIDISM: Her TSH is normal with 75 g and she will continue the same dose  Patient Instructions  Stop HCTZ and Victoza  Invokana in am   Check blood sugars on waking up 4x weekly  Also check blood sugars about 2 hours after a meal and do this after different meals by rotation  Recommended blood sugar levels on waking up is 90-130 and about 2 hours after meal is 130-160  Please bring your blood sugar monitor to each visit, thank you  Avoid bananas and cantoulopes       Counseling time on subjects discussed above is over 50% of today's 25 minute visit   Mayrani Khamis 11/05/2015, 12:58 PM   Note: This office note was prepared with Estate agent. Any transcriptional errors that result from this process are unintentional.                     Patient ID: [name], female   DOB: 1940/02/09, 75 y.o.   MRN: [id number]    Reason for Appointment: Followup for Type 2 Diabetes and swelling of legs  History of Present Illness:          Diagnosis: Type 2 diabetes mellitus, date of diagnosis:2011       Past history: Since she was apparently intolerant to metformin she was treated with Onglyza until about 2014  At that time because of increasing A1c of about 8% she was started on Levemir 15 units and this was aggressively increased Onglyza was continued She has not tried any other treatments for diabetes Her A1c has been 10-10.5 in  2015 In 3/15 she was told to start small doses of NovoLog at lunch and supper and add 15 units of Levemir at night also To control  hyperglycemia with her basal bolus insulin regimen she was given Victoza in addition on her initial consultation in 5/15  Recent history:   INSULIN regimen :  V-go pump 40 unit basal, boluses at meal times: 6-6-8 units  She was changed on 09/19/14 to the V-go pump instead of basal bolus insulin shots She has been on Victoza since 8/15 and she is taking 1.2 mg With the  V-go pump her blood sugars have improved overall and are less variable  Current blood sugar patterns and problems:  Fasting blood sugars are very consistent, usually just above 100  She has stopped checking her blood sugars later in the day even though she was told to vary the times when she checks her sugar  She has no difficulty remembering to do her boluses with meals but usually does not change the amounts  She has improved her diet and is trying to  eliminate high glycemic index foods and juices  Recently trying to walk more regularly also  No hypoglycemia  She has no difficulty with using the V-go pump, doing the boluses when she is eating out and changing it every morning    Oral hypoglycemic drugs the patient is taking are: None    Side effects from medications have been: Metformin: rash  Glucose monitoring:  done 1 time a day         Glucometer:  One Touch.      Blood Glucose readings from home glucose meter download:  FASTING readings recently: 90-147, usually under 130.  Her monitor has incorrect date and time programmed   Glycemic control:   A1c was 7  from PCP in 8/16  Lab Results  Component Value Date   HGBA1C 8.1 (H) 10/31/2015   HGBA1C 7.2 (H) 07/31/2015   HGBA1C 7.3 (H) 05/14/2015   Lab Results  Component Value Date   MICROALBUR 0.8 09/19/2014   LDLCALC 101 08/21/2014   CREATININE 0.97 10/31/2015    Self-care: The diet that the patient has been following  is: tries to limit fats     Meals: 3 meals per day. Bfst oatmeal or egg/toast at 8-9 am, dinner 5 pm cutting back on frequent meals and  portions; no hs snacks           Exercise: Walks 15-20 min       Dietician visit: Most recent: 2014 ( class).               Compliance with the medical regimen: Good  Retinal exam: Most recent:.9/14    Weight history:  Wt Readings from Last 3 Encounters:  11/05/15 227 lb 6.4 oz (103.1 kg)  08/04/15 234 lb (106.1 kg)  08/04/15 233 lb 9.6 oz (106 kg)      Medication List       Accurate as of 11/05/15 12:58 PM. Always use your most recent med list.          aspirin 325 MG tablet Take 325 mg by mouth daily with breakfast.   betamethasone dipropionate 0.05 % cream Commonly known as:  DIPROLENE Apply 1 application topically 2 (two) times daily.   bisoprolol 10 MG tablet Commonly known as:  ZEBETA Take 20 mg by mouth daily.   canagliflozin 100 MG Tabs tablet Commonly known as:  INVOKANA 1 tablet before breakfast   carbamazepine 200 MG 12 hr capsule Commonly known as:  CARBATROL Take 200 mg by mouth 2 (two) times daily.   clobetasol 0.05 % Gel Commonly known as:  TEMOVATE Apply 1 application topically 2 (two) times daily.   donepezil 10 MG tablet Commonly known as:  ARICEPT Take 10 mg by mouth daily with supper.   esomeprazole 40 MG capsule Commonly known as:  NEXIUM Take 40 mg by mouth daily with supper.   folic acid 1 MG tablet Commonly known as:  FOLVITE Take 1 mg by mouth daily.   gabapentin 300 MG capsule  Commonly known as:  NEURONTIN Take 300 mg by mouth 2 (two) times daily.   glucose blood test strip Commonly known as:  ONE TOUCH ULTRA TEST Use as instructed to check blood sugar 5 times per day dx code E11.65   hydrochlorothiazide 25 MG tablet Commonly known as:  HYDRODIURIL Take 1 tablet (25 mg total) by mouth daily.   insulin lispro 100 UNIT/ML injection Commonly known as:  HUMALOG Inject 0.78 mLs (78 Units  total) into the skin once. With V-go pump   Insulin Pen Needle 32G X 4 MM Misc Use 8 pen needles per day   levothyroxine 75 MCG tablet Commonly known as:  SYNTHROID, LEVOTHROID Take 75 mcg by mouth daily.   liraglutide 18 MG/3ML Sopn Commonly known as:  VICTOZA INJECT (1.8 MG) DAILY AS DIRECTED   methotrexate 2.5 MG tablet Commonly known as:  RHEUMATREX Take 7.5-10 mg by mouth 3 (three) times daily with meals.   methotrexate 2.5 MG tablet Commonly known as:  RHEUMATREX Take 2.5 mg by mouth once a week. Take 4 every Monday per patient   nitroGLYCERIN 0.4 MG SL tablet Commonly known as:  NITROSTAT Place 0.4 mg under the tongue every 5 (five) minutes as needed for chest pain.   OLANZapine 5 MG tablet Commonly known as:  ZYPREXA Take 5 mg by mouth at bedtime.   olmesartan 40 MG tablet Commonly known as:  BENICAR Take 1 tablet (40 mg total) by mouth daily.   ondansetron 8 MG tablet Commonly known as:  ZOFRAN Take 1 tablet (8 mg total) by mouth every 8 (eight) hours as needed for nausea.   ONE TOUCH ULTRA 2 w/Device Kit Use to check blood sugar 5 times per day Dx code K81.27   [facility] DELICA LANCETS 51Z Misc Use to check blood sugar 5 times per day dx code E11.65   solifenacin 5 MG tablet Commonly known as:  VESICARE Take 5 mg by mouth daily with breakfast.   spironolactone 50 MG tablet Commonly known as:  ALDACTONE Take 1 tablet (50 mg total) by mouth daily.   sucralfate 1 g tablet Commonly known as:  CARAFATE Take 1 g by mouth 4 (four) times daily -  with meals and at bedtime.   V-GO 40 Kit USE ONE PER DAY AS DIRECTED   Vitamin D 2000 units tablet Take 2,000 Units by mouth daily with breakfast.   ZENPEP 25000 units Cpep Generic drug:  Pancrelipase (Lip-Prot-Amyl) Take 25,000 tablets by mouth See admin instructions. Reported on 08/04/2015       Allergies:  Allergies  Allergen Reactions  . Flagyl [Metronidazole Hcl] Itching and Rash  . Ciprofloxacin  Itching and Rash  . Metformin And Related Rash  . Milk-Related Compounds Other (See Comments)    Stomach pains  . Other Other (See Comments)    [place] nuts cause severe facial redness    Past Medical History:  Diagnosis Date  . Depression    Bipolar  . Diabetes mellitus    diet controlled  . GERD (gastroesophageal reflux disease)   . Hyperlipidemia   . Hypertension   . Ischemic colitis, enteritis, or enterocolitis ([place])   . Sleep apnea   . Stroke ([place])    x's 2  . Thyroid disease   . Urinary bladder incontinence   . Vertigo     Past Surgical History:  Procedure Laterality Date  . ABDOMINAL HYSTERECTOMY     partial   . APPENDECTOMY    . CARDIAC CATHETERIZATION N/A 12/24/2014  Procedure: Right/Left Heart Cath and Coronary Angiography;  Surgeon: Sanda Klein, MD;  Location: [facility];  Service: Cardiovascular;  Laterality: N/A;  . CHOLECYSTECTOMY    . FRACTURE SURGERY    . HEMORRHOID SURGERY    . RETINAL DETACHMENT SURGERY    . TONSILLECTOMY      Family History  Problem Relation Age of Onset  . Heart disease Mother   . Stroke Father   . Cancer Sister   . Cancer Brother   . Parkinson's disease Father     Social History:  reports that she has never smoked. She has never used smokeless tobacco. She reports that she does not drink alcohol or use drugs.    Review of Systems    HYPONATREMIA:  She does have a Significantly low sodium of 129, previously 137, she thinks her PCP has found this to be borderline low couple of times She continues to take 25 mg of HCTZ   Lab Results  Component Value Date   CREATININE 0.97 10/31/2015   BUN 13 10/31/2015   NA 129 (L) 10/31/2015   K 4.4 10/31/2015   CL 94 (L) 10/31/2015   CO2 27 10/31/2015     NEUROPATHY: She has lower leg pain and takes gabapentin twice a day for this with relief      Lipids: Last LDL from PCP was 92, followed by PCP also        Lab Results  Component Value Date   CHOL 153  04/26/2014   HDL 55.60 04/26/2014   LDLCALC 101 08/21/2014   TRIG 113.0 04/26/2014   CHOLHDL 3 04/26/2014       Thyroid:    Has had presumed hypothyroidism for over 20 years, has not had a change in medications in several years and last TSH was 3.7 in August   Lab Results  Component Value Date   TSH 2.39 01/27/2015   TSH 1.90 08/21/2014   TSH 1.65 01/31/2014   FREET4 0.86 01/31/2014   FREET4 0.91 08/28/2013       The blood pressure has been controlled with a combination of Bisoprolol and Benicar, HCT as well as Aldactone.  Does not appear to be taking her calcium blocker now  She is being treated for lymphedema and is wearing support stockings   Physical Examination:  BP 128/76   Pulse 72   Temp 97.9 F (36.6 C)   Resp 16   Ht 5' 3"  (1.6 m)   Wt 227 lb 6.4 oz (103.1 kg)   SpO2 96%   BMI 40.28 kg/m   Lower legs are appearing  edematous  ASSESSMENT/PLAN:   Diabetes type 2, uncontrolled   See history of present illness for detailed discussion of current  management, blood sugar patterns and problems identified.  Her blood sugars are more difficult to control now and not clear why This is despite restarting Victoza on the last visit. Also has lost weight. Diet does not appear to be a factor Her fasting blood sugars are appearing to be high and she is on the maximum basal with the V-go pump, postprandial readings are not consistently high   Recommended the following:  Continue the V-go pump with 40 unit basal  She will stop the 1.8 mg Victoza in case this is causing some of her nausea Trial of Invokana 100 mg daily as it should be helpful in improving her control and has no contraindications. Discussed action of SGLT 2 drugs on lowering glucose by decreasing kidney  absorption of glucose, benefits of weight loss and lower blood pressure, possible side effects including candidiasis and dosage regimen   Stop HCTZ because of hyponatremia and starting  Invokana.  Avoid high potassium foods  She will try to rotate her blood sugar testing times and include some fasting readings and readings after lunch  Follow-up in 3 weeks  Check blood pressure periodically at home   Patient Instructions  Stop HCTZ and Victoza  Invokana in am   Check blood sugars on waking up 4x weekly  Also check blood sugars about 2 hours after a meal and do this after different meals by rotation  Recommended blood sugar levels on waking up is 90-130 and about 2 hours after meal is 130-160  Please bring your blood sugar monitor to each visit, thank you  Avoid bananas and cantoulopes        Counseling time on subjects discussed above is over 50% of today's 25 minute visit   [name] 11/05/2015, 12:58 PM   Note: This office note was prepared with Estate agent. Any transcriptional errors that result from this process are unintentional.

## 2015-11-05 NOTE — Patient Instructions (Addendum)
Stop HCTZ and Victoza  Invokana in am   Check blood sugars on waking up 4x weekly  Also check blood sugars about 2 hours after a meal and do this after different meals by rotation  Recommended blood sugar levels on waking up is 90-130 and about 2 hours after meal is 130-160  Please bring your blood sugar monitor to each visit, thank you  Avoid bananas and cantoulopes

## 2015-11-06 ENCOUNTER — Ambulatory Visit: Payer: Medicare Other | Admitting: Physical Therapy

## 2015-11-06 ENCOUNTER — Encounter: Payer: Self-pay | Admitting: Physical Therapy

## 2015-11-06 DIAGNOSIS — I890 Lymphedema, not elsewhere classified: Secondary | ICD-10-CM

## 2015-11-06 NOTE — Therapy (Signed)
[facility] [address] [place], [place], [address] Phone: [phone]   Fax:  [phone]  Physical Therapy Treatment  Patient Details  Name: [name] MRN: [id number] Date of Birth: Aug 27, 1940 Referring Provider: Charna ElizabethLaura Ann Previll, MD  Encounter Date: 11/06/2015      PT End of Session - 11/06/15 1434    Visit Number 6   Number of Visits 9   Date for PT Re-Evaluation 11/14/15   PT Start Time 1350   PT Stop Time 1433   PT Time Calculation (min) 43 min   Activity Tolerance Patient tolerated treatment well   Behavior During Therapy [facility] for tasks assessed/performed      Past Medical History:  Diagnosis Date  . Depression    Bipolar  . Diabetes mellitus    diet controlled  . GERD (gastroesophageal reflux disease)   . Hyperlipidemia   . Hypertension   . Ischemic colitis, enteritis, or enterocolitis (HCC)   . Sleep apnea   . Stroke (HCC)    x's 2  . Thyroid disease   . Urinary bladder incontinence   . Vertigo     Past Surgical History:  Procedure Laterality Date  . ABDOMINAL HYSTERECTOMY     partial   . APPENDECTOMY    . CARDIAC CATHETERIZATION N/A 12/24/2014   Procedure: Right/Left Heart Cath and Coronary Angiography;  Surgeon: Thurmon FairMihai Croitoru, MD;  [facility];  Service: Cardiovascular;  Laterality: N/A;  . CHOLECYSTECTOMY    . FRACTURE SURGERY    . HEMORRHOID SURGERY    . RETINAL DETACHMENT SURGERY    . TONSILLECTOMY      There were no vitals filed for this visit.      Subjective Assessment - 11/06/15 1353    Subjective I did not do a good job with the self drainage because I have been sick for several days. I was sick from a colonoscopy and before that I had nausea, vomiting and diarrhea.    Pertinent History Patient has a history of gallbladder surgery and abdominal hysterectomy, diet-controlled diabetes, ischemic colitis, and 2 previous strokes.   Patient Stated Goals to  know that the swelling is under control and what I can do to keep it that way   Currently in Pain? No/denies                         [facility]/Exercise - 11/06/15 0001      Manual Therapy   Manual Lymphatic Drainage (MLD) In Supine: Short neck, 5 diaphragmatic breaths, Rt axillary nodes, Rt inguino-axillary anastomosis, and Rt LE from thigh to dorsum of foot working proximal to distal then retracing all steps while instructing pt throughout - repeated on L side                PT Education - 11/06/15 1433    Education provided Yes   Education Details educated patient about lymphedema risk reduction practices   Person(s) Educated Patient   Methods Explanation;Handout   Comprehension Verbalized understanding                Long Term Clinic Goals - 11/06/15 1434      CC Long Term Goal  #1   Title Patient will be knowledgeable in lymphedema risk reduction practices.   Baseline 11/06/15 - pt educated today   Time 4   Period Weeks   Status On-going     CC Long Term  Goal  #2   Title Patient will obtain compression garments and be independent in donning/doffing them.   Status Achieved     CC Long Term Goal  #3   Title Patient will be knowledgeable in conservative management of lymphedema.   Baseline 10/31/15- instructed patient in self MLD technique today and had patient return demonstrate - issued handout, 11/06/15- pt has not practiced technique because she was sick   Time 4   Period Weeks   Status On-going            Plan - 11/06/15 1435    Clinical Impression Statement Patient continues to be able to don her shoes. She was very sick since her last visit with nausea, vomiting and diarrhea. She also had a colonoscopy and therefore did not practice self manual lymphatic drainage. She plans on practicing before next session. Patient was issued handout on lymphedema risk reduction practices.    Rehab Potential Good   Clinical  Impairments Affecting Rehab Potential none   PT Frequency 2x / week   PT Duration 4 weeks   PT Treatment/Interventions ADLs/Self Care Home Management;Therapeutic exercise;Therapeutic activities;DME Instruction;Patient/family education;Manual techniques;Manual lymph drainage;Compression bandaging;Passive range of motion;Taping   PT Next Visit Plan assess for indep with self MLD, assess for indep with verbalizing lymphedema risk reduction   PT Home Exercise Plan ankle pumps, active ankle circles   Consulted and Agree with Plan of Care Patient      Patient will benefit from skilled therapeutic intervention in order to improve the following deficits and impairments:  Increased edema, Postural dysfunction  Visit Diagnosis: Lymphedema, not elsewhere classified     Problem List Patient Active Problem List   Diagnosis Date Noted  . Dyspnea 01/27/2015  . Atypical chest pain 12/24/2014  . SOB (shortness of breath)   . Accelerating angina (HCC) 12/23/2014  . Chest pain at rest 10/30/2013  . Exertional dyspnea 10/30/2013  . Hyperlipidemia 10/30/2013  . Morbid obesity (HCC) 10/30/2013  . Acquired autoimmune hypothyroidism 09/02/2013  . Type II or unspecified type diabetes mellitus without mention of complication, uncontrolled 06/14/2013  . Essential hypertension, benign 06/14/2013  . Hyperlipemia 06/14/2013    [name] 11/06/2015, 2:37 PM  [facility] [address] [place], [place], [address] Phone: [phone]   Fax:  [phone]  Name: [name] MRN: [id number] Date of Birth: Nov 09, 1940   Milagros Loll, PT 11/06/15 2:38 PM

## 2015-11-11 ENCOUNTER — Ambulatory Visit: Payer: Medicare Other

## 2015-11-11 DIAGNOSIS — I890 Lymphedema, not elsewhere classified: Secondary | ICD-10-CM

## 2015-11-11 NOTE — Therapy (Signed)
[facility] [address] [place], [place], [address] Phone: [phone]   Fax:  [phone]  Physical Therapy Treatment  Patient Details  Name: [name] MRN: [id number] Date of Birth: 1940/08/26 Referring Provider: Charna ElizabethLaura Ann Previll, MD  Encounter Date: 11/11/2015      PT End of Session - 11/11/15 1107    Visit Number 7   Number of Visits 9   Date for PT Re-Evaluation 11/14/15   PT Start Time 1022   PT Stop Time 1107   PT Time Calculation (min) 45 min   Activity Tolerance Patient tolerated treatment well   Behavior During Therapy [facility] for tasks assessed/performed      Past Medical History:  Diagnosis Date  . Depression    Bipolar  . Diabetes mellitus    diet controlled  . GERD (gastroesophageal reflux disease)   . Hyperlipidemia   . Hypertension   . Ischemic colitis, enteritis, or enterocolitis (HCC)   . Sleep apnea   . Stroke (HCC)    x's 2  . Thyroid disease   . Urinary bladder incontinence   . Vertigo     Past Surgical History:  Procedure Laterality Date  . ABDOMINAL HYSTERECTOMY     partial   . APPENDECTOMY    . CARDIAC CATHETERIZATION N/A 12/24/2014   Procedure: Right/Left Heart Cath and Coronary Angiography;  Surgeon: Thurmon FairMihai Croitoru, MD;  [facility];  Service: Cardiovascular;  Laterality: N/A;  . CHOLECYSTECTOMY    . FRACTURE SURGERY    . HEMORRHOID SURGERY    . RETINAL DETACHMENT SURGERY    . TONSILLECTOMY      There were no vitals filed for this visit.      Subjective Assessment - 11/11/15 1027    Subjective I'm still feeling better since I was sick. I haven't gotten the pathololgy reports yet from the 6 biopsy he took during the colonscopy. I've been noticing my compression stockings have been easier to get on and off this week, so I'm think I'm doing good. But I am nervous about being done this week.   Pertinent History Patient has a history of gallbladder  surgery and abdominal hysterectomy, diet-controlled diabetes, ischemic colitis, and 2 previous strokes.   Patient Stated Goals to know that the swelling is under control and what I can do to keep it that way   Currently in Pain? No/denies                         [facility]/Ex[facility] - 11/11/15 0001      Manual Therapy   Manual Lymphatic Drainage (MLD) In Supine: Short neck, 5 diaphragmatic breaths, Rt axillary nodes, Rt inguino-axillary anastomosis, and Rt LE from thigh to dorsum of foot working proximal to distal then retracing all steps while reviewing with pt throughout - repeated on L side                        Long Term Clinic Goals - 11/06/15 1434      CC Long Term Goal  #1   Title Patient will be knowledgeable in lymphedema risk reduction practices.   Baseline 11/06/15 - pt educated today   Time 4   Period Weeks   Status On-going     CC Long Term Goal  #2   Title Patient will obtain compression garments and be independent in donning/doffing them.   Status Achieved  CC Long Term Goal  #3   Title Patient will be knowledgeable in conservative management of lymphedema.   Baseline 10/31/15- instructed patient in self MLD technique today and had patient return demonstrate - issued handout, 11/06/15- pt has not practiced technique because she was sick   Time 4   Period Weeks   Status On-going            Plan - 11/11/15 1107    Clinical Impression Statement Pt reports her compression stockings have been slightly easier to donn and doff over past week so she thinks her legs have been reducing. She hsa one moe appoinment this week but is nervous to completely D/C so we discussed this and agreed pt will make one additional appt in 2-3 weeks so we can reassess her measurements after she has had a few weeks on her own.   Rehab Potential Good   Clinical Impairments Affecting Rehab Potential none   PT Frequency 2x / week   PT  Duration 4 weeks   PT Treatment/Interventions ADLs/Self Care Home Management;Therapeutic exercise;Therapeutic activities;DME Instruction;Patient/family education;Manual techniques;Manual lymph drainage;Compression bandaging;Passive range of motion;Taping   PT Next Visit Plan Remeasure circumference and assess for indep with verbalizing lymphedema risk reduction   Consulted and Agree with Plan of Care Patient      Patient will benefit from skilled therapeutic intervention in order to improve the following deficits and impairments:  Increased edema, Postural dysfunction  Visit Diagnosis: Lymphedema, not elsewhere classified     Problem List Patient Active Problem List   Diagnosis Date Noted  . Dyspnea 01/27/2015  . Atypical chest pain 12/24/2014  . SOB (shortness of breath)   . Accelerating angina (HCC) 12/23/2014  . Chest pain at rest 10/30/2013  . Exertional dyspnea 10/30/2013  . Hyperlipidemia 10/30/2013  . Morbid obesity (HCC) 10/30/2013  . Acquired autoimmune hypothyroidism 09/02/2013  . Type II or unspecified type diabetes mellitus without mention of complication, uncontrolled 06/14/2013  . Essential hypertension, benign 06/14/2013  . Hyperlipemia 06/14/2013    Hermenia Bers, PTA 11/11/2015, 11:10 AM  [facility] [address] [place], [place], [address] Phone: [phone]   Fax:  [phone]  Name: [name] MRN: [id number] Date of Birth: [birth year]

## 2015-11-13 ENCOUNTER — Ambulatory Visit: Payer: Medicare Other | Admitting: Physical Therapy

## 2015-11-13 ENCOUNTER — Encounter: Payer: Self-pay | Admitting: Physical Therapy

## 2015-11-13 DIAGNOSIS — I890 Lymphedema, not elsewhere classified: Secondary | ICD-10-CM | POA: Diagnosis not present

## 2015-11-13 NOTE — Therapy (Signed)
[facility] [address] [place], [place], [address] Phone: [phone]   Fax:  [phone]  Physical Therapy Treatment  Patient Details  Name: [name] MRN: [id number] Date of Birth: May 07, 1940 Referring Provider: Charna Elizabeth, MD  Encounter Date: 11/13/2015      PT End of Session - 11/13/15 1700    Visit Number 8   Number of Visits 11   Date for PT Re-Evaluation 12/11/15   PT Start Time 1300   PT Stop Time 1348   PT Time Calculation (min) 48 min   Activity Tolerance Patient tolerated treatment well   Behavior During Therapy [facility] for tasks assessed/performed      Past Medical History:  Diagnosis Date  . Depression    Bipolar  . Diabetes mellitus    diet controlled  . GERD (gastroesophageal reflux disease)   . Hyperlipidemia   . Hypertension   . Ischemic colitis, enteritis, or enterocolitis (HCC)   . Sleep apnea   . Stroke (HCC)    x's 2  . Thyroid disease   . Urinary bladder incontinence   . Vertigo     Past Surgical History:  Procedure Laterality Date  . ABDOMINAL HYSTERECTOMY     partial   . APPENDECTOMY    . CARDIAC CATHETERIZATION N/A 12/24/2014   Procedure: Right/Left Heart Cath and Coronary Angiography;  Surgeon: Thurmon Fair, MD;  [facility];  Service: Cardiovascular;  Laterality: N/A;  . CHOLECYSTECTOMY    . FRACTURE SURGERY    . HEMORRHOID SURGERY    . RETINAL DETACHMENT SURGERY    . TONSILLECTOMY      There were no vitals filed for this visit.      Subjective Assessment - 11/13/15 1301    Subjective The pathology results all came back fine. I still have loose bowels and they gave me a medication for that. The massage is going okay. I know how to do it but it is hard to get down to my feet.    Pertinent History Patient has a history of gallbladder surgery and abdominal hysterectomy, diet-controlled diabetes, ischemic colitis, and 2 previous strokes.   Patient Stated Goals to know that the swelling is under control and what I can do to keep it that way   Currently in Pain? No/denies               LYMPHEDEMA/ONCOLOGY QUESTIONNAIRE - 11/13/15 1308      Right Lower Extremity Lymphedema   At Midpatella/Popliteal Crease 39.5 cm   30 cm Proximal to Floor at Lateral Plantar Foot 43 cm   20 cm Proximal to Floor at Lateral Plantar Foot 33.5 1   10  cm Proximal to Floor at Lateral Malleoli 25 cm   5 cm Proximal to 1st MTP Joint 22.9 cm   Across MTP Joint 21.6 cm   Around Proximal Great Toe 7.6 cm     Left Lower Extremity Lymphedema   At Midpatella/Popliteal Crease 42.5 cm   30 cm Proximal to Floor at Lateral Plantar Foot 42.5 cm   20 cm Proximal to Floor at Lateral Plantar Foot 32.5 cm   10 cm Proximal to Floor at Lateral Malleoli 25 cm   5 cm Proximal to 1st MTP Joint 23.8 cm   Across MTP Joint 22 cm   Around Proximal Great Toe 8 cm                  [facility] Adult PT Treatment/Exercise -  11/13/15 0001      Manual Therapy   Manual Lymphatic Drainage (MLD) In Supine: Short neck, 5 diaphragmatic breaths, Rt axillary nodes, Rt inguino-axillary anastomosis, and Rt LE from thigh to dorsum of foot working proximal to distal then retracing all steps, repeated on L side                        Long Term Clinic Goals - 11/13/15 1305      CC Long Term Goal  #1   Title Patient will be knowledgeable in lymphedema risk reduction practices.   Baseline 11/06/15 - pt educated today, 11/13/15- pt had difficulty recalling these today   Time 4   Period Weeks   Status On-going     CC Long Term Goal  #2   Title Patient will obtain compression garments and be independent in donning/doffing them.   Status Achieved     CC Long Term Goal  #3   Title Patient will be knowledgeable in conservative management of lymphedema.   Baseline 10/31/15- instructed patient in self MLD technique today and had patient return demonstrate -  issued handout, 11/06/15- pt has not practiced technique because she was sick, 11/13/15- Pt feels more comfortable with self drainage technique but would like to come back in two weeks to assess measurements to see if she is able to control it independently   Time 4   Period Weeks   Status On-going            Plan - 11/13/15 1701    Clinical Impression Statement Patient reports she has some difficulty with self manual lymphatic drainage because it is hard for her to reach her feet. She has been attempting and has been compliant with wearing her compression knee high stockings. Will recert pt this visit and let patient manage her edema independently for the next two weeks and then reassess her measurements and see if she is ready for discharge from skilled PT services.    Rehab Potential Good   Clinical Impairments Affecting Rehab Potential none   PT Frequency Biweekly   PT Duration 4 weeks   PT Treatment/Interventions ADLs/Self Care Home Management;Therapeutic exercise;Therapeutic activities;DME Instruction;Patient/family education;Manual techniques;Manual lymph drainage;Compression bandaging;Passive range of motion;Taping   PT Next Visit Plan Remeasure circumference and assess for indep with verbalizing lymphedema risk reduction, d/c is pt is able to manage indep otherwise see once more in another 2 weeks   PT Home Exercise Plan ankle pumps, active ankle circles   Consulted and Agree with Plan of Care Patient      Patient will benefit from skilled therapeutic intervention in order to improve the following deficits and impairments:  Increased edema, Postural dysfunction  Visit Diagnosis: Lymphedema, not elsewhere classified - Plan: PT plan of care cert/re-cert     Problem List Patient Active Problem List   Diagnosis Date Noted  . Dyspnea 01/27/2015  . Atypical chest pain 12/24/2014  . SOB (shortness of breath)   . Accelerating angina (HCC) 12/23/2014  . Chest pain at rest  10/30/2013  . Exertional dyspnea 10/30/2013  . Hyperlipidemia 10/30/2013  . Morbid obesity (HCC) 10/30/2013  . Acquired autoimmune hypothyroidism 09/02/2013  . Type II or unspecified type diabetes mellitus without mention of complication, uncontrolled 06/14/2013  . Essential hypertension, benign 06/14/2013  . Hyperlipemia 06/14/2013    Redge GainerBlaire L Breedlove 11/13/2015, 5:07 PM  [facility] [address] [place], [place], [address] Phone: [phone]  Fax:  [phone]  Name: TAELAR GRONEWOLD MRN: [id number] Date of Birth: 1940-11-04  Milagros Loll, PT 11/13/15 5:07 PM

## 2015-11-21 ENCOUNTER — Other Ambulatory Visit (INDEPENDENT_AMBULATORY_CARE_PROVIDER_SITE_OTHER): Payer: Medicare Other

## 2015-11-21 DIAGNOSIS — E1165 Type 2 diabetes mellitus with hyperglycemia: Secondary | ICD-10-CM

## 2015-11-21 DIAGNOSIS — Z794 Long term (current) use of insulin: Secondary | ICD-10-CM | POA: Diagnosis not present

## 2015-11-21 LAB — BASIC METABOLIC PANEL
BUN: 16 mg/dL (ref 6–23)
CALCIUM: 9.3 mg/dL (ref 8.4–10.5)
CO2: 28 meq/L (ref 19–32)
CREATININE: 0.97 mg/dL (ref 0.40–1.20)
Chloride: 103 mEq/L (ref 96–112)
GFR: 59.58 mL/min — ABNORMAL LOW (ref >60.00)
GLUCOSE: 132 mg/dL — AB (ref 70–99)
Potassium: 4.8 mEq/L (ref 3.5–5.1)
Sodium: 138 mEq/L (ref 135–145)

## 2015-11-22 LAB — FRUCTOSAMINE: Fructosamine: 241 umol/L (ref 0–285)

## 2015-11-25 ENCOUNTER — Other Ambulatory Visit: Payer: Self-pay

## 2015-11-25 ENCOUNTER — Ambulatory Visit (INDEPENDENT_AMBULATORY_CARE_PROVIDER_SITE_OTHER): Payer: Medicare Other | Admitting: Endocrinology

## 2015-11-25 ENCOUNTER — Other Ambulatory Visit: Payer: Self-pay | Admitting: Endocrinology

## 2015-11-25 ENCOUNTER — Encounter: Payer: Self-pay | Admitting: Endocrinology

## 2015-11-25 VITALS — BP 130/86 | HR 64 | Ht 63.0 in | Wt 228.0 lb

## 2015-11-25 DIAGNOSIS — I10 Essential (primary) hypertension: Secondary | ICD-10-CM

## 2015-11-25 DIAGNOSIS — E038 Other specified hypothyroidism: Secondary | ICD-10-CM

## 2015-11-25 DIAGNOSIS — Z794 Long term (current) use of insulin: Secondary | ICD-10-CM

## 2015-11-25 DIAGNOSIS — E871 Hypo-osmolality and hyponatremia: Secondary | ICD-10-CM

## 2015-11-25 DIAGNOSIS — E1165 Type 2 diabetes mellitus with hyperglycemia: Secondary | ICD-10-CM | POA: Diagnosis not present

## 2015-11-25 DIAGNOSIS — E063 Autoimmune thyroiditis: Secondary | ICD-10-CM

## 2015-11-25 MED ORDER — GLUCOSE BLOOD VI STRP: ORAL_STRIP | 12 refills | Status: DC

## 2015-11-25 MED ORDER — CANAGLIFLOZIN 300 MG PO TABS: 300.0000 mg | ORAL_TABLET | Freq: Every day | ORAL | 2 refills | Status: DC

## 2015-11-25 NOTE — Progress Notes (Signed)
Patient ID: [name], female   DOB: August 21, 1940, 75 y.o.   MRN: [id number]    Reason for Appointment: Followup for Type 2 Diabetes    History of Present Illness:          Diagnosis: Type 2 diabetes mellitus, date of diagnosis:2011       Past history: Since she was apparently intolerant to metformin she was treated with Onglyza until about 2014  At that time because of increasing A1c of about 8% she was started on Levemir 15 units and this was aggressively increased Onglyza was continued She has not tried any other treatments for diabetes Her A1c has been 10-10.5 in 2015 In 3/15 she was told to start small doses of NovoLog at lunch and supper and add 15 units of Levemir at night also To control  hyperglycemia with her basal bolus insulin regimen she was given Victoza in addition on her initial consultation in 5/15 She has been on Victoza since 8/15   Recent history:   INSULIN regimen :  V-go pump 40 unit basal, boluses at meal times: 12-30-14 units  Noninsulin hypoglycemic drugs the patient is taking are: Invokana 100 mg, was on VICTOZA 1.8 mg  She was changed on 09/19/14 to the V-go pump instead of basal bolus insulin shots With the V-go pump her blood sugars had improved However A1c recently is significant higher at 8.1, previously 7.2  Current management, blood sugar patterns and problems:  She was taken off Victoza because of persistent nausea  Has started taking Invokana 100 mg daily without side effects  However her blood sugars overall are higher than the last time  This is despite her increasing her BOLUSES to about twice as much  Fasting blood sugars have been relatively higher also at home; however late morning in the lab glucose was only 136, was 183 that morning at home with her One Touch ultra monitor  She has readings averaging over 200 later in the day and only occasionally lower  Weight is about the same   Meals: 3 meals per day. Bfst  oatmeal or egg or cereal at 8 am, dinner 5 pm.; no hs snacks   Side effects from medications have been: Metformin: rash  Glucose monitoring:  done 1 time a day         Glucometer:  One Touch.      Blood Glucose readings from home glucose meter download:  Mean values apply above for all meters except median for One Touch  PRE-MEAL Fasting Lunch Dinner Bedtime Overall  Glucose range: 167-231  159-293  215-295  144-332    Mean/median: 184  214    212      Glycemic control:    Lab Results  Component Value Date   HGBA1C 8.1 (H) 10/31/2015   HGBA1C 7.2 (H) 07/31/2015   HGBA1C 7.3 (H) 05/14/2015   Lab Results  Component Value Date   MICROALBUR 0.8 09/19/2014   LDLCALC 101 08/21/2014   CREATININE 0.97 11/21/2015    Self-care: The diet that the patient has been following is: tries to limit fats            Exercise: Walks 20 min, 5/7       Dietician visit: Most recent: 2014 ( class).    Low Carb Breakfast at about 9 am            Compliance with the medical regimen: Good   Weight history:  Wt Readings from Last 3  Encounters:  11/25/15 228 lb (103.4 kg)  11/05/15 227 lb 6.4 oz (103.1 kg)  08/04/15 234 lb (106.1 kg)    Other active problems: See review of systems    Medication List       Accurate as of 11/25/15  1:37 PM. Always use your most recent med list.          aspirin 325 MG tablet Take 325 mg by mouth daily with breakfast.   betamethasone dipropionate 0.05 % cream Commonly known as:  DIPROLENE Apply 1 application topically 2 (two) times daily.   bisoprolol 10 MG tablet Commonly known as:  ZEBETA Take 20 mg by mouth daily.   canagliflozin 300 MG Tabs tablet Commonly known as:  INVOKANA Take 1 tablet (300 mg total) by mouth daily before breakfast.   carbamazepine 200 MG 12 hr capsule Commonly known as:  CARBATROL Take 200 mg by mouth 2 (two) times daily.   clobetasol 0.05 % Gel Commonly known as:  TEMOVATE Apply 1 application topically 2 (two)  times daily.   donepezil 10 MG tablet Commonly known as:  ARICEPT Take 10 mg by mouth daily with supper.   esomeprazole 40 MG capsule Commonly known as:  NEXIUM Take 40 mg by mouth daily with supper.   folic acid 1 MG tablet Commonly known as:  FOLVITE Take 1 mg by mouth daily.   gabapentin 300 MG capsule Commonly known as:  NEURONTIN Take 300 mg by mouth 2 (two) times daily.   glucose blood test strip Commonly known as:  ONETOUCH VERIO Use to test blood sugar 3 times daily   hydrochlorothiazide 25 MG tablet Commonly known as:  HYDRODIURIL Take 1 tablet (25 mg total) by mouth daily.   insulin lispro 100 UNIT/ML injection Commonly known as:  HUMALOG Inject 0.78 mLs (78 Units total) into the skin once. With V-go pump   Insulin Pen Needle 32G X 4 MM Misc Use 8 pen needles per day   levothyroxine 75 MCG tablet Commonly known as:  SYNTHROID, LEVOTHROID Take 75 mcg by mouth daily.   liraglutide 18 MG/3ML Sopn Commonly known as:  VICTOZA INJECT (1.8 MG) DAILY AS DIRECTED   methotrexate 2.5 MG tablet Commonly known as:  RHEUMATREX Take 7.5-10 mg by mouth 3 (three) times daily with meals.   methotrexate 2.5 MG tablet Commonly known as:  RHEUMATREX Take 2.5 mg by mouth once a week. Take 4 every Monday per patient   nitroGLYCERIN 0.4 MG SL tablet Commonly known as:  NITROSTAT Place 0.4 mg under the tongue every 5 (five) minutes as needed for chest pain.   OLANZapine 5 MG tablet Commonly known as:  ZYPREXA Take 5 mg by mouth at bedtime.   olmesartan 40 MG tablet Commonly known as:  BENICAR Take 1 tablet (40 mg total) by mouth daily.   ondansetron 8 MG tablet Commonly known as:  ZOFRAN Take 1 tablet (8 mg total) by mouth every 8 (eight) hours as needed for nausea.   ONE TOUCH ULTRA 2 w/Device Kit Use to check blood sugar 5 times per day Dx code Q59.56   [facility] DELICA LANCETS 38V Misc Use to check blood sugar 5 times per day dx code E11.65   solifenacin 5 MG  tablet Commonly known as:  VESICARE Take 5 mg by mouth daily with breakfast.   spironolactone 50 MG tablet Commonly known as:  ALDACTONE Take 1 tablet (50 mg total) by mouth daily.   sucralfate 1 g tablet Commonly known as:  CARAFATE  Take 1 g by mouth 4 (four) times daily -  with meals and at bedtime.   V-GO 40 Kit USE ONE PER DAY AS DIRECTED   Vitamin D 2000 units tablet Take 2,000 Units by mouth daily with breakfast.   ZENPEP 25000 units Cpep Generic drug:  Pancrelipase (Lip-Prot-Amyl) Take 25,000 tablets by mouth See admin instructions. Reported on 08/04/2015       Allergies:  Allergies  Allergen Reactions  . Flagyl [Metronidazole Hcl] Itching and Rash  . Ciprofloxacin Itching and Rash  . Metformin And Related Rash  . Milk-Related Compounds Other (See Comments)    Stomach pains  . Other Other (See Comments)    [place] nuts cause severe facial redness    Past Medical History:  Diagnosis Date  . Depression    Bipolar  . Diabetes mellitus    diet controlled  . GERD (gastroesophageal reflux disease)   . Hyperlipidemia   . Hypertension   . Ischemic colitis, enteritis, or enterocolitis ([place])   . Sleep apnea   . Stroke ([place])    x's 2  . Thyroid disease   . Urinary bladder incontinence   . Vertigo     Past Surgical History:  Procedure Laterality Date  . ABDOMINAL HYSTERECTOMY     partial   . APPENDECTOMY    . CARDIAC CATHETERIZATION N/A 12/24/2014   Procedure: Right/Left Heart Cath and Coronary Angiography;  Surgeon: Sanda Klein, MD;  Location: [facility];  Service: Cardiovascular;  Laterality: N/A;  . CHOLECYSTECTOMY    . FRACTURE SURGERY    . HEMORRHOID SURGERY    . RETINAL DETACHMENT SURGERY    . TONSILLECTOMY      Family History  Problem Relation Age of Onset  . Heart disease Mother   . Stroke Father   . Cancer Sister   . Cancer Brother   . Parkinson's disease Father     Social History:  reports that she has never smoked. She has  never used smokeless tobacco. She reports that she does not drink alcohol or use drugs.    Review of Systems   She has had less edema and takes Lasix regularly now  NEUROPATHY: She has lower leg pain and takes gabapentin twice a day for this, however does not have any symptoms      Lipids: Last LDL from PCP was 101, followed by PCP also        Lab Results  Component Value Date   CHOL 153 04/26/2014   HDL 55.60 04/26/2014   LDLCALC 101 08/21/2014   TRIG 113.0 04/26/2014   CHOLHDL 3 04/26/2014       Thyroid:   Has had presumed hypothyroidism for over 20 years, has not had a change in medications in several years and last TSH was 1.9 in August from PCP   Lab Results  Component Value Date   TSH 2.39 01/27/2015   TSH 1.90 08/21/2014   TSH 1.65 01/31/2014   FREET4 0.86 01/31/2014   FREET4 0.91 08/28/2013       The blood pressure has been controlled with a combination of amlodipine and Benicar as well as hydralazine and bisoprolol  Blood pressure is ok at home     Physical Examination:  BP 130/86   Pulse 64   Ht _0  (1.6 m)   Wt 228 lb (103.4 kg)   SpO2 96%   BMI 40.39 kg/m     1+  edema present. She does not look pale  ASSESSMENT/PLAN:  Diabetes type 2, uncontrolled   See history of present illness for detailed discussion of his current management, blood sugar patterns and problems identified Her blood sugars are excellent with the V-go pump and this has improved her compliance with mealtime insulin. Also has less variability with this A1c has come down further and is upper normal now at 6.2  She misunderstood and stopped checking her blood sugars nonfasting, discussed need for checking postprandial readings to help adjust her boluses and monitor effects of various foods. Discussed blood sugar targets May adjust her boluses by one click based on what she is eating  She can reduce frequency of monitoring in the morning Her monitor was reset to  the correct  daytime time She can switch Novolog to Humalog since her insurance will not cover Novolog now, should not make any difference in her level of control  Follow-up in 3 months again  NEUROPATHY: Symptomatically better and she can take gabapentin as needed only  HYPOTHYROIDISM: Her TSH is normal with 75 g and she will continue the same dose  Patient Instructions  More sugars at nite  Victoza 0.6 daily then go up 5 clicks next week and 1 more week 0.9QZ if no nausea  Click 3--0--0 at meal  Invokana 2 pills of 119m  Next Rx will be 3073m Invokana      Counseling time on subjects discussed above is over 50% of today's 25 minute visit   Emitt Maglione 11/25/2015, 1:37 PM   Note: This office note was prepared with Dragon voice recognition system technology. Any transcriptional errors that result from this process are unintentional.                     Patient ID: Mary Rushfemale   DOB: 04/1940-05-107420.o.   MRN: [id number]  Reason for Appointment: Followup for Type 2 Diabetes and swelling of legs  History of Present Illness:          Diagnosis: Type 2 diabetes mellitus, date of diagnosis:2011       Past history: Since she was apparently intolerant to metformin she was treated with Onglyza until about 2014  At that time because of increasing A1c of about 8% she was started on Levemir 15 units and this was aggressively increased Onglyza was continued She has not tried any other treatments for diabetes Her A1c has been 10-10.5 in 2015 In 3/15 she was told to start small doses of NovoLog at lunch and supper and add 15 units of Levemir at night also To control  hyperglycemia with her basal bolus insulin regimen she was given Victoza in addition on her initial consultation in 5/15  Recent history:   INSULIN regimen :  V-go pump 40 unit basal, boluses at meal times: 6-6-8 units  She was changed on 09/19/14 to the V-go pump instead of basal bolus insulin  shots She has been on Victoza since 8/15 and she is taking 1.2 mg With the V-go pump her blood sugars have improved overall and are less variable  Current blood sugar patterns and problems:  Fasting blood sugars are very consistent, usually just above 100  She has stopped checking her blood sugars later in the day even though she was told to vary the times when she checks her sugar  She has no difficulty remembering to do her boluses with meals but usually does not change the amounts  She has improved her diet and is trying to  eliminate high glycemic index foods and juices  Recently trying to walk more regularly also  No hypoglycemia  She has no difficulty with using the V-go pump, doing the boluses when she is eating out and changing it every morning    Oral hypoglycemic drugs the patient is taking are: None    Side effects from medications have been: Metformin: rash  Glucose monitoring:  done 1 time a day         Glucometer:  One Touch.      Blood Glucose readings from home glucose meter download:  FASTING readings recently: 90-147, usually under 130.  Her monitor has incorrect date and time programmed   Glycemic control:   A1c was 7  from PCP in 8/16  Lab Results  Component Value Date   HGBA1C 8.1 (H) 10/31/2015   HGBA1C 7.2 (H) 07/31/2015   HGBA1C 7.3 (H) 05/14/2015   Lab Results  Component Value Date   MICROALBUR 0.8 09/19/2014   LDLCALC 101 08/21/2014   CREATININE 0.97 11/21/2015    Self-care: The diet that the patient has been following is: tries to limit fats     Meals: 3 meals per day. Bfst oatmeal or egg/toast at 8-9 am, dinner 5 pm cutting back on frequent meals and  portions; no hs snacks           Exercise: Walks 15-20 min       Dietician visit: Most recent: 2014 ( class).               Compliance with the medical regimen: Good  Retinal exam: Most recent:.9/14    Weight history:  Wt Readings from Last 3 Encounters:  11/25/15 228 lb (103.4 kg)   11/05/15 227 lb 6.4 oz (103.1 kg)  08/04/15 234 lb (106.1 kg)      Medication List       Accurate as of 11/25/15  1:37 PM. Always use your most recent med list.          aspirin 325 MG tablet Take 325 mg by mouth daily with breakfast.   betamethasone dipropionate 0.05 % cream Commonly known as:  DIPROLENE Apply 1 application topically 2 (two) times daily.   bisoprolol 10 MG tablet Commonly known as:  ZEBETA Take 20 mg by mouth daily.   canagliflozin 300 MG Tabs tablet Commonly known as:  INVOKANA Take 1 tablet (300 mg total) by mouth daily before breakfast.   carbamazepine 200 MG 12 hr capsule Commonly known as:  CARBATROL Take 200 mg by mouth 2 (two) times daily.   clobetasol 0.05 % Gel Commonly known as:  TEMOVATE Apply 1 application topically 2 (two) times daily.   donepezil 10 MG tablet Commonly known as:  ARICEPT Take 10 mg by mouth daily with supper.   esomeprazole 40 MG capsule Commonly known as:  NEXIUM Take 40 mg by mouth daily with supper.   folic acid 1 MG tablet Commonly known as:  FOLVITE Take 1 mg by mouth daily.   gabapentin 300 MG capsule Commonly known as:  NEURONTIN Take 300 mg by mouth 2 (two) times daily.   glucose blood test strip Commonly known as:  ONETOUCH VERIO Use to test blood sugar 3 times daily   hydrochlorothiazide 25 MG tablet Commonly known as:  HYDRODIURIL Take 1 tablet (25 mg total) by mouth daily.   insulin lispro 100 UNIT/ML injection Commonly known as:  HUMALOG Inject 0.78 mLs (78 Units total) into the skin once. With V-go pump  Insulin Pen Needle 32G X 4 MM Misc Use 8 pen needles per day   levothyroxine 75 MCG tablet Commonly known as:  SYNTHROID, LEVOTHROID Take 75 mcg by mouth daily.   liraglutide 18 MG/3ML Sopn Commonly known as:  VICTOZA INJECT (1.8 MG) DAILY AS DIRECTED   methotrexate 2.5 MG tablet Commonly known as:  RHEUMATREX Take 7.5-10 mg by mouth 3 (three) times daily with meals.    methotrexate 2.5 MG tablet Commonly known as:  RHEUMATREX Take 2.5 mg by mouth once a week. Take 4 every Monday per patient   nitroGLYCERIN 0.4 MG SL tablet Commonly known as:  NITROSTAT Place 0.4 mg under the tongue every 5 (five) minutes as needed for chest pain.   OLANZapine 5 MG tablet Commonly known as:  ZYPREXA Take 5 mg by mouth at bedtime.   olmesartan 40 MG tablet Commonly known as:  BENICAR Take 1 tablet (40 mg total) by mouth daily.   ondansetron 8 MG tablet Commonly known as:  ZOFRAN Take 1 tablet (8 mg total) by mouth every 8 (eight) hours as needed for nausea.   ONE TOUCH ULTRA 2 w/Device Kit Use to check blood sugar 5 times per day Dx code Z61.09   [facility] DELICA LANCETS 60A Misc Use to check blood sugar 5 times per day dx code E11.65   solifenacin 5 MG tablet Commonly known as:  VESICARE Take 5 mg by mouth daily with breakfast.   spironolactone 50 MG tablet Commonly known as:  ALDACTONE Take 1 tablet (50 mg total) by mouth daily.   sucralfate 1 g tablet Commonly known as:  CARAFATE Take 1 g by mouth 4 (four) times daily -  with meals and at bedtime.   V-GO 40 Kit USE ONE PER DAY AS DIRECTED   Vitamin D 2000 units tablet Take 2,000 Units by mouth daily with breakfast.   ZENPEP 25000 units Cpep Generic drug:  Pancrelipase (Lip-Prot-Amyl) Take 25,000 tablets by mouth See admin instructions. Reported on 08/04/2015       Allergies:  Allergies  Allergen Reactions  . Flagyl [Metronidazole Hcl] Itching and Rash  . Ciprofloxacin Itching and Rash  . Metformin And Related Rash  . Milk-Related Compounds Other (See Comments)    Stomach pains  . Other Other (See Comments)    [place] nuts cause severe facial redness    Past Medical History:  Diagnosis Date  . Depression    Bipolar  . Diabetes mellitus    diet controlled  . GERD (gastroesophageal reflux disease)   . Hyperlipidemia   . Hypertension   . Ischemic colitis, enteritis, or  enterocolitis ([place])   . Sleep apnea   . Stroke ([place])    x's 2  . Thyroid disease   . Urinary bladder incontinence   . Vertigo     Past Surgical History:  Procedure Laterality Date  . ABDOMINAL HYSTERECTOMY     partial   . APPENDECTOMY    . CARDIAC CATHETERIZATION N/A 12/24/2014   Procedure: Right/Left Heart Cath and Coronary Angiography;  Surgeon: Sanda Klein, MD;  Location: [place] [facility];  Service: Cardiovascular;  Laterality: N/A;  . CHOLECYSTECTOMY    . FRACTURE SURGERY    . HEMORRHOID SURGERY    . RETINAL DETACHMENT SURGERY    . TONSILLECTOMY      Family History  Problem Relation Age of Onset  . Heart disease Mother   . Stroke Father   . Cancer Sister   . Cancer Brother   .  Parkinson's disease Father     Social History:  reports that she has never smoked. She has never used smokeless tobacco. She reports that she does not drink alcohol or use drugs.    Review of Systems    HYPONATREMIA:   With stopping HCTZ and this has resolved, previously sodium was 129   Lab Results  Component Value Date   CREATININE 0.97 11/21/2015   BUN 16 11/21/2015   NA 138 11/21/2015   K 4.8 11/21/2015   CL 103 11/21/2015   CO2 28 11/21/2015     NEUROPATHY: She has lower leg pain and takes gabapentin twice a day for this with relief      Lipids: Last LDL from PCP was 92,Treatment regimen followed by PCP         Lab Results  Component Value Date   CHOL 153 04/26/2014   HDL 55.60 04/26/2014   LDLCALC 101 08/21/2014   TRIG 113.0 04/26/2014   CHOLHDL 3 04/26/2014       Thyroid:    Has had presumed hypothyroidism for over 20 years, has not had a change in medications in several years and last TSH was 3.7 in August2017   Lab Results  Component Value Date   TSH 2.39 01/27/2015   TSH 1.90 08/21/2014   TSH 1.65 01/31/2014   FREET4 0.86 01/31/2014   FREET4 0.91 08/28/2013       The blood pressure has been controlled with a combination of Bisoprolol and Benicar,   as well as Aldactone.  Also on Invokana 100 mg now  She is being treated for lymphedema and is wearing support stockings   Physical Examination:  BP 130/86   Pulse 64   Ht _0  (1.6 m)   Wt 228 lb (103.4 kg)   SpO2 96%   BMI 40.39 kg/m    ASSESSMENT/PLAN:   Diabetes type 2, uncontrolled   See history of present illness for detailed discussion of current  management, blood sugar patterns and problems identified.  Her blood sugars are more difficult to control now and overall higher with stopping Victoza Although previously she did not appear in benefiting from restarting Victoza this could not be continued because of nausea Also 100 mg of Invokana blood sugars are not consistently improved  Currently unable to know whether her home monitor is accurate or not since lab glucose and FRUCTOSAMINE are excellent whereas her home readings are averaging over 200 now This is despite taking higher boluses now, she is tending to run out of her insulin pumps as she is changing the more often than once a day  Recommended the following:  Continue the V-go pump with 40 unit basal, given additional sample of the pumps  She will increase Invokana to 300 mg  Trial of Victoza again starting with 0.6 and increasing by 0.3 mg every 5-7 days until she tends to get nausea but not exceed 1.2 mg daily  More consistent monitoring  She can reduce her boluses to 5 units at breakfast and lunch and may need to reduce further if Victoza is effective  She will start using the One Touch Verio flex monitor given  Discussed blood sugar targets  Follow-up in 6 weeks  HYPONATREMIA: Resolved with stopping HCTZ  Hypertension: Blood pressure is high normal, will probably benefit from increasing Invokana   Patient Instructions  More sugars at nite  Victoza 0.6 daily then go up 5 clicks next week and 1 more week 1.15m if no nausea  Click 3--8--2 at meal  Invokana 2 pills of 166m  Next Rx will be  3024m Invokana       Counseling time on subjects discussed above is over 50% of today's 25 minute visit   Uriyah Massimo 11/25/2015, 1:37 PM   Note: This office note was prepared with DrEstate agentAny transcriptional errors that result from this process are unintentional.

## 2015-11-25 NOTE — Patient Instructions (Addendum)
More sugars at nite  Victoza 0.6 daily then go up 5 clicks next week and 1 more week 1.2mg  if no nausea  Click 5--5--8 at meal  Invokana 2 pills of 100mg   Next Rx will be 300mg   Invokana

## 2015-11-26 ENCOUNTER — Ambulatory Visit: Payer: Medicare Other | Attending: Internal Medicine

## 2015-11-26 DIAGNOSIS — R29898 Other symptoms and signs involving the musculoskeletal system: Secondary | ICD-10-CM

## 2015-11-26 DIAGNOSIS — I890 Lymphedema, not elsewhere classified: Secondary | ICD-10-CM

## 2015-11-26 NOTE — Therapy (Addendum)
[facility] [place], [place], [address] Phone: [phone]   Fax:  [phone]  Physical Therapy Treatment  Patient Details  Name: [name] MRN: [id number] Date of Birth: 1940/09/29 Referring Provider: Marnee Spring, MD  Encounter Date: 11/26/2015      PT End of Session - 11/26/15 1211    Visit Number 9   Number of Visits 11   Date for PT Re-Evaluation 12/11/15   PT Start Time 1108   PT Stop Time 1201   PT Time Calculation (min) 53 min   Activity Tolerance Patient tolerated treatment well   Behavior During Therapy [facility] for tasks assessed/performed      Past Medical History:  Diagnosis Date  . Depression    Bipolar  . Diabetes mellitus    diet controlled  . GERD (gastroesophageal reflux disease)   . Hyperlipidemia   . Hypertension   . Ischemic colitis, enteritis, or enterocolitis ([place])   . Sleep apnea   . Stroke ([place])    x's 2  . Thyroid disease   . Urinary bladder incontinence   . Vertigo     Past Surgical History:  Procedure Laterality Date  . ABDOMINAL HYSTERECTOMY     partial   . APPENDECTOMY    . CARDIAC CATHETERIZATION N/A 12/24/2014   Procedure: Right/Left Heart Cath and Coronary Angiography;  Surgeon: Sanda Klein, MD;  Location: [facility];  Service: Cardiovascular;  Laterality: N/A;  . CHOLECYSTECTOMY    . FRACTURE SURGERY    . HEMORRHOID SURGERY    . RETINAL DETACHMENT SURGERY    . TONSILLECTOMY      There were no vitals filed for this visit.      Subjective Assessment - 11/26/15 1110    Subjective I've been doing really good. I only had 2 flare ups of swelling since I was here and I just sat down and elevated my feet and did the self manual lymph drainage as good as I could and it was down the next day. [place] [employer] [place] 2 hours and surprisingly that didn't flare up my feet/ankles, really don't know what did  because I was wearing my compression stockings.     Pertinent History Patient has a history of gallbladder surgery and abdominal hysterectomy, diet-controlled diabetes, ischemic colitis, and 2 previous strokes.   Patient Stated Goals to know that the swelling is under control and what I can do to keep it that way   Currently in Pain? No/denies            [facility] PT Assessment - 11/26/15 0001      Observation/Other Assessments   Observations No pitting edema   Skin Integrity Skin integrity is much improved and no longer dry   Other Surveys  --  LLIS: 12% impairement           LYMPHEDEMA/ONCOLOGY QUESTIONNAIRE - 11/26/15 1114      Right Lower Extremity Lymphedema   At Midpatella/Popliteal Crease 39.9 cm   30 cm Proximal to Floor at Lateral Plantar Foot 41.3 cm   20 cm Proximal to Floor at Lateral Plantar Foot 31.4 1   10  cm Proximal to Floor at Lateral Malleoli 25.1 cm   5 cm Proximal to 1st MTP Joint 23.4 cm   Across MTP Joint 20.7 cm   Around Proximal Great Toe 7.2 cm     Left Lower Extremity Lymphedema   At Midpatella/Popliteal  Crease 40.6 cm   30 cm Proximal to Floor at Lateral Plantar Foot 40.4 cm   20 cm Proximal to Floor at Lateral Plantar Foot 30.6 cm   10 cm Proximal to Floor at Lateral Malleoli 24.6 cm   5 cm Proximal to 1st MTP Joint 23.6 cm   Across MTP Joint 21.2 cm   Around Proximal Great Toe 7.4 cm                  [facility] Adult PT Treatment/Exercise - 11/26/15 0001      Manual Therapy   Manual Lymphatic Drainage (MLD) In Supine: Short neck, superficial abdominals and 5 diaphragmatic breaths, Rt axillary nodes, Rt inguino-axillary anastomosis, and Rt LE from thigh to dorsum of foot working proximal to distal then retracing all steps, repeated on L side                        Long Term Clinic Goals - 11/26/15 1219      CC Long Term Goal  #1   Title Patient will be knowledgeable in lymphedema risk reduction practices.   Status  Achieved     CC Long Term Goal  #2   Title Patient will obtain compression garments and be independent in donning/doffing them.   Status Achieved     CC Long Term Goal  #3   Title Patient will be knowledgeable in conservative management of lymphedema.   Baseline 10/31/15- instructed patient in self MLD technique today and had patient return demonstrate - issued handout, 11/06/15- pt has not practiced technique because she was sick, 11/13/15- Pt feels more comfortable with self drainage technique but would like to come back in two weeks to assess measurements to see if she is able to control it independently; In past 2 weeks since pt has been seen she has worn her compression stockings daily and performs her self MLD every morning before donning stockings. 11/26/15   Status Achieved            Plan - 11/26/15 1212    Clinical Impression Statement Pt has had great reductions in her lower legs since last visit 2 weeks ago and she reports doing much better with getting her compression stockings on in the morning. It's still a challenge but she has just made it part of her morning routine and so she reports being positive about it and knowing it helps keep her fluid reduced has made her want to stay compliant with wear. Pt reports feels ready for D/C at this time. Also issued her an Marketing executive to assist her with reordering her garments possibly cheaper.    Rehab Potential Good   Clinical Impairments Affecting Rehab Potential none   PT Frequency Biweekly   PT Duration 4 weeks   PT Treatment/Interventions ADLs/Self Care Home Management;Therapeutic exercise;Therapeutic activities;DME Instruction;Patient/family education;Manual techniques;Manual lymph drainage;Compression bandaging;Passive range of motion;Taping   PT Next Visit Plan D/C this visit.    Consulted and Agree with Plan of Care Patient      Patient will benefit from skilled therapeutic intervention in order to improve the  following deficits and impairments:  Increased edema, Postural dysfunction  Visit Diagnosis: Lymphedema, not elsewhere classified  Other symptoms and signs involving the musculoskeletal system     Problem List Patient Active Problem List   Diagnosis Date Noted  . Dyspnea 01/27/2015  . Atypical chest pain 12/24/2014  . SOB (shortness of breath)   . Accelerating angina ([place]) 12/23/2014  .  Chest pain at rest 10/30/2013  . Exertional dyspnea 10/30/2013  . Hyperlipidemia 10/30/2013  . Morbid obesity ([place]) 10/30/2013  . Acquired autoimmune hypothyroidism 09/02/2013  . Type II or unspecified type diabetes mellitus without mention of complication, uncontrolled 06/14/2013  . Essential hypertension, benign 06/14/2013  . Hyperlipemia 06/14/2013    Otelia Limes, PTA 11/26/2015, 12:21 PM  [facility] [place], [place], [address] Phone: [phone]   Fax:  [phone]  Name: [name] MRN: [id number] Date of Birth: [birth year]   .PHYSICAL THERAPY DISCHARGE SUMMARY  Visits from Start of Care: 9  Current functional level related to goals / functional outcomes: Pt is wearing compression stockings    Remaining deficits: See above    Education / Equipment: consrevative treatment of lymphedema  Plan: Patient agrees to discharge.  Patient goals were met. Patient is being discharged due to meeting the stated rehab goals.  ?????    Maudry Diego, PT 12/05/15 8:14 AM

## 2015-11-28 ENCOUNTER — Other Ambulatory Visit: Payer: Self-pay

## 2015-11-28 ENCOUNTER — Telehealth: Payer: Self-pay | Admitting: Endocrinology

## 2015-11-28 MED ORDER — ONETOUCH DELICA LANCETS 33G MISC: 2 refills | Status: DC

## 2015-11-28 NOTE — Telephone Encounter (Signed)
Done

## 2015-11-28 NOTE — Telephone Encounter (Signed)
[facility] a [facility]   [facility] [employer] [address] - [place], [place] - [address] [phone] (Phone) [phone] (Fax)

## 2015-12-19 ENCOUNTER — Other Ambulatory Visit (INDEPENDENT_AMBULATORY_CARE_PROVIDER_SITE_OTHER): Payer: Medicare Other

## 2015-12-19 DIAGNOSIS — E1165 Type 2 diabetes mellitus with hyperglycemia: Secondary | ICD-10-CM | POA: Diagnosis not present

## 2015-12-19 DIAGNOSIS — Z794 Long term (current) use of insulin: Secondary | ICD-10-CM

## 2015-12-19 LAB — COMPREHENSIVE METABOLIC PANEL
ALBUMIN: 3.9 g/dL (ref 3.5–5.2)
ALT: 28 U/L (ref 0–35)
AST: 25 U/L (ref 0–37)
Alkaline Phosphatase: 102 U/L (ref 39–117)
BILIRUBIN TOTAL: 0.3 mg/dL (ref 0.2–1.2)
BUN: 15 mg/dL (ref 6–23)
CALCIUM: 9.3 mg/dL (ref 8.4–10.5)
CHLORIDE: 104 meq/L (ref 96–112)
CO2: 28 meq/L (ref 19–32)
CREATININE: 1 mg/dL (ref 0.40–1.20)
GFR: 57.51 mL/min — ABNORMAL LOW (ref >60.00)
Glucose, Bld: 162 mg/dL — ABNORMAL HIGH (ref 70–99)
Potassium: 4.6 mEq/L (ref 3.5–5.1)
SODIUM: 140 meq/L (ref 135–145)
Total Protein: 7.1 g/dL (ref 6.0–8.3)

## 2015-12-19 LAB — TSH: TSH: 1.32 u[IU]/mL (ref 0.35–4.50)

## 2015-12-19 LAB — HEMOGLOBIN A1C: Hgb A1c MFr Bld: 7.6 % — ABNORMAL HIGH (ref 4.6–6.5)

## 2015-12-22 ENCOUNTER — Ambulatory Visit (INDEPENDENT_AMBULATORY_CARE_PROVIDER_SITE_OTHER): Payer: Medicare Other | Admitting: Endocrinology

## 2015-12-22 ENCOUNTER — Encounter: Payer: Self-pay | Admitting: Endocrinology

## 2015-12-22 ENCOUNTER — Ambulatory Visit (INDEPENDENT_AMBULATORY_CARE_PROVIDER_SITE_OTHER): Payer: Medicare Other | Admitting: Ophthalmology

## 2015-12-22 VITALS — BP 120/68 | HR 76 | Temp 97.7°F | Wt 227.0 lb

## 2015-12-22 DIAGNOSIS — E113391 Type 2 diabetes mellitus with moderate nonproliferative diabetic retinopathy without macular edema, right eye: Secondary | ICD-10-CM | POA: Diagnosis not present

## 2015-12-22 DIAGNOSIS — E1165 Type 2 diabetes mellitus with hyperglycemia: Secondary | ICD-10-CM | POA: Diagnosis not present

## 2015-12-22 DIAGNOSIS — H338 Other retinal detachments: Secondary | ICD-10-CM

## 2015-12-22 DIAGNOSIS — H43813 Vitreous degeneration, bilateral: Secondary | ICD-10-CM | POA: Diagnosis not present

## 2015-12-22 DIAGNOSIS — E038 Other specified hypothyroidism: Secondary | ICD-10-CM

## 2015-12-22 DIAGNOSIS — E063 Autoimmune thyroiditis: Secondary | ICD-10-CM

## 2015-12-22 DIAGNOSIS — E113292 Type 2 diabetes mellitus with mild nonproliferative diabetic retinopathy without macular edema, left eye: Secondary | ICD-10-CM

## 2015-12-22 DIAGNOSIS — I10 Essential (primary) hypertension: Secondary | ICD-10-CM

## 2015-12-22 DIAGNOSIS — H35033 Hypertensive retinopathy, bilateral: Secondary | ICD-10-CM | POA: Diagnosis not present

## 2015-12-22 DIAGNOSIS — Z794 Long term (current) use of insulin: Secondary | ICD-10-CM

## 2015-12-22 DIAGNOSIS — E11319 Type 2 diabetes mellitus with unspecified diabetic retinopathy without macular edema: Secondary | ICD-10-CM

## 2015-12-22 NOTE — Patient Instructions (Signed)
[place] pen for balance of clicks in am  Protein in am daily

## 2015-12-22 NOTE — Progress Notes (Signed)
Patient ID: [name], female   DOB: [birth year], 75 y.o.   MRN: [id number]    Reason for Appointment: Followup for Type 2 Diabetes    History of Present Illness:          Diagnosis: Type 2 diabetes mellitus, date of diagnosis:2011       Past history: Since she was apparently intolerant to metformin she was treated with Onglyza until about 2014  At that time because of increasing A1c of about 8% she was started on Levemir 15 units and this was aggressively increased Onglyza was continued She has not tried any other treatments for diabetes Her A1c has been 10-10.5 in 2015 In 3/15 she was told to start small doses of NovoLog at lunch and supper and add 15 units of Levemir at night also To control  hyperglycemia with her basal bolus insulin regimen she was given Victoza in addition on her initial consultation in 5/15 She has been on Victoza since 8/15   Recent history:   INSULIN regimen :  V-go pump 40 unit basal, boluses at meal times: 12-30-14 units  Noninsulin hypoglycemic drugs the patient is taking are: Invokana 300 mg, on VICTOZA 1.2 mg  She was changed on 09/19/14 to the V-go pump instead of basal bolus insulin shots  Current management, blood sugar patterns and problems:  She was started back on Victoza since her nausea had improved and blood sugars were getting higher  She has tolerated the dose of 1.2 mg Victoza  Also her Invokana was increased up to 300 mg daily  She was given a new glucose monitor as her One Touch ultra monitor was giving falsely high readings  Difficult to interpret her glucose monitor download as her date and time is not correctly programmed and blood sugar patterns could not be analyzed  However her blood sugars have been ranging between 100-226 but mostly under 180  However fasting readings in the last few days have been about 140-158 range  Highest postprandial reading recently 179  She says that she will sometimes none  boluses for her breakfast meal since she is changing her pump at lunchtime but is not taking any extra insulin with her syringe or Humalog pen  She says that although her satiety is better she gets hungry at 10 AM, sometimes will eat oatmeal or cereal without any protein and she did have a glucose of 226 after breakfast once.  Otherwise she will have eggs or some form of meat at breakfast  Weight is about the same   Meals: 3 meals per day. Bfst oatmeal or egg or cereal at 8 am, dinner 5 pm.; no hs snacks   Side effects from medications have been: Metformin: rash  Glucose monitoring:  done 1 time a day         Glucometer:  One Touch.      Blood Glucose readings from home glucose meter download: see above   Glycemic control:    Lab Results  Component Value Date   HGBA1C 7.6 (H) 12/19/2015   HGBA1C 8.1 (H) 10/31/2015   HGBA1C 7.2 (H) 07/31/2015   Lab Results  Component Value Date   MICROALBUR 0.8 09/19/2014   LDLCALC 101 08/21/2014   CREATININE 1.00 12/19/2015    Self-care: The diet that the patient has been following is: tries to limit fats            Exercise: Walks 20 min, 5/7, Less with cold weather  Dietician visit: Most recent: 2014 ( class).   CDE visit: 9/16     Compliance with the medical regimen: Good   Weight history:  Wt Readings from Last 3 Encounters:  12/22/15 227 lb (103 kg)  11/25/15 228 lb (103.4 kg)  11/05/15 227 lb 6.4 oz (103.1 kg)    Other active problems: See review of systems    Medication List       Accurate as of 12/22/15  2:22 PM. Always use your most recent med list.          aspirin 325 MG tablet Take 325 mg by mouth daily with breakfast.   betamethasone dipropionate 0.05 % cream Commonly known as:  DIPROLENE Apply 1 application topically 2 (two) times daily.   bisoprolol 10 MG tablet Commonly known as:  ZEBETA Take 20 mg by mouth daily.   canagliflozin 300 MG Tabs tablet Commonly known as:  INVOKANA Take 1 tablet  (300 mg total) by mouth daily before breakfast.   carbamazepine 200 MG 12 hr capsule Commonly known as:  CARBATROL Take 200 mg by mouth 2 (two) times daily.   clobetasol 0.05 % Gel Commonly known as:  TEMOVATE Apply 1 application topically 2 (two) times daily.   diltiazem 300 MG 24 hr capsule Commonly known as:  CARDIZEM CD Take 300 mg by mouth daily.   donepezil 10 MG tablet Commonly known as:  ARICEPT Take 10 mg by mouth daily with supper.   esomeprazole 40 MG capsule Commonly known as:  NEXIUM Take 40 mg by mouth daily with supper.   folic acid 1 MG tablet Commonly known as:  FOLVITE Take 1 mg by mouth daily.   gabapentin 300 MG capsule Commonly known as:  NEURONTIN Take 300 mg by mouth 2 (two) times daily.   glucose blood test strip Commonly known as:  ONETOUCH VERIO Use to test blood sugar 3 times daily   insulin lispro 100 UNIT/ML injection Commonly known as:  HUMALOG Inject 0.78 mLs (78 Units total) into the skin once. With V-go pump   Insulin Pen Needle 32G X 4 MM Misc Use 8 pen needles per day   levothyroxine 75 MCG tablet Commonly known as:  SYNTHROID, LEVOTHROID Take 75 mcg by mouth daily.   liraglutide 18 MG/3ML Sopn Commonly known as:  VICTOZA INJECT (1.8 MG) DAILY AS DIRECTED   methotrexate 2.5 MG tablet Commonly known as:  RHEUMATREX Take 2.5 mg by mouth once a week. Take 4 every Monday per patient   nitroGLYCERIN 0.4 MG SL tablet Commonly known as:  NITROSTAT Place 0.4 mg under the tongue every 5 (five) minutes as needed for chest pain.   OLANZapine 5 MG tablet Commonly known as:  ZYPREXA Take 5 mg by mouth at bedtime.   olmesartan 40 MG tablet Commonly known as:  BENICAR Take 1 tablet (40 mg total) by mouth daily.   ondansetron 8 MG tablet Commonly known as:  ZOFRAN Take 1 tablet (8 mg total) by mouth every 8 (eight) hours as needed for nausea.   ONETOUCH DELICA LANCETS 79G Misc Use to check blood sugar 5 times per day dx code  E11.65   solifenacin 5 MG tablet Commonly known as:  VESICARE Take 5 mg by mouth daily with breakfast.   spironolactone 50 MG tablet Commonly known as:  ALDACTONE Take 1 tablet (50 mg total) by mouth daily.   sucralfate 1 g tablet Commonly known as:  CARAFATE Take 1 g by mouth 4 (four) times daily -  with meals and at bedtime.   V-GO 40 Kit USE ONCE DAILY AS DIRECTED   Vitamin D 2000 units tablet Take 2,000 Units by mouth daily with breakfast.   ZENPEP 25000 units Cpep Generic drug:  Pancrelipase (Lip-Prot-Amyl) Take 25,000 tablets by mouth See admin instructions. Reported on 08/04/2015       Allergies:  Allergies  Allergen Reactions  . Flagyl [Metronidazole Hcl] Itching and Rash  . Ciprofloxacin Itching and Rash  . Metformin And Related Rash  . Milk-Related Compounds Other (See Comments)    Stomach pains  . Other Other (See Comments)    [place] nuts cause severe facial redness    Past Medical History:  Diagnosis Date  . Depression    Bipolar  . Diabetes mellitus    diet controlled  . GERD (gastroesophageal reflux disease)   . Hyperlipidemia   . Hypertension   . Ischemic colitis, enteritis, or enterocolitis ([place])   . Sleep apnea   . Stroke ([place])    x's 2  . Thyroid disease   . Urinary bladder incontinence   . Vertigo     Past Surgical History:  Procedure Laterality Date  . ABDOMINAL HYSTERECTOMY     partial   . APPENDECTOMY    . CARDIAC CATHETERIZATION N/A 12/24/2014   Procedure: Right/Left Heart Cath and Coronary Angiography;  Surgeon: Sanda Klein, MD;  Location: [facility];  Service: Cardiovascular;  Laterality: N/A;  . CHOLECYSTECTOMY    . FRACTURE SURGERY    . HEMORRHOID SURGERY    . RETINAL DETACHMENT SURGERY    . TONSILLECTOMY      Family History  Problem Relation Age of Onset  . Heart disease Mother   . Stroke Father   . Cancer Sister   . Cancer Brother   . Parkinson's disease Father     Social History:  reports that she  has never smoked. She has never used smokeless tobacco. She reports that she does not drink alcohol or use drugs.    Review of Systems   She has had less edema and takes Lasix regularly now  NEUROPATHY: She has lower leg pain and takes gabapentin twice a day for this, however does not have any symptoms      Lipids: Last LDL from PCP was 101, followed by PCP also        Lab Results  Component Value Date   CHOL 153 04/26/2014   HDL 55.60 04/26/2014   LDLCALC 101 08/21/2014   TRIG 113.0 04/26/2014   CHOLHDL 3 04/26/2014       Thyroid:   Has had presumed hypothyroidism for over 20 years, has not had a change in medications in several years and last TSH was 1.9 in August from PCP   Lab Results  Component Value Date   TSH 1.32 12/19/2015   TSH 2.39 01/27/2015   TSH 1.90 08/21/2014   FREET4 0.86 01/31/2014   FREET4 0.91 08/28/2013       The blood pressure has been controlled with a combination of amlodipine and Benicar as well as hydralazine and bisoprolol  Blood pressure is ok at home     Physical Examination:  BP 120/68 (BP Location: Left Arm, Patient Position: Sitting, Cuff Size: Large)   Pulse 76   Temp 97.7 F (36.5 C) (Oral)   Wt 227 lb (103 kg)   BMI 40.21 kg/m     1+  edema present. She does not look pale  ASSESSMENT/PLAN:   Diabetes type 2,  uncontrolled   See history of present illness for detailed discussion of his current management, blood sugar patterns and problems identified Her blood sugars are excellent with the V-go pump and this has improved her compliance with mealtime insulin. Also has less variability with this A1c has come down further and is upper normal now at 6.2  She misunderstood and stopped checking her blood sugars nonfasting, discussed need for checking postprandial readings to help adjust her boluses and monitor effects of various foods. Discussed blood sugar targets May adjust her boluses by one click based on what she is  eating  She can reduce frequency of monitoring in the morning Her monitor was reset to  the correct daytime time She can switch Novolog to Humalog since her insurance will not cover Novolog now, should not make any difference in her level of control  Follow-up in 3 months again  NEUROPATHY: Symptomatically better and she can take gabapentin as needed only  HYPOTHYROIDISM: Her TSH is normal with 75 g and she will continue the same dose  Patient Instructions  Humolag pen for balance of clicks in am  Protein in am daily  Counseling time on subjects discussed above is over 50% of today's 25 minute visit   Shanah Guimaraes 12/22/2015, 2:22 PM   Note: This office note was prepared with Estate agent. Any transcriptional errors that result from this process are unintentional.                     Patient ID: [name], female   DOB: [birth year], 75 y.o.   MRN: [id number]    Reason for Appointment: Followup for Type 2 Diabetes and swelling of legs  History of Present Illness:          Diagnosis: Type 2 diabetes mellitus, date of diagnosis:2011       Past history: Since she was apparently intolerant to metformin she was treated with Onglyza until about 2014  At that time because of increasing A1c of about 8% she was started on Levemir 15 units and this was aggressively increased Onglyza was continued She has not tried any other treatments for diabetes Her A1c has been 10-10.5 in 2015 In 3/15 she was told to start small doses of NovoLog at lunch and supper and add 15 units of Levemir at night also To control  hyperglycemia with her basal bolus insulin regimen she was given Victoza in addition on her initial consultation in 5/15  Recent history:   INSULIN regimen :  V-go pump 40 unit basal, boluses at meal times: 6-6-8 units  She was changed on 09/19/14 to the V-go pump instead of basal bolus insulin shots She has been on Victoza since 8/15  and she is taking 1.2 mg With the V-go pump her blood sugars have improved overall and are less variable  Current blood sugar patterns and problems:  Fasting blood sugars are very consistent, usually just above 100  She has stopped checking her blood sugars later in the day even though she was told to vary the times when she checks her sugar  She has no difficulty remembering to do her boluses with meals but usually does not change the amounts  She has improved her diet and is trying to  eliminate high glycemic index foods and juices  Recently trying to walk more regularly also  No hypoglycemia  She has no difficulty with using the V-go pump, doing the boluses when she is eating out  and changing it every morning    Oral hypoglycemic drugs the patient is taking are: None    Side effects from medications have been: Metformin: rash  Glucose monitoring:  done 1 time a day         Glucometer:  One Touch.      Blood Glucose readings from home glucose meter download:  FASTING readings recently: 90-147, usually under 130.  Her monitor has incorrect date and time programmed   Glycemic control:   A1c was 7  from PCP in 8/16  Lab Results  Component Value Date   HGBA1C 7.6 (H) 12/19/2015   HGBA1C 8.1 (H) 10/31/2015   HGBA1C 7.2 (H) 07/31/2015   Lab Results  Component Value Date   MICROALBUR 0.8 09/19/2014   LDLCALC 101 08/21/2014   CREATININE 1.00 12/19/2015    Self-care: The diet that the patient has been following is: tries to limit fats     Meals: 3 meals per day. Bfst oatmeal or egg/toast at 8-9 am, dinner 5 pm cutting back on frequent meals and  portions; no hs snacks           Exercise: Walks 15-20 min       Dietician visit: Most recent: 2014 ( class).               Compliance with the medical regimen: Good  Retinal exam: Most recent:.9/14    Weight history:  Wt Readings from Last 3 Encounters:  12/22/15 227 lb (103 kg)  11/25/15 228 lb (103.4 kg)  11/05/15 227  lb 6.4 oz (103.1 kg)      Medication List       Accurate as of 12/22/15  2:22 PM. Always use your most recent med list.          aspirin 325 MG tablet Take 325 mg by mouth daily with breakfast.   betamethasone dipropionate 0.05 % cream Commonly known as:  DIPROLENE Apply 1 application topically 2 (two) times daily.   bisoprolol 10 MG tablet Commonly known as:  ZEBETA Take 20 mg by mouth daily.   canagliflozin 300 MG Tabs tablet Commonly known as:  INVOKANA Take 1 tablet (300 mg total) by mouth daily before breakfast.   carbamazepine 200 MG 12 hr capsule Commonly known as:  CARBATROL Take 200 mg by mouth 2 (two) times daily.   clobetasol 0.05 % Gel Commonly known as:  TEMOVATE Apply 1 application topically 2 (two) times daily.   diltiazem 300 MG 24 hr capsule Commonly known as:  CARDIZEM CD Take 300 mg by mouth daily.   donepezil 10 MG tablet Commonly known as:  ARICEPT Take 10 mg by mouth daily with supper.   esomeprazole 40 MG capsule Commonly known as:  NEXIUM Take 40 mg by mouth daily with supper.   folic acid 1 MG tablet Commonly known as:  FOLVITE Take 1 mg by mouth daily.   gabapentin 300 MG capsule Commonly known as:  NEURONTIN Take 300 mg by mouth 2 (two) times daily.   glucose blood test strip Commonly known as:  ONETOUCH VERIO Use to test blood sugar 3 times daily   insulin lispro 100 UNIT/ML injection Commonly known as:  HUMALOG Inject 0.78 mLs (78 Units total) into the skin once. With V-go pump   Insulin Pen Needle 32G X 4 MM Misc Use 8 pen needles per day   levothyroxine 75 MCG tablet Commonly known as:  SYNTHROID, LEVOTHROID Take 75 mcg by mouth daily.   liraglutide  18 MG/3ML Sopn Commonly known as:  VICTOZA INJECT (1.8 MG) DAILY AS DIRECTED   methotrexate 2.5 MG tablet Commonly known as:  RHEUMATREX Take 2.5 mg by mouth once a week. Take 4 every Monday per patient   nitroGLYCERIN 0.4 MG SL tablet Commonly known as:   NITROSTAT Place 0.4 mg under the tongue every 5 (five) minutes as needed for chest pain.   OLANZapine 5 MG tablet Commonly known as:  ZYPREXA Take 5 mg by mouth at bedtime.   olmesartan 40 MG tablet Commonly known as:  BENICAR Take 1 tablet (40 mg total) by mouth daily.   ondansetron 8 MG tablet Commonly known as:  ZOFRAN Take 1 tablet (8 mg total) by mouth every 8 (eight) hours as needed for nausea.   ONETOUCH DELICA LANCETS 16X Misc Use to check blood sugar 5 times per day dx code E11.65   solifenacin 5 MG tablet Commonly known as:  VESICARE Take 5 mg by mouth daily with breakfast.   spironolactone 50 MG tablet Commonly known as:  ALDACTONE Take 1 tablet (50 mg total) by mouth daily.   sucralfate 1 g tablet Commonly known as:  CARAFATE Take 1 g by mouth 4 (four) times daily -  with meals and at bedtime.   V-GO 40 Kit USE ONCE DAILY AS DIRECTED   Vitamin D 2000 units tablet Take 2,000 Units by mouth daily with breakfast.   ZENPEP 25000 units Cpep Generic drug:  Pancrelipase (Lip-Prot-Amyl) Take 25,000 tablets by mouth See admin instructions. Reported on 08/04/2015       Allergies:  Allergies  Allergen Reactions  . Flagyl [Metronidazole Hcl] Itching and Rash  . Ciprofloxacin Itching and Rash  . Metformin And Related Rash  . Milk-Related Compounds Other (See Comments)    Stomach pains  . Other Other (See Comments)    [place] nuts cause severe facial redness    Past Medical History:  Diagnosis Date  . Depression    Bipolar  . Diabetes mellitus    diet controlled  . GERD (gastroesophageal reflux disease)   . Hyperlipidemia   . Hypertension   . Ischemic colitis, enteritis, or enterocolitis ([place])   . Sleep apnea   . Stroke ([place])    x's 2  . Thyroid disease   . Urinary bladder incontinence   . Vertigo     Past Surgical History:  Procedure Laterality Date  . ABDOMINAL HYSTERECTOMY     partial   . APPENDECTOMY    . CARDIAC CATHETERIZATION N/A  12/24/2014   Procedure: Right/Left Heart Cath and Coronary Angiography;  Surgeon: Sanda Klein, MD;  Location: [facility];  Service: Cardiovascular;  Laterality: N/A;  . CHOLECYSTECTOMY    . FRACTURE SURGERY    . HEMORRHOID SURGERY    . RETINAL DETACHMENT SURGERY    . TONSILLECTOMY      Family History  Problem Relation Age of Onset  . Heart disease Mother   . Stroke Father   . Cancer Sister   . Cancer Brother   . Parkinson's disease Father     Social History:  reports that she has never smoked. She has never used smokeless tobacco. She reports that she does not drink alcohol or use drugs.    Review of Systems      The blood pressure has been controlled with a combination of Bisoprolol, diltiazem and Benicar,  as well as Aldactone.  Also on Invokana 300 mg now   Lab Results  Component Value Date  CREATININE 1.00 12/19/2015   BUN 15 12/19/2015   NA 140 12/19/2015   K 4.6 12/19/2015   CL 104 12/19/2015   CO2 28 12/19/2015     NEUROPATHY: She has lower leg pain and takes gabapentin twice a day for this with relief      Lipids: Last LDL from PCP was 92,Treatment regimen followed by PCP         Lab Results  Component Value Date   CHOL 153 04/26/2014   HDL 55.60 04/26/2014   LDLCALC 101 08/21/2014   TRIG 113.0 04/26/2014   CHOLHDL 3 04/26/2014       Thyroid:    Has had presumed hypothyroidism for over 20 years, has not had a change in medications in several years and last TSH was :   Lab Results  Component Value Date   TSH 1.32 12/19/2015   TSH 2.39 01/27/2015   TSH 1.90 08/21/2014   FREET4 0.86 01/31/2014   FREET4 0.91 08/28/2013     She is being treated for lymphedema and is wearing support stockings   Physical Examination:  BP 120/68 (BP Location: Left Arm, Patient Position: Sitting, Cuff Size: Large)   Pulse 76   Temp 97.7 F (36.5 C) (Oral)   Wt 227 lb (103 kg)   BMI 40.21 kg/m   Ankles look mildly swollen especially  left  ASSESSMENT/PLAN:   Diabetes type 2, uncontrolled   See history of present illness for detailed discussion of current  management, blood sugar patterns and problems identified.  Recent A1c still high at 7.6  Her blood sugars are looking better overall This may be from restarting Victoza as well as increasing Invokana Now tolerating 1.2 mg Victoza, probably had nausea with 1.8 mg However has not lost any weight Fasting readings are mildly increased recently but not consistent She does have relatively large need for bolus insulin and is not getting adequate coverage when she runs out of clicks on her pump  Recommended the following:  Continue the V-go pump with 40 unit basal  No change in Invokana or Victoza  She will need to use Humalog pen to inject extra boluses for her breakfast meal when she is running short of boluses on the pumps  She should continue to change the pump before lunch  More consistent diet  She will add protein to breakfast and reduce portions of cereal and oatmeal in the morning  More postprandial readings, higher date and time reprogrammed on her monitor now  Discussed targets at various times  Follow-up in 3 months unless she has difficulties with control   Hypertension: Blood pressure is normal, improved with increasing Invokana She will also follow-up with PCP next month Currently not showing any renal dysfunction with Invokana 300 mg  Patient Instructions  Humolag pen for balance of clicks in am  Protein in am daily   Counseling time on subjects discussed above is over 50% of today's 25 minute visit   Tata Timmins 12/22/2015, 2:22 PM   Note: This office note was prepared with Estate agent. Any transcriptional errors that result from this process are unintentional.

## 2016-02-01 ENCOUNTER — Other Ambulatory Visit: Payer: Self-pay | Admitting: Endocrinology

## 2016-02-09 ENCOUNTER — Other Ambulatory Visit: Payer: Self-pay | Admitting: Endocrinology

## 2016-02-28 ENCOUNTER — Other Ambulatory Visit: Payer: Self-pay | Admitting: Endocrinology

## 2016-03-19 ENCOUNTER — Other Ambulatory Visit (INDEPENDENT_AMBULATORY_CARE_PROVIDER_SITE_OTHER): Payer: Medicare Other

## 2016-03-19 DIAGNOSIS — E1165 Type 2 diabetes mellitus with hyperglycemia: Secondary | ICD-10-CM

## 2016-03-19 DIAGNOSIS — Z79899 Other long term (current) drug therapy: Secondary | ICD-10-CM

## 2016-03-19 DIAGNOSIS — Z794 Long term (current) use of insulin: Secondary | ICD-10-CM | POA: Diagnosis not present

## 2016-03-19 LAB — CBC WITH DIFFERENTIAL/PLATELET
BASOS PCT: 0.3 % (ref 0.0–3.0)
Basophils Absolute: 0 10*3/uL (ref 0.0–0.1)
EOS PCT: 0.5 % (ref 0.0–5.0)
Eosinophils Absolute: 0.1 10*3/uL (ref 0.0–0.7)
HEMATOCRIT: 39.3 % (ref 36.0–46.0)
HEMOGLOBIN: 12.4 g/dL (ref 12.0–15.0)
LYMPHS PCT: 13.7 % (ref 12.0–46.0)
Lymphs Abs: 1.8 10*3/uL (ref 0.7–4.0)
MCHC: 31.5 g/dL (ref 30.0–36.0)
MCV: 78.5 fl (ref 78.0–100.0)
MONOS PCT: 8.7 % (ref 3.0–12.0)
Monocytes Absolute: 1.1 10*3/uL — ABNORMAL HIGH (ref 0.1–1.0)
Neutro Abs: 10.1 10*3/uL — ABNORMAL HIGH (ref 1.4–7.7)
Neutrophils Relative %: 76.8 % (ref 43.0–77.0)
Platelets: 406 10*3/uL — ABNORMAL HIGH (ref 150.0–400.0)
RBC: 5.01 Mil/uL (ref 3.87–5.11)
RDW: 16.3 % — AB (ref 11.5–15.5)
WBC: 13.1 10*3/uL — AB (ref 4.0–10.5)

## 2016-03-19 LAB — COMPREHENSIVE METABOLIC PANEL
ALBUMIN: 4 g/dL (ref 3.5–5.2)
ALT: 24 U/L (ref 0–35)
AST: 20 U/L (ref 0–37)
Alkaline Phosphatase: 82 U/L (ref 39–117)
BUN: 15 mg/dL (ref 6–23)
CALCIUM: 9.5 mg/dL (ref 8.4–10.5)
CHLORIDE: 102 meq/L (ref 96–112)
CO2: 28 meq/L (ref 19–32)
CREATININE: 0.88 mg/dL (ref 0.40–1.20)
GFR: 66.6 mL/min (ref >60.00)
Glucose, Bld: 158 mg/dL — ABNORMAL HIGH (ref 70–99)
POTASSIUM: 4.2 meq/L (ref 3.5–5.1)
Sodium: 138 mEq/L (ref 135–145)
Total Bilirubin: 0.3 mg/dL (ref 0.2–1.2)
Total Protein: 7 g/dL (ref 6.0–8.3)

## 2016-03-19 LAB — HEMOGLOBIN A1C: Hgb A1c MFr Bld: 8.1 % — ABNORMAL HIGH (ref 4.6–6.5)

## 2016-03-22 ENCOUNTER — Encounter: Payer: Self-pay | Admitting: Endocrinology

## 2016-03-22 ENCOUNTER — Ambulatory Visit (INDEPENDENT_AMBULATORY_CARE_PROVIDER_SITE_OTHER): Payer: Medicare Other | Admitting: Endocrinology

## 2016-03-22 VITALS — BP 128/82 | HR 71 | Ht 63.0 in | Wt 232.0 lb

## 2016-03-22 DIAGNOSIS — E1165 Type 2 diabetes mellitus with hyperglycemia: Secondary | ICD-10-CM

## 2016-03-22 DIAGNOSIS — Z794 Long term (current) use of insulin: Secondary | ICD-10-CM | POA: Diagnosis not present

## 2016-03-22 DIAGNOSIS — E1140 Type 2 diabetes mellitus with diabetic neuropathy, unspecified: Secondary | ICD-10-CM

## 2016-03-22 LAB — MICROALBUMIN / CREATININE URINE RATIO
Creatinine,U: 72.9 mg/dL
Microalb Creat Ratio: 1 mg/g (ref 0.0–30.0)
Microalb, Ur: <0.7 mg/dL (ref 0.0–1.9)

## 2016-03-22 NOTE — Patient Instructions (Addendum)
Change pump daily at same time  Try to skip am Gabapentin  More sugars after supper  Walk daily

## 2016-03-22 NOTE — Addendum Note (Signed)
Addended by: Adline MangoSTONE-ELMORE, Jehieli Brassell I on: 03/22/2016 02:57 PM   Modules accepted: Orders

## 2016-03-22 NOTE — Progress Notes (Signed)
Patient ID: [name], female   DOB: 1940-02-04, 76 y.o.   MRN: [id number]    Reason for Appointment: Followup for Type 2 Diabetes    History of Present Illness:          Diagnosis: Type 2 diabetes mellitus, date of diagnosis:2011       Past history: Since she was apparently intolerant to metformin she was treated with Onglyza until about 2014  At that time because of increasing A1c of about 8% she was started on Levemir 15 units and this was aggressively increased Onglyza was continued She has not tried any other treatments for diabetes Her A1c has been 10-10.5 in 2015 In 3/15 she was told to start small doses of NovoLog at lunch and supper and add 15 units of Levemir at night also To control  hyperglycemia with her basal bolus insulin regimen she was given Victoza in addition on her initial consultation in 5/15 She has been on Victoza since 8/15   Recent history:   INSULIN regimen :  V-go pump 40 unit basal, boluses at meal times: 12-30-14 units  Noninsulin hypoglycemic drugs the patient is taking are: Invokana 300 mg, on VICTOZA 1.2 mg  She was changed on 09/19/14 to the V-go pump instead of basal bolus insulin shots  Her A1c recently was 8.1, previously 7.6  Current management, blood sugar patterns and problems:  She has not been able to give up with her day-to-day management her diabetes with infrequent blood sugar monitoring; also some of her readings in the download of her not to have the right date or time.  Despite instructions on day-to-day management of pump including changing the pump every day at the same time she is still waiting till the pump runs out of insulin to change it  She is also getting forgetful and does not always bolus when she is eating  She has also gained 5 pounds from lack of activity  She however things that she is usually trying to remember doing Victoza and her oral medications   Meals: 3 meals per day. Bfst oatmeal or egg or  cereal at 8 am, dinner 5 pm.; no hs snacks   Side effects from medications have been: Metformin: rash  Glucose monitoring:  done 1 time a day or less         Glucometer:  One Touch.      Blood Glucose readings from home glucose meter download:   Mean values apply above for all meters except median for One Touch  PRE-MEAL Fasting Lunch Dinner Bedtime Overall  Glucose range: 139-183  132-2 69  88-216     Mean/median: 176  142    150     Glycemic control:    Lab Results  Component Value Date   HGBA1C 8.1 (H) 03/19/2016   HGBA1C 7.6 (H) 12/19/2015   HGBA1C 8.1 (H) 10/31/2015   Lab Results  Component Value Date   MICROALBUR 0.8 09/19/2014   LDLCALC 101 08/21/2014   CREATININE 0.88 03/19/2016    Self-care: The diet that the patient has been following is: tries to limit fats            Exercise: Walks 20 min 1/7, Less with cold weather        Dietician visit: Most recent: 2014 ( class).   CDE visit: 9/16     Compliance with the medical regimen: Good   Weight history:  Wt Readings from Last 3 Encounters:  03/22/16 232  lb (105.2 kg)  12/22/15 227 lb (103 kg)  11/25/15 228 lb (103.4 kg)    Other active problems: See review of systems  Allergies as of 03/22/2016      Reactions   Flagyl [metronidazole Hcl] Itching, Rash   Ciprofloxacin Itching, Rash   Metformin And Related Rash   Milk-related Compounds Other (See Comments)   Stomach pains   Other Other (See Comments)   [place] nuts cause severe facial redness      Medication List       Accurate as of 03/22/16 11:45 AM. Always use your most recent med list.          aspirin 325 MG tablet Take 325 mg by mouth daily with breakfast.   betamethasone dipropionate 0.05 % cream Commonly known as:  DIPROLENE Apply 1 application topically 2 (two) times daily.   bisoprolol 10 MG tablet Commonly known as:  ZEBETA Take 20 mg by mouth daily.   canagliflozin 300 MG Tabs tablet Commonly known as:  INVOKANA Take 1 tablet  (300 mg total) by mouth daily before breakfast.   carbamazepine 200 MG 12 hr capsule Commonly known as:  CARBATROL Take 200 mg by mouth 2 (two) times daily.   clobetasol 0.05 % Gel Commonly known as:  TEMOVATE Apply 1 application topically 2 (two) times daily.   diltiazem 300 MG 24 hr capsule Commonly known as:  CARDIZEM CD Take 300 mg by mouth daily.   donepezil 10 MG tablet Commonly known as:  ARICEPT Take 10 mg by mouth daily with supper.   esomeprazole 40 MG capsule Commonly known as:  NEXIUM Take 40 mg by mouth daily with supper.   folic acid 1 MG tablet Commonly known as:  FOLVITE Take 1 mg by mouth daily.   gabapentin 300 MG capsule Commonly known as:  NEURONTIN Take 300 mg by mouth 2 (two) times daily.   glucose blood test strip Commonly known as:  ONETOUCH VERIO Use to test blood sugar 3 times daily   HUMALOG 100 UNIT/ML injection Generic drug:  insulin lispro INJECT .78 ML (78 UNITS) INTO THE SKIN VIA V-GO PUMP AS DIRECTED   Insulin Pen Needle 32G X 4 MM Misc Use 8 pen needles per day   levothyroxine 75 MCG tablet Commonly known as:  SYNTHROID, LEVOTHROID Take 75 mcg by mouth daily.   liraglutide 18 MG/3ML Sopn Commonly known as:  VICTOZA INJECT (1.8 MG) DAILY AS DIRECTED   VICTOZA 18 MG/3ML Sopn Generic drug:  liraglutide INJECT 1.2MG DAILY AS DIRECTED.   methotrexate 2.5 MG tablet Commonly known as:  RHEUMATREX Take 2.5 mg by mouth once a week. Take 4 every Monday per patient   nitroGLYCERIN 0.4 MG SL tablet Commonly known as:  NITROSTAT Place 0.4 mg under the tongue every 5 (five) minutes as needed for chest pain.   OLANZapine 5 MG tablet Commonly known as:  ZYPREXA Take 5 mg by mouth at bedtime.   olmesartan 40 MG tablet Commonly known as:  BENICAR Take 1 tablet (40 mg total) by mouth daily.   ondansetron 8 MG tablet Commonly known as:  ZOFRAN Take 1 tablet (8 mg total) by mouth every 8 (eight) hours as needed for nausea.     ONETOUCH DELICA LANCETS 19J Misc Use to check blood sugar 5 times per day dx code E11.65   solifenacin 5 MG tablet Commonly known as:  VESICARE Take 5 mg by mouth daily with breakfast.   spironolactone 50 MG tablet Commonly known as:  ALDACTONE Take 1 tablet (50 mg total) by mouth daily.   sucralfate 1 g tablet Commonly known as:  CARAFATE Take 1 g by mouth 4 (four) times daily -  with meals and at bedtime.   V-GO 40 Kit USE ONCE DAILY AS DIRECTED   Vitamin D 2000 units tablet Take 2,000 Units by mouth daily with breakfast.   ZENPEP 25000 units Cpep Generic drug:  Pancrelipase (Lip-Prot-Amyl) Take 25,000 tablets by mouth See admin instructions. Reported on 08/04/2015       Allergies:  Allergies  Allergen Reactions  . Flagyl [Metronidazole Hcl] Itching and Rash  . Ciprofloxacin Itching and Rash  . Metformin And Related Rash  . Milk-Related Compounds Other (See Comments)    Stomach pains  . Other Other (See Comments)    [place] nuts cause severe facial redness    Past Medical History:  Diagnosis Date  . Depression    Bipolar  . Diabetes mellitus    diet controlled  . GERD (gastroesophageal reflux disease)   . Hyperlipidemia   . Hypertension   . Ischemic colitis, enteritis, or enterocolitis ([place])   . Sleep apnea   . Stroke ([place])    x's 2  . Thyroid disease   . Urinary bladder incontinence   . Vertigo     Past Surgical History:  Procedure Laterality Date  . ABDOMINAL HYSTERECTOMY     partial   . APPENDECTOMY    . CARDIAC CATHETERIZATION N/A 12/24/2014   Procedure: Right/Left Heart Cath and Coronary Angiography;  Surgeon: Sanda Klein, MD;  Location: [facility];  Service: Cardiovascular;  Laterality: N/A;  . CHOLECYSTECTOMY    . FRACTURE SURGERY    . HEMORRHOID SURGERY    . RETINAL DETACHMENT SURGERY    . TONSILLECTOMY      Family History  Problem Relation Age of Onset  . Heart disease Mother   . Stroke Father   . Cancer Sister   .  Cancer Brother   . Parkinson's disease Father     Social History:  reports that she has never smoked. She has never used smokeless tobacco. She reports that she does not drink alcohol or use drugs.    Review of Systems   She has had less edema and takes Lasix regularly now  NEUROPATHY: She has lower leg pain and takes gabapentin twice a day for this, however does not have any symptoms      Lipids: Last LDL from PCP was 101, followed by PCP also        Lab Results  Component Value Date   CHOL 153 04/26/2014   HDL 55.60 04/26/2014   LDLCALC 101 08/21/2014   TRIG 113.0 04/26/2014   CHOLHDL 3 04/26/2014       Thyroid:   Has had presumed hypothyroidism for over 20 years, has not had a change in medications in several years and last TSH was 1.9 in August from PCP   Lab Results  Component Value Date   TSH 1.32 12/19/2015   TSH 2.39 01/27/2015   TSH 1.90 08/21/2014   FREET4 0.86 01/31/2014   FREET4 0.91 08/28/2013       The blood pressure has been controlled with a combination of amlodipine and Benicar as well as hydralazine and bisoprolol  Blood pressure is 120s at home     Physical Examination:  BP 128/82   Pulse 71   Ht 5' 3"  (1.6 m)   Wt 232 lb (105.2 kg)   SpO2 93%  BMI 41.10 kg/m      ASSESSMENT/PLAN:   Diabetes type 2, uncontrolled   See history of present illness for detailed discussion of his current management, blood sugar patterns and problems identified Her blood sugars are excellent with the V-go pump and this has improved her compliance with mealtime insulin. Also has less variability with this A1c has come down further and is upper normal now at 6.2  She misunderstood and stopped checking her blood sugars nonfasting, discussed need for checking postprandial readings to help adjust her boluses and monitor effects of various foods. Discussed blood sugar targets May adjust her boluses by one click based on what she is eating  She can reduce  frequency of monitoring in the morning Her monitor was reset to  the correct daytime time She can switch Novolog to Humalog since her insurance will not cover Novolog now, should not make any difference in her level of control  Follow-up in 3 months again  NEUROPATHY: Symptomatically better and she can take gabapentin as needed only  HYPOTHYROIDISM: Her TSH is normal with 75 g and she will continue the same dose  Patient Instructions  Change pump daily at same time  Try to skip am Gabapentin  More sugars after supper  Walk daily  Counseling time on subjects discussed above is over 50% of today's 25 minute visit   Yaw Escoto 03/22/2016, 11:45 AM   Note: This office note was prepared with Insurance underwriter. Any transcriptional errors that result from this process are unintentional.                     Patient ID: [name], female   DOB: [birth year], 76 y.o.   MRN: [id number]    Reason for Appointment: Followup for Type 2 Diabetes and swelling of legs  History of Present Illness:          Diagnosis: Type 2 diabetes mellitus, date of diagnosis:2011       Past history: Since she was apparently intolerant to metformin she was treated with Onglyza until about 2014  At that time because of increasing A1c of about 8% she was started on Levemir 15 units and this was aggressively increased Onglyza was continued She has not tried any other treatments for diabetes Her A1c has been 10-10.5 in 2015 In 3/15 she was told to start small doses of NovoLog at lunch and supper and add 15 units of Levemir at night also To control  hyperglycemia with her basal bolus insulin regimen she was given Victoza in addition on her initial consultation in 5/15  Recent history:   INSULIN regimen :  V-go pump 40 unit basal, boluses at meal times: 6-6-8 units  She was changed on 09/19/14 to the V-go pump instead of basal bolus insulin shots She has been on Victoza  since 8/15 and she is taking 1.2 mg With the V-go pump her blood sugars have improved overall and are less variable  Current blood sugar patterns and problems:  Fasting blood sugars are very consistent, usually just above 100  She has stopped checking her blood sugars later in the day even though she was told to vary the times when she checks her sugar  She has no difficulty remembering to do her boluses with meals but usually does not change the amounts  She has improved her diet and is trying to  eliminate high glycemic index foods and juices  Recently trying to walk more regularly also  No hypoglycemia  She has no difficulty with using the V-go pump, doing the boluses when she is eating out and changing it every morning    Oral hypoglycemic drugs the patient is taking are: None    Side effects from medications have been: Metformin: rash  Glucose monitoring:  done 1 time a day         Glucometer:  One Touch.      Blood Glucose readings from home glucose meter download:  FASTING readings recently: 90-147, usually under 130.  Her monitor has incorrect date and time programmed   Glycemic control:   A1c was 7  from PCP in 8/16  Lab Results  Component Value Date   HGBA1C 8.1 (H) 03/19/2016   HGBA1C 7.6 (H) 12/19/2015   HGBA1C 8.1 (H) 10/31/2015   Lab Results  Component Value Date   MICROALBUR 0.8 09/19/2014   LDLCALC 101 08/21/2014   CREATININE 0.88 03/19/2016    Self-care: The diet that the patient has been following is: tries to limit fats     Meals: 3 meals per day. Bfst oatmeal or egg/toast at 8-9 am, dinner 5 pm cutting back on frequent meals and  portions; no hs snacks           Exercise: Walks 15-20 min       Dietician visit: Most recent: 2014 ( class).               Compliance with the medical regimen: Good  Retinal exam: Most recent:.9/14    Weight history:  Wt Readings from Last 3 Encounters:  03/22/16 232 lb (105.2 kg)  12/22/15 227 lb (103 kg)    11/25/15 228 lb (103.4 kg)    Allergies as of 03/22/2016      Reactions   Flagyl [metronidazole Hcl] Itching, Rash   Ciprofloxacin Itching, Rash   Metformin And Related Rash   Milk-related Compounds Other (See Comments)   Stomach pains   Other Other (See Comments)   [place] nuts cause severe facial redness      Medication List       Accurate as of 03/22/16 11:45 AM. Always use your most recent med list.          aspirin 325 MG tablet Take 325 mg by mouth daily with breakfast.   betamethasone dipropionate 0.05 % cream Commonly known as:  DIPROLENE Apply 1 application topically 2 (two) times daily.   bisoprolol 10 MG tablet Commonly known as:  ZEBETA Take 20 mg by mouth daily.   canagliflozin 300 MG Tabs tablet Commonly known as:  INVOKANA Take 1 tablet (300 mg total) by mouth daily before breakfast.   carbamazepine 200 MG 12 hr capsule Commonly known as:  CARBATROL Take 200 mg by mouth 2 (two) times daily.   clobetasol 0.05 % Gel Commonly known as:  TEMOVATE Apply 1 application topically 2 (two) times daily.   diltiazem 300 MG 24 hr capsule Commonly known as:  CARDIZEM CD Take 300 mg by mouth daily.   donepezil 10 MG tablet Commonly known as:  ARICEPT Take 10 mg by mouth daily with supper.   esomeprazole 40 MG capsule Commonly known as:  NEXIUM Take 40 mg by mouth daily with supper.   folic acid 1 MG tablet Commonly known as:  FOLVITE Take 1 mg by mouth daily.   gabapentin 300 MG capsule Commonly known as:  NEURONTIN Take 300 mg by mouth 2 (two) times daily.   glucose blood test strip Commonly known  asRoma Schanz Use to test blood sugar 3 times daily   HUMALOG 100 UNIT/ML injection Generic drug:  insulin lispro INJECT .78 ML (78 UNITS) INTO THE SKIN VIA V-GO PUMP AS DIRECTED   Insulin Pen Needle 32G X 4 MM Misc Use 8 pen needles per day   levothyroxine 75 MCG tablet Commonly known as:  SYNTHROID, LEVOTHROID Take 75 mcg by mouth daily.    liraglutide 18 MG/3ML Sopn Commonly known as:  VICTOZA INJECT (1.8 MG) DAILY AS DIRECTED   VICTOZA 18 MG/3ML Sopn Generic drug:  liraglutide INJECT 1.'2MG'$  DAILY AS DIRECTED.   methotrexate 2.5 MG tablet Commonly known as:  RHEUMATREX Take 2.5 mg by mouth once a week. Take 4 every Monday per patient   nitroGLYCERIN 0.4 MG SL tablet Commonly known as:  NITROSTAT Place 0.4 mg under the tongue every 5 (five) minutes as needed for chest pain.   OLANZapine 5 MG tablet Commonly known as:  ZYPREXA Take 5 mg by mouth at bedtime.   olmesartan 40 MG tablet Commonly known as:  BENICAR Take 1 tablet (40 mg total) by mouth daily.   ondansetron 8 MG tablet Commonly known as:  ZOFRAN Take 1 tablet (8 mg total) by mouth every 8 (eight) hours as needed for nausea.   ONETOUCH DELICA LANCETS 35H Misc Use to check blood sugar 5 times per day dx code E11.65   solifenacin 5 MG tablet Commonly known as:  VESICARE Take 5 mg by mouth daily with breakfast.   spironolactone 50 MG tablet Commonly known as:  ALDACTONE Take 1 tablet (50 mg total) by mouth daily.   sucralfate 1 g tablet Commonly known as:  CARAFATE Take 1 g by mouth 4 (four) times daily -  with meals and at bedtime.   V-GO 40 Kit USE ONCE DAILY AS DIRECTED   Vitamin D 2000 units tablet Take 2,000 Units by mouth daily with breakfast.   ZENPEP 25000 units Cpep Generic drug:  Pancrelipase (Lip-Prot-Amyl) Take 25,000 tablets by mouth See admin instructions. Reported on 08/04/2015       Allergies:  Allergies  Allergen Reactions  . Flagyl [Metronidazole Hcl] Itching and Rash  . Ciprofloxacin Itching and Rash  . Metformin And Related Rash  . Milk-Related Compounds Other (See Comments)    Stomach pains  . Other Other (See Comments)    [place] nuts cause severe facial redness    Past Medical History:  Diagnosis Date  . Depression    Bipolar  . Diabetes mellitus    diet controlled  . GERD (gastroesophageal reflux  disease)   . Hyperlipidemia   . Hypertension   . Ischemic colitis, enteritis, or enterocolitis ([place])   . Sleep apnea   . Stroke ([place])    x's 2  . Thyroid disease   . Urinary bladder incontinence   . Vertigo     Past Surgical History:  Procedure Laterality Date  . ABDOMINAL HYSTERECTOMY     partial   . APPENDECTOMY    . CARDIAC CATHETERIZATION N/A 12/24/2014   Procedure: Right/Left Heart Cath and Coronary Angiography;  Surgeon: Sanda Klein, MD;  Location: [facility];  Service: Cardiovascular;  Laterality: N/A;  . CHOLECYSTECTOMY    . FRACTURE SURGERY    . HEMORRHOID SURGERY    . RETINAL DETACHMENT SURGERY    . TONSILLECTOMY      Family History  Problem Relation Age of Onset  . Heart disease Mother   . Stroke Father   .  Cancer Sister   . Cancer Brother   . Parkinson's disease Father     Social History:  reports that she has never smoked. She has never used smokeless tobacco. She reports that she does not drink alcohol or use drugs.    Review of Systems      The blood pressure has been controlled with a combination of Bisoprolol, diltiazem and Benicar,  as well as Aldactone.  Also on Invokana 300 mg now   Lab Results  Component Value Date   CREATININE 0.88 03/19/2016   BUN 15 03/19/2016   NA 138 03/19/2016   K 4.2 03/19/2016   CL 102 03/19/2016   CO2 28 03/19/2016    No recent problems with swelling of her legs   NEUROPATHY: She has lower leg pain and takes gabapentin twice a day, however has not tried to leave this off to see if she needs it on a regular basis      Lipids: Last LDL from PCP was 92,Treatment regimen followed by PCP         Lab Results  Component Value Date   CHOL 153 04/26/2014   HDL 55.60 04/26/2014   LDLCALC 101 08/21/2014   TRIG 113.0 04/26/2014   CHOLHDL 3 04/26/2014       Thyroid:    Has had presumed hypothyroidism for over 20 years, has not had a change in medications in several years and last TSH was Normal  :   Lab Results  Component Value Date   TSH 1.32 12/19/2015   TSH 2.39 01/27/2015   TSH 1.90 08/21/2014   FREET4 0.86 01/31/2014   FREET4 0.91 08/28/2013     She is being treated for lymphedema and is wearing support stockings   Physical Examination:  BP 128/82   Pulse 71   Ht 5' 3"  (1.6 m)   Wt 232 lb (105.2 kg)   SpO2 93%   BMI 41.10 kg/m   Ankles look mildly swollen especially left  ASSESSMENT/PLAN:   Diabetes type 2, uncontrolled   See history of present illness for detailed discussion of current  management, blood sugar patterns and problems identified.  Recent A1c has gone up to 8.1, previously 7.6 This may be from being inconsistent with her changing the pump every 24 hours as well as periodically forgetting her boluses This is despite giving her written instructions and reminding her on the last visit She thinks she is compliant with Invokana and Victoza Also with having infrequent monitoring and no readings after supper difficult to know what her blood sugar patterns are and no changes can be made today  Recommended the following:  Continue the V-go pump with 40 unit basal but make sure she changes this every 24 hours, discussed that she does not get any basal insulin after 24 hours even if she has some insulin left in her pump  No change in Invokana or Victoza  She will need to start checking blood sugars very consistently at various times including after supper  Reminded her to use Humalog pen to inject extra boluses when she is out of clicks  Encouraged her to increase her walking for exercise as her weight is difficult to bring down  Discussed blood sugar targets at various times  Follow-up in 2 months to review her progress   Hypertension: Blood pressure is normal, to continue regimen unchanged  NEUROPATHY: She can try to take the gabapentin as needed and not needed in the mornings   Patient Instructions  Change pump daily at same  time  Try to skip am Gabapentin  More sugars after supper  Walk daily   Counseling time on subjects discussed above is over 50% of today's 25 minute visit   Nicolet Griffy 03/22/2016, 11:45 AM   Note: This office note was prepared with Estate agent. Any transcriptional errors that result from this process are unintentional.

## 2016-03-26 ENCOUNTER — Other Ambulatory Visit: Payer: Self-pay | Admitting: Endocrinology

## 2016-04-26 ENCOUNTER — Ambulatory Visit (INDEPENDENT_AMBULATORY_CARE_PROVIDER_SITE_OTHER): Payer: Medicare Other | Admitting: Physician Assistant

## 2016-04-26 ENCOUNTER — Encounter: Payer: Self-pay | Admitting: Physician Assistant

## 2016-04-26 VITALS — BP 130/68 | HR 75 | Ht 63.0 in | Wt 228.4 lb

## 2016-04-26 DIAGNOSIS — I10 Essential (primary) hypertension: Secondary | ICD-10-CM

## 2016-04-26 DIAGNOSIS — R600 Localized edema: Secondary | ICD-10-CM | POA: Diagnosis not present

## 2016-04-26 DIAGNOSIS — R0609 Other forms of dyspnea: Secondary | ICD-10-CM

## 2016-04-26 DIAGNOSIS — R0600 Dyspnea, unspecified: Secondary | ICD-10-CM

## 2016-04-26 MED ORDER — NITROGLYCERIN 0.4 MG SL SUBL: 0.4000 mg | SUBLINGUAL_TABLET | SUBLINGUAL | 3 refills | Status: DC | PRN

## 2016-04-26 NOTE — Progress Notes (Signed)
Cardiology Office Note   Date:  04/26/2016   ID:  [name], DOB Dec 12, 1940, MRN [id number]  PCP:  Merrilee Seashore, MD  Cardiologist:  Dr. Sallyanne Kuster, 08/04/2015 Rosaria Ferries, PA-C   Chief Complaint  Patient presents with  . 6 month visit    pt c/o swelling in bilateral lower extremities; no other Sx.    History of Present Illness: [name] is a 76 y.o. female with a history of DM, HTN, TIA, obesity, ischemic colitis, nl MV 2012  [name] presents for cardiology follow up.  She never gets chest pain. She has some problems with LE edema, mostly during the day. She has compression stockings but does not wear them because she cannot get them on and off.  Her diabetes has been hard to control. She is trying to watch what she eats. She has lost 4 lbs since her last office visit, she is happy about that.   She has nocturia, but no PND or orthopnea. She had Botox to her bladder and it helped a great deal.   She is not having any cardiac issues. She does not really want to take anything else for her edema, she is taking spironolactone. She has been on Lasix in the past, does not want to take it again. Since she feels her respiratory status is at baseline, she is content to continue current therapy.  She has not had any chest pain. She stays busy but does not exercise very much.   Past Medical History:  Diagnosis Date  . Depression    Bipolar  . Diabetes mellitus    diet controlled  . GERD (gastroesophageal reflux disease)   . Hyperlipidemia   . Hypertension   . Ischemic colitis, enteritis, or enterocolitis ([place])   . Sleep apnea   . Stroke ([place])    x's 2  . Thyroid disease   . Urinary bladder incontinence   . Vertigo     Past Surgical History:  Procedure Laterality Date  . ABDOMINAL HYSTERECTOMY     partial   . APPENDECTOMY    . CARDIAC CATHETERIZATION N/A 12/24/2014   Procedure: Right/Left Heart Cath and Coronary Angiography;  Surgeon:  Sanda Klein, MD;  Location: [facility];  Service: Cardiovascular;  Laterality: N/A;  . CHOLECYSTECTOMY    . FRACTURE SURGERY    . HEMORRHOID SURGERY    . RETINAL DETACHMENT SURGERY    . TONSILLECTOMY      Medication Sig  . aspirin 325 MG tablet Take 325 mg by mouth daily with breakfast.   . betamethasone dipropionate (DIPROLENE) 0.05 % cream Apply 1 application topically 2 (two) times daily.  . bisoprolol (ZEBETA) 10 MG tablet Take 20 mg by mouth daily.  . canagliflozin (INVOKANA) 300 MG TABS tablet Take 1 tablet (300 mg total) by mouth daily before breakfast.  . carbamazepine (CARBATROL) 200 MG 12 hr capsule Take 200 mg by mouth 2 (two) times daily.   . Cholecalciferol (VITAMIN D) 2000 UNITS tablet Take 2,000 Units by mouth daily with breakfast.   . clobetasol (TEMOVATE) 0.05 % GEL Apply 1 application topically 2 (two) times daily.  Marland Kitchen diltiazem (CARDIZEM CD) 300 MG 24 hr capsule Take 300 mg by mouth daily.  Marland Kitchen donepezil (ARICEPT) 10 MG tablet Take 10 mg by mouth daily with supper.   . esomeprazole (NEXIUM) 40 MG capsule Take 40 mg by mouth daily with supper.  . folic acid (FOLVITE) 1 MG tablet Take 1 mg by  mouth daily.  Marland Kitchen gabapentin (NEURONTIN) 300 MG capsule Take 300 mg by mouth 2 (two) times daily.  Marland Kitchen glucose blood (ONETOUCH VERIO) test strip Use to test blood sugar 3 times daily  . HUMALOG 100 UNIT/ML injection INJECT .78 ML (78 UNITS) INTO THE SKIN VIA V-GO PUMP AS DIRECTED  . Insulin Disposable Pump (V-GO 40) KIT USE ONCE DAILY AS DIRECTED  . Insulin Pen Needle 32G X 4 MM MISC Use 8 pen needles per day  . levothyroxine (SYNTHROID, LEVOTHROID) 75 MCG tablet Take 75 mcg by mouth daily.    . methotrexate (RHEUMATREX) 2.5 MG tablet Per pt: takes 3 tablets every Monday  . nitroGLYCERIN (NITROSTAT) 0.4 MG SL tablet Place 0.4 mg under the tongue every 5 (five) minutes as needed for chest pain.   Marland Kitchen OLANZapine (ZYPREXA) 5 MG tablet Take 5 mg by mouth at bedtime.  Marland Kitchen olmesartan  (BENICAR) 40 MG tablet Take 1 tablet (40 mg total) by mouth daily.  . ondansetron (ZOFRAN) 8 MG tablet Take 1 tablet (8 mg total) by mouth every 8 (eight) hours as needed for nausea.  Glory Rosebush DELICA LANCETS 58K MISC USE TO CHECK BLOOD SUGAR 5 TIMES DAILY  . Pancrelipase, Lip-Prot-Amyl, (ZENPEP) 25000 UNITS CPEP Take 25,000 tablets by mouth See admin instructions. Reported on 08/04/2015  . spironolactone (ALDACTONE) 50 MG tablet Take 1 tablet (50 mg total) by mouth daily.  Marland Kitchen VICTOZA 18 MG/3ML SOPN INJECT 1.2MG DAILY AS DIRECTED.   No current facility-administered medications for this visit.     Allergies:   Flagyl [metronidazole hcl]; Ciprofloxacin; Metformin and related; Milk-related compounds; and Other    Social History:  The patient  reports that she has never smoked. She has never used smokeless tobacco. She reports that she does not drink alcohol or use drugs.   Family History:  The patient's family history includes Cancer in her brother and sister; Heart disease in her mother; Parkinson's disease in her father; Stroke in her father.    ROS:  Please see the history of present illness. All other systems are reviewed and negative.    PHYSICAL EXAM: VS:  BP 130/68 (BP Location: Left Arm, Patient Position: Sitting, Cuff Size: Normal)   Pulse 75   Ht _0  (1.6 m)   Wt 228 lb 6.4 oz (103.6 kg)   BMI 40.46 kg/m  , BMI Body mass index is 40.46 kg/m. GEN: Well nourished, well developed, female in no acute distress  HEENT: normal for age  Neck: no JVD, no carotid bruit, no masses Cardiac: RRR; no murmur, no rubs, or gallops Respiratory:  clear to auscultation bilaterally, normal work of breathing GI: soft, nontender, nondistended, + BS MS: no deformity or atrophy; Trace edema; distal pulses are 2+ in all 4 extremities   Skin: warm and dry, no rash Neuro:  Strength and sensation are intact Psych: euthymic mood, full affect   EKG:  EKG is not ordered today.   Recent  Labs: 12/19/2015: TSH 1.32 03/19/2016: ALT 24; BUN 15; Creatinine, Ser 0.88; Hemoglobin 12.4; Platelets 406.0; Potassium 4.2; Sodium 138    Lipid Panel    Component Value Date/Time   CHOL 153 04/26/2014 0923   TRIG 113.0 04/26/2014 0923   HDL 55.60 04/26/2014 0923   CHOLHDL 3 04/26/2014 0923   VLDL 22.6 04/26/2014 0923   LDLCALC 101 08/21/2014     Wt Readings from Last 3 Encounters:  04/26/16 228 lb 6.4 oz (103.6 kg)  03/22/16 232 lb (105.2 kg)  12/22/15  227 lb (103 kg)     Other studies Reviewed: Additional studies/ records that were reviewed today include: office notes, hospital records and testing.  ASSESSMENT AND PLAN:  1.  Lower extremity edema: Her edema is bilateral and has not changed recently. Continue current therapy, no changes.  2. Hypertension: Her blood pressure is right at goal. Her weight is trending down slightly and she understands that if she can lose some weight, her blood pressure may improve. As her weight is trending down slightly, she feels that she can work on this gradually.  3. Exertional dyspnea: Her weight is at baseline and her dyspnea on exertion is at baseline as well. Continue current therapy  Current medicines are reviewed at length with the patient today.  The patient does not have concerns regarding medicines.  The following changes have been made:  no change  Labs/ tests ordered today include:  No orders of the defined types were placed in this encounter.    Disposition:   FU with Dr. Sallyanne Kuster  Signed, Lenoard Aden  04/26/2016 3:40 PM    [place] Phone: [phone]; Fax: [phone]  This note was written with the assistance of speech recognition software. Please excuse any transcriptional errors.

## 2016-04-26 NOTE — Patient Instructions (Signed)
Medication Instructions:  Your physician recommends that you continue on your current medications as directed. Please refer to the Current Medication list given to you today.  If you need a refill on your cardiac medications before your next appointment, please call your pharmacy.   Follow-Up: Your physician wants you to follow-up in: IN 12 MONTHS April 2019 WITH DR CROITORU. You will receive a reminder letter in the mail two months in advance. If you don't receive a letter, please call our office February 2019 to schedule the April 2019 follow-up appointment.     Thank you for choosing [facility] at [facility]!!    RHONDA BARRETT, Franchot Gallo, LPN

## 2016-05-01 ENCOUNTER — Other Ambulatory Visit: Payer: Self-pay | Admitting: Endocrinology

## 2016-05-06 ENCOUNTER — Other Ambulatory Visit: Payer: Self-pay | Admitting: Endocrinology

## 2016-05-21 ENCOUNTER — Ambulatory Visit (INDEPENDENT_AMBULATORY_CARE_PROVIDER_SITE_OTHER): Payer: Medicare Other | Admitting: Endocrinology

## 2016-05-21 ENCOUNTER — Encounter: Payer: Self-pay | Admitting: Endocrinology

## 2016-05-21 VITALS — BP 138/84 | HR 68 | Ht 63.0 in | Wt 229.2 lb

## 2016-05-21 DIAGNOSIS — E1165 Type 2 diabetes mellitus with hyperglycemia: Secondary | ICD-10-CM

## 2016-05-21 DIAGNOSIS — Z794 Long term (current) use of insulin: Secondary | ICD-10-CM

## 2016-05-21 NOTE — Progress Notes (Signed)
Patient ID: [name], female   DOB: [birth year], 76 y.o.   MRN: [id number]    Reason for Appointment: Followup for Type 2 Diabetes    History of Present Illness:          Diagnosis: Type 2 diabetes mellitus, date of diagnosis:2011       Past history: Since she was apparently intolerant to metformin she was treated with Onglyza until about 2014  At that time because of increasing A1c of about 8% she was started on Levemir 15 units and this was aggressively increased Onglyza was continued She has not tried any other treatments for diabetes Her A1c has been 10-10.5 in 2015 In 3/15 she was told to start small doses of NovoLog at lunch and supper and add 15 units of Levemir at night also To control  hyperglycemia with her basal bolus insulin regimen she was given Victoza in addition on her initial consultation in 5/15 She has been on Victoza since 8/15   Recent history:   INSULIN regimen :  V-go pump 40 unit basal, boluses at meal times: 10-28-14 units  Noninsulin hypoglycemic drugs the patient is taking are: Invokana 300 mg, on VICTOZA 1.2 mg  She was changed on 09/19/14 to the V-go pump instead of basal bolus insulin shots  Her A1c recently was 8.1 in 3/18  Current management, blood sugar patterns and problems:  She has started changing her V-go pump in the evenings before supper and previously was waiting till the pump was completely out of insulin  With this her fasting readings are much improved although not consistent  She is however checking blood sugars very sporadically and mostly when she wakes up for around lunchtime  Blood sugars are periodically higher after breakfast when she is eating mostly cereal without any protein although she tries to have eggs on some days  She nausea that she is eating more at lunch and at suppertime but this still taking the higher dose of boluses at suppertime  Did not see any clearly high readings after lunch or supper with  has only a couple of readings  No hypoglycemia with lowest blood sugar 92  She may be watching portions better as her weight is coming back down  She is usually trying to remember doing Victoza and her oral medications   Meals: 3 meals per day. Bfst oatmeal or egg or cereal at 9 am, dinner 6-7 pm.; no hs snacks   Side effects from medications have been: Metformin: rash  Glucose monitoring:  done 1 time a day or less         Glucometer:  One Touch.      Blood Glucose readings from home glucose meter download:   Mean values apply above for all meters except median for One Touch  PRE-MEAL Fasting Lunch Dinner Bedtime Overall  Glucose range:  92-1 61    121, 142    Mean/median:     135    POST-MEAL PC Breakfast PC Lunch PC Dinner  Glucose range: 139-205  129, 164  95, 110   Mean/median:        Glycemic control:    Lab Results  Component Value Date   HGBA1C 8.1 (H) 03/19/2016   HGBA1C 7.6 (H) 12/19/2015   HGBA1C 8.1 (H) 10/31/2015   Lab Results  Component Value Date   MICROALBUR <0.7 03/22/2016   LDLCALC 101 08/21/2014   CREATININE 0.88 03/19/2016    Self-care: The diet that the patient  has been following is: tries to limit fats            Exercise: Walks 20 min 1/7, Less with cold weather        Dietician visit: Most recent: 2014 ( class).   CDE visit: 9/16     Compliance with the medical regimen: Good   Weight history:  Wt Readings from Last 3 Encounters:  05/21/16 229 lb 3.2 oz (104 kg)  04/26/16 228 lb 6.4 oz (103.6 kg)  03/22/16 232 lb (105.2 kg)    Other active problems: See review of systems  Allergies as of 05/21/2016      Reactions   Flagyl [metronidazole Hcl] Itching, Rash   Ciprofloxacin Itching, Rash   Metformin And Related Rash   Milk-related Compounds Other (See Comments)   Stomach pains   Other Other (See Comments)   [place] nuts cause severe facial redness      Medication List       Accurate as of 05/21/16 12:22 PM. Always use your most  recent med list.          aspirin 325 MG tablet Take 325 mg by mouth daily with breakfast.   betamethasone dipropionate 0.05 % cream Commonly known as:  DIPROLENE Apply 1 application topically 2 (two) times daily.   bisoprolol 10 MG tablet Commonly known as:  ZEBETA Take 20 mg by mouth daily.   canagliflozin 300 MG Tabs tablet Commonly known as:  INVOKANA Take 1 tablet (300 mg total) by mouth daily before breakfast.   carbamazepine 200 MG 12 hr capsule Commonly known as:  CARBATROL Take 200 mg by mouth 2 (two) times daily.   clobetasol 0.05 % Gel Commonly known as:  TEMOVATE Apply 1 application topically 2 (two) times daily.   diltiazem 300 MG 24 hr capsule Commonly known as:  CARDIZEM CD Take 300 mg by mouth daily.   donepezil 10 MG tablet Commonly known as:  ARICEPT Take 10 mg by mouth daily with supper.   esomeprazole 40 MG capsule Commonly known as:  NEXIUM Take 40 mg by mouth daily with supper.   folic acid 1 MG tablet Commonly known as:  FOLVITE Take 1 mg by mouth daily.   gabapentin 300 MG capsule Commonly known as:  NEURONTIN Take 300 mg by mouth 2 (two) times daily.   glucose blood test strip Commonly known as:  ONETOUCH VERIO Use to test blood sugar 3 times daily   HUMALOG 100 UNIT/ML injection Generic drug:  insulin lispro INJECT .78 ML (78 UNITS) INTO THE SKIN VIA V-GO PUMP AS DIRECTED   Insulin Pen Needle 32G X 4 MM Misc Use 8 pen needles per day   levothyroxine 75 MCG tablet Commonly known as:  SYNTHROID, LEVOTHROID Take 75 mcg by mouth daily.   methotrexate 2.5 MG tablet Commonly known as:  RHEUMATREX Per pt: takes 3 tablets every Monday   nitroGLYCERIN 0.4 MG SL tablet Commonly known as:  NITROSTAT Place 1 tablet (0.4 mg total) under the tongue every 5 (five) minutes as needed for chest pain.   OLANZapine 5 MG tablet Commonly known as:  ZYPREXA Take 5 mg by mouth at bedtime.   olmesartan 40 MG tablet Commonly known as:   BENICAR Take 1 tablet (40 mg total) by mouth daily.   ondansetron 8 MG tablet Commonly known as:  ZOFRAN Take 1 tablet (8 mg total) by mouth every 8 (eight) hours as needed for nausea.   ONETOUCH DELICA LANCETS 82X Misc USE TO  CHECK BLOOD SUGAR 5 TIMES DAILY   spironolactone 50 MG tablet Commonly known as:  ALDACTONE Take 1 tablet (50 mg total) by mouth daily.   V-GO 40 Kit USE ONCE DAILY AS DIRECTED   VICTOZA 18 MG/3ML Sopn Generic drug:  liraglutide DIAL AND INJECT SUBCUTANEOUSLY 1.2MG DAILY   Vitamin D 2000 units tablet Take 2,000 Units by mouth daily with breakfast.       Allergies:  Allergies  Allergen Reactions  . Flagyl [Metronidazole Hcl] Itching and Rash  . Ciprofloxacin Itching and Rash  . Metformin And Related Rash  . Milk-Related Compounds Other (See Comments)    Stomach pains  . Other Other (See Comments)    [place] nuts cause severe facial redness    Past Medical History:  Diagnosis Date  . Depression    Bipolar  . Diabetes mellitus    diet controlled  . GERD (gastroesophageal reflux disease)   . Hyperlipidemia   . Hypertension   . Ischemic colitis, enteritis, or enterocolitis ([place])   . Sleep apnea   . Stroke ([place])    x's 2  . Thyroid disease   . Urinary bladder incontinence   . Vertigo     Past Surgical History:  Procedure Laterality Date  . ABDOMINAL HYSTERECTOMY     partial   . APPENDECTOMY    . CARDIAC CATHETERIZATION N/A 12/24/2014   Procedure: Right/Left Heart Cath and Coronary Angiography;  Surgeon: Sanda Klein, MD;  Location: [facility];  Service: Cardiovascular;  Laterality: N/A;  . CHOLECYSTECTOMY    . FRACTURE SURGERY    . HEMORRHOID SURGERY    . RETINAL DETACHMENT SURGERY    . TONSILLECTOMY      Family History  Problem Relation Age of Onset  . Heart disease Mother   . Stroke Father   . Cancer Sister   . Cancer Brother   . Parkinson's disease Father     Social History:  reports that she has never smoked.  She has never used smokeless tobacco. She reports that she does not drink alcohol or use drugs.    Review of Systems   She has had less edema and takes Lasix regularly now  NEUROPATHY: She has lower leg pain and takes gabapentin twice a day for this, however does not have any symptoms      Lipids: Last LDL from PCP was 101, followed by PCP also        Lab Results  Component Value Date   CHOL 153 04/26/2014   HDL 55.60 04/26/2014   LDLCALC 101 08/21/2014   TRIG 113.0 04/26/2014   CHOLHDL 3 04/26/2014       Thyroid:   Has had presumed hypothyroidism for over 20 years, has not had a change in medications in several years and last TSH was 1.9 in August from PCP   Lab Results  Component Value Date   TSH 1.32 12/19/2015   TSH 2.39 01/27/2015   TSH 1.90 08/21/2014   FREET4 0.86 01/31/2014   FREET4 0.91 08/28/2013       The blood pressure has been controlled with a combination of amlodipine and Benicar as well as hydralazine and bisoprolol      Physical Examination:  BP 138/84   Pulse 68   Ht 5' 3"  (1.6 m)   Wt 229 lb 3.2 oz (104 kg)   SpO2 93%   BMI 40.60 kg/m      ASSESSMENT/PLAN:   Diabetes type 2, uncontrolled   See history  of present illness for detailed discussion of his current management, blood sugar patterns and problems identified  Her blood sugars are Looking better now compared to her last visit This has improved mostly with her trying to change her V-go pump consistently around the same time every evening However not checking enough readings after meals and has only sporadic readings lately Blood sugars maybe higher with eating cereal in the morning and discussed balancing all meals with some protein and not excessive carbohydrate  Other recommendations: She needs to check more readings after supper Also since she thinks she is eating more at lunch she can take about 12 units of cover her lunch and also reduce suppertime dose down to 10-12 units based  on her meal size since her readings at night are about 100 She will let us know if she has more variability in her blood sugars but for now will continue the same regimen Also she can try and adjust her doses further with better postprandial monitoring  Follow-up in 3 months again   Patient Instructions  Bfst 5 or 6 in am 6 at lunch and 5 or 6 at supper  Check blood sugars on waking up  3x weekly  Also check blood sugars about 2 hours after a meal and do this after different meals by rotation  Recommended blood sugar levels on waking up is 90-130 and about 2 hours after meal is 130-160  Please bring your blood sugar monitor to each visit, thank you     [facility] 05/21/2016, 12:22 PM   Note: This office note was prepared with Dragon voice recognition system technology. Any transcriptional errors that result from this process are unintentional.                     Patient ID: [name], female   DOB: 13-Dec-1940, 76 y.o.   MRN: [id number]    Reason for Appointment: Followup for Type 2 Diabetes and swelling of legs  History of Present Illness:          Diagnosis: Type 2 diabetes mellitus, date of diagnosis:2011       Past history: Since she was apparently intolerant to metformin she was treated with Onglyza until about 2014  At that time because of increasing A1c of about 8% she was started on Levemir 15 units and this was aggressively increased Onglyza was continued She has not tried any other treatments for diabetes Her A1c has been 10-10.5 in 2015 In 3/15 she was told to start small doses of NovoLog at lunch and supper and add 15 units of Levemir at night also To control  hyperglycemia with her basal bolus insulin regimen she was given Victoza in addition on her initial consultation in 5/15  Recent history:   INSULIN regimen :  V-go pump 40 unit basal, boluses at meal times: 6-6-8 units  She was changed on 09/19/14 to the V-go pump instead of basal bolus  insulin shots She has been on Victoza since 8/15 and she is taking 1.2 mg With the V-go pump her blood sugars have improved overall and are less variable  Current blood sugar patterns and problems:  Fasting blood sugars are very consistent, usually just above 100  She has stopped checking her blood sugars later in the day even though she was told to vary the times when she checks her sugar  She has no difficulty remembering to do her boluses with meals but usually does not change the amounts  She has improved her diet and is trying to  eliminate high glycemic index foods and juices  Recently trying to walk more regularly also  No hypoglycemia  She has no difficulty with using the V-go pump, doing the boluses when she is eating out and changing it every morning    Oral hypoglycemic drugs the patient is taking are: None    Side effects from medications have been: Metformin: rash  Glucose monitoring:  done 1 time a day         Glucometer:  One Touch.      Blood Glucose readings from home glucose meter download:  FASTING readings recently: 90-147, usually under 130.  Her monitor has incorrect date and time programmed   Glycemic control:   A1c was 7  from PCP in 8/16  Lab Results  Component Value Date   HGBA1C 8.1 (H) 03/19/2016   HGBA1C 7.6 (H) 12/19/2015   HGBA1C 8.1 (H) 10/31/2015   Lab Results  Component Value Date   MICROALBUR <0.7 03/22/2016   LDLCALC 101 08/21/2014   CREATININE 0.88 03/19/2016    Self-care: The diet that the patient has been following is: tries to limit fats     Meals: 3 meals per day. Bfst oatmeal or egg/toast at 8-9 am, dinner 5 pm cutting back on frequent meals and  portions; no hs snacks           Exercise: Walks 15-20 min       Dietician visit: Most recent: 2014 ( class).               Compliance with the medical regimen: Good  Retinal exam: Most recent:.9/14    Weight history:  Wt Readings from Last 3 Encounters:  05/21/16 229 lb 3.2  oz (104 kg)  04/26/16 228 lb 6.4 oz (103.6 kg)  03/22/16 232 lb (105.2 kg)    Allergies as of 05/21/2016      Reactions   Flagyl [metronidazole Hcl] Itching, Rash   Ciprofloxacin Itching, Rash   Metformin And Related Rash   Milk-related Compounds Other (See Comments)   Stomach pains   Other Other (See Comments)   [place] nuts cause severe facial redness      Medication List       Accurate as of 05/21/16 12:22 PM. Always use your most recent med list.          aspirin 325 MG tablet Take 325 mg by mouth daily with breakfast.   betamethasone dipropionate 0.05 % cream Commonly known as:  DIPROLENE Apply 1 application topically 2 (two) times daily.   bisoprolol 10 MG tablet Commonly known as:  ZEBETA Take 20 mg by mouth daily.   canagliflozin 300 MG Tabs tablet Commonly known as:  INVOKANA Take 1 tablet (300 mg total) by mouth daily before breakfast.   carbamazepine 200 MG 12 hr capsule Commonly known as:  CARBATROL Take 200 mg by mouth 2 (two) times daily.   clobetasol 0.05 % Gel Commonly known as:  TEMOVATE Apply 1 application topically 2 (two) times daily.   diltiazem 300 MG 24 hr capsule Commonly known as:  CARDIZEM CD Take 300 mg by mouth daily.   donepezil 10 MG tablet Commonly known as:  ARICEPT Take 10 mg by mouth daily with supper.   esomeprazole 40 MG capsule Commonly known as:  NEXIUM Take 40 mg by mouth daily with supper.   folic acid 1 MG tablet Commonly known as:  FOLVITE Take 1 mg by mouth  daily.   gabapentin 300 MG capsule Commonly known as:  NEURONTIN Take 300 mg by mouth 2 (two) times daily.   glucose blood test strip Commonly known as:  ONETOUCH VERIO Use to test blood sugar 3 times daily   HUMALOG 100 UNIT/ML injection Generic drug:  insulin lispro INJECT .78 ML (78 UNITS) INTO THE SKIN VIA V-GO PUMP AS DIRECTED   Insulin Pen Needle 32G X 4 MM Misc Use 8 pen needles per day   levothyroxine 75 MCG tablet Commonly known as:   SYNTHROID, LEVOTHROID Take 75 mcg by mouth daily.   methotrexate 2.5 MG tablet Commonly known as:  RHEUMATREX Per pt: takes 3 tablets every Monday   nitroGLYCERIN 0.4 MG SL tablet Commonly known as:  NITROSTAT Place 1 tablet (0.4 mg total) under the tongue every 5 (five) minutes as needed for chest pain.   OLANZapine 5 MG tablet Commonly known as:  ZYPREXA Take 5 mg by mouth at bedtime.   olmesartan 40 MG tablet Commonly known as:  BENICAR Take 1 tablet (40 mg total) by mouth daily.   ondansetron 8 MG tablet Commonly known as:  ZOFRAN Take 1 tablet (8 mg total) by mouth every 8 (eight) hours as needed for nausea.   ONETOUCH DELICA LANCETS 99M Misc USE TO CHECK BLOOD SUGAR 5 TIMES DAILY   spironolactone 50 MG tablet Commonly known as:  ALDACTONE Take 1 tablet (50 mg total) by mouth daily.   V-GO 40 Kit USE ONCE DAILY AS DIRECTED   VICTOZA 18 MG/3ML Sopn Generic drug:  liraglutide DIAL AND INJECT SUBCUTANEOUSLY 1.2MG DAILY   Vitamin D 2000 units tablet Take 2,000 Units by mouth daily with breakfast.       Allergies:  Allergies  Allergen Reactions  . Flagyl [Metronidazole Hcl] Itching and Rash  . Ciprofloxacin Itching and Rash  . Metformin And Related Rash  . Milk-Related Compounds Other (See Comments)    Stomach pains  . Other Other (See Comments)    [place] nuts cause severe facial redness    Past Medical History:  Diagnosis Date  . Depression    Bipolar  . Diabetes mellitus    diet controlled  . GERD (gastroesophageal reflux disease)   . Hyperlipidemia   . Hypertension   . Ischemic colitis, enteritis, or enterocolitis ([place])   . Sleep apnea   . Stroke ([place])    x's 2  . Thyroid disease   . Urinary bladder incontinence   . Vertigo     Past Surgical History:  Procedure Laterality Date  . ABDOMINAL HYSTERECTOMY     partial   . APPENDECTOMY    . CARDIAC CATHETERIZATION N/A 12/24/2014   Procedure: Right/Left Heart Cath and Coronary Angiography;   Surgeon: Sanda Klein, MD;  Location: [facility];  Service: Cardiovascular;  Laterality: N/A;  . CHOLECYSTECTOMY    . FRACTURE SURGERY    . HEMORRHOID SURGERY    . RETINAL DETACHMENT SURGERY    . TONSILLECTOMY      Family History  Problem Relation Age of Onset  . Heart disease Mother   . Stroke Father   . Cancer Sister   . Cancer Brother   . Parkinson's disease Father     Social History:  reports that she has never smoked. She has never used smokeless tobacco. She reports that she does not drink alcohol or use drugs.    Review of Systems      The blood pressure has been controlled with a combination  of Bisoprolol, diltiazem and Benicar,  as well as Aldactone.  Also on Invokana 300 mg now   Lab Results  Component Value Date   CREATININE 0.88 03/19/2016   BUN 15 03/19/2016   NA 138 03/19/2016   K 4.2 03/19/2016   CL 102 03/19/2016   CO2 28 03/19/2016    No recent problems with swelling of her legs   NEUROPATHY: She has lower leg pain and takes gabapentin twice a day, however has not tried to leave this off to see if she needs it on a regular basis      Lipids: Last LDL from PCP was 92,Treatment regimen followed by PCP         Lab Results  Component Value Date   CHOL 153 04/26/2014   HDL 55.60 04/26/2014   LDLCALC 101 08/21/2014   TRIG 113.0 04/26/2014   CHOLHDL 3 04/26/2014       Thyroid:    Has had presumed hypothyroidism for over 20 years, has not had a change in medications in several years and last TSH was Normal :   Lab Results  Component Value Date   TSH 1.32 12/19/2015   TSH 2.39 01/27/2015   TSH 1.90 08/21/2014   FREET4 0.86 01/31/2014   FREET4 0.91 08/28/2013     She is being treated for lymphedema and is wearing support stockings   Physical Examination:  BP 138/84   Pulse 68   Ht 5' 3"  (1.6 m)   Wt 229 lb 3.2 oz (104 kg)   SpO2 93%   BMI 40.60 kg/m   Ankles look mildly swollen especially left  ASSESSMENT/PLAN:    Diabetes type 2, uncontrolled   See history of present illness for detailed discussion of current  management, blood sugar patterns and problems identified.  Recent A1c has gone up to 8.1, previously 7.6 This may be from being inconsistent with her changing the pump every 24 hours as well as periodically forgetting her boluses This is despite giving her written instructions and reminding her on the last visit She thinks she is compliant with Invokana and Victoza Also with having infrequent monitoring and no readings after supper difficult to know what her blood sugar patterns are and no changes can be made today  Recommended the following:  Continue the V-go pump with 40 unit basal but make sure she changes this every 24 hours, discussed that she does not get any basal insulin after 24 hours even if she has some insulin left in her pump  No change in Invokana or Victoza  She will need to start checking blood sugars very consistently at various times including after supper  Reminded her to use Humalog pen to inject extra boluses when she is out of clicks  Encouraged her to increase her walking for exercise as her weight is difficult to bring down  Discussed blood sugar targets at various times  Follow-up in 2 months to review her progress   Hypertension: Blood pressure is normal, to continue regimen unchanged  NEUROPATHY: She can try to take the gabapentin as needed and not needed in the mornings   Patient Instructions  Bfst 5 or 6 in am 6 at lunch and 5 or 6 at supper  Check blood sugars on waking up  3x weekly  Also check blood sugars about 2 hours after a meal and do this after different meals by rotation  Recommended blood sugar levels on waking up is 90-130 and about 2 hours after meal  is 130-160  Please bring your blood sugar monitor to each visit, thank you    Counseling time on subjects discussed above is over 50% of today's 25 minute  visit   Henley Boettner 05/21/2016, 12:22 PM   Note: This office note was prepared with Estate agent. Any transcriptional errors that result from this process are unintentional.

## 2016-05-21 NOTE — Patient Instructions (Addendum)
Bfst 5 or 6 in am 6 at lunch and 5 or 6 at supper  Check blood sugars on waking up  3x weekly  Also check blood sugars about 2 hours after a meal and do this after different meals by rotation  Recommended blood sugar levels on waking up is 90-130 and about 2 hours after meal is 130-160  Please bring your blood sugar monitor to each visit, thank you

## 2016-05-26 ENCOUNTER — Other Ambulatory Visit: Payer: Self-pay | Admitting: Endocrinology

## 2016-06-03 ENCOUNTER — Other Ambulatory Visit: Payer: Self-pay | Admitting: Endocrinology

## 2016-06-18 ENCOUNTER — Other Ambulatory Visit: Payer: Self-pay | Admitting: Endocrinology

## 2016-06-22 ENCOUNTER — Other Ambulatory Visit: Payer: Self-pay | Admitting: Endocrinology

## 2016-07-14 ENCOUNTER — Telehealth: Payer: Self-pay | Admitting: Endocrinology

## 2016-07-14 NOTE — Telephone Encounter (Signed)
Called patient and let her know to stop the Invokana for one week and to call us back to let us know if she has had any improvement in her diarrhea.

## 2016-07-14 NOTE — Telephone Encounter (Signed)
She can stop it for 1 week and let us know what happens

## 2016-07-14 NOTE — Telephone Encounter (Signed)
Patient has questions. She states that her Dr. Pershing ProudMattox believes that she is having diarrhea because of the invokana. She would like to discuss what she should do. She is currently having diarrhea every 2-3 days. Please advise.

## 2016-07-14 NOTE — Telephone Encounter (Signed)
Called patient and she stated that the diarrhea had been going on for the same amount of time since she has been on the Invokana. She has had several visits to her GI doctor and her doctor recommended that this was a result of the Invokana. She is still taking 1 daily before breakfast on an empty stomach. Please advise.

## 2016-07-25 ENCOUNTER — Other Ambulatory Visit: Payer: Self-pay | Admitting: Endocrinology

## 2016-08-17 ENCOUNTER — Other Ambulatory Visit (INDEPENDENT_AMBULATORY_CARE_PROVIDER_SITE_OTHER): Payer: Medicare Other

## 2016-08-17 DIAGNOSIS — E1165 Type 2 diabetes mellitus with hyperglycemia: Secondary | ICD-10-CM

## 2016-08-17 DIAGNOSIS — Z794 Long term (current) use of insulin: Secondary | ICD-10-CM | POA: Diagnosis not present

## 2016-08-17 LAB — BASIC METABOLIC PANEL
BUN: 10 mg/dL (ref 6–23)
CHLORIDE: 97 meq/L (ref 96–112)
CO2: 29 meq/L (ref 19–32)
CREATININE: 0.77 mg/dL (ref 0.40–1.20)
Calcium: 9.1 mg/dL (ref 8.4–10.5)
GFR: 77.61 mL/min (ref >60.00)
Glucose, Bld: 165 mg/dL — ABNORMAL HIGH (ref 70–99)
Potassium: 4.9 mEq/L (ref 3.5–5.1)
Sodium: 133 mEq/L — ABNORMAL LOW (ref 135–145)

## 2016-08-17 LAB — LIPID PANEL
CHOL/HDL RATIO: 3
Cholesterol: 175 mg/dL (ref 0–200)
HDL: 58 mg/dL (ref >39.00)
LDL CALC: 88 mg/dL (ref 0–99)
NonHDL: 116.5
Triglycerides: 142 mg/dL (ref 0.0–149.0)
VLDL: 28.4 mg/dL (ref 0.0–40.0)

## 2016-08-17 LAB — HEMOGLOBIN A1C: Hgb A1c MFr Bld: 7.8 % — ABNORMAL HIGH (ref 4.6–6.5)

## 2016-08-18 LAB — FRUCTOSAMINE: Fructosamine: 252 umol/L (ref 0–285)

## 2016-08-20 ENCOUNTER — Other Ambulatory Visit: Payer: Self-pay

## 2016-08-20 ENCOUNTER — Other Ambulatory Visit: Payer: Self-pay | Admitting: Endocrinology

## 2016-08-20 ENCOUNTER — Ambulatory Visit (INDEPENDENT_AMBULATORY_CARE_PROVIDER_SITE_OTHER): Payer: Medicare Other | Admitting: Endocrinology

## 2016-08-20 VITALS — BP 128/82 | HR 81 | Ht 63.0 in | Wt 233.0 lb

## 2016-08-20 DIAGNOSIS — I10 Essential (primary) hypertension: Secondary | ICD-10-CM | POA: Diagnosis not present

## 2016-08-20 DIAGNOSIS — E1165 Type 2 diabetes mellitus with hyperglycemia: Secondary | ICD-10-CM

## 2016-08-20 DIAGNOSIS — K529 Noninfective gastroenteritis and colitis, unspecified: Secondary | ICD-10-CM

## 2016-08-20 DIAGNOSIS — Z794 Long term (current) use of insulin: Secondary | ICD-10-CM | POA: Diagnosis not present

## 2016-08-20 MED ORDER — "INSULIN SYRINGE-NEEDLE U-100 31G X 15/64"" 1 ML MISC": 4 refills | Status: DC

## 2016-08-20 NOTE — Progress Notes (Signed)
Patient ID: FIANA GLADU, female   DOB: December 24, 1940, 76 y.o.   MRN: [id number]    Reason for Appointment: Followup for Type 2 Diabetes    History of Present Illness:          Diagnosis: Type 2 diabetes mellitus, date of diagnosis:2011       Past history: Since she was apparently intolerant to metformin she was treated with Onglyza until about 2014  At that time because of increasing A1c of about 8% she was started on Levemir 15 units and this was aggressively increased Onglyza was continued She has not tried any other treatments for diabetes Her A1c has been 10-10.5 in 2015 In 3/15 she was told to start small doses of NovoLog at lunch and supper and add 15 units of Levemir at night also To control  hyperglycemia with her basal bolus insulin regimen she was given Victoza in addition on her initial consultation in 5/15 She has been on Victoza since 8/15   Recent history:   INSULIN regimen :  V-go pump 40 unit basal, boluses at meal times: 12-30-14 units  Noninsulin hypoglycemic drugs the patient is taking are: Invokana 300 mg, on VICTOZA 1.2 mg  She was changed on 09/19/14 to the V-go pump instead of basal bolus insulin shots  Her A1c recently was 7.8, previously 8.1 in 3/18  Current management, blood sugar patterns and problems:  She has overall still poor control with blood sugars periodically over 200  This is with monitoring blood sugars erratically and mostly late morning and early afternoon  She is taking relatively large amounts of boluses for her meals and some times will run out of bolus insulin in her pump  At times she will change her pump when she is out of boluses early  However FASTING readings are not consistently high with her 40 unit basal  However she is not very consistent with diet or balancing meals with protein  She will forget her Victoza at bedtime at times and she will follow asleep  Her weight has gone up since her last visit  She did  run out of [place] for 2 weeks and is back on it now, did not get a refill until recently but she still has readings near 200 in the last week   Meals: 3 meals per day. Bfst oatmeal or egg or cereal at 9 am, dinner 6-7 pm.; no hs snacks   Side effects from medications have been: Metformin: rash  Glucose monitoring:  done 1 time a day or less         Glucometer:  One Touch.      Blood Glucose readings from home glucose meter download:   Mean values apply above for all meters except median for One Touch  PRE-MEAL Fasting Lunch Dinner Bedtime Overall  Glucose range:  1 16-218  1 28-214   161, 240    Mean/median:     146   POST-MEAL PC Breakfast PC Lunch PC Dinner  Glucose range:  1 33-233    Mean/median:       Glycemic control:    Lab Results  Component Value Date   HGBA1C 7.8 (H) 08/17/2016   HGBA1C 8.1 (H) 03/19/2016   HGBA1C 7.6 (H) 12/19/2015   Lab Results  Component Value Date   MICROALBUR <0.7 03/22/2016   [place] 88 08/17/2016   CREATININE 0.77 08/17/2016    Self-care: The diet that the patient has been following is: tries to limit  fats            Exercise: Walks 20 min 1/7, Less with Hot weather or rain       Dietician visit: Most recent: 2014 ( class).   CDE visit: 9/16     Compliance with the medical regimen: Inconsistent   Weight history:  Wt Readings from Last 3 Encounters:  08/20/16 233 lb (105.7 kg)  05/21/16 229 lb 3.2 oz (104 kg)  04/26/16 228 lb 6.4 oz (103.6 kg)    Other active problems: See review of systems    Allergies as of 08/20/2016      Reactions   Flagyl [metronidazole Hcl] Itching, Rash   Ciprofloxacin Itching, Rash   Metformin And Related Rash   Milk-related Compounds Other (See Comments)   Stomach pains   Other Other (See Comments)   [place] nuts cause severe facial redness      Medication List       Accurate as of 08/20/16 11:59 PM. Always use your most recent med list.          aspirin 325 MG tablet Take 325 mg by  mouth daily with breakfast.   betamethasone dipropionate 0.05 % cream Commonly known as:  DIPROLENE Apply 1 application topically 2 (two) times daily.   bisoprolol 10 MG tablet Commonly known as:  ZEBETA Take 20 mg by mouth daily.   carbamazepine 200 MG 12 hr capsule Commonly known as:  CARBATROL Take 200 mg by mouth 2 (two) times daily.   clobetasol 0.05 % Gel Commonly known as:  TEMOVATE Apply 1 application topically 2 (two) times daily.   diltiazem 300 MG 24 hr capsule Commonly known as:  CARDIZEM CD Take 300 mg by mouth daily.   donepezil 10 MG tablet Commonly known as:  ARICEPT Take 10 mg by mouth daily with supper.   esomeprazole 40 MG capsule Commonly known as:  NEXIUM Take 40 mg by mouth daily with supper.   folic acid 1 MG tablet Commonly known as:  FOLVITE Take 1 mg by mouth daily.   gabapentin 300 MG capsule Commonly known as:  NEURONTIN Take 300 mg by mouth 2 (two) times daily.   glucose blood test strip Commonly known as:  ONETOUCH VERIO Use to test blood sugar 3 times daily   HUMALOG 100 UNIT/ML injection Generic drug:  insulin lispro INJECT .78 MLS (78 UNITS) INTO THE SKIN VIA V-GO PUMP AS DIRECTED   Insulin Pen Needle 32G X 4 MM Misc Use 8 pen needles per day   Insulin Syringe-Needle U-100 31G X 15/64" 1 ML Misc Commonly known as:  BD INSULIN SYRINGE SAFETYGLIDE Use 1 syringe to inject insulin into V-Go pump daily   INVOKANA 300 MG Tabs tablet Generic drug:  canagliflozin TAKE ONE TABLET BY MOUTH DAILY BEFORE BREAKFAST   levothyroxine 75 MCG tablet Commonly known as:  SYNTHROID, LEVOTHROID Take 75 mcg by mouth daily.   methotrexate 2.5 MG tablet Commonly known as:  RHEUMATREX Per pt: takes 3 tablets every Monday   nitroGLYCERIN 0.4 MG SL tablet Commonly known as:  NITROSTAT Place 1 tablet (0.4 mg total) under the tongue every 5 (five) minutes as needed for chest pain.   OLANZapine 5 MG tablet Commonly known as:  ZYPREXA Take 5 mg  by mouth at bedtime.   olmesartan 40 MG tablet Commonly known as:  BENICAR Take 1 tablet (40 mg total) by mouth daily.   ondansetron 8 MG tablet Commonly known as:  ZOFRAN Take 1 tablet (8  mg total) by mouth every 8 (eight) hours as needed for nausea.   ONETOUCH DELICA LANCETS 29U Misc USE TO CHECK BLOOD SUGAR 5 TIMES DAILY   spironolactone 50 MG tablet Commonly known as:  ALDACTONE Take 1 tablet (50 mg total) by mouth daily.   V-GO 40 Kit USE ONCE  DAILY AS DIRECTED   VICTOZA 18 MG/3ML Sopn Generic drug:  liraglutide DIAL AND INJECT SUBCUTANEOUSLY 1.2MG DAILY   Vitamin D 2000 units tablet Take 2,000 Units by mouth daily with breakfast.       Allergies:  Allergies  Allergen Reactions  . Flagyl [Metronidazole Hcl] Itching and Rash  . Ciprofloxacin Itching and Rash  . Metformin And Related Rash  . Milk-Related Compounds Other (See Comments)    Stomach pains  . Other Other (See Comments)    [place] nuts cause severe facial redness    Past Medical History:  Diagnosis Date  . Depression    Bipolar  . Diabetes mellitus    diet controlled  . GERD (gastroesophageal reflux disease)   . Hyperlipidemia   . Hypertension   . Ischemic colitis, enteritis, or enterocolitis ([place])   . Sleep apnea   . Stroke ([place])    x's 2  . Thyroid disease   . Urinary bladder incontinence   . Vertigo     Past Surgical History:  Procedure Laterality Date  . ABDOMINAL HYSTERECTOMY     partial   . APPENDECTOMY    . CARDIAC CATHETERIZATION N/A 12/24/2014   Procedure: Right/Left Heart Cath and Coronary Angiography;  Surgeon: Sanda Klein, MD;  Location: [facility];  Service: Cardiovascular;  Laterality: N/A;  . CHOLECYSTECTOMY    . FRACTURE SURGERY    . HEMORRHOID SURGERY    . RETINAL DETACHMENT SURGERY    . TONSILLECTOMY      Family History  Problem Relation Age of Onset  . Heart disease Mother   . Stroke Father   . Cancer Sister   . Cancer Brother   . Parkinson's  disease Father     Social History:  reports that she has never smoked. She has never used smokeless tobacco. She reports that she does not drink alcohol or use drugs.    Review of Systems   She has had less edema and takes Lasix regularly now  NEUROPATHY: She has lower leg pain and takes gabapentin twice a day for this, however does not have any symptoms      Lipids: Last LDL from PCP was 101, followed by PCP also        Lab Results  Component Value Date   CHOL 175 08/17/2016   HDL 58.00 08/17/2016   LDLCALC 88 08/17/2016   TRIG 142.0 08/17/2016   CHOLHDL 3 08/17/2016       Thyroid:   Has had presumed hypothyroidism for over 20 years, has not had a change in medications in several years and last TSH was 1.9 in August from PCP   Lab Results  Component Value Date   TSH 1.32 12/19/2015   TSH 2.39 01/27/2015   TSH 1.90 08/21/2014   FREET4 0.86 01/31/2014   FREET4 0.91 08/28/2013       The blood pressure has been controlled with a combination of amlodipine and Benicar as well as hydralazine and bisoprolol      Physical Examination:  BP 128/82   Pulse 81   Ht _0  (1.6 m)   Wt 233 lb (105.7 kg)   SpO2 94%  BMI 41.27 kg/m      ASSESSMENT/PLAN:   Diabetes type 2, uncontrolled   See history of present illness for detailed discussion of his current management, blood sugar patterns and problems identified  Her blood sugars are Looking better now compared to her last visit This has improved mostly with her trying to change her V-go pump consistently around the same time every evening However not checking enough readings after meals and has only sporadic readings lately Blood sugars maybe higher with eating cereal in the morning and discussed balancing all meals with some protein and not excessive carbohydrate  Other recommendations: She needs to check more readings after supper Also since she thinks she is eating more at lunch she can take about 12 units of cover  her lunch and also reduce suppertime dose down to 10-12 units based on her meal size since her readings at night are about 100 She will let us know if she has more variability in her blood sugars but for now will continue the same regimen Also she can try and adjust her doses further with better postprandial monitoring  Follow-up in 3 months again   Patient Instructions  Check blood sugars on waking up  3-4/7  Also check blood sugars about 2 hours after a meal and do this after different meals by rotation  Recommended blood sugar levels on waking up is 90-130 and about 2 hours after meal is 130-160  Please bring your blood sugar monitor to each visit, thank you  Try Clonidine for BP and diarrhea  Extra insulin with syringe    Geoge Lawrance 08/21/2016, 6:14 PM   Note: This office note was prepared with Dragon voice recognition system technology. Any transcriptional errors that result from this process are unintentional.                     Patient ID: Harriett Rush, female   DOB: 03/14/1940, 76 y.o.   MRN: [id number]    Reason for Appointment: Followup for Type 2 Diabetes and swelling of legs  History of Present Illness:          Diagnosis: Type 2 diabetes mellitus, date of diagnosis:2011       Past history: Since she was apparently intolerant to metformin she was treated with Onglyza until about 2014  At that time because of increasing A1c of about 8% she was started on Levemir 15 units and this was aggressively increased Onglyza was continued She has not tried any other treatments for diabetes Her A1c has been 10-10.5 in 2015 In 3/15 she was told to start small doses of NovoLog at lunch and supper and add 15 units of Levemir at night also To control  hyperglycemia with her basal bolus insulin regimen she was given Victoza in addition on her initial consultation in 5/15  Recent history:   INSULIN regimen :  V-go pump 40 unit basal, boluses at meal times: 6-6-8  units  She was changed on 09/19/14 to the V-go pump instead of basal bolus insulin shots She has been on Victoza since 8/15 and she is taking 1.2 mg With the V-go pump her blood sugars have improved overall and are less variable  Current blood sugar patterns and problems:  Fasting blood sugars are very consistent, usually just above 100  She has stopped checking her blood sugars later in the day even though she was told to vary the times when she checks her sugar  She has no difficulty remembering  to do her boluses with meals but usually does not change the amounts  She has improved her diet and is trying to  eliminate high glycemic index foods and juices  Recently trying to walk more regularly also  No hypoglycemia  She has no difficulty with using the V-go pump, doing the boluses when she is eating out and changing it every morning    Oral hypoglycemic drugs the patient is taking are: None    Side effects from medications have been: Metformin: rash  Glucose monitoring:  done 1 time a day         Glucometer:  One Touch.      Blood Glucose readings from home glucose meter download:  FASTING readings recently: 90-147, usually under 130.  Her monitor has incorrect date and time programmed   Glycemic control:   A1c was 7  from PCP in 8/16  Lab Results  Component Value Date   HGBA1C 7.8 (H) 08/17/2016   HGBA1C 8.1 (H) 03/19/2016   HGBA1C 7.6 (H) 12/19/2015   Lab Results  Component Value Date   MICROALBUR <0.7 03/22/2016   [place] 88 08/17/2016   CREATININE 0.77 08/17/2016    Self-care: The diet that the patient has been following is: tries to limit fats     Meals: 3 meals per day. Bfst oatmeal or egg/toast at 8-9 am, dinner 5 pm cutting back on frequent meals and  portions; no hs snacks           Exercise: Walks 15-20 min       Dietician visit: Most recent: 2014 ( class).               Compliance with the medical regimen: Good  Retinal exam: Most recent:.9/14     Weight history:  Wt Readings from Last 3 Encounters:  08/20/16 233 lb (105.7 kg)  05/21/16 229 lb 3.2 oz (104 kg)  04/26/16 228 lb 6.4 oz (103.6 kg)    Allergies as of 08/20/2016      Reactions   Flagyl [metronidazole Hcl] Itching, Rash   Ciprofloxacin Itching, Rash   Metformin And Related Rash   Milk-related Compounds Other (See Comments)   Stomach pains   Other Other (See Comments)   [place] nuts cause severe facial redness      Medication List       Accurate as of 08/20/16 11:59 PM. Always use your most recent med list.          aspirin 325 MG tablet Take 325 mg by mouth daily with breakfast.   betamethasone dipropionate 0.05 % cream Commonly known as:  DIPROLENE Apply 1 application topically 2 (two) times daily.   bisoprolol 10 MG tablet Commonly known as:  ZEBETA Take 20 mg by mouth daily.   carbamazepine 200 MG 12 hr capsule Commonly known as:  CARBATROL Take 200 mg by mouth 2 (two) times daily.   clobetasol 0.05 % Gel Commonly known as:  TEMOVATE Apply 1 application topically 2 (two) times daily.   diltiazem 300 MG 24 hr capsule Commonly known as:  CARDIZEM CD Take 300 mg by mouth daily.   donepezil 10 MG tablet Commonly known as:  ARICEPT Take 10 mg by mouth daily with supper.   esomeprazole 40 MG capsule Commonly known as:  NEXIUM Take 40 mg by mouth daily with supper.   folic acid 1 MG tablet Commonly known as:  FOLVITE Take 1 mg by mouth daily.   gabapentin 300 MG capsule Commonly known  as:  NEURONTIN Take 300 mg by mouth 2 (two) times daily.   glucose blood test strip Commonly known as:  ONETOUCH VERIO Use to test blood sugar 3 times daily   HUMALOG 100 UNIT/ML injection Generic drug:  insulin lispro INJECT .78 MLS (78 UNITS) INTO THE SKIN VIA V-GO PUMP AS DIRECTED   Insulin Pen Needle 32G X 4 MM Misc Use 8 pen needles per day   Insulin Syringe-Needle U-100 31G X 15/64" 1 ML Misc Commonly known as:  BD INSULIN SYRINGE  SAFETYGLIDE Use 1 syringe to inject insulin into V-Go pump daily   INVOKANA 300 MG Tabs tablet Generic drug:  canagliflozin TAKE ONE TABLET BY MOUTH DAILY BEFORE BREAKFAST   levothyroxine 75 MCG tablet Commonly known as:  SYNTHROID, LEVOTHROID Take 75 mcg by mouth daily.   methotrexate 2.5 MG tablet Commonly known as:  RHEUMATREX Per pt: takes 3 tablets every Monday   nitroGLYCERIN 0.4 MG SL tablet Commonly known as:  NITROSTAT Place 1 tablet (0.4 mg total) under the tongue every 5 (five) minutes as needed for chest pain.   OLANZapine 5 MG tablet Commonly known as:  ZYPREXA Take 5 mg by mouth at bedtime.   olmesartan 40 MG tablet Commonly known as:  BENICAR Take 1 tablet (40 mg total) by mouth daily.   ondansetron 8 MG tablet Commonly known as:  ZOFRAN Take 1 tablet (8 mg total) by mouth every 8 (eight) hours as needed for nausea.   ONETOUCH DELICA LANCETS 16X Misc USE TO CHECK BLOOD SUGAR 5 TIMES DAILY   spironolactone 50 MG tablet Commonly known as:  ALDACTONE Take 1 tablet (50 mg total) by mouth daily.   V-GO 40 Kit USE ONCE  DAILY AS DIRECTED   VICTOZA 18 MG/3ML Sopn Generic drug:  liraglutide DIAL AND INJECT SUBCUTANEOUSLY 1.2MG DAILY   Vitamin D 2000 units tablet Take 2,000 Units by mouth daily with breakfast.       Allergies:  Allergies  Allergen Reactions  . Flagyl [Metronidazole Hcl] Itching and Rash  . Ciprofloxacin Itching and Rash  . Metformin And Related Rash  . Milk-Related Compounds Other (See Comments)    Stomach pains  . Other Other (See Comments)    [place] nuts cause severe facial redness    Past Medical History:  Diagnosis Date  . Depression    Bipolar  . Diabetes mellitus    diet controlled  . GERD (gastroesophageal reflux disease)   . Hyperlipidemia   . Hypertension   . Ischemic colitis, enteritis, or enterocolitis ([place])   . Sleep apnea   . Stroke ([place])    x's 2  . Thyroid disease   . Urinary bladder incontinence   .  Vertigo     Past Surgical History:  Procedure Laterality Date  . ABDOMINAL HYSTERECTOMY     partial   . APPENDECTOMY    . CARDIAC CATHETERIZATION N/A 12/24/2014   Procedure: Right/Left Heart Cath and Coronary Angiography;  Surgeon: Sanda Klein, MD;  Location: [facility];  Service: Cardiovascular;  Laterality: N/A;  . CHOLECYSTECTOMY    . FRACTURE SURGERY    . HEMORRHOID SURGERY    . RETINAL DETACHMENT SURGERY    . TONSILLECTOMY      Family History  Problem Relation Age of Onset  . Heart disease Mother   . Stroke Father   . Cancer Sister   . Cancer Brother   . Parkinson's disease Father     Social History:  reports that she  has never smoked. She has never used smokeless tobacco. She reports that she does not drink alcohol or use drugs.    Review of Systems   DIARRHEA: She says she has had this for about 6 months and has had no relief with management by her gastroenterologist and he has been asking her to leave off 1 of her medications for 2 weeks at a time to see if it is iatrogenic She is symptomatic with this Currently off Benicar but her diarrhea still present     The blood pressure has been controlled with a combination of Bisoprolol, diltiazem and Benicar,  as well as Aldactone.  Also on Invokana 300 mg now   Lab Results  Component Value Date   CREATININE 0.77 08/17/2016   BUN 10 08/17/2016   NA 133 (L) 08/17/2016   K 4.9 08/17/2016   CL 97 08/17/2016   CO2 29 08/17/2016      NEUROPATHY: She has lower leg pain and takes gabapentin twice a day  LIPIDS: Appear controlled,Treatment regimen followed by PCP         Lab Results  Component Value Date   CHOL 175 08/17/2016   HDL 58.00 08/17/2016   LDLCALC 88 08/17/2016   TRIG 142.0 08/17/2016   CHOLHDL 3 08/17/2016       Thyroid:    Has had presumed hypothyroidism for over 20 years, has not had a change in medications in several years and last TSH was Normal :   Lab Results  Component Value  Date   TSH 1.32 12/19/2015   TSH 2.39 01/27/2015   TSH 1.90 08/21/2014   FREET4 0.86 01/31/2014   FREET4 0.91 08/28/2013       Physical Examination:  BP 128/82   Pulse 81   Ht _0  (1.6 m)   Wt 233 lb (105.7 kg)   SpO2 94%   BMI 41.27 kg/m    ASSESSMENT/PLAN:   Diabetes type 2, uncontrolled   See history of present illness for detailed discussion of current  management, blood sugar patterns and problems identified.  A1c is still not at target and overall around 8% the last few times  She is gaining weight and is insulin resistant, currently does not appear to be getting enough insulin especially at meal times She can do better with  reducing calories and balancing meals with protein consistently Also needs to exercise more regularly She tends to forget to monitor her sugars especially after supper With a running out of boluses on her pump she may not be getting consistent coverage for her evening meals  Recommended the following:   Start taking VICTOZA in the morning for better compliance  Start taking HUMALOG with a syringe when she is running out of boluses on the pump, she can make up the difference with an injection  Continue the V-go pump with 40 unit basal  with changing every 24 hours  Take Invokana regularly  More blood sugars after evening meal   Call if fasting blood sugars are consistently high  Walk more regularly   Hypertension: Blood pressure is near normal, with current regimen although has not taken Benicar and couple of days  DIARRHEA: Apparently etiology has not been established May have diabetic diarrhea specially with her history of neuropathy  Recommended that she may be tried on CLONIDINE patch if it is diabetic diarrhea with adjustment of her blood pressure medication, will defer to PCP   Patient Instructions  Check blood sugars on waking up  3-4/7  Also check blood sugars about 2 hours after a meal and do this after different  meals by rotation  Recommended blood sugar levels on waking up is 90-130 and about 2 hours after meal is 130-160  Please bring your blood sugar monitor to each visit, thank you  Try Clonidine for BP and diarrhea  Extra insulin with syringe   Counseling time on subjects discussed above is over 50% of today's 25 minute visit   Luismario Coston 08/21/2016, 6:14 PM   Note: This office note was prepared with Dragon voice recognition system technology. Any transcriptional errors that result from this process are unintentional.

## 2016-08-20 NOTE — Patient Instructions (Addendum)
Check blood sugars on waking up  3-4/7  Also check blood sugars about 2 hours after a meal and do this after different meals by rotation  Recommended blood sugar levels on waking up is 90-130 and about 2 hours after meal is 130-160  Please bring your blood sugar monitor to each visit, thank you  Try Clonidine for BP and diarrhea  Extra insulin with syringe

## 2016-08-27 ENCOUNTER — Other Ambulatory Visit: Payer: Self-pay

## 2016-08-27 MED ORDER — DULAGLUTIDE 1.5 MG/0.5ML ~~LOC~~ SOAJ: SUBCUTANEOUS | 3 refills | Status: DC

## 2016-08-30 ENCOUNTER — Telehealth: Payer: Self-pay | Admitting: Endocrinology

## 2016-08-30 NOTE — Telephone Encounter (Signed)
My note says that she can take Victoza more consistently in the mornings.  Please check with patient if she is doing this or prefers weekly medication like Trulicity

## 2016-08-30 NOTE — Telephone Encounter (Signed)
Patient called in reference to Dulaglutide (TRULICITY) 1.5 MG/0.5ML SOPN. Patient stated she does not recall talking to Dr. Lucianne MussKumar about this medication. Please call patient and advise. OK to leave message.

## 2016-08-30 NOTE — Telephone Encounter (Signed)
Please advise. I do not see Trulicity in the OV notes. I honestly do not remember why I sent this in for her? Please advise.

## 2016-08-30 NOTE — Telephone Encounter (Signed)
Called patient and she said she wanted to stay on Victoza and I let her know that it was a mistake to send her the Trulicity and apologized to her.

## 2016-09-02 ENCOUNTER — Other Ambulatory Visit: Payer: Self-pay | Admitting: Cardiovascular Disease

## 2016-09-07 ENCOUNTER — Other Ambulatory Visit: Payer: Self-pay | Admitting: Endocrinology

## 2016-09-27 ENCOUNTER — Other Ambulatory Visit: Payer: Self-pay | Admitting: Endocrinology

## 2016-10-19 ENCOUNTER — Other Ambulatory Visit: Payer: Self-pay | Admitting: Endocrinology

## 2016-11-18 ENCOUNTER — Other Ambulatory Visit (INDEPENDENT_AMBULATORY_CARE_PROVIDER_SITE_OTHER): Payer: Medicare Other

## 2016-11-18 DIAGNOSIS — Z794 Long term (current) use of insulin: Secondary | ICD-10-CM | POA: Diagnosis not present

## 2016-11-18 DIAGNOSIS — E1165 Type 2 diabetes mellitus with hyperglycemia: Secondary | ICD-10-CM

## 2016-11-18 LAB — BASIC METABOLIC PANEL
BUN: 13 mg/dL (ref 6–23)
CHLORIDE: 100 meq/L (ref 96–112)
CO2: 29 meq/L (ref 19–32)
CREATININE: 0.93 mg/dL (ref 0.40–1.20)
Calcium: 9.4 mg/dL (ref 8.4–10.5)
GFR: 62.37 mL/min (ref >60.00)
Glucose, Bld: 130 mg/dL — ABNORMAL HIGH (ref 70–99)
Potassium: 4.9 mEq/L (ref 3.5–5.1)
SODIUM: 134 meq/L — AB (ref 135–145)

## 2016-11-18 LAB — HEMOGLOBIN A1C: HEMOGLOBIN A1C: 7.7 % — AB (ref 4.6–6.5)

## 2016-11-21 ENCOUNTER — Other Ambulatory Visit: Payer: Self-pay | Admitting: Endocrinology

## 2016-11-22 ENCOUNTER — Ambulatory Visit: Payer: Medicare Other | Admitting: Endocrinology

## 2016-11-23 ENCOUNTER — Ambulatory Visit (INDEPENDENT_AMBULATORY_CARE_PROVIDER_SITE_OTHER): Payer: Medicare Other | Admitting: Endocrinology

## 2016-11-23 ENCOUNTER — Encounter: Payer: Self-pay | Admitting: Endocrinology

## 2016-11-23 VITALS — BP 124/68 | HR 78 | Ht 63.0 in | Wt 235.4 lb

## 2016-11-23 DIAGNOSIS — Z794 Long term (current) use of insulin: Secondary | ICD-10-CM

## 2016-11-23 DIAGNOSIS — E1165 Type 2 diabetes mellitus with hyperglycemia: Secondary | ICD-10-CM | POA: Diagnosis not present

## 2016-11-23 NOTE — Patient Instructions (Addendum)
Victoza 1.8mg  daily  Check blood sugars on waking up  4/7 days  Also check blood sugars about 2 hours after a meal and do this after different meals by rotation  Recommended blood sugar levels on waking up is 90-130 and about 2 hours after meal is 130-160  Please bring your blood sugar monitor to each visit, thank you  Diet consult  Cut milk in 1/2

## 2016-11-23 NOTE — Progress Notes (Signed)
Patient ID: LETHIA DONLON, female   DOB: [birth year], 76 y.o.   MRN: [id number]    Reason for Appointment: Followup for Type 2 Diabetes    History of Present Illness:          Diagnosis: Type 2 diabetes mellitus, date of diagnosis:2011       Past history: Since she was apparently intolerant to metformin she was treated with Onglyza until about 2014  At that time because of increasing A1c of about 8% she was started on Levemir 15 units and this was aggressively increased Onglyza was continued She has not tried any other treatments for diabetes Her A1c has been 10-10.5 in 2015 In 3/15 she was told to start small doses of NovoLog at lunch and supper and add 15 units of Levemir at night also To control  hyperglycemia with her basal bolus insulin regimen she was given Victoza in addition on her initial consultation in 5/15 She has been on Victoza since 8/15   Recent history:   INSULIN regimen :  V-go pump 40 unit basal, boluses at meal times: 12-30-14 units  Noninsulin hypoglycemic drugs the patient is taking are: Invokana 300 mg, on VICTOZA 1.2 mg  She was changed on 09/19/14 to the V-go pump instead of basal bolus insulin shots  Her A1c recently was 7.7, previously 8.1 in 3/18  Current management, blood sugar patterns and problems:  She has has not checked her blood sugars much recently and mostly in the mornings so before his first meal  She says that she forgets to check her blood sugars as directed  However she is able to change her V-go pump consistently at the same time  She is taking her Victoza in the morning more recently and is more regular with this, still taking 1.2 mg  Again she has periodic high readings even in the morning but does not think she is eating snacks during the night, mostly eating an apple or peanut butter crackers at bedtime  Her monitor has incorrect DATE or time and generating an error message now  Has only a couple of readings in the  after known and evening these are not consistently high  She is also trying to be very regular with her mealtime boluses  She now says that she gets hungry and shaky if she does not eat every 3-4 hours despite not having low blood sugars  Also she is drinking several glasses of skim milk a day since this helps her hunger  Her weight has gone up  This is despite her trying to do some walking at least 3 days a week   Meals: 3 meals per day. Bfst oatmeal or egg or cereal at 9 am, dinner 6-7 pm.; no hs snacks   Side effects from medications have been: Metformin: rash  Glucose monitoring:  done 1 time a day or less         Glucometer:  One Touch.      Blood Glucose readings from home glucose meter download:   Mean values apply above for all meters except median for One Touch  PRE-MEAL Fasting Lunch Dinner Bedtime Overall  Glucose range: 10 9-218       Mean/median: 159     154    POST-MEAL PC Breakfast PC Lunch PC Dinner  Glucose range:  198  153?    Mean/median:        Glycemic control:    Lab Results  Component Value Date  HGBA1C 7.7 (H) 11/18/2016   HGBA1C 7.8 (H) 08/17/2016   HGBA1C 8.1 (H) 03/19/2016   Lab Results  Component Value Date   MICROALBUR <0.7 03/22/2016   LDLCALC 88 08/17/2016   CREATININE 0.93 11/18/2016    Self-care: The diet that the patient has been following is: tries to limit fats   Breakfast: She will add [place] bacon or sausage for protein, usually getting eggs also           Exercise: Walks 20 min 3/7  days     Dietician visit: Most recent: 2014 ( class).   CDE visit: 9/16     Compliance with the medical regimen: Inconsistent   Weight history:  Wt Readings from Last 3 Encounters:  11/23/16 235 lb 6.4 oz (106.8 kg)  08/20/16 233 lb (105.7 kg)  05/21/16 229 lb 3.2 oz (104 kg)    Other active problems: See review of systems    Allergies as of 11/23/2016      Reactions   Flagyl [metronidazole Hcl] Itching, Rash   Ciprofloxacin  Itching, Rash   Metformin And Related Rash   Milk-related Compounds Other (See Comments)   Stomach pains   Other Other (See Comments)   [place] nuts cause severe facial redness      Medication List        Accurate as of 11/23/16  4:22 PM. Always use your most recent med list.          aspirin 325 MG tablet Take 325 mg by mouth daily with breakfast.   betamethasone dipropionate 0.05 % cream Commonly known as:  DIPROLENE Apply 1 application topically 2 (two) times daily.   carbamazepine 200 MG 12 hr capsule Commonly known as:  CARBATROL Take 200 mg by mouth 2 (two) times daily.   clobetasol 0.05 % Gel Commonly known as:  TEMOVATE Apply 1 application topically 2 (two) times daily.   diltiazem 300 MG 24 hr capsule Commonly known as:  CARDIZEM CD Take 300 mg by mouth daily.   donepezil 10 MG tablet Commonly known as:  ARICEPT Take 10 mg by mouth daily with supper.   Dulaglutide 1.5 MG/0.5ML Sopn Commonly known as:  TRULICITY Inject 1.5 mg weekly   esomeprazole 40 MG capsule Commonly known as:  NEXIUM Take 40 mg by mouth daily with supper.   folic acid 1 MG tablet Commonly known as:  FOLVITE Take 1 mg by mouth daily.   gabapentin 300 MG capsule Commonly known as:  NEURONTIN Take 300 mg by mouth 2 (two) times daily.   glucose blood test strip Commonly known as:  ONETOUCH VERIO Use to test blood sugar 3 times daily   HUMALOG 100 UNIT/ML injection Generic drug:  insulin lispro INJECT 0.78ML (78UNITS) INTO THE SKIN VIA V-GO PUMP AS DIRECTED   Insulin Pen Needle 32G X 4 MM Misc Use 8 pen needles per day   Insulin Syringe-Needle U-100 31G X 15/64" 1 ML Misc Commonly known as:  BD INSULIN SYRINGE SAFETYGLIDE Use 1 syringe to inject insulin into V-Go pump daily   INVOKANA 300 MG Tabs tablet Generic drug:  canagliflozin TAKE ONE TABLET BY MOUTH DAILY BEFORE BREAKFAST   levothyroxine 75 MCG tablet Commonly known as:  SYNTHROID, LEVOTHROID Take 75 mcg by mouth  daily.   methotrexate 2.5 MG tablet Commonly known as:  RHEUMATREX Per pt: takes 3 tablets every Monday   nitroGLYCERIN 0.4 MG SL tablet Commonly known as:  NITROSTAT Place 1 tablet (0.4 mg total) under the tongue  every 5 (five) minutes as needed for chest pain.   OLANZapine 5 MG tablet Commonly known as:  ZYPREXA Take 5 mg by mouth at bedtime.   olmesartan 40 MG tablet Commonly known as:  BENICAR Take 1 tablet (40 mg total) by mouth daily.   ondansetron 8 MG tablet Commonly known as:  ZOFRAN Take 1 tablet (8 mg total) by mouth every 8 (eight) hours as needed for nausea.   ONETOUCH DELICA LANCETS 57S Misc USE LANCET TO TEST FIVE TIMES A DAY   spironolactone 50 MG tablet Commonly known as:  ALDACTONE TAKE ONE TABLET BY MOUTH DAILY   V-GO 40 Kit USE ONCE  DAILY AS DIRECTED   VICTOZA 18 MG/3ML Sopn Generic drug:  liraglutide DIAL AND INJECT SUBCUTANEOUSLY 1.2MG DAILY   Vitamin D 2000 units tablet Take 2,000 Units by mouth daily with breakfast.       Allergies:  Allergies  Allergen Reactions  . Flagyl [Metronidazole Hcl] Itching and Rash  . Ciprofloxacin Itching and Rash  . Metformin And Related Rash  . Milk-Related Compounds Other (See Comments)    Stomach pains  . Other Other (See Comments)    [place] nuts cause severe facial redness    Past Medical History:  Diagnosis Date  . Depression    Bipolar  . Diabetes mellitus    diet controlled  . GERD (gastroesophageal reflux disease)   . Hyperlipidemia   . Hypertension   . Ischemic colitis, enteritis, or enterocolitis ([place])   . Sleep apnea   . Stroke ([place])    x's 2  . Thyroid disease   . Urinary bladder incontinence   . Vertigo     Past Surgical History:  Procedure Laterality Date  . ABDOMINAL HYSTERECTOMY     partial   . APPENDECTOMY    . CHOLECYSTECTOMY    . FRACTURE SURGERY    . HEMORRHOID SURGERY    . RETINAL DETACHMENT SURGERY    . TONSILLECTOMY      Family History  Problem Relation  Age of Onset  . Heart disease Mother   . Stroke Father   . Cancer Sister   . Cancer Brother   . Parkinson's disease Father     Social History:  reports that  has never smoked. she has never used smokeless tobacco. She reports that she does not drink alcohol or use drugs.    Review of Systems   She has had less edema and takes Lasix regularly now  NEUROPATHY: She has lower leg pain and takes gabapentin twice a day for this, however does not have any symptoms      Lipids: Last LDL from PCP was 101, followed by PCP also        Lab Results  Component Value Date   CHOL 175 08/17/2016   HDL 58.00 08/17/2016   LDLCALC 88 08/17/2016   TRIG 142.0 08/17/2016   CHOLHDL 3 08/17/2016       Thyroid:   Has had presumed hypothyroidism for over 20 years, has not had a change in medications in several years and last TSH was 1.9 in August from PCP   Lab Results  Component Value Date   TSH 1.32 12/19/2015   TSH 2.39 01/27/2015   TSH 1.90 08/21/2014   FREET4 0.86 01/31/2014   FREET4 0.91 08/28/2013       The blood pressure has been controlled with a combination of amlodipine and Benicar as well as hydralazine and bisoprolol      Physical  Examination:  BP 124/68   Pulse 78   Ht _0  (1.6 m)   Wt 235 lb 6.4 oz (106.8 kg)   SpO2 95%   BMI 41.70 kg/m      ASSESSMENT/PLAN:   Diabetes type 2, uncontrolled   See history of present illness for detailed discussion of his current management, blood sugar patterns and problems identified  Her blood sugars are Looking better now compared to her last visit This has improved mostly with her trying to change her V-go pump consistently around the same time every evening However not checking enough readings after meals and has only sporadic readings lately Blood sugars maybe higher with eating cereal in the morning and discussed balancing all meals with some protein and not excessive carbohydrate  Other recommendations: She needs to  check more readings after supper Also since she thinks she is eating more at lunch she can take about 12 units of cover her lunch and also reduce suppertime dose down to 10-12 units based on her meal size since her readings at night are about 100 She will let us know if she has more variability in her blood sugars but for now will continue the same regimen Also she can try and adjust her doses further with better postprandial monitoring  Follow-up in 3 months again   There are no Patient Instructions on file for this visit.   Jenina Moening 11/23/2016, 4:22 PM   Note: This office note was prepared with Dragon voice recognition system technology. Any transcriptional errors that result from this process are unintentional.                     Patient ID: Harriett Rush, female   DOB: 07-01-1940, 76 y.o.   MRN: [id number]    Reason for Appointment: Followup for Type 2 Diabetes and swelling of legs  History of Present Illness:          Diagnosis: Type 2 diabetes mellitus, date of diagnosis:2011       Past history: Since she was apparently intolerant to metformin she was treated with Onglyza until about 2014  At that time because of increasing A1c of about 8% she was started on Levemir 15 units and this was aggressively increased Onglyza was continued She has not tried any other treatments for diabetes Her A1c has been 10-10.5 in 2015 In 3/15 she was told to start small doses of NovoLog at lunch and supper and add 15 units of Levemir at night also To control  hyperglycemia with her basal bolus insulin regimen she was given Victoza in addition on her initial consultation in 5/15  Recent history:   INSULIN regimen :  V-go pump 40 unit basal, boluses at meal times: 6-6-8 units  She was changed on 09/19/14 to the V-go pump instead of basal bolus insulin shots She has been on Victoza since 8/15 and she is taking 1.2 mg With the V-go pump her blood sugars have improved overall and  are less variable  Current blood sugar patterns and problems:  Fasting blood sugars are very consistent, usually just above 100  She has stopped checking her blood sugars later in the day even though she was told to vary the times when she checks her sugar  She has no difficulty remembering to do her boluses with meals but usually does not change the amounts  She has improved her diet and is trying to  eliminate high glycemic index foods and juices  Recently  trying to walk more regularly also  No hypoglycemia  She has no difficulty with using the V-go pump, doing the boluses when she is eating out and changing it every morning    Oral hypoglycemic drugs the patient is taking are: None    Side effects from medications have been: Metformin: rash  Glucose monitoring:  done 1 time a day         Glucometer:  One Touch.      Blood Glucose readings from home glucose meter download:  FASTING readings recently: 90-147, usually under 130.  Her monitor has incorrect date and time programmed   Glycemic control:   A1c was 7  from PCP in 8/16  Lab Results  Component Value Date   HGBA1C 7.7 (H) 11/18/2016   HGBA1C 7.8 (H) 08/17/2016   HGBA1C 8.1 (H) 03/19/2016   Lab Results  Component Value Date   MICROALBUR <0.7 03/22/2016   LDLCALC 88 08/17/2016   CREATININE 0.93 11/18/2016    Self-care: The diet that the patient has been following is: tries to limit fats     Meals: 3 meals per day. Bfst oatmeal or egg/toast at 8-9 am, dinner 5 pm cutting back on frequent meals and  portions; no hs snacks           Exercise: Walks 15-20 min       Dietician visit: Most recent: 2014 ( class).               Compliance with the medical regimen: Good  Retinal exam: Most recent:.9/14    Weight history:  Wt Readings from Last 3 Encounters:  11/23/16 235 lb 6.4 oz (106.8 kg)  08/20/16 233 lb (105.7 kg)  05/21/16 229 lb 3.2 oz (104 kg)    Allergies as of 11/23/2016      Reactions   Flagyl  [metronidazole Hcl] Itching, Rash   Ciprofloxacin Itching, Rash   Metformin And Related Rash   Milk-related Compounds Other (See Comments)   Stomach pains   Other Other (See Comments)   [place] nuts cause severe facial redness      Medication List        Accurate as of 11/23/16  4:22 PM. Always use your most recent med list.          aspirin 325 MG tablet Take 325 mg by mouth daily with breakfast.   betamethasone dipropionate 0.05 % cream Commonly known as:  DIPROLENE Apply 1 application topically 2 (two) times daily.   carbamazepine 200 MG 12 hr capsule Commonly known as:  CARBATROL Take 200 mg by mouth 2 (two) times daily.   clobetasol 0.05 % Gel Commonly known as:  TEMOVATE Apply 1 application topically 2 (two) times daily.   diltiazem 300 MG 24 hr capsule Commonly known as:  CARDIZEM CD Take 300 mg by mouth daily.   donepezil 10 MG tablet Commonly known as:  ARICEPT Take 10 mg by mouth daily with supper.   Dulaglutide 1.5 MG/0.5ML Sopn Commonly known as:  TRULICITY Inject 1.5 mg weekly   esomeprazole 40 MG capsule Commonly known as:  NEXIUM Take 40 mg by mouth daily with supper.   folic acid 1 MG tablet Commonly known as:  FOLVITE Take 1 mg by mouth daily.   gabapentin 300 MG capsule Commonly known as:  NEURONTIN Take 300 mg by mouth 2 (two) times daily.   glucose blood test strip Commonly known as:  ONETOUCH VERIO Use to test blood sugar 3 times daily  HUMALOG 100 UNIT/ML injection Generic drug:  insulin lispro INJECT 0.78ML (78UNITS) INTO THE SKIN VIA V-GO PUMP AS DIRECTED   Insulin Pen Needle 32G X 4 MM Misc Use 8 pen needles per day   Insulin Syringe-Needle U-100 31G X 15/64" 1 ML Misc Commonly known as:  BD INSULIN SYRINGE SAFETYGLIDE Use 1 syringe to inject insulin into V-Go pump daily   INVOKANA 300 MG Tabs tablet Generic drug:  canagliflozin TAKE ONE TABLET BY MOUTH DAILY BEFORE BREAKFAST   levothyroxine 75 MCG tablet Commonly  known as:  SYNTHROID, LEVOTHROID Take 75 mcg by mouth daily.   methotrexate 2.5 MG tablet Commonly known as:  RHEUMATREX Per pt: takes 3 tablets every Monday   nitroGLYCERIN 0.4 MG SL tablet Commonly known as:  NITROSTAT Place 1 tablet (0.4 mg total) under the tongue every 5 (five) minutes as needed for chest pain.   OLANZapine 5 MG tablet Commonly known as:  ZYPREXA Take 5 mg by mouth at bedtime.   olmesartan 40 MG tablet Commonly known as:  BENICAR Take 1 tablet (40 mg total) by mouth daily.   ondansetron 8 MG tablet Commonly known as:  ZOFRAN Take 1 tablet (8 mg total) by mouth every 8 (eight) hours as needed for nausea.   ONETOUCH DELICA LANCETS 76P Misc USE LANCET TO TEST FIVE TIMES A DAY   spironolactone 50 MG tablet Commonly known as:  ALDACTONE TAKE ONE TABLET BY MOUTH DAILY   V-GO 40 Kit USE ONCE  DAILY AS DIRECTED   VICTOZA 18 MG/3ML Sopn Generic drug:  liraglutide DIAL AND INJECT SUBCUTANEOUSLY 1.2MG DAILY   Vitamin D 2000 units tablet Take 2,000 Units by mouth daily with breakfast.       Allergies:  Allergies  Allergen Reactions  . Flagyl [Metronidazole Hcl] Itching and Rash  . Ciprofloxacin Itching and Rash  . Metformin And Related Rash  . Milk-Related Compounds Other (See Comments)    Stomach pains  . Other Other (See Comments)    [place] nuts cause severe facial redness    Past Medical History:  Diagnosis Date  . Depression    Bipolar  . Diabetes mellitus    diet controlled  . GERD (gastroesophageal reflux disease)   . Hyperlipidemia   . Hypertension   . Ischemic colitis, enteritis, or enterocolitis ([place])   . Sleep apnea   . Stroke ([place])    x's 2  . Thyroid disease   . Urinary bladder incontinence   . Vertigo     Past Surgical History:  Procedure Laterality Date  . ABDOMINAL HYSTERECTOMY     partial   . APPENDECTOMY    . CHOLECYSTECTOMY    . FRACTURE SURGERY    . HEMORRHOID SURGERY    . RETINAL DETACHMENT SURGERY    .  TONSILLECTOMY      Family History  Problem Relation Age of Onset  . Heart disease Mother   . Stroke Father   . Cancer Sister   . Cancer Brother   . Parkinson's disease Father     Social History:  reports that  has never smoked. she has never used smokeless tobacco. She reports that she does not drink alcohol or use drugs.    Review of Systems   DIARRHEA:  Currently off Benicar and she thinks her diarrhea has resolved with stopping this     The blood pressure has been controlled with a combination of Bisoprolol, diltiazem  as well as Aldactone.  Also on Invokana 300 mg  She is not taking an ARB drug now   Lab Results  Component Value Date   CREATININE 0.93 11/18/2016   BUN 13 11/18/2016   NA 134 (L) 11/18/2016   K 4.9 11/18/2016   CL 100 11/18/2016   CO2 29 11/18/2016    NEUROPATHY: She has lower leg pain and takes gabapentin twice a day  LIPIDS: Appear controlled,Treatment regimen followed by PCP         Lab Results  Component Value Date   CHOL 175 08/17/2016   HDL 58.00 08/17/2016   LDLCALC 88 08/17/2016   TRIG 142.0 08/17/2016   CHOLHDL 3 08/17/2016       Thyroid:    Has had presumed hypothyroidism for over 20 years, has not had a change in medications in several years and last TSH was Normal :   Lab Results  Component Value Date   TSH 1.32 12/19/2015   TSH 2.39 01/27/2015   TSH 1.90 08/21/2014   FREET4 0.86 01/31/2014   FREET4 0.91 08/28/2013    Depression: She is still taking Zyprexa and does not think she can stop this   Physical Examination:  BP 124/68   Pulse 78   Ht _0  (1.6 m)   Wt 235 lb 6.4 oz (106.8 kg)   SpO2 95%   BMI 41.70 kg/m    ASSESSMENT/PLAN:   Diabetes type 2, uncontrolled   See history of present illness for detailed discussion of current  management, blood sugar patterns and problems identified.  A1c is still not at target and high at 7.7  She is gaining weight again Although recently her fasting blood sugars  appear to be relatively good day are fluctuating Most likely has postprandial readings which she does not check much For now she does seem to be keeping her portions controlled but she tends to have snacks and also drinking a lot of milk that may be causing her sugars to be high Discussed day-to-day management for her diabetes and need for better control and monitoring  Recommended the following:   Start taking VICTOZA 1.8 mg in the morning  Her meter was programmed to the right date and time  Given her the option of taking extra Humalog with a syringe if she is now getting enough boluses or needs extra for snacks  Continue Invokana  Consultation with dietitian  There are no Patient Instructions on file for this visit.    Breeley Bischof 11/23/2016, 4:22 PM   Note: This office note was prepared with Dragon voice recognition system technology. Any transcriptional errors that result from this process are unintentional.

## 2016-11-26 ENCOUNTER — Other Ambulatory Visit: Payer: Self-pay | Admitting: Endocrinology

## 2016-11-27 ENCOUNTER — Other Ambulatory Visit: Payer: Self-pay | Admitting: Endocrinology

## 2016-12-03 ENCOUNTER — Other Ambulatory Visit: Payer: Self-pay | Admitting: Internal Medicine

## 2016-12-03 DIAGNOSIS — Z1231 Encounter for screening mammogram for malignant neoplasm of breast: Secondary | ICD-10-CM

## 2016-12-08 ENCOUNTER — Other Ambulatory Visit: Payer: Self-pay | Admitting: Endocrinology

## 2016-12-13 ENCOUNTER — Other Ambulatory Visit: Payer: Self-pay | Admitting: Endocrinology

## 2016-12-14 ENCOUNTER — Other Ambulatory Visit: Payer: Self-pay | Admitting: Endocrinology

## 2016-12-22 ENCOUNTER — Ambulatory Visit (INDEPENDENT_AMBULATORY_CARE_PROVIDER_SITE_OTHER): Payer: Medicare Other | Admitting: Ophthalmology

## 2016-12-22 DIAGNOSIS — H43813 Vitreous degeneration, bilateral: Secondary | ICD-10-CM

## 2016-12-22 DIAGNOSIS — H35033 Hypertensive retinopathy, bilateral: Secondary | ICD-10-CM | POA: Diagnosis not present

## 2016-12-22 DIAGNOSIS — I10 Essential (primary) hypertension: Secondary | ICD-10-CM | POA: Diagnosis not present

## 2016-12-22 DIAGNOSIS — E11319 Type 2 diabetes mellitus with unspecified diabetic retinopathy without macular edema: Secondary | ICD-10-CM | POA: Diagnosis not present

## 2016-12-22 DIAGNOSIS — E113293 Type 2 diabetes mellitus with mild nonproliferative diabetic retinopathy without macular edema, bilateral: Secondary | ICD-10-CM | POA: Diagnosis not present

## 2016-12-22 DIAGNOSIS — H338 Other retinal detachments: Secondary | ICD-10-CM

## 2016-12-22 LAB — HM DIABETES EYE EXAM

## 2017-01-03 ENCOUNTER — Ambulatory Visit
Admission: RE | Admit: 2017-01-03 | Discharge: 2017-01-03 | Disposition: A | Discharge status: Home / Self Care | Payer: Medicare Other | Source: Ambulatory Visit | Attending: Internal Medicine | Admitting: Internal Medicine

## 2017-01-03 DIAGNOSIS — Z1231 Encounter for screening mammogram for malignant neoplasm of breast: Secondary | ICD-10-CM

## 2017-01-14 ENCOUNTER — Encounter: Payer: Medicare Other | Attending: Endocrinology | Admitting: Dietician

## 2017-01-14 ENCOUNTER — Encounter: Payer: Self-pay | Admitting: Dietician

## 2017-01-14 DIAGNOSIS — E1165 Type 2 diabetes mellitus with hyperglycemia: Secondary | ICD-10-CM | POA: Insufficient documentation

## 2017-01-14 DIAGNOSIS — Z713 Dietary counseling and surveillance: Secondary | ICD-10-CM | POA: Diagnosis present

## 2017-01-14 DIAGNOSIS — IMO0002 Reserved for concepts with insufficient information to code with codable children: Secondary | ICD-10-CM

## 2017-01-14 DIAGNOSIS — Z794 Long term (current) use of insulin: Secondary | ICD-10-CM | POA: Insufficient documentation

## 2017-01-14 DIAGNOSIS — E118 Type 2 diabetes mellitus with unspecified complications: Secondary | ICD-10-CM

## 2017-01-14 NOTE — Patient Instructions (Addendum)
Find some form of activity most days of the week.  Baby steps.  Walk to the mailbox daily. Encouraged her to wear her C-pap consistently and have it re-evaluated.  Eat slowly. Pause half way through.  When are you satisfied. When eating out consider getting a to-go box at the beginning of the meal. Small portion of protein with each meal and snack.   Aim for 2-3 Carb Choices per meal (30-45 grams) +/- 1 either way  Aim for 0-1 Carbs per snack if hungry  Include protein in moderation with your meals and snacks Consider reading food labels for Total Carbohydrate and Fat Grams of foods Continue checking BG at alternate times per day as directed by MD  Continue taking medication as directed by MD

## 2017-01-14 NOTE — Progress Notes (Signed)
Diabetes Self-Management Education  Visit Type: First/Initial  Appt. Start Time: 1315 Appt. End Time: 1425  01/14/2017  [name], identified by name and date of birth, is a 76 y.o. female with a diagnosis of Diabetes: Type 2.  Other history includes bipolar depression, GERD, Hyperlipidemia, HTN, OSA on C-pap reluctantly and partially each night, CVA's, hypothyroidism.  Patient is here today with her son.  Concerns related to control of her diabetes, frequent shaking between meals despite blood sugar in the 80's or 90's.  She wakes up with these symptoms as well and eats a bowl of cereal then returns to sleep.  Medications include Victoza, Trulicity, Invokana, Humalog via V-go.  She uses 6 clicks with breakfast and lunch and 7-8 clicks with dinner.  She eats a meal or snack or skim milk every 2-3 hours due to symptoms of a low blood sugar.  Her meals are imbalanced with increased amounts when she eats out as well as no carbohydrates with some meals and too many at other meals without an adjustment to the insulin taken prior to the meal.  Weight is slowly increasing.  235 today increased from 05/21/16 weight of 229 lbs.  Patient lives alone.  She is a retired Surveyor, minerals.  She drives and does her own shopping.  She is lactose intolerant.  She hates exercise but walks well when out with family.  She feels that her appetite is excessive.  Some of her medications can cause increased appetite and weight gain while other cause a lower appetite and can cause a loss.  She has been seen by a Gerontology clinic that reduced her medications some in the past.  CBG in the am often 120 and was 139 this am.  Post meal 140-160 per patient.  ASSESSMENT  Height 5\' 3"  (1.6 m), weight 235 lb (106.6 kg). Body mass index is 41.63 kg/m.  Diabetes Self-Management Education - 01/14/17 1315      Visit Information   Visit Type  First/Initial      Initial Visit   Diabetes Type  Type 2     Are you currently following a meal plan?  No    Are you taking your medications as prescribed?  Yes    Date Diagnosed  2011 and on insulin since 2015      Health Coping   How would you rate your overall health?  Good      Psychosocial Assessment   Patient Belief/Attitude about Diabetes  Motivated to manage diabetes    Self-care barriers  None    Self-management support  Doctor's office;Friends;Family    Other persons present  Patient;Family Member    Patient Concerns  Nutrition/Meal planning;Glycemic Control;Weight Control    Special Needs  None    Preferred Learning Style  No preference indicated    Learning Readiness  Ready    How often do you need to have someone help you when you read instructions, pamphlets, or other written materials from your doctor or pharmacy?  1 - Never    What is the last grade level you completed in school?  4 years college      Pre-Education Assessment   Patient understands the diabetes disease and treatment process.  Needs Review    Patient understands incorporating nutritional management into lifestyle.  Needs Review    Patient undertands incorporating physical activity into lifestyle.  Needs Review    Patient understands using medications safely.  Needs Review    Patient understands  monitoring blood glucose, interpreting and using results  Needs Review    Patient understands prevention, detection, and treatment of acute complications.  Needs Review    Patient understands prevention, detection, and treatment of chronic complications.  Needs Review    Patient understands how to develop strategies to address psychosocial issues.  Needs Review    Patient understands how to develop strategies to promote health/change behavior.  Needs Review      Complications   Last HgB A1C per patient/outside source  7.7 % 11/2016 decreased from 8.1% 03/2016    How often do you check your blood sugar?  1-2 times/day    Fasting Blood glucose range (mg/dL)  09-811;[id number];130-179     Postprandial Blood glucose range (mg/dL)  [phone]    Number of hypoglycemic episodes per month  0    Number of hyperglycemic episodes per week  0    Have you had a dilated eye exam in the past 12 months?  Yes    Have you had a dental exam in the past 12 months?  Yes    Are you checking your feet?  Yes    How many days per week are you checking your feet?  7      Dietary Intake   Breakfast  [place] sausage, handful of blueberries, english muffin OR [place] sausage alone or bacon OR eggs OR (cheerios or rice Krispies) cereal and lactaid milk OR oatmeal OR grits 8    Snack (morning)  fruit or toast with peanut butter or yogurt 10:30    Lunch  [place] breast, tomatoes, pickle or olives with or without crackers and rarely with bread, occasional fruit  12    Snack (afternoon)  PB crackers (homemade) or occasional sweet (cake, pie, cookies) 2:30    Dinner  meat, vegetable, fruit, occasional sweet potato (plain) or baked potato with a lot of butter OR out to eat at [employer], [address], [address] or other nice retaurant.  She orders low fat but eats all. 4:30-5:30    Snack (evening)  banana or cereal and milk or spoon of peanut butter or fruit 9    Beverage(s)  water, unsweetened iced tea, lactaid skim milk, rare diet soda, coffee with lactaid milk      Exercise   Exercise Type  ADL's    How many days per week to you exercise?  0    How many minutes per day do you exercise?  0    Total minutes per week of exercise  0      Patient Education   Disease state   Definition of diabetes, type 1 and 2, and the diagnosis of diabetes    Nutrition management   Role of diet in the treatment of diabetes and the relationship between the three main macronutrients and blood glucose level;Food label reading, portion sizes and measuring food.;Meal options for control of blood glucose level and chronic complications.;Information on hints to eating out and maintain blood glucose control.;Meal timing in regards  to the patients' current diabetes medication.;Reviewed blood glucose goals for pre and post meals and how to evaluate the patients' food intake on their blood glucose level.;Carbohydrate counting    Physical activity and exercise   Role of exercise on diabetes management, blood pressure control and cardiac health.;Helped patient identify appropriate exercises in relation to his/her diabetes, diabetes complications and other health issue.    Medications  Reviewed patients medication for diabetes, action, purpose, timing of dose and side effects.  Monitoring  Purpose and frequency of SMBG.;Identified appropriate SMBG and/or A1C goals.;Daily foot exams;Yearly dilated eye exam    Acute complications  Taught treatment of hypoglycemia - the 15 rule.    Chronic complications  Relationship between chronic complications and blood glucose control;Dental care    Psychosocial adjustment  Worked with patient to identify barriers to care and solutions;Identified and addressed patients feelings and concerns about diabetes;Role of stress on diabetes      Individualized Goals (developed by patient)   Nutrition  General guidelines for healthy choices and portions discussed    Physical Activity  Exercise 3-5 times per week;15 minutes per day    Medications  take my medication as prescribed    Problem Solving  balancing carbohydrate and protein with meals and snacks    Reducing Risk  examine blood glucose patterns    Health Coping  discuss diabetes with (comment) MD/RD/CDE      Post-Education Assessment   Patient understands the diabetes disease and treatment process.  Demonstrates understanding / competency    Patient understands incorporating nutritional management into lifestyle.  Needs Review    Patient undertands incorporating physical activity into lifestyle.  Needs Review    Patient understands using medications safely.  Demonstrates understanding / competency    Patient understands monitoring blood  glucose, interpreting and using results  Demonstrates understanding / competency    Patient understands prevention, detection, and treatment of acute complications.  Demonstrates understanding / competency    Patient understands prevention, detection, and treatment of chronic complications.  Demonstrates understanding / competency    Patient understands how to develop strategies to address psychosocial issues.  Demonstrates understanding / competency    Patient understands how to develop strategies to promote health/change behavior.  Needs Review      Outcomes   Expected Outcomes  Demonstrated interest in learning. Expect positive outcomes    Future DMSE  2 months    Program Status  Not Completed       Individualized Plan for Diabetes Self-Management Training:   Learning Objective:  Patient will have a greater understanding of diabetes self-management. Patient education plan is to attend individual and/or group sessions per assessed needs and concerns.   Plan:   Patient Instructions  Find some form of activity most days of the week.  Baby steps.  Walk to the mailbox daily. Encouraged her to wear her C-pap consistently and have it re-evaluated.  Eat slowly. Pause half way through.  When are you satisfied. When eating out consider getting a to-go box at the beginning of the meal. Small portion of protein with each meal and snack.   Aim for 2-3 Carb Choices per meal (30-45 grams) +/- 1 either way  Aim for 0-1 Carbs per snack if hungry  Include protein in moderation with your meals and snacks Consider reading food labels for Total Carbohydrate and Fat Grams of foods Continue checking BG at alternate times per day as directed by MD  Continue taking medication as directed by MD      Expected Outcomes:  Demonstrated interest in learning. Expect positive outcomes  Education material provided: Living Well with Diabetes, A1C conversion sheet, Meal plan card, My Plate and Snack  sheet  If problems or questions, patient to contact team via:  Phone  Future DSME appointment: 2 months

## 2017-01-19 ENCOUNTER — Other Ambulatory Visit: Payer: Self-pay | Admitting: Endocrinology

## 2017-02-08 ENCOUNTER — Other Ambulatory Visit: Payer: Self-pay

## 2017-02-08 MED ORDER — INSULIN PEN NEEDLE 32G X 4 MM MISC: 1 refills | Status: DC

## 2017-02-18 ENCOUNTER — Telehealth: Payer: Self-pay | Admitting: Pulmonary Disease

## 2017-02-18 ENCOUNTER — Ambulatory Visit (INDEPENDENT_AMBULATORY_CARE_PROVIDER_SITE_OTHER): Payer: Medicare Other | Admitting: Pulmonary Disease

## 2017-02-18 ENCOUNTER — Encounter: Payer: Self-pay | Admitting: Pulmonary Disease

## 2017-02-18 DIAGNOSIS — G4733 Obstructive sleep apnea (adult) (pediatric): Secondary | ICD-10-CM

## 2017-02-18 NOTE — Patient Instructions (Signed)
Schedule home sleep study.  Call our sleep lab at [phone] to make appointment for mask fitting session, based on this we will send a prescription for the right fitting mask. We will change DME to a different company

## 2017-02-18 NOTE — Progress Notes (Signed)
Subjective:    Patient ID: [name], female    DOB: October 06, 1940, 77 y.o.   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 She was diagnosed more than 10 years ago and is in her third machine.  However she never found a good fitting mask.  Last Pap she had was a history of nasal pillows but she feels that she was claustrophobic with this and  headbands did not fit.  She has not used her machine in a year.  Her symptoms have noted loud snoring. She reports excessive daytime somnolence and wakes up feeling tired and groggy. Epworth sleepiness score is 12. Bedtime is between 10:11 PM, there is a TV in the bedroom, sleep latency is about 1 hour, he starts to sleep on her back but then moves all over, uses 2 pillows, reports 3-4 nocturnal awakenings due to snoring and nocturia and is out of bed latest by 9 AM feeling tired with dryness of mouth but denies headaches.  Her weight has been unchanged over the last few years. 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 Depression    Bipolar  . Diabetes mellitus    diet controlled  . GERD (gastroesophageal reflux disease)   . Hyperlipidemia   . Hypertension   . Ischemic colitis, enteritis, or enterocolitis (HCC)   . Sleep apnea   . Stroke (HCC)    x's 2  . Thyroid disease   . Urinary bladder incontinence   . Vertigo     Past Surgical History:  Procedure Laterality Date  . ABDOMINAL HYSTERECTOMY     partial   . APPENDECTOMY    . CARDIAC CATHETERIZATION N/A 12/24/2014   Procedure: Right/Left Heart Cath and Coronary Angiography;  Surgeon: Thurmon FairMihai Croitoru, MD;   Location: [facility];  Service: Cardiovascular;  Laterality: N/A;  . CHOLECYSTECTOMY    . FRACTURE SURGERY    . HEMORRHOID SURGERY    . RETINAL DETACHMENT SURGERY    . TONSILLECTOMY      Allergies  Allergen Reactions  . Flagyl [Metronidazole Hcl] Itching and Rash  . Ciprofloxacin Itching and Rash  . Metformin And Related Rash  . Milk-Related Compounds Other (See Comments)    Stomach pains  . Other Other (See Comments)    [place] nuts cause severe facial redness    Social History   Socioeconomic History  . Marital status: Widowed    Spouse name: Not on file  . Number of children: 2  . Years of education: 5516  . Highest education level: Not on file  Social Needs  . Financial resource strain: Not on file  . Food insecurity - worry: Not on file  . Food insecurity - inability: Not on file  . Transportation needs - medical: Not on file  . Transportation needs - non-medical: Not on file  Occupational History  . Occupation: Retired  Tobacco Use  . Smoking status: Never Smoker  . Smokeless tobacco: Never Used  Substance and Sexual Activity  . Alcohol use: No  . Drug use: No  . Sexual activity: Not on file  Other Topics Concern  .  Not on file  Social History Narrative   Lives at home alone.   Right-handed.   No caffeine use.     Family History  Problem Relation Age of Onset  . Heart disease Mother   . Stroke Father   . Parkinson's disease Father   . Cancer Sister   . Cancer Brother      Review of Systems   Constitutional: negative for anorexia, fevers and sweats  Eyes: negative for irritation, redness and visual disturbance  Ears, nose, mouth, throat, and face: negative for earaches, epistaxis, nasal congestion and sore throat  Respiratory: negative for cough, dyspnea on exertion, sputum and wheezing  Cardiovascular: negative for chest pain, dyspnea, lower extremity edema, orthopnea, palpitations and syncope  Gastrointestinal: negative for abdominal  pain, constipation, diarrhea, melena, nausea and vomiting  Genitourinary:negative for dysuria, frequency and hematuria  Hematologic/lymphatic: negative for bleeding, easy bruising and lymphadenopathy  Musculoskeletal:negative for arthralgias, muscle weakness and stiff joints  Neurological: negative for coordination problems, gait problems, headaches and weakness  Endocrine: negative for diabetic symptoms including polydipsia, polyuria and weight loss     Objective:   Physical Exam  Gen. Pleasant, obese, in no distress ENT - no lesions, no post nasal drip Neck: No JVD, no thyromegaly, no carotid bruits Lungs: no use of accessory muscles, no dullness to percussion, decreased without rales or rhonchi  Cardiovascular: Rhythm regular, heart sounds  normal, no murmurs or gallops, 1+ peripheral edema Musculoskeletal: No deformities, no cyanosis or clubbing , no tremors        Assessment & Plan:

## 2017-02-18 NOTE — Assessment & Plan Note (Signed)
Schedule home sleep study.  Call our sleep lab at [phone] to make appointment for mask fitting session, based on this we will send a prescription for the right fitting mask. We will change DME to a different company  Weight loss encouraged, compliance with goal of at least 4-6 hrs every night is the expectation. Advised against medications with sedative side effects Cautioned against driving when sleepy - understanding that sleepiness will vary on a day to day basis

## 2017-02-18 NOTE — Telephone Encounter (Signed)
Faxed a cover sheet to [facility] for a copy of patient's sleep study. RA wants the patient to switch to another DME. Will route this to myself so that I can place the order once the SS has been received.

## 2017-02-22 ENCOUNTER — Other Ambulatory Visit (INDEPENDENT_AMBULATORY_CARE_PROVIDER_SITE_OTHER): Payer: Medicare Other

## 2017-02-22 ENCOUNTER — Telehealth: Payer: Self-pay | Admitting: Endocrinology

## 2017-02-22 ENCOUNTER — Other Ambulatory Visit: Payer: Self-pay

## 2017-02-22 DIAGNOSIS — E1165 Type 2 diabetes mellitus with hyperglycemia: Secondary | ICD-10-CM

## 2017-02-22 DIAGNOSIS — Z794 Long term (current) use of insulin: Secondary | ICD-10-CM | POA: Diagnosis not present

## 2017-02-22 LAB — BASIC METABOLIC PANEL
BUN: 13 mg/dL (ref 6–23)
CO2: 28 mEq/L (ref 19–32)
CREATININE: 1.03 mg/dL (ref 0.40–1.20)
Calcium: 9.6 mg/dL (ref 8.4–10.5)
Chloride: 97 mEq/L (ref 96–112)
GFR: 55.4 mL/min — AB (ref >60.00)
Glucose, Bld: 145 mg/dL — ABNORMAL HIGH (ref 70–99)
POTASSIUM: 5.3 meq/L — AB (ref 3.5–5.1)
Sodium: 132 mEq/L — ABNORMAL LOW (ref 135–145)

## 2017-02-22 LAB — HEMOGLOBIN A1C: HEMOGLOBIN A1C: 7.2 % — AB (ref 4.6–6.5)

## 2017-02-22 MED ORDER — LIRAGLUTIDE 18 MG/3ML ~~LOC~~ SOPN: PEN_INJECTOR | SUBCUTANEOUS | 2 refills | Status: DC

## 2017-02-22 NOTE — Telephone Encounter (Signed)
Huston FoleyBrady from [facility] called stated they haven't received patient prescription for victoza with increased quantity. Please advise [phone]

## 2017-02-22 NOTE — Telephone Encounter (Signed)
I have sent in the updated prescription to [facility] for the Victoza to inject 1.8mg  daily per Dr. Lucianne MussKumar.

## 2017-02-24 ENCOUNTER — Ambulatory Visit (INDEPENDENT_AMBULATORY_CARE_PROVIDER_SITE_OTHER): Payer: Medicare Other | Admitting: Endocrinology

## 2017-02-24 ENCOUNTER — Encounter: Payer: Medicare Other | Attending: Endocrinology | Admitting: Dietician

## 2017-02-24 ENCOUNTER — Encounter: Payer: Self-pay | Admitting: Endocrinology

## 2017-02-24 VITALS — BP 144/78 | HR 70 | Ht 63.0 in | Wt 230.0 lb

## 2017-02-24 DIAGNOSIS — Z713 Dietary counseling and surveillance: Secondary | ICD-10-CM | POA: Insufficient documentation

## 2017-02-24 DIAGNOSIS — E875 Hyperkalemia: Secondary | ICD-10-CM | POA: Diagnosis not present

## 2017-02-24 DIAGNOSIS — E1165 Type 2 diabetes mellitus with hyperglycemia: Secondary | ICD-10-CM | POA: Insufficient documentation

## 2017-02-24 DIAGNOSIS — I10 Essential (primary) hypertension: Secondary | ICD-10-CM

## 2017-02-24 DIAGNOSIS — Z794 Long term (current) use of insulin: Secondary | ICD-10-CM | POA: Diagnosis not present

## 2017-02-24 DIAGNOSIS — E118 Type 2 diabetes mellitus with unspecified complications: Secondary | ICD-10-CM

## 2017-02-24 MED ORDER — LIRAGLUTIDE 18 MG/3ML ~~LOC~~ SOPN: 1.8000 mg | PEN_INJECTOR | Freq: Every day | SUBCUTANEOUS | 3 refills | Status: DC

## 2017-02-24 NOTE — Patient Instructions (Signed)
Great job on the changes that you have made! Continue to work on eating slowly

## 2017-02-24 NOTE — Patient Instructions (Addendum)
Check blood sugars on waking up  3/7  Also check blood sugars about 2 hours after a meal and do this after different meals by rotation  Recommended blood sugar levels on waking up is 90-130 and about 2 hours after meal is 130-160  Please bring your blood sugar monitor to each visit, thank you  Cut Spironloactone to 1/2 of 50mg 

## 2017-02-24 NOTE — Progress Notes (Signed)
Diabetes Self-Management Education  Visit Type:  Follow-up  Appt. Start Time: 1445 Appt. End Time: 1505  02/24/2017  [name], identified by name and date of birth, is a 77 y.o. female with a diagnosis of Diabetes: Type 2.  Other history includes bipolar depression, GERD, Hyperlipidemia, HTN, OSA on C-pap reluctantly and partially each night, CVA's, hypothyroidism.  Patient is here today alone.  Since her last visit 01/14/17 she has lost 5 lbs.  Her A1C is down from 7.7% 11/2016 to 7.2% 02/22/17.  Her fasting blood sugars have decreased and readings from her meter are 107-153 which is less than last visit.  She is also much more cheerful and happy about her progress.  She has not been eating fast food, she started walking 15 minutes 3-4 times per week and will slowly increase this, she also has increased her protein with meals and snacks and decreased her carbohydrate intake.  She states that she is now not hungry all of the time and is not having problems with the increased shakes between meals.  She is eating more healthy fats.  She is to get a new C-pap mask fitting next week.  She continues to take her medication as prescribed.  She needs to work on eating more slowly and continue changes made.  Medication includes Victoza, Invokana and Humalog via the V-go 5 clicks with breakfast and lunch and clicks with dinner.  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  ASSESSMENT  Weight 230 lb (104.3 kg). Body mass index is 40.74 kg/m.   Diabetes Self-Management Education - 02/24/17 1515      Psychosocial Assessment   Patient Belief/Attitude about Diabetes  Motivated to manage diabetes    Patient Concerns  Nutrition/Meal planning;Glycemic Control;Weight Control    Special Needs  None    Preferred Learning Style  No preference indicated    Learning Readiness  Ready      Pre-Education Assessment   Patient understands the  diabetes disease and treatment process.  Demonstrates understanding / competency    Patient understands incorporating nutritional management into lifestyle.  Needs Review    Patient undertands incorporating physical activity into lifestyle.  Needs Review    Patient understands using medications safely.  Demonstrates understanding / competency    Patient understands monitoring blood glucose, interpreting and using results  Demonstrates understanding / competency    Patient understands prevention, detection, and treatment of acute complications.  Demonstrates understanding / competency    Patient understands prevention, detection, and treatment of chronic complications.  Demonstrates understanding / competency    Patient understands how to develop strategies to address psychosocial issues.  Demonstrates understanding / competency    Patient understands how to develop strategies to promote health/change behavior.  Needs Review      Complications   Last HgB A1C per patient/outside source  7.2 %    How often do you check your blood sugar?  1-2 times/day    Fasting Blood glucose range (mg/dL)  78-295    Postprandial Blood glucose range (mg/dL)  621-308    Number of hypoglycemic episodes per month  0    Number of hyperglycemic episodes per week  0      Dietary Intake   Breakfast  [place] sausage, occasional egg or fruit OR cereal and milk or oatmeal with [place] sausage    Snack (morning)  walnuts or fruit with peanut butter    Lunch  chicken or [place], vegetables, occasional 1/2 english muffin or crackers and fruit    Snack (afternoon)  PB apple or 6 dates or berries    Dinner  more lean meat, vegetable, starch,     Snack (evening)  cheerios and milk    Beverage(s)  water, unsweetened tea      Exercise   Exercise Type  Light (walking / raking leaves)    How many days per week to you exercise?  4    How many minutes per day do you exercise?  15    Total minutes per week of exercise  60       Patient Education   Previous Diabetes Education  Yes (please comment) 01/14/17    Nutrition management   Other (comment) mindfullness    Physical activity and exercise   Other (comment) encouraged continued slow increase    Medications  Reviewed patients medication for diabetes, action, purpose, timing of dose and side effects.    Monitoring  Other (comment) reviewed blood sugar readings      Individualized Goals (developed by patient)   Nutrition  General guidelines for healthy choices and portions discussed    Physical Activity  Exercise 5-7 days per week;15 minutes per day    Medications  take my medication as prescribed    Monitoring   test my blood glucose as discussed    Problem Solving  balancing carbohydrate with protein at meals and snacks    Reducing Risk  examine blood glucose patterns    Health Coping  discuss diabetes with (comment) MD, RD, CDE      Patient Self-Evaluation of Goals - Patient rates self as meeting previously set goals (% of time)   Nutrition  50 - 75 %    Physical Activity  >75%    Medications  >75%    Monitoring  >75%    Problem Solving  >75%    Reducing Risk  >75%    Health Coping  >75%      Post-Education Assessment   Patient understands the diabetes disease and treatment process.  Demonstrates understanding / competency    Patient understands incorporating nutritional management into lifestyle.  Demonstrates understanding / competency    Patient undertands incorporating physical activity into lifestyle.  Demonstrates understanding / competency    Patient understands using medications safely.  Demonstrates understanding / competency    Patient understands monitoring blood glucose, interpreting and using results  Demonstrates understanding / competency    Patient understands prevention, detection, and treatment of acute complications.  Demonstrates understanding / competency    Patient understands prevention, detection, and treatment of chronic  complications.  Demonstrates understanding / competency    Patient understands how to develop strategies to address psychosocial issues.  Demonstrates understanding / competency    Patient understands how to develop strategies to promote health/change behavior.  Demonstrates understanding / competency      Outcomes   Program Status  Completed      Subsequent Visit   Since your last visit have you experienced any weight changes?  Loss    Weight Loss (lbs)  5    Since your last visit, are you checking your blood glucose at least once a day?  Yes       Learning Objective:  Patient will have a greater understanding of diabetes self-management. Patient education plan is to attend individual and/or group sessions per assessed needs and concerns.   Plan:   Patient Instructions  [employer]ie [employer] job on  the changes that you have made! Continue to work on eating slowly    Expected Outcomes:  Demonstrated interest in learning. Expect positive outcomes  Education material provided: none at this visit  If problems or questions, patient to contact team via:  Phone  Future DSME appointment: - PRN

## 2017-02-24 NOTE — Progress Notes (Signed)
Patient ID: [name], female   DOB: 06-01-1940, 77 y.o.   MRN: [id number]    Reason for Appointment: Followup for Type 2 Diabetes    History of Present Illness:          Diagnosis: Type 2 diabetes mellitus, date of diagnosis:2011       Past history: Since she was apparently intolerant to metformin she was treated with Onglyza until about 2014  At that time because of increasing A1c of about 8% she was started on Levemir 15 units and this was aggressively increased Onglyza was continued She has not tried any other treatments for diabetes Her A1c has been 10-10.5 in 2015 In 3/15 she was told to start small doses of NovoLog at lunch and supper and add 15 units of Levemir at night also To control  hyperglycemia with her basal bolus insulin regimen she was given Victoza in addition on her initial consultation in 5/15 She has been on Victoza since 8/15   Recent history:   INSULIN regimen :  V-go pump 40 unit basal, boluses at meal times: 10-27-12 units  Noninsulin hypoglycemic drugs the patient is taking are: Invokana 300 mg, on VICTOZA 1.8 mg  She was changed on 09/19/14 to the V-go pump instead of basal bolus insulin shots  Her A1c is now 7.2 Previous range up to 8.1   Current management, blood sugar patterns and problems:  She has seen the dietitian and made changes in her diet especially with adding proteins and modifying her meals including breakfast  With this she has lost 5 pounds  However she is still not checking her sugars after meals and only once or twice after lunch and dinner  She says she feels better with her sugars improving  However she has already cut down her boluses by 2 units on her own since she thinks her sugars were better in the morning also  FASTING blood sugars are averaging about 40 mg lower than on her last visit  Recently she is able to change her V-go pump consistently at the same time, 3 pm  VICTOZA was increased to 1.8 mg and  with this she is having better satiety  She was previously drinking a lot of milk which she has cut down on drinking more water  With her walking she has been able to get some weight off also  No hypoglycemic symptoms and lowest blood sugar is 97   Meals: 3 meals per day. Bfst oatmeal or egg or cereal at 9 am, dinner 6-7 pm.; no hs snacks   Side effects from medications have been: Metformin: rash  Glucose monitoring:  done 1 time a day or less, mostly in the morning       Glucometer:  One Touch.      Blood Glucose readings from home glucose meter download:   Mean values apply above for all meters except median for One Touch  PRE-MEAL Fasting Lunch Dinner Bedtime Overall  Glucose range:  99-158      Mean/median: 120     127+/-24   POST-MEAL PC Breakfast PC Lunch PC Dinner  Glucose range:   108, 140  104  Mean/median:      Previous blood sugar download results:  Mean values apply above for all meters except median for One Touch  PRE-MEAL Fasting Lunch Dinner Bedtime Overall  Glucose range: 10 9-218       Mean/median: 159     154    POST-MEAL  PC Breakfast PC Lunch PC Dinner  Glucose range:  198  153?    Mean/median:        Glycemic control:    Lab Results  Component Value Date   HGBA1C 7.2 (H) 02/22/2017   HGBA1C 7.7 (H) 11/18/2016   HGBA1C 7.8 (H) 08/17/2016   Lab Results  Component Value Date   MICROALBUR <0.7 03/22/2016   LDLCALC 88 08/17/2016   CREATININE 1.03 02/22/2017    Self-care: The diet that the patient has been following is: tries to limit fats   Breakfast: She will add [place] bacon or sausage for protein, usually getting eggs also           Exercise: Walks 20 min 3-4/7  days     Dietician visit: Most recent: 2/19   CDE visit: 9/16     Compliance with the medical regimen: Inconsistent   Weight history:  Wt Readings from Last 3 Encounters:  02/24/17 230 lb (104.3 kg)  02/24/17 230 lb (104.3 kg)  02/18/17 232 lb (105.2 kg)    Other  active problems: See review of systems    Allergies as of 02/24/2017      Reactions   Flagyl [metronidazole Hcl] Itching, Rash   Ciprofloxacin Itching, Rash   Metformin And Related Rash   Milk-related Compounds Other (See Comments)   Stomach pains   Other Other (See Comments)   [place] nuts cause severe facial redness      Medication List        Accurate as of 02/24/17  4:45 PM. Always use your most recent med list.          aspirin 325 MG tablet Take 325 mg by mouth daily with breakfast.   betamethasone dipropionate 0.05 % cream Commonly known as:  DIPROLENE Apply 1 application topically 2 (two) times daily.   BISOPROLOL FUMARATE PO Take 10 mg by mouth daily.   carbamazepine 200 MG 12 hr capsule Commonly known as:  CARBATROL Take 200 mg by mouth 2 (two) times daily.   CARTIA XT 300 MG 24 hr capsule Generic drug:  diltiazem Take 300 mg by mouth daily.   clobetasol 0.05 % Gel Commonly known as:  TEMOVATE Apply 1 application topically 2 (two) times daily.   donepezil 10 MG tablet Commonly known as:  ARICEPT Take 10 mg by mouth daily with supper.   esomeprazole 40 MG capsule Commonly known as:  NEXIUM Take 40 mg by mouth daily with supper.   folic acid 1 MG tablet Commonly known as:  FOLVITE Take 1 mg by mouth daily.   gabapentin 300 MG capsule Commonly known as:  NEURONTIN Take 300 mg by mouth 2 (two) times daily.   HUMALOG 100 UNIT/ML injection Generic drug:  insulin lispro INJECT 0.78ML (78UNITS) INTO THE SKIN VIA V-GO PUMP AS DIRECTED   Insulin Pen Needle 32G X 4 MM Misc Use 8 pen needles per day   Insulin Syringe-Needle U-100 31G X 15/64" 1 ML Misc Commonly known as:  BD SAFETYGLIDE INSULIN SYRINGE Use 1 syringe to inject insulin into V-Go pump daily   INVOKANA 300 MG Tabs tablet Generic drug:  canagliflozin TAKE ONE TABLET BY MOUTH DAILY BEFORE BREAKFAST   levothyroxine 75 MCG tablet Commonly known as:  SYNTHROID, LEVOTHROID Take 75 mcg by  mouth daily.   liraglutide 18 MG/3ML Sopn Commonly known as:  VICTOZA Inject 0.3 mLs (1.8 mg total) into the skin daily.   methotrexate 2.5 MG tablet Commonly known as:  RHEUMATREX Per  pt: takes 3 tablets every Monday   OLANZapine 5 MG tablet Commonly known as:  ZYPREXA Take 5 mg by mouth at bedtime.   ondansetron 8 MG tablet Commonly known as:  ZOFRAN Take 1 tablet (8 mg total) by mouth every 8 (eight) hours as needed for nausea.   ONETOUCH DELICA LANCETS 01V Misc USE LANCET TO TEST FIVE TIMES A DAY   ONETOUCH VERIO test strip Generic drug:  glucose blood USE TO TEST BLOOD SUGAR 3 TIMES DAILY   pravastatin 20 MG tablet Commonly known as:  PRAVACHOL Take 20 mg by mouth daily.   spironolactone 50 MG tablet Commonly known as:  ALDACTONE TAKE ONE TABLET BY MOUTH DAILY   V-GO 40 Kit USE ONCE DAILY AS DIRECTED   Vitamin D 2000 units tablet Take 2,000 Units by mouth daily with breakfast.       Allergies:  Allergies  Allergen Reactions  . Flagyl [Metronidazole Hcl] Itching and Rash  . Ciprofloxacin Itching and Rash  . Metformin And Related Rash  . Milk-Related Compounds Other (See Comments)    Stomach pains  . Other Other (See Comments)    [place] nuts cause severe facial redness    Past Medical History:  Diagnosis Date  . Depression    Bipolar  . Diabetes mellitus    diet controlled  . GERD (gastroesophageal reflux disease)   . Hyperlipidemia   . Hypertension   . Ischemic colitis, enteritis, or enterocolitis ([place])   . Sleep apnea   . Stroke ([place])    x's 2  . Thyroid disease   . Urinary bladder incontinence   . Vertigo     Past Surgical History:  Procedure Laterality Date  . ABDOMINAL HYSTERECTOMY     partial   . APPENDECTOMY    . CARDIAC CATHETERIZATION N/A 12/24/2014   Procedure: Right/Left Heart Cath and Coronary Angiography;  Surgeon: Sanda Klein, MD;  Location: [facility];  Service: Cardiovascular;  Laterality: N/A;  .  CHOLECYSTECTOMY    . FRACTURE SURGERY    . HEMORRHOID SURGERY    . RETINAL DETACHMENT SURGERY    . TONSILLECTOMY      Family History  Problem Relation Age of Onset  . Heart disease Mother   . Stroke Father   . Parkinson's disease Father   . Cancer Sister   . Cancer Brother     Social History:  reports that  has never smoked. she has never used smokeless tobacco. She reports that she does not drink alcohol or use drugs.    Review of Systems   She has had less edema and takes Lasix regularly now  NEUROPATHY: She has lower leg pain and takes gabapentin twice a day for this, however does not have any symptoms      Lipids: Last LDL from PCP was 101, followed by PCP also        Lab Results  Component Value Date   CHOL 175 08/17/2016   HDL 58.00 08/17/2016   LDLCALC 88 08/17/2016   TRIG 142.0 08/17/2016   CHOLHDL 3 08/17/2016       Thyroid:   Has had presumed hypothyroidism for over 20 years, has not had a change in medications in several years and last TSH was 1.9 in August from PCP   Lab Results  Component Value Date   TSH 1.32 12/19/2015   TSH 2.39 01/27/2015   TSH 1.90 08/21/2014   FREET4 0.86 01/31/2014   FREET4 0.91 08/28/2013  The blood pressure has been controlled with a combination of amlodipine and Benicar as well as hydralazine and bisoprolol      Physical Examination:  BP (!) 144/78 (Cuff Size: Large)   Pulse 70   Ht '5\' 3"'$  (1.6 m)   Wt 230 lb (104.3 kg)   SpO2 97%   BMI 40.74 kg/m      ASSESSMENT/PLAN:   Diabetes type 2, uncontrolled   See history of present illness for detailed discussion of his current management, blood sugar patterns and problems identified  Her blood sugars are Looking better now compared to her last visit This has improved mostly with her trying to change her V-go pump consistently around the same time every evening However not checking enough readings after meals and has only sporadic readings lately Blood  sugars maybe higher with eating cereal in the morning and discussed balancing all meals with some protein and not excessive carbohydrate  Other recommendations: She needs to check more readings after supper Also since she thinks she is eating more at lunch she can take about 12 units of cover her lunch and also reduce suppertime dose down to 10-12 units based on her meal size since her readings at night are about 100 She will let us know if she has more variability in her blood sugars but for now will continue the same regimen Also she can try and adjust her doses further with better postprandial monitoring  Follow-up in 3 months again   Patient Instructions  Check blood sugars on waking up  3/7  Also check blood sugars about 2 hours after a meal and do this after different meals by rotation  Recommended blood sugar levels on waking up is 90-130 and about 2 hours after meal is 130-160  Please bring your blood sugar monitor to each visit, thank you  Cut Spironloactone to 1/2 of '50mg'$     Elayne Snare 02/24/2017, 4:45 PM   Note: This office note was prepared with Dragon voice recognition system technology. Any transcriptional errors that result from this process are unintentional.                     Patient ID: [name], female   DOB: May 24, 1940, 77 y.o.   MRN: [id number]    Reason for Appointment: Followup for Type 2 Diabetes and swelling of legs  History of Present Illness:          Diagnosis: Type 2 diabetes mellitus, date of diagnosis:2011       Past history: Since she was apparently intolerant to metformin she was treated with Onglyza until about 2014  At that time because of increasing A1c of about 8% she was started on Levemir 15 units and this was aggressively increased Onglyza was continued She has not tried any other treatments for diabetes Her A1c has been 10-10.5 in 2015 In 3/15 she was told to start small doses of NovoLog at lunch and supper and  add 15 units of Levemir at night also To control  hyperglycemia with her basal bolus insulin regimen she was given Victoza in addition on her initial consultation in 5/15  Recent history:   INSULIN regimen :  V-go pump 40 unit basal, boluses at meal times: 6-6-8 units  She was changed on 09/19/14 to the V-go pump instead of basal bolus insulin shots She has been on Victoza since 8/15 and she is taking 1.2 mg With the V-go pump her blood sugars have improved overall and  are less variable  Current blood sugar patterns and problems:  Fasting blood sugars are very consistent, usually just above 100  She has stopped checking her blood sugars later in the day even though she was told to vary the times when she checks her sugar  She has no difficulty remembering to do her boluses with meals but usually does not change the amounts  She has improved her diet and is trying to  eliminate high glycemic index foods and juices  Recently trying to walk more regularly also  No hypoglycemia  She has no difficulty with using the V-go pump, doing the boluses when she is eating out and changing it every morning    Oral hypoglycemic drugs the patient is taking are: None    Side effects from medications have been: Metformin: rash  Glucose monitoring:  done 1 time a day         Glucometer:  One Touch.      Blood Glucose readings from home glucose meter download:  FASTING readings recently: 90-147, usually under 130.  Her monitor has incorrect date and time programmed   Glycemic control:   A1c was 7  from PCP in 8/16  Lab Results  Component Value Date   HGBA1C 7.2 (H) 02/22/2017   HGBA1C 7.7 (H) 11/18/2016   HGBA1C 7.8 (H) 08/17/2016   Lab Results  Component Value Date   MICROALBUR <0.7 03/22/2016   [place] 88 08/17/2016   CREATININE 1.03 02/22/2017    Self-care: The diet that the patient has been following is: tries to limit fats     Meals: 3 meals per day. Bfst oatmeal or egg/toast at  8-9 am, dinner 5 pm cutting back on frequent meals and  portions; no hs snacks           Exercise: Walks 15-20 min       Dietician visit: Most recent: 2014 ( class).               Compliance with the medical regimen: Good  Retinal exam: Most recent:.9/14    Weight history:  Wt Readings from Last 3 Encounters:  02/24/17 230 lb (104.3 kg)  02/24/17 230 lb (104.3 kg)  02/18/17 232 lb (105.2 kg)    Allergies as of 02/24/2017      Reactions   Flagyl [metronidazole Hcl] Itching, Rash   Ciprofloxacin Itching, Rash   Metformin And Related Rash   Milk-related Compounds Other (See Comments)   Stomach pains   Other Other (See Comments)   [place] nuts cause severe facial redness      Medication List        Accurate as of 02/24/17  4:45 PM. Always use your most recent med list.          aspirin 325 MG tablet Take 325 mg by mouth daily with breakfast.   betamethasone dipropionate 0.05 % cream Commonly known as:  DIPROLENE Apply 1 application topically 2 (two) times daily.   BISOPROLOL FUMARATE PO Take 10 mg by mouth daily.   carbamazepine 200 MG 12 hr capsule Commonly known as:  CARBATROL Take 200 mg by mouth 2 (two) times daily.   CARTIA XT 300 MG 24 hr capsule Generic drug:  diltiazem Take 300 mg by mouth daily.   clobetasol 0.05 % Gel Commonly known as:  TEMOVATE Apply 1 application topically 2 (two) times daily.   donepezil 10 MG tablet Commonly known as:  ARICEPT Take 10 mg by mouth daily with supper.  esomeprazole 40 MG capsule Commonly known as:  NEXIUM Take 40 mg by mouth daily with supper.   folic acid 1 MG tablet Commonly known as:  FOLVITE Take 1 mg by mouth daily.   gabapentin 300 MG capsule Commonly known as:  NEURONTIN Take 300 mg by mouth 2 (two) times daily.   HUMALOG 100 UNIT/ML injection Generic drug:  insulin lispro INJECT 0.78ML (78UNITS) INTO THE SKIN VIA V-GO PUMP AS DIRECTED   Insulin Pen Needle 32G X 4 MM Misc Use 8 pen needles per  day   Insulin Syringe-Needle U-100 31G X 15/64" 1 ML Misc Commonly known as:  BD SAFETYGLIDE INSULIN SYRINGE Use 1 syringe to inject insulin into V-Go pump daily   INVOKANA 300 MG Tabs tablet Generic drug:  canagliflozin TAKE ONE TABLET BY MOUTH DAILY BEFORE BREAKFAST   levothyroxine 75 MCG tablet Commonly known as:  SYNTHROID, LEVOTHROID Take 75 mcg by mouth daily.   liraglutide 18 MG/3ML Sopn Commonly known as:  VICTOZA Inject 0.3 mLs (1.8 mg total) into the skin daily.   methotrexate 2.5 MG tablet Commonly known as:  RHEUMATREX Per pt: takes 3 tablets every Monday   OLANZapine 5 MG tablet Commonly known as:  ZYPREXA Take 5 mg by mouth at bedtime.   ondansetron 8 MG tablet Commonly known as:  ZOFRAN Take 1 tablet (8 mg total) by mouth every 8 (eight) hours as needed for nausea.   ONETOUCH DELICA LANCETS 29F Misc USE LANCET TO TEST FIVE TIMES A DAY   ONETOUCH VERIO test strip Generic drug:  glucose blood USE TO TEST BLOOD SUGAR 3 TIMES DAILY   pravastatin 20 MG tablet Commonly known as:  PRAVACHOL Take 20 mg by mouth daily.   spironolactone 50 MG tablet Commonly known as:  ALDACTONE TAKE ONE TABLET BY MOUTH DAILY   V-GO 40 Kit USE ONCE DAILY AS DIRECTED   Vitamin D 2000 units tablet Take 2,000 Units by mouth daily with breakfast.       Allergies:  Allergies  Allergen Reactions  . Flagyl [Metronidazole Hcl] Itching and Rash  . Ciprofloxacin Itching and Rash  . Metformin And Related Rash  . Milk-Related Compounds Other (See Comments)    Stomach pains  . Other Other (See Comments)    [place] nuts cause severe facial redness    Past Medical History:  Diagnosis Date  . Depression    Bipolar  . Diabetes mellitus    diet controlled  . GERD (gastroesophageal reflux disease)   . Hyperlipidemia   . Hypertension   . Ischemic colitis, enteritis, or enterocolitis ([place])   . Sleep apnea   . Stroke ([place])    x's 2  . Thyroid disease   . Urinary bladder  incontinence   . Vertigo     Past Surgical History:  Procedure Laterality Date  . ABDOMINAL HYSTERECTOMY     partial   . APPENDECTOMY    . CARDIAC CATHETERIZATION N/A 12/24/2014   Procedure: Right/Left Heart Cath and Coronary Angiography;  Surgeon: Sanda Klein, MD;  Location: [facility];  Service: Cardiovascular;  Laterality: N/A;  . CHOLECYSTECTOMY    . FRACTURE SURGERY    . HEMORRHOID SURGERY    . RETINAL DETACHMENT SURGERY    . TONSILLECTOMY      Family History  Problem Relation Age of Onset  . Heart disease Mother   . Stroke Father   . Parkinson's disease Father   . Cancer Sister   . Cancer Brother  Social History:  reports that  has never smoked. she has never used smokeless tobacco. She reports that she does not drink alcohol or use drugs.    Review of Systems      The blood pressure has been controlled with a combination of Bisoprolol, diltiazem  as well as Aldactone.  Also on Invokana 300 mg  She had a blood pressure of 84 diastolic with her PCP However she thinks her blood pressure is always about 120/70 at home  Benicar previously stopped because of ?  Diarrhea  However recently appears to have a relatively low sodium and a potassium level that is higher than usual  Lab Results  Component Value Date   CREATININE 1.03 02/22/2017   BUN 13 02/22/2017   NA 132 (L) 02/22/2017   K 5.3 (H) 02/22/2017   CL 97 02/22/2017   CO2 28 02/22/2017    NEUROPATHY: She has lower leg pain and takes gabapentin twice a day  LIPIDS: Appear controlled,Treatment regimen followed by PCP Had labs in 1/19 with PCP showing good results       Lab Results  Component Value Date   CHOL 175 08/17/2016   HDL 58.00 08/17/2016   LDLCALC 88 08/17/2016   TRIG 142.0 08/17/2016   CHOLHDL 3 08/17/2016       Thyroid:    Has had presumed hypothyroidism for over 20 years, has not had a change in medications in several years and last TSH was Normal in 09/2016   Lab  Results  Component Value Date   TSH 1.32 12/19/2015   TSH 2.39 01/27/2015   TSH 1.90 08/21/2014   FREET4 0.86 01/31/2014   FREET4 0.91 08/28/2013    Depression: She is still taking Zyprexa     Physical Examination:  BP (!) 144/78 (Cuff Size: Large)   Pulse 70   Ht 5' 3"  (1.6 m)   Wt 230 lb (104.3 kg)   SpO2 97%   BMI 40.74 kg/m    ASSESSMENT/PLAN:   Diabetes type 2, uncontrolled   See history of present illness for detailed discussion of current  management, blood sugar patterns and problems identified.  She is on a regimen of the V-go pump and also Invokana and Victoza  Recent A1c is 7.2  With her going to see the dietitian and doing better with her meal planning as well as doing some walking on her blood sugars are improving and she has lost some weight But she has however not check readings after meals as before and this was again discussed in detail Discussed blood sugar targets and need for adjusting mealtime dose based on postprandial patterns  Recommended the following:  Continue taking VICTOZA 1.8 mg in the morning  She will need to take her blood sugars after 1 of her meals at least once a day  Continue balanced meals  Continue Invokana  If she has higher readings after any of her meals she will take extra boluses and adjust also based on her carbohydrate intake  HYPERKALEMIA and slightly low sodium: This is likely to be from taking spironolactone and she will cut the dose in half for now and follow-up in 2 weeks for lab work  Hypertension: She will continue to monitor at home and follow-up with PCP Consider adding low-dose losartan if blood pressure is still high  Patient Instructions  Check blood sugars on waking up  3/7  Also check blood sugars about 2 hours after a meal and do this after different meals  by rotation  Recommended blood sugar levels on waking up is 90-130 and about 2 hours after meal is 130-160  Please bring your blood sugar  monitor to each visit, thank you  Cut Spironloactone to 1/2 of 1m     AElayne Snare2/07/2017, 4:45 PM   Note: This office note was prepared with Dragon voice recognition system technology. Any transcriptional errors that result from this process are unintentional.

## 2017-03-02 ENCOUNTER — Ambulatory Visit (HOSPITAL_BASED_OUTPATIENT_CLINIC_OR_DEPARTMENT_OTHER): Payer: Medicare Other | Attending: Pulmonary Disease | Admitting: Radiology

## 2017-03-02 DIAGNOSIS — G4733 Obstructive sleep apnea (adult) (pediatric): Secondary | ICD-10-CM

## 2017-03-10 ENCOUNTER — Other Ambulatory Visit: Payer: Self-pay | Admitting: Cardiovascular Disease

## 2017-03-10 ENCOUNTER — Other Ambulatory Visit (INDEPENDENT_AMBULATORY_CARE_PROVIDER_SITE_OTHER): Payer: Medicare Other

## 2017-03-10 DIAGNOSIS — E875 Hyperkalemia: Secondary | ICD-10-CM

## 2017-03-10 LAB — POTASSIUM: POTASSIUM: 4.3 meq/L (ref 3.5–5.1)

## 2017-03-10 NOTE — Progress Notes (Signed)
Please call to let patient know that the lab results are normal and no further action needed

## 2017-03-10 NOTE — Telephone Encounter (Signed)
REFILL 

## 2017-03-22 ENCOUNTER — Other Ambulatory Visit: Payer: Self-pay | Admitting: Endocrinology

## 2017-03-26 ENCOUNTER — Emergency Department (HOSPITAL_COMMUNITY)
Admission: EM | Admit: 2017-03-26 | Discharge: 2017-03-26 | Disposition: A | Discharge status: Home / Self Care | Payer: Medicare Other | Attending: Emergency Medicine | Admitting: Emergency Medicine

## 2017-03-26 ENCOUNTER — Encounter (HOSPITAL_COMMUNITY): Payer: Self-pay | Admitting: Emergency Medicine

## 2017-03-26 ENCOUNTER — Emergency Department (HOSPITAL_COMMUNITY): Payer: Medicare Other

## 2017-03-26 DIAGNOSIS — E119 Type 2 diabetes mellitus without complications: Secondary | ICD-10-CM | POA: Insufficient documentation

## 2017-03-26 DIAGNOSIS — Y999 Unspecified external cause status: Secondary | ICD-10-CM | POA: Diagnosis not present

## 2017-03-26 DIAGNOSIS — Z794 Long term (current) use of insulin: Secondary | ICD-10-CM | POA: Diagnosis not present

## 2017-03-26 DIAGNOSIS — I213 ST elevation (STEMI) myocardial infarction of unspecified site: Secondary | ICD-10-CM

## 2017-03-26 DIAGNOSIS — Z7982 Long term (current) use of aspirin: Secondary | ICD-10-CM | POA: Diagnosis not present

## 2017-03-26 DIAGNOSIS — W19XXXA Unspecified fall, initial encounter: Secondary | ICD-10-CM | POA: Diagnosis not present

## 2017-03-26 DIAGNOSIS — Y92009 Unspecified place in unspecified non-institutional (private) residence as the place of occurrence of the external cause: Secondary | ICD-10-CM | POA: Insufficient documentation

## 2017-03-26 DIAGNOSIS — T148XXA Other injury of unspecified body region, initial encounter: Secondary | ICD-10-CM

## 2017-03-26 DIAGNOSIS — Z79899 Other long term (current) drug therapy: Secondary | ICD-10-CM | POA: Diagnosis not present

## 2017-03-26 DIAGNOSIS — I10 Essential (primary) hypertension: Secondary | ICD-10-CM | POA: Diagnosis not present

## 2017-03-26 DIAGNOSIS — R079 Chest pain, unspecified: Secondary | ICD-10-CM

## 2017-03-26 DIAGNOSIS — Z7902 Long term (current) use of antithrombotics/antiplatelets: Secondary | ICD-10-CM | POA: Diagnosis not present

## 2017-03-26 DIAGNOSIS — Y9389 Activity, other specified: Secondary | ICD-10-CM | POA: Insufficient documentation

## 2017-03-26 LAB — CBC WITH DIFFERENTIAL/PLATELET
Basophils Absolute: 0 10*3/uL (ref 0.0–0.1)
Basophils Relative: 0 %
Eosinophils Absolute: 0.2 10*3/uL (ref 0.0–0.7)
Eosinophils Relative: 1 %
HCT: 39.8 % (ref 36.0–46.0)
Hemoglobin: 12.6 g/dL (ref 12.0–15.0)
Lymphocytes Relative: 20 %
Lymphs Abs: 2.1 10*3/uL (ref 0.7–4.0)
MCH: 25.2 pg — ABNORMAL LOW (ref 26.0–34.0)
MCHC: 31.7 g/dL (ref 30.0–36.0)
MCV: 79.6 fL (ref 78.0–100.0)
Monocytes Absolute: 1.2 10*3/uL — ABNORMAL HIGH (ref 0.1–1.0)
Monocytes Relative: 12 %
Neutro Abs: 7 10*3/uL (ref 1.7–7.7)
Neutrophils Relative %: 67 %
Platelets: 393 10*3/uL (ref 150–400)
RBC: 5 MIL/uL (ref 3.87–5.11)
RDW: 15.4 % (ref 11.5–15.5)
WBC: 10.4 10*3/uL (ref 4.0–10.5)

## 2017-03-26 LAB — COMPREHENSIVE METABOLIC PANEL
ALT: 19 U/L (ref 14–54)
AST: 24 U/L (ref 15–41)
Albumin: 3.5 g/dL (ref 3.5–5.0)
Alkaline Phosphatase: 86 U/L (ref 38–126)
Anion gap: 11 (ref 5–15)
BILIRUBIN TOTAL: 0.2 mg/dL — AB (ref 0.3–1.2)
BUN: 14 mg/dL (ref 6–20)
CO2: 24 mmol/L (ref 22–32)
CREATININE: 0.9 mg/dL (ref 0.44–1.00)
Calcium: 9.3 mg/dL (ref 8.9–10.3)
Chloride: 100 mmol/L — ABNORMAL LOW (ref 101–111)
Glucose, Bld: 135 mg/dL — ABNORMAL HIGH (ref 65–99)
POTASSIUM: 4.4 mmol/L (ref 3.5–5.1)
Sodium: 135 mmol/L (ref 135–145)
TOTAL PROTEIN: 7.1 g/dL (ref 6.5–8.1)

## 2017-03-26 LAB — I-STAT TROPONIN, ED
Troponin i, poc: 0.01 ng/mL (ref 0.00–0.08)
Troponin i, poc: 0 ng/mL (ref 0.00–0.08)

## 2017-03-26 LAB — LIPID PANEL
Cholesterol: 149 mg/dL (ref 0–200)
HDL: 50 mg/dL (ref >40)
LDL Cholesterol: 73 mg/dL (ref 0–99)
Total CHOL/HDL Ratio: 3 RATIO
Triglycerides: 130 mg/dL (ref <150)
VLDL: 26 mg/dL (ref 0–40)

## 2017-03-26 LAB — PROTIME-INR
INR: 1.03
Prothrombin Time: 13.4 seconds (ref 11.4–15.2)

## 2017-03-26 LAB — APTT: aPTT: 33 seconds (ref 24–36)

## 2017-03-26 LAB — TROPONIN I

## 2017-03-26 MED ORDER — OXYCODONE-ACETAMINOPHEN 5-325 MG PO TABS
1.0000 | ORAL_TABLET | Freq: Once | ORAL | Status: AC
  Administered 2017-03-26: 1 via ORAL
  Filled 2017-03-26: qty 1

## 2017-03-26 MED ORDER — NITROGLYCERIN IN D5W 200-5 MCG/ML-% IV SOLN
0.0000 ug/min | Freq: Once | INTRAVENOUS | Status: DC
  Filled 2017-03-26: qty 250

## 2017-03-26 MED ORDER — ASPIRIN 81 MG PO CHEW
324.0000 mg | CHEWABLE_TABLET | Freq: Once | ORAL | Status: AC
  Administered 2017-03-26: 324 mg via ORAL

## 2017-03-26 MED ORDER — HEPARIN SODIUM (PORCINE) 5000 UNIT/ML IJ SOLN
4000.0000 [IU] | Freq: Once | INTRAMUSCULAR | Status: AC
  Administered 2017-03-26: 4000 [IU] via INTRAVENOUS
  Filled 2017-03-26: qty 0.8

## 2017-03-26 MED ORDER — ASPIRIN 81 MG PO CHEW
CHEWABLE_TABLET | ORAL | Status: AC
  Filled 2017-03-26: qty 4

## 2017-03-26 MED ORDER — SODIUM CHLORIDE 0.9 % IV SOLN: INTRAVENOUS | Status: DC

## 2017-03-26 NOTE — ED Notes (Signed)
CODE STEMI cancelled by Dr Lajuana Mattearnichelli.

## 2017-03-26 NOTE — ED Notes (Signed)
Bed: RESB Expected date:  Expected time:  Means of arrival:  Comments: 

## 2017-03-26 NOTE — ED Provider Notes (Signed)
  Physical Exam  BP (!) 147/80   Pulse 83   Temp 100.2 F (37.9 C) (Oral)   Resp 17   SpO2 96%   Physical Exam Tenderness over left trapezius and left posterior shoulder.  Good range of motion shoulder.  Will discharge home ED Course/Procedures   Clinical Course as of Mar 26 2112  Sat Mar 26, 2017  1705 She reports onset of left upper chest pain radiating to left arm, earlier this morning, persistent and worsening.  No prior similar problems.  No shortness of breath.  She feels generally weak.  [EW]  1709 Code STEMI activated  [EW]  4932 Case discussed with on-call cardiologist, arrangements made for transfer to: Hospital by EMS. [facility] is not currently available.  [EW]    Clinical Course User Index [EW] Daleen Bo, MD    Procedures  MDM  Patient came from was a long as a code STEMI.  Met by myself and cardiology.  Patient reportedly had fall out of bed and complaining of pain in her left shoulder.  Goes to the left arm.  No neck pain.  EKG stable from prior.  Cardiology suggested a repeat troponin and if this is negative can be discharged home.  Negative troponin repeated.  To follow-up with PCP as needed.  Patient has had heart catheterizations and negative recent provocative testing per cardiology.        Davonna Belling, MD 03/26/17 2114

## 2017-03-26 NOTE — Progress Notes (Signed)
Cardiology Moonlighter Note  Called by [employer] for Code STEMI. [name] is a 77 y.o. female w/ multiple cardiac risk factors (HTN, HLD, obesity, age, DM2) who presents with L shoulder pain. Woke up feeling well this morning. She rolled over in bed and accidentally fell out of bed, landing on her left shoulder. Since that time she has had severe L shoulder pain limiting her range of motion. This pain has been constant, unrelated to exertion, worsened by movement of the L arm, and non-radiating. She denies symptoms of dyspnea, chest pain, chest pressure, dizziness, lightheadedness, palpitations. [facility] and an ECG was obtained, which was concerning for inferior STEMI. A code STEMI was called and she was brought to the [facility].  On arrival [facility], the patient reported continued L shoulder pain. The pain was non-radiating and she denied any other associated symptoms, including chest pain or chest pressure. She was modestly hypertensive (systolic pressures in the 160's), but otherwise had normal vital signs. Notable physical exam findings included obesity, no JVD, no external signs of trauma to the L shoulder, RRR with no obvious cardiac murmurs (but distant heart sounds), and clear lungs. [facility] revealed a normal CBC, normal BMP, and a negative troponin (both serum troponin I and I-stat). A repeat ECG revealed no evidence of ST elevation, but some nonspecific ST depressions and T wave inversions that were similar when compared to her prior ECG from several years ago.   Also of note the patient underwent coronary angiography in 2016 for escalating symptoms of chest discomfort and shortness of breath. She was found to have normal coronary arteries at that time. The patient currently does not recall the symptoms that led to her undergoing this procedure, but again denies any current symptoms of SOB or chest pain/pressure.   This case was discussed with  the on call interventionalist, Dr. Tresa EndoKelly. The code STEMI was cancelled. The patient was offered reassurance that it is unlikely that her symptoms are due to acute coronary syndrome and far more likely that her symptoms are due to her traumatic fall from bed. We will obtain 1 more cardiac troponin. If this is negative, there is no cardiac reason to pursue any further cardiac workup or treatment. Further management of the patient's pain and cormorbidities per the treating ED team.  This plan has been discussed with Dr. Tresa EndoKelly, the ED provider, and the patient, all of whom are in agreement.   Mary JacksAnthony Philip Maynard David, MD Cardiology Fellow, PGY-5

## 2017-03-26 NOTE — ED Notes (Signed)
EMS at bedside to transport pt STAT per Dr Effie ShyWentz

## 2017-03-26 NOTE — ED Notes (Signed)
Patient left at this time with all belongings. 

## 2017-03-26 NOTE — ED Provider Notes (Signed)
[facility] EMERGENCY DEPARTMENT Provider Note   CSN: [id number] Arrival date & time: 03/26/17  1700     History   Chief Complaint Chief Complaint  Patient presents with  . Code STEMI    HPI [name] is a 77 y.o. female.  She presents for evaluation of left upper chest shoulder and left arm pain which started this morning around 8 AM.  The pain has persisted and worsened.  She has not had anything like this previously.  She says she has a cardiac condition but is unable to specify what it is.  She came here by private vehicle.  Level 5 caveat-severe illness  HPI  Past Medical History:  Diagnosis Date  . Depression    Bipolar  . Diabetes mellitus    diet controlled  . GERD (gastroesophageal reflux disease)   . Hyperlipidemia   . Hypertension   . Ischemic colitis, enteritis, or enterocolitis ([place])   . Sleep apnea   . Stroke ([place])    x's 2  . Thyroid disease   . Urinary bladder incontinence   . Vertigo     Patient Active Problem List   Diagnosis Date Noted  . OSA (obstructive sleep apnea) 02/18/2017  . Dyspnea 01/27/2015  . Atypical chest pain 12/24/2014  . SOB (shortness of breath)   . Accelerating angina ([place]) 12/23/2014  . Chest pain at rest 10/30/2013  . Exertional dyspnea 10/30/2013  . Hyperlipidemia 10/30/2013  . Morbid obesity ([place]) 10/30/2013  . Acquired autoimmune hypothyroidism 09/02/2013  . Type II or unspecified type diabetes mellitus without mention of complication, uncontrolled 06/14/2013  . Essential hypertension, benign 06/14/2013  . Hyperlipemia 06/14/2013    Past Surgical History:  Procedure Laterality Date  . ABDOMINAL HYSTERECTOMY     partial   . APPENDECTOMY    . CARDIAC CATHETERIZATION N/A 12/24/2014   Procedure: Right/Left Heart Cath and Coronary Angiography;  Surgeon: Sanda Klein, MD;  Location: [facility];  Service: Cardiovascular;  Laterality: N/A;  . CHOLECYSTECTOMY    . FRACTURE SURGERY      . HEMORRHOID SURGERY    . RETINAL DETACHMENT SURGERY    . TONSILLECTOMY      OB History    No data available       Home Medications    Prior to Admission medications   Medication Sig Start Date End Date Taking? Authorizing Provider  aspirin 325 MG tablet Take 325 mg by mouth daily with breakfast.     [provider]  betamethasone dipropionate (DIPROLENE) 0.05 % cream Apply 1 application topically 2 (two) times daily.    [provider]  BISOPROLOL FUMARATE PO Take 10 mg by mouth daily.    [provider]  carbamazepine (CARBATROL) 200 MG 12 hr capsule Take 200 mg by mouth 2 (two) times daily.     [provider]  Cholecalciferol (VITAMIN D) 2000 UNITS tablet Take 2,000 Units by mouth daily with breakfast.     [provider]  clobetasol (TEMOVATE) 0.05 % GEL Apply 1 application topically 2 (two) times daily.    [provider]  diltiazem (CARTIA XT) 300 MG 24 hr capsule Take 300 mg by mouth daily.    [provider]  donepezil (ARICEPT) 10 MG tablet Take 10 mg by mouth daily with supper.     [provider]  esomeprazole (NEXIUM) 40 MG capsule Take 40 mg by mouth daily with supper.    [provider]  folic  acid (FOLVITE) 1 MG tablet Take 1 mg by mouth daily.    [provider]  gabapentin (NEURONTIN) 300 MG capsule Take 300 mg by mouth 2 (two) times daily.    [provider]  HUMALOG 100 UNIT/ML injection INJECT 0.78ML (78UNITS) INTO THE SKIN VIA V-GO PUMP AS DIRECTED 11/22/16   Elayne Snare, MD  Insulin Disposable Pump (V-GO 40) KIT USE ONCE DAILY AS DIRECTED 03/22/17   Elayne Snare, MD  Insulin Pen Needle 32G X 4 MM MISC Use 8 pen needles per day 02/08/17   Elayne Snare, MD  Insulin Syringe-Needle U-100 (BD INSULIN SYRINGE SAFETYGLIDE) 31G X 15/64" 1 ML MISC Use 1 syringe to inject insulin into V-Go pump daily 08/20/16   Elayne Snare, MD  INVOKANA 300 MG TABS tablet TAKE ONE TABLET BY MOUTH  DAILY BEFORE BREAKFAST 12/15/16   Elayne Snare, MD  levothyroxine (SYNTHROID, LEVOTHROID) 75 MCG tablet Take 75 mcg by mouth daily.      [provider]  liraglutide (VICTOZA) 18 MG/3ML SOPN Inject 0.3 mLs (1.8 mg total) into the skin daily. 02/24/17   Elayne Snare, MD  methotrexate (RHEUMATREX) 2.5 MG tablet Per pt: takes 3 tablets every Monday    [provider]  OLANZapine (ZYPREXA) 5 MG tablet Take 5 mg by mouth at bedtime.    [provider]  ondansetron (ZOFRAN) 8 MG tablet Take 1 tablet (8 mg total) by mouth every 8 (eight) hours as needed for nausea. 05/08/12   Dorie Rank, MD  [facility] DELICA LANCETS 00P MISC USE LANCET TO TEST FIVE TIMES A DAY 01/19/17   Elayne Snare, MD  [facility] VERIO test strip USE TO TEST BLOOD SUGAR 3 TIMES DAILY 12/08/16   Elayne Snare, MD  pravastatin (PRAVACHOL) 20 MG tablet Take 20 mg by mouth daily.    [provider]  spironolactone (ALDACTONE) 50 MG tablet Take 1 tablet (50 mg total) by mouth daily. NEED OV. 03/10/17   Croitoru, Dani Gobble, MD    Family History Family History  Problem Relation Age of Onset  . Heart disease Mother   . Stroke Father   . Parkinson's disease Father   . Cancer Sister   . Cancer Brother     Social History Social History   Tobacco Use  . Smoking status: Never Smoker  . Smokeless tobacco: Never Used  Substance Use Topics  . Alcohol use: No  . Drug use: No     Allergies   Flagyl [metronidazole hcl]; Ciprofloxacin; Metformin and related; Milk-related compounds; and Other   Review of Systems Review of Systems  Unable to perform ROS: Acuity of condition     Physical Exam Updated Vital Signs BP (!) 169/87 (BP Location: Left Arm)   Pulse 82   Temp 98.5 F (36.9 C) (Oral)   Resp (!) 24   SpO2 94%   Physical Exam  Constitutional: She is oriented to person, place, and time. She appears well-developed and well-nourished. She appears distressed (Uncomfortable, states pain 8/10.).  HENT:    Head: Normocephalic and atraumatic.  Eyes: Conjunctivae and EOM are normal. Pupils are equal, round, and reactive to light.  Neck: Normal range of motion and phonation normal. Neck supple.  Cardiovascular: Normal rate and regular rhythm.  Pulmonary/Chest: Effort normal and breath sounds normal. No respiratory distress. She has no wheezes. She exhibits no tenderness.  Abdominal: Soft. She exhibits no distension. There is no tenderness. There is no guarding.  Musculoskeletal: Normal range of motion. She exhibits edema (2-3+ bilateral  lower extremities.). She exhibits no tenderness.  Neurological: She is alert and oriented to person, place, and time. She exhibits normal muscle tone.  Skin: Skin is warm and dry.  Psychiatric: She has a normal mood and affect. Her behavior is normal. Judgment and thought content normal.  Nursing note and vitals reviewed.    ED Treatments / Results  Labs (all labs ordered are listed, but only abnormal results are displayed) Labs Reviewed  CBC WITH DIFFERENTIAL/PLATELET - Abnormal; Notable for the following components:      Result Value   MCH 25.2 (*)    Monocytes Absolute 1.2 (*)    All other components within normal limits  PROTIME-INR  APTT  COMPREHENSIVE METABOLIC PANEL  TROPONIN I  LIPID PANEL  I-STAT TROPONIN, ED    EKG  EKG Interpretation  Date/Time:  Saturday March 26 2017 17:01:04 EST Ventricular Rate:  93 PR Interval:    QRS Duration: 80 QT Interval:  329 QTC Calculation: 410 R Axis:   -63 Text Interpretation:  Sinus rhythm Abnormal R-wave progression, late transition Inferior infarct, acute (RCA) Probable RV involvement, suggest recording right precordial leads Since last tracing ST elevation is new Confirmed by Daleen Bo [phone]) on 03/26/2017 5:05:03 PM       Radiology No results found.  Procedures .Critical Care Performed by: Daleen Bo, MD Authorized by: Daleen Bo, MD   Critical care provider statement:     Critical care time (minutes):  35   Critical care start time:  03/26/2017 5:04 PM   Critical care end time:  03/26/2017 5:39 PM   Critical care time was exclusive of:  Separately billable procedures and treating other patients   Critical care was necessary to treat or prevent imminent or life-threatening deterioration of the following conditions:  Cardiac failure   Critical care was time spent personally by me on the following activities:  Blood draw for specimens, development of treatment plan with patient or surrogate, discussions with consultants, evaluation of patient's response to treatment, examination of patient, obtaining history from patient or surrogate, ordering and performing treatments and interventions, ordering and review of laboratory studies, pulse oximetry, re-evaluation of patient's condition, review of old charts and ordering and review of radiographic studies   (including critical care time)  Medications Ordered in ED Medications  0.9 %  sodium chloride infusion (not administered)  aspirin 81 MG chewable tablet (not administered)  nitroGLYCERIN 50 mg in dextrose 5 % 250 mL (0.2 mg/mL) infusion (not administered)  aspirin chewable tablet 324 mg (324 mg Oral Given 03/26/17 1713)  heparin injection 4,000 Units (4,000 Units Intravenous Given 03/26/17 1716)     Initial Impression / Assessment and Plan / ED Course  I have reviewed the triage vital signs and the nursing notes.  Pertinent labs & imaging results that were available during my care of the patient were reviewed by me and considered in my medical decision making (see chart for details).  Clinical Course as of Mar 26 1740  Sat Mar 26, 2017  1705 She reports onset of left upper chest pain radiating to left arm, earlier this morning, persistent and worsening.  No prior similar problems.  No shortness of breath.  She feels generally weak.  [EW]  1709 Code STEMI activated  [EW]  6213 Case discussed with on-call cardiologist,  arrangements made for transfer to: Hospital by EMS. [facility] is not currently available.  [EW]    Clinical Course User Index [EW] Daleen Bo, MD    Patient Vitals  for the past 24 hrs:  BP Temp Temp src Pulse Resp SpO2  03/26/17 1701 (!) 169/87 98.5 F (36.9 C) Oral - (!) 24 94 %  03/26/17 1700 - - - 82 - -      Final Clinical Impressions(s) / ED Diagnoses   Final diagnoses:  ST elevation myocardial infarction (STEMI), unspecified artery ([place])   Patient with chest and left arm pain, EKG abnormality indicates STEMI.  Initial troponin normal.  Moderate hypertension present.  Patient taken urgently for cardiac assessment and likely cardiac catheterization.  Nursing Notes Reviewed/ Care Coordinated Applicable Imaging Reviewed Interpretation of Laboratory Data incorporated into ED treatment   Plan: [facility]  ED Discharge Orders    None       Daleen Bo, MD 03/26/17 1746

## 2017-03-26 NOTE — ED Notes (Signed)
Pt transported by [facility] to [facility]

## 2017-03-26 NOTE — ED Notes (Signed)
Patient transported to X-ray 

## 2017-03-26 NOTE — ED Triage Notes (Signed)
Pt c/o left chest pain that started around 9am today that radiates to left arm. Having SOB with it. Pt states that she woke up and turned and fell out of bed with no LOC. Takes ASA daily.

## 2017-03-26 NOTE — ED Notes (Signed)
Repeat Istat trop drawn

## 2017-03-26 NOTE — ED Notes (Signed)
Pt returned from xray, provided cell phone to reach out to family. Pt reports oral meds did not help pain to L anterior chest radiating through to scapula,

## 2017-03-26 NOTE — ED Notes (Signed)
[facility] for CODE stemi.  Given 340 asa and 4000 units heparin before leaving [facility].  Pain began when pt fell out of bed onto her L scapula.  Pain to L scapula that radiates down L arm and causes numbness to L fingertips.

## 2017-03-26 NOTE — ED Notes (Signed)
Pt in radiology at this time. 

## 2017-03-26 NOTE — ED Notes (Signed)
All of my efforts have been to establish IV's, of which two were initiated in E.D. She tells us that she has been experiencing left shoulder area discomfort since ~1000 hours today, which began at her awakening, thence fell out of bed. She is pale and her skin is warm and dry. She is in no distress. She has just left our dep't. Per [facility] to [facility] Lab. 4000 unit Heparin bolus given IV and 325mg  baby asa given p.o. Prior to transport. Dr. Tresa EndoKelly is to perform cath.

## 2017-03-28 ENCOUNTER — Telehealth: Payer: Self-pay | Admitting: Cardiovascular Disease

## 2017-03-28 NOTE — Telephone Encounter (Signed)
New Message   Patient is calling because she was recently in the emergency room on Saturday the 9th. She first went to [facility] and they told her she was having a heart attack. They then transferred [facility] to [facility] and [facility] released her saying it had corrected it self. She would like for Dr. Jomarie Longsroituru to review the notes of both locations. In the meantime she has made an appointment with the PA for 04/19/2017.  Please call to discuss.

## 2017-03-28 NOTE — Telephone Encounter (Signed)
Left a message to call back.

## 2017-03-28 NOTE — Telephone Encounter (Signed)
Follow up    [facility]

## 2017-03-28 NOTE — Telephone Encounter (Signed)
Returned the call to the patient. [name] would like to know if Dr. Royann Shiversroitoru could review her EKG and labs from her recent ED visit [facility] and then [facility].

## 2017-03-30 NOTE — Telephone Encounter (Signed)
Patient has been made aware of Dr. Erin Hearing interpretation and has verbalized her understanding.

## 2017-03-30 NOTE — Telephone Encounter (Signed)
Reviewed ECG - no change since 2016 and labs- no sign of acute MI. MCr

## 2017-04-14 ENCOUNTER — Encounter: Payer: Self-pay | Admitting: Endocrinology

## 2017-04-19 ENCOUNTER — Encounter: Payer: Self-pay | Admitting: Physician Assistant

## 2017-04-19 ENCOUNTER — Ambulatory Visit (INDEPENDENT_AMBULATORY_CARE_PROVIDER_SITE_OTHER): Payer: Medicare Other | Admitting: Physician Assistant

## 2017-04-19 VITALS — BP 118/60 | HR 83 | Ht 63.0 in | Wt 229.8 lb

## 2017-04-19 DIAGNOSIS — R079 Chest pain, unspecified: Secondary | ICD-10-CM | POA: Diagnosis not present

## 2017-04-19 DIAGNOSIS — R0609 Other forms of dyspnea: Secondary | ICD-10-CM

## 2017-04-19 DIAGNOSIS — I10 Essential (primary) hypertension: Secondary | ICD-10-CM

## 2017-04-19 DIAGNOSIS — R0600 Dyspnea, unspecified: Secondary | ICD-10-CM

## 2017-04-19 NOTE — Progress Notes (Signed)
    Cardiology Office Note Entered in error  Date:  04/19/2017   ID:  [name], DOB 07/04/1940, MRN [id number]  PCP:  Georgianne Fickamachandran, Ajith, MD  Cardiologist: Dr. Royann Shiversroitoru, 08/04/2015 Theodore Demarkhonda Grant Swager, PA-C 04/26/2016  No chief complaint on file.   History of Present Illness: [name] is a 77 y.o. female with a history of DM, HTN, TIA, obesity, ischemic colitis, nl MV 2012, normal cath 2016  03/26/2017 ER visit for chest pain, pain was in the left shoulder area after a fall out of bed.  Initial ECG read as acute MI, but repeat ECG showed only sinus rhythm, enzymes negative  [name] presents for cardiology follow up and evaluation.

## 2017-04-19 NOTE — Progress Notes (Signed)
Cardiology Office Note   Date:  04/19/2017   ID:  [name], DOB [birth year], MRN [id number]  PCP:  Merrilee Seashore, MD  Cardiologist:  Dr. Chriss Czar Barrett, PA-C   No chief complaint on file.   History of Present Illness: [name] is a 77 y.o. female with a history of DM, HTN, HLD, TIA, obesity, ischemic colitis, R&L heart cath in 2016 with normal coronary arteries and normal Myoview in 2015.   Patient was recently discharged from ED on 03/26/17 that was initially called for a code STEMI. Patient presented that morning with left chest/shoulder pain after waking up and falling out of bed. Labs revealed negative troponin x 3. Repeat EKG revealed no evidence of ST elevation, but some non-specific ST depressions and T wave inversions that were similar to prior EKG from 2016. Dr. Claiborne Billings and Dr. Sallyanne Kuster were both consulted and agreed that no further management was needed.   [name] presents today for cardiology follow-up evaluation.   Patient states that she has been experiencing chest pain for the past couple of years, with increasing frequency in the last 6 months. She describes the chest pain as an 8/10 excruciating substernal tightness and pressure that is non-radiating or painful to palpation, but is worse with deep breaths. They used to occur once a week, but in the last few months have been occurring twice weekly. She is unable to correlate the chest pain events occurring with certain activities, but reports that it can happen with activity or at rest. States that the episodes usually last 30 minutes in duration and go away if she relaxes, lays down, or takes a SL nitro. Endorses associated shortness of breath but denies any lightheadedness, dizziness, syncope, numbness/tingling, N/V, or diaphoresis. Denies any recent stressors in her life. Does not use tobacco, EtOH or illicit drugs.   Denies orthopnea or PND, and reports chronic LEE that is currently  at her baseline. Compliant with Spironolactone. She has recently seen a nutritionist for her DM and has been limiting her sodium intake, but does admit eating most of her meals out.   Patient reports that the chest pain she is experiencing now is the same as before when she had the Myoview in 2015 and L&R heart cath in 2016. Both will normal results.   She also reports that the chest pain now is not similar to what she was experiencing during her recent ED visit on 03/26/17, which was primarily secondary to left shoulder pain/numbness/tingling. Currently, she has regained most of her active ROM but still complains of a dull ache that she occasionally takes Tylenol for.  Patient states that her activity level is limited, but she tries to walk at least 20 minutes 3-5 times a week. Does not experience chest pain/pressure during these times, but has noticed more shortness of breath in this last month. This seems to be associated with walking up hill. She does not seem to experience this on flat ground.   Patient states that she is trying to increase her physical activity, as her son is taking her on a trip to [place] in the fall. She currently lives by herself, but has two sons (one in [place] and one in [place]) who check on her often.    Past Medical History:  Diagnosis Date  . Depression    Bipolar  . Diabetes mellitus    diet controlled  . GERD (gastroesophageal reflux disease)   . Hyperlipidemia   .  Hypertension   . Ischemic colitis, enteritis, or enterocolitis ([place])   . Sleep apnea   . Stroke ([place])    x's 2  . Thyroid disease   . Urinary bladder incontinence   . Vertigo     Past Surgical History:  Procedure Laterality Date  . ABDOMINAL HYSTERECTOMY     partial   . APPENDECTOMY    . CARDIAC CATHETERIZATION N/A 12/24/2014   Procedure: Right/Left Heart Cath and Coronary Angiography;  Surgeon: Sanda Klein, MD;  Location: [facility];  Service: Cardiovascular;  Laterality: N/A;  .  CHOLECYSTECTOMY    . FRACTURE SURGERY    . HEMORRHOID SURGERY    . RETINAL DETACHMENT SURGERY    . TONSILLECTOMY      Current Outpatient Medications  Medication Sig Dispense Refill  . aspirin 325 MG tablet Take 325 mg by mouth daily with breakfast.     . betamethasone dipropionate (DIPROLENE) 0.05 % cream Apply 1 application topically 2 (two) times daily as needed (rash).     . bisoprolol (ZEBETA) 10 MG tablet Take 10 mg by mouth daily.    . carbamazepine (CARBATROL) 200 MG 12 hr capsule Take 200 mg by mouth 2 (two) times daily.     . Cholecalciferol (VITAMIN D) 2000 UNITS tablet Take 2,000 Units by mouth daily with breakfast.     . clobetasol (TEMOVATE) 0.05 % GEL Apply 1 application topically 2 (two) times daily as needed (rash).     Marland Kitchen diltiazem (CARTIA XT) 300 MG 24 hr capsule Take 300 mg by mouth at bedtime.     . donepezil (ARICEPT) 10 MG tablet Take 10 mg by mouth at bedtime.     Marland Kitchen esomeprazole (NEXIUM) 40 MG capsule Take 40 mg by mouth at bedtime.     . folic acid (FOLVITE) 1 MG tablet Take 1 mg by mouth daily.    Marland Kitchen gabapentin (NEURONTIN) 300 MG capsule Take 300 mg by mouth 2 (two) times daily.    Marland Kitchen HUMALOG 100 UNIT/ML injection INJECT 0.78ML (78UNITS) INTO THE SKIN VIA V-GO PUMP AS DIRECTED 30 mL 1  . Insulin Disposable Pump (V-GO 40) KIT USE ONCE DAILY AS DIRECTED (Patient taking differently: USE ONCE DAILY AS DIRECTED WITH HUMALOG) 30 kit 1  . Insulin Pen Needle 32G X 4 MM MISC Use 8 pen needles per day 730 each 1  . Insulin Syringe-Needle U-100 (BD INSULIN SYRINGE SAFETYGLIDE) 31G X 15/64" 1 ML MISC Use 1 syringe to inject insulin into V-Go pump daily 30 each 4  . INVOKANA 300 MG TABS tablet TAKE ONE TABLET BY MOUTH DAILY BEFORE BREAKFAST (Patient taking differently: TAKE ONE TABLET (300 MG) BY MOUTH DAILY BEFORE BREAKFAST) 83 tablet 0  . levothyroxine (SYNTHROID, LEVOTHROID) 75 MCG tablet Take 75 mcg by mouth daily before breakfast.     . liraglutide (VICTOZA) 18 MG/3ML SOPN  Inject 0.3 mLs (1.8 mg total) into the skin daily. 3 pen 3  . methotrexate (RHEUMATREX) 2.5 MG tablet Take 7.5 mg by mouth every Monday. For rash    . OLANZapine (ZYPREXA) 5 MG tablet Take 5 mg by mouth at bedtime.    . ondansetron (ZOFRAN) 8 MG tablet Take 1 tablet (8 mg total) by mouth every 8 (eight) hours as needed for nausea. 9 tablet 0  . ONETOUCH DELICA LANCETS 43P MISC USE LANCET TO TEST FIVE TIMES A DAY 200 each 0  . ONETOUCH VERIO test strip USE TO TEST BLOOD SUGAR 3 TIMES DAILY 100 each 11  .  pravastatin (PRAVACHOL) 20 MG tablet Take 20 mg by mouth daily.    Marland Kitchen spironolactone (ALDACTONE) 50 MG tablet Take 1 tablet (50 mg total) by mouth daily. NEED OV. 90 tablet 0   No current facility-administered medications for this visit.     Allergies:   Flagyl [metronidazole hcl]; Ciprofloxacin; Metformin and related; Milk-related compounds; and Other    Social History:  The patient  reports that she has never smoked. She has never used smokeless tobacco. She reports that she does not drink alcohol or use drugs.   Family History:  The patient's family history includes Cancer in her brother and sister; Heart disease in her mother; Parkinson's disease in her father; Stroke in her father.    ROS:  Please see the history of present illness. All other systems are reviewed and negative.    PHYSICAL EXAM: VS:  BP 118/60 (BP Location: Left Arm, Patient Position: Sitting, Cuff Size: Large)   Pulse 83   Ht 5' 3"  (1.6 m)   Wt 229 lb 12.8 oz (104.2 kg)   SpO2 95%   BMI 40.71 kg/m  , BMI Body mass index is 40.71 kg/m. GEN: Well nourished, well developed, overweight female in no acute distress HEENT: normal for age  Neck: no JVD, no carotid bruit, no masses Cardiac: RRR; no murmur, no rubs, or gallops Respiratory:  clear to auscultation bilaterally, normal work of breathing GI: soft, nontender, nondistended, + BS MS: no deformity or atrophy; trace edema; distal pulses are 2+ in all 4  extremities  Skin: warm and dry, no rash Neuro:  Strength and sensation are intact Psych: euthymic mood, full affect   EKG:  EKG is ordered today. The ekg ordered today demonstrates NSR, HR 83 bpm, no acute ischemic changes   Echo 12/09/2014  Study Conclusions - Left ventricle: The cavity size was normal. There was moderate   concentric hypertrophy. Systolic function was normal. The   estimated ejection fraction was in the range of 60% to 65%. Wall   motion was normal; there were no regional wall motion   abnormalities. There was an increased relative contribution of   atrial contraction to ventricular filling. Doppler parameters are   consistent with abnormal left ventricular relaxation (grade 1   diastolic dysfunction). - Aortic valve: There was trivial regurgitation.  CARDIAC CATH: 12/24/2014 Angiographic Findings:  1. The left main coronary artery is free of significant atherosclerosis and bifurcates in the usual fashion into the left anterior descending artery and left circumflex coronary artery.  2. The left anterior descending artery is a large vessel that reaches the apex and generates 75mjor diagonal branches. There is evidence of no luminal irregularities and no calcification. No hemodynamically meaningful stenoses are seen. 3. The left circumflex coronary artery is a very large-size codominant vessel that generates 3 major oblique marginal arteries. There is evidence of no luminal irregularities and no calcification. No hemodynamically meaningful stenoses are seen. 4. The right coronary artery is a medium-size codominant vessel that generates Rv branches and a PDA. There is evidence of no luminal irregularities and no calcification. No hemodynamically meaningful stenoses are seen.  5. The left ventricle is not injected. Normal LVEF by echo and gated nuclear scintigram. There is no aortic valve stenosis by pullback. The left ventricular end-diastolic pressure is 16 mm H.g     IMPRESSIONS: Normal coronary arteries. Normal hemodynamics/filling pressures.  RECOMMENDATION:  Evaluate for a non-cardiac cause of symptoms.  Recent Labs: 03/26/2017: ALT 19; BUN 14; Creatinine, Ser 0.90;  Hemoglobin 12.6; Platelets 393; Potassium 4.4; Sodium 135    Lipid Panel    Component Value Date/Time   CHOL 149 03/26/2017 1710   TRIG 130 03/26/2017 1710   HDL 50 03/26/2017 1710   CHOLHDL 3.0 03/26/2017 1710   VLDL 26 03/26/2017 1710   LDLCALC 73 03/26/2017 1710     Wt Readings from Last 3 Encounters:  04/19/17 229 lb 12.8 oz (104.2 kg)  02/24/17 230 lb (104.3 kg)  02/24/17 230 lb (104.3 kg)     Other studies Reviewed: Additional studies/ records that were reviewed today include: hospital and outside records & labs.   ASSESSMENT AND PLAN:  1.  Atypical Chest Pain, no hx of CAD - patient complaining of substernal chest pressure/tightness increasing in frequency in the past 6 months with associated SOB, non-extertional - Normal L&R heart cath in 2016 and normal Myoview 2015 (patient reports chest pain symptoms were the same at this time)  - Echo 11/2014: EF 60-65%, normal wall motion, G1DD - Continue ASA, SL Nitro as needed - Does have cardiac RF, but atypical symptoms and normal cath make CAD less likely >> non-invasive testing at this time  - Order Leona for this week and have patient follow-up with Dr. Sallyanne Kuster or myself to discuss results  - Have patient hold diabetes medications the morning of (take around noon) & turn down insulin pump to half dose   2.  HTN - BP stable, 118/60 at today's visit  - Continue current medication regimen: Bisoprolol, Cardizem, Spironolactone   - Recommended increasing physical activity and limiting sodium intake to 500 mg/meal  - Encouraged to continue taking BP measurements at home   3.  HLD - FLP 03/26/17: Cholesterol: 149, TG: 130, HDL: 50, LDL: 73 - Continue Pravastatin 20 mg QD  4. DOE - could be due to obesity  hypoventilation - no sig risk for PE, and d-dimer normal in the past, no testing indicated - R heart pressures normal in 2016 - evaluated by Dr Melvyn Novas 01/2015 - PFTs mostly normal, c/w obesity, no decrease in O2 sats w/ ambulation - Dr Melvyn Novas did not find a physiologic cause for the DOE and GERD rx made no difference - increased activity encouraged  Current medicines are reviewed at length with the patient today.  The patient does not have concerns regarding medicines.  The following changes have been made:  None   Labs/ tests ordered today include: Lexiscan Myoview Stress Test   Orders Placed This Encounter  Procedures  . MYOCARDIAL PERFUSION IMAGING  . EKG 12-Lead     Disposition:   FU with Dr. Sallyanne Kuster  Signed, Rosalie Gums, Student PA  Rosaria Ferries, PA-C  04/19/2017 3:51 PM    [facility] Phone: [phone]; Fax: [phone]  This note was written with the assistance of speech recognition software.  Please excuse any transcriptional errors.

## 2017-04-19 NOTE — Progress Notes (Signed)
Suspect, as you do, that the symptoms are not cardiac, but cannot completely rule out coronary vasospasm. She is already on a calcium channel blocker. It is not unreasonable to add isosorbide empirically, just to see if it helps reduce the burden of symptoms. If it does not make a difference after a month, then will stop it. MCr

## 2017-04-19 NOTE — Patient Instructions (Addendum)
Mary Demarkhonda Barrett, PA-C recommends that you continue on your current medications as directed. Please refer to the Current Medication list given to you today.  Your physician has requested that you have a lexiscan myoview. For further information please visit [email or web address]. Please follow instruction sheet, as given. Medications to HOLD: - take diabetes medications hold the morning of - take these medications around 12:00p (noon) - turn down insulin pump to 1/2 dose - increase when you start eating/drinking again - continue all other medications  [name] recommends that you schedule a follow-up appointment first available after stress test with either her or Dr [name].  If you need a refill on your cardiac medications before your next appointment, please call your pharmacy.

## 2017-04-20 ENCOUNTER — Telehealth (HOSPITAL_COMMUNITY): Payer: Self-pay

## 2017-04-20 NOTE — Telephone Encounter (Signed)
Encounter complete. 

## 2017-04-21 ENCOUNTER — Ambulatory Visit (HOSPITAL_COMMUNITY)
Admission: RE | Admit: 2017-04-21 | Discharge: 2017-04-21 | Disposition: A | Discharge status: Home / Self Care | Payer: Medicare Other | Source: Ambulatory Visit | Attending: Cardiovascular Disease | Admitting: Cardiovascular Disease

## 2017-04-21 DIAGNOSIS — R42 Dizziness and giddiness: Secondary | ICD-10-CM | POA: Diagnosis not present

## 2017-04-21 DIAGNOSIS — E079 Disorder of thyroid, unspecified: Secondary | ICD-10-CM | POA: Insufficient documentation

## 2017-04-21 DIAGNOSIS — R079 Chest pain, unspecified: Secondary | ICD-10-CM | POA: Insufficient documentation

## 2017-04-21 DIAGNOSIS — K559 Vascular disorder of intestine, unspecified: Secondary | ICD-10-CM | POA: Insufficient documentation

## 2017-04-21 DIAGNOSIS — E119 Type 2 diabetes mellitus without complications: Secondary | ICD-10-CM | POA: Diagnosis not present

## 2017-04-21 DIAGNOSIS — Z8673 Personal history of transient ischemic attack (TIA), and cerebral infarction without residual deficits: Secondary | ICD-10-CM | POA: Diagnosis not present

## 2017-04-21 DIAGNOSIS — Z8249 Family history of ischemic heart disease and other diseases of the circulatory system: Secondary | ICD-10-CM | POA: Diagnosis not present

## 2017-04-21 DIAGNOSIS — I2510 Atherosclerotic heart disease of native coronary artery without angina pectoris: Secondary | ICD-10-CM | POA: Diagnosis not present

## 2017-04-21 DIAGNOSIS — E669 Obesity, unspecified: Secondary | ICD-10-CM | POA: Diagnosis not present

## 2017-04-21 DIAGNOSIS — G4733 Obstructive sleep apnea (adult) (pediatric): Secondary | ICD-10-CM | POA: Diagnosis not present

## 2017-04-21 MED ORDER — REGADENOSON 0.4 MG/5ML IV SOLN
0.4000 mg | Freq: Once | INTRAVENOUS | Status: AC
  Administered 2017-04-21: 0.4 mg via INTRAVENOUS

## 2017-04-21 MED ORDER — TECHNETIUM TC 99M TETROFOSMIN IV KIT
29.4000 | PACK | Freq: Once | INTRAVENOUS | Status: AC | PRN
  Administered 2017-04-21: 29.4 via INTRAVENOUS
  Filled 2017-04-21: qty 30

## 2017-04-21 MED ORDER — AMINOPHYLLINE 25 MG/ML IV SOLN
75.0000 mg | Freq: Once | INTRAVENOUS | Status: AC
  Administered 2017-04-21: 75 mg via INTRAVENOUS

## 2017-04-22 ENCOUNTER — Ambulatory Visit (HOSPITAL_COMMUNITY)
Admission: RE | Admit: 2017-04-22 | Discharge: 2017-04-22 | Disposition: A | Discharge status: Home / Self Care | Payer: Medicare Other | Source: Ambulatory Visit | Attending: Cardiovascular Disease | Admitting: Cardiovascular Disease

## 2017-04-22 LAB — MYOCARDIAL PERFUSION IMAGING
CHL CUP NUCLEAR SDS: 2
CHL CUP RESTING HR STRESS: 82 {beats}/min
LV dias vol: 74 mL (ref 46–106)
LV sys vol: 23 mL
NUC STRESS TID: 0.84
Peak HR: 93 {beats}/min
SRS: 1
SSS: 3

## 2017-04-22 MED ORDER — TECHNETIUM TC 99M TETROFOSMIN IV KIT
28.2000 | PACK | Freq: Once | INTRAVENOUS | Status: AC | PRN
  Administered 2017-04-22: 28.2 via INTRAVENOUS

## 2017-04-25 ENCOUNTER — Other Ambulatory Visit: Payer: Self-pay | Admitting: Endocrinology

## 2017-04-27 ENCOUNTER — Other Ambulatory Visit: Payer: Self-pay

## 2017-04-27 MED ORDER — ISOSORBIDE MONONITRATE ER 30 MG PO TB24: 30.0000 mg | ORAL_TABLET | Freq: Every day | ORAL | 6 refills | Status: DC

## 2017-05-02 ENCOUNTER — Telehealth: Payer: Self-pay | Admitting: Cardiovascular Disease

## 2017-05-02 DIAGNOSIS — Z79899 Other long term (current) drug therapy: Secondary | ICD-10-CM

## 2017-05-02 DIAGNOSIS — R0602 Shortness of breath: Secondary | ICD-10-CM

## 2017-05-02 NOTE — Telephone Encounter (Signed)
Please order chest CTA

## 2017-05-02 NOTE — Telephone Encounter (Signed)
Stop the isosorbide please.  If chest pain is with breathing, should exclude pulmonary embolism with Chest CTA. MCr

## 2017-05-02 NOTE — Telephone Encounter (Signed)
Returned call to Pt. She is reporting that she's been feeling dizzy, weak, and SOB since she had her stress test. She recently was placed on Imdur 30 mg and she feels taking it makes her symptoms worse. BP reading has been normal with last one 124/72. She denies any swelling or increase in weight. She has lost 20lbs since stress test. She also reports pain with breathing. Routed to Dr. Royann Shiversroitoru.

## 2017-05-02 NOTE — Telephone Encounter (Signed)
New Message:   Pt said she had a Stress Test on 04-21-17 and 04-22-17.She says she have not been right since,she is not able to stay up,have to get back in the bed,dizzy,weak and confused.She have been taking isosorbide,it makes her feel worse,have to stay in bed.Please call asap to evaluate.

## 2017-05-02 NOTE — Telephone Encounter (Signed)
Pt called and informed of Dr. Erin Hearingroitoru's recommendation. Pt verbalized understanding. CTA orders placed and scheduled for 4/17 at 3 pm. Pt will come to have STAT BMP drawn tomorrow 4/16.

## 2017-05-02 NOTE — Addendum Note (Signed)
Addended by: Parke PoissonWADE, Yi Falletta N on: 05/02/2017 04:10 PM   Modules accepted: Orders

## 2017-05-03 LAB — BASIC METABOLIC PANEL
BUN/Creatinine Ratio: 13 (ref 12–28)
BUN: 11 mg/dL (ref 8–27)
CALCIUM: 9.1 mg/dL (ref 8.7–10.3)
CHLORIDE: 98 mmol/L (ref 96–106)
CO2: 22 mmol/L (ref 20–29)
Creatinine, Ser: 0.86 mg/dL (ref 0.57–1.00)
GFR calc non Af Amer: 66 mL/min/{1.73_m2} (ref >59)
GFR, EST AFRICAN AMERICAN: 76 mL/min/{1.73_m2} (ref >59)
Glucose: 170 mg/dL — ABNORMAL HIGH (ref 65–99)
POTASSIUM: 5.1 mmol/L (ref 3.5–5.2)
Sodium: 136 mmol/L (ref 134–144)

## 2017-05-04 ENCOUNTER — Emergency Department (HOSPITAL_COMMUNITY): Payer: Medicare Other

## 2017-05-04 ENCOUNTER — Other Ambulatory Visit: Payer: Self-pay

## 2017-05-04 ENCOUNTER — Ambulatory Visit (INDEPENDENT_AMBULATORY_CARE_PROVIDER_SITE_OTHER)
Admission: RE | Admit: 2017-05-04 | Discharge: 2017-05-04 | Disposition: A | Discharge status: Home / Self Care | Payer: Medicare Other | Source: Ambulatory Visit | Attending: Cardiovascular Disease | Admitting: Cardiovascular Disease

## 2017-05-04 ENCOUNTER — Telehealth: Payer: Self-pay

## 2017-05-04 ENCOUNTER — Encounter (HOSPITAL_COMMUNITY): Payer: Self-pay | Admitting: Emergency Medicine

## 2017-05-04 ENCOUNTER — Inpatient Hospital Stay (HOSPITAL_COMMUNITY)
Admission: EM | Admit: 2017-05-04 | Discharge: 2017-05-17 | DRG: 315 | Disposition: A | Discharge status: Skilled Nursing Facility | Payer: Medicare Other | Attending: Internal Medicine | Admitting: Internal Medicine

## 2017-05-04 DIAGNOSIS — Z9119 Patient's noncompliance with other medical treatment and regimen: Secondary | ICD-10-CM

## 2017-05-04 DIAGNOSIS — B349 Viral infection, unspecified: Secondary | ICD-10-CM | POA: Diagnosis present

## 2017-05-04 DIAGNOSIS — R5381 Other malaise: Secondary | ICD-10-CM | POA: Diagnosis not present

## 2017-05-04 DIAGNOSIS — J90 Pleural effusion, not elsewhere classified: Secondary | ICD-10-CM | POA: Diagnosis present

## 2017-05-04 DIAGNOSIS — R0902 Hypoxemia: Secondary | ICD-10-CM | POA: Diagnosis present

## 2017-05-04 DIAGNOSIS — Z91018 Allergy to other foods: Secondary | ICD-10-CM | POA: Diagnosis not present

## 2017-05-04 DIAGNOSIS — R6889 Other general symptoms and signs: Secondary | ICD-10-CM | POA: Diagnosis not present

## 2017-05-04 DIAGNOSIS — I319 Disease of pericardium, unspecified: Secondary | ICD-10-CM

## 2017-05-04 DIAGNOSIS — Z888 Allergy status to other drugs, medicaments and biological substances status: Secondary | ICD-10-CM | POA: Diagnosis not present

## 2017-05-04 DIAGNOSIS — E1100 Type 2 diabetes mellitus with hyperosmolarity without nonketotic hyperglycemic-hyperosmolar coma (NKHHC): Secondary | ICD-10-CM | POA: Diagnosis not present

## 2017-05-04 DIAGNOSIS — R05 Cough: Secondary | ICD-10-CM

## 2017-05-04 DIAGNOSIS — R062 Wheezing: Secondary | ICD-10-CM | POA: Diagnosis not present

## 2017-05-04 DIAGNOSIS — E7800 Pure hypercholesterolemia, unspecified: Secondary | ICD-10-CM | POA: Diagnosis not present

## 2017-05-04 DIAGNOSIS — I2781 Cor pulmonale (chronic): Secondary | ICD-10-CM | POA: Diagnosis present

## 2017-05-04 DIAGNOSIS — E669 Obesity, unspecified: Secondary | ICD-10-CM | POA: Diagnosis not present

## 2017-05-04 DIAGNOSIS — Z9181 History of falling: Secondary | ICD-10-CM

## 2017-05-04 DIAGNOSIS — E1169 Type 2 diabetes mellitus with other specified complication: Secondary | ICD-10-CM | POA: Diagnosis not present

## 2017-05-04 DIAGNOSIS — R0602 Shortness of breath: Secondary | ICD-10-CM

## 2017-05-04 DIAGNOSIS — Z79890 Hormone replacement therapy (postmenopausal): Secondary | ICD-10-CM

## 2017-05-04 DIAGNOSIS — G4733 Obstructive sleep apnea (adult) (pediatric): Secondary | ICD-10-CM | POA: Diagnosis present

## 2017-05-04 DIAGNOSIS — Z6841 Body Mass Index (BMI) 40.0 and over, adult: Secondary | ICD-10-CM

## 2017-05-04 DIAGNOSIS — I308 Other forms of acute pericarditis: Principal | ICD-10-CM | POA: Diagnosis present

## 2017-05-04 DIAGNOSIS — Z9049 Acquired absence of other specified parts of digestive tract: Secondary | ICD-10-CM | POA: Diagnosis not present

## 2017-05-04 DIAGNOSIS — Z8673 Personal history of transient ischemic attack (TIA), and cerebral infarction without residual deficits: Secondary | ICD-10-CM

## 2017-05-04 DIAGNOSIS — R700 Elevated erythrocyte sedimentation rate: Secondary | ICD-10-CM | POA: Diagnosis present

## 2017-05-04 DIAGNOSIS — E785 Hyperlipidemia, unspecified: Secondary | ICD-10-CM | POA: Diagnosis present

## 2017-05-04 DIAGNOSIS — R079 Chest pain, unspecified: Secondary | ICD-10-CM

## 2017-05-04 DIAGNOSIS — I313 Pericardial effusion (noninflammatory): Secondary | ICD-10-CM

## 2017-05-04 DIAGNOSIS — I129 Hypertensive chronic kidney disease with stage 1 through stage 4 chronic kidney disease, or unspecified chronic kidney disease: Secondary | ICD-10-CM | POA: Diagnosis present

## 2017-05-04 DIAGNOSIS — N390 Urinary tract infection, site not specified: Secondary | ICD-10-CM | POA: Diagnosis present

## 2017-05-04 DIAGNOSIS — Z794 Long term (current) use of insulin: Secondary | ICD-10-CM

## 2017-05-04 DIAGNOSIS — Z7982 Long term (current) use of aspirin: Secondary | ICD-10-CM

## 2017-05-04 DIAGNOSIS — R000 Tachycardia, unspecified: Secondary | ICD-10-CM | POA: Diagnosis not present

## 2017-05-04 DIAGNOSIS — Z9641 Presence of insulin pump (external) (internal): Secondary | ICD-10-CM | POA: Diagnosis present

## 2017-05-04 DIAGNOSIS — R21 Rash and other nonspecific skin eruption: Secondary | ICD-10-CM | POA: Diagnosis not present

## 2017-05-04 DIAGNOSIS — I300 Acute nonspecific idiopathic pericarditis: Secondary | ICD-10-CM | POA: Diagnosis not present

## 2017-05-04 DIAGNOSIS — K219 Gastro-esophageal reflux disease without esophagitis: Secondary | ICD-10-CM | POA: Diagnosis present

## 2017-05-04 DIAGNOSIS — R0789 Other chest pain: Secondary | ICD-10-CM | POA: Diagnosis present

## 2017-05-04 DIAGNOSIS — I2510 Atherosclerotic heart disease of native coronary artery without angina pectoris: Secondary | ICD-10-CM | POA: Diagnosis present

## 2017-05-04 DIAGNOSIS — I309 Acute pericarditis, unspecified: Secondary | ICD-10-CM | POA: Diagnosis not present

## 2017-05-04 DIAGNOSIS — J9811 Atelectasis: Secondary | ICD-10-CM | POA: Diagnosis present

## 2017-05-04 DIAGNOSIS — R42 Dizziness and giddiness: Secondary | ICD-10-CM | POA: Diagnosis present

## 2017-05-04 DIAGNOSIS — Z820 Family history of epilepsy and other diseases of the nervous system: Secondary | ICD-10-CM

## 2017-05-04 DIAGNOSIS — I301 Infective pericarditis: Secondary | ICD-10-CM | POA: Diagnosis not present

## 2017-05-04 DIAGNOSIS — I3139 Other pericardial effusion (noninflammatory): Secondary | ICD-10-CM | POA: Diagnosis present

## 2017-05-04 DIAGNOSIS — E871 Hypo-osmolality and hyponatremia: Secondary | ICD-10-CM | POA: Diagnosis present

## 2017-05-04 DIAGNOSIS — E039 Hypothyroidism, unspecified: Secondary | ICD-10-CM | POA: Diagnosis present

## 2017-05-04 DIAGNOSIS — R0989 Other specified symptoms and signs involving the circulatory and respiratory systems: Secondary | ICD-10-CM | POA: Diagnosis not present

## 2017-05-04 DIAGNOSIS — N3000 Acute cystitis without hematuria: Secondary | ICD-10-CM | POA: Diagnosis not present

## 2017-05-04 DIAGNOSIS — R509 Fever, unspecified: Secondary | ICD-10-CM | POA: Diagnosis not present

## 2017-05-04 DIAGNOSIS — E119 Type 2 diabetes mellitus without complications: Secondary | ICD-10-CM | POA: Diagnosis present

## 2017-05-04 DIAGNOSIS — I10 Essential (primary) hypertension: Secondary | ICD-10-CM | POA: Diagnosis present

## 2017-05-04 DIAGNOSIS — R059 Cough, unspecified: Secondary | ICD-10-CM

## 2017-05-04 DIAGNOSIS — Z8249 Family history of ischemic heart disease and other diseases of the circulatory system: Secondary | ICD-10-CM

## 2017-05-04 DIAGNOSIS — G9349 Other encephalopathy: Secondary | ICD-10-CM | POA: Diagnosis present

## 2017-05-04 DIAGNOSIS — Z79899 Other long term (current) drug therapy: Secondary | ICD-10-CM

## 2017-05-04 DIAGNOSIS — Z881 Allergy status to other antibiotic agents status: Secondary | ICD-10-CM

## 2017-05-04 DIAGNOSIS — R4182 Altered mental status, unspecified: Secondary | ICD-10-CM | POA: Diagnosis not present

## 2017-05-04 DIAGNOSIS — Z8679 Personal history of other diseases of the circulatory system: Secondary | ICD-10-CM

## 2017-05-04 DIAGNOSIS — Z809 Family history of malignant neoplasm, unspecified: Secondary | ICD-10-CM

## 2017-05-04 DIAGNOSIS — E782 Mixed hyperlipidemia: Secondary | ICD-10-CM | POA: Diagnosis present

## 2017-05-04 DIAGNOSIS — Z90710 Acquired absence of both cervix and uterus: Secondary | ICD-10-CM | POA: Diagnosis not present

## 2017-05-04 DIAGNOSIS — F319 Bipolar disorder, unspecified: Secondary | ICD-10-CM | POA: Diagnosis present

## 2017-05-04 DIAGNOSIS — Z823 Family history of stroke: Secondary | ICD-10-CM

## 2017-05-04 HISTORY — DX: Disease of pericardium, unspecified: I31.9

## 2017-05-04 LAB — BASIC METABOLIC PANEL
ANION GAP: 12 (ref 5–15)
BUN: 8 mg/dL (ref 6–20)
CHLORIDE: 100 mmol/L — AB (ref 101–111)
CO2: 22 mmol/L (ref 22–32)
Calcium: 9 mg/dL (ref 8.9–10.3)
Creatinine, Ser: 0.85 mg/dL (ref 0.44–1.00)
Glucose, Bld: 135 mg/dL — ABNORMAL HIGH (ref 65–99)
POTASSIUM: 4.7 mmol/L (ref 3.5–5.1)
SODIUM: 134 mmol/L — AB (ref 135–145)

## 2017-05-04 LAB — CREATININE, SERUM: Creatinine, Ser: 0.82 mg/dL (ref 0.44–1.00)

## 2017-05-04 LAB — CBC
HCT: 37.9 % (ref 36.0–46.0)
HEMATOCRIT: 37.3 % (ref 36.0–46.0)
HEMOGLOBIN: 11.9 g/dL — AB (ref 12.0–15.0)
Hemoglobin: 11.8 g/dL — ABNORMAL LOW (ref 12.0–15.0)
MCH: 25 pg — ABNORMAL LOW (ref 26.0–34.0)
MCH: 25.2 pg — ABNORMAL LOW (ref 26.0–34.0)
MCHC: 31.6 g/dL (ref 30.0–36.0)
MCHC: 31.4 g/dL (ref 30.0–36.0)
MCV: 79 fL (ref 78.0–100.0)
MCV: 80.1 fL (ref 78.0–100.0)
PLATELETS: 672 10*3/uL — AB (ref 150–400)
Platelets: 625 10*3/uL — ABNORMAL HIGH (ref 150–400)
RBC: 4.72 MIL/uL (ref 3.87–5.11)
RBC: 4.73 MIL/uL (ref 3.87–5.11)
RDW: 15.9 % — AB (ref 11.5–15.5)
RDW: 16.2 % — ABNORMAL HIGH (ref 11.5–15.5)
WBC: 11.2 10*3/uL — AB (ref 4.0–10.5)
WBC: 10.5 10*3/uL (ref 4.0–10.5)

## 2017-05-04 LAB — ECHOCARDIOGRAM COMPLETE: Height: 63 in

## 2017-05-04 LAB — I-STAT TROPONIN, ED: Troponin i, poc: 0.03 ng/mL (ref 0.00–0.08)

## 2017-05-04 LAB — GLUCOSE, CAPILLARY: GLUCOSE-CAPILLARY: 170 mg/dL — AB (ref 65–99)

## 2017-05-04 MED ORDER — INSULIN ASPART 100 UNIT/ML ~~LOC~~ SOLN: 0.0000 [IU] | Freq: Three times a day (TID) | SUBCUTANEOUS | Status: DC

## 2017-05-04 MED ORDER — ASPIRIN 325 MG PO TABS
325.0000 mg | ORAL_TABLET | Freq: Every day | ORAL | Status: DC
  Administered 2017-05-05 – 2017-05-06 (×2): 325 mg via ORAL
  Filled 2017-05-04 (×2): qty 1

## 2017-05-04 MED ORDER — NITROGLYCERIN 0.4 MG SL SUBL
0.4000 mg | SUBLINGUAL_TABLET | SUBLINGUAL | Status: DC | PRN
  Administered 2017-05-14 (×2): 0.4 mg via SUBLINGUAL
  Filled 2017-05-04: qty 1

## 2017-05-04 MED ORDER — DONEPEZIL HCL 10 MG PO TABS
10.0000 mg | ORAL_TABLET | Freq: Every day | ORAL | Status: DC
  Administered 2017-05-05 – 2017-05-16 (×13): 10 mg via ORAL
  Filled 2017-05-04 (×13): qty 1

## 2017-05-04 MED ORDER — ACETAMINOPHEN 325 MG PO TABS
650.0000 mg | ORAL_TABLET | ORAL | Status: DC | PRN
  Administered 2017-05-05 – 2017-05-13 (×3): 650 mg via ORAL
  Filled 2017-05-04 (×3): qty 2

## 2017-05-04 MED ORDER — OLANZAPINE 5 MG PO TABS
5.0000 mg | ORAL_TABLET | Freq: Every day | ORAL | Status: DC
  Administered 2017-05-05 – 2017-05-16 (×13): 5 mg via ORAL
  Filled 2017-05-04 (×15): qty 1

## 2017-05-04 MED ORDER — LEVOTHYROXINE SODIUM 75 MCG PO TABS
75.0000 ug | ORAL_TABLET | Freq: Every day | ORAL | Status: DC
  Administered 2017-05-05 – 2017-05-17 (×13): 75 ug via ORAL
  Filled 2017-05-04 (×14): qty 1

## 2017-05-04 MED ORDER — DILTIAZEM HCL ER COATED BEADS 240 MG PO CP24
240.0000 mg | ORAL_CAPSULE | Freq: Every day | ORAL | Status: DC
  Administered 2017-05-05 – 2017-05-10 (×7): 240 mg via ORAL
  Filled 2017-05-04 (×7): qty 1

## 2017-05-04 MED ORDER — HEPARIN SODIUM (PORCINE) 5000 UNIT/ML IJ SOLN
5000.0000 [IU] | Freq: Three times a day (TID) | INTRAMUSCULAR | Status: DC
  Administered 2017-05-04 – 2017-05-16 (×34): 5000 [IU] via SUBCUTANEOUS
  Filled 2017-05-04 (×33): qty 1

## 2017-05-04 MED ORDER — GABAPENTIN 300 MG PO CAPS
300.0000 mg | ORAL_CAPSULE | Freq: Two times a day (BID) | ORAL | Status: DC
  Administered 2017-05-04 – 2017-05-17 (×26): 300 mg via ORAL
  Filled 2017-05-04 (×26): qty 1

## 2017-05-04 MED ORDER — ONDANSETRON HCL 4 MG/2ML IJ SOLN
4.0000 mg | Freq: Four times a day (QID) | INTRAMUSCULAR | Status: DC | PRN
  Administered 2017-05-07 – 2017-05-15 (×4): 4 mg via INTRAVENOUS
  Filled 2017-05-04 (×4): qty 2

## 2017-05-04 MED ORDER — BISOPROLOL FUMARATE 5 MG PO TABS
5.0000 mg | ORAL_TABLET | Freq: Every day | ORAL | Status: DC
  Administered 2017-05-05 – 2017-05-08 (×4): 5 mg via ORAL
  Filled 2017-05-04 (×4): qty 1

## 2017-05-04 MED ORDER — CARBAMAZEPINE ER 200 MG PO TB12
200.0000 mg | ORAL_TABLET | Freq: Two times a day (BID) | ORAL | Status: DC
  Administered 2017-05-04 – 2017-05-17 (×26): 200 mg via ORAL
  Filled 2017-05-04 (×27): qty 1

## 2017-05-04 MED ORDER — PANTOPRAZOLE SODIUM 40 MG PO TBEC
40.0000 mg | DELAYED_RELEASE_TABLET | Freq: Every day | ORAL | Status: DC
  Administered 2017-05-05 – 2017-05-17 (×14): 40 mg via ORAL
  Filled 2017-05-04 (×14): qty 1

## 2017-05-04 MED ORDER — IOPAMIDOL (ISOVUE-370) INJECTION 76%
80.0000 mL | Freq: Once | INTRAVENOUS | Status: AC | PRN
  Administered 2017-05-04: 80 mL via INTRAVENOUS

## 2017-05-04 MED ORDER — PRAVASTATIN SODIUM 20 MG PO TABS
20.0000 mg | ORAL_TABLET | Freq: Every day | ORAL | Status: DC
  Administered 2017-05-05 – 2017-05-17 (×13): 20 mg via ORAL
  Filled 2017-05-04 (×13): qty 1

## 2017-05-04 NOTE — Telephone Encounter (Signed)
CT called stating Pt CTA didn't show a PE but bilateral pleural effusions. Pt still having SOB and not feeling well. Per Dr. Elissa HeftyHarding's recommendation he would advise for pt to go to ED. Spoke with Pt son who verbalized understanding.

## 2017-05-04 NOTE — Consult Note (Addendum)
Cardiology Consultation:   Patient ID: [name]; [id number]; June 22, 1940   Admit date: 05/04/2017 Date of Consult: 05/04/2017  Primary Care Provider: Merrilee Seashore, MD Primary Cardiologist: Dr. Sallyanne Kuster  Patient Profile:   [name] is a 77 y.o. female with no known hx of CAD per cath and normal nuc stress in 2012/2019,  however does have a hx of obesity, type 2 diabetes mellitus, previous history of TIA and possible ischemic colitis, hypertension who is being seen today for the evaluation of shortness of breath with CTA positive for large pericardial effusion and left pleural effusions on 05/04/17 ordered as OP by Dr. Sallyanne Kuster.   History of Present Illness:   [name] is a 77yo F with a Hx as stated above who presented to the ED on 05/04/17 for further evaluation of the large pericardial effusion found on OP CTA.    Pt called to the office today (05/04/17) with c/o SOB with inspiration. Given these complaints, a chest CTA was ordered for further evaluation. Unfortunately, the CTA revealed a large pericardial effusion with no evidence of PE. Subsequently, the pt was sent to the ED and a STAT echocardiogram was ordered to deifinatively evaluate the size ogf the effusion.   In the ED, trop levels have been negative. EKG shows inferolateral T-wave inversions. Pt is stable at the time of interview. Was actually in the waiting room at the time of initial encounter. Denies chest pain, palpitations, orthopnea, dizziness, or syncope. Reports feeling "bad" since her fall in March of this year.   Of note, on 03/26/2017 the pt was seen in the ER for chest pain and pain in the left shoulder area after a mechanical fall out of bed. Initial ECG during that time was read as acute MI, but repeat ECG showed only sinus rhythm with enzymes negative.    Past Medical History:  Diagnosis Date  . Depression    Bipolar  . Diabetes mellitus    diet controlled  . GERD (gastroesophageal  reflux disease)   . Hyperlipidemia   . Hypertension   . Ischemic colitis, enteritis, or enterocolitis ([place])   . Sleep apnea   . Stroke ([place])    x's 2  . Thyroid disease   . Urinary bladder incontinence   . Vertigo     Past Surgical History:  Procedure Laterality Date  . ABDOMINAL HYSTERECTOMY     partial   . APPENDECTOMY    . CARDIAC CATHETERIZATION N/A 12/24/2014   Procedure: Right/Left Heart Cath and Coronary Angiography;  Surgeon: Sanda Klein, MD;  Location: [facility];  Service: Cardiovascular;  Laterality: N/A;  . CHOLECYSTECTOMY    . FRACTURE SURGERY    . HEMORRHOID SURGERY    . RETINAL DETACHMENT SURGERY    . TONSILLECTOMY       Prior to Admission medications   Medication Sig Start Date End Date Taking? Authorizing Provider  aspirin 325 MG tablet Take 325 mg by mouth daily with breakfast.     [provider]  betamethasone dipropionate (DIPROLENE) 0.05 % cream Apply 1 application topically 2 (two) times daily as needed (rash).     [provider]  bisoprolol (ZEBETA) 10 MG tablet Take 10 mg by mouth daily.    [provider]  carbamazepine (CARBATROL) 200 MG 12 hr capsule Take 200 mg by mouth 2 (two) times daily.     [provider]  Cholecalciferol (VITAMIN D) 2000 UNITS tablet Take 2,000 Units by mouth daily  with breakfast.     [provider]  clobetasol (TEMOVATE) 0.05 % GEL Apply 1 application topically 2 (two) times daily as needed (rash).     [provider]  diltiazem (CARTIA XT) 300 MG 24 hr capsule Take 300 mg by mouth at bedtime.     [provider]  donepezil (ARICEPT) 10 MG tablet Take 10 mg by mouth at bedtime.     [provider]  esomeprazole (NEXIUM) 40 MG capsule Take 40 mg by mouth at bedtime.     [provider]  folic acid (FOLVITE) 1 MG tablet Take 1 mg by mouth daily.    [provider]  gabapentin (NEURONTIN) 300 MG capsule Take 300 mg by mouth 2  (two) times daily.    [provider]  HUMALOG 100 UNIT/ML injection INJECT 0.78ML (78UNITS) INTO THE SKIN VIA V-GO PUMP AS DIRECTED 11/22/16   Elayne Snare, MD  Insulin Disposable Pump (V-GO 40) KIT USE ONCE DAILY AS DIRECTED Patient taking differently: USE ONCE DAILY AS DIRECTED WITH HUMALOG 03/22/17   Elayne Snare, MD  Insulin Pen Needle 32G X 4 MM MISC Use 8 pen needles per day 02/08/17   Elayne Snare, MD  Insulin Syringe-Needle U-100 (BD INSULIN SYRINGE SAFETYGLIDE) 31G X 15/64" 1 ML MISC Use 1 syringe to inject insulin into V-Go pump daily 08/20/16   Elayne Snare, MD  INVOKANA 300 MG TABS tablet TAKE ONE TABLET BY MOUTH DAILY BEFORE BREAKFAST 04/25/17   Elayne Snare, MD  isosorbide mononitrate (IMDUR) 30 MG 24 hr tablet Take 1 tablet (30 mg total) by mouth daily. 04/27/17   Croitoru, Mihai, MD  levothyroxine (SYNTHROID, LEVOTHROID) 75 MCG tablet Take 75 mcg by mouth daily before breakfast.     [provider]  liraglutide (VICTOZA) 18 MG/3ML SOPN Inject 0.3 mLs (1.8 mg total) into the skin daily. 02/24/17   Elayne Snare, MD  methotrexate (RHEUMATREX) 2.5 MG tablet Take 7.5 mg by mouth every Monday. For rash    [provider]  OLANZapine (ZYPREXA) 5 MG tablet Take 5 mg by mouth at bedtime.    [provider]  ondansetron (ZOFRAN) 8 MG tablet Take 1 tablet (8 mg total) by mouth every 8 (eight) hours as needed for nausea. 05/08/12   Dorie Rank, MD  [facility] DELICA LANCETS 05W MISC USE LANCET TO TEST FIVE TIMES A DAY 01/19/17   Elayne Snare, MD  [facility] VERIO test strip USE TO TEST BLOOD SUGAR 3 TIMES DAILY 12/08/16   Elayne Snare, MD  pravastatin (PRAVACHOL) 20 MG tablet Take 20 mg by mouth daily.    [provider]  spironolactone (ALDACTONE) 50 MG tablet Take 1 tablet (50 mg total) by mouth daily. NEED OV. 03/10/17   Croitoru, Dani Gobble, MD    Inpatient Medications: Scheduled Meds:  Continuous Infusions:  PRN Meds:   Allergies:    Allergies  Allergen Reactions    . Flagyl [Metronidazole Hcl] Itching and Rash  . Ciprofloxacin Itching and Rash  . Metformin And Related Rash  . Milk-Related Compounds Other (See Comments)    Stomach pains  . Other Other (See Comments)    [place] nuts cause severe facial redness    Social History:   Social History   Socioeconomic History  . Marital status: Widowed    Spouse name: Not on file  . Number of children: 2  . Years of education: 11  . Highest education level: Not on file  Occupational History  . Occupation: Retired  Social Needs  . Financial resource strain: Not on file  . Food insecurity:    Worry: Not on file    Inability: Not on file  . Transportation needs:    Medical: Not on file    Non-medical: Not on file  Tobacco Use  . Smoking status: Never Smoker  . Smokeless tobacco: Never Used  Substance and Sexual Activity  . Alcohol use: No  . Drug use: No  . Sexual activity: Not on file  Lifestyle  . Physical activity:    Days per week: Not on file    Minutes per session: Not on file  . Stress: Not on file  Relationships  . Social connections:    Talks on phone: Not on file    Gets together: Not on file    Attends religious service: Not on file    Active member of club or organization: Not on file    Attends meetings of clubs or organizations: Not on file    Relationship status: Not on file  . Intimate partner violence:    Fear of current or ex partner: Not on file    Emotionally abused: Not on file    Physically abused: Not on file    Forced sexual activity: Not on file  Other Topics Concern  . Not on file  Social History Narrative   Lives at home alone.   Right-handed.   No caffeine use.    Family History:   Family History  Problem Relation Age of Onset  . Heart disease Mother   . Stroke Father   . Parkinson's disease Father   . Cancer Sister   . Cancer Brother    Family Status:  Family Status  Relation Name Status  . Mother  Alive  . Father  Deceased at age 60  .  Sister 5 Alive  . Brother  (Not Specified)    ROS:  Please see the history of present illness.  All other ROS reviewed and negative.     Physical Exam/Data:   Vitals:   05/04/17 1600 05/04/17 1601  BP: 118/61   Pulse: 85   Resp: 18   Temp: 98.7 F (37.1 C)   TempSrc: Oral   SpO2: 98%   Height:  '5\' 3"'$  (1.6 m)   No intake or output data in the 24 hours ending 05/04/17 1631 There were no vitals filed for this visit. Body mass index is 40.57 kg/m.   General: Well developed, well nourished, NAD Skin: Warm, dry, intact  Head: Normocephalic, atraumatic, clear, moist mucus membranes. Neck: Negative for carotid bruits. No JVD Lungs:Clear to ausculation bilaterally. No wheezes, rales, or rhonchi. Breathing is unlabored. Cardiovascular: RRR with S1 S2. No murmurs, rubs, or gallops. Negative pulsus paradoxus  Abdomen: Soft, non-tender, non-distended with normoactive bowel sounds.  No obvious abdominal masses. MSK: Strength and tone appear normal for age. 5/5 in all extremities Extremities: 1 + BLE edema. No clubbing or cyanosis. DP/PT pulses 2+ bilaterally Neuro: Alert and oriented. No focal deficits. No facial asymmetry. MAE spontaneously. Psych: Responds to questions appropriately with normal affect, no focal abnormalities noted Psych:  Normal affect   EKG:  The EKG was personally reviewed and demonstrates: NSR with T wave inversion in inferolateral leads  Telemetry:  Telemetry was personally reviewed and demonstrates: Not on telemetry   Relevant CV Studies:  ECHO: Pending   CATH: 12/24/14 IMPRESSIONS:  Normal coronary arteries. Normal hemodynamics/filling pressures.   RECOMMENDATION:  Evaluate for a non-cardiac cause of  symptoms  Nuc stress 04/22/17:   Normal perfusion No ischemia or scar  Nuclear stress EF: 70%.  This is a low risk study.  Laboratory Data:  Chemistry Recent Labs  Lab 05/03/17 0830  NA 136  K 5.1  CL 98  CO2 22  GLUCOSE 170*  BUN 11    CREATININE 0.86  CALCIUM 9.1  GFRNONAA 66  GFRAA 76    Total Protein  Date Value Ref Range Status  03/26/2017 7.1 6.5 - 8.1 g/dL Final   Albumin  Date Value Ref Range Status  03/26/2017 3.5 3.5 - 5.0 g/dL Final   AST  Date Value Ref Range Status  03/26/2017 24 15 - 41 U/L Final   ALT  Date Value Ref Range Status  03/26/2017 19 14 - 54 U/L Final   Alkaline Phosphatase  Date Value Ref Range Status  03/26/2017 86 38 - 126 U/L Final   Total Bilirubin  Date Value Ref Range Status  03/26/2017 0.2 (L) 0.3 - 1.2 mg/dL Final   Hematology Recent Labs  Lab 05/04/17 1615  WBC 11.2*  RBC 4.72  HGB 11.8*  HCT 37.3  MCV 79.0  MCH 25.0*  MCHC 31.6  RDW 15.9*  PLT 625*   Cardiac EnzymesNo results for input(s): TROPONINI in the last 168 hours. No results for input(s): TROPIPOC in the last 168 hours.  BNPNo results for input(s): BNP, PROBNP in the last 168 hours.  DDimer No results for input(s): DDIMER in the last 168 hours. TSH:  Lab Results  Component Value Date   TSH 1.32 12/19/2015   Lipids: Lab Results  Component Value Date   CHOL 149 03/26/2017   HDL 50 03/26/2017   LDLCALC 73 03/26/2017   TRIG 130 03/26/2017   CHOLHDL 3.0 03/26/2017   HgbA1c: Lab Results  Component Value Date   HGBA1C 7.2 (H) 02/22/2017    Radiology/Studies:  Ct Angio Chest Pe W Or Wo Contrast  Result Date: 05/04/2017 CLINICAL DATA:  Severe shortness of breath beginning 04/21/2017. EXAM: CT ANGIOGRAPHY CHEST WITH CONTRAST TECHNIQUE: Multidetector CT imaging of the chest was performed using the standard protocol during bolus administration of intravenous contrast. Multiplanar CT image reconstructions and MIPs were obtained to evaluate the vascular anatomy. CONTRAST:  86m ISOVUE-370 IOPAMIDOL (ISOVUE-370) INJECTION 76% COMPARISON:  Radiography 03/26/2017. FINDINGS: Cardiovascular: Pulmonary arterial opacification is excellent. There are no pulmonary emboli. There is aortic atherosclerosis but  no aneurysm. No coronary artery calcification is seen. Heart size is normal. There is a large pericardial effusion. Mediastinum/Nodes: No mass or lymphadenopathy. Lungs/Pleura: Bilateral pleural effusions, moderate size on the left and small on the right, layering dependently with dependent pulmonary atelectasis. Upper Abdomen: Negative.  Previous cholecystectomy. Musculoskeletal: Minimal spinal degenerative changes. No significant bone finding. Review of the MIP images confirms the above findings. IMPRESSION: No pulmonary emboli. Large pericardial effusion. Moderate sized left pleural effusion and small right pleural effusion layering dependently with associated dependent pulmonary atelectasis. Aortic atherosclerosis. Electronically Signed   By: MNelson ChimesM.D.   On: 05/04/2017 15:12    Assessment and Plan:   1. Large pericardial effusion: Pt has been having SOB with exertion for the last month. She has had a negative cardiac workup. Due to worsening SOB, now with inspiration on 05/02/17, the pt called the office for advice. A CTA was ordered and found to have large pericardial effusion. Pt to have stat echocardiogram. We will plan to admit her and follow results from echo for next steps in our plan.  Pt currently stable. Negative pulsus paradoxus per MD. BP stable. We are awaiting labs and will restart home medications. Trop in ED have been negative. EKG with T-wave inversion, no acute changes. We will plan to restart home medications, evaluate echo results and plan accordingly.   2. DM II: -Controlled -Will start SSI while in the hospital   3. HTN: -Stable  -Continue current regimen -Will reduce bisoprolol to 25m, decrease Diltiazem to 240 mg CD, hold IMDUR, hold spronolactone   For questions or updates, please contact [place]Please consult [email or web address] for contact info under Cardiology/STEMI.   SLyndel SafeNP-C [facility] Pager: [phone]/17/2019 4:31  PM  History and all data above reviewed.  Patient examined.  I agree with the findings as above. She presents with what is described as a large effusion on CT.  She has had dyspnea since March .  She just has not felt right.  She had decreased energy.  She has had no chest pain.  No fevers, no chills.  She has had no weight gain or edema.  She does have DOE walking about 50 yards and she has to stop and catch her breath for a few minutes.  She is not having PND or orthopnea.  She is not having any cough.  She had a CT to rule out PE.    The patient exam reveals COR:RRR, no rub, no murmurms  ,  Lungs: Clear  ,  Abd: Positive bowel sounds, no rebound no guarding, Ext No edema   .  All available labs, radiology testing, previous records reviewed. Agree with documented assessment and plan. SOB:  She has effusion on chest CT.  However, she is not hypotensive, has no acute dyspnea and has no pulsus paradox.  She has no evidence of tamponade.  We need to quantify the effusion with echo (CT often over estimates).  HTN:  I will, for tonight, reduce her BP meds until I make sure there is no evidence of large effusion with tamponade physiology.  I will in particular hold the spironolactone.    JJeneen RinksHochrein  6:15 PM  05/04/2017

## 2017-05-04 NOTE — ED Provider Notes (Signed)
[facility] Provider Note   CSN: [id number] Arrival date & time: 05/04/17  1552     History   Chief Complaint Chief Complaint  Patient presents with  . Shortness of Breath    HPI [name] is a 77 y.o. female.  Patient presents with increasing dyspnea and chest pain over the past 2-3 weeks.  Additionally, she complains of malaise, decreased energy, exhaustion, dizziness.  She was seen by her cardiologist today and a CT angiogram was ordered which revealed a pericardial effusion.  She then presented to the emergency department for further evaluation including echocardiogram.  Severity of symptoms is moderate to severe.  Exertion makes symptoms worse.     Past Medical History:  Diagnosis Date  . Depression    Bipolar  . Diabetes mellitus    diet controlled  . GERD (gastroesophageal reflux disease)   . Hyperlipidemia   . Hypertension   . Ischemic colitis, enteritis, or enterocolitis ([place])   . Sleep apnea   . Stroke ([place])    x's 2  . Thyroid disease   . Urinary bladder incontinence   . Vertigo     Patient Active Problem List   Diagnosis Date Noted  . Pericardial effusion 05/04/2017  . OSA (obstructive sleep apnea) 02/18/2017  . Dyspnea 01/27/2015  . Atypical chest pain 12/24/2014  . SOB (shortness of breath)   . Accelerating angina ([place]) 12/23/2014  . Chest pain at rest 10/30/2013  . Exertional dyspnea 10/30/2013  . Hyperlipidemia 10/30/2013  . Morbid obesity ([place]) 10/30/2013  . Acquired autoimmune hypothyroidism 09/02/2013  . Type II or unspecified type diabetes mellitus without mention of complication, uncontrolled 06/14/2013  . Essential hypertension, benign 06/14/2013  . Hyperlipemia 06/14/2013    Past Surgical History:  Procedure Laterality Date  . ABDOMINAL HYSTERECTOMY     partial   . APPENDECTOMY    . CARDIAC CATHETERIZATION N/A 12/24/2014   Procedure: Right/Left Heart Cath and Coronary Angiography;   Surgeon: Sanda Klein, MD;  Location: [facility];  Service: Cardiovascular;  Laterality: N/A;  . CHOLECYSTECTOMY    . FRACTURE SURGERY    . HEMORRHOID SURGERY    . RETINAL DETACHMENT SURGERY    . TONSILLECTOMY       OB History   None      Home Medications    Prior to Admission medications   Medication Sig Start Date End Date Taking? Authorizing Provider  aspirin 325 MG tablet Take 325 mg by mouth daily with breakfast.    Yes [provider]  betamethasone dipropionate (DIPROLENE) 0.05 % cream Apply 1 application topically 2 (two) times daily as needed (rash).    Yes [provider]  carbamazepine (CARBATROL) 200 MG 12 hr capsule Take 200 mg by mouth 2 (two) times daily.    Yes [provider]  Cholecalciferol (VITAMIN D) 2000 UNITS tablet Take 2,000 Units by mouth daily with breakfast.    Yes [provider]  clobetasol (TEMOVATE) 0.05 % GEL Apply 1 application topically 2 (two) times daily as needed (rash).    Yes [provider]  diltiazem (CARTIA XT) 300 MG 24 hr capsule Take 300 mg by mouth at bedtime.    Yes [provider]  donepezil (ARICEPT) 10 MG tablet Take 10 mg by mouth at bedtime.    Yes [provider]  esomeprazole (NEXIUM) 40 MG capsule Take 40 mg by mouth at bedtime.    Yes [provider]  folic  acid (FOLVITE) 1 MG tablet Take 1 mg by mouth daily.   Yes [provider]  gabapentin (NEURONTIN) 300 MG capsule Take 300 mg by mouth 2 (two) times daily.   Yes [provider]  HUMALOG 100 UNIT/ML injection INJECT 0.78ML (78UNITS) INTO THE SKIN VIA V-GO PUMP AS DIRECTED 11/22/16  Yes Elayne Snare, MD  Insulin Disposable Pump (V-GO 40) KIT USE ONCE DAILY AS DIRECTED Patient taking differently: USE ONCE DAILY AS DIRECTED WITH HUMALOG 03/22/17  Yes Elayne Snare, MD  INVOKANA 300 MG TABS tablet TAKE ONE TABLET BY MOUTH DAILY BEFORE BREAKFAST 04/25/17  Yes Elayne Snare, MD  levothyroxine  (SYNTHROID, LEVOTHROID) 75 MCG tablet Take 75 mcg by mouth daily before breakfast.    Yes [provider]  liraglutide (VICTOZA) 18 MG/3ML SOPN Inject 0.3 mLs (1.8 mg total) into the skin daily. 02/24/17  Yes Elayne Snare, MD  methotrexate (RHEUMATREX) 2.5 MG tablet Take 7.5 mg by mouth every Monday. For rash   Yes [provider]  OLANZapine (ZYPREXA) 5 MG tablet Take 5 mg by mouth at bedtime.   Yes [provider]  ondansetron (ZOFRAN) 8 MG tablet Take 1 tablet (8 mg total) by mouth every 8 (eight) hours as needed for nausea. 05/08/12  Yes Dorie Rank, MD  pravastatin (PRAVACHOL) 20 MG tablet Take 20 mg by mouth daily.   Yes [provider]  spironolactone (ALDACTONE) 50 MG tablet Take 1 tablet (50 mg total) by mouth daily. NEED OV. 03/10/17  Yes Croitoru, Mihai, MD    Family History Family History  Problem Relation Age of Onset  . Heart disease Mother   . Stroke Father   . Parkinson's disease Father   . Cancer Sister   . Cancer Brother     Social History Social History   Tobacco Use  . Smoking status: Never Smoker  . Smokeless tobacco: Never Used  Substance Use Topics  . Alcohol use: No  . Drug use: No     Allergies   Flagyl [metronidazole hcl]; Ciprofloxacin; Metformin and related; Milk-related compounds; and Other   Review of Systems Review of Systems  All other systems reviewed and are negative.    Physical Exam Updated Vital Signs BP 129/63   Pulse 82   Temp 98.7 F (37.1 C) (Oral)   Resp 19   Ht _0  (1.6 m)   SpO2 98%   BMI 40.57 kg/m   Physical Exam  Constitutional: She is oriented to person, place, and time. She appears well-developed and well-nourished.  HENT:  Head: Normocephalic and atraumatic.  Eyes: Conjunctivae are normal.  Neck: Neck supple.  Cardiovascular: Normal rate and regular rhythm.  Difficult to appreciate rub in the emergency department  Pulmonary/Chest: Effort normal and breath sounds normal.    Abdominal: Soft. Bowel sounds are normal.  Musculoskeletal: Normal range of motion.  Neurological: She is alert and oriented to person, place, and time.  Skin: Skin is warm and dry.  Psychiatric: She has a normal mood and affect. Her behavior is normal.  Nursing note and vitals reviewed.    ED Treatments / Results  Labs (all labs ordered are listed, but only abnormal results are displayed) Labs Reviewed  BASIC METABOLIC PANEL - Abnormal; Notable for the following components:      Result Value   Sodium 134 (*)    Chloride 100 (*)    Glucose, Bld 135 (*)    All other components within normal limits  CBC - Abnormal; Notable for  the following components:   WBC 11.2 (*)    Hemoglobin 11.8 (*)    MCH 25.0 (*)    RDW 15.9 (*)    Platelets 625 (*)    All other components within normal limits  CBC  CREATININE, SERUM  BASIC METABOLIC PANEL  CBC  I-STAT TROPONIN, ED    EKG None  Radiology Dg Chest 2 View  Result Date: 05/04/2017 CLINICAL DATA:  BILATERAL pleural effusions. EXAM: CHEST - 2 VIEW COMPARISON:  CT chest 04/24/2017. FINDINGS: Increased cardiac silhouette reflects pericardial effusion. Large LEFT pleural effusion with compressive atelectasis. Small RIGHT effusion best visualized on the lateral view. No definite consolidation. No pneumothorax. No osseous findings. IMPRESSION: Pericardial effusion and BILATERAL pleural effusions as described, reflect the findings noted on recent CT. Electronically Signed   By: Staci Righter M.D.   On: 05/04/2017 16:57   Ct Angio Chest Pe W Or Wo Contrast  Result Date: 05/04/2017 CLINICAL DATA:  Severe shortness of breath beginning 04/21/2017. EXAM: CT ANGIOGRAPHY CHEST WITH CONTRAST TECHNIQUE: Multidetector CT imaging of the chest was performed using the standard protocol during bolus administration of intravenous contrast. Multiplanar CT image reconstructions and MIPs were obtained to evaluate the vascular anatomy. CONTRAST:  21m  ISOVUE-370 IOPAMIDOL (ISOVUE-370) INJECTION 76% COMPARISON:  Radiography 03/26/2017. FINDINGS: Cardiovascular: Pulmonary arterial opacification is excellent. There are no pulmonary emboli. There is aortic atherosclerosis but no aneurysm. No coronary artery calcification is seen. Heart size is normal. There is a large pericardial effusion. Mediastinum/Nodes: No mass or lymphadenopathy. Lungs/Pleura: Bilateral pleural effusions, moderate size on the left and small on the right, layering dependently with dependent pulmonary atelectasis. Upper Abdomen: Negative.  Previous cholecystectomy. Musculoskeletal: Minimal spinal degenerative changes. No significant bone finding. Review of the MIP images confirms the above findings. IMPRESSION: No pulmonary emboli. Large pericardial effusion. Moderate sized left pleural effusion and small right pleural effusion layering dependently with associated dependent pulmonary atelectasis. Aortic atherosclerosis. Electronically Signed   By: MNelson ChimesM.D.   On: 05/04/2017 15:12    Procedures Procedures (including critical care time)  Medications Ordered in ED Medications  aspirin tablet 325 mg (has no administration in time range)  bisoprolol (ZEBETA) tablet 5 mg (has no administration in time range)  carbamazepine (TEGRETOL XR) 12 hr tablet 200 mg (has no administration in time range)  diltiazem (CARDIZEM CD) 24 hr capsule 240 mg (has no administration in time range)  donepezil (ARICEPT) tablet 10 mg (has no administration in time range)  pantoprazole (PROTONIX) EC tablet 40 mg (has no administration in time range)  gabapentin (NEURONTIN) capsule 300 mg (has no administration in time range)  levothyroxine (SYNTHROID, LEVOTHROID) tablet 75 mcg (has no administration in time range)  OLANZapine (ZYPREXA) tablet 5 mg (has no administration in time range)  pravastatin (PRAVACHOL) tablet 20 mg (has no administration in time range)  nitroGLYCERIN (NITROSTAT) SL tablet 0.4 mg  (has no administration in time range)  acetaminophen (TYLENOL) tablet 650 mg (has no administration in time range)  ondansetron (ZOFRAN) injection 4 mg (has no administration in time range)  heparin injection 5,000 Units (has no administration in time range)  insulin aspart (novoLOG) injection 0-15 Units (has no administration in time range)     Initial Impression / Assessment and Plan / ED Course  I have reviewed the triage vital signs and the nursing notes.  Pertinent labs & imaging results that were available during my care of the patient were reviewed by me and considered in my medical decision  making (see chart for details).     Patient presents with persistent dyspnea and chest pain.  CT angiogram reveals a pericardial effusion and bilateral pleural effusions.  Echocardiogram is pending.  Patient will be admitted to the cardiology service.  Final Clinical Impressions(s) / ED Diagnoses   Final diagnoses:  Pericardial effusion    ED Discharge Orders    None       Nat Christen, MD 05/04/17 1929

## 2017-05-04 NOTE — ED Notes (Signed)
Son, [name], at bedside.

## 2017-05-04 NOTE — ED Triage Notes (Signed)
Pt c/o increased shortness of breath x 3 weeks and intermittent chest pain. Pt denies chest pain at this time. Pt had a CTA to check for PE earlier today. LS equal/diminished.

## 2017-05-04 NOTE — Progress Notes (Signed)
  Echocardiogram 2D Echocardiogram has been performed.  Delcie RochENNINGTON, Ziair Penson 05/04/2017, 6:10 PM

## 2017-05-04 NOTE — ED Notes (Signed)
Pt transported to X-ray from triage. Imaging will bring pt back to room E37 when finished with X-ray.

## 2017-05-04 NOTE — ED Notes (Addendum)
Pt given bag lunch and water. 

## 2017-05-04 NOTE — H&P (Signed)
Cardiology  History and Physical    Patient ID: [name]; [id number]; 12-22-1940   Admit date: 05/04/2017 Date of Consult: 05/04/2017  Primary Care Provider: Merrilee Seashore, MD Primary Cardiologist: Dr. Sallyanne Kuster Chief Complaint: Shortness of breath   Patient Profile:   [name] is a 77 y.o. female with no known hx of CAD per cath and normal nuc stress in 2012/2019,  however does have a hx of obesity, type 2 diabetes mellitus, previous history of TIA, possible ischemic colitis and hypertension who is being seen today for the evaluation of shortness of breath with CTA positive for large pericardial effusion and left pleural effusions on 05/04/17 ordered as OP by Dr. Sallyanne Kuster.   History of Present Illness:   [name] is a 77yo F with a hx as stated above who presented to the ED on 05/04/17 for further evaluation of the large pericardial effusion found on OP CTA.    Pt called to the office on 05/02/17 with c/o worsening SOB with inspiration. According to the patient, this has been on ongoing concern since a fall sustained in early March 2019. At that time, she presented to the ED for shoulder and chest pain after the fall. An EKG was performed which initially was suspicious for a STEMI however, a second EKG was normal. She had normal CE's (<0.03). She was evaluated and ultimately discharge home. She was seen in our office last on 04/19/17. Unsure of the definitive plan at that time however, a Lexiscan stress study was ordered as an OP and completed on 04/21/17 which was found to be normal.   Given her phone encounter complaints, a chest CTA was ordered to rule out PE. Unfortunately, the CTA revealed a large pericardial effusion with no evidence of PE. Subsequently, the pt was sent to the ED and a STAT echocardiogram was ordered to deifinatively evaluate the size of the effusion.   In the ED, trop levels have been negative. EKG shows inferolateral T-wave inversions. Pt is  stable at the time of interview and was actually in the waiting room at the time of initial encounter. CXR with pericardial effusion and bilateral pleural effusions. WBC count mildy elevated at 11.2. Denies recent infection or illness. Denies chest pain, palpitations, orthopnea, dizziness, LE swelling or syncope. Reports feeling "bad" since her fall in March of this year with progressively worsening exertional SOB.    Past Medical History:  Diagnosis Date  . Depression    Bipolar  . Diabetes mellitus    diet controlled  . GERD (gastroesophageal reflux disease)   . Hyperlipidemia   . Hypertension   . Ischemic colitis, enteritis, or enterocolitis ([place])   . Sleep apnea   . Stroke ([place])    x's 2  . Thyroid disease   . Urinary bladder incontinence   . Vertigo     Past Surgical History:  Procedure Laterality Date  . ABDOMINAL HYSTERECTOMY     partial   . APPENDECTOMY    . CARDIAC CATHETERIZATION N/A 12/24/2014   Procedure: Right/Left Heart Cath and Coronary Angiography;  Surgeon: Sanda Klein, MD;  Location: [facility];  Service: Cardiovascular;  Laterality: N/A;  . CHOLECYSTECTOMY    . FRACTURE SURGERY    . HEMORRHOID SURGERY    . RETINAL DETACHMENT SURGERY    . TONSILLECTOMY       Prior to Admission medications   Medication Sig Start Date End Date Taking? Authorizing Provider  aspirin 325 MG tablet Take 325  mg by mouth daily with breakfast.     [provider]  betamethasone dipropionate (DIPROLENE) 0.05 % cream Apply 1 application topically 2 (two) times daily as needed (rash).     [provider]  bisoprolol (ZEBETA) 10 MG tablet Take 10 mg by mouth daily.    [provider]  carbamazepine (CARBATROL) 200 MG 12 hr capsule Take 200 mg by mouth 2 (two) times daily.     [provider]  Cholecalciferol (VITAMIN D) 2000 UNITS tablet Take 2,000 Units by mouth daily with breakfast.     [provider]  clobetasol (TEMOVATE) 0.05  % GEL Apply 1 application topically 2 (two) times daily as needed (rash).     [provider]  diltiazem (CARTIA XT) 300 MG 24 hr capsule Take 300 mg by mouth at bedtime.     [provider]  donepezil (ARICEPT) 10 MG tablet Take 10 mg by mouth at bedtime.     [provider]  esomeprazole (NEXIUM) 40 MG capsule Take 40 mg by mouth at bedtime.     [provider]  folic acid (FOLVITE) 1 MG tablet Take 1 mg by mouth daily.    [provider]  gabapentin (NEURONTIN) 300 MG capsule Take 300 mg by mouth 2 (two) times daily.    [provider]  HUMALOG 100 UNIT/ML injection INJECT 0.78ML (78UNITS) INTO THE SKIN VIA V-GO PUMP AS DIRECTED 11/22/16   Elayne Snare, MD  Insulin Disposable Pump (V-GO 40) KIT USE ONCE DAILY AS DIRECTED Patient taking differently: USE ONCE DAILY AS DIRECTED WITH HUMALOG 03/22/17   Elayne Snare, MD  Insulin Pen Needle 32G X 4 MM MISC Use 8 pen needles per day 02/08/17   Elayne Snare, MD  Insulin Syringe-Needle U-100 (BD INSULIN SYRINGE SAFETYGLIDE) 31G X 15/64" 1 ML MISC Use 1 syringe to inject insulin into V-Go pump daily 08/20/16   Elayne Snare, MD  INVOKANA 300 MG TABS tablet TAKE ONE TABLET BY MOUTH DAILY BEFORE BREAKFAST 04/25/17   Elayne Snare, MD  isosorbide mononitrate (IMDUR) 30 MG 24 hr tablet Take 1 tablet (30 mg total) by mouth daily. 04/27/17   Croitoru, Mihai, MD  levothyroxine (SYNTHROID, LEVOTHROID) 75 MCG tablet Take 75 mcg by mouth daily before breakfast.     [provider]  liraglutide (VICTOZA) 18 MG/3ML SOPN Inject 0.3 mLs (1.8 mg total) into the skin daily. 02/24/17   Elayne Snare, MD  methotrexate (RHEUMATREX) 2.5 MG tablet Take 7.5 mg by mouth every Monday. For rash    [provider]  OLANZapine (ZYPREXA) 5 MG tablet Take 5 mg by mouth at bedtime.    [provider]  ondansetron (ZOFRAN) 8 MG tablet Take 1 tablet (8 mg total) by mouth every 8 (eight) hours as needed for nausea. 05/08/12    Dorie Rank, MD  [facility] DELICA LANCETS 67R MISC USE LANCET TO TEST FIVE TIMES A DAY 01/19/17   Elayne Snare, MD  [facility] VERIO test strip USE TO TEST BLOOD SUGAR 3 TIMES DAILY 12/08/16   Elayne Snare, MD  pravastatin (PRAVACHOL) 20 MG tablet Take 20 mg by mouth daily.    [provider]  spironolactone (ALDACTONE) 50 MG tablet Take 1 tablet (50 mg total) by mouth daily. NEED OV. 03/10/17   Croitoru, Dani Gobble, MD    Inpatient Medications: Scheduled Meds:  Continuous Infusions:  PRN Meds:   Allergies:    Allergies  Allergen Reactions  . Flagyl [Metronidazole Hcl] Itching and Rash  .  Ciprofloxacin Itching and Rash  . Metformin And Related Rash  . Milk-Related Compounds Other (See Comments)    Stomach pains  . Other Other (See Comments)    [place] nuts cause severe facial redness    Social History:   Social History   Socioeconomic History  . Marital status: Widowed    Spouse name: Not on file  . Number of children: 2  . Years of education: 15  . Highest education level: Not on file  Occupational History  . Occupation: Retired  Scientific laboratory technician  . Financial resource strain: Not on file  . Food insecurity:    Worry: Not on file    Inability: Not on file  . Transportation needs:    Medical: Not on file    Non-medical: Not on file  Tobacco Use  . Smoking status: Never Smoker  . Smokeless tobacco: Never Used  Substance and Sexual Activity  . Alcohol use: No  . Drug use: No  . Sexual activity: Not on file  Lifestyle  . Physical activity:    Days per week: Not on file    Minutes per session: Not on file  . Stress: Not on file  Relationships  . Social connections:    Talks on phone: Not on file    Gets together: Not on file    Attends religious service: Not on file    Active member of club or organization: Not on file    Attends meetings of clubs or organizations: Not on file    Relationship status: Not on file  . Intimate partner violence:    Fear of current or  ex partner: Not on file    Emotionally abused: Not on file    Physically abused: Not on file    Forced sexual activity: Not on file  Other Topics Concern  . Not on file  Social History Narrative   Lives at home alone.   Right-handed.   No caffeine use.    Family History:   Family History  Problem Relation Age of Onset  . Heart disease Mother   . Stroke Father   . Parkinson's disease Father   . Cancer Sister   . Cancer Brother    Family Status:  Family Status  Relation Name Status  . Mother  Alive  . Father  Deceased at age 56  . Sister 5 Alive  . Brother  (Not Specified)    ROS:  Please see the history of present illness.  All other ROS reviewed and negative.     Physical Exam/Data:   Vitals:   05/04/17 1600 05/04/17 1601 05/04/17 1833 05/04/17 1834  BP: 118/61  (!) 130/41 (!) 130/41  Pulse: 85  83 84  Resp: 18  (!) 22 18  Temp: 98.7 F (37.1 C)     TempSrc: Oral     SpO2: 98%  95% 95%  Height:  '5\' 3"'$  (1.6 m)     No intake or output data in the 24 hours ending 05/04/17 1849 There were no vitals filed for this visit. Body mass index is 40.57 kg/m.   General: Well developed, well nourished, NAD Skin: Warm, dry, intact  Head: Normocephalic, atraumatic, clear, moist mucus membranes. Neck: Negative for carotid bruits. No JVD Lungs:Clear to ausculation bilaterally. No wheezes, rales, or rhonchi. Breathing is mildly labored Cardiovascular: RRR with S1 S2. No murmurs, rubs, or gallops. Negative pulsus paradoxus  Abdomen: Soft, non-tender, non-distended with normoactive bowel sounds.  No obvious abdominal masses. MSK:  Strength and tone appear normal for age. 5/5 in all extremities Extremities: 1 + BLE edema. No clubbing or cyanosis. DP/PT pulses 2+ bilaterally Neuro: Alert and oriented. No focal deficits. No facial asymmetry. MAE spontaneously. Psych: Responds to questions appropriately with normal affect, no focal abnormalities noted Psych:  Normal affect    EKG:  The EKG was personally reviewed and demonstrates: NSR with T wave inversion in inferolateral leads  Telemetry:  Telemetry was personally reviewed and demonstrates: Not on telemetry   Relevant CV Studies:  ECHO: Pending   CATH: 12/24/14 IMPRESSIONS:  Normal coronary arteries. Normal hemodynamics/filling pressures.   RECOMMENDATION:  Evaluate for a non-cardiac cause of symptoms  Nuc stress 04/22/17:   Normal perfusion No ischemia or scar  Nuclear stress EF: 70%.  This is a low risk study.  Laboratory Data:  Chemistry Recent Labs  Lab 05/03/17 0830 05/04/17 1615  NA 136 134*  K 5.1 4.7  CL 98 100*  CO2 22 22  GLUCOSE 170* 135*  BUN 11 8  CREATININE 0.86 0.85  CALCIUM 9.1 9.0  GFRNONAA 66 >60  GFRAA 76 >60  ANIONGAP  --  12    Total Protein  Date Value Ref Range Status  03/26/2017 7.1 6.5 - 8.1 g/dL Final   Albumin  Date Value Ref Range Status  03/26/2017 3.5 3.5 - 5.0 g/dL Final   AST  Date Value Ref Range Status  03/26/2017 24 15 - 41 U/L Final   ALT  Date Value Ref Range Status  03/26/2017 19 14 - 54 U/L Final   Alkaline Phosphatase  Date Value Ref Range Status  03/26/2017 86 38 - 126 U/L Final   Total Bilirubin  Date Value Ref Range Status  03/26/2017 0.2 (L) 0.3 - 1.2 mg/dL Final   Hematology Recent Labs  Lab 05/04/17 1615  WBC 11.2*  RBC 4.72  HGB 11.8*  HCT 37.3  MCV 79.0  MCH 25.0*  MCHC 31.6  RDW 15.9*  PLT 625*   Cardiac EnzymesNo results for input(s): TROPONINI in the last 168 hours.  Recent Labs  Lab 05/04/17 1620  TROPIPOC 0.03    BNPNo results for input(s): BNP, PROBNP in the last 168 hours.  DDimer No results for input(s): DDIMER in the last 168 hours. TSH:  Lab Results  Component Value Date   TSH 1.32 12/19/2015   Lipids: Lab Results  Component Value Date   CHOL 149 03/26/2017   HDL 50 03/26/2017   LDLCALC 73 03/26/2017   TRIG 130 03/26/2017   CHOLHDL 3.0 03/26/2017   HgbA1c: Lab Results   Component Value Date   HGBA1C 7.2 (H) 02/22/2017    Radiology/Studies:  Dg Chest 2 View  Result Date: 05/04/2017 CLINICAL DATA:  BILATERAL pleural effusions. EXAM: CHEST - 2 VIEW COMPARISON:  CT chest 04/24/2017. FINDINGS: Increased cardiac silhouette reflects pericardial effusion. Large LEFT pleural effusion with compressive atelectasis. Small RIGHT effusion best visualized on the lateral view. No definite consolidation. No pneumothorax. No osseous findings. IMPRESSION: Pericardial effusion and BILATERAL pleural effusions as described, reflect the findings noted on recent CT. Electronically Signed   By: Staci Righter M.D.   On: 05/04/2017 16:57   Ct Angio Chest Pe W Or Wo Contrast  Result Date: 05/04/2017 CLINICAL DATA:  Severe shortness of breath beginning 04/21/2017. EXAM: CT ANGIOGRAPHY CHEST WITH CONTRAST TECHNIQUE: Multidetector CT imaging of the chest was performed using the standard protocol during bolus administration of intravenous contrast. Multiplanar CT image reconstructions and MIPs were obtained to  evaluate the vascular anatomy. CONTRAST:  35m ISOVUE-370 IOPAMIDOL (ISOVUE-370) INJECTION 76% COMPARISON:  Radiography 03/26/2017. FINDINGS: Cardiovascular: Pulmonary arterial opacification is excellent. There are no pulmonary emboli. There is aortic atherosclerosis but no aneurysm. No coronary artery calcification is seen. Heart size is normal. There is a large pericardial effusion. Mediastinum/Nodes: No mass or lymphadenopathy. Lungs/Pleura: Bilateral pleural effusions, moderate size on the left and small on the right, layering dependently with dependent pulmonary atelectasis. Upper Abdomen: Negative.  Previous cholecystectomy. Musculoskeletal: Minimal spinal degenerative changes. No significant bone finding. Review of the MIP images confirms the above findings. IMPRESSION: No pulmonary emboli. Large pericardial effusion. Moderate sized left pleural effusion and small right pleural effusion  layering dependently with associated dependent pulmonary atelectasis. Aortic atherosclerosis. Electronically Signed   By: MNelson ChimesM.D.   On: 05/04/2017 15:12    Assessment and Plan:   1. Large pericardial effusion: -Pt has been having SOB with exertion for the last month. She has had a negative cardiac workup in early March 2019 with negative Lexiscan stress in early April. Due to worsening SOB, now with inspiration on 05/02/17, the pt called the office for further advice. A CTA was ordered to rule out PE and found to have large pericardial effusion and bilateral pleural effusions. Pt sent to the ED on /05/04/17 to have stat echocardiogram and likely Cardiology admission.  -We will plan to admit her and follow results from echo for next steps in our plan. Pt currently stable. Negative pulsus paradoxus per MD. BP stable. We are awaiting labs and will restart home medications. Trop in ED negative. No ACS symptoms. EKG with T-wave inversion, consistent with previous tracing and no acute changes.  -Pt BP stable, no acute SOB or hypoxia, no pulsus paradoxus, no evidence of tamponade  2. Bilateral Pleural effusions: -Per CTA and follow up CXR on 05/04/17 -Pt with SOB but stable  -On RA oxygenation with 95% saturations  3. DM II: -Uncontrolled, last HBA1c=7.2 on 02/22/17 -Will start SSI while in the hospital  -On insulin at home  4. HTN: -Stable, 130/41, 118/61 -Continue current regimen -Will reduce bisoprolol to 521m decrease Diltiazem to 240 mg CD, hold IMDUR, hold spironolactone -Conservative management until clearer evaluation of effusion per echocardiogram    For questions or updates, please contact [facility] Please consult [email or web address] for contact info under Cardiology/STEMI.   [facility] Pager: [phone] 3468/17/2019 6:49 PM  History and all data above reviewed.  Patient examined.  I agree with the findings as above. She presents with what is  described as a large effusion on CT.  She has had dyspnea since March .  She just has not felt right.  She had decreased energy.  She has had no chest pain.  No fevers, no chills.  She has had no weight gain or edema.  She does have DOE walking about 50 yards and she has to stop and catch her breath for a few minutes.  She is not having PND or orthopnea.  She is not having any cough.  She had a CT to rule out PE.    The patient exam reveals COR:RRR, no rub, no murmurms  ,  Lungs: Clear  ,  Abd: Positive bowel sounds, no rebound no guarding, Ext No edema   .  All available labs, radiology testing, previous records reviewed. Agree with documented assessment and plan. SOB:  She has effusion on chest CT.  However, she is not hypotensive, has no acute dyspnea  and has no pulsus paradox.  She has no evidence of tamponade.  We need to quantify the effusion with echo (CT often over estimates).  HTN:  I will, for tonight, reduce her BP meds until I make sure there is no evidence of large effusion with tamponade physiology.  I will in particular hold the spironolactone.    Kathyrn Drown  6:49 PM  05/04/2017

## 2017-05-04 NOTE — ED Notes (Signed)
Patient back from X-ray and ECHO

## 2017-05-05 ENCOUNTER — Inpatient Hospital Stay (HOSPITAL_COMMUNITY): Payer: Medicare Other

## 2017-05-05 ENCOUNTER — Other Ambulatory Visit: Payer: Self-pay

## 2017-05-05 DIAGNOSIS — I10 Essential (primary) hypertension: Secondary | ICD-10-CM

## 2017-05-05 DIAGNOSIS — E1169 Type 2 diabetes mellitus with other specified complication: Secondary | ICD-10-CM

## 2017-05-05 DIAGNOSIS — I313 Pericardial effusion (noninflammatory): Secondary | ICD-10-CM

## 2017-05-05 DIAGNOSIS — E7800 Pure hypercholesterolemia, unspecified: Secondary | ICD-10-CM

## 2017-05-05 DIAGNOSIS — E669 Obesity, unspecified: Secondary | ICD-10-CM

## 2017-05-05 LAB — CBC
HEMATOCRIT: 36.6 % (ref 36.0–46.0)
Hemoglobin: 11.3 g/dL — ABNORMAL LOW (ref 12.0–15.0)
MCH: 24.5 pg — ABNORMAL LOW (ref 26.0–34.0)
MCHC: 30.9 g/dL (ref 30.0–36.0)
MCV: 79.4 fL (ref 78.0–100.0)
PLATELETS: 599 10*3/uL — AB (ref 150–400)
RBC: 4.61 MIL/uL (ref 3.87–5.11)
RDW: 16.2 % — AB (ref 11.5–15.5)
WBC: 12.6 10*3/uL — AB (ref 4.0–10.5)

## 2017-05-05 LAB — T4, FREE: Free T4: 1.04 ng/dL (ref 0.61–1.12)

## 2017-05-05 LAB — TSH: TSH: 2.248 u[IU]/mL (ref 0.350–4.500)

## 2017-05-05 LAB — BASIC METABOLIC PANEL
Anion gap: 9 (ref 5–15)
BUN: 10 mg/dL (ref 6–20)
CALCIUM: 8.7 mg/dL — AB (ref 8.9–10.3)
CO2: 24 mmol/L (ref 22–32)
CREATININE: 0.88 mg/dL (ref 0.44–1.00)
Chloride: 102 mmol/L (ref 101–111)
GFR calc Af Amer: >60 mL/min (ref >60)
GLUCOSE: 113 mg/dL — AB (ref 65–99)
POTASSIUM: 4.6 mmol/L (ref 3.5–5.1)
SODIUM: 135 mmol/L (ref 135–145)

## 2017-05-05 LAB — GLUCOSE, CAPILLARY
GLUCOSE-CAPILLARY: 128 mg/dL — AB (ref 65–99)
GLUCOSE-CAPILLARY: 106 mg/dL — AB (ref 65–99)
GLUCOSE-CAPILLARY: 122 mg/dL — AB (ref 65–99)
Glucose-Capillary: 149 mg/dL — ABNORMAL HIGH (ref 65–99)

## 2017-05-05 LAB — SEDIMENTATION RATE: SED RATE: 64 mm/h — AB (ref 0–22)

## 2017-05-05 LAB — C-REACTIVE PROTEIN: CRP: 17.3 mg/dL — AB (ref <1.0)

## 2017-05-05 MED ORDER — IBUPROFEN 800 MG PO TABS
800.0000 mg | ORAL_TABLET | Freq: Three times a day (TID) | ORAL | Status: DC
  Administered 2017-05-05 – 2017-05-17 (×37): 800 mg via ORAL
  Filled 2017-05-05 (×37): qty 1

## 2017-05-05 MED ORDER — GADOBENATE DIMEGLUMINE 529 MG/ML IV SOLN
40.0000 mL | Freq: Once | INTRAVENOUS | Status: AC
  Administered 2017-05-05: 40 mL via INTRAVENOUS

## 2017-05-05 MED ORDER — GLUCERNA SHAKE PO LIQD
237.0000 mL | Freq: Two times a day (BID) | ORAL | Status: DC
  Administered 2017-05-05 – 2017-05-12 (×8): 237 mL via ORAL
  Filled 2017-05-05 (×3): qty 237

## 2017-05-05 MED ORDER — INSULIN ASPART 100 UNIT/ML ~~LOC~~ SOLN: 0.0000 [IU] | Freq: Every day | SUBCUTANEOUS | Status: DC

## 2017-05-05 MED ORDER — INSULIN ASPART 100 UNIT/ML ~~LOC~~ SOLN: 0.0000 [IU] | Freq: Three times a day (TID) | SUBCUTANEOUS | Status: DC

## 2017-05-05 MED ORDER — COLCHICINE 0.6 MG PO TABS
0.6000 mg | ORAL_TABLET | Freq: Two times a day (BID) | ORAL | Status: DC
  Administered 2017-05-05 – 2017-05-12 (×15): 0.6 mg via ORAL
  Filled 2017-05-05 (×15): qty 1

## 2017-05-05 MED ORDER — INSULIN PUMP
Freq: Three times a day (TID) | SUBCUTANEOUS | Status: DC
  Administered 2017-05-05 – 2017-05-17 (×31): via SUBCUTANEOUS
  Administered 2017-05-17: 6 via SUBCUTANEOUS
  Filled 2017-05-05: qty 1

## 2017-05-05 NOTE — Progress Notes (Signed)
Initial Nutrition Assessment  DOCUMENTATION CODES:   Obesity unspecified  INTERVENTION:    Glucerna Shake po BID, each supplement provides 220 kcal and 10 grams of protein  NUTRITION DIAGNOSIS:   Inadequate oral intake related to poor appetite as evidenced by meal completion < 50%, per patient/family report.  GOAL:   Patient will meet greater than or equal to 90% of their needs  MONITOR:   PO intake, Supplement acceptance  REASON FOR ASSESSMENT:   Malnutrition Screening Tool    ASSESSMENT:   77 yo female with PMH of thyroid DZ, HTN, CAD, obesity, ischemic colitis, sleep apnea, GERD, HLD, DM, stroke, and Bipolar depression who was admitted on 4/17 with large pericardial effusion.  Patient reports that she has been eating poorly for the past 1-2 months due to poor appetite. She has lost weight, "but it hasn't hurt me any." She has been forcing herself to eat, but has been eating less than usual. She agreed to drink Glucerna Shakes BID to help ensure adequate intake of protein and calories.   Labs and medications reviewed. CBG's: 128-106  NUTRITION - FOCUSED PHYSICAL EXAM:    Most Recent Value  Orbital Region  No depletion  Upper Arm Region  No depletion  Thoracic and Lumbar Region  No depletion  Buccal Region  No depletion  Temple Region  No depletion  Clavicle Bone Region  No depletion  Clavicle and Acromion Bone Region  No depletion  Scapular Bone Region  No depletion  Dorsal Hand  No depletion  Patellar Region  No depletion  Anterior Thigh Region  No depletion  Posterior Calf Region  No depletion  Edema (RD Assessment)  Mild  Hair  Reviewed  Eyes  Reviewed  Mouth  Reviewed  Skin  Reviewed  Nails  Reviewed       Diet Order:  Diet heart healthy/carb modified Room service appropriate? Yes; Fluid consistency: Thin  EDUCATION NEEDS:   No education needs have been identified at this time  Skin:  Skin Assessment: Reviewed RN Assessment  Last BM:   4/17  Height:   Ht Readings from Last 1 Encounters:  05/04/17 5\' 3"  (1.6 m)    Weight:   Wt Readings from Last 1 Encounters:  05/05/17 218 lb 11.2 oz (99.2 kg)    Ideal Body Weight:  52.3 kg  BMI:  Body mass index is 38.74 kg/m.  Estimated Nutritional Needs:   Kcal:  1550-1750  Protein:  90-105 gm  Fluid:  1.8 L    Joaquin CourtsKimberly Buelah Rennie, RD, LDN, CNSC Pager [phone] After Hours Pager [phone]

## 2017-05-05 NOTE — Progress Notes (Signed)
Inpatient Diabetes Program Recommendations  AACE/ADA: New Consensus Statement on Inpatient Glycemic Control (2015)  Target Ranges:  Prepandial:   less than 140 mg/dL      Peak postprandial:   less than 180 mg/dL (1-2 hours)      Critically ill patients:  140 - 180 mg/dL    Review of Glycemic Control  Diabetes history: DM2 Outpatient Diabetes medications: V-Go 40 insulin pump with Humalog insulin (provides 40 units for basal insulin per 24H and takes 10 units with meals), Invokana 300 mg QAM, Victoza 1.8 mg daily Current orders for Inpatient glycemic control: Novolog 0-15 units TID  Inpatient Diabetes Program Recommendations:  Insulin Pump:  Please consider ordering insulin pump order set since patient is using her V-Go (disposable insulin pump) for basal and meal coverage. Correction (SSI): Please consider ordering Novolog 0-5 units QHS for bedtime correction if needed. Insulin - Meal Coverage: Patient has on her V-Go insulin pump and she plans to continue covering carbohydrates when eating just as she does as an outpatient. Other Outpatient DM meds: Do not recommend ordering Invokana and Victoza while inpatient; patient can resume when discharged.  NOTE: Noted consult for Inpatient Diabetes Coordinator. Chart reviewed. Noted patient is followed by Dr. Lucianne MussKumar and was last seen on 02/24/2017 and A1C was 7.2% on 02/22/2017. Spoke with patient over the phone and she reports that she currently has on her V-Go insulin pump and she plans to continue using it and will bolus for meals just as she does as an outpatient. The V-Go insulin pump is a disposable insulin pump that provides a set basal dose (her pump provides a total of 40 units for basal/24 hours) and allows patient to give additional units for meal coverage (patient clicks 5 times with each meal; each click provides 2 units of insulin; therefore she is taking a total of 10 units with meals). Explained to patient that it would be requested that the  insulin pump order set be ordered and that Novolog correction scale would also be continued since patient's insulin pump does not allow her to bolus for elevated glucose. Patient reports that she does not have any extra insulin pump supplies at the hospital currently but plans to have her son bring in her insulin pump supplies. Patient states that her current V-Go insulin pump she has on will last her until tomorrow morning.  Patient verbalized understanding of information discussed. Will continue to follow while inpatient. If for some reason, patient's insulin pump was removed (and not restarted) or stopped, then she will need basal insulin ordered along with meal coverage insulin.  Thanks, Orlando PennerMarie Trinten Boudoin, RN, MSN, CDE Diabetes Coordinator Inpatient Diabetes Program [phone] (Team Pager from 8am to 5pm)

## 2017-05-05 NOTE — Progress Notes (Signed)
Progress Note  Patient Name: [name] Date of Encounter: 05/05/2017  Primary Cardiologist: No primary care provider on file.   Subjective   Still short of breath but feeling better.  No chest pain or pressure.   Inpatient Medications    Scheduled Meds: . aspirin  325 mg Oral Q breakfast  . bisoprolol  5 mg Oral Daily  . carbamazepine  200 mg Oral BID  . diltiazem  240 mg Oral QHS  . donepezil  10 mg Oral QHS  . gabapentin  300 mg Oral BID  . heparin  5,000 Units Subcutaneous Q8H  . insulin aspart  0-15 Units Subcutaneous TID WC  . levothyroxine  75 mcg Oral QAC breakfast  . OLANZapine  5 mg Oral QHS  . pantoprazole  40 mg Oral Daily  . pravastatin  20 mg Oral Daily   Continuous Infusions:  PRN Meds: acetaminophen, nitroGLYCERIN, ondansetron (ZOFRAN) IV   Vital Signs    Vitals:   05/04/17 2130 05/04/17 2332 05/05/17 0524 05/05/17 0529  BP: (!) 111/53 140/77  (!) 142/76  Pulse: 82 88  91  Resp: (!) 22   (!) 26  Temp:  97.6 F (36.4 C)  99.1 F (37.3 C)  TempSrc:  Oral  Oral  SpO2: 95% 93%  91%  Weight:  219 lb 6.4 oz (99.5 kg) 218 lb 11.2 oz (99.2 kg)   Height:  5' 3"  (1.6 m)      Intake/Output Summary (Last 24 hours) at 05/05/2017 0942 Last data filed at 05/05/2017 0515 Gross per 24 hour  Intake -  Output 200 ml  Net -200 ml   Filed Weights   05/04/17 2332 05/05/17 0524  Weight: 219 lb 6.4 oz (99.5 kg) 218 lb 11.2 oz (99.2 kg)    Telemetry    Sinus rhythm.   - Personally Reviewed  ECG    Sinus rhythm.  Rate 84 bpm.  Non-specific T wave abnormalities.  - Personally Reviewed  Physical Exam   VS:  BP (!) 142/76 (BP Location: Left Arm)   Pulse 91   Temp 99.1 F (37.3 C) (Oral)   Resp (!) 26   Ht 5' 3"  (1.6 m)   Wt 218 lb 11.2 oz (99.2 kg)   SpO2 91%   BMI 38.74 kg/m  , BMI Body mass index is 38.74 kg/m. GENERAL:  Well appearing.  Mild dyspnea HEENT: Pupils equal round and reactive, fundi not visualized, oral mucosa  unremarkable NECK:  No jugular venous distention, waveform within normal limits, carotid upstroke brisk and symmetric, no bruits LUNGS:  Clear to auscultation bilaterally. No crackles, wheezes or rhonchi. HEART:  RRR.  PMI not displaced or sustained,S1 and S2 within normal limits, no S3, no S4, no clicks, no rubs, no murmurs ABD:  Flat, positive bowel sounds normal in frequency in pitch, no bruits, no rebound, no guarding, no midline pulsatile mass, no hepatomegaly, no splenomegaly EXT:  2 plus pulses throughout, no edema, no cyanosis no clubbing SKIN:  No rashes no nodules NEURO:  Cranial nerves II through XII grossly intact, motor grossly intact throughout PSYCH:  Cognitively intact, oriented to person place and time  Labs    Chemistry Recent Labs  Lab 05/03/17 0830 05/04/17 1615 05/04/17 1707 05/05/17 0352  NA 136 134*  --  135  K 5.1 4.7  --  4.6  CL 98 100*  --  102  CO2 22 22  --  24  GLUCOSE 170* 135*  --  113*  BUN 11 8  --  10  CREATININE 0.86 0.85 0.82 0.88  CALCIUM 9.1 9.0  --  8.7*  GFRNONAA 66 >60 >60 >60  GFRAA 76 >60 >60 >60  ANIONGAP  --  12  --  9     Hematology Recent Labs  Lab 05/04/17 1615 05/04/17 1707 05/05/17 0352  WBC 11.2* 10.5 12.6*  RBC 4.72 4.73 4.61  HGB 11.8* 11.9* 11.3*  HCT 37.3 37.9 36.6  MCV 79.0 80.1 79.4  MCH 25.0* 25.2* 24.5*  MCHC 31.6 31.4 30.9  RDW 15.9* 16.2* 16.2*  PLT 625* 672* 599*    Cardiac EnzymesNo results for input(s): TROPONINI in the last 168 hours.  Recent Labs  Lab 05/04/17 1620  TROPIPOC 0.03     BNPNo results for input(s): BNP, PROBNP in the last 168 hours.   DDimer No results for input(s): DDIMER in the last 168 hours.   Radiology    Dg Chest 2 View  Result Date: 05/04/2017 CLINICAL DATA:  BILATERAL pleural effusions. EXAM: CHEST - 2 VIEW COMPARISON:  CT chest 04/24/2017. FINDINGS: Increased cardiac silhouette reflects pericardial effusion. Large LEFT pleural effusion with compressive atelectasis.  Small RIGHT effusion best visualized on the lateral view. No definite consolidation. No pneumothorax. No osseous findings. IMPRESSION: Pericardial effusion and BILATERAL pleural effusions as described, reflect the findings noted on recent CT. Electronically Signed   By: Staci Righter M.D.   On: 05/04/2017 16:57   Ct Angio Chest Pe W Or Wo Contrast  Result Date: 05/04/2017 CLINICAL DATA:  Severe shortness of breath beginning 04/21/2017. EXAM: CT ANGIOGRAPHY CHEST WITH CONTRAST TECHNIQUE: Multidetector CT imaging of the chest was performed using the standard protocol during bolus administration of intravenous contrast. Multiplanar CT image reconstructions and MIPs were obtained to evaluate the vascular anatomy. CONTRAST:  48m ISOVUE-370 IOPAMIDOL (ISOVUE-370) INJECTION 76% COMPARISON:  Radiography 03/26/2017. FINDINGS: Cardiovascular: Pulmonary arterial opacification is excellent. There are no pulmonary emboli. There is aortic atherosclerosis but no aneurysm. No coronary artery calcification is seen. Heart size is normal. There is a large pericardial effusion. Mediastinum/Nodes: No mass or lymphadenopathy. Lungs/Pleura: Bilateral pleural effusions, moderate size on the left and small on the right, layering dependently with dependent pulmonary atelectasis. Upper Abdomen: Negative.  Previous cholecystectomy. Musculoskeletal: Minimal spinal degenerative changes. No significant bone finding. Review of the MIP images confirms the above findings. IMPRESSION: No pulmonary emboli. Large pericardial effusion. Moderate sized left pleural effusion and small right pleural effusion layering dependently with associated dependent pulmonary atelectasis. Aortic atherosclerosis. Electronically Signed   By: MNelson ChimesM.D.   On: 05/04/2017 15:12    Cardiac Studies   Echo 05/05/17: Study Conclusions  - Left ventricle: The cavity size was normal. Systolic function was   normal. The estimated ejection fraction was in the  range of 60%   to 65%. Wall motion was normal; there were no regional wall   motion abnormalities. Doppler parameters are consistent with   abnormal left ventricular relaxation (grade 1 diastolic   dysfunction). - Pericardium, extracardiac: There was moderate thickening. A   moderate pericardial effusion was identified circumferential to   the heart. There was no evidence of hemodynamic compromise. There   was no chamber collapse. There was evidence for mildly increased   RV-LV interaction demonstrated by respirophasic changes in   transmitral velocities. Features were not consistent with   tamponade physiology.  Impressions:  - Findings suggest possible acute pericarditis with marked   pericardial thickening. There is no evidence of tamponade, but  there is subtle evidence of enhanced ventricular interdependence.   Consider a component of constrictive physiology. Cardiac MRI may   offer additional diagnostic information.  Patient Profile     77 y.o. female with diabetes, prior TIA, hypertension, hypothyroidism, and morbid obesity here with shortness of breath and a large pericardial effusion without tamponade.   Assessment & Plan    # Pericardial effusion:  Echo showed a large circumferential pericardial effusion without tamponade.  There was also pericardial thickening.  She denies viral symptoms.  We will check an ESR, CRP, ANA, TSH, free T4, T3.  Also start colchicine and ibuprofen for pericarditis.  She is already on pantoprazole.  We will get a cardiac MRI to better evaluate her pericardium and myocardium.  If labs and MRI are not revealing we may need a pericardiocentesis for diagnostic purposes.  # Hypertension: BP controlled.  Continue bisoporol and diltiazem.   # Hyperlipidemia: Continue pravastatin.   For questions or updates, please contact [place] Please consult [email or web address] for contact info under Cardiology/STEMI.      Signed, Skeet Latch, MD   05/05/2017, 9:42 AM

## 2017-05-06 ENCOUNTER — Encounter (HOSPITAL_COMMUNITY): Payer: Self-pay | Admitting: Cardiovascular Disease

## 2017-05-06 DIAGNOSIS — Z8679 Personal history of other diseases of the circulatory system: Secondary | ICD-10-CM

## 2017-05-06 DIAGNOSIS — I300 Acute nonspecific idiopathic pericarditis: Secondary | ICD-10-CM

## 2017-05-06 DIAGNOSIS — I319 Disease of pericardium, unspecified: Secondary | ICD-10-CM

## 2017-05-06 HISTORY — DX: Disease of pericardium, unspecified: I31.9

## 2017-05-06 LAB — RESPIRATORY PANEL BY PCR
Adenovirus: NOT DETECTED
Bordetella pertussis: NOT DETECTED
CORONAVIRUS 229E-RVPPCR: NOT DETECTED
CORONAVIRUS HKU1-RVPPCR: NOT DETECTED
CORONAVIRUS OC43-RVPPCR: NOT DETECTED
Chlamydophila pneumoniae: NOT DETECTED
Coronavirus NL63: NOT DETECTED
INFLUENZA B-RVPPCR: NOT DETECTED
Influenza A: NOT DETECTED
MYCOPLASMA PNEUMONIAE-RVPPCR: NOT DETECTED
Metapneumovirus: NOT DETECTED
Parainfluenza Virus 1: NOT DETECTED
Parainfluenza Virus 2: NOT DETECTED
Parainfluenza Virus 3: NOT DETECTED
Parainfluenza Virus 4: NOT DETECTED
RESPIRATORY SYNCYTIAL VIRUS-RVPPCR: NOT DETECTED
Rhinovirus / Enterovirus: NOT DETECTED

## 2017-05-06 LAB — ANA W/REFLEX IF POSITIVE: ANA: NEGATIVE

## 2017-05-06 LAB — GLUCOSE, CAPILLARY
GLUCOSE-CAPILLARY: 238 mg/dL — AB (ref 65–99)
GLUCOSE-CAPILLARY: 133 mg/dL — AB (ref 65–99)
GLUCOSE-CAPILLARY: 126 mg/dL — AB (ref 65–99)
Glucose-Capillary: 134 mg/dL — ABNORMAL HIGH (ref 65–99)
Glucose-Capillary: 124 mg/dL — ABNORMAL HIGH (ref 65–99)
Glucose-Capillary: 70 mg/dL (ref 65–99)

## 2017-05-06 LAB — T3: T3, Total: 71 ng/dL (ref 71–180)

## 2017-05-06 MED ORDER — ASPIRIN EC 81 MG PO TBEC
81.0000 mg | DELAYED_RELEASE_TABLET | Freq: Every day | ORAL | Status: DC
  Administered 2017-05-07 – 2017-05-17 (×11): 81 mg via ORAL
  Filled 2017-05-06 (×11): qty 1

## 2017-05-06 MED ORDER — HYDROCODONE-ACETAMINOPHEN 5-325 MG PO TABS
1.0000 | ORAL_TABLET | Freq: Four times a day (QID) | ORAL | Status: DC | PRN
  Administered 2017-05-06 – 2017-05-13 (×7): 1 via ORAL
  Filled 2017-05-06 (×9): qty 1

## 2017-05-06 MED ORDER — MORPHINE SULFATE (PF) 2 MG/ML IV SOLN
2.0000 mg | Freq: Once | INTRAVENOUS | Status: AC
  Administered 2017-05-06: 2 mg via INTRAVENOUS
  Filled 2017-05-06: qty 1

## 2017-05-06 NOTE — Progress Notes (Signed)
Progress Note  Patient Name: [name] Date of Encounter: 05/06/2017  Primary Cardiologist: No primary care provider on file.   Subjective   Increased chest pressure this am.  Worse when leaning forward than lying flat.  No shortness of breath or orthopnea.  Inpatient Medications    Scheduled Meds: . aspirin  325 mg Oral Q breakfast  . bisoprolol  5 mg Oral Daily  . carbamazepine  200 mg Oral BID  . colchicine  0.6 mg Oral BID  . diltiazem  240 mg Oral QHS  . donepezil  10 mg Oral QHS  . feeding supplement (GLUCERNA SHAKE)  237 mL Oral BID BM  . gabapentin  300 mg Oral BID  . heparin  5,000 Units Subcutaneous Q8H  . ibuprofen  800 mg Oral TID  . insulin aspart  0-5 Units Subcutaneous QHS  . insulin pump   Subcutaneous TID AC, HS, 0200  . levothyroxine  75 mcg Oral QAC breakfast  .  morphine injection  2 mg Intravenous Once  . OLANZapine  5 mg Oral QHS  . pantoprazole  40 mg Oral Daily  . pravastatin  20 mg Oral Daily   Continuous Infusions:  PRN Meds: acetaminophen, HYDROcodone-acetaminophen, nitroGLYCERIN, ondansetron (ZOFRAN) IV   Vital Signs    Vitals:   05/05/17 1356 05/05/17 1448 05/05/17 1934 05/06/17 0323  BP: (!) 83/60 (!) 107/51 (!) 113/53 109/62  Pulse: 71  71 66  Resp: (!) _0 Temp: 99 F (37.2 C)  97.9 F (36.6 C) 97.8 F (36.6 C)  TempSrc: Oral  Oral Oral  SpO2: 95%  95% 93%  Weight:    220 lb 3.2 oz (99.9 kg)  Height:        Intake/Output Summary (Last 24 hours) at 05/06/2017 0859 Last data filed at 05/05/2017 2318 Gross per 24 hour  Intake 720 ml  Output 375 ml  Net 345 ml   Filed Weights   05/04/17 2332 05/05/17 0524 05/06/17 0323  Weight: 219 lb 6.4 oz (99.5 kg) 218 lb 11.2 oz (99.2 kg) 220 lb 3.2 oz (99.9 kg)    Telemetry    Sinus rhythm. PVCs  - Personally Reviewed  ECG    Sinus rhythm.  Rate 84 bpm.  Non-specific T wave abnormalities.  - Personally Reviewed  Physical Exam   VS:  BP 109/62 (BP Location:  Right Arm)   Pulse 66   Temp 97.8 F (36.6 C) (Oral)   Resp 17   Ht _1  (1.6 m)   Wt 220 lb 3.2 oz (99.9 kg)   SpO2 93%   BMI 39.01 kg/m  , BMI Body mass index is 39.01 kg/m. GENERAL:  Mildly ill-appearing.  Mild dyspnea HEENT: Pupils equal round and reactive, fundi not visualized, oral mucosa unremarkable NECK:  No jugular venous distention, waveform within normal limits, carotid upstroke brisk and symmetric, no bruits LUNGS:  Clear to auscultation bilaterally. No crackles, wheezes or rhonchi. HEART:  RRR.  PMI not displaced or sustained,S1 and S2 within normal limits, no S3, no S4, no clicks, no rubs, no murmurs ABD:  Flat, positive bowel sounds normal in frequency in pitch, no bruits, no rebound, no guarding, no midline pulsatile mass, no hepatomegaly, no splenomegaly EXT:  2 plus pulses throughout, no edema, no cyanosis no clubbing SKIN:  No rashes no nodules NEURO:  Cranial nerves II through XII grossly intact, motor grossly intact throughout PSYCH:  Cognitively intact, oriented to person place and time  Labs    Chemistry Recent Labs  Lab 05/03/17 0830 05/04/17 1615 05/04/17 1707 05/05/17 0352  NA 136 134*  --  135  K 5.1 4.7  --  4.6  CL 98 100*  --  102  CO2 22 22  --  24  GLUCOSE 170* 135*  --  113*  BUN 11 8  --  10  CREATININE 0.86 0.85 0.82 0.88  CALCIUM 9.1 9.0  --  8.7*  GFRNONAA 66 >60 >60 >60  GFRAA 76 >60 >60 >60  ANIONGAP  --  12  --  9     Hematology Recent Labs  Lab 05/04/17 1615 05/04/17 1707 05/05/17 0352  WBC 11.2* 10.5 12.6*  RBC 4.72 4.73 4.61  HGB 11.8* 11.9* 11.3*  HCT 37.3 37.9 36.6  MCV 79.0 80.1 79.4  MCH 25.0* 25.2* 24.5*  MCHC 31.6 31.4 30.9  RDW 15.9* 16.2* 16.2*  PLT 625* 672* 599*    Cardiac EnzymesNo results for input(s): TROPONINI in the last 168 hours.  Recent Labs  Lab 05/04/17 1620  TROPIPOC 0.03     BNPNo results for input(s): BNP, PROBNP in the last 168 hours.   DDimer No results for input(s): DDIMER in  the last 168 hours.   Radiology    Dg Chest 2 View  Result Date: 05/04/2017 CLINICAL DATA:  BILATERAL pleural effusions. EXAM: CHEST - 2 VIEW COMPARISON:  CT chest 04/24/2017. FINDINGS: Increased cardiac silhouette reflects pericardial effusion. Large LEFT pleural effusion with compressive atelectasis. Small RIGHT effusion best visualized on the lateral view. No definite consolidation. No pneumothorax. No osseous findings. IMPRESSION: Pericardial effusion and BILATERAL pleural effusions as described, reflect the findings noted on recent CT. Electronically Signed   By: Staci Righter M.D.   On: 05/04/2017 16:57   Ct Angio Chest Pe W Or Wo Contrast  Result Date: 05/04/2017 CLINICAL DATA:  Severe shortness of breath beginning 04/21/2017. EXAM: CT ANGIOGRAPHY CHEST WITH CONTRAST TECHNIQUE: Multidetector CT imaging of the chest was performed using the standard protocol during bolus administration of intravenous contrast. Multiplanar CT image reconstructions and MIPs were obtained to evaluate the vascular anatomy. CONTRAST:  20m ISOVUE-370 IOPAMIDOL (ISOVUE-370) INJECTION 76% COMPARISON:  Radiography 03/26/2017. FINDINGS: Cardiovascular: Pulmonary arterial opacification is excellent. There are no pulmonary emboli. There is aortic atherosclerosis but no aneurysm. No coronary artery calcification is seen. Heart size is normal. There is a large pericardial effusion. Mediastinum/Nodes: No mass or lymphadenopathy. Lungs/Pleura: Bilateral pleural effusions, moderate size on the left and small on the right, layering dependently with dependent pulmonary atelectasis. Upper Abdomen: Negative.  Previous cholecystectomy. Musculoskeletal: Minimal spinal degenerative changes. No significant bone finding. Review of the MIP images confirms the above findings. IMPRESSION: No pulmonary emboli. Large pericardial effusion. Moderate sized left pleural effusion and small right pleural effusion layering dependently with associated  dependent pulmonary atelectasis. Aortic atherosclerosis. Electronically Signed   By: MNelson ChimesM.D.   On: 05/04/2017 15:12   Mr Cardiac Morphology W Wo Contrast  Result Date: 05/05/2017 CLINICAL DATA:  77year old female with pericardial effusion of unknown etiology. EXAM: CARDIAC MRI TECHNIQUE: The patient was scanned on a 1.5 Tesla GE magnet. A dedicated cardiac coil was used. Functional imaging was done using Fiesta sequences. 2,3, and 4 chamber views were done to assess for RWMA's. Modified Simpson's rule using a short axis stack was used to calculate an ejection fraction on a dedicated work sConservation officer, nature The patient received 27 cc of Multihance. After 10 minutes inversion recovery sequences  were used to assess for infiltration and scar tissue. CONTRAST:  27 cc  of Multihance FINDINGS: 1. Normal left ventricular size, with mild concentric hypertrophy and normal systolic function (LVEF =35). There are no regional wall motion abnormalities. There is no late gadolinium enhancement in the left ventricular myocardium. LVEDD: 49 mm LVESD: 25 mm LVEDV: 87 ml LVESV: 29 ml SV: 58 ml CO: 4.2 L/min Myocardial mass: 110 g 2. Normal right ventricular size, thickness and systolic function (LVEF =57%). There are no regional wall motion abnormalities. 3.  Normal left and right atrial size. 4. Normal size of the aortic root, ascending aorta and pulmonary artery. 5.  No significant valvular abnormalities. 6. There is a small amount of pericardial effusion located predominantly lateral to the left ventricle with maximum thickness 9 mm. There is no chamber collapse, IVC is not dilated. Pericardium is severely thickened measuring up to 6 mm with significant diffuse late gadolinium enhancement. 7.  There is bilateral pleural effusion more prominent on the left. IMPRESSION: 1. Normal left ventricular size, with mild concentric hypertrophy and normal systolic function (LVEF =32). There are no regional wall  motion abnormalities. There is no late gadolinium enhancement in the left ventricular myocardium. 2. Normal right ventricular size, thickness and systolic function (LVEF =20%). There are no regional wall motion abnormalities. 3.  Normal left and right atrial size. 4. Normal size of the aortic root, ascending aorta and pulmonary artery. 5.  No significant valvular abnormalities. 6. There is a small amount of pericardial effusion located predominantly lateral to the left ventricle with maximum thickness 9 mm. There is no chamber collapse, IVC is not dilated. Pericardium is severely thickened measuring up to 6 mm with significant diffuse late gadolinium enhancement consistent with severe circumferential acute pericarditis. There is no evidence of myocarditis. Significant epicardial fat adjacent to the right ventricle. 7.  There is bilateral pleural effusion more prominent on the left. Electronically Signed   By: Ena Dawley   On: 05/05/2017 22:24    Cardiac Studies   Echo 05/05/17: Study Conclusions  - Left ventricle: The cavity size was normal. Systolic function was   normal. The estimated ejection fraction was in the range of 60%   to 65%. Wall motion was normal; there were no regional wall   motion abnormalities. Doppler parameters are consistent with   abnormal left ventricular relaxation (grade 1 diastolic   dysfunction). - Pericardium, extracardiac: There was moderate thickening. A   moderate pericardial effusion was identified circumferential to   the heart. There was no evidence of hemodynamic compromise. There   was no chamber collapse. There was evidence for mildly increased   RV-LV interaction demonstrated by respirophasic changes in   transmitral velocities. Features were not consistent with   tamponade physiology.  Impressions:  - Findings suggest possible acute pericarditis with marked   pericardial thickening. There is no evidence of tamponade, but   there is subtle  evidence of enhanced ventricular interdependence.   Consider a component of constrictive physiology. Cardiac MRI may   offer additional diagnostic information.  Patient Profile     77 y.o. female with diabetes, prior TIA, hypertension, hypothyroidism, and morbid obesity here with shortness of breath and a large pericardial effusion without tamponade.   Assessment & Plan    # Pericardial effusion:  Echo showed a moderate circumferential pericardial effusion without tamponade.  There was also pericardial thickening.  She had a cardiac MRI 4/18 that showed an effusion mostly on the lateral LV wall  and increased pericardial thickening up to 38m.  There was no myocardial delayed enhancement.  Findings consistent with pericarditis.  Thyroid function was normal.  ESR and CRP are both elevated consistent with pericarditis.  She was started on colchicine and ibuprofen.  This morning her pain is worse.  The pain is pleuritic and worse when sitting forward and lying back.  We will give her some morphine and add hydrocodone as needed.  Plan for 3 months of colchicine in 1 month of ibuprofen.  She is Artie on pantoprazole.    ANA is still pending.  We will add a respiratory viral panel.  The size of the effusion seems to be improving as it was large on CT, moderate on echo, and mild on cardiac MRI yesterday.  There is no evidence of tamponade.  # Hypertension: BP controlled.  Continue bisoporol and diltiazem.   # Hyperlipidemia: Continue pravastatin.   # Diabetes: Appreciate diabetes coordinator assistance.  For questions or updates, [place] [email or web address] for contact info under Cardiology/STEMI.      Signed, TSkeet Latch MD  05/06/2017, 8:59 AM

## 2017-05-06 NOTE — Care Management (Signed)
Benefits Check completed for Colchicine. CM will make pt aware of cost. Gala LewandowskyGraves-Bigelow, Makenli Derstine Kaye, RN,BSN [phone]    S/W GABRIEL @ [employer] RX # [id number]    COLCHICINE TABLET 0.6 MG BID  COVER- YES  CO-PAY- $ 40.00  Q/L 120 PER 30 DAYS  TIER- 2 DRUG  PRIOR APPROVAL- NO   PREFERRED PHARMACY : YES-[facility]  MAIL-ORDER FOR 90 DAY SUPPLY $ 80.00

## 2017-05-07 LAB — GLUCOSE, CAPILLARY
GLUCOSE-CAPILLARY: 99 mg/dL (ref 65–99)
GLUCOSE-CAPILLARY: 166 mg/dL — AB (ref 65–99)
Glucose-Capillary: 101 mg/dL — ABNORMAL HIGH (ref 65–99)
Glucose-Capillary: 209 mg/dL — ABNORMAL HIGH (ref 65–99)
Glucose-Capillary: 136 mg/dL — ABNORMAL HIGH (ref 65–99)
Glucose-Capillary: 167 mg/dL — ABNORMAL HIGH (ref 65–99)

## 2017-05-07 NOTE — Progress Notes (Signed)
Progress Note  Patient Name: [name] Date of Encounter: 05/07/2017  Primary Cardiologist: Dr. Dani Gobble Croitoru  Subjective   Still complains of pleuritic chest discomfort, shortness of breath.  Feels generally weak and worse than at presentation.  Inpatient Medications    Scheduled Meds: . aspirin EC  81 mg Oral Daily  . bisoprolol  5 mg Oral Daily  . carbamazepine  200 mg Oral BID  . colchicine  0.6 mg Oral BID  . diltiazem  240 mg Oral QHS  . donepezil  10 mg Oral QHS  . feeding supplement (GLUCERNA SHAKE)  237 mL Oral BID BM  . gabapentin  300 mg Oral BID  . heparin  5,000 Units Subcutaneous Q8H  . ibuprofen  800 mg Oral TID  . insulin aspart  0-5 Units Subcutaneous QHS  . insulin pump   Subcutaneous TID AC, HS, 0200  . levothyroxine  75 mcg Oral QAC breakfast  . OLANZapine  5 mg Oral QHS  . pantoprazole  40 mg Oral Daily  . pravastatin  20 mg Oral Daily    PRN Meds: acetaminophen, HYDROcodone-acetaminophen, nitroGLYCERIN, ondansetron (ZOFRAN) IV   Vital Signs    Vitals:   05/06/17 1426 05/06/17 2101 05/07/17 0214 05/07/17 0556  BP: (!) 121/59 (!) 111/51  116/61  Pulse: 78 75  79  Resp: 18     Temp: 97.7 F (36.5 C) 97.8 F (36.6 C)  (!) 97.4 F (36.3 C)  TempSrc: Oral Oral  Oral  SpO2: 96% 95%  92%  Weight:   219 lb 3.2 oz (99.4 kg)   Height:        Intake/Output Summary (Last 24 hours) at 05/07/2017 1009 Last data filed at 05/07/2017 0225 Gross per 24 hour  Intake 820 ml  Output 400 ml  Net 420 ml   Filed Weights   05/05/17 0524 05/06/17 0323 05/07/17 0214  Weight: 218 lb 11.2 oz (99.2 kg) 220 lb 3.2 oz (99.9 kg) 219 lb 3.2 oz (99.4 kg)    Telemetry    Sinus rhythm.  Personally reviewed.  ECG    Tracing from 05/04/2017 showed sinus rhythm with nonspecific ST-T changes.  Personally reviewed.  Physical Exam   GEN:  Obese woman.  No acute distress.   Neck: No JVD. Cardiac: RRR, no rub or gallop.  Respiratory: Nonlabored.   Decreased at the bases. GI: Soft, nontender, bowel sounds present. MS: No edema; No deformity. Neuro:  Nonfocal. Psych: Alert and oriented x 3. Normal affect.  Labs    Chemistry Recent Labs  Lab 05/03/17 0830 05/04/17 1615 05/04/17 1707 05/05/17 0352  NA 136 134*  --  135  K 5.1 4.7  --  4.6  CL 98 100*  --  102  CO2 22 22  --  24  GLUCOSE 170* 135*  --  113*  BUN 11 8  --  10  CREATININE 0.86 0.85 0.82 0.88  CALCIUM 9.1 9.0  --  8.7*  GFRNONAA 66 >60 >60 >60  GFRAA 76 >60 >60 >60  ANIONGAP  --  12  --  9     Hematology Recent Labs  Lab 05/04/17 1615 05/04/17 1707 05/05/17 0352  WBC 11.2* 10.5 12.6*  RBC 4.72 4.73 4.61  HGB 11.8* 11.9* 11.3*  HCT 37.3 37.9 36.6  MCV 79.0 80.1 79.4  MCH 25.0* 25.2* 24.5*  MCHC 31.6 31.4 30.9  RDW 15.9* 16.2* 16.2*  PLT 625* 672* 599*    Cardiac EnzymesNo results for input(s):  TROPONINI in the last 168 hours.  Recent Labs  Lab 05/04/17 1620  TROPIPOC 0.03     Radiology    Mr Cardiac Morphology W Wo Contrast  Result Date: 05/05/2017 CLINICAL DATA:  77 year old female with pericardial effusion of unknown etiology. EXAM: CARDIAC MRI TECHNIQUE: The patient was scanned on a 1.5 Tesla GE magnet. A dedicated cardiac coil was used. Functional imaging was done using Fiesta sequences. 2,3, and 4 chamber views were done to assess for RWMA's. Modified Simpson's rule using a short axis stack was used to calculate an ejection fraction on a dedicated work Conservation officer, nature. The patient received 27 cc of Multihance. After 10 minutes inversion recovery sequences were used to assess for infiltration and scar tissue. CONTRAST:  27 cc  of Multihance FINDINGS: 1. Normal left ventricular size, with mild concentric hypertrophy and normal systolic function (LVEF =93). There are no regional wall motion abnormalities. There is no late gadolinium enhancement in the left ventricular myocardium. LVEDD: 49 mm LVESD: 25 mm LVEDV: 87 ml LVESV: 29 ml  SV: 58 ml CO: 4.2 L/min Myocardial mass: 110 g 2. Normal right ventricular size, thickness and systolic function (LVEF =57%). There are no regional wall motion abnormalities. 3.  Normal left and right atrial size. 4. Normal size of the aortic root, ascending aorta and pulmonary artery. 5.  No significant valvular abnormalities. 6. There is a small amount of pericardial effusion located predominantly lateral to the left ventricle with maximum thickness 9 mm. There is no chamber collapse, IVC is not dilated. Pericardium is severely thickened measuring up to 6 mm with significant diffuse late gadolinium enhancement. 7.  There is bilateral pleural effusion more prominent on the left. IMPRESSION: 1. Normal left ventricular size, with mild concentric hypertrophy and normal systolic function (LVEF =01). There are no regional wall motion abnormalities. There is no late gadolinium enhancement in the left ventricular myocardium. 2. Normal right ventricular size, thickness and systolic function (LVEF =77%). There are no regional wall motion abnormalities. 3.  Normal left and right atrial size. 4. Normal size of the aortic root, ascending aorta and pulmonary artery. 5.  No significant valvular abnormalities. 6. There is a small amount of pericardial effusion located predominantly lateral to the left ventricle with maximum thickness 9 mm. There is no chamber collapse, IVC is not dilated. Pericardium is severely thickened measuring up to 6 mm with significant diffuse late gadolinium enhancement consistent with severe circumferential acute pericarditis. There is no evidence of myocarditis. Significant epicardial fat adjacent to the right ventricle. 7.  There is bilateral pleural effusion more prominent on the left. Electronically Signed   By: Ena Dawley   On: 05/05/2017 22:24    Cardiac Studies   Echocardiogram 05/04/2017: Study Conclusions  - Left ventricle: The cavity size was normal. Systolic function was    normal. The estimated ejection fraction was in the range of 60%   to 65%. Wall motion was normal; there were no regional wall   motion abnormalities. Doppler parameters are consistent with   abnormal left ventricular relaxation (grade 1 diastolic   dysfunction). - Pericardium, extracardiac: There was moderate thickening. A   moderate pericardial effusion was identified circumferential to   the heart. There was no evidence of hemodynamic compromise. There   was no chamber collapse. There was evidence for mildly increased   RV-LV interaction demonstrated by respirophasic changes in   transmitral velocities. Features were not consistent with   tamponade physiology.  Impressions:  - Findings  suggest possible acute pericarditis with marked   pericardial thickening. There is no evidence of tamponade, but   there is subtle evidence of enhanced ventricular interdependence.   Consider a component of constrictive physiology. Cardiac MRI may   offer additional diagnostic information.  Patient Profile     77 y.o. female with a history of type 2 diabetes mellitus, previous TIA, hypertension, hypothyroidism, and obesity, now presenting with acute pericarditis associated with effusion and significant pericardial thickening.  Evaluation has included echocardiogram and cardiac MRI as noted above.  Assessment & Plan    1.  Acute pericarditis with pericardial effusion.  Viral respiratory screen negative and ANA negative.  CRP 17 and ESR 64.  Thyroid studies normal.  Troponin I negative and cardiac MRI shows no clear evidence of myocarditis.  Pericardial effusion described as small based on most recent imaging study and has decreased overall, however she does have severe pericardial thickening (up to 6 mm by cardiac MRI).  Currently on colchicine and ibuprofen, but remains symptomatic.  He is afebrile.  2.  Essential hypertension.  Blood pressure stable on bisoprolol and Cardizem CD.  3.  Mixed  hyperlipidemia, on Pravachol.  Not ready for discharge at this point.  She is on reasonable doses of colchicine and ibuprofen.  Also has other analgesics available.  Holding off on glucocorticoids in light of possible association with recurrent pericarditis.  Continue to monitor.  Will eventually repeat ESR, also possible limited echocardiogram if symptoms do not start to improve.  Signed, Rozann Lesches, MD  05/07/2017, 10:09 AM

## 2017-05-07 NOTE — Plan of Care (Signed)
Patient denies pain at current time and states she has felt better since this afternoon.

## 2017-05-07 NOTE — Plan of Care (Signed)
  Problem: Coping: Goal: Level of anxiety will decrease Outcome: Completed/Met  Pt denies anxiety. Displays no signs or symptoms of distress

## 2017-05-08 ENCOUNTER — Inpatient Hospital Stay (HOSPITAL_COMMUNITY): Payer: Medicare Other

## 2017-05-08 LAB — CBC
HEMATOCRIT: 35.4 % — AB (ref 36.0–46.0)
HEMOGLOBIN: 11.3 g/dL — AB (ref 12.0–15.0)
MCH: 25 pg — AB (ref 26.0–34.0)
MCHC: 31.9 g/dL (ref 30.0–36.0)
MCV: 78.3 fL (ref 78.0–100.0)
Platelets: 562 10*3/uL — ABNORMAL HIGH (ref 150–400)
RBC: 4.52 MIL/uL (ref 3.87–5.11)
RDW: 16.8 % — AB (ref 11.5–15.5)
WBC: 14.2 10*3/uL — ABNORMAL HIGH (ref 4.0–10.5)

## 2017-05-08 LAB — BASIC METABOLIC PANEL
Anion gap: 12 (ref 5–15)
BUN: 18 mg/dL (ref 6–20)
CALCIUM: 8.6 mg/dL — AB (ref 8.9–10.3)
CHLORIDE: 102 mmol/L (ref 101–111)
CO2: 17 mmol/L — AB (ref 22–32)
CREATININE: 1.12 mg/dL — AB (ref 0.44–1.00)
GFR calc Af Amer: 54 mL/min — ABNORMAL LOW (ref >60)
GFR calc non Af Amer: 46 mL/min — ABNORMAL LOW (ref >60)
Glucose, Bld: 250 mg/dL — ABNORMAL HIGH (ref 65–99)
Potassium: 5 mmol/L (ref 3.5–5.1)
SODIUM: 131 mmol/L — AB (ref 135–145)

## 2017-05-08 LAB — URINALYSIS, ROUTINE W REFLEX MICROSCOPIC
BILIRUBIN URINE: NEGATIVE
GLUCOSE, UA: NEGATIVE mg/dL
HGB URINE DIPSTICK: NEGATIVE
KETONES UR: NEGATIVE mg/dL
NITRITE: NEGATIVE
PH: 5 (ref 5.0–8.0)
Protein, ur: 30 mg/dL — AB
SPECIFIC GRAVITY, URINE: 1.018 (ref 1.005–1.030)

## 2017-05-08 LAB — GLUCOSE, CAPILLARY
GLUCOSE-CAPILLARY: 217 mg/dL — AB (ref 65–99)
Glucose-Capillary: 108 mg/dL — ABNORMAL HIGH (ref 65–99)
Glucose-Capillary: 139 mg/dL — ABNORMAL HIGH (ref 65–99)
Glucose-Capillary: 172 mg/dL — ABNORMAL HIGH (ref 65–99)
Glucose-Capillary: 115 mg/dL — ABNORMAL HIGH (ref 65–99)
Glucose-Capillary: 65 mg/dL (ref 65–99)

## 2017-05-08 LAB — CBC WITH DIFFERENTIAL/PLATELET
BASOS ABS: 0 10*3/uL (ref 0.0–0.1)
Basophils Relative: 0 %
Eosinophils Absolute: 0.1 10*3/uL (ref 0.0–0.7)
Eosinophils Relative: 1 %
HEMATOCRIT: 35.1 % — AB (ref 36.0–46.0)
HEMOGLOBIN: 10.7 g/dL — AB (ref 12.0–15.0)
Lymphocytes Relative: 12 %
Lymphs Abs: 1.5 10*3/uL (ref 0.7–4.0)
MCH: 24.4 pg — ABNORMAL LOW (ref 26.0–34.0)
MCHC: 30.5 g/dL (ref 30.0–36.0)
MCV: 80.1 fL (ref 78.0–100.0)
Monocytes Absolute: 1.5 10*3/uL — ABNORMAL HIGH (ref 0.1–1.0)
Monocytes Relative: 12 %
NEUTROS ABS: 9.5 10*3/uL — AB (ref 1.7–7.7)
NEUTROS PCT: 75 %
PLATELETS: 516 10*3/uL — AB (ref 150–400)
RBC: 4.38 MIL/uL (ref 3.87–5.11)
RDW: 16.5 % — ABNORMAL HIGH (ref 11.5–15.5)
WBC: 12.6 10*3/uL — AB (ref 4.0–10.5)

## 2017-05-08 MED ORDER — SODIUM CHLORIDE 0.9 % IV BOLUS
500.0000 mL | Freq: Once | INTRAVENOUS | Status: AC
  Administered 2017-05-08: 500 mL via INTRAVENOUS

## 2017-05-08 NOTE — Progress Notes (Signed)
Progress Note  Patient Name: [name] Date of Encounter: 05/08/2017  Primary Cardiologist: Dr. Dani Gobble Croitoru  Subjective   States that she had a good afternoon, no chest pain and ate a good dinner, but this morning has had recurring pleuritic chest discomfort and feels weak.  Inpatient Medications    Scheduled Meds: . aspirin EC  81 mg Oral Daily  . bisoprolol  5 mg Oral Daily  . carbamazepine  200 mg Oral BID  . colchicine  0.6 mg Oral BID  . diltiazem  240 mg Oral QHS  . donepezil  10 mg Oral QHS  . feeding supplement (GLUCERNA SHAKE)  237 mL Oral BID BM  . gabapentin  300 mg Oral BID  . heparin  5,000 Units Subcutaneous Q8H  . ibuprofen  800 mg Oral TID  . insulin aspart  0-5 Units Subcutaneous QHS  . insulin pump   Subcutaneous TID AC, HS, 0200  . levothyroxine  75 mcg Oral QAC breakfast  . OLANZapine  5 mg Oral QHS  . pantoprazole  40 mg Oral Daily  . pravastatin  20 mg Oral Daily    PRN Meds: acetaminophen, HYDROcodone-acetaminophen, nitroGLYCERIN, ondansetron (ZOFRAN) IV   Vital Signs    Vitals:   05/07/17 1508 05/07/17 2124 05/08/17 0603 05/08/17 0818  BP: (!) 110/58 (!) 114/56 (!) 145/62 125/89  Pulse: 73 76 93 (!) 108  Resp: 16     Temp: 98 F (36.7 C) 97.6 F (36.4 C) 97.9 F (36.6 C)   TempSrc: Oral Oral Oral   SpO2: 92% 97% 95% 98%  Weight:   219 lb (99.3 kg)   Height:        Intake/Output Summary (Last 24 hours) at 05/08/2017 0848 Last data filed at 05/08/2017 0842 Gross per 24 hour  Intake 237 ml  Output 850 ml  Net -613 ml   Filed Weights   05/06/17 0323 05/07/17 0214 05/08/17 0603  Weight: 220 lb 3.2 oz (99.9 kg) 219 lb 3.2 oz (99.4 kg) 219 lb (99.3 kg)    Telemetry    Sinus rhythm and sinus tachycardia.  Personally reviewed.  ECG    Tracing from 05/08/2017 shows sinus rhythm with nonspecific T wave changes.  Personally reviewed.  Physical Exam   GEN:  Obese woman.  No acute distress.   Neck: No JVD. Cardiac: RRR,  no gallop.  Respiratory: Nonlabored. Clear to auscultation bilaterally. GI: Soft, nontender, bowel sounds present. MS: No edema; No deformity. Neuro:  Nonfocal. Psych: Alert and oriented x 3. Normal affect.  Labs    Chemistry Recent Labs  Lab 05/03/17 0830 05/04/17 1615 05/04/17 1707 05/05/17 0352  NA 136 134*  --  135  K 5.1 4.7  --  4.6  CL 98 100*  --  102  CO2 22 22  --  24  GLUCOSE 170* 135*  --  113*  BUN 11 8  --  10  CREATININE 0.86 0.85 0.82 0.88  CALCIUM 9.1 9.0  --  8.7*  GFRNONAA 66 >60 >60 >60  GFRAA 76 >60 >60 >60  ANIONGAP  --  12  --  9     Hematology Recent Labs  Lab 05/04/17 1615 05/04/17 1707 05/05/17 0352  WBC 11.2* 10.5 12.6*  RBC 4.72 4.73 4.61  HGB 11.8* 11.9* 11.3*  HCT 37.3 37.9 36.6  MCV 79.0 80.1 79.4  MCH 25.0* 25.2* 24.5*  MCHC 31.6 31.4 30.9  RDW 15.9* 16.2* 16.2*  PLT 625* 672* 599*  Cardiac EnzymesNo results for input(s): TROPONINI in the last 168 hours.  Recent Labs  Lab 05/04/17 1620  TROPIPOC 0.03     Radiology    No results found.  Cardiac Studies   Echocardiogram 05/04/2017: Study Conclusions  - Left ventricle: The cavity size was normal. Systolic function was normal. The estimated ejection fraction was in the range of 60% to 65%. Wall motion was normal; there were no regional wall motion abnormalities. Doppler parameters are consistent with abnormal left ventricular relaxation (grade 1 diastolic dysfunction). - Pericardium, extracardiac: There was moderate thickening. A moderate pericardial effusion was identified circumferential to the heart. There was no evidence of hemodynamic compromise. There was no chamber collapse. There was evidence for mildly increased RV-LV interaction demonstrated by respirophasic changes in transmitral velocities. Features were not consistent with tamponade physiology.  Impressions:  - Findings suggest possible acute pericarditis with  marked pericardial thickening. There is no evidence of tamponade, but there is subtle evidence of enhanced ventricular interdependence. Consider a component of constrictive physiology. Cardiac MRI may offer additional diagnostic information.  Patient Profile     77 y.o. female with a history of type 2 diabetes mellitus, previous TIA, hypertension, hypothyroidism, and obesity, now presenting with acute pericarditis associated with effusion and significant pericardial thickening.  Evaluation has included echocardiogram and cardiac MRI as noted above.  Assessment & Plan    1.  Acute pericarditis with pericardial effusion. Viral respiratory screen negative and ANA negative.  CRP 17 and ESR 64.  Thyroid studies normal.  Troponin I negative and cardiac MRI shows no clear evidence of myocarditis. She does have severe pericardial thickening (up to 6 mm by cardiac MRI).  She is on combination of colchicine and ibuprofen.  Afebrile but rate remains symptomatic.  2.  Essential hypertension.  Continues on bisoprolol and Cardizem CD.  3.  Hyperlipidemia, on Pravachol.  Reassuring that she did have an improvement in symptoms and appetite yesterday afternoon, but feels worse again this morning.  She remains afebrile.  Follow-up CBC and sed rate.  We will get a repeat limited echocardiogram for tomorrow mainly to ensure stability in pericardial effusion.  Signed, Rozann Lesches, MD  05/08/2017, 8:48 AM

## 2017-05-08 NOTE — Progress Notes (Signed)
Called to bedside by nursing. Patient c/o same chest pain that brought her to the hospital - pain has not changed. She is also now having upper and lower abdominal discomfort. On exam, she is mildly tender to palpation. She is passing gas and having formed BMs. Discussed with Dr. Diona BrownerMcDowell. Will order portable CXR, KUB, UA, and CBC for now.

## 2017-05-08 NOTE — Progress Notes (Signed)
Paged Angie Duke PA that patient's BP 87/57. Pt is diaphoretic and c/o abdominal pain, chest pain, and lightheadedness. Received orders for 500 cc fluid bolus. PRN zofran also given to patient. Will continue to monitor.  Update: After fluid bolus completion; patient still complaining of abdominal pain/chest pain. Patient stating she "feels bad" and is still mildly diaphoretic/pale. BP 97/53. States chest pain has not changed and is the same type that brought her to hospital. Bettina GaviaAngie Duke PA came to bedside to assess patient.

## 2017-05-09 ENCOUNTER — Inpatient Hospital Stay (HOSPITAL_COMMUNITY): Payer: Medicare Other

## 2017-05-09 DIAGNOSIS — Z794 Long term (current) use of insulin: Secondary | ICD-10-CM

## 2017-05-09 DIAGNOSIS — I313 Pericardial effusion (noninflammatory): Secondary | ICD-10-CM

## 2017-05-09 DIAGNOSIS — E1100 Type 2 diabetes mellitus with hyperosmolarity without nonketotic hyperglycemic-hyperosmolar coma (NKHHC): Secondary | ICD-10-CM

## 2017-05-09 DIAGNOSIS — I309 Acute pericarditis, unspecified: Secondary | ICD-10-CM

## 2017-05-09 LAB — BASIC METABOLIC PANEL
Anion gap: 8 (ref 5–15)
BUN: 24 mg/dL — ABNORMAL HIGH (ref 6–20)
CO2: 21 mmol/L — ABNORMAL LOW (ref 22–32)
Calcium: 8.3 mg/dL — ABNORMAL LOW (ref 8.9–10.3)
Chloride: 103 mmol/L (ref 101–111)
Creatinine, Ser: 1.05 mg/dL — ABNORMAL HIGH (ref 0.44–1.00)
GFR calc Af Amer: 58 mL/min — ABNORMAL LOW (ref >60)
GFR, EST NON AFRICAN AMERICAN: 50 mL/min — AB (ref >60)
Glucose, Bld: 167 mg/dL — ABNORMAL HIGH (ref 65–99)
POTASSIUM: 4.2 mmol/L (ref 3.5–5.1)
Sodium: 132 mmol/L — ABNORMAL LOW (ref 135–145)

## 2017-05-09 LAB — URINALYSIS, ROUTINE W REFLEX MICROSCOPIC
Bilirubin Urine: NEGATIVE
GLUCOSE, UA: NEGATIVE mg/dL
HGB URINE DIPSTICK: NEGATIVE
KETONES UR: NEGATIVE mg/dL
NITRITE: NEGATIVE
PROTEIN: 30 mg/dL — AB
Specific Gravity, Urine: 1.016 (ref 1.005–1.030)
pH: 5 (ref 5.0–8.0)

## 2017-05-09 LAB — CBC
HCT: 34.3 % — ABNORMAL LOW (ref 36.0–46.0)
HEMOGLOBIN: 10.6 g/dL — AB (ref 12.0–15.0)
MCH: 24.5 pg — AB (ref 26.0–34.0)
MCHC: 30.9 g/dL (ref 30.0–36.0)
MCV: 79.4 fL (ref 78.0–100.0)
PLATELETS: 538 10*3/uL — AB (ref 150–400)
RBC: 4.32 MIL/uL (ref 3.87–5.11)
RDW: 16.7 % — ABNORMAL HIGH (ref 11.5–15.5)
WBC: 8.6 10*3/uL (ref 4.0–10.5)

## 2017-05-09 LAB — GLUCOSE, CAPILLARY
Glucose-Capillary: 175 mg/dL — ABNORMAL HIGH (ref 65–99)
Glucose-Capillary: 143 mg/dL — ABNORMAL HIGH (ref 65–99)
Glucose-Capillary: 134 mg/dL — ABNORMAL HIGH (ref 65–99)
Glucose-Capillary: 152 mg/dL — ABNORMAL HIGH (ref 65–99)
Glucose-Capillary: 117 mg/dL — ABNORMAL HIGH (ref 65–99)

## 2017-05-09 LAB — ECHOCARDIOGRAM LIMITED
HEIGHTINCHES: 63 in
WEIGHTICAEL: 3534.4 [oz_av]

## 2017-05-09 LAB — SEDIMENTATION RATE: SED RATE: 82 mm/h — AB (ref 0–22)

## 2017-05-09 MED ORDER — NITROFURANTOIN MONOHYD MACRO 100 MG PO CAPS
100.0000 mg | ORAL_CAPSULE | Freq: Two times a day (BID) | ORAL | Status: AC
  Administered 2017-05-09 – 2017-05-14 (×10): 100 mg via ORAL
  Filled 2017-05-09 (×11): qty 1

## 2017-05-09 NOTE — Progress Notes (Signed)
    Paged by RN with UA results. Will start Macrobid 100mg  PO x 5 days, 1st dose 05/09/17 at 2200. Pt has allergy to Cipro. Spoke with pharmacy. Ok to given SunGardMacrobid.   Georgie ChardJill McDaniel NP-C [facility] Pager: [phone]

## 2017-05-09 NOTE — Progress Notes (Signed)
  Echocardiogram 2D Echocardiogram has been performed.  [name], [name] F 05/09/2017, 10:46 AM

## 2017-05-09 NOTE — Progress Notes (Signed)
2D echo showed stable moderate pericardial effusion with no tamponade

## 2017-05-09 NOTE — Plan of Care (Signed)
  Problem: Nutrition: Goal: Adequate nutrition will be maintained Outcome: Progressing   

## 2017-05-09 NOTE — Progress Notes (Signed)
Progress Note  Patient Name: [name] Date of Encounter: 05/09/2017  Primary Cardiologist: No primary care provider on file.   Subjective   Her abdominal pain has resolved after having a BM.  Pleuritic CP much improved this am.  She has not been out of bed since admission  Inpatient Medications    Scheduled Meds: . aspirin EC  81 mg Oral Daily  . carbamazepine  200 mg Oral BID  . colchicine  0.6 mg Oral BID  . diltiazem  240 mg Oral QHS  . donepezil  10 mg Oral QHS  . feeding supplement (GLUCERNA SHAKE)  237 mL Oral BID BM  . gabapentin  300 mg Oral BID  . heparin  5,000 Units Subcutaneous Q8H  . ibuprofen  800 mg Oral TID  . insulin aspart  0-5 Units Subcutaneous QHS  . insulin pump   Subcutaneous TID AC, HS, 0200  . levothyroxine  75 mcg Oral QAC breakfast  . OLANZapine  5 mg Oral QHS  . pantoprazole  40 mg Oral Daily  . pravastatin  20 mg Oral Daily   Continuous Infusions:  PRN Meds: acetaminophen, HYDROcodone-acetaminophen, nitroGLYCERIN, ondansetron (ZOFRAN) IV   Vital Signs    Vitals:   05/08/17 1409 05/08/17 1615 05/08/17 2048 05/09/17 0658  BP: (!) 87/57 (!) 97/53 (!) 104/53 (!) 111/55  Pulse: 70 73 72 71  Resp: 18 18 (!) 21 20  Temp: 97.9 F (36.6 C)  97.6 F (36.4 C) (!) 97.4 F (36.3 C)  TempSrc: Axillary  Oral Oral  SpO2: 95% 97% 97% 97%  Weight:    220 lb 14.4 oz (100.2 kg)  Height:        Intake/Output Summary (Last 24 hours) at 05/09/2017 0745 Last data filed at 05/09/2017 0616 Gross per 24 hour  Intake 1220 ml  Output 250 ml  Net 970 ml   Filed Weights   05/07/17 0214 05/08/17 0603 05/09/17 0658  Weight: 219 lb 3.2 oz (99.4 kg) 219 lb (99.3 kg) 220 lb 14.4 oz (100.2 kg)    Telemetry    NSR - Personally Reviewed  ECG    No new EKG to review - Personally Reviewed  Physical Exam   GEN: No acute distress.   Neck: No JVD Cardiac: RRR, no murmurs, rubs, or gallops.  Respiratory: Clear to auscultation bilaterally. GI:  Soft, nontender, non-distended  MS: No edema; No deformity. Neuro:  Nonfocal  Psych: Normal affect   Labs    Chemistry Recent Labs  Lab 05/05/17 0352 05/08/17 0958 05/09/17 0505  NA 135 131* 132*  K 4.6 5.0 4.2  CL 102 102 103  CO2 24 17* 21*  GLUCOSE 113* 250* 167*  BUN 10 18 24*  CREATININE 0.88 1.12* 1.05*  CALCIUM 8.7* 8.6* 8.3*  GFRNONAA >60 46* 50*  GFRAA >60 54* 58*  ANIONGAP _0 Hematology Recent Labs  Lab 05/08/17 0958 05/08/17 1647 05/09/17 0505  WBC 14.2* 12.6* 8.6  RBC 4.52 4.38 4.32  HGB 11.3* 10.7* 10.6*  HCT 35.4* 35.1* 34.3*  MCV 78.3 80.1 79.4  MCH 25.0* 24.4* 24.5*  MCHC 31.9 30.5 30.9  RDW 16.8* 16.5* 16.7*  PLT 562* 516* 538*    Cardiac EnzymesNo results for input(s): TROPONINI in the last 168 hours.  Recent Labs  Lab 05/04/17 1620  TROPIPOC 0.03     BNPNo results for input(s): BNP, PROBNP in the last 168 hours.   DDimer No results for input(s): DDIMER  in the last 168 hours.   Radiology    Dg Chest Port 1 View  Result Date: 05/08/2017 CLINICAL DATA:  Chest pain, shortness of breath EXAM: PORTABLE CHEST 1 VIEW COMPARISON:  05/04/2017 FINDINGS: Consolidation in the left lower lobe with layering left effusion. No visible right effusion or confluent opacity on the right. Mild cardiomegaly. IMPRESSION: Layering left effusion with left lower lobe atelectasis or infiltrate. Electronically Signed   By: Rolm Baptise M.D.   On: 05/08/2017 18:08   Dg Abd Portable 1v  Result Date: 05/08/2017 CLINICAL DATA:  Abdominal pain EXAM: PORTABLE ABDOMEN - 1 VIEW COMPARISON:  CT 10/16/2015 FINDINGS: Prior cholecystectomy. Moderate stool burden. Calcified phleboliths in the lower pelvis. No evidence of bowel obstruction or free air. IMPRESSION: Moderate stool burden.  No evidence of obstruction or free air. Electronically Signed   By: Rolm Baptise M.D.   On: 05/08/2017 18:08    Cardiac Studies   Echocardiogram 05/04/2017: Study Conclusions  -  Left ventricle: The cavity size was normal. Systolic function was normal. The estimated ejection fraction was in the range of 60% to 65%. Wall motion was normal; there were no regional wall motion abnormalities. Doppler parameters are consistent with abnormal left ventricular relaxation (grade 1 diastolic dysfunction). - Pericardium, extracardiac: There was moderate thickening. A moderate pericardial effusion was identified circumferential to the heart. There was no evidence of hemodynamic compromise. There was no chamber collapse. There was evidence for mildly increased RV-LV interaction demonstrated by respirophasic changes in transmitral velocities. Features were not consistent with tamponade physiology.  Impressions:  - Findings suggest possible acute pericarditis with marked pericardial thickening. There is no evidence of tamponade, but there is subtle evidence of enhanced ventricular interdependence. Consider a component of constrictive physiology. Cardiac MRI may offer additional diagnostic information.  Cardiac MRI 05/05/2017  IMPRESSION: 1. Normal left ventricular size, with mild concentric hypertrophy and normal systolic function (LVEF =05). There are no regional wall motion abnormalities.  There is no late gadolinium enhancement in the left ventricular myocardium.  2. Normal right ventricular size, thickness and systolic function (LVEF =39%). There are no regional wall motion abnormalities.  3.  Normal left and right atrial size.  4. Normal size of the aortic root, ascending aorta and pulmonary artery.  5.  No significant valvular abnormalities.  6. There is a small amount of pericardial effusion located predominantly lateral to the left ventricle with maximum thickness 9 mm. There is no chamber collapse, IVC is not dilated.  Pericardium is severely thickened measuring up to 6 mm with significant diffuse late gadolinium  enhancement consistent with severe circumferential acute pericarditis. There is no evidence of myocarditis.  Significant epicardial fat adjacent to the right ventricle.  7.  There is bilateral pleural effusion more prominent on the left.   Patient Profile     77 y.o. female with a history of type 2 diabetes mellitus, previous TIA, hypertension, hypothyroidism, and obesity, now presenting with acute pericarditis associated with effusion and significant pericardial thickening. Evaluation has included echocardiogram and cardiac MRI as noted above.  Assessment & Plan    1.  Acute pericarditis with pericardial effusion.  - Viral respiratory screen negative and ANA negative.  - CRP 17 and ESR 64.  - Thyroid studies normal.  - Troponin I negative and cardiac MRI shows no clear evidence of myocarditis. She does have severe pericardial thickening (up to 6 mm by cardiac MRI).   - still with CP but much improved. - No rub on  exam - she will continue on colchicine 0.57mBID and motrin 8055mTID - she is on PPI for prophylaxis due to high dose NSAIDS - will get PT working with patient to get her up out of bed walking - limited echo pending today to make sure that effusion has not increased in size - WBC is stable and actually trending downward  2.  Essential hypertension.   - BP controlled on exam today - she will continue on Cardizem CD [id number]  3.  Hyperlipidemia - continue Pravastatin  4.  DM - continue Insulin pump - BS fairly well controlled - 143 this am    For questions or updates, please contact [facility] [place] consult [email or web address] for contact info under Cardiology/STEMI.      Signed, TraFransico HimD  05/09/2017, 7:45 AM

## 2017-05-10 DIAGNOSIS — R5381 Other malaise: Secondary | ICD-10-CM

## 2017-05-10 DIAGNOSIS — N3000 Acute cystitis without hematuria: Secondary | ICD-10-CM

## 2017-05-10 DIAGNOSIS — I301 Infective pericarditis: Secondary | ICD-10-CM

## 2017-05-10 LAB — GLUCOSE, CAPILLARY
GLUCOSE-CAPILLARY: 99 mg/dL (ref 65–99)
GLUCOSE-CAPILLARY: 98 mg/dL (ref 65–99)
GLUCOSE-CAPILLARY: 109 mg/dL — AB (ref 65–99)
GLUCOSE-CAPILLARY: 126 mg/dL — AB (ref 65–99)
GLUCOSE-CAPILLARY: 184 mg/dL — AB (ref 65–99)

## 2017-05-10 MED ORDER — GUAIFENESIN-DM 100-10 MG/5ML PO SYRP
5.0000 mL | ORAL_SOLUTION | ORAL | Status: DC | PRN
  Administered 2017-05-10 – 2017-05-11 (×2): 5 mL via ORAL
  Filled 2017-05-10 (×2): qty 5

## 2017-05-10 MED ORDER — HYDROCORTISONE 0.5 % EX CREA
TOPICAL_CREAM | CUTANEOUS | Status: DC | PRN
  Administered 2017-05-10: 09:00:00 via TOPICAL
  Administered 2017-05-12: 1 via TOPICAL
  Administered 2017-05-14 – 2017-05-16 (×2): via TOPICAL
  Filled 2017-05-10 (×2): qty 28.35

## 2017-05-10 NOTE — Plan of Care (Signed)
  Problem: Health Behavior/Discharge Planning: Goal: Ability to manage health-related needs will improve Outcome: Progressing Pt understands current medical condition and the length of time to recovery.

## 2017-05-10 NOTE — Progress Notes (Signed)
Progress Note  Patient Name: [name] Date of Encounter: 05/10/2017  Primary Cardiologist: No primary care provider on file.   Subjective   Still has some pleuritic chest pain when lying flat but does not think it is any worse and is about the same as yesterday.  Son is concerned that she is not been out of bed.  PT was ordered yesterday but they have not assessed her yet.  We will get her out of bed to the chair today and await PT evaluation.  Planing of itchiness on her back with a slight rash.  Inpatient Medications    Scheduled Meds: . aspirin EC  81 mg Oral Daily  . carbamazepine  200 mg Oral BID  . colchicine  0.6 mg Oral BID  . diltiazem  240 mg Oral QHS  . donepezil  10 mg Oral QHS  . feeding supplement (GLUCERNA SHAKE)  237 mL Oral BID BM  . gabapentin  300 mg Oral BID  . heparin  5,000 Units Subcutaneous Q8H  . ibuprofen  800 mg Oral TID  . insulin aspart  0-5 Units Subcutaneous QHS  . insulin pump   Subcutaneous TID AC, HS, 0200  . levothyroxine  75 mcg Oral QAC breakfast  . nitrofurantoin (macrocrystal-monohydrate)  100 mg Oral Q12H  . OLANZapine  5 mg Oral QHS  . pantoprazole  40 mg Oral Daily  . pravastatin  20 mg Oral Daily   Continuous Infusions:  PRN Meds: acetaminophen, HYDROcodone-acetaminophen, hydrocortisone cream, nitroGLYCERIN, ondansetron (ZOFRAN) IV   Vital Signs    Vitals:   05/09/17 0658 05/09/17 1300 05/09/17 1940 05/10/17 0619  BP: (!) 111/55 (!) 115/59 (!) 133/49 (!) 158/70  Pulse: 71 70 88 93  Resp: _0 Temp: (!) 97.4 F (36.3 C) 97.9 F (36.6 C) 98.3 F (36.8 C) 98.1 F (36.7 C)  TempSrc: Oral Oral Oral Oral  SpO2: 97%  96% 99%  Weight: 220 lb 14.4 oz (100.2 kg)   220 lb 14.4 oz (100.2 kg)  Height:        Intake/Output Summary (Last 24 hours) at 05/10/2017 0809 Last data filed at 05/10/2017 6599 Gross per 24 hour  Intake 1080 ml  Output 700 ml  Net 380 ml   Filed Weights   05/08/17 0603 05/09/17 0658  05/10/17 0619  Weight: 219 lb (99.3 kg) 220 lb 14.4 oz (100.2 kg) 220 lb 14.4 oz (100.2 kg)    Telemetry    NSR - Personally Reviewed  ECG    No new EKG to review- Personally Reviewed  Physical Exam   GEN: No acute distress.   Neck: No JVD Cardiac: RRR, no murmurs, rubs, or gallops.  Respiratory:  Fewscattered wheezes GI: Soft, nontender, non-distended  MS: No edema; No deformity. Neuro:  Nonfocal  Psych: Normal affect  Skin: faint scattered papules on her back that appear almost like insect bites  Labs    Chemistry Recent Labs  Lab 05/05/17 0352 05/08/17 0958 05/09/17 0505  NA 135 131* 132*  K 4.6 5.0 4.2  CL 102 102 103  CO2 24 17* 21*  GLUCOSE 113* 250* 167*  BUN 10 18 24*  CREATININE 0.88 1.12* 1.05*  CALCIUM 8.7* 8.6* 8.3*  GFRNONAA >60 46* 50*  GFRAA >60 54* 58*  ANIONGAP _1 Hematology Recent Labs  Lab 05/08/17 0958 05/08/17 1647 05/09/17 0505  WBC 14.2* 12.6* 8.6  RBC 4.52 4.38 4.32  HGB 11.3* 10.7*  10.6*  HCT 35.4* 35.1* 34.3*  MCV 78.3 80.1 79.4  MCH 25.0* 24.4* 24.5*  MCHC 31.9 30.5 30.9  RDW 16.8* 16.5* 16.7*  PLT 562* 516* 538*    Cardiac EnzymesNo results for input(s): TROPONINI in the last 168 hours.  Recent Labs  Lab 05/04/17 1620  TROPIPOC 0.03     BNPNo results for input(s): BNP, PROBNP in the last 168 hours.   DDimer No results for input(s): DDIMER in the last 168 hours.   Radiology    Dg Chest Port 1 View  Result Date: 05/08/2017 CLINICAL DATA:  Chest pain, shortness of breath EXAM: PORTABLE CHEST 1 VIEW COMPARISON:  05/04/2017 FINDINGS: Consolidation in the left lower lobe with layering left effusion. No visible right effusion or confluent opacity on the right. Mild cardiomegaly. IMPRESSION: Layering left effusion with left lower lobe atelectasis or infiltrate. Electronically Signed   By: Rolm Baptise M.D.   On: 05/08/2017 18:08   Dg Abd Portable 1v  Result Date: 05/08/2017 CLINICAL DATA:  Abdominal pain  EXAM: PORTABLE ABDOMEN - 1 VIEW COMPARISON:  CT 10/16/2015 FINDINGS: Prior cholecystectomy. Moderate stool burden. Calcified phleboliths in the lower pelvis. No evidence of bowel obstruction or free air. IMPRESSION: Moderate stool burden.  No evidence of obstruction or free air. Electronically Signed   By: Rolm Baptise M.D.   On: 05/08/2017 18:08    Cardiac Studies   Echocardiogram 05/04/2017: Study Conclusions  - Left ventricle: The cavity size was normal. Systolic function was normal. The estimated ejection fraction was in the range of 60% to 65%. Wall motion was normal; there were no regional wall motion abnormalities. Doppler parameters are consistent with abnormal left ventricular relaxation (grade 1 diastolic dysfunction). - Pericardium, extracardiac: There was moderate thickening. A moderate pericardial effusion was identified circumferential to the heart. There was no evidence of hemodynamic compromise. There was no chamber collapse. There was evidence for mildly increased RV-LV interaction demonstrated by respirophasic changes in transmitral velocities. Features were not consistent with tamponade physiology.  Impressions:  - Findings suggest possible acute pericarditis with marked pericardial thickening. There is no evidence of tamponade, but there is subtle evidence of enhanced ventricular interdependence. Consider a component of constrictive physiology. Cardiac MRI may offer additional diagnostic information.  Cardiac MRI 05/05/2017  IMPRESSION: 1. Normal left ventricular size, with mild concentric hypertrophy and normal systolic function (LVEF =30). There are no regional wall motion abnormalities.  There is no late gadolinium enhancement in the left ventricular myocardium.  2. Normal right ventricular size, thickness and systolic function (LVEF =16%). There are no regional wall motion abnormalities.  3. Normal left and right  atrial size.  4. Normal size of the aortic root, ascending aorta and pulmonary artery.  5. No significant valvular abnormalities.  6. There is a small amount of pericardial effusion located predominantly lateral to the left ventricle with maximum thickness 9 mm. There is no chamber collapse, IVC is not dilated.  Pericardium is severely thickened measuring up to 6 mm with significant diffuse late gadolinium enhancement consistent with severe circumferential acute pericarditis. There is no evidence of myocarditis.  Significant epicardial fat adjacent to the right ventricle.  7. There is bilateral pleural effusion more prominent on the left.  2D echo limited 05/09/2017 Study Conclusions  - Left ventricle: The cavity size was normal. Wall thickness was   normal. Systolic function was vigorous. The estimated ejection   fraction was in the range of 65% to 70%. Wall motion was normal;   there  were no regional wall motion abnormalities. - Pericardium, extracardiac: A moderate pericardial effusion was   identified. There was no evidence of hemodynamic compromise.  Patient Profile     77 y.o. female a history of type 2 diabetes mellitus, previous TIA, hypertension, hypothyroidism, and obesity, now presenting with acute pericarditis associated with effusion and significant pericardial thickening. Evaluation has included echocardiogram and cardiac MRI as noted above.  Assessment & Plan    1.Acute pericarditis with pericardial effusion.  - Viral respiratory screen negative and ANA negative.  - CRP 17 and ESR 64.  - Thyroid studies normal.  - Troponin I negative and cardiac MRI shows no clear evidence of myocarditis.She does have severe pericardial thickening (up to 6 mm by cardiac MRI). - Chest pain continues to improve - I do not hear a rub on exam today - WBC is stable and actually trending downward - 2D echocardiogram yesterday showed no increase in size and her  pericardial effusion and no evidence of [place] nod - she will continue on colchicine 0.65m BID and motrin 8059mTID - she is on PPI for prophylaxis due to high dose NSAIDs  2.Essential hypertension.  -BP fairly well controlled on exam.  Blood pressure was 133/49 last night and this morning 158/70 mmHg -She will continue on Cardizem CD 240 mg daily  3.Hyperlipidemia - continue Pravastatin  4.  DM - continue Insulin pump - BS fairly well controlled -sugar was 98 this morning  5.  Deconditioning -She had not been out of bed since admission and PT was ordered yesterday -Awaiting evaluation by PT -Get her out of bed to chair today  6.  UTI -cultures are pending -Started on Macrobid 100 mg twice daily for 5 days -started yesterday  7.  Rash -she is complaining of a pruritic rash on her back.  There are few small papules on her back that almost look like insect bites.  It does not appear to be a drug rash but will keep an eye on it on Macrodantin last night will apply hydrocortisone cream as needed   For questions or updates, please contact [facility] consult [email or web address] for contact info under Cardiology/STEMI.      Signed, TrFransico HimMD  05/10/2017, 8:09 AM

## 2017-05-10 NOTE — Evaluation (Signed)
Physical Therapy Evaluation Patient Details Name: Mary BignessShirley H [name] MRN: [id number] DOB: 10-10-1940 Today's Date: 05/10/2017   History of Present Illness  Pt adm with large pericardial effusion and bilateral pleural effusions. Pt found to have acute percarditis. PMH - CAD, HTN, TIA, obesity, DM, lymphadema, vertigo  Clinical Impression  Pt presents to PT with very limited mobility due to weakness, dizziness, and decr balance. When pt initially reported feeling light headed sitting on BSC checked BP and it was 138/81. With further questioning son reports his mother has had dizziness for quite some time prior to admission. Pt agrees and when asked stated that she sometimes gets dizzy turning over in the bed. With bilateral head turns pt reported incr dizziness. Noted after eval completed that per the chart pt with history of vertigo which she didn't tell me. Will have next PT treatment by PT with vestibular training further assess her dizziness. Pt will need ST-SNF at DC as she is unable to manage basic mobility and care on her own and lives alone. Sons live out of town and unable to provide 24 hour care.     Follow Up Recommendations SNF    Equipment Recommendations  Rolling walker with 5" wheels    Recommendations for Other Services       Precautions / Restrictions Precautions Precautions: Fall Restrictions Weight Bearing Restrictions: No      Mobility  Bed Mobility               General bed mobility comments: Pt on bsc  Transfers Overall transfer level: Needs assistance Equipment used: Rolling walker (2 wheeled) Transfers: Sit to/from Stand Sit to Stand: Min assist         General transfer comment: Assist to bring hips up and for balance  Ambulation/Gait Ambulation/Gait assistance: Min assist Ambulation Distance (Feet): 12 Feet Assistive device: Rolling walker (2 wheeled) Gait Pattern/deviations: Step-through pattern;Decreased step length - right;Decreased step  length - left;Shuffle Gait velocity: decr Gait velocity interpretation: <1.8 ft/sec, indicate of risk for recurrent falls General Gait Details: Assist for balance and encouragement to continue. Pt c/o of being light headed. Checked BP 138/81. Per son pt has been dizzy for quite some time. Amb on RA with SpO2>92%  Stairs            Wheelchair Mobility    Modified Rankin (Stroke Patients Only)       Balance Overall balance assessment: Needs assistance Sitting-balance support: No upper extremity supported;Feet supported Sitting balance-Leahy Scale: Fair     Standing balance support: Bilateral upper extremity supported Standing balance-Leahy Scale: Poor Standing balance comment: walker and min assist for static standing                             Pertinent Vitals/Pain Pain Assessment: Faces Faces Pain Scale: Hurts little more Pain Location: chest Pain Descriptors / Indicators: Sore Pain Intervention(s): Limited activity within patient's tolerance;Monitored during session    Home Living Family/patient expects to be discharged to:: Private residence Living Arrangements: Alone Available Help at Discharge: Friend(s);Available PRN/intermittently Type of Home: House Home Access: Level entry     Home Layout: One level Home Equipment: Cane - quad      Prior Function Level of Independence: Needs assistance   Gait / Transfers Assistance Needed: Until last month pt amb independently without assistive device. Last 2 weeks pt has been primarily in bed with only minimal household amb using furniture for support  ADL's /  Homemaking Assistance Needed: Pt reports she has needed assist with all household task including meals over last month.        Hand Dominance        Extremity/Trunk Assessment   Upper Extremity Assessment Upper Extremity Assessment: Generalized weakness    Lower Extremity Assessment Lower Extremity Assessment: Generalized weakness        Communication   Communication: HOH  Cognition Arousal/Alertness: Awake/alert Behavior During Therapy: Flat affect Overall Cognitive Status: Impaired/Different from baseline Area of Impairment: Problem solving                             Problem Solving: Slow processing;Requires verbal cues General Comments: Pt needs encouragement to participate      General Comments      Exercises     Assessment/Plan    PT Assessment Patient needs continued PT services  PT Problem List Decreased strength;Decreased activity tolerance;Decreased balance;Decreased mobility;Decreased knowledge of use of DME;Obesity       PT Treatment Interventions DME instruction;Gait training;Functional mobility training;Therapeutic activities;Therapeutic exercise;Balance training;Patient/family education    PT Goals (Current goals can be found in the Care Plan section)  Acute Rehab PT Goals Patient Stated Goal: Not stated PT Goal Formulation: With patient/family Time For Goal Achievement: 05/24/17 Potential to Achieve Goals: Good    Frequency Min 3X/week   Barriers to discharge Decreased caregiver support Lives alone and sons live out of town    Co-evaluation               AM-PAC PT "6 Clicks" Daily Activity  Outcome Measure Difficulty turning over in bed (including adjusting bedclothes, sheets and blankets)?: A Little Difficulty moving from lying on back to sitting on the side of the bed? : Unable Difficulty sitting down on and standing up from a chair with arms (e.g., wheelchair, bedside commode, etc,.)?: Unable Help needed moving to and from a bed to chair (including a wheelchair)?: A Little Help needed walking in hospital room?: A Little Help needed climbing 3-5 steps with a railing? : A Lot 6 Click Score: 13    End of Session Equipment Utilized During Treatment: Gait belt Activity Tolerance: Patient limited by fatigue Patient left: in chair;with call bell/phone within  reach;with chair alarm set;with family/visitor present Nurse Communication: Mobility status PT Visit Diagnosis: Unsteadiness on feet (R26.81);Other abnormalities of gait and mobility (R26.89);History of falling (Z91.81);Dizziness and giddiness (R42)    Time: 1191-4782 PT Time Calculation (min) (ACUTE ONLY): 33 min   Charges:   PT Evaluation $PT Eval Moderate Complexity: 1 Mod PT Treatments $Gait Training: 8-22 mins   PT G Codes:        [facility] PT [phone]   Angelina Ok [facility] 05/10/2017, 10:44 AM

## 2017-05-10 NOTE — Clinical Social Work Note (Signed)
Clinical Social Work Assessment  Patient Details  Name: [name] MRN: [id number] Date of Birth: 1940/06/27  Date of referral:  05/10/17               Reason for consult:  Facility Placement, Discharge Planning                Permission sought to share information with:  Facility Sport and exercise psychologist, Family Supports Permission granted to share information::  Yes, Verbal Permission Granted  Name::     [place]::  SNFs  Relationship::  son  Contact Information:  [phone]  Housing/Transportation Living arrangements for the past 2 months:  [place] of Information:  Patient, Adult Children Patient Interpreter Needed:  None Criminal Activity/Legal Involvement Pertinent to Current Situation/Hospitalization:  No - Comment as needed Significant Relationships:  Adult Children Lives with:  Self Do you feel safe going back to the place where you live?  Yes Need for family participation in patient care:  No (Coment)  Care giving concerns:  Patient from home independently. PT recommending SNF.   Social Worker assessment / plan: CSW met with patient and son, Clint, at bedside. Patient alert and oriented. CSW discussed PT recommendation for SNF. Patient and son both agreeable to short term SNF. Son with concern about patient's condition, particularly her "swimmy headedness" and stated he has not gotten to discuss this with the MD. Son would like to discuss with MD before patient is discharged.  Patient and son agreeable to send out SNF referrals. CSW to send out initial referrals and will follow up to give bed offers. Patient will need [facility] authorization before admitting to facility. CSW to follow and support with discharge when medically ready.  Employment status:  Retired Research officer, political party) PT Recommendations:  [place] / Referral to community resources:  [place]  Patient/Family's Response to care: Patient and son appreciative of care.  Patient/Family's Understanding of and Emotional Response to Diagnosis, Current Treatment, and Prognosis: Patient and son with understanding of patient's condition and agreeable to SNF.  Emotional Assessment Appearance:  Appears stated age Attitude/Demeanor/Rapport:  Engaged Affect (typically observed):  Appropriate, Accepting, Pleasant Orientation:  Oriented to Self, Oriented to Place, Oriented to  Time, Oriented to Situation Alcohol / Substance use:  Not Applicable Psych involvement (Current and /or in the community):  No (Comment)  Discharge Needs  Concerns to be addressed:  Discharge Planning Concerns, Care Coordination Readmission within the last 30 days:  No Current discharge risk:  Physical Impairment Barriers to Discharge:  Continued Medical Work up   Estanislado Emms, LCSW 05/10/2017, 11:18 AM

## 2017-05-10 NOTE — NC FL2 (Signed)
[place] MEDICAID FL2 LEVEL OF CARE SCREENING TOOL     IDENTIFICATION  Patient Name: [name] Birthdate: 10/01/1940 Sex: female Admission Date (Current Location): 05/04/2017  [facility] [place]   Facility and Address:  [facility] [place], [address], [place], [place] [address]      Provider Number: [id number]  Attending Physician Name and Address:  Rollene RotundaHochrein, James, MD  Relative Name and Phone Number:  Delma FreezeClinton Gilham, son, [phone]    Current Level of Care: Hospital Recommended Level of Care: Skilled Nursing Facility Prior Approval Number:    Date Approved/Denied:   PASRR Number: [id number]  Discharge Plan: SNF    Current Diagnoses: Patient Active Problem List   Diagnosis Date Noted  . Pericarditis 05/06/2017  . Pericardial effusion 05/04/2017  . OSA (obstructive sleep apnea) 02/18/2017  . Dyspnea 01/27/2015  . Atypical chest pain 12/24/2014  . SOB (shortness of breath)   . Accelerating angina (HCC) 12/23/2014  . Chest pain at rest 10/30/2013  . Exertional dyspnea 10/30/2013  . Hyperlipidemia 10/30/2013  . Morbid obesity (HCC) 10/30/2013  . Acquired autoimmune hypothyroidism 09/02/2013  . Type II or unspecified type diabetes mellitus without mention of complication, uncontrolled 06/14/2013  . Essential hypertension, benign 06/14/2013  . Hyperlipemia 06/14/2013    Orientation RESPIRATION BLADDER Height & Weight     Self, Time, Situation, Place  O2(nasal cannula 2L) Incontinent, External catheter Weight: 220 lb 14.4 oz (100.2 kg) Height:  5\' 3"  (160 cm)  BEHAVIORAL SYMPTOMS/MOOD NEUROLOGICAL BOWEL NUTRITION STATUS      Incontinent Diet(please see DC summary)  AMBULATORY STATUS COMMUNICATION OF NEEDS Skin   Limited Assist Verbally Normal                       Personal Care Assistance Level of Assistance  Bathing, Feeding, Dressing Bathing Assistance: Limited assistance Feeding assistance:  Independent Dressing Assistance: Limited assistance     Functional Limitations Info  Sight, Hearing, Speech Sight Info: Adequate Hearing Info: Adequate Speech Info: Adequate    SPECIAL CARE FACTORS FREQUENCY  PT (By licensed PT)     PT Frequency: 5x/week              Contractures Contractures Info: Not present    Additional Factors Info  Code Status, Allergies, Insulin Sliding Scale Code Status Info: Full Allergies Info: Flagyl Metronidazole Hcl, Ciprofloxacin, Metformin And Related, Milk-related Compounds, Other   Insulin Sliding Scale Info: insulin pump 3x/day with meals; novolog daily at bedtime       Current Medications (05/10/2017):  This is the current hospital active medication list Current Facility-Administered Medications  Medication Dose Route Frequency Provider Last Rate Last Dose  . acetaminophen (TYLENOL) tablet 650 mg  650 mg Oral Q4H PRN Marcelino Dusteruke, Angela Nicole, PA   650 mg at 05/05/17 1459  . aspirin EC tablet 81 mg  81 mg Oral Daily Chilton Siandolph, Tiffany, MD   81 mg at 05/10/17 0840  . carbamazepine (TEGRETOL XR) 12 hr tablet 200 mg  200 mg Oral BID Marcelino Dusteruke, Angela Nicole, PA   200 mg at 05/10/17 [id number]  . colchicine tablet 0.6 mg  0.6 mg Oral BID Chilton Siandolph, Tiffany, MD   0.6 mg at 05/10/17 0840  . diltiazem (CARDIZEM CD) 24 hr capsule 240 mg  240 mg Oral QHS Marcelino DusterDuke, Angela Nicole, [place]   240 mg at 05/09/17 2124  . donepezil (ARICEPT) tablet 10 mg  10 mg Oral QHS Duke, Roe RutherfordAngela Nicole, [place]  10 mg at 05/09/17 2124  . feeding supplement (GLUCERNA SHAKE) (GLUCERNA SHAKE) liquid 237 mL  237 mL Oral BID BM Rollene Rotunda, MD   237 mL at 05/09/17 2149  . gabapentin (NEURONTIN) capsule 300 mg  300 mg Oral BID Marcelino Duster, PA   300 mg at 05/10/17 0840  . guaiFENesin-dextromethorphan (ROBITUSSIN DM) 100-10 MG/5ML syrup 5 mL  5 mL Oral Q4H PRN Rollene Rotunda, MD      . heparin injection 5,000 Units  5,000 Units Subcutaneous Q8H Marcelino Duster, [place]   5,000 Units at 05/10/17  0520  . HYDROcodone-acetaminophen (NORCO/VICODIN) 5-325 MG per tablet 1 tablet  1 tablet Oral Q6H PRN Chilton Si, MD   1 tablet at 05/09/17 0932  . hydrocortisone cream 0.5 %   Topical PRN Rollene Rotunda, MD      . ibuprofen (ADVIL,MOTRIN) tablet 800 mg  800 mg Oral TID Chilton Si, MD   800 mg at 05/10/17 0840  . insulin aspart (novoLOG) injection 0-5 Units  0-5 Units Subcutaneous QHS Nada Boozer R, NP      . insulin pump   Subcutaneous TID AC, HS, 0200 Nada Boozer R, NP      . levothyroxine (SYNTHROID, LEVOTHROID) tablet 75 mcg  75 mcg Oral QAC breakfast Marcelino Duster, [place]   75 mcg at 05/10/17 1610  . nitrofurantoin (macrocrystal-monohydrate) (MACROBID) capsule 100 mg  100 mg Oral Q12H Georgie Chard D, NP   100 mg at 05/10/17 0840  . nitroGLYCERIN (NITROSTAT) SL tablet 0.4 mg  0.4 mg Sublingual Q5 Min x 3 PRN Marcelino Duster, PA      . OLANZapine (ZYPREXA) tablet 5 mg  5 mg Oral QHS Marcelino Duster, [place]   5 mg at 05/09/17 2125  . ondansetron (ZOFRAN) injection 4 mg  4 mg Intravenous Q6H PRN Marcelino Duster, PA   4 mg at 05/08/17 1415  . pantoprazole (PROTONIX) EC tablet 40 mg  40 mg Oral Daily Marcelino Duster, [place]   40 mg at 05/10/17 0841  . pravastatin (PRAVACHOL) tablet 20 mg  20 mg Oral Daily Marcelino Duster, [place]   20 mg at 05/10/17 9604     Discharge Medications: Please see discharge summary for a list of discharge medications.  Relevant Imaging Results:  Relevant Lab Results:   Additional Information SSN: [id number]  Abigail Butts, LCSW

## 2017-05-11 ENCOUNTER — Inpatient Hospital Stay (HOSPITAL_COMMUNITY): Payer: Medicare Other

## 2017-05-11 LAB — GLUCOSE, CAPILLARY
GLUCOSE-CAPILLARY: 143 mg/dL — AB (ref 65–99)
GLUCOSE-CAPILLARY: 203 mg/dL — AB (ref 65–99)
Glucose-Capillary: 186 mg/dL — ABNORMAL HIGH (ref 65–99)
Glucose-Capillary: 138 mg/dL — ABNORMAL HIGH (ref 65–99)
Glucose-Capillary: 119 mg/dL — ABNORMAL HIGH (ref 65–99)

## 2017-05-11 MED ORDER — LEVALBUTEROL HCL 0.63 MG/3ML IN NEBU
0.6300 mg | INHALATION_SOLUTION | Freq: Four times a day (QID) | RESPIRATORY_TRACT | Status: DC
  Administered 2017-05-11 – 2017-05-12 (×5): 0.63 mg via RESPIRATORY_TRACT
  Filled 2017-05-11 (×5): qty 3

## 2017-05-11 MED ORDER — DILTIAZEM HCL ER COATED BEADS 180 MG PO CP24
300.0000 mg | ORAL_CAPSULE | Freq: Every day | ORAL | Status: DC
  Administered 2017-05-11: 300 mg via ORAL
  Filled 2017-05-11: qty 1

## 2017-05-11 NOTE — Progress Notes (Signed)
Placed pt on nasal cpap pt states she normally uses the nasal prong CPAP flow at home pt advised we don't have that type here at our facility.  She is ok with trying our mask.

## 2017-05-11 NOTE — Progress Notes (Signed)
Progress Note  Patient Name: [name] Date of Encounter: 05/11/2017  Primary Cardiologist: No primary care provider on file.   Subjective   She complains of increased cough with chest congestion and problems with dizziness when she is up moving around although blood pressure was normal during 1 of the episodes of dizziness.  She does have a history of vertigo.  Her son is also concerned that she is having periods where she seems blanked out  Inpatient Medications    Scheduled Meds: . aspirin EC  81 mg Oral Daily  . carbamazepine  200 mg Oral BID  . colchicine  0.6 mg Oral BID  . diltiazem  240 mg Oral QHS  . donepezil  10 mg Oral QHS  . feeding supplement (GLUCERNA SHAKE)  237 mL Oral BID BM  . gabapentin  300 mg Oral BID  . heparin  5,000 Units Subcutaneous Q8H  . ibuprofen  800 mg Oral TID  . insulin aspart  0-5 Units Subcutaneous QHS  . insulin pump   Subcutaneous TID AC, HS, 0200  . levothyroxine  75 mcg Oral QAC breakfast  . nitrofurantoin (macrocrystal-monohydrate)  100 mg Oral Q12H  . OLANZapine  5 mg Oral QHS  . pantoprazole  40 mg Oral Daily  . pravastatin  20 mg Oral Daily   Continuous Infusions:  PRN Meds: acetaminophen, guaiFENesin-dextromethorphan, HYDROcodone-acetaminophen, hydrocortisone cream, nitroGLYCERIN, ondansetron (ZOFRAN) IV   Vital Signs    Vitals:   05/10/17 0619 05/10/17 1442 05/10/17 2015 05/11/17 0615  BP: (!) 158/70 (!) 160/70 (!) 169/79 (!) 166/51  Pulse: 93 93 92 99  Resp: 16  17 19   Temp: 98.1 F (36.7 C) 98.2 F (36.8 C) 98.5 F (36.9 C) 97.7 F (36.5 C)  TempSrc: Oral  Oral Oral  SpO2: 99% 95% 95% 95%  Weight: 220 lb 14.4 oz (100.2 kg)   221 lb 4.8 oz (100.4 kg)  Height:        Intake/Output Summary (Last 24 hours) at 05/11/2017 0914 Last data filed at 05/11/2017 4142 Gross per 24 hour  Intake 240 ml  Output 1700 ml  Net -1460 ml   Filed Weights   05/09/17 0658 05/10/17 0619 05/11/17 0615  Weight: 220 lb 14.4  oz (100.2 kg) 220 lb 14.4 oz (100.2 kg) 221 lb 4.8 oz (100.4 kg)    Telemetry    Sinus rhythm- Personally Reviewed  ECG    No new EKG to review- Personally Reviewed  Physical Exam   GEN: No acute distress.   Neck: No JVD Cardiac: RRR, no murmurs, rubs, or gallops.  Respiratory:  Expiratory wheezes anteriorly  GI: Soft, nontender, non-distended  MS: No edema; No deformity. Neuro:  Nonfocal  Psych: Normal affect   Labs    Chemistry Recent Labs  Lab 05/05/17 0352 05/08/17 0958 05/09/17 0505  NA 135 131* 132*  K 4.6 5.0 4.2  CL 102 102 103  CO2 24 17* 21*  GLUCOSE 113* 250* 167*  BUN 10 18 24*  CREATININE 0.88 1.12* 1.05*  CALCIUM 8.7* 8.6* 8.3*  GFRNONAA >60 46* 50*  GFRAA >60 54* 58*  ANIONGAP 9 12 8      Hematology Recent Labs  Lab 05/08/17 0958 05/08/17 1647 05/09/17 0505  WBC 14.2* 12.6* 8.6  RBC 4.52 4.38 4.32  HGB 11.3* 10.7* 10.6*  HCT 35.4* 35.1* 34.3*  MCV 78.3 80.1 79.4  MCH 25.0* 24.4* 24.5*  MCHC 31.9 30.5 30.9  RDW 16.8* 16.5* 16.7*  PLT 562* 516*  538*    Cardiac EnzymesNo results for input(s): TROPONINI in the last 168 hours.  Recent Labs  Lab 05/04/17 1620  TROPIPOC 0.03     BNPNo results for input(s): BNP, PROBNP in the last 168 hours.   DDimer No results for input(s): DDIMER in the last 168 hours.   Radiology    No results found.  Cardiac Studies   Echocardiogram 05/04/2017: Study Conclusions  - Left ventricle: The cavity size was normal. Systolic function was normal. The estimated ejection fraction was in the range of 60% to 65%. Wall motion was normal; there were no regional wall motion abnormalities. Doppler parameters are consistent with abnormal left ventricular relaxation (grade 1 diastolic dysfunction). - Pericardium, extracardiac: There was moderate thickening. A moderate pericardial effusion was identified circumferential to the heart. There was no evidence of hemodynamic compromise.  There was no chamber collapse. There was evidence for mildly increased RV-LV interaction demonstrated by respirophasic changes in transmitral velocities. Features were not consistent with tamponade physiology.  Impressions:  - Findings suggest possible acute pericarditis with marked pericardial thickening. There is no evidence of tamponade, but there is subtle evidence of enhanced ventricular interdependence. Consider a component of constrictive physiology. Cardiac MRI may offer additional diagnostic information.  Cardiac MRI 05/05/2017  IMPRESSION: 1. Normal left ventricular size, with mild concentric hypertrophy and normal systolic function (LVEF =07). There are no regional wall motion abnormalities.  There is no late gadolinium enhancement in the left ventricular myocardium.  2. Normal right ventricular size, thickness and systolic function (LVEF =37%). There are no regional wall motion abnormalities.  3. Normal left and right atrial size.  4. Normal size of the aortic root, ascending aorta and pulmonary artery.  5. No significant valvular abnormalities.  6. There is a small amount of pericardial effusion located predominantly lateral to the left ventricle with maximum thickness 9 mm. There is no chamber collapse, IVC is not dilated.  Pericardium is severely thickened measuring up to 6 mm with significant diffuse late gadolinium enhancement consistent with severe circumferential acute pericarditis. There is no evidence of myocarditis.  Significant epicardial fat adjacent to the right ventricle.  7. There is bilateral pleural effusion more prominent on the left.  2D echo limited 05/09/2017 Study Conclusions  - Left ventricle: The cavity size was normal. Wall thickness was normal. Systolic function was vigorous. The estimated ejection fraction was in the range of 65% to 70%. Wall motion was normal; there were no regional wall  motion abnormalities. - Pericardium, extracardiac: A moderate pericardial effusion was identified. There was no evidence of hemodynamic compromise.  Patient Profile     77 y.o. female a history of type 2 diabetes mellitus, previous TIA, hypertension, hypothyroidism, and obesity, now presenting with acute pericarditis associated with effusion and significant pericardial thickening. Evaluation has included echocardiogram and cardiac MRI as noted above.  Assessment & Plan    1.Acute pericarditiswith pericardial effusion.  -Viral respiratory screen negative and ANA negative.  -CRP 17 and ESR 64.  -Thyroid studies normal.  -Troponin I negative and cardiac MRI shows no clear evidence of myocarditis.She does have severe pericardial thickening (up to 6 mm by cardiac MRI). - Still complains of pleuritic chest pain - I do not hear a rub on exam today - WBC has normalized - 2D echocardiogram 4/22 showed no increase in size of pericardial effusion and no evidence of Tamponade - she will continue on colchicine 0.'6mg'$  BID and motrin '800mg'$  TID - she is on PPI for prophylaxis due  to high dose NSAIDs  2.Essential hypertension.  -BP elevated on exam today as well as yesterday.  This a.m. it was 166/51 mmHg. -I am going to increase her Cardizem CD to 300 mg a day as her heart rate is also elevated and continue to titrate as needed for high blood pressure  3.Hyperlipidemia -continue Pravastatin -LDL is at goal at 73  4. DM - continue Insulin pump - BS for the most part have been controlled but slightly more elevated today at 203  5.  Deconditioning -She had not been out of bed since admission -Appreciate PT evaluation -She will need skilled nursing facility upon discharge  6.  UTI -cultures are pending -Started on Macrobid 100 mg twice daily for 5 days - Day #3  7.  Rash -denies any itching on her back today  8.  Dizziness -She has a history of vertigo in the past  and physical therapy is going to work with her today -Her son is very concerned that her symptoms of numbness are not like her typical vertigo.  It is hard to determine whether it is just due to overall deconditioning and she is feeling lightheaded when she gets up and moves around. -BP has been normal during her episodes of dizziness -Her son is also concerned that she intermittently is having episodes where she compares completely blanked out -I will ask neurology to evaluate  9.  Cough with chest congestion -She is having some wheezing on exam and when she coughs her chest sounds congested -I will check a PA lateral chest x-ray today -Start Xopenex nebulizer treatments -Encouraged her to be up out of bed in the chair   For questions or updates, please contact [facility] Please consult [email or web address] for contact info under Cardiology/STEMI.      Signed, Fransico Him, MD  05/11/2017, 9:14 AM

## 2017-05-11 NOTE — Progress Notes (Signed)
CSW met with patient and son at bedside and gave SNF offers. Patient and son reviewed and chose [facility]. CSW to follow and support with discharge when medically ready.   , LCSWA [phone]  

## 2017-05-11 NOTE — Progress Notes (Signed)
Physical Therapy Treatment Patient Details Name: Mary BignessShirley H Cockerham MRN: [id number] DOB: 1940-08-07 Today's Date: 05/11/2017    History of Present Illness Pt adm with large pericardial effusion and bilateral pleural effusions. Pt found to have acute percarditis. PMH - CAD, HTN, TIA, obesity, DM, lymphadema, vertigo    PT Comments    Pt admitted with above diagnosis. Pt currently with functional limitations due to the deficits listed below (see PT Problem List). Pt was able to stand to RW with min assist for NTs to assist her with bath.  Pt desat to 74% on RA with activity and then to 90% when pt sits back down.  Pt c/o pain in upper chest after standing which she states MD and nurse are aware of. See BP's taken below. Vertigo assessment negative and could not elicit any vertigo today.  Pt will benefit from skilled PT to increase their independence and safety with mobility to allow discharge to the venue listed below.     Orthostatic BPs  Supine 130/81, 108 bpm  Sitting 128/70, 107 bpm  Standing 134/103, 113 bpm  Standing after 3 min 139/75, 121 bpm    Follow Up Recommendations  SNF     Equipment Recommendations  Rolling walker with 5" wheels    Recommendations for Other Services       Precautions / Restrictions Precautions Precautions: Fall Restrictions Weight Bearing Restrictions: No    Mobility  Bed Mobility               General bed mobility comments: Pt up in chair  Transfers Overall transfer level: Needs assistance Equipment used: Rolling walker (2 wheeled) Transfers: Sit to/from Stand Sit to Stand: Min assist         General transfer comment: Assist to power up to bring hips up and for balance.Pt reports that she feels "tight" in her upper chest when she moves.  O2 sats did drop to 74% on RA with standing but came back up to 90% once pt sat down and took some deep breaths.  Other VSS.  NTs had come in to bathe pt therefore had pt stand to be bathed and was  taking BPs.  See orthostatic BP report - pt BP not orthostatic.  Ambulation/Gait                 Stairs             Wheelchair Mobility    Modified Rankin (Stroke Patients Only)       Balance Overall balance assessment: Needs assistance Sitting-balance support: No upper extremity supported;Feet supported Sitting balance-Leahy Scale: Fair     Standing balance support: Bilateral upper extremity supported Standing balance-Leahy Scale: Poor Standing balance comment: walker and min assist for static standing                            Cognition Arousal/Alertness: Awake/alert Behavior During Therapy: Flat affect Overall Cognitive Status: Impaired/Different from baseline Area of Impairment: Problem solving                             Problem Solving: Slow processing;Requires verbal cues General Comments: Pt needs encouragement to participate      Exercises      General Comments General comments (skin integrity, edema, etc.): Full vestibular assessment completed however could not elicit any vertigo.        Pertinent Vitals/Pain Pain Assessment: Faces  Faces Pain Scale: Hurts little more Pain Location: chest Pain Descriptors / Indicators: Sore Pain Intervention(s): Limited activity within patient's tolerance;Monitored during session;Repositioned    Home Living                      Prior Function            PT Goals (current goals can now be found in the care plan section) Acute Rehab PT Goals Patient Stated Goal: Not stated Progress towards PT goals: Progressing toward goals    Frequency    Min 3X/week      PT Plan Current plan remains appropriate    Co-evaluation              AM-PAC PT "6 Clicks" Daily Activity  Outcome Measure  Difficulty turning over in bed (including adjusting bedclothes, sheets and blankets)?: A Little Difficulty moving from lying on back to sitting on the side of the bed? :  Unable Difficulty sitting down on and standing up from a chair with arms (e.g., wheelchair, bedside commode, etc,.)?: A Little Help needed moving to and from a bed to chair (including a wheelchair)?: A Little Help needed walking in hospital room?: A Little Help needed climbing 3-5 steps with a railing? : A Lot 6 Click Score: 15    End of Session Equipment Utilized During Treatment: Gait belt Activity Tolerance: Patient limited by fatigue;Patient limited by pain Patient left: in chair;with call bell/phone within reach;with chair alarm set Nurse Communication: Mobility status PT Visit Diagnosis: Unsteadiness on feet (R26.81);Other abnormalities of gait and mobility (R26.89);History of falling (Z91.81);Dizziness and giddiness (R42)     Time: 1610-9604 PT Time Calculation (min) (ACUTE ONLY): 11 min  Charges:  $Therapeutic Activity: 8-22 mins                    G Codes:       Jazmynn Pho,PT Acute Rehabilitation [phone] (pager)    Berline Lopes 05/11/2017, 1:43 PM

## 2017-05-11 NOTE — Progress Notes (Signed)
Pt complains of increased non productive cough and chest soreness form coughing. Son asking about checking a chest xray. Pt given cough medicine per PRN orders and pt became nauseated soon after. Report given and dayshift nurse to give PRN nausea medication and follow up with MD regarding CXR. Dierdre HighmanHall, Brailynn Breth Marie, RN

## 2017-05-12 ENCOUNTER — Inpatient Hospital Stay (HOSPITAL_COMMUNITY): Payer: Medicare Other

## 2017-05-12 DIAGNOSIS — R6889 Other general symptoms and signs: Secondary | ICD-10-CM

## 2017-05-12 DIAGNOSIS — R4182 Altered mental status, unspecified: Secondary | ICD-10-CM

## 2017-05-12 DIAGNOSIS — R059 Cough, unspecified: Secondary | ICD-10-CM

## 2017-05-12 DIAGNOSIS — R0602 Shortness of breath: Secondary | ICD-10-CM

## 2017-05-12 DIAGNOSIS — R05 Cough: Secondary | ICD-10-CM

## 2017-05-12 LAB — URINALYSIS, ROUTINE W REFLEX MICROSCOPIC
BACTERIA UA: NONE SEEN
Bilirubin Urine: NEGATIVE
Glucose, UA: NEGATIVE mg/dL
HGB URINE DIPSTICK: NEGATIVE
Ketones, ur: NEGATIVE mg/dL
Nitrite: NEGATIVE
PROTEIN: NEGATIVE mg/dL
Specific Gravity, Urine: 1.016 (ref 1.005–1.030)
pH: 5 (ref 5.0–8.0)

## 2017-05-12 LAB — CBC WITH DIFFERENTIAL/PLATELET
BASOS ABS: 0 10*3/uL (ref 0.0–0.1)
BASOS PCT: 0 %
Eosinophils Absolute: 0.2 10*3/uL (ref 0.0–0.7)
Eosinophils Relative: 2 %
HCT: 38.5 % (ref 36.0–46.0)
Hemoglobin: 12 g/dL (ref 12.0–15.0)
Lymphocytes Relative: 16 %
Lymphs Abs: 1.5 10*3/uL (ref 0.7–4.0)
MCH: 24.9 pg — ABNORMAL LOW (ref 26.0–34.0)
MCHC: 31.2 g/dL (ref 30.0–36.0)
MCV: 80 fL (ref 78.0–100.0)
Monocytes Absolute: 1.1 10*3/uL — ABNORMAL HIGH (ref 0.1–1.0)
Monocytes Relative: 13 %
NEUTROS ABS: 6.2 10*3/uL (ref 1.7–7.7)
NEUTROS PCT: 69 %
Platelets: 517 10*3/uL — ABNORMAL HIGH (ref 150–400)
RBC: 4.81 MIL/uL (ref 3.87–5.11)
RDW: 16.8 % — ABNORMAL HIGH (ref 11.5–15.5)
WBC: 9 10*3/uL (ref 4.0–10.5)

## 2017-05-12 LAB — BASIC METABOLIC PANEL
ANION GAP: 10 (ref 5–15)
BUN: 13 mg/dL (ref 6–20)
CALCIUM: 9.4 mg/dL (ref 8.9–10.3)
CHLORIDE: 104 mmol/L (ref 101–111)
CO2: 22 mmol/L (ref 22–32)
CREATININE: 0.78 mg/dL (ref 0.44–1.00)
GFR calc non Af Amer: >60 mL/min (ref >60)
Glucose, Bld: 166 mg/dL — ABNORMAL HIGH (ref 65–99)
Potassium: 4.4 mmol/L (ref 3.5–5.1)
SODIUM: 136 mmol/L (ref 135–145)

## 2017-05-12 LAB — BRAIN NATRIURETIC PEPTIDE: B Natriuretic Peptide: 139.3 pg/mL — ABNORMAL HIGH (ref 0.0–100.0)

## 2017-05-12 LAB — GLUCOSE, CAPILLARY
GLUCOSE-CAPILLARY: 161 mg/dL — AB (ref 65–99)
GLUCOSE-CAPILLARY: 164 mg/dL — AB (ref 65–99)
GLUCOSE-CAPILLARY: 140 mg/dL — AB (ref 65–99)
GLUCOSE-CAPILLARY: 135 mg/dL — AB (ref 65–99)
Glucose-Capillary: 115 mg/dL — ABNORMAL HIGH (ref 65–99)

## 2017-05-12 MED ORDER — PIPERACILLIN-TAZOBACTAM 3.375 G IVPB
3.3750 g | Freq: Three times a day (TID) | INTRAVENOUS | Status: DC
  Administered 2017-05-12 – 2017-05-17 (×14): 3.375 g via INTRAVENOUS
  Filled 2017-05-12 (×15): qty 50

## 2017-05-12 MED ORDER — GLUCERNA SHAKE PO LIQD
237.0000 mL | ORAL | Status: DC
  Administered 2017-05-13 – 2017-05-14 (×2): 237 mL via ORAL
  Filled 2017-05-12: qty 237

## 2017-05-12 MED ORDER — IPRATROPIUM-ALBUTEROL 0.5-2.5 (3) MG/3ML IN SOLN
3.0000 mL | Freq: Four times a day (QID) | RESPIRATORY_TRACT | Status: DC
  Administered 2017-05-12 (×2): 3 mL via RESPIRATORY_TRACT
  Filled 2017-05-12 (×3): qty 3

## 2017-05-12 MED ORDER — PIPERACILLIN-TAZOBACTAM 3.375 G IVPB
3.3750 g | Freq: Three times a day (TID) | INTRAVENOUS | Status: DC
  Filled 2017-05-12 (×3): qty 50

## 2017-05-12 MED ORDER — MONTELUKAST SODIUM 5 MG PO CHEW
5.0000 mg | CHEWABLE_TABLET | Freq: Every day | ORAL | Status: DC
  Administered 2017-05-12 – 2017-05-16 (×5): 5 mg via ORAL
  Filled 2017-05-12 (×6): qty 1

## 2017-05-12 MED ORDER — DILTIAZEM HCL ER COATED BEADS 180 MG PO CP24
360.0000 mg | ORAL_CAPSULE | Freq: Every day | ORAL | Status: DC
  Administered 2017-05-12 – 2017-05-16 (×5): 360 mg via ORAL
  Filled 2017-05-12 (×5): qty 2

## 2017-05-12 MED ORDER — LOPERAMIDE HCL 2 MG PO CAPS
2.0000 mg | ORAL_CAPSULE | ORAL | Status: DC | PRN
  Administered 2017-05-12: 2 mg via ORAL
  Filled 2017-05-12: qty 1

## 2017-05-12 MED ORDER — COLCHICINE 0.6 MG PO TABS: 0.6000 mg | ORAL_TABLET | Freq: Every day | ORAL | Status: DC

## 2017-05-12 NOTE — Progress Notes (Signed)
Called by RN re: Diarrhea  Spoke w/ Dr Turner>>ck for C diff, enteric precautions, d/c colchicine.  Rec'd 2nd call from RN re: temp 102.7 w/ chills, BP/HR are up  Spoke w/ Dr Turner>>Dr Sherryll BurgerShah contacted for management.   Theodore Demarkhonda Barrett, PA-C 05/12/2017 5:10 PM Beeper [phone]

## 2017-05-12 NOTE — Progress Notes (Signed)
Received call from primary team that patient now spiked fever 102,  Agree with checking Cdiff Get blood and urine culture with sputum culture Will add zosyn Rpt xray in am if continue to have singificant effusion and if patient agreeable will benefit from thoracentasis  [facility]

## 2017-05-12 NOTE — Progress Notes (Signed)
Patient has home CPAP unit at bedside. Patient states she places self on and off when ready. RT informed patient if she needs any help have RN contact RT. 

## 2017-05-12 NOTE — Progress Notes (Signed)
Patient was c/o having diarrhea. Pt had 1 bm this morning, small, brown and chunks. Not liquid. Did speak to pharmacist, and pt is taking colchicine, which can cause increased bowel movements or diarrhea. Will continue to monitor.

## 2017-05-12 NOTE — Procedures (Signed)
Date of recording 05/12/2017  Referring physician Dr. Katrinka BlazingSmith  Reason for the study Altered mental status  Technical Digital EEG recording using 10-20 International electrode system  Description of the recording At times posterior rhythm is 5 Hz with reactivity symmetrical Very frequent sometimes continuous generalized rhythmic delta activity Sleep architecture was not seen.   Impression Abormal EEG showing very frequent and sometimes continuous generalized rhythmic delta activity these findings are nonspecific and can suggest generalized moderate cerebral dysfunction and sometimes in the right clinical context may suggest seizure-like pattern. Consider continuous EEG monitoring

## 2017-05-12 NOTE — Progress Notes (Signed)
EEG completed; results pending.    

## 2017-05-12 NOTE — Consult Note (Addendum)
NEURO HOSPITALIST CONSULT NOTE   Requestig physician: Cardiology   Reason for Consult: Dizziness   History obtained from:  Patient   and son  HPI:                                                                                                                                          [name] is an 77 y.o. female with past medical history of vertigo, sleep apnea, depression-bipolar, diabetes, hyperlipidemia, hypertension who was in the hospital for pericarditis.  Majority of the history is obtained from son as the patient cannot give me any history.  Patient's son describes the dizziness more like a foggy look on her face.  This lasted for about 5 minutes however she is able to talk through this but when talking it appears as though she has slower mentation.  He states that it occurs a couple times a day.  He denies any jerking, twitching, eye deviation, but does admit that she yawns during these episodes.  Per son patient has been on her medications for quite some time.-Medications include Zyprexa 5 mg at bedtime, Neurontin 300 mg by mouth 2 times daily, Aricept 10 mg by mouth at bedtime, carbamazepine 200 by mouth 2 times daily.   Patient was seen back in 2016 by Dr. Cyndie Chime for the same issue.  At that time she had a negative work-up including MRI of the brain, EEG.  She does have obstructive sleep apnea and is on a CPAP machine.  Looking back through the chart in both care everywhere and also in our charts I do not see where she is recently been evaluated by a sleep apnea specialist.  Of note during hospital stay patient has not had any orthostatic blood pressures obtained.  However looking through chart review she has had multiple fluctuations in her blood pressure.  Some of her blood pressures have been 80/50 and all the way up to 160/90.  Past Medical History:  Diagnosis Date  . Depression    Bipolar  . Diabetes mellitus    diet controlled  . GERD  (gastroesophageal reflux disease)   . Hyperlipidemia   . Hypertension   . Ischemic colitis, enteritis, or enterocolitis (HCC)   . Pericarditis 05/06/2017  . Sleep apnea   . Stroke (HCC)    x's 2  . Thyroid disease   . Urinary bladder incontinence   . Vertigo     Past Surgical History:  Procedure Laterality Date  . ABDOMINAL HYSTERECTOMY     partial   . APPENDECTOMY    . CARDIAC CATHETERIZATION N/A 12/24/2014   Procedure: Right/Left Heart Cath and Coronary Angiography;  Surgeon: Thurmon Fair, MD;  Location: [facility];  Service: Cardiovascular;  Laterality: N/A;  . CHOLECYSTECTOMY    .  FRACTURE SURGERY    . HEMORRHOID SURGERY    . RETINAL DETACHMENT SURGERY    . TONSILLECTOMY      Family History  Problem Relation Age of Onset  . Heart disease Mother   . Stroke Father   . Parkinson's disease Father   . Cancer Sister   . Cancer Brother               Social History:  reports that she has never smoked. She has never used smokeless tobacco. She reports that she does not drink alcohol or use drugs.  Allergies  Allergen Reactions  . Flagyl [Metronidazole Hcl] Itching and Rash  . Ciprofloxacin Itching and Rash  . Metformin And Related Rash  . Milk-Related Compounds Other (See Comments)    Stomach pains  . Other Other (See Comments)    [place] nuts cause severe facial redness    MEDICATIONS:                                                                                                                     Scheduled: . aspirin EC  81 mg Oral Daily  . carbamazepine  200 mg Oral BID  . colchicine  0.6 mg Oral BID  . diltiazem  360 mg Oral QHS  . donepezil  10 mg Oral QHS  . feeding supplement (GLUCERNA SHAKE)  237 mL Oral BID BM  . gabapentin  300 mg Oral BID  . heparin  5,000 Units Subcutaneous Q8H  . ibuprofen  800 mg Oral TID  . insulin aspart  0-5 Units Subcutaneous QHS  . insulin pump   Subcutaneous TID AC, HS, 0200  . levalbuterol  0.63 mg Nebulization  Q6H  . levothyroxine  75 mcg Oral QAC breakfast  . nitrofurantoin (macrocrystal-monohydrate)  100 mg Oral Q12H  . OLANZapine  5 mg Oral QHS  . pantoprazole  40 mg Oral Daily  . pravastatin  20 mg Oral Daily   Continuous:  ZOX:[id number], guaiFENesin-dextromethorphan, HYDROcodone-acetaminophen, hydrocortisone cream, nitroGLYCERIN, ondansetron (ZOFRAN) IV   ROS:                                                                                                                                       History obtained from Patient's son  General ROS: Positive for -  fatigue,  Psychological ROS: Positive for -  memory difficulties, Ophthalmic ROS: negative for - blurry vision, double vision,  eye pain or loss of vision ENT ROS: negative for - epistaxis, nasal discharge, oral lesions, sore throat, tinnitus or vertigo Allergy and Immunology ROS: negative for - hives or itchy/watery eyes Hematological and Lymphatic ROS: negative for - bleeding problems, bruising or swollen lymph nodes Endocrine ROS: negative for - galactorrhea, hair pattern changes, polydipsia/polyuria or temperature intolerance Respiratory ROS: negative for - cough, hemoptysis, shortness of breath or wheezing Cardiovascular ROS: negative for - chest pain, dyspnea on exertion, edema or irregular heartbeat Gastrointestinal ROS: negative for - abdominal pain, diarrhea, hematemesis, nausea/vomiting or stool incontinence Genito-Urinary ROS: negative for - dysuria, hematuria, incontinence or urinary frequency/urgency Musculoskeletal ROS: negative for - joint swelling or muscular weakness Neurological ROS: as noted in HPI Dermatological ROS: negative for rash and skin lesion changes   Blood pressure (!) 154/73, pulse 85, temperature 97.6 F (36.4 C), temperature source Oral, resp. rate 19, height 5\' 3"  (1.6 m), weight 99.8 kg (220 lb 1.6 oz), SpO2 97 %.   General Examination:                                                                                                        Physical Exam  HEENT-  Normocephalic, no lesions, without obvious abnormality.  Normal external eye and conjunctiva.   Cardiovascular-pulses palpable throughout   Lungs-no rhonchi or wheezing noted, no excessive working breathing.  Saturations within normal limits Abdomen- All 4 quadrants palpated and nontender Extremities- Warm, dry and intact Musculoskeletal-no joint tenderness, deformity or swelling Skin-warm and dry, no hyperpigmentation, vitiligo, or suspicious lesions  Neurological Examination Mental Status: Alert, oriented, thought content appropriate.  Speech fluent without evidence of aphasia.  Able to follow 3 step commands without difficulty. Cranial Nerves: II: Discs flat bilaterally; Visual fields grossly normal,  III,IV, VI: ptosis not present, extra-ocular motions intact bilaterally pupils equal, round, reactive to light and accommodation V,VII: smile symmetric, facial light touch sensation normal bilaterally VIII: hearing normal bilaterally IX,X: uvula rises symmetrically XI: bilateral shoulder shrug XII: midline tongue extension Motor: Right : Upper extremity   5/5    Left:     Upper extremity   5/5  Lower extremity   5/5     Lower extremity   5/5 Tone and bulk:normal tone throughout; no atrophy noted Sensory: Pinprick and light touch intact throughout, bilaterally Deep Tendon Reflexes: 2+ and symmetric throughout Plantars: Right: downgoing   Left: downgoing Cerebellar: normal finger-to-nose,and normal heel-to-shin test Gait: Not tested   Lab Results: Basic Metabolic Panel: Recent Labs  Lab 05/08/17 0958 05/09/17 0505 05/12/17 0816  NA 131* 132* 136  K 5.0 4.2 4.4  CL 102 103 104  CO2 17* 21* 22  GLUCOSE 250* 167* 166*  BUN 18 24* 13  CREATININE 1.12* 1.05* 0.78  CALCIUM 8.6* 8.3* 9.4    CBC: Recent Labs  Lab 05/08/17 0958 05/08/17 1647 05/09/17 0505 05/12/17 0816  WBC 14.2* 12.6* 8.6 9.0   NEUTROABS  --  9.5*  --  6.2  HGB 11.3* 10.7* 10.6* 12.0  HCT 35.4* 35.1* 34.3* 38.5  MCV 78.3 80.1  79.4 80.0  PLT 562* 516* 538* 517*    Cardiac Enzymes: No results for input(s): CKTOTAL, CKMB, CKMBINDEX, TROPONINI in the last 168 hours.  Lipid Panel: No results for input(s): CHOL, TRIG, HDL, CHOLHDL, VLDL, LDLCALC in the last 168 hours.  Imaging: Dg Chest 2 View  Result Date: 05/11/2017 CLINICAL DATA:  Shortness of breath EXAM: CHEST - 2 VIEW COMPARISON:  05/08/2017 FINDINGS: Continued moderate layering left pleural effusion. Left lower lobe atelectasis or pneumonia is stable. Right lung clear. Heart is borderline in size. No acute bony abnormality. IMPRESSION: Layering left effusion with left lower lobe atelectasis or pneumonia, unchanged. Electronically Signed   By: Charlett NoseKevin  Dover M.D.   On: 05/11/2017 12:53    Assessment and plan per attending neurologist  Felicie Mornavid Smith PA-C [facility] [phone]  05/12/2017, 11:04 AM\   NEUROHOSPITALIST ADDENDUM Seen and examined the patient today. I have reviewed the contents of history and physical exam as documented by PA/ARNP/Resident and agree with above documentation.  I have discussed and formulated the above plan as documented below. Edits to the note have been made as needed.      Assessment/Plan: This is a 77 year old female who was initially brought to the hospital secondary to pericarditis.  The hospital stay son had requested a neurological consultation as he has been noting that the patient has had multiple episodes of staring off but able to talk during these periods of time and during these times yawning, more so since last few weeks.  Given the fact patient can be responsive during episodes make these likely to be seizures. Also given recurrence and stereotypical I do not suspect TIA.  Patient does have sleep apnea and not compliant does raise suspicion if these spells are sleep attacks seen in excessive  daytime sleepiness or from hypercarbia. Patient also on many sedating medications.   Blood pressures have remained stable throughout today making this less likely.   Plan Will order EEG to evaluate this spells ( 1 spell captured on Routine EEG, awaiting report)  MRI brain  Encouraged to use CPAP   Georgiana SpinnerSushanth Vena Bassinger MD [facility] [phone]   If 7pm to 7am, please call on call as listed on AMION.

## 2017-05-12 NOTE — Progress Notes (Signed)
ANTIBIOTIC CONSULT NOTE - INITIAL  Pharmacy Consult for piperacillin/tazobactam Indication: sepsis  Allergies  Allergen Reactions  . Flagyl [Metronidazole Hcl] Itching and Rash  . Ciprofloxacin Itching and Rash  . Metformin And Related Rash  . Milk-Related Compounds Other (See Comments)    Stomach pains  . Other Other (See Comments)    [place] nuts cause severe facial redness    Patient Measurements: Height: 5\' 3"  (160 cm) Weight: 220 lb 1.6 oz (99.8 kg) IBW/kg (Calculated) : 52.4   Vital Signs: Temp: 102.7 F (39.3 C) (04/25 1649) Temp Source: Rectal (04/25 1649) BP: 158/77 (04/25 1651) Pulse Rate: 89 (04/25 1143) Intake/Output from previous day: 04/24 0701 - 04/25 0700 In: 240 [P.O.:240] Out: 750 [Urine:750] Intake/Output from this shift: No intake/output data recorded.  Labs: Recent Labs    05/12/17 0816  WBC 9.0  HGB 12.0  PLT 517*  CREATININE 0.78   Estimated Creatinine Clearance: 67.4 mL/min (by C-G formula based on SCr of 0.78 mg/dL). No results for input(s): VANCOTROUGH, VANCOPEAK, VANCORANDOM, GENTTROUGH, GENTPEAK, GENTRANDOM, TOBRATROUGH, TOBRAPEAK, TOBRARND, AMIKACINPEAK, AMIKACINTROU, AMIKACIN in the last 72 hours.   Microbiology: Recent Results (from the past 720 hour(s))  Respiratory Panel by PCR     Status: None   Collection Time: 05/06/17  9:02 AM  Result Value Ref Range Status   Adenovirus NOT DETECTED NOT DETECTED Final   Coronavirus 229E NOT DETECTED NOT DETECTED Final   Coronavirus HKU1 NOT DETECTED NOT DETECTED Final   Coronavirus NL63 NOT DETECTED NOT DETECTED Final   Coronavirus OC43 NOT DETECTED NOT DETECTED Final   Metapneumovirus NOT DETECTED NOT DETECTED Final   Rhinovirus / Enterovirus NOT DETECTED NOT DETECTED Final   Influenza A NOT DETECTED NOT DETECTED Final   Influenza B NOT DETECTED NOT DETECTED Final   Parainfluenza Virus 1 NOT DETECTED NOT DETECTED Final   Parainfluenza Virus 2 NOT DETECTED NOT DETECTED Final   Parainfluenza Virus 3 NOT DETECTED NOT DETECTED Final   Parainfluenza Virus 4 NOT DETECTED NOT DETECTED Final   Respiratory Syncytial Virus NOT DETECTED NOT DETECTED Final   Bordetella pertussis NOT DETECTED NOT DETECTED Final   Chlamydophila pneumoniae NOT DETECTED NOT DETECTED Final   Mycoplasma pneumoniae NOT DETECTED NOT DETECTED Final    Comment: Performed at [facility], [address] [place], [place] [address]    Medical History: Past Medical History:  Diagnosis Date  . Depression    Bipolar  . Diabetes mellitus    diet controlled  . GERD (gastroesophageal reflux disease)   . Hyperlipidemia   . Hypertension   . Ischemic colitis, enteritis, or enterocolitis (HCC)   . Pericarditis 05/06/2017  . Sleep apnea   . Stroke (HCC)    x's 2  . Thyroid disease   . Urinary bladder incontinence   . Vertigo      Assessment: 77 yo female being empirically treated for sepsis with zosyn (piperacillin/tazobactam). Patient currently on nitrofurantoin which could probably be d/c'd in light of broad spectrum antibiotics.   Goal of Therapy:  Resolution of infection  Plan:  Piperacillin/tazobactam 3.375gm IV q8h (EI) Monitor CBC, fever curve, cultures and sensitivities, LOT and clinical course Would recommend d/c of nitrofurantoin  Takoya Jonas A. Jeanella CrazePierce, PharmD, BCPS Clinical Pharmacist [place] Pager: [phone]  05/12/2017,5:24 PM

## 2017-05-12 NOTE — Consult Note (Signed)
Name: [name] MRN: [id number] DOB: 1940/06/29    ADMISSION DATE:  05/04/2017 CONSULTATION DATE:  4/25  REFERRING MD :  Radford Pax (cards)   CHIEF COMPLAINT:  Dyspnea, cough   BRIEF PATIENT DESCRIPTION: 77 year old female never smoker with history of CAD, diabetes, hypertension, obesity, OSA who is intermittently compliant with CPAP at home.  She presented 4/17 with shortness of breath and pleuritic chest pain.  Work-up included CT chest which was positive for large pericardial effusion and left pleural effusion.  She was admitted by cardiology with acute pericarditis.  She is improving slowly but on 4/25 remains short of breath, congested, complains of cough and PCCM was asked to evaluate.  SIGNIFICANT EVENTS    STUDIES:  Echo 4/17>>>  Findings suggest possible acute pericarditis with marked pericardial thickening. There is no evidence of tamponade, but there is subtle evidence of enhanced ventricular interdependence. Consider a component of constrictive physiology. Cardiac MRI may offer additional diagnostic information.  Cardiac MRI 4/18>>>1. Normal left ventricular size, with mild concentric hypertrophy and normal systolic function (LVEF =78). There are no regional wall motion abnormalities.  There is no late gadolinium enhancement in the left ventricular myocardium.  2. Normal right ventricular size, thickness and systolic function (LVEF =29%). There are no regional wall motion abnormalities.  3. Normal left and right atrial size.  4. Normal size of the aortic root, ascending aorta and pulmonary artery.  5. No significant valvular abnormalities.  6. There is a small amount of pericardial effusion located predominantly lateral to the left ventricle with maximum thickness 9 mm. There is no chamber collapse, IVC is not dilated.  Pericardium is severely thickened measuring up to 6 mm with significant diffuse late gadolinium enhancement consistent  with severe circumferential acute pericarditis. There is no evidence of myocarditis.  Significant epicardial fat adjacent to the right ventricle.  7. There is bilateral pleural effusion more prominent on the left.    HISTORY OF PRESENT ILLNESS:  77 year old female never smoker with history of CAD, diabetes, hypertension, obesity, OSA who is intermittently compliant with CPAP at home.  She presented 4/17 with shortness of breath and pleuritic chest pain.  Work-up included CT chest which was positive for large pericardial effusion and left pleural effusion.  She was admitted by cardiology with acute pericarditis.  She is improving slowly but on 4/25 remains short of breath, congested, complains of cough and PCCM was asked to evaluate.  States that she currently feels some better than on admission.  She has been more able to get OOB and into the chair.  She has not been wearing her CPAP here in the hospital as she does not like the mask.  She does complain of ongoing cough which is nonproductive although she does feel congested.  She does still have pleuritic chest pain.  She feels that she has been taking shallow breaths as deep inspiration worsens her pleuritic pain.  Denies fevers, chills, purulent sputum.   PAST MEDICAL HISTORY :   has a past medical history of Depression, Diabetes mellitus, GERD (gastroesophageal reflux disease), Hyperlipidemia, Hypertension, Ischemic colitis, enteritis, or enterocolitis ([facility]), Pericarditis (05/06/2017), Sleep apnea, Stroke [facility]), Thyroid disease, Urinary bladder incontinence, and Vertigo.  has a past surgical history that includes Retinal detachment surgery; Appendectomy; Tonsillectomy; Hemorrhoid surgery; Fracture surgery; Cholecystectomy; Abdominal hysterectomy; and Cardiac catheterization (N/A, 12/24/2014). Prior to Admission medications   Medication Sig Start Date End Date Taking? Authorizing Provider  aspirin 325 MG tablet Take 325 mg by mouth daily with  breakfast.    Yes [provider]  betamethasone dipropionate (DIPROLENE) 0.05 % cream Apply 1 application topically 2 (two) times daily as needed (rash).    Yes [provider]  carbamazepine (CARBATROL) 200 MG 12 hr capsule Take 200 mg by mouth 2 (two) times daily.    Yes [provider]  Cholecalciferol (VITAMIN D) 2000 UNITS tablet Take 2,000 Units by mouth daily with breakfast.    Yes [provider]  clobetasol (TEMOVATE) 0.05 % GEL Apply 1 application topically 2 (two) times daily as needed (rash).    Yes [provider]  diltiazem (CARTIA XT) 300 MG 24 hr capsule Take 300 mg by mouth at bedtime.    Yes [provider]  donepezil (ARICEPT) 10 MG tablet Take 10 mg by mouth at bedtime.    Yes [provider]  esomeprazole (NEXIUM) 40 MG capsule Take 40 mg by mouth at bedtime.    Yes [provider]  folic acid (FOLVITE) 1 MG tablet Take 1 mg by mouth daily.   Yes [provider]  gabapentin (NEURONTIN) 300 MG capsule Take 300 mg by mouth 2 (two) times daily.   Yes [provider]  HUMALOG 100 UNIT/ML injection INJECT 0.78ML (78UNITS) INTO THE SKIN VIA V-GO PUMP AS DIRECTED 11/22/16  Yes Elayne Snare, MD  Insulin Disposable Pump (V-GO 40) KIT USE ONCE DAILY AS DIRECTED Patient taking differently: USE ONCE DAILY AS DIRECTED WITH HUMALOG 03/22/17  Yes Elayne Snare, MD  INVOKANA 300 MG TABS tablet TAKE ONE TABLET BY MOUTH DAILY BEFORE BREAKFAST 04/25/17  Yes Elayne Snare, MD  levothyroxine (SYNTHROID, LEVOTHROID) 75 MCG tablet Take 75 mcg by mouth daily before breakfast.    Yes [provider]  liraglutide (VICTOZA) 18 MG/3ML SOPN Inject 0.3 mLs (1.8 mg total) into the skin daily. 02/24/17  Yes Elayne Snare, MD  methotrexate (RHEUMATREX) 2.5 MG tablet Take 7.5 mg by mouth every Monday. For rash   Yes [provider]  OLANZapine (ZYPREXA) 5 MG tablet Take 5 mg by mouth at bedtime.   Yes [provider]  ondansetron (ZOFRAN) 8 MG tablet Take 1 tablet (8 mg total) by mouth every 8 (eight) hours as needed for nausea. 05/08/12  Yes Dorie Rank, MD  pravastatin (PRAVACHOL) 20 MG tablet Take 20 mg by mouth daily.   Yes [provider]  spironolactone (ALDACTONE) 50 MG tablet Take 1 tablet (50 mg total) by mouth daily. NEED OV. 03/10/17  Yes Croitoru, Dani Gobble, MD   Allergies  Allergen Reactions  . Flagyl [Metronidazole Hcl] Itching and Rash  . Ciprofloxacin Itching and Rash  . Metformin And Related Rash  . Milk-Related Compounds Other (See Comments)    Stomach pains  . Other Other (See Comments)    [place] nuts cause severe facial redness    FAMILY HISTORY:  family history includes Cancer in her brother and sister; Heart disease in her mother; Parkinson's disease in her father; Stroke in her father. SOCIAL HISTORY:  reports that she has never smoked. She has never used smokeless tobacco. She reports that she does not drink alcohol or use drugs.  REVIEW OF SYSTEMS:   As per HPI - All other systems reviewed and were neg.    SUBJECTIVE:   VITAL SIGNS: Temp:  [97.6 F (36.4 C)-98.4 F (36.9 C)] 97.6 F (36.4 C) (04/25 0624) Pulse Rate:  [85-108] 85 (04/25 1047) Resp:  [19] 19 (04/24 1129) BP: (132-160)/(69-73) 154/73 (04/25 0624) SpO2:  [90 %-97 %]  97 % (04/25 1047) Weight:  [99.8 kg (220 lb 1.6 oz)] 99.8 kg (220 lb 1.6 oz) (04/25 0624)  PHYSICAL EXAMINATION: General: Pleasant, obese female, no acute distress in bed Neuro: Awake, alert, appropriate HEENT: Mucous membranes moist, no JVD Cardiovascular: S1-S2 regular rate and rhythm Lungs: Respirations are even and nonlabored on room air, diminished throughout, few scattered crackles Abdomen: Obese, soft, nontender Musculoskeletal: Warm and dry, 1+ BLE edema  Recent Labs  Lab 05/08/17 0958 05/09/17 0505 05/12/17 0816  NA 131* 132* 136  K 5.0 4.2 4.4  CL 102 103 104  CO2 17* 21* 22  BUN 18 24* 13    CREATININE 1.12* 1.05* 0.78  GLUCOSE 250* 167* 166*   Recent Labs  Lab 05/08/17 1647 05/09/17 0505 05/12/17 0816  HGB 10.7* 10.6* 12.0  HCT 35.1* 34.3* 38.5  WBC 12.6* 8.6 9.0  PLT 516* 538* 517*   Dg Chest 2 View  Result Date: 05/11/2017 CLINICAL DATA:  Shortness of breath EXAM: CHEST - 2 VIEW COMPARISON:  05/08/2017 FINDINGS: Continued moderate layering left pleural effusion. Left lower lobe atelectasis or pneumonia is stable. Right lung clear. Heart is borderline in size. No acute bony abnormality. IMPRESSION: Layering left effusion with left lower lobe atelectasis or pneumonia, unchanged. Electronically Signed   By: Rolm Baptise M.D.   On: 05/11/2017 12:53    ASSESSMENT / PLAN:  Dyspnea -likely multifactorial in setting of pleural effusion/atelectasis related to acute pericarditis with pleuritic chest pain and splinting.  Likely complicated by mild decompensated OSA.  No significant change in effusion since last x-ray.  Unlikely pneumonia.  Seems to be much improved over the last few days according to patient and her son.  Plan- No acute indication for thoracentesis Encouraged patient to wear her CPAP-son bringing mask from home Aggressive pulmonary hygiene-added incentive and flutter valve Mobilize as much as able Continue bronchodilators Consider gentle diuresis Mucolytic's Cough suppression as needed Intermittent follow-up chest x-ray   Nickolas Madrid, NP 05/12/2017  11:14 AM Pager: [phone] or [phone]

## 2017-05-12 NOTE — Progress Notes (Signed)
Progress Note  Patient Name: [name] Date of Encounter: 05/12/2017  Primary Cardiologist: No primary care provider on file.   Subjective   Still complains of pleuritic CP feels much better today from a dizzy standpoint.  She still has a pretty significant cough.  Inpatient Medications    Scheduled Meds: . aspirin EC  81 mg Oral Daily  . carbamazepine  200 mg Oral BID  . colchicine  0.6 mg Oral BID  . diltiazem  300 mg Oral QHS  . donepezil  10 mg Oral QHS  . feeding supplement (GLUCERNA SHAKE)  237 mL Oral BID BM  . gabapentin  300 mg Oral BID  . heparin  5,000 Units Subcutaneous Q8H  . ibuprofen  800 mg Oral TID  . insulin aspart  0-5 Units Subcutaneous QHS  . insulin pump   Subcutaneous TID AC, HS, 0200  . levalbuterol  0.63 mg Nebulization Q6H  . levothyroxine  75 mcg Oral QAC breakfast  . nitrofurantoin (macrocrystal-monohydrate)  100 mg Oral Q12H  . OLANZapine  5 mg Oral QHS  . pantoprazole  40 mg Oral Daily  . pravastatin  20 mg Oral Daily   Continuous Infusions:  PRN Meds: acetaminophen, guaiFENesin-dextromethorphan, HYDROcodone-acetaminophen, hydrocortisone cream, nitroGLYCERIN, ondansetron (ZOFRAN) IV   Vital Signs    Vitals:   05/11/17 2128 05/11/17 2239 05/12/17 0048 05/12/17 0624  BP: (!) 160/69 (!) 147/71  (!) 154/73  Pulse: 94  95 90  Resp:      Temp: 98.3 F (36.8 C)   97.6 F (36.4 C)  TempSrc: Oral   Oral  SpO2: 93%  91% 95%  Weight:    220 lb 1.6 oz (99.8 kg)  Height:        Intake/Output Summary (Last 24 hours) at 05/12/2017 0744 Last data filed at 05/12/2017 8638 Gross per 24 hour  Intake 240 ml  Output 750 ml  Net -510 ml   Filed Weights   05/10/17 0619 05/11/17 0615 05/12/17 0624  Weight: 220 lb 14.4 oz (100.2 kg) 221 lb 4.8 oz (100.4 kg) 220 lb 1.6 oz (99.8 kg)    Telemetry    NSR - Personally Reviewed  ECG    No new EKG to review- Personally Reviewed  Physical Exam   GEN: No acute distress.   Neck: No  JVD Cardiac: RRR, no murmurs, rubs, or gallops.  Respiratory:  Decreased breath sounds at the right base with rhonchi GI: Soft, nontender, non-distended  MS: No edema; No deformity. Neuro:  Nonfocal  Psych: Normal affect   Labs    Chemistry Recent Labs  Lab 05/08/17 0958 05/09/17 0505  NA 131* 132*  K 5.0 4.2  CL 102 103  CO2 17* 21*  GLUCOSE 250* 167*  BUN 18 24*  CREATININE 1.12* 1.05*  CALCIUM 8.6* 8.3*  GFRNONAA 46* 50*  GFRAA 54* 58*  ANIONGAP 12 8     Hematology Recent Labs  Lab 05/08/17 0958 05/08/17 1647 05/09/17 0505  WBC 14.2* 12.6* 8.6  RBC 4.52 4.38 4.32  HGB 11.3* 10.7* 10.6*  HCT 35.4* 35.1* 34.3*  MCV 78.3 80.1 79.4  MCH 25.0* 24.4* 24.5*  MCHC 31.9 30.5 30.9  RDW 16.8* 16.5* 16.7*  PLT 562* 516* 538*    Cardiac EnzymesNo results for input(s): TROPONINI in the last 168 hours. No results for input(s): TROPIPOC in the last 168 hours.   BNPNo results for input(s): BNP, PROBNP in the last 168 hours.   DDimer No results for input(s):  DDIMER in the last 168 hours.   Radiology    Dg Chest 2 View  Result Date: 05/11/2017 CLINICAL DATA:  Shortness of breath EXAM: CHEST - 2 VIEW COMPARISON:  05/08/2017 FINDINGS: Continued moderate layering left pleural effusion. Left lower lobe atelectasis or pneumonia is stable. Right lung clear. Heart is borderline in size. No acute bony abnormality. IMPRESSION: Layering left effusion with left lower lobe atelectasis or pneumonia, unchanged. Electronically Signed   By: Rolm Baptise M.D.   On: 05/11/2017 12:53    Cardiac Studies   Echocardiogram 05/04/2017: Study Conclusions  - Left ventricle: The cavity size was normal. Systolic function was normal. The estimated ejection fraction was in the range of 60% to 65%. Wall motion was normal; there were no regional wall motion abnormalities. Doppler parameters are consistent with abnormal left ventricular relaxation (grade 1 diastolic dysfunction). -  Pericardium, extracardiac: There was moderate thickening. A moderate pericardial effusion was identified circumferential to the heart. There was no evidence of hemodynamic compromise. There was no chamber collapse. There was evidence for mildly increased RV-LV interaction demonstrated by respirophasic changes in transmitral velocities. Features were not consistent with tamponade physiology.  Impressions:  - Findings suggest possible acute pericarditis with marked pericardial thickening. There is no evidence of tamponade, but there is subtle evidence of enhanced ventricular interdependence. Consider a component of constrictive physiology. Cardiac MRI may offer additional diagnostic information.  Cardiac MRI 05/05/2017  IMPRESSION: 1. Normal left ventricular size, with mild concentric hypertrophy and normal systolic function (LVEF =15). There are no regional wall motion abnormalities.  There is no late gadolinium enhancement in the left ventricular myocardium.  2. Normal right ventricular size, thickness and systolic function (LVEF =40%). There are no regional wall motion abnormalities.  3. Normal left and right atrial size.  4. Normal size of the aortic root, ascending aorta and pulmonary artery.  5. No significant valvular abnormalities.  6. There is a small amount of pericardial effusion located predominantly lateral to the left ventricle with maximum thickness 9 mm. There is no chamber collapse, IVC is not dilated.  Pericardium is severely thickened measuring up to 6 mm with significant diffuse late gadolinium enhancement consistent with severe circumferential acute pericarditis. There is no evidence of myocarditis.  Significant epicardial fat adjacent to the right ventricle.  7. There is bilateral pleural effusion more prominent on the left.  2D echo limited 05/09/2017 Study Conclusions  - Left ventricle: The cavity size was  normal. Wall thickness was normal. Systolic function was vigorous. The estimated ejection fraction was in the range of 65% to 70%. Wall motion was normal; there were no regional wall motion abnormalities. - Pericardium, extracardiac: A moderate pericardial effusion was identified. There was no evidence of hemodynamic compromise.  Patient Profile     77 y.o. female a history of type 2 diabetes mellitus, previous TIA, hypertension, hypothyroidism, and obesity, now presenting with acute pericarditis associated with effusion and significant pericardial thickening. Evaluation has included echocardiogram and cardiac MRI as noted above.  Assessment & Plan    1.Acute pericarditiswith pericardial effusion.  -Viral respiratory screen negative and ANA negative.  -CRP 17 and ESR 64.  -Thyroid studies normal.  -Troponin I negative and cardiac MRI shows no clear evidence of myocarditis.She does have severe pericardial thickening (up to 6 mm by cardiac MRI). -Still complains of pleuritic chest pain -I do not hear a rub on exam today -2D echocardiogram 4/22 showed no increase in size of pericardial effusion and no evidence of Tamponade -  Continue on colchicine 0.73m BID and motrin 8098mTID - Continue PPI while on NSAIDs  2.Essential hypertension.  -BP continues to be elevated ranging from 166/51 169/79 mmHg. -She received Cardizem CD 300 mg last night blood pressure still remains elevated -Increase Cardizem CD to 360 mg starting tonight  3.Hyperlipidemia -continue Pravastatin -LDL is at goal at 73  4. DM - continue Insulin pump - BS for the most part have been controlled and this a.m. is 164  5.Deconditioning -PT is working with patient -She will need skilled nursing facility upon discharge  6.UTI-cultures are pending -Started on Macrobid 100 mg twice daily for 5 days- Day #3  7.Rash-denies any itching on her back today  8.  Dizziness -She  has a history of vertigo in the past and physical therapy is going to work with her today -Her son is very concerned that her symptoms of numbness are not like her typical vertigo.  It is hard to determine whether it is just due to overall deconditioning and she is feeling lightheaded when she gets up and moves around. -BP has been normal during her episodes of dizziness -Her son is also concerned that she intermittently is having episodes where she compares completely blanked out -Neuro consult is pending  9.  Cough with chest congestion -She is having some wheezing on exam and when she coughs her chest sounds congested -Chest x-ray yesterday showed layering left effusion and left lower lobe atelectasis or pneumonia -She is on Xopenex nebulizer treatments were started yesterday for wheezing -Encouraged her to be up out of bed in the chair -I will ask pulmonary to see today -Repeat CBC    For questions or updates, please contact [facility] consult [email or web address] for contact info under Cardiology/STEMI.      Signed, TrFransico HimMD  05/12/2017, 7:44 AM

## 2017-05-12 NOTE — Progress Notes (Signed)
Nutrition Follow-up  DOCUMENTATION CODES:   Obesity unspecified  INTERVENTION:   - Glucerna Shake po daily as PO intake has improved, each supplement provides 220 kcal and 10 grams of protein  NUTRITION DIAGNOSIS:   Inadequate oral intake related to poor appetite as evidenced by meal completion < 50%, per patient/family report.  Improving  GOAL:   Patient will meet greater than or equal to 90% of their needs  Progressing  MONITOR:   PO intake, Supplement acceptance  REASON FOR ASSESSMENT:   Malnutrition Screening Tool    ASSESSMENT:   77 yo female with PMH of thyroid DZ, HTN, CAD, obesity, ischemic colitis, sleep apnea, GERD, HLD, DM, stroke, and Bipolar depression who was admitted on 4/17 with large pericardial effusion.  Spoke with pt and son at bedside. Pt reports that her appetite is greatly improved. Pt enjoys having Glucerna in the evenings prior to bed and would like to receive once daily as she is usually unable to completely finish 2 supplements. Pt prefers chocolate flavor.  RD noted lunch meal tray in room at time of visit. Pt completed 100% of Lactaid milk, 50% of Malawiturkey with cranberry sauce, and 25% of rice and broccoli.  Pt is saving some of meal food items for snack in between meals including fruit, cookies, and jello.  Meal Completion: 50-100% since 05/09/17  Medications reviewed and include: sliding scale Novolog, insulin pump, 75 mcg levothyroxine, 40 mg Protonix, 20 mg Pravachol  Labs reviewed. CBG's: 140, 164, 161, 119, 138 x 24 hours  Diet Order:  Diet heart healthy/carb modified Room service appropriate? Yes; Fluid consistency: Thin  EDUCATION NEEDS:   No education needs have been identified at this time  Skin:  Skin Assessment: Reviewed RN Assessment  Last BM:  05/12/17 medium type 6  Height:   Ht Readings from Last 1 Encounters:  05/04/17 5\' 3"  (1.6 m)    Weight:   Wt Readings from Last 1 Encounters:  05/12/17 220 lb 1.6 oz  (99.8 kg)    Ideal Body Weight:  52.3 kg  BMI:  Body mass index is 38.99 kg/m.  Estimated Nutritional Needs:   Kcal:  1550-1750  Protein:  90-105 gm  Fluid:  1.8 L    Mary ReadingKate Jablonski [name], MS, RD, LDN Pager: [phone] Weekend/After Hours: [phone]

## 2017-05-12 NOTE — Progress Notes (Addendum)
EEG report available for review: Description of the recording At times posterior rhythm is 5 Hz with reactivity symmetrical Very frequent sometimes continuous generalized rhythmic delta activity. Sleep architecture was not seen. Impression: Abormal EEG showing very frequent and sometimes continuous generalized rhythmic delta activity these findings are nonspecific and can suggest generalized moderate cerebral dysfunction and sometimes in the right clinical context may suggest seizure-like pattern. Consider continuous EEG monitoring.  Assessment/Recommendations: Episodes of staring with retained ability to speak and possible stereotyped yawning 1. EEG shows GRDA which most likely is secondary to a diffuse encephalopathy in the context of the patient's infection. The pattern is consistent throughout the 20 minute record; although this decreases the probability of seizure as the underlying etiology, seizure remains on the DDx 2. Will obtain LTM EEG.  If pattern shows evolution c/w seizure will load with Keppra. If persistent pattern, will also load with Keppra to determine if the GRDA improves - if it improves then epileptic etiology most likely. If it does not improve then most c/w encephalopathy.   3. Discussed with Dr. Desmond Lopeurk  Electronically signed: Dr. Caryl PinaEric Emmory Solivan

## 2017-05-13 ENCOUNTER — Inpatient Hospital Stay (HOSPITAL_COMMUNITY): Payer: Medicare Other

## 2017-05-13 DIAGNOSIS — R569 Unspecified convulsions: Secondary | ICD-10-CM

## 2017-05-13 DIAGNOSIS — J90 Pleural effusion, not elsewhere classified: Secondary | ICD-10-CM

## 2017-05-13 HISTORY — PX: IR THORACENTESIS RIGHT ASP PLEURAL SPACE W/IMG GUIDE: IMG5380

## 2017-05-13 LAB — C DIFFICILE QUICK SCREEN W PCR REFLEX
C DIFFICILE (CDIFF) INTERP: NOT DETECTED
C DIFFICILE (CDIFF) TOXIN: NEGATIVE
C DIFFICLE (CDIFF) ANTIGEN: NEGATIVE

## 2017-05-13 LAB — GLUCOSE, CAPILLARY
GLUCOSE-CAPILLARY: 92 mg/dL (ref 65–99)
GLUCOSE-CAPILLARY: 131 mg/dL — AB (ref 65–99)
Glucose-Capillary: 146 mg/dL — ABNORMAL HIGH (ref 65–99)
Glucose-Capillary: 64 mg/dL — ABNORMAL LOW (ref 65–99)
Glucose-Capillary: 68 mg/dL (ref 65–99)

## 2017-05-13 LAB — BODY FLUID CELL COUNT WITH DIFFERENTIAL
EOS FL: 1 %
Lymphs, Fluid: 53 %
MONOCYTE-MACROPHAGE-SEROUS FLUID: 40 % — AB (ref 50–90)
NEUTROPHIL FLUID: 6 % (ref 0–25)
WBC FLUID: 415 uL (ref 0–1000)

## 2017-05-13 LAB — LACTATE DEHYDROGENASE, PLEURAL OR PERITONEAL FLUID: LD, Fluid: 65 U/L — ABNORMAL HIGH (ref 3–23)

## 2017-05-13 LAB — PROTEIN, PLEURAL OR PERITONEAL FLUID: TOTAL PROTEIN, FLUID: 3.6 g/dL

## 2017-05-13 LAB — URINE CULTURE

## 2017-05-13 LAB — GLUCOSE, PLEURAL OR PERITONEAL FLUID: Glucose, Fluid: 125 mg/dL

## 2017-05-13 MED ORDER — IPRATROPIUM-ALBUTEROL 0.5-2.5 (3) MG/3ML IN SOLN: 3.0000 mL | RESPIRATORY_TRACT | Status: DC | PRN

## 2017-05-13 MED ORDER — COLCHICINE 0.6 MG PO TABS
0.6000 mg | ORAL_TABLET | Freq: Two times a day (BID) | ORAL | Status: DC
  Administered 2017-05-13 – 2017-05-17 (×9): 0.6 mg via ORAL
  Filled 2017-05-13 (×9): qty 1

## 2017-05-13 MED ORDER — IPRATROPIUM-ALBUTEROL 0.5-2.5 (3) MG/3ML IN SOLN
3.0000 mL | Freq: Two times a day (BID) | RESPIRATORY_TRACT | Status: DC
  Administered 2017-05-13 – 2017-05-15 (×5): 3 mL via RESPIRATORY_TRACT
  Filled 2017-05-13 (×6): qty 3

## 2017-05-13 MED ORDER — LIDOCAINE HCL (PF) 2 % IJ SOLN
INTRAMUSCULAR | Status: DC | PRN
  Administered 2017-05-13: 10 mL

## 2017-05-13 MED ORDER — LEVETIRACETAM IN NACL 500 MG/100ML IV SOLN
500.0000 mg | Freq: Two times a day (BID) | INTRAVENOUS | Status: DC
  Filled 2017-05-13: qty 100

## 2017-05-13 MED ORDER — LEVETIRACETAM IN NACL 1000 MG/100ML IV SOLN
1000.0000 mg | Freq: Once | INTRAVENOUS | Status: AC
  Administered 2017-05-13: 1000 mg via INTRAVENOUS
  Filled 2017-05-13: qty 100

## 2017-05-13 MED ORDER — LIDOCAINE HCL (PF) 2 % IJ SOLN
INTRAMUSCULAR | Status: AC
  Filled 2017-05-13: qty 20

## 2017-05-13 MED ORDER — LIDOCAINE HCL 1 % IJ SOLN
INTRAMUSCULAR | Status: AC
  Filled 2017-05-13: qty 20

## 2017-05-13 NOTE — Progress Notes (Signed)
CSW continuing to follow for disposition planning. Noted patient not medically ready. CSW to follow and [facility].   Abigail ButtsSusan Shasha Buchbinder, LCSWA [phone]

## 2017-05-13 NOTE — Progress Notes (Signed)
EEG Maint complete. No skin breakdown continue to monitor 

## 2017-05-13 NOTE — Progress Notes (Signed)
Name: [name] MRN: [id number] DOB: 1940-07-12    ADMISSION DATE:  05/04/2017 CONSULTATION DATE:  4/25  REFERRING MD :  Mayford Knife (cards)   CHIEF COMPLAINT:  Dyspnea, cough   BRIEF PATIENT DESCRIPTION: 77 year old female never smoker with history of CAD, diabetes, hypertension, obesity, OSA who is intermittently compliant with CPAP at home.  She presented 4/17 with shortness of breath and pleuritic chest pain.  Work-up included CT chest which was positive for large pericardial effusion and left pleural effusion.  She was admitted by cardiology with acute pericarditis.  She is improving slowly but on 4/25 remains short of breath, congested, complains of cough and PCCM was asked to evaluate.  SIGNIFICANT EVENTS    STUDIES:  Echo 4/17>>>  Findings suggest possible acute pericarditis with marked pericardial thickening. There is no evidence of tamponade, but there is subtle evidence of enhanced ventricular interdependence. Consider a component of constrictive physiology. Cardiac MRI may offer additional diagnostic information.  Cardiac MRI 4/18>>>1. Normal left ventricular size, with mild concentric hypertrophy and normal systolic function (LVEF =66). There are no regional wall motion abnormalities.  There is no late gadolinium enhancement in the left ventricular myocardium.  2. Normal right ventricular size, thickness and systolic function (LVEF =68%). There are no regional wall motion abnormalities.  3. Normal left and right atrial size.  4. Normal size of the aortic root, ascending aorta and pulmonary artery.  5. No significant valvular abnormalities.  6. There is a small amount of pericardial effusion located predominantly lateral to the left ventricle with maximum thickness 9 mm. There is no chamber collapse, IVC is not dilated.  Pericardium is severely thickened measuring up to 6 mm with significant diffuse late gadolinium enhancement consistent  with severe circumferential acute pericarditis. There is no evidence of myocarditis.  Significant epicardial fat adjacent to the right ventricle.  7. There is bilateral pleural effusion more prominent on the left.    HISTORY OF PRESENT ILLNESS:  77 year old female never smoker with history of CAD, diabetes, hypertension, obesity, OSA who is intermittently compliant with CPAP at home.  She presented 4/17 with shortness of breath and pleuritic chest pain.  Work-up included CT chest which was positive for large pericardial effusion and left pleural effusion.  She was admitted by cardiology with acute pericarditis.  She is improving slowly but on 4/25 remains short of breath, congested, complains of cough and PCCM was asked to evaluate.  States that she currently feels some better than on admission.  She has been more able to get OOB and into the chair.  She has not been wearing her CPAP here in the hospital as she does not like the mask.  She does complain of ongoing cough which is nonproductive although she does feel congested.  She does still have pleuritic chest pain.  She feels that she has been taking shallow breaths as deep inspiration worsens her pleuritic pain.  Denies fevers, chills, purulent sputum. Notes she has home CPAP but rarely wears it throughout the night.  There is a large component of noncompliance here.      SUBJECTIVE:  Obese female BMI 39 is resting in the bed no acute distress VITAL SIGNS: Temp:  [97.5 F (36.4 C)-102.7 F (39.3 C)] 98 F (36.7 C) (04/26 0859) Pulse Rate:  [89-107] 102 (04/26 0800) Resp:  [16-20] 16 (04/26 0500) BP: (115-175)/(51-80) 141/71 (04/26 0859) SpO2:  [91 %-100 %] 94 % (04/26 0859)  PHYSICAL EXAMINATION: General: Awake alert no acute distress at  rest HEENT: Throat neck no lymphadenopathy or JVD is appreciated PSY: Dull effect Neuro: Intact CV: Currently in sinus rhythm sinus tachycardia with a rate of 102  PULM: Shallow  respirations, breath sounds in the base acute distress at rest JX:BJYNGI:soft, non-tender, bsx4 active  Extremities: warm/dry, 1+ edema  Skin: no rashes or lesions   Recent Labs  Lab 05/08/17 0958 05/09/17 0505 05/12/17 0816  NA 131* 132* 136  K 5.0 4.2 4.4  CL 102 103 104  CO2 17* 21* 22  BUN 18 24* 13  CREATININE 1.12* 1.05* 0.78  GLUCOSE 250* 167* 166*   Recent Labs  Lab 05/08/17 1647 05/09/17 0505 05/12/17 0816  HGB 10.7* 10.6* 12.0  HCT 35.1* 34.3* 38.5  WBC 12.6* 8.6 9.0  PLT 516* 538* 517*   Dg Chest Port 1 View  Result Date: 05/13/2017 CLINICAL DATA:  Pleural effusion EXAM: PORTABLE CHEST 1 VIEW COMPARISON:  May 11, 2017 FINDINGS: There is a fairly small left pleural effusion with patchy consolidation in the left base. Lungs elsewhere clear. Heart is mildly enlarged with pulmonary vascularity within normal limits. No adenopathy. No bone lesions. IMPRESSION: Fairly small left pleural effusion with left base consolidation. Lungs elsewhere clear. Stable cardiac prominence. Electronically Signed   By: Bretta BangWilliam  Woodruff III M.D.   On: 05/13/2017 09:52    Intake/Output Summary (Last 24 hours) at 05/13/2017 1054 Last data filed at 05/13/2017 1019 Gross per 24 hour  Intake 510 ml  Output 800 ml  Net -290 ml      ASSESSMENT / PLAN:  Dyspnea -likely multifactorial in setting of pleural effusion/atelectasis related to acute pericarditis with pleuritic chest pain and splinting.  Likely complicated by mild decompensated OSA.  No significant change in effusion since last x-ray.  Unlikely pneumonia.  Seems to be much improved over the last few days according to patient and her son.  Plan- During the night of 05/12/2017 she had fever and was started on IV Zosyn.  She had blood culture and urine cultures ordered.  Unable to do sputum culture due to lack of sputum production.  Noted to have a small pleural effusion repeat chest x-ray on 05/13/2017 shows again a very small pleural  effusion if tap most likely should be done under IR guidance.  Continue diuresis as tolerated note negative I/o over last 24-hour  Bronchodilator  Mobilize  Pieces him available as needed call if needed thank you   Brett CanalesSteve Minor ACNP [facility] Pager [phone] till 1 pm If no answer page [phone] 05/13/2017, 11:04 AM           Brett CanalesSteve Minor ACNP [facility] Pager [phone] till 1 pm If no answer page [phone] 05/13/2017, 10:48 AM

## 2017-05-13 NOTE — Progress Notes (Signed)
LTM EEG initiated.  Dr. Lindzen notified. 

## 2017-05-13 NOTE — Progress Notes (Signed)
PT Cancellation Note  Patient Details Name: Clayborn BignessShirley H Esterly MRN: [id number] DOB: 05/30/1940   Cancelled Treatment:    Reason Eval/Treat Not Completed: Patient at procedure or test/unavailable   Angelina OkCary W [facility] 05/13/2017, 3:41 PM [facility] Dann PT [phone]

## 2017-05-13 NOTE — Progress Notes (Addendum)
Subjective: Per son patient continues to have spells.  He states that the spells are much worse than usual.  They have lasted longer and there also more in-depth such as she has more of a foggy sense and is much slower in her thought processes.  Initial spot EEG showed frequent and sometimes continues generalized rhythmic delta activity was a question that this may suggest seizure-like pattern.  For this reason patient was hooked up to LTM overnight.  Son states that she had multiple overnight events and multiple push button events.  Exam: Vitals:   05/13/17 0800 05/13/17 0859  BP:  (!) 141/71  Pulse: (!) 102   Resp:    Temp:  98 F (36.7 C)  SpO2: 91% 94%    Physical Exam   HEENT-  Normocephalic, no lesions, without obvious abnormality.  Normal external eye and conjunctiva.   Cardiovascular- S1-S2 audible, pulses palpable throughout   Lungs-no rhonchi or wheezing noted, no excessive working breathing.  Saturations within normal limits Abdomen- All 4 quadrants palpated and nontender Extremities- Warm, dry and intact Musculoskeletal-no joint tenderness, deformity or swelling Skin-warm and dry, no hyperpigmentation, vitiligo, or suspicious lesions    Neuro:  Mental Status: Alert, oriented, thought content slow.  Speech fluent without evidence of aphasia.  Able to follow simple step commands without difficulty--but his bradykinetic when she does so Cranial Nerves: II:  Visual fields grossly normal,  III,IV, VI: ptosis not present, extra-ocular motions intact bilaterally pupils equal, round, reactive to light and accommodation V,VII: smile symmetric, facial light touch sensation normal bilaterally VIII: hearing normal bilaterally IX,X: uvula rises symmetrically XI: bilateral shoulder shrug XII: midline tongue extension Motor: Right : Upper extremity   5/5    Left:     Upper extremity   5/5  Lower extremity   5/5     Lower extremity   5/5 Tone and bulk:normal tone throughout; no  atrophy noted Sensory: Pinprick and light touch intact throughout, bilaterally Deep Tendon Reflexes: 2+ and symmetric throughout Plantars: Right: downgoing   Left: downgoing Cerebellar: normal finger-to-nose, normal rapid alternating movements and normal heel-to-shin test     Medications:  Scheduled: . aspirin EC  81 mg Oral Daily  . carbamazepine  200 mg Oral BID  . colchicine  0.6 mg Oral BID  . diltiazem  360 mg Oral QHS  . donepezil  10 mg Oral QHS  . feeding supplement (GLUCERNA SHAKE)  237 mL Oral Q24H  . gabapentin  300 mg Oral BID  . heparin  5,000 Units Subcutaneous Q8H  . ibuprofen  800 mg Oral TID  . insulin aspart  0-5 Units Subcutaneous QHS  . insulin pump   Subcutaneous TID AC, HS, 0200  . ipratropium-albuterol  3 mL Nebulization BID  . levothyroxine  75 mcg Oral QAC breakfast  . montelukast  5 mg Oral QHS  . nitrofurantoin (macrocrystal-monohydrate)  100 mg Oral Q12H  . OLANZapine  5 mg Oral QHS  . pantoprazole  40 mg Oral Daily  . pravastatin  20 mg Oral Daily   Continuous: . levETIRAcetam    . piperacillin-tazobactam (ZOSYN)  IV 3.375 g (05/13/17 1019)   UJW:JXBJYNWGNFAOZ, guaiFENesin-dextromethorphan, HYDROcodone-acetaminophen, hydrocortisone cream, ipratropium-albuterol, loperamide, nitroGLYCERIN, ondansetron (ZOFRAN) IV  Pertinent Labs/Diagnostics: EEG: LTM DAY #1: from 0331 to 0731 05/13/17  BACKGROUND: An overall medium voltage continuous recording with good spontaneous variability and reactivity. Waking background consisted of medium voltage theta-delta activity bilaterally and an intermittent 7-8Hz  posterior dominant rhythm. Some sparse superimposed low voltage beta  activity was present in the bilateral frontocentral regions. Sleep was captured, however no clear stage II sleep architecture was seen. There was machine artifact from CPAP during portions of the record. EPILEPTIFORM/PERIODIC ACTIVITY: There were occasional bursts and short runs (up to 10-15  seconds) of bifrontal maximal intermittent rhythmic delta activity (GRDA) with frequency 1-2Hz  without ictal evolution or associated clinical signs. This pattern did not have a particular epileptiform appearance.  SEIZURES: none EVENTS: none  LTM is still hooked up and awaiting final reading for today and son stated that he did have a few pushbutton events this morning.   Dg Chest Port 1 View  Result Date: 05/13/2017 CLINICAL DATA:  Pleural effusion EXAM: PORTABLE CHEST 1 VIEW COMPARISON:  May 11, 2017 FINDINGS: There is a fairly small left pleural effusion with patchy consolidation in the left base. Lungs elsewhere clear. Heart is mildly enlarged with pulmonary vascularity within normal limits. No adenopathy. No bone lesions. IMPRESSION: Fairly small left pleural effusion with left base consolidation. Lungs elsewhere clear. Stable cardiac prominence. Electronically Signed   By: Bretta BangWilliam  Woodruff III M.D.   On: 05/13/2017 09:52     Felicie MornDavid Smith PA-C [facility] [phone]  Impression: Episodes of staring with retained ability to speak however very slow thought process, and yawning.  Initial EEG showed slowing concerning for possible seizure.   For this reason patient was loaded with Keppra 1 g IV and then continued with 500 mg IV and placed on continuous LTM.   Initial reading of LTM showed no epileptiform activities.   Recommendations: --LTM captured event, negative for seizure. Background also showed no epileptiform activity. --Discontinue Keppra  --Suspect her symptoms maybe due to  excessive sleepiness during the daytime, metabolic and excessive sedating medications.      NEUROHOSPITALIST ADDENDUM Seen and examined the patient today. I have reviewed the contents of history and physical exam as documented by PA/ARNP/Resident and agree with above documentation.  I have discussed and formulated the above plan as documented. Edits to the note have been made as needed.     Georgiana SpinnerSushanth Delma Villalva MD [facility] [phone]   If 7pm to 7am, please call on call as listed on AMION.

## 2017-05-13 NOTE — Progress Notes (Signed)
Progress Note  Patient Name: [name] Date of Encounter: 05/13/2017  Primary Cardiologist: Sanda Klein, MD   Subjective   The son who is been with the patient this week and told me that she was having diarrhea and was concerned about her getting the bathroom has now changed his story a little bit and states that her stools are the same as they always are at home and are a little bit loose and there is been no change in the way her stools.  He is just concerned about her being able to get to the bathroom in a timely fashion without the risk of falling.  She spiked a temperature to 102 yesterday pulmonary has started on broad-spectrum antibiotics.  Repeat UA showed minimal white blood cells.  She has no dysuria.  Her chest pain is significantly improved. Inpatient Medications    Scheduled Meds: . aspirin EC  81 mg Oral Daily  . carbamazepine  200 mg Oral BID  . diltiazem  360 mg Oral QHS  . donepezil  10 mg Oral QHS  . feeding supplement (GLUCERNA SHAKE)  237 mL Oral Q24H  . gabapentin  300 mg Oral BID  . heparin  5,000 Units Subcutaneous Q8H  . ibuprofen  800 mg Oral TID  . insulin aspart  0-5 Units Subcutaneous QHS  . insulin pump   Subcutaneous TID AC, HS, 0200  . ipratropium-albuterol  3 mL Nebulization BID  . levothyroxine  75 mcg Oral QAC breakfast  . montelukast  5 mg Oral QHS  . nitrofurantoin (macrocrystal-monohydrate)  100 mg Oral Q12H  . OLANZapine  5 mg Oral QHS  . pantoprazole  40 mg Oral Daily  . pravastatin  20 mg Oral Daily   Continuous Infusions: . levETIRAcetam    . levETIRAcetam    . piperacillin-tazobactam (ZOSYN)  IV 3.375 g (05/13/17 0258)   PRN Meds: acetaminophen, guaiFENesin-dextromethorphan, HYDROcodone-acetaminophen, hydrocortisone cream, ipratropium-albuterol, loperamide, nitroGLYCERIN, ondansetron (ZOFRAN) IV   Vital Signs    Vitals:   05/12/17 2058 05/12/17 2147 05/13/17 0002 05/13/17 0500  BP:  (!) 155/51  (!) 175/65  Pulse:     98  Resp:   20 16  Temp:   98.2 F (36.8 C) 97.9 F (36.6 C)  TempSrc:   Oral Oral  SpO2: 100%  96% 100%  Weight:      Height:        Intake/Output Summary (Last 24 hours) at 05/13/2017 0719 Last data filed at 05/13/2017 3536 Gross per 24 hour  Intake 360 ml  Output -  Net 360 ml   Filed Weights   05/10/17 0619 05/11/17 0615 05/12/17 0624  Weight: 220 lb 14.4 oz (100.2 kg) 221 lb 4.8 oz (100.4 kg) 220 lb 1.6 oz (99.8 kg)    Telemetry    NSR - Personally Reviewed  ECG    No new EKG to review- Personally Reviewed  Physical Exam   GEN: No acute distress.   Neck: No JVD Cardiac: RRR, no murmurs, rubs, or gallops.  Respiratory: Clear to auscultation bilaterally anteriorly. GI: Soft, nontender, non-distended  MS: No edema; No deformity. Neuro:  Nonfocal  Psych: Normal affect   Labs    Chemistry Recent Labs  Lab 05/08/17 0958 05/09/17 0505 05/12/17 0816  NA 131* 132* 136  K 5.0 4.2 4.4  CL 102 103 104  CO2 17* 21* 22  GLUCOSE 250* 167* 166*  BUN 18 24* 13  CREATININE 1.12* 1.05* 0.78  CALCIUM 8.6* 8.3* 9.4  GFRNONAA 46* 50* >60  GFRAA 54* 58* >60  ANIONGAP _0 Hematology Recent Labs  Lab 05/08/17 1647 05/09/17 0505 05/12/17 0816  WBC 12.6* 8.6 9.0  RBC 4.38 4.32 4.81  HGB 10.7* 10.6* 12.0  HCT 35.1* 34.3* 38.5  MCV 80.1 79.4 80.0  MCH 24.4* 24.5* 24.9*  MCHC 30.5 30.9 31.2  RDW 16.5* 16.7* 16.8*  PLT 516* 538* 517*    Cardiac EnzymesNo results for input(s): TROPONINI in the last 168 hours. No results for input(s): TROPIPOC in the last 168 hours.   BNP Recent Labs  Lab 05/12/17 0816  BNP 139.3*     DDimer No results for input(s): DDIMER in the last 168 hours.   Radiology    Dg Chest 2 View  Result Date: 05/11/2017 CLINICAL DATA:  Shortness of breath EXAM: CHEST - 2 VIEW COMPARISON:  05/08/2017 FINDINGS: Continued moderate layering left pleural effusion. Left lower lobe atelectasis or pneumonia is stable. Right lung clear.  Heart is borderline in size. No acute bony abnormality. IMPRESSION: Layering left effusion with left lower lobe atelectasis or pneumonia, unchanged. Electronically Signed   By: Rolm Baptise M.D.   On: 05/11/2017 12:53    Cardiac Studies   Echocardiogram 05/04/2017: Study Conclusions  - Left ventricle: The cavity size was normal. Systolic function was normal. The estimated ejection fraction was in the range of 60% to 65%. Wall motion was normal; there were no regional wall motion abnormalities. Doppler parameters are consistent with abnormal left ventricular relaxation (grade 1 diastolic dysfunction). - Pericardium, extracardiac: There was moderate thickening. A moderate pericardial effusion was identified circumferential to the heart. There was no evidence of hemodynamic compromise. There was no chamber collapse. There was evidence for mildly increased RV-LV interaction demonstrated by respirophasic changes in transmitral velocities. Features were not consistent with tamponade physiology.  Impressions:  - Findings suggest possible acute pericarditis with marked pericardial thickening. There is no evidence of tamponade, but there is subtle evidence of enhanced ventricular interdependence. Consider a component of constrictive physiology. Cardiac MRI may offer additional diagnostic information.  Cardiac MRI 05/05/2017  IMPRESSION: 1. Normal left ventricular size, with mild concentric hypertrophy and normal systolic function (LVEF =57). There are no regional wall motion abnormalities.  There is no late gadolinium enhancement in the left ventricular myocardium.  2. Normal right ventricular size, thickness and systolic function (LVEF =01%). There are no regional wall motion abnormalities.  3. Normal left and right atrial size.  4. Normal size of the aortic root, ascending aorta and pulmonary artery.  5. No significant valvular  abnormalities.  6. There is a small amount of pericardial effusion located predominantly lateral to the left ventricle with maximum thickness 9 mm. There is no chamber collapse, IVC is not dilated.  Pericardium is severely thickened measuring up to 6 mm with significant diffuse late gadolinium enhancement consistent with severe circumferential acute pericarditis. There is no evidence of myocarditis.  Significant epicardial fat adjacent to the right ventricle.  7. There is bilateral pleural effusion more prominent on the left.  2D echo limited 05/09/2017 Study Conclusions  - Left ventricle: The cavity size was normal. Wall thickness was normal. Systolic function was vigorous. The estimated ejection fraction was in the range of 65% to 70%. Wall motion was normal; there were no regional wall motion abnormalities. - Pericardium, extracardiac: A moderate pericardial effusion was identified. There was no evidence of hemodynamic compromise.    Patient Profile     77 y.o.  female with a history of type 2 diabetes mellitus, previous TIA, hypertension, hypothyroidism, and obesity, now presenting with acute pericarditis associated with effusion and significant pericardial thickening. Evaluation has included echocardiogram and cardiac MRI as noted above.  Assessment & Plan    .Acute pericarditiswith pericardial effusion.  -Viral respiratory screen negative and ANA negative.  -CRP 17 and ESR 64.  -Thyroid studies normal.  -Troponin I negative and cardiac MRI shows no clear evidence of myocarditis.She does have severe pericardial thickening (up to 6 mm by cardiac MRI). -Her pleuritic chest pain is significantly improved -I do not hear a rub on exam today -2D echocardiogram4/22showed no increase in sizeofpericardial effusion and no evidence of Tamponade - Colchicine was stopped yesterday because of questionable diarrhea although now the son is stating that  her stools are no different than they are at home but he was just concerned that with her debilitated state she would not be able to get the bathroom in time to have a BM.  The patient agrees that her stools are identical to what they are at home and there is no new diarrhea since coming to the hospital. - Will restart colchicine 0.6 mg twice daily and continue Motrin 800 mg every 8 hours - She is symptomatically improved - Continue PPI while on NSAIDs  2.Essential hypertension.  -BP is elevated this morning but was fairly well controlled yesterday. -Increased Cardizem CD to 360 mg last night -Follow BP readings today and may need to add a second blood pressure medicine  3.Hyperlipidemia -continue Pravastatin -LDL is at goal at 73  4. DM - continue Insulin pump - BSfor the most part have been controlled and this a.m. is 164  5.Deconditioning -PT is working with patient -She willneed skilled nursing facility upon discharge  6.UTI-cultures are pending -Started on Macrobid 100 mg twice daily for 5 days -Repeat UA due to fever shows only 0-5 white blood cells and no bacteria  7.Rash-denies any itching on her back today  8.Dizziness/episodes of staring -She has a history of vertigo in the past and physical therapy is going to work with her today -Her son is very concerned that her symptoms of numbness are not like her typical vertigo. It is hard to determine whether it is just due to overall deconditioning and she is feeling lightheaded when she gets up and moves around. -BPhas been normal during her episodes of dizziness -Her son is also concerned that she intermittently is having episodes where she compares completely blanked out -Appreciate neuro consult -EEG consistent with diffuse encephalopathy likely due to patient's underlying infection.  She is now in the process of getting an LTM EEG.  Neuro recommends loading with Keppra if pattern shows evolution  consistent with seizure  9.Cough with chest congestion -She has had wheezing on exam and when she coughs her chest sounds congested -Chest x-ray  showed layering left effusion and left lower lobe atelectasis or pneumonia -She is on Xopenex nebulizer treatments were started yesterday for wheezing -Encouraged her to be up out of bed in the chair -Appreciate pulmonary input -initially felt secondary to overall viral syndrome but patient spiked a temp 102 last night and pulmonary has had on antibiotics -Pulmonary may decide to perform thoracentesis today  The patient has multiple noncardiac problems all of which are prohibiting discharged to skilled nursing facility at this time.  Her pericardial effusion is stable and she is significantly approved symptomatically with treatment with colchicine and NSAIDs.  I will ask the hospitalist  to take over her care.  She will need follow-up in our office 1 week after discharge.    For questions or updates, please contact [place] Please consult [email or web address] for contact info under Cardiology/STEMI.      Signed, Fransico Him, MD  05/13/2017, 7:19 AM

## 2017-05-13 NOTE — Progress Notes (Signed)
vLTM EEG is complete. No skin breakdown 

## 2017-05-13 NOTE — Progress Notes (Signed)
EEG evaluated preliminarily. Awaiting official report. The GRDA seen on the initial EEG appears to occur intermittently on the LTM EEG and also appears to evolve after initiation before resolving with each episode, more suggestive of intermittent seizures than encephalopathic rhythm, which would be expected to be sustained.   A/R: Episodes of staring with retained ability to speak and possible stereotyped yawning.  1. EEG shows probable intermittent seizures. 2. Keppra 1000 mg IV load, then 500 mg IV BID 3. Continue to monitor. Awaiting official LTM EEG report in the AM.   Electronically signed: Dr. Caryl PinaEric Lagena Strand

## 2017-05-13 NOTE — Procedures (Signed)
PROCEDURE SUMMARY:  Small left pleural effusion. Successful US guided left thoracentesis. Yielded 350 mL of clear yellow fluid. Pt tolerated procedure well. No immediate complications.  Specimen was sent for labs. CXR ordered.  Brayton ElBRUNING, Gianah Batt PA-C 05/13/2017 3:51 PM

## 2017-05-13 NOTE — Progress Notes (Signed)
  Spoke w/ Dr Waymon AmatoHongalgi  [facility] is happy to see the patient and assume care. However, they do not feel they have anything to add today.  They will see the patient tomorrow and assume care at that time.   Theodore Demarkhonda Zakkary Thibault, PA-C 05/13/2017 1:19 PM Beeper [phone]

## 2017-05-13 NOTE — Procedures (Signed)
LTM-EEG Report  HISTORY: Continuous video-EEG monitoring performed for 77 year old with dizziness, altered mental status, abnormal EEG.  ACQUISITION: International 10-20 system for electrode placement; 18 channels with additional eyes linked to ipsilateral ears and EKG. Additional T1-T2 electrodes were used. Continuous video recording obtained.   EEG NUMBER:  MEDICATIONS:  Day 1: LEV  DAY #1: from 0331 to 0731 05/13/17  BACKGROUND: An overall medium voltage continuous recording with good spontaneous variability and reactivity. Waking background consisted of medium voltage theta-delta activity bilaterally and an intermittent 7-8Hz  posterior dominant rhythm. Some sparse superimposed low voltage beta activity was present in the bilateral frontocentral regions. Sleep was captured, however no clear stage II sleep architecture was seen. There was machine artifact from CPAP during portions of the record. EPILEPTIFORM/PERIODIC ACTIVITY: There were occasional bursts and short runs (up to 10-15 seconds) of bifrontal maximal intermittent rhythmic delta activity (GRDA) with frequency 1-2Hz  without ictal evolution or associated clinical signs. This pattern did not have a particular epileptiform appearance.  SEIZURES: none EVENTS: none  EKG: no significant arrhythmia  SUMMARY: This was a moderately abnormal continuous video EEG due to generalized background slowing with bursts and runs of GRDA. These patterns appeared more consistent with an encephalopathy pattern due to their smooth morphology, slow frequency, and lack of ictal evolution.

## 2017-05-14 ENCOUNTER — Inpatient Hospital Stay (HOSPITAL_COMMUNITY): Payer: Medicare Other

## 2017-05-14 DIAGNOSIS — I319 Disease of pericardium, unspecified: Secondary | ICD-10-CM

## 2017-05-14 DIAGNOSIS — R0789 Other chest pain: Secondary | ICD-10-CM

## 2017-05-14 DIAGNOSIS — J90 Pleural effusion, not elsewhere classified: Secondary | ICD-10-CM

## 2017-05-14 DIAGNOSIS — R0902 Hypoxemia: Secondary | ICD-10-CM

## 2017-05-14 LAB — GLUCOSE, CAPILLARY
Glucose-Capillary: 79 mg/dL (ref 65–99)
Glucose-Capillary: 157 mg/dL — ABNORMAL HIGH (ref 65–99)
Glucose-Capillary: 160 mg/dL — ABNORMAL HIGH (ref 65–99)
Glucose-Capillary: 242 mg/dL — ABNORMAL HIGH (ref 65–99)
Glucose-Capillary: 120 mg/dL — ABNORMAL HIGH (ref 65–99)

## 2017-05-14 LAB — BASIC METABOLIC PANEL
Anion gap: 11 (ref 5–15)
BUN: 13 mg/dL (ref 6–20)
CALCIUM: 8.5 mg/dL — AB (ref 8.9–10.3)
CO2: 22 mmol/L (ref 22–32)
CREATININE: 0.89 mg/dL (ref 0.44–1.00)
Chloride: 96 mmol/L — ABNORMAL LOW (ref 101–111)
GFR calc Af Amer: >60 mL/min (ref >60)
Glucose, Bld: 179 mg/dL — ABNORMAL HIGH (ref 65–99)
Potassium: 4.1 mmol/L (ref 3.5–5.1)
SODIUM: 129 mmol/L — AB (ref 135–145)

## 2017-05-14 LAB — ACID FAST SMEAR (AFB)

## 2017-05-14 LAB — ACID FAST SMEAR (AFB, MYCOBACTERIA): Acid Fast Smear: NEGATIVE

## 2017-05-14 LAB — LACTIC ACID, PLASMA
LACTIC ACID, VENOUS: 1.2 mmol/L (ref 0.5–1.9)
Lactic Acid, Venous: 1.1 mmol/L (ref 0.5–1.9)

## 2017-05-14 LAB — PROCALCITONIN: Procalcitonin: 0.17 ng/mL

## 2017-05-14 LAB — TROPONIN I

## 2017-05-14 MED ORDER — ORAL CARE MOUTH RINSE
15.0000 mL | Freq: Two times a day (BID) | OROMUCOSAL | Status: DC
  Administered 2017-05-14 (×2): 15 mL via OROMUCOSAL

## 2017-05-14 MED ORDER — VANCOMYCIN HCL IN DEXTROSE 750-5 MG/150ML-% IV SOLN
750.0000 mg | Freq: Two times a day (BID) | INTRAVENOUS | Status: DC
  Administered 2017-05-14 – 2017-05-16 (×5): 750 mg via INTRAVENOUS
  Filled 2017-05-14 (×7): qty 150

## 2017-05-14 MED ORDER — ACETAMINOPHEN 650 MG RE SUPP
650.0000 mg | RECTAL | Status: DC | PRN
  Administered 2017-05-14 – 2017-05-15 (×2): 650 mg via RECTAL
  Filled 2017-05-14 (×3): qty 1

## 2017-05-14 MED ORDER — ACETAMINOPHEN 60 MG HALF SUPP: 60.0000 mg | RECTAL | Status: DC | PRN

## 2017-05-14 MED ORDER — CHLORHEXIDINE GLUCONATE 0.12 % MT SOLN
15.0000 mL | Freq: Two times a day (BID) | OROMUCOSAL | Status: DC
  Administered 2017-05-15 – 2017-05-16 (×2): 15 mL via OROMUCOSAL
  Filled 2017-05-14 (×2): qty 15

## 2017-05-14 NOTE — Progress Notes (Addendum)
PROGRESS NOTE    [name]  HYQ:[id number] DOB: 06-10-1940 DOA: 05/04/2017 PCP: Merrilee Seashore, MD   Brief Narrative: Patient is a 77 year old female with past medical history of coronary artery disease status post cardiac catheterization obesity, diabetes type 2, TIA,, hypertension who presented for the evaluation of shortness of breath.  Imaging was suggestive of large pericardial effusion on left pleural effusion.  Patient was admitted to cardiology service.  She has been transferred to hospital service today 05/14/17. Patient has been started on Motrin colchicine for recurrent pericarditis, pericardial effusion.  She also underwent thoracentesis for left-sided pleural effusion. She has ongoing chest pain, shortness of breath.  Assessment & Plan:   Active Problems:   Essential hypertension, benign   Hyperlipemia   Hyperlipidemia   Morbid obesity (HCC)   SOB (shortness of breath)   Atypical chest pain   Pericardial effusion   Pericarditis   Cough   Pleural effusion on left  Acute pericarditis: With pericardial effusion:  She had a history of pericarditis in the past.  Suspected to be viral.  Anti-negative.  Elevated ESR and CRP.  Thyroid studies normal. 2D echocardiogram on 4/22 showed no increase in pericardial effusion size, no evidence of tamponade. Patient has been started on colchicine and Motrin which will be continued until she sees cardiology as an outpatient.  Plan is to repeat echocardiogram in 2 to 3 weeks. Patient again complained of chest pain today.  Troponin negative.  EKG did not show any acute changes. Continue PPI while on NSAIDs.  Acute dyspnea: She had to be put on nonrebreather today for dyspnea.  Chest x-ray done today showed trace left pleural effusion with patchy basilar opacity which is not a new finding.  She underwent thoracentesis for left-sided pleural effusion.  Pulmonology was following.  We will follow-up cytology. Continue bronchodilator  treatment as necessary.  Fever: Source unknown.  Will repeat urine culture.  Blood cultures sent again.  Procalcitonin nonreassuring.  Lactate level normal.  Continue Zosyn.  Started on vancomycin too. She was just treated for UTI with Macrobid.  Patient denies any dysuria.  Hypertension: Currently blood pressure stable.  Continue Cardizem.  Hyperlipidemia: Continue pravastatin.  Diabetes mellitus: Continue insulin, we will continue to monitor blood sugars.  Deconditioning/dizziness :PT following.  C might need to skilled nursing facility on discharge.  Episodes of staring: Neurology following.  EEG was consistent with diffuse encephalopathy.  Neurology planning for LTM EEG?  On carbamazepine.  Hyponatremia: We will continue to monitor.  Obstructive Sleep Apnea: Continue Cpap HS.     DVT prophylaxis: Hep [place] Code Status: Full Family Communication: Son present at the bed side Disposition Plan: Likely skilled nursing facility.  Pending PT evaluation   Consultants: Cardiology, pulmonary  Procedures: Thoracentesis  Antimicrobials: Zosyn, vancomycin  Subjective:  Patient seen and examined the bedside this morning.  Was complaining of chest pain shortness of breath.  Put on nonrebreather.  Son on the bedside. Objective: Vitals:   05/14/17 1015 05/14/17 1103 05/14/17 1115 05/14/17 1159  BP: (!) 127/56   128/71  Pulse:  99 96 90  Resp:  19  20  Temp:    (!) 100.7 F (38.2 C)  TempSrc:    Rectal  SpO2:  97% 99% 95%  Weight:      Height:        Intake/Output Summary (Last 24 hours) at 05/14/2017 1456 Last data filed at 05/14/2017 1049 Gross per 24 hour  Intake 170 ml  Output 200 ml  Net -30 ml   Filed Weights   05/11/17 0615 05/12/17 0624 05/14/17 0444  Weight: 100.4 kg (221 lb 4.8 oz) 99.8 kg (220 lb 1.6 oz) 99.8 kg (220 lb)    Examination:  General exam: Appears in mild respiratory distress,average built,febrile HEENT:PERRL,Oral mucosa moist, Ear/Nose normal on  gross exam Respiratory system: Bilateral clear Cardiovascular system: S1 & S2 heard, RRR. No JVD, murmurs, rubs, gallops or clicks. Trace pedal edema. Gastrointestinal system: Abdomen is nondistended, soft and nontender. No organomegaly or masses felt. Normal bowel sounds heard. Central nervous system: Alert and oriented. No focal neurological deficits. Extremities: trace edema, no clubbing ,no cyanosis, distal peripheral pulses palpable. Skin: No rashes, lesions or ulcers,no icterus ,no pallor MSK: Normal muscle bulk,tone ,power Psychiatry: Judgement and insight appear normal. Mood & affect appropriate.     Data Reviewed: I have personally reviewed following labs and imaging studies  CBC: Recent Labs  Lab 05/08/17 0958 05/08/17 1647 05/09/17 0505 05/12/17 0816  WBC 14.2* 12.6* 8.6 9.0  NEUTROABS  --  9.5*  --  6.2  HGB 11.3* 10.7* 10.6* 12.0  HCT 35.4* 35.1* 34.3* 38.5  MCV 78.3 80.1 79.4 80.0  PLT 562* 516* 538* 440*   Basic Metabolic Panel: Recent Labs  Lab 05/08/17 0958 05/09/17 0505 05/12/17 0816 05/14/17 1114  NA 131* 132* 136 129*  K 5.0 4.2 4.4 4.1  CL 102 103 104 96*  CO2 17* 21* 22 22  GLUCOSE 250* 167* 166* 179*  BUN 18 24* 13 13  CREATININE 1.12* 1.05* 0.78 0.89  CALCIUM 8.6* 8.3* 9.4 8.5*   GFR: Estimated Creatinine Clearance: 60.6 mL/min (by C-G formula based on SCr of 0.89 mg/dL). Liver Function Tests: No results for input(s): AST, ALT, ALKPHOS, BILITOT, PROT, ALBUMIN in the last 168 hours. No results for input(s): LIPASE, AMYLASE in the last 168 hours. No results for input(s): AMMONIA in the last 168 hours. Coagulation Profile: No results for input(s): INR, PROTIME in the last 168 hours. Cardiac Enzymes: Recent Labs  Lab 05/14/17 1114  TROPONINI <0.03   BNP (last 3 results) No results for input(s): PROBNP in the last 8760 hours. HbA1C: No results for input(s): HGBA1C in the last 72 hours. CBG: Recent Labs  Lab 05/13/17 1655  05/13/17 2144 05/14/17 0251 05/14/17 0719 05/14/17 1145  GLUCAP 64* 68 79 157* 160*   Lipid Profile: No results for input(s): CHOL, HDL, LDLCALC, TRIG, CHOLHDL, LDLDIRECT in the last 72 hours. Thyroid Function Tests: No results for input(s): TSH, T4TOTAL, FREET4, T3FREE, THYROIDAB in the last 72 hours. Anemia Panel: No results for input(s): VITAMINB12, FOLATE, FERRITIN, TIBC, IRON, RETICCTPCT in the last 72 hours. Sepsis Labs: Recent Labs  Lab 05/14/17 1114  PROCALCITON 0.17  LATICACIDVEN 1.2    Recent Results (from the past 240 hour(s))  Respiratory Panel by PCR     Status: None   Collection Time: 05/06/17  9:02 AM  Result Value Ref Range Status   Adenovirus NOT DETECTED NOT DETECTED Final   Coronavirus 229E NOT DETECTED NOT DETECTED Final   Coronavirus HKU1 NOT DETECTED NOT DETECTED Final   Coronavirus NL63 NOT DETECTED NOT DETECTED Final   Coronavirus OC43 NOT DETECTED NOT DETECTED Final   Metapneumovirus NOT DETECTED NOT DETECTED Final   Rhinovirus / Enterovirus NOT DETECTED NOT DETECTED Final   Influenza A NOT DETECTED NOT DETECTED Final   Influenza B NOT DETECTED NOT DETECTED Final   Parainfluenza Virus 1 NOT DETECTED NOT DETECTED Final   Parainfluenza Virus 2 NOT  DETECTED NOT DETECTED Final   Parainfluenza Virus 3 NOT DETECTED NOT DETECTED Final   Parainfluenza Virus 4 NOT DETECTED NOT DETECTED Final   Respiratory Syncytial Virus NOT DETECTED NOT DETECTED Final   Bordetella pertussis NOT DETECTED NOT DETECTED Final   Chlamydophila pneumoniae NOT DETECTED NOT DETECTED Final   Mycoplasma pneumoniae NOT DETECTED NOT DETECTED Final    Comment: Performed at [facility], [place] [address], [place], [place] [address]  Culture, blood (routine x 2)     Status: None (Preliminary result)   Collection Time: 05/12/17  5:36 PM  Result Value Ref Range Status   Specimen Description BLOOD LEFT ANTECUBITAL  Final   Special Requests   Final    BOTTLES DRAWN AEROBIC AND  ANAEROBIC Blood Culture adequate volume   Culture   Final    NO GROWTH 2 DAYS Performed at [facility], [place] [address] [place], [place] [address]    Report Status PENDING  Incomplete  Culture, blood (routine x 2)     Status: None (Preliminary result)   Collection Time: 05/12/17  5:36 PM  Result Value Ref Range Status   Specimen Description BLOOD LEFT FOREARM  Final   Special Requests   Final    BOTTLES DRAWN AEROBIC AND ANAEROBIC Blood Culture adequate volume   Culture   Final    NO GROWTH 2 DAYS Performed at [facility], [place] [address], [place], [place] [address]    Report Status PENDING  Incomplete  Culture, Urine     Status: Abnormal   Collection Time: 05/12/17  7:05 PM  Result Value Ref Range Status   Specimen Description URINE, CLEAN CATCH  Final   Special Requests   Final    NONE Performed at [facility], [place] [address], [place],  [address]    Culture MULTIPLE SPECIES PRESENT, SUGGEST RECOLLECTION (A)  Final   Report Status 05/13/2017 FINAL  Final  C difficile quick scan w PCR reflex     Status: None   Collection Time: 05/13/17  3:03 PM  Result Value Ref Range Status   C Diff antigen NEGATIVE NEGATIVE Final   C Diff toxin NEGATIVE NEGATIVE Final   C Diff interpretation No C. difficile detected.  Final         Radiology Studies: Dg Chest 1 View  Result Date: 05/13/2017 CLINICAL DATA:  Post thoracentesis EXAM: CHEST  1 VIEW COMPARISON:  05/13/2017, 05/11/2017 FINDINGS: Low lung volumes. Slight decreased left pleural effusion. No pneumothorax. Mild left basilar opacity. Cardiomegaly with vascular congestion. IMPRESSION: 1. Negative for pneumothorax 2. Trace left pleural effusion with patchy basilar opacity. Cardiomegaly with mild vascular congestion. Electronically Signed   By: Donavan Foil M.D.   On: 05/13/2017 16:14   Dg Chest Port 1 View  Result Date: 05/13/2017 CLINICAL DATA:  Pleural effusion EXAM: PORTABLE CHEST 1 VIEW COMPARISON:   May 11, 2017 FINDINGS: There is a fairly small left pleural effusion with patchy consolidation in the left base. Lungs elsewhere clear. Heart is mildly enlarged with pulmonary vascularity within normal limits. No adenopathy. No bone lesions. IMPRESSION: Fairly small left pleural effusion with left base consolidation. Lungs elsewhere clear. Stable cardiac prominence. Electronically Signed   By: Lowella Grip III M.D.   On: 05/13/2017 09:52        Scheduled Meds: . aspirin EC  81 mg Oral Daily  . carbamazepine  200 mg Oral BID  . [START ON 05/15/2017] chlorhexidine  15 mL  Mouth Rinse BID  . colchicine  0.6 mg Oral BID  . diltiazem  360 mg Oral QHS  . donepezil  10 mg Oral QHS  . feeding supplement (GLUCERNA SHAKE)  237 mL Oral Q24H  . gabapentin  300 mg Oral BID  . heparin  5,000 Units Subcutaneous Q8H  . ibuprofen  800 mg Oral TID  . insulin aspart  0-5 Units Subcutaneous QHS  . insulin pump   Subcutaneous TID AC, HS, 0200  . ipratropium-albuterol  3 mL Nebulization BID  . levothyroxine  75 mcg Oral QAC breakfast  . mouth rinse  15 mL Mouth Rinse q12n4p  . montelukast  5 mg Oral QHS  . OLANZapine  5 mg Oral QHS  . pantoprazole  40 mg Oral Daily  . pravastatin  20 mg Oral Daily   Continuous Infusions: . piperacillin-tazobactam (ZOSYN)  IV Stopped (05/14/17 1453)     LOS: 10 days    Time spent: 35 mins.More than 50% of that time was spent in counseling and/or coordination of care.      Shelly Coss, MD [facility] Pager [phone]  If 7PM-7AM, please contact night-coverage [email or web address] Password [facility] 05/14/2017, 2:56 PM

## 2017-05-14 NOTE — Progress Notes (Signed)
Name: [name] MRN: [id number] DOB: 16-Oct-1940    ADMISSION DATE:  05/04/2017 CONSULTATION DATE:  4/25  REFERRING MD :  Mayford Knife (cards)   CHIEF COMPLAINT:  Dyspnea, cough   BRIEF PATIENT DESCRIPTION: 77 year old female never smoker with history of CAD, diabetes, hypertension, obesity, OSA who is intermittently compliant with CPAP at home.  She presented 4/17 with shortness of breath and pleuritic chest pain.  Work-up included CT chest which was positive for large pericardial effusion and left pleural effusion.  She was admitted by cardiology with acute pericarditis.  She is improving slowly but on 4/25 remains short of breath, congested, complains of cough and PCCM was asked to evaluate.  SIGNIFICANT EVENTS    STUDIES:  Echo 4/17>>>  Findings suggest possible acute pericarditis with marked pericardial thickening. There is no evidence of tamponade, but there is subtle evidence of enhanced ventricular interdependence. Consider a component of constrictive physiology. Cardiac MRI may offer additional diagnostic information.  Cardiac MRI 4/18>>>1. Normal left ventricular size, with mild concentric hypertrophy and normal systolic function (LVEF =66). There are no regional wall motion abnormalities.  SUBJECTIVE:  Feels better VITAL SIGNS: Temp:  [98.4 F (36.9 C)-102.2 F (39 C)] 100.7 F (38.2 C) (04/27 1159) Pulse Rate:  [81-113] 90 (04/27 1159) Resp:  [16-20] 20 (04/27 1159) BP: (122-163)/(56-80) 128/71 (04/27 1159) SpO2:  [93 %-99 %] 95 % (04/27 1159) Weight:  [220 lb (99.8 kg)] 220 lb (99.8 kg) (04/27 0444)  PHYSICAL EXAMINATION: General pleasant white female sitting up in bed no disterss HENT NCAT no JVD. + upper airway wheeze  Card RRR pulm decreased bases no accessory use  abd soft not tender + bowel sounds Ext warm and dry does have LE edema Neuro awake and oriented    Recent Labs  Lab 05/09/17 0505 05/12/17 0816 05/14/17 1114  NA 132* 136  129*  K 4.2 4.4 4.1  CL 103 104 96*  CO2 21* 22 22  BUN 24* 13 13  CREATININE 1.05* 0.78 0.89  GLUCOSE 167* 166* 179*   Recent Labs  Lab 05/08/17 1647 05/09/17 0505 05/12/17 0816  HGB 10.7* 10.6* 12.0  HCT 35.1* 34.3* 38.5  WBC 12.6* 8.6 9.0  PLT 516* 538* 517*   Dg Chest 1 View  Result Date: 05/13/2017 CLINICAL DATA:  Post thoracentesis EXAM: CHEST  1 VIEW COMPARISON:  05/13/2017, 05/11/2017 FINDINGS: Low lung volumes. Slight decreased left pleural effusion. No pneumothorax. Mild left basilar opacity. Cardiomegaly with vascular congestion. IMPRESSION: 1. Negative for pneumothorax 2. Trace left pleural effusion with patchy basilar opacity. Cardiomegaly with mild vascular congestion. Electronically Signed   By: Jasmine Pang M.D.   On: 05/13/2017 16:14   Dg Chest Port 1 View  Result Date: 05/13/2017 CLINICAL DATA:  Pleural effusion EXAM: PORTABLE CHEST 1 VIEW COMPARISON:  May 11, 2017 FINDINGS: There is a fairly small left pleural effusion with patchy consolidation in the left base. Lungs elsewhere clear. Heart is mildly enlarged with pulmonary vascularity within normal limits. No adenopathy. No bone lesions. IMPRESSION: Fairly small left pleural effusion with left base consolidation. Lungs elsewhere clear. Stable cardiac prominence. Electronically Signed   By: Bretta Bang III M.D.   On: 05/13/2017 09:52    Intake/Output Summary (Last 24 hours) at 05/14/2017 1407 Last data filed at 05/14/2017 1049 Gross per 24 hour  Intake 170 ml  Output 200 ml  Net -30 ml      ASSESSMENT / PLAN:  Dyspnea w/cough Acute pericarditis  OSA Cor Pulmonale  Small left pleural effusion HTN HL DM UTI Possible seizure vs encephalopathy  Discussion Multifactorial dyspnea in setting of pericarditis, prob decompensated OSA and possibly vol overload. Had left pleural effusion which is exudative by lights criteria -->suspect same inflammatory process do not think that the fever is pulmonary  source  cxr review w/ minimal L base loss I think although this was exudate there is a component of Cor Pulmonale that is responsible for the effusion as well.    Plan Cont lasix Cont colchicine and motrin F/u cytology  Mandatory HS CPAP We will be available as needed   Simonne Martinet ACNP-BC [facility] Pulmonary/Critical Care Pager # [phone] OR # [phone] if no answer

## 2017-05-14 NOTE — Progress Notes (Signed)
Morning rectal temp 102.2 F. Administered tylenol suppository.  Called to room by son, pt c/o chest pressure and pain 8/10. Was on home CPAP and stated she could not breath. Oxygen 90%. Placed on nasal canula 6L. Patient also tachycardic. Re-checked rectal temp- 102 F. Patient was shaking and flushed. Still c/o difficulty breathing. Placed on non-rebreather mask. Administered 2 sublingual nitroglycerins with no immediate relief. EKG done. BP stable.   Attending at bedside. Placed back on nasal canula, tolerating well. States her pain has decreased. Will continue to monitor.

## 2017-05-14 NOTE — Progress Notes (Signed)
Pharmacy Antibiotic Note  [name] is a 77 y.o. female admitted on 05/04/2017 with sepsis.  Pharmacy has been consulted for vancomycin dosing.  Plan: Vancomycin 750 IV every 12 hours.  Goal trough 15-20 mcg/mL.  Monitor clinical progress, LOT, cultures  Height:  (160 cm) Weight: 220 lb (99.8 kg) IBW/kg (Calculated) : 52.4  Temp (24hrs), Avg:100.2 F (37.9 C), Min:98.4 F (36.9 C), Max:102.2 F (39 C)  Recent Labs  Lab 05/08/17 0958 05/08/17 1647 05/09/17 0505 05/12/17 0816 05/14/17 1114  WBC 14.2* 12.6* 8.6 9.0  --   CREATININE 1.12*  --  1.05* 0.78 0.89  LATICACIDVEN  --   --   --   --  1.2    Estimated Creatinine Clearance: 60.6 mL/min (by C-G formula based on SCr of 0.89 mg/dL).    Allergies  Allergen Reactions  . Flagyl [Metronidazole Hcl] Itching and Rash  . Ciprofloxacin Itching and Rash  . Metformin And Related Rash  . Milk-Related Compounds Other (See Comments)    Stomach pains  . Other Other (See Comments)    [place] nuts cause severe facial redness    Antimicrobials this admission: Vancomycin 4/27 > Zosyn 4/25 >  Microbiology results: Blood Cx x2 4/25 > ngtd Cdiff 4/26 > negative Urine 4/25 > multiple species present Resp panel 4/19 > negative  Thank you for allowing pharmacy to be a part of this patient's care.   Eleni Frank L. Marcy Salvo, PharmD, MS PGY1 Pharmacy Resident Pager: [phone]

## 2017-05-14 NOTE — Progress Notes (Signed)
Progress Note  Patient Name: [name] Date of Encounter: 05/14/2017  Primary Cardiologist: Sanda Klein, MD   Subjective   Chest pain improved.  S/P thoracentesis of clear yellow fluid yesterday.  Colchicine restarted as patient told me last night that she has no increase in stools compared to home and what her son was concerned about was her not being able to make it to bathroom in time.  Inpatient Medications    Scheduled Meds: . aspirin EC  81 mg Oral Daily  . carbamazepine  200 mg Oral BID  . [START ON 05/15/2017] chlorhexidine  15 mL Mouth Rinse BID  . colchicine  0.6 mg Oral BID  . diltiazem  360 mg Oral QHS  . donepezil  10 mg Oral QHS  . feeding supplement (GLUCERNA SHAKE)  237 mL Oral Q24H  . gabapentin  300 mg Oral BID  . heparin  5,000 Units Subcutaneous Q8H  . ibuprofen  800 mg Oral TID  . insulin aspart  0-5 Units Subcutaneous QHS  . insulin pump   Subcutaneous TID AC, HS, 0200  . ipratropium-albuterol  3 mL Nebulization BID  . levothyroxine  75 mcg Oral QAC breakfast  . mouth rinse  15 mL Mouth Rinse q12n4p  . montelukast  5 mg Oral QHS  . nitrofurantoin (macrocrystal-monohydrate)  100 mg Oral Q12H  . OLANZapine  5 mg Oral QHS  . pantoprazole  40 mg Oral Daily  . pravastatin  20 mg Oral Daily   Continuous Infusions: . piperacillin-tazobactam (ZOSYN)  IV Stopped (05/14/17 0535)   PRN Meds: acetaminophen, acetaminophen, guaiFENesin-dextromethorphan, HYDROcodone-acetaminophen, hydrocortisone cream, ipratropium-albuterol, lidocaine, loperamide, nitroGLYCERIN, ondansetron (ZOFRAN) IV   Vital Signs    Vitals:   05/14/17 0444 05/14/17 0650 05/14/17 0721 05/14/17 0842  BP:   (!) 163/80 130/66  Pulse:   (!) 110 (!) 113  Resp:   18   Temp:  98.4 F (36.9 C) (!) 102.2 F (39 C)   TempSrc:  Oral Rectal   SpO2:   93% 94%  Weight: 220 lb (99.8 kg)     Height:        Intake/Output Summary (Last 24 hours) at 05/14/2017 0915 Last data filed at  05/13/2017 2015 Gross per 24 hour  Intake 50 ml  Output 200 ml  Net -150 ml   Filed Weights   05/11/17 0615 05/12/17 0624 05/14/17 0444  Weight: 221 lb 4.8 oz (100.4 kg) 220 lb 1.6 oz (99.8 kg) 220 lb (99.8 kg)    Telemetry    NSR - Personally Reviewed  ECG    No new EKG reviewed - Personally Reviewed  Physical Exam   GEN: No acute distress.   Neck: No JVD Cardiac: RRR, no murmurs, rubs, or gallops.  Respiratory: Clear to auscultation bilaterally. GI: Soft, nontender, non-distended  MS: No edema; No deformity. Neuro:  Nonfocal  Psych: Normal affect   Labs    Chemistry Recent Labs  Lab 05/08/17 0958 05/09/17 0505 05/12/17 0816  NA 131* 132* 136  K 5.0 4.2 4.4  CL 102 103 104  CO2 17* 21* 22  GLUCOSE 250* 167* 166*  BUN 18 24* 13  CREATININE 1.12* 1.05* 0.78  CALCIUM 8.6* 8.3* 9.4  GFRNONAA 46* 50* >60  GFRAA 54* 58* >60  ANIONGAP _0 Hematology Recent Labs  Lab 05/08/17 1647 05/09/17 0505 05/12/17 0816  WBC 12.6* 8.6 9.0  RBC 4.38 4.32 4.81  HGB 10.7* 10.6* 12.0  HCT 35.1*  34.3* 38.5  MCV 80.1 79.4 80.0  MCH 24.4* 24.5* 24.9*  MCHC 30.5 30.9 31.2  RDW 16.5* 16.7* 16.8*  PLT 516* 538* 517*    Cardiac EnzymesNo results for input(s): TROPONINI in the last 168 hours. No results for input(s): TROPIPOC in the last 168 hours.   BNP Recent Labs  Lab 05/12/17 0816  BNP 139.3*     DDimer No results for input(s): DDIMER in the last 168 hours.   Radiology    Dg Chest 1 View  Result Date: 05/13/2017 CLINICAL DATA:  Post thoracentesis EXAM: CHEST  1 VIEW COMPARISON:  05/13/2017, 05/11/2017 FINDINGS: Low lung volumes. Slight decreased left pleural effusion. No pneumothorax. Mild left basilar opacity. Cardiomegaly with vascular congestion. IMPRESSION: 1. Negative for pneumothorax 2. Trace left pleural effusion with patchy basilar opacity. Cardiomegaly with mild vascular congestion. Electronically Signed   By: Donavan Foil M.D.   On: 05/13/2017  16:14   Dg Chest Port 1 View  Result Date: 05/13/2017 CLINICAL DATA:  Pleural effusion EXAM: PORTABLE CHEST 1 VIEW COMPARISON:  May 11, 2017 FINDINGS: There is a fairly small left pleural effusion with patchy consolidation in the left base. Lungs elsewhere clear. Heart is mildly enlarged with pulmonary vascularity within normal limits. No adenopathy. No bone lesions. IMPRESSION: Fairly small left pleural effusion with left base consolidation. Lungs elsewhere clear. Stable cardiac prominence. Electronically Signed   By: Lowella Grip III M.D.   On: 05/13/2017 09:52    Cardiac Studies   Echocardiogram 05/04/2017: Study Conclusions  - Left ventricle: The cavity size was normal. Systolic function was normal. The estimated ejection fraction was in the range of 60% to 65%. Wall motion was normal; there were no regional wall motion abnormalities. Doppler parameters are consistent with abnormal left ventricular relaxation (grade 1 diastolic dysfunction). - Pericardium, extracardiac: There was moderate thickening. A moderate pericardial effusion was identified circumferential to the heart. There was no evidence of hemodynamic compromise. There was no chamber collapse. There was evidence for mildly increased RV-LV interaction demonstrated by respirophasic changes in transmitral velocities. Features were not consistent with tamponade physiology.  Impressions:  - Findings suggest possible acute pericarditis with marked pericardial thickening. There is no evidence of tamponade, but there is subtle evidence of enhanced ventricular interdependence. Consider a component of constrictive physiology. Cardiac MRI may offer additional diagnostic information.  Cardiac MRI 05/05/2017  IMPRESSION: 1. Normal left ventricular size, with mild concentric hypertrophy and normal systolic function (LVEF =56). There are no regional wall motion abnormalities.  There is  no late gadolinium enhancement in the left ventricular myocardium.  2. Normal right ventricular size, thickness and systolic function (LVEF =97%). There are no regional wall motion abnormalities.  3. Normal left and right atrial size.  4. Normal size of the aortic root, ascending aorta and pulmonary artery.  5. No significant valvular abnormalities.  6. There is a small amount of pericardial effusion located predominantly lateral to the left ventricle with maximum thickness 9 mm. There is no chamber collapse, IVC is not dilated.  Pericardium is severely thickened measuring up to 6 mm with significant diffuse late gadolinium enhancement consistent with severe circumferential acute pericarditis. There is no evidence of myocarditis.  Significant epicardial fat adjacent to the right ventricle.  7. There is bilateral pleural effusion more prominent on the left.  2D echo limited 05/09/2017 Study Conclusions  - Left ventricle: The cavity size was normal. Wall thickness was normal. Systolic function was vigorous. The estimated ejection fraction was in the  range of 65% to 70%. Wall motion was normal; there were no regional wall motion abnormalities. - Pericardium, extracardiac: A moderate pericardial effusion was identified. There was no evidence of hemodynamic compromise.   Patient Profile     77 y.o. female with a history of type 2 diabetes mellitus, previous TIA, hypertension, hypothyroidism, and obesity, now presenting with acute pericarditis associated with effusion and significant pericardial thickening. Evaluation has included echocardiogram and cardiac MRI as noted above.  Assessment & Plan    Acute pericarditiswith pericardial effusion.  -Viral respiratory screen negative and ANA negative.  -CRP 17 and ESR 64.  -Thyroid studies normal.  -Troponin I negative and cardiac MRI shows no clear evidence of myocarditis.She does have severe  pericardial thickening (up to 6 mm by cardiac MRI). -Her pleuritic chest pain is significantly improved -I do not hear a rub on exam today -2D echocardiogram4/22showed no increase in sizeofpericardial effusion and no evidence of Tamponade -Colchicine now restarted since patient and her son say that her quantity and quality of stools are unchanged from what she has at home - Continue colchicine 0.6 mg twice daily and continue Motrin 800 mg every 8 hours - She is symptomatically improved -Continue PPI while on NSAIDs  2.Essential hypertension.  -BP fairly well controlled at 130/47mHg -Continue Cardizem CD to 360 mg daily  3.Hyperlipidemia -continue Pravastatin -LDL is at goal at 73  4. DM - continue Insulin pump - BSfor the most part have been controlledand this a.m. is 157  5.Deconditioning -PT is working with patient -She willneed skilled nursing facility upon discharge  6.UTI-cultures are pending -Started on Macrobid 100 mg twice daily for 5 days -Repeat UA due to fever shows only 0-5 white blood cells and no bacteria  7.Rash-denies any itching on her back today  8.Dizziness/episodes of staring -She has a history of vertigo in the past and physical therapy is going to work with her today -Her son is very concerned that her symptoms of numbness are not like her typical vertigo. It is hard to determine whether it is just due to overall deconditioning and she is feeling lightheaded when she gets up and moves around. -BPhas been normal during her episodes of dizziness -Her son is also concerned that she intermittently is having episodes where she compares completely blanked out -Appreciate neuro consult -EEG consistent with diffuse encephalopathy likely due to patient's underlying infection.  She is now in the process of getting an LTM EEG.  Neuro recommends loading with Keppra if pattern shows evolution consistent with seizure -further recs  per Neuro  9.Cough with chest congestion -She has had wheezing on exam and when she coughs her chest sounds congested -Chest x-ray  showed layering left effusion and left lower lobe atelectasis or pneumonia -She is onXopenex nebulizer treatments were started yesterday for wheezing -Encouraged her to be up out of bed in the chair -Appreciate pulmonary input -initially felt secondary to overall viral syndrome but patient spiked a temp 102 last night and pulmonary has had on antibiotics -s/p thoracentesis yesterday  There are no ongoing cardiac issues at this time and [facility] has assumed primary care of patient on their service.  Will sign off.  Please have her followup with Dr. CSallyanne Kusteron discharge.    For questions or updates, please contact CNortheast [place]Please consult [email or web address] for contact info under Cardiology/STEMI.      Signed, TFransico Him MD  05/14/2017, 9:15 AM

## 2017-05-14 NOTE — Progress Notes (Signed)
Rectal temp rechecked 100.7 F. Patient resting. Will continue to monitor.

## 2017-05-14 NOTE — Progress Notes (Signed)
S: Patient wearing CPAP, had poor sleep yesterday  Examination  Vital signs in last 24 hours: Temp:  [98.4 F (36.9 C)-102.2 F (39 C)] 102 F (38.9 C) (04/27 1000) Pulse Rate:  [81-113] 99 (04/27 1103) Resp:  [16-19] 19 (04/27 1103) BP: (122-163)/(56-80) 127/56 (04/27 1015) SpO2:  [92 %-97 %] 97 % (04/27 1103) Weight:  [99.8 kg (220 lb)] 99.8 kg (220 lb) (04/27 0444)  General: Not in distress, cooperative  Neuro: MS: Alert, oriented, follows commands CN: pupils equal and reactive,  EOMI, face symmetric Motor: moves all 4 extremities with good strength   Coagulation Studies: No results for input(s): LABPROT, INR in the last 72 hours.  Imaging Reviewed:     ASSESSMENT AND PLAN  Spells of decreased alertness:   No electrographic correlate on Long term EEG, discontinue Keppra. Encouraged to wear CPAP. MRI brain cancelled, can be done as outpatient if symptoms persist despite wearing CPAP.    Neurology will sign off.     Georgiana Spinner Ameir Faria [facility] Pager Number [phone] For questions after 7pm please refer to AMION to reach the Neurologist on call

## 2017-05-15 LAB — GLUCOSE, CAPILLARY
Glucose-Capillary: 130 mg/dL — ABNORMAL HIGH (ref 65–99)
Glucose-Capillary: 189 mg/dL — ABNORMAL HIGH (ref 65–99)
Glucose-Capillary: 146 mg/dL — ABNORMAL HIGH (ref 65–99)
Glucose-Capillary: 128 mg/dL — ABNORMAL HIGH (ref 65–99)

## 2017-05-15 LAB — CBC WITH DIFFERENTIAL/PLATELET
BASOS PCT: 0 %
Basophils Absolute: 0 10*3/uL (ref 0.0–0.1)
Eosinophils Absolute: 0.1 10*3/uL (ref 0.0–0.7)
Eosinophils Relative: 1 %
HEMATOCRIT: 35.7 % — AB (ref 36.0–46.0)
HEMOGLOBIN: 11.6 g/dL — AB (ref 12.0–15.0)
LYMPHS ABS: 1.1 10*3/uL (ref 0.7–4.0)
LYMPHS PCT: 7 %
MCH: 25.2 pg — ABNORMAL LOW (ref 26.0–34.0)
MCHC: 32.5 g/dL (ref 30.0–36.0)
MCV: 77.6 fL — AB (ref 78.0–100.0)
MONOS PCT: 9 %
Monocytes Absolute: 1.5 10*3/uL — ABNORMAL HIGH (ref 0.1–1.0)
NEUTROS ABS: 12.8 10*3/uL — AB (ref 1.7–7.7)
NEUTROS PCT: 83 %
Platelets: 473 10*3/uL — ABNORMAL HIGH (ref 150–400)
RBC: 4.6 MIL/uL (ref 3.87–5.11)
RDW: 16.5 % — ABNORMAL HIGH (ref 11.5–15.5)
WBC: 15.4 10*3/uL — ABNORMAL HIGH (ref 4.0–10.5)

## 2017-05-15 LAB — BASIC METABOLIC PANEL
ANION GAP: 11 (ref 5–15)
BUN: 17 mg/dL (ref 6–20)
CALCIUM: 8.8 mg/dL — AB (ref 8.9–10.3)
CHLORIDE: 98 mmol/L — AB (ref 101–111)
CO2: 21 mmol/L — AB (ref 22–32)
Creatinine, Ser: 0.85 mg/dL (ref 0.44–1.00)
GFR calc non Af Amer: >60 mL/min (ref >60)
Glucose, Bld: 110 mg/dL — ABNORMAL HIGH (ref 65–99)
POTASSIUM: 4.1 mmol/L (ref 3.5–5.1)
Sodium: 130 mmol/L — ABNORMAL LOW (ref 135–145)

## 2017-05-15 LAB — PROCALCITONIN: Procalcitonin: 0.18 ng/mL

## 2017-05-15 MED ORDER — FUROSEMIDE 40 MG PO TABS
40.0000 mg | ORAL_TABLET | Freq: Every day | ORAL | Status: DC
  Administered 2017-05-15 – 2017-05-17 (×3): 40 mg via ORAL
  Filled 2017-05-15 (×3): qty 1

## 2017-05-15 MED ORDER — ALUM & MAG HYDROXIDE-SIMETH 200-200-20 MG/5ML PO SUSP
30.0000 mL | ORAL | Status: DC | PRN
  Filled 2017-05-15 (×2): qty 30

## 2017-05-15 NOTE — Progress Notes (Addendum)
PROGRESS NOTE    [name]  QQV:[id number] DOB: 10-Oct-1940 DOA: 05/04/2017 PCP: Merrilee Seashore, MD   Brief Narrative: Patient is a 77 year old female with past medical history of coronary artery disease status post cardiac catheterization ,obesity, diabetes type 2, TIA, hypertension who presented for the evaluation of shortness of breath.  Imaging was suggestive of large pericardial effusion and  left pleural effusion.  Patient was admitted to cardiology service. She has been transferred to hospital service on 05/14/17. Patient has been started on Motrin, colchicine for recurrent pericarditis, pericardial effusion.  She also underwent thoracentesis for left-sided pleural effusion. She has ongoing chest pain, shortness of breath.She also has persistent fever.  Assessment & Plan:   Active Problems:   Essential hypertension, benign   Hyperlipemia   Hyperlipidemia   Morbid obesity (HCC)   SOB (shortness of breath)   Atypical chest pain   Pericardial effusion   Pericarditis   Cough   Pleural effusion on left   Bilateral pleural effusion   Hypoxemia  Acute pericarditis with pericardial effusion:  She had a history of pericarditis in the past.  Suspected to be viral.  ANA negative.  Elevated ESR and CRP.  Thyroid studies normal. 2D echocardiogram on 4/22 showed no increase in pericardial effusion size, no evidence of tamponade. Patient has been started on colchicine and Motrin which will be continued until she sees cardiology as an outpatient.  Plan is to repeat echocardiogram in 2 to 3 weeks. Patient complains of persistent  chest pain.  Troponins negative.  EKG did not show any acute changes. Continue PPI while on NSAIDs.  Acute dyspnea: Respiratory status stable today. Currently saturating fine on room air.  Last chest x-ray  showed trace left pleural effusion with patchy basilar opacity which is not a new finding.  She underwent thoracentesis for left-sided pleural effusion.   Pulmonology was following.  We will follow-up cytology. Continue bronchodilator treatment as necessary. Will start on lasix 40 mg daily.  Fever: Source unknown.  Could also be from pericarditis .resent  urine culture.  Blood cultures sent again.  Procalcitonin nonreassuring.  Lactate level normal.  Continue Zosyn.  Started on vancomycin too. She was just treated for UTI with Macrobid.  Patient denies any dysuria. Will discuss with ID tomorrow if she continues to be febrile. Patient has moderate leukocytosis.  Hypertension: Currently blood pressure stable.  Continue Cardizem.  Hyperlipidemia: Continue pravastatin.  Diabetes mellitus: Continue insulin, we will continue to monitor blood sugars.  Deconditioning/dizziness :PT following.  C might need to skilled nursing facility on discharge.  Episodes of staring: Neurology was following.  She is  on carbamazepine.Keppra discontinued. Neurology signed off.  Hyponatremia:Improved. We will continue to monitor.  Obstructive Sleep Apnea: Continue Cpap HS.   DVT prophylaxis: Hep [place] Code Status: Full Family Communication: Son present at the bed side Disposition Plan: Likely skilled nursing facility.  Pending PT evaluation   Consultants: Cardiology, pulmonary  Procedures: Thoracentesis  Antimicrobials: Zosyn, vancomycin  Subjective:  Patient seen and examined the bedside this morning.  As per discharge is much stable today.  Still complains of chest pain.  Febrile this morning. Objective: Vitals:   05/15/17 0406 05/15/17 0823 05/15/17 0833 05/15/17 1239  BP: (!) 152/75 (!) 141/65  (!) 125/54  Pulse:  (!) 112  77  Resp:      Temp:  (!) 103.1 F (39.5 C)  98.2 F (36.8 C)  TempSrc:  Oral  Oral  SpO2: 98% 92% 96% 94%  Weight:  99.7 kg (219 lb 12.8 oz)     Height:        Intake/Output Summary (Last 24 hours) at 05/15/2017 1254 Last data filed at 05/14/2017 1829 Gross per 24 hour  Intake 200 ml  Output -  Net 200 ml   Filed  Weights   05/12/17 0624 05/14/17 0444 05/15/17 0406  Weight: 99.8 kg (220 lb 1.6 oz) 99.8 kg (220 lb) 99.7 kg (219 lb 12.8 oz)    Examination:  General exam:Not in acute  distress,obese HEENT:PERRL,Oral mucosa moist, Ear/Nose normal on gross exam Respiratory system: Bilateral rigidity in the bases Cardiovascular system: S1 & S2 heard, RRR. No JVD, murmurs, rubs, gallops or clicks. Gastrointestinal system: Abdomen is nondistended, soft and nontender. No organomegaly or masses felt. Normal bowel sounds heard. Central nervous system: Alert and oriented. No focal neurological deficits. Extremities: Trace edema, no clubbing ,no cyanosis, distal peripheral pulses palpable. Skin: No rashes, lesions or ulcers,no icterus ,no pallor MSK: Normal muscle bulk,tone ,power Psychiatry: Judgement and insight appear normal. Mood & affect appropriate.      Data Reviewed: I have personally reviewed following labs and imaging studies  CBC: Recent Labs  Lab 05/08/17 1647 05/09/17 0505 05/12/17 0816 05/15/17 0736  WBC 12.6* 8.6 9.0 15.4*  NEUTROABS 9.5*  --  6.2 12.8*  HGB 10.7* 10.6* 12.0 11.6*  HCT 35.1* 34.3* 38.5 35.7*  MCV 80.1 79.4 80.0 77.6*  PLT 516* 538* 517* 480*   Basic Metabolic Panel: Recent Labs  Lab 05/09/17 0505 05/12/17 0816 05/14/17 1114 05/15/17 0535  NA 132* 136 129* 130*  K 4.2 4.4 4.1 4.1  CL 103 104 96* 98*  CO2 21* 22 22 21*  GLUCOSE 167* 166* 179* 110*  BUN 24* 13 13 17   CREATININE 1.05* 0.78 0.89 0.85  CALCIUM 8.3* 9.4 8.5* 8.8*   GFR: Estimated Creatinine Clearance: 63.4 mL/min (by C-G formula based on SCr of 0.85 mg/dL). Liver Function Tests: No results for input(s): AST, ALT, ALKPHOS, BILITOT, PROT, ALBUMIN in the last 168 hours. No results for input(s): LIPASE, AMYLASE in the last 168 hours. No results for input(s): AMMONIA in the last 168 hours. Coagulation Profile: No results for input(s): INR, PROTIME in the last 168 hours. Cardiac  Enzymes: Recent Labs  Lab 05/14/17 1114 05/14/17 1650 05/14/17 2231  TROPONINI <0.03 <0.03 <0.03   BNP (last 3 results) No results for input(s): PROBNP in the last 8760 hours. HbA1C: No results for input(s): HGBA1C in the last 72 hours. CBG: Recent Labs  Lab 05/14/17 1145 05/14/17 1653 05/14/17 2129 05/15/17 0731 05/15/17 1105  GLUCAP 160* 242* 120* 130* 189*   Lipid Profile: No results for input(s): CHOL, HDL, LDLCALC, TRIG, CHOLHDL, LDLDIRECT in the last 72 hours. Thyroid Function Tests: No results for input(s): TSH, T4TOTAL, FREET4, T3FREE, THYROIDAB in the last 72 hours. Anemia Panel: No results for input(s): VITAMINB12, FOLATE, FERRITIN, TIBC, IRON, RETICCTPCT in the last 72 hours. Sepsis Labs: Recent Labs  Lab 05/14/17 1114 05/14/17 1359 05/15/17 0535  PROCALCITON 0.17  --  0.18  LATICACIDVEN 1.2 1.1  --     Recent Results (from the past 240 hour(s))  Respiratory Panel by PCR     Status: None   Collection Time: 05/06/17  9:02 AM  Result Value Ref Range Status   Adenovirus NOT DETECTED NOT DETECTED Final   Coronavirus 229E NOT DETECTED NOT DETECTED Final   Coronavirus HKU1 NOT DETECTED NOT DETECTED Final   Coronavirus NL63 NOT DETECTED NOT DETECTED Final  Coronavirus OC43 NOT DETECTED NOT DETECTED Final   Metapneumovirus NOT DETECTED NOT DETECTED Final   Rhinovirus / Enterovirus NOT DETECTED NOT DETECTED Final   Influenza A NOT DETECTED NOT DETECTED Final   Influenza B NOT DETECTED NOT DETECTED Final   Parainfluenza Virus 1 NOT DETECTED NOT DETECTED Final   Parainfluenza Virus 2 NOT DETECTED NOT DETECTED Final   Parainfluenza Virus 3 NOT DETECTED NOT DETECTED Final   Parainfluenza Virus 4 NOT DETECTED NOT DETECTED Final   Respiratory Syncytial Virus NOT DETECTED NOT DETECTED Final   Bordetella pertussis NOT DETECTED NOT DETECTED Final   Chlamydophila pneumoniae NOT DETECTED NOT DETECTED Final   Mycoplasma pneumoniae NOT DETECTED NOT DETECTED Final     Comment: Performed at [facility], [place] [address], [place], [place] [address]  Culture, blood (routine x 2)     Status: None (Preliminary result)   Collection Time: 05/12/17  5:36 PM  Result Value Ref Range Status   Specimen Description BLOOD LEFT ANTECUBITAL  Final   Special Requests   Final    BOTTLES DRAWN AEROBIC AND ANAEROBIC Blood Culture adequate volume   Culture   Final    NO GROWTH 3 DAYS Performed at [facility], [place] [address], [place], [place] [address]    Report Status PENDING  Incomplete  Culture, blood (routine x 2)     Status: None (Preliminary result)   Collection Time: 05/12/17  5:36 PM  Result Value Ref Range Status   Specimen Description BLOOD LEFT FOREARM  Final   Special Requests   Final    BOTTLES DRAWN AEROBIC AND ANAEROBIC Blood Culture adequate volume   Culture   Final    NO GROWTH 3 DAYS Performed at [facility], [place] [address], [place], [place] [address]    Report Status PENDING  Incomplete  Culture, Urine     Status: Abnormal   Collection Time: 05/12/17  7:05 PM  Result Value Ref Range Status   Specimen Description URINE, CLEAN CATCH  Final   Special Requests   Final    NONE Performed at [facility], [place] [address], [place], [place] [address]    Culture MULTIPLE SPECIES PRESENT, SUGGEST RECOLLECTION (A)  Final   Report Status 05/13/2017 FINAL  Final  C difficile quick scan w PCR reflex     Status: None   Collection Time: 05/13/17  3:03 PM  Result Value Ref Range Status   C Diff antigen NEGATIVE NEGATIVE Final   C Diff toxin NEGATIVE NEGATIVE Final   C Diff interpretation No C. difficile detected.  Final  Acid Fast Smear (AFB)     Status: None   Collection Time: 05/13/17  3:50 PM  Result Value Ref Range Status   AFB Specimen Processing Concentration  Final   Acid Fast Smear Negative  Final    Comment: (NOTE) Performed At: [facility] [place], [place] [id number] Rush Farmer MD  IO:[id number]    Source (AFB) PLEURAL  Final    Comment: LEFT  Culture, blood (routine x 2)     Status: None (Preliminary result)   Collection Time: 05/14/17 11:30 AM  Result Value Ref Range Status   Specimen Description BLOOD RIGHT HAND  Final   Special Requests   Final    BOTTLES DRAWN AEROBIC AND ANAEROBIC Blood Culture results may not be optimal due to an inadequate volume of blood received in culture bottles   Culture   Final  NO GROWTH < 24 HOURS Performed at [place] [address] [place], [place] [address]    Report Status PENDING  Incomplete  Culture, blood (routine x 2)     Status: None (Preliminary result)   Collection Time: 05/14/17 11:35 AM  Result Value Ref Range Status   Specimen Description BLOOD RIGHT HAND  Final   Special Requests   Final    BOTTLES DRAWN AEROBIC AND ANAEROBIC Blood Culture adequate volume   Culture   Final    NO GROWTH < 24 HOURS Performed at [facility], [place] [address] [place], [place] [address]    Report Status PENDING  Incomplete         Radiology Studies: Dg Chest 1 View  Result Date: 05/13/2017 CLINICAL DATA:  Post thoracentesis EXAM: CHEST  1 VIEW COMPARISON:  05/13/2017, 05/11/2017 FINDINGS: Low lung volumes. Slight decreased left pleural effusion. No pneumothorax. Mild left basilar opacity. Cardiomegaly with vascular congestion. IMPRESSION: 1. Negative for pneumothorax 2. Trace left pleural effusion with patchy basilar opacity. Cardiomegaly with mild vascular congestion. Electronically Signed   By: Donavan Foil M.D.   On: 05/13/2017 16:14   Dg Chest Port 1 View  Result Date: 05/14/2017 CLINICAL DATA:  Fever. EXAM: PORTABLE CHEST 1 VIEW COMPARISON:  May 13, 2017 FINDINGS: Mild opacity and possible tiny effusion left base is identified. The heart, hila, mediastinum, lungs, and pleura are otherwise normal. IMPRESSION: Mild opacity and possible tiny effusion in the left base is identified. Recommend attention on  follow-up. No other acute abnormalities. Electronically Signed   By: Dorise Bullion III M.D   On: 05/14/2017 17:34        Scheduled Meds: . aspirin EC  81 mg Oral Daily  . carbamazepine  200 mg Oral BID  . chlorhexidine  15 mL Mouth Rinse BID  . colchicine  0.6 mg Oral BID  . diltiazem  360 mg Oral QHS  . donepezil  10 mg Oral QHS  . feeding supplement (GLUCERNA SHAKE)  237 mL Oral Q24H  . gabapentin  300 mg Oral BID  . heparin  5,000 Units Subcutaneous Q8H  . ibuprofen  800 mg Oral TID  . insulin aspart  0-5 Units Subcutaneous QHS  . insulin pump   Subcutaneous TID AC, HS, 0200  . ipratropium-albuterol  3 mL Nebulization BID  . levothyroxine  75 mcg Oral QAC breakfast  . mouth rinse  15 mL Mouth Rinse q12n4p  . montelukast  5 mg Oral QHS  . OLANZapine  5 mg Oral QHS  . pantoprazole  40 mg Oral Daily  . pravastatin  20 mg Oral Daily   Continuous Infusions: . piperacillin-tazobactam (ZOSYN)  IV 3.375 g (05/15/17 0853)  . vancomycin Stopped (05/15/17 0407)     LOS: 11 days    Time spent: 25 mins.More than 50% of that time was spent in counseling and/or coordination of care.      Shelly Coss, MD [facility] Pager [phone]  If 7PM-7AM, please contact night-coverage [email or web address] Password [facility] 05/15/2017, 12:54 PM

## 2017-05-16 ENCOUNTER — Encounter (HOSPITAL_COMMUNITY): Payer: Self-pay | Admitting: Radiology

## 2017-05-16 LAB — BASIC METABOLIC PANEL
Anion gap: 13 (ref 5–15)
BUN: 18 mg/dL (ref 6–20)
CO2: 20 mmol/L — ABNORMAL LOW (ref 22–32)
CREATININE: 0.93 mg/dL (ref 0.44–1.00)
Calcium: 9 mg/dL (ref 8.9–10.3)
Chloride: 98 mmol/L — ABNORMAL LOW (ref 101–111)
GFR calc Af Amer: >60 mL/min (ref >60)
GFR, EST NON AFRICAN AMERICAN: 58 mL/min — AB (ref >60)
GLUCOSE: 107 mg/dL — AB (ref 65–99)
POTASSIUM: 4.8 mmol/L (ref 3.5–5.1)
Sodium: 131 mmol/L — ABNORMAL LOW (ref 135–145)

## 2017-05-16 LAB — CBC WITH DIFFERENTIAL/PLATELET
Basophils Absolute: 0 10*3/uL (ref 0.0–0.1)
Basophils Relative: 0 %
EOS ABS: 0.2 10*3/uL (ref 0.0–0.7)
EOS PCT: 2 %
HCT: 33.7 % — ABNORMAL LOW (ref 36.0–46.0)
Hemoglobin: 10.7 g/dL — ABNORMAL LOW (ref 12.0–15.0)
LYMPHS ABS: 1.8 10*3/uL (ref 0.7–4.0)
LYMPHS PCT: 12 %
MCH: 24.8 pg — AB (ref 26.0–34.0)
MCHC: 31.8 g/dL (ref 30.0–36.0)
MCV: 78.2 fL (ref 78.0–100.0)
Monocytes Absolute: 1.7 10*3/uL — ABNORMAL HIGH (ref 0.1–1.0)
Monocytes Relative: 11 %
Neutro Abs: 11.6 10*3/uL — ABNORMAL HIGH (ref 1.7–7.7)
Neutrophils Relative %: 75 %
PLATELETS: 503 10*3/uL — AB (ref 150–400)
RBC: 4.31 MIL/uL (ref 3.87–5.11)
RDW: 16.6 % — ABNORMAL HIGH (ref 11.5–15.5)
WBC: 15.4 10*3/uL — ABNORMAL HIGH (ref 4.0–10.5)

## 2017-05-16 LAB — GLUCOSE, CAPILLARY
GLUCOSE-CAPILLARY: 191 mg/dL — AB (ref 65–99)
Glucose-Capillary: 134 mg/dL — ABNORMAL HIGH (ref 65–99)
Glucose-Capillary: 97 mg/dL (ref 65–99)
Glucose-Capillary: 128 mg/dL — ABNORMAL HIGH (ref 65–99)
Glucose-Capillary: 210 mg/dL — ABNORMAL HIGH (ref 65–99)
Glucose-Capillary: 133 mg/dL — ABNORMAL HIGH (ref 65–99)

## 2017-05-16 LAB — PROCALCITONIN: PROCALCITONIN: 0.21 ng/mL

## 2017-05-16 NOTE — Progress Notes (Signed)
Patient has home CPAP unit at bedside within reach. Patient places self on when ready.

## 2017-05-16 NOTE — Progress Notes (Signed)
PROGRESS NOTE    [name]  WPV:[id number] DOB: 07-07-1940 DOA: 05/04/2017 PCP: Merrilee Seashore, MD   Brief Narrative: Patient is a 77 year old female with past medical history of coronary artery disease status post cardiac catheterization ,obesity, diabetes type 2, TIA, hypertension who presented for the evaluation of shortness of breath.  Imaging was suggestive of large pericardial effusion and  left pleural effusion.  Patient was admitted to cardiology service. She has been transferred to hospital service on 05/14/17. Patient has been started on Motrin, colchicine for recurrent pericarditis, pericardial effusion.  She also underwent thoracentesis for left-sided pleural effusion. She has ongoing chest pain, shortness of breath.She also had persistent fever. Today she looked better.  Fever has resolved.  She was seen by physical therapy and recommended skilled nursing facility.. Plan is to discharge tomorrow to skilled nursing facility if she remains afebrile.  Assessment & Plan:   Active Problems:   Essential hypertension, benign   Hyperlipemia   Hyperlipidemia   Morbid obesity (HCC)   SOB (shortness of breath)   Atypical chest pain   Pericardial effusion   Pericarditis   Cough   Pleural effusion on left   Bilateral pleural effusion   Hypoxemia  Acute pericarditis with pericardial effusion:  She had a history of pericarditis in the past.  Suspected to be viral.  ANA negative.  Elevated ESR and CRP.  Thyroid studies normal. 2D echocardiogram on 4/22 showed no increase in pericardial effusion size, no evidence of tamponade. Patient has been started on colchicine and Motrin which will be continued until she sees cardiology as an outpatient.  Plan is to repeat echocardiogram in 2 to 3 weeks. Chest pain has improved today.  Troponins negative.  EKG did not show any acute changes. Continue PPI while on NSAIDs.  Acute dyspnea: Respiratory status stable today. Currently  saturating fine on room air.  Last chest x-ray  showed trace left pleural effusion with patchy basilar opacity which is not a new finding.  She underwent thoracentesis for left-sided pleural effusion.  Pulmonology was following.  We will follow-up cytology. Continue bronchodilator treatment as necessary. Started on lasix 40 mg daily.  Fever: Afebrile today.  Could also be from pericarditis .  Cultures no growth till date.  Procalcitonin nonreassuring.  Lactate level normal.  Continue Zosyn.  Started on vancomycin too.  We will continue antibiotics till tomorrow and if she remains afebrile ,we will discontinue them. She was just treated for UTI with Macrobid.  Patient denies any dysuria. Patient has moderate leukocytosis.  Hypertension: Currently blood pressure stable.  Continue Cardizem.  Hyperlipidemia: Continue pravastatin.  Diabetes mellitus: Continue insulin, we will continue to monitor blood sugars.  Deconditioning/dizziness :PT following. She will need skilled nursing facility on discharge.  Episodes of staring: Neurology was following.  She is  on carbamazepine.Keppra discontinued. Neurology signed off.  Hyponatremia:Improved. We will continue to monitor.  Obstructive Sleep Apnea: Continue Cpap HS.   DVT prophylaxis: Hep [place] Code Status: Full Family Communication: Son present at the bed side Disposition Plan: Likely skilled nursing facility.  Pending PT evaluation   Consultants: Cardiology, pulmonary  Procedures: Thoracentesis  Antimicrobials: Zosyn, vancomycin  Subjective:  Patient seen and examined at bedside this morning.  Remains comfortable today.  Afebrile today.  Chest pain has improved .  objective: Vitals:   05/16/17 0000 05/16/17 0345 05/16/17 0621 05/16/17 1405  BP:   (!) 113/52   Pulse: 79 80 (!) 104 (!) 101  Resp:   19 18  Temp:   99.7 F (37.6 C) 98.9 F (37.2 C)  TempSrc:   Oral Oral  SpO2: 92% 91% 93%   Weight:   104.3 kg (229 lb 15 oz)     Height:        Intake/Output Summary (Last 24 hours) at 05/16/2017 1517 Last data filed at 05/16/2017 1442 Gross per 24 hour  Intake 680 ml  Output -  Net 680 ml   Filed Weights   05/14/17 0444 05/15/17 0406 05/16/17 0621  Weight: 99.8 kg (220 lb) 99.7 kg (219 lb 12.8 oz) 104.3 kg (229 lb 15 oz)    Examination:  General exam:Not in acute  distress,obese HEENT:PERRL,Oral mucosa moist, Ear/Nose normal on gross exam Respiratory system: Bilateral rigidity in the bases Cardiovascular system: S1 & S2 heard, RRR. No JVD, murmurs, rubs, gallops or clicks. Gastrointestinal system: Abdomen is nondistended, soft and nontender. No organomegaly or masses felt. Normal bowel sounds heard. Central nervous system: Alert and oriented. No focal neurological deficits. Extremities: Trace edema, no clubbing ,no cyanosis, distal peripheral pulses palpable. Skin: No rashes, lesions or ulcers,no icterus ,no pallor MSK: Normal muscle bulk,tone ,power Psychiatry: Judgement and insight appear normal. Mood & affect appropriate.      Data Reviewed: I have personally reviewed following labs and imaging studies  CBC: Recent Labs  Lab 05/12/17 0816 05/15/17 0736 05/16/17 0557  WBC 9.0 15.4* 15.4*  NEUTROABS 6.2 12.8* 11.6*  HGB 12.0 11.6* 10.7*  HCT 38.5 35.7* 33.7*  MCV 80.0 77.6* 78.2  PLT 517* 473* 440*   Basic Metabolic Panel: Recent Labs  Lab 05/12/17 0816 05/14/17 1114 05/15/17 0535 05/16/17 0440  NA 136 129* 130* 131*  K 4.4 4.1 4.1 4.8  CL 104 96* 98* 98*  CO2 22 22 21* 20*  GLUCOSE 166* 179* 110* 107*  BUN _0 CREATININE 0.78 0.89 0.85 0.93  CALCIUM 9.4 8.5* 8.8* 9.0   GFR: Estimated Creatinine Clearance: 59.5 mL/min (by C-G formula based on SCr of 0.93 mg/dL). Liver Function Tests: No results for input(s): AST, ALT, ALKPHOS, BILITOT, PROT, ALBUMIN in the last 168 hours. No results for input(s): LIPASE, AMYLASE in the last 168 hours. No results for input(s):  AMMONIA in the last 168 hours. Coagulation Profile: No results for input(s): INR, PROTIME in the last 168 hours. Cardiac Enzymes: Recent Labs  Lab 05/14/17 1114 05/14/17 1650 05/14/17 2231  TROPONINI <0.03 <0.03 <0.03   BNP (last 3 results) No results for input(s): PROBNP in the last 8760 hours. HbA1C: No results for input(s): HGBA1C in the last 72 hours. CBG: Recent Labs  Lab 05/15/17 1626 05/15/17 2048 05/16/17 0330 05/16/17 0749 05/16/17 1115  GLUCAP 146* 128* 97 128* 210*   Lipid Profile: No results for input(s): CHOL, HDL, LDLCALC, TRIG, CHOLHDL, LDLDIRECT in the last 72 hours. Thyroid Function Tests: No results for input(s): TSH, T4TOTAL, FREET4, T3FREE, THYROIDAB in the last 72 hours. Anemia Panel: No results for input(s): VITAMINB12, FOLATE, FERRITIN, TIBC, IRON, RETICCTPCT in the last 72 hours. Sepsis Labs: Recent Labs  Lab 05/14/17 1114 05/14/17 1359 05/15/17 0535 05/16/17 0557  PROCALCITON 0.17  --  0.18 0.21  LATICACIDVEN 1.2 1.1  --   --     Recent Results (from the past 240 hour(s))  Culture, blood (routine x 2)     Status: None (Preliminary result)   Collection Time: 05/12/17  5:36 PM  Result Value Ref Range Status   Specimen Description BLOOD LEFT ANTECUBITAL  Final   Special  Requests   Final    BOTTLES DRAWN AEROBIC AND ANAEROBIC Blood Culture adequate volume   Culture   Final    NO GROWTH 4 DAYS Performed at [facility], [place] [address], [place], [place] [address]    Report Status PENDING  Incomplete  Culture, blood (routine x 2)     Status: None (Preliminary result)   Collection Time: 05/12/17  5:36 PM  Result Value Ref Range Status   Specimen Description BLOOD LEFT FOREARM  Final   Special Requests   Final    BOTTLES DRAWN AEROBIC AND ANAEROBIC Blood Culture adequate volume   Culture   Final    NO GROWTH 4 DAYS Performed at [facility], [place] [address] [place], [place] [address]    Report Status PENDING  Incomplete   Culture, Urine     Status: Abnormal   Collection Time: 05/12/17  7:05 PM  Result Value Ref Range Status   Specimen Description URINE, CLEAN CATCH  Final   Special Requests   Final    NONE Performed at [facility], [place] [address] [place], [place] [address]    Culture MULTIPLE SPECIES PRESENT, SUGGEST RECOLLECTION (A)  Final   Report Status 05/13/2017 FINAL  Final  C difficile quick scan w PCR reflex     Status: None   Collection Time: 05/13/17  3:03 PM  Result Value Ref Range Status   C Diff antigen NEGATIVE NEGATIVE Final   C Diff toxin NEGATIVE NEGATIVE Final   C Diff interpretation No C. difficile detected.  Final  Acid Fast Smear (AFB)     Status: None   Collection Time: 05/13/17  3:50 PM  Result Value Ref Range Status   AFB Specimen Processing Concentration  Final   Acid Fast Smear Negative  Final    Comment: (NOTE) Performed At: [facility] [place], [place] [id number] Rush Farmer MD DX:[id number]    Source (AFB) PLEURAL  Final    Comment: LEFT  Culture, blood (routine x 2)     Status: None (Preliminary result)   Collection Time: 05/14/17 11:30 AM  Result Value Ref Range Status   Specimen Description BLOOD RIGHT HAND  Final   Special Requests   Final    BOTTLES DRAWN AEROBIC AND ANAEROBIC Blood Culture results may not be optimal due to an inadequate volume of blood received in culture bottles   Culture   Final    NO GROWTH 2 DAYS Performed at [facility], [place] [address], [place], [place] [address]    Report Status PENDING  Incomplete  Culture, blood (routine x 2)     Status: None (Preliminary result)   Collection Time: 05/14/17 11:35 AM  Result Value Ref Range Status   Specimen Description BLOOD RIGHT HAND  Final   Special Requests   Final    BOTTLES DRAWN AEROBIC AND ANAEROBIC Blood Culture adequate volume   Culture   Final    NO GROWTH 2 DAYS Performed at [facility], [place] [address] [place],  [address]     Report Status PENDING  Incomplete         Radiology Studies: No results found.      Scheduled Meds: . aspirin EC  81 mg Oral Daily  . carbamazepine  200 mg Oral BID  . chlorhexidine  15 mL Mouth Rinse BID  . colchicine  0.6 mg Oral BID  . diltiazem  360 mg Oral QHS  .  donepezil  10 mg Oral QHS  . feeding supplement (GLUCERNA SHAKE)  237 mL Oral Q24H  . furosemide  40 mg Oral Daily  . gabapentin  300 mg Oral BID  . heparin  5,000 Units Subcutaneous Q8H  . ibuprofen  800 mg Oral TID  . insulin aspart  0-5 Units Subcutaneous QHS  . insulin pump   Subcutaneous TID AC, HS, 0200  . levothyroxine  75 mcg Oral QAC breakfast  . mouth rinse  15 mL Mouth Rinse q12n4p  . montelukast  5 mg Oral QHS  . OLANZapine  5 mg Oral QHS  . pantoprazole  40 mg Oral Daily  . pravastatin  20 mg Oral Daily   Continuous Infusions: . piperacillin-tazobactam (ZOSYN)  IV Stopped (05/16/17 1459)  . vancomycin 750 mg (05/16/17 1454)     LOS: 12 days    Time spent: 25 mins.More than 50% of that time was spent in counseling and/or coordination of care.      Shelly Coss, MD [facility] Pager [phone]  If 7PM-7AM, please contact night-coverage [email or web address] Password [facility] 05/16/2017, 3:17 PM

## 2017-05-16 NOTE — Progress Notes (Signed)
CSW continuing to follow for disposition planning. Per MD, patient not yet medically stable. CSW to follow and confirm SNF bed availability when patient closer to discharge.  Abigail Butts, LCSWA [phone]

## 2017-05-16 NOTE — Progress Notes (Signed)
SATURATION QUALIFICATIONS: (This note is used to comply with regulatory documentation for home oxygen)  Patient Saturations on Room Air at Rest = 90%  Patient Saturations on Room Air while Ambulating = 85%  Patient Saturations on 3 Liters of oxygen while Ambulating = 91%  Please briefly explain why patient needs home oxygen:Pt requiring 3LO2 to keep sats >90%.  Thanks.  [facility] Acute Rehabilitation [phone] (pager)

## 2017-05-16 NOTE — Progress Notes (Signed)
Physical Therapy Treatment Patient Details Name: [name] MRN: [id number] DOB: June 26, 1940 Today's Date: 05/16/2017    History of Present Illness Pt adm with large pericardial effusion and bilateral pleural effusions. Pt found to have acute percarditis. PMH - CAD, HTN, TIA, obesity, DM, lymphadema, vertigo    PT Comments    Pt admitted with above diagnosis. Pt currently with functional limitations due to balance and endurance deficits. Pt was able to ambulate with RW in hallway. Needs a lot of encouragement.  Desats on RA and needed 3LO2 to keep sats >90%.  Very poor endurance.   Pt will benefit from skilled PT to increase their independence and safety with mobility to allow discharge to the venue listed below.    SATURATION QUALIFICATIONS: (This note is used to comply with regulatory documentation for home oxygen)  Patient Saturations on Room Air at Rest = 90%  Patient Saturations on Room Air while Ambulating = 85%  Patient Saturations on 3 Liters of oxygen while Ambulating = 91%  Please briefly explain why patient needs home oxygen:Pt requiring 3LO2 to keep sats >90%.    Follow Up Recommendations  SNF     Equipment Recommendations  Rolling walker with 5" wheels    Recommendations for Other Services       Precautions / Restrictions Precautions Precautions: Fall Restrictions Weight Bearing Restrictions: No    Mobility  Bed Mobility Overal bed mobility: Needs Assistance Bed Mobility: Supine to Sit     Supine to sit: Min assist     General bed mobility comments: Needed some assist to elevate trunk.   Transfers Overall transfer level: Needs assistance Equipment used: Rolling walker (2 wheeled) Transfers: Sit to/from Stand Sit to Stand: Min assist         General transfer comment: Assist to power up to bring hips up and for balance. Pt asked to use 3N1 first.  Assisted to 3N1 with min assist.  Pt wiped herself and then stated she had to sit back down and  rest.  Once she rested then pt ambulated as below.    Ambulation/Gait Ambulation/Gait assistance: Min assist;+2 safety/equipment Ambulation Distance (Feet): 110 Feet Assistive device: Rolling walker (2 wheeled) Gait Pattern/deviations: Step-through pattern;Decreased step length - right;Decreased step length - left;Shuffle Gait velocity: decr Gait velocity interpretation: <1.8 ft/sec, indicate of risk for recurrent falls General Gait Details: Assist for balance and encouragement to continue. Pt c/o fatigue throughout and needs to be pushed to continue.  BP stable.  Of note, her O2 sats did decr to 85% once she began walking, therefore needed 3L to stay >90%.   HR 109-118 bpm   Stairs             Wheelchair Mobility    Modified Rankin (Stroke Patients Only)       Balance Overall balance assessment: Needs assistance Sitting-balance support: No upper extremity supported;Feet supported Sitting balance-Leahy Scale: Fair     Standing balance support: Bilateral upper extremity supported Standing balance-Leahy Scale: Poor Standing balance comment: walker and min assist for static standing                            Cognition Arousal/Alertness: Awake/alert Behavior During Therapy: Flat affect Overall Cognitive Status: Impaired/Different from baseline Area of Impairment: Problem solving                             Problem Solving:  Slow processing;Requires verbal cues General Comments: Pt needs encouragement to participate      Exercises      General Comments        Pertinent Vitals/Pain Pain Assessment: No/denies pain    Home Living                      Prior Function            PT Goals (current goals can now be found in the care plan section) Acute Rehab PT Goals Patient Stated Goal: Not stated Progress towards PT goals: Progressing toward goals    Frequency    Min 3X/week      PT Plan Current plan remains  appropriate    Co-evaluation              AM-PAC PT "6 Clicks" Daily Activity  Outcome Measure  Difficulty turning over in bed (including adjusting bedclothes, sheets and blankets)?: A Little Difficulty moving from lying on back to sitting on the side of the bed? : Unable Difficulty sitting down on and standing up from a chair with arms (e.g., wheelchair, bedside commode, etc,.)?: A Little Help needed moving to and from a bed to chair (including a wheelchair)?: A Little Help needed walking in hospital room?: A Little Help needed climbing 3-5 steps with a railing? : A Lot 6 Click Score: 15    End of Session Equipment Utilized During Treatment: Gait belt;Oxygen Activity Tolerance: Patient limited by fatigue Patient left: in chair;with call bell/phone within reach;with chair alarm set;with family/visitor present Nurse Communication: Mobility status PT Visit Diagnosis: Unsteadiness on feet (R26.81);Other abnormalities of gait and mobility (R26.89);History of falling (Z91.81);Dizziness and giddiness (R42)     Time: 1610-9604 PT Time Calculation (min) (ACUTE ONLY): 25 min  Charges:  $Gait Training: 8-22 mins $Therapeutic Activity: 8-22 mins                    G Codes:       Axten Pascucci,PT Acute Rehabilitation [phone] (pager)    Berline Lopes 05/16/2017, 1:14 PM

## 2017-05-17 ENCOUNTER — Ambulatory Visit: Payer: Medicare Other | Admitting: Physician Assistant

## 2017-05-17 LAB — CBC WITH DIFFERENTIAL/PLATELET
BASOS PCT: 0 %
Basophils Absolute: 0 10*3/uL (ref 0.0–0.1)
Eosinophils Absolute: 0.2 10*3/uL (ref 0.0–0.7)
Eosinophils Relative: 2 %
HEMATOCRIT: 36 % (ref 36.0–46.0)
HEMOGLOBIN: 11.2 g/dL — AB (ref 12.0–15.0)
LYMPHS ABS: 1.6 10*3/uL (ref 0.7–4.0)
LYMPHS PCT: 11 %
MCH: 24.3 pg — ABNORMAL LOW (ref 26.0–34.0)
MCHC: 31.1 g/dL (ref 30.0–36.0)
MCV: 78.1 fL (ref 78.0–100.0)
MONO ABS: 1.5 10*3/uL — AB (ref 0.1–1.0)
MONOS PCT: 11 %
NEUTROS ABS: 10.4 10*3/uL — AB (ref 1.7–7.7)
NEUTROS PCT: 76 %
Platelets: 444 10*3/uL — ABNORMAL HIGH (ref 150–400)
RBC: 4.61 MIL/uL (ref 3.87–5.11)
RDW: 16.7 % — AB (ref 11.5–15.5)
WBC: 13.7 10*3/uL — ABNORMAL HIGH (ref 4.0–10.5)

## 2017-05-17 LAB — CULTURE, BLOOD (ROUTINE X 2)
CULTURE: NO GROWTH
CULTURE: NO GROWTH
SPECIAL REQUESTS: ADEQUATE
SPECIAL REQUESTS: ADEQUATE

## 2017-05-17 LAB — GLUCOSE, CAPILLARY
GLUCOSE-CAPILLARY: 171 mg/dL — AB (ref 65–99)
Glucose-Capillary: 141 mg/dL — ABNORMAL HIGH (ref 65–99)
Glucose-Capillary: 217 mg/dL — ABNORMAL HIGH (ref 65–99)

## 2017-05-17 MED ORDER — METOPROLOL TARTRATE 25 MG PO TABS: 12.5000 mg | ORAL_TABLET | Freq: Two times a day (BID) | ORAL | 0 refills | Status: DC

## 2017-05-17 MED ORDER — FUROSEMIDE 40 MG PO TABS: 40.0000 mg | ORAL_TABLET | Freq: Every day | ORAL | 0 refills | Status: DC

## 2017-05-17 MED ORDER — ASPIRIN 81 MG PO TBEC: 81.0000 mg | DELAYED_RELEASE_TABLET | Freq: Every day | ORAL | 0 refills | Status: DC

## 2017-05-17 MED ORDER — DILTIAZEM HCL ER COATED BEADS 360 MG PO CP24: 360.0000 mg | ORAL_CAPSULE | Freq: Every day | ORAL | 0 refills | Status: DC

## 2017-05-17 MED ORDER — METOPROLOL TARTRATE 12.5 MG HALF TABLET
12.5000 mg | ORAL_TABLET | Freq: Two times a day (BID) | ORAL | Status: DC
  Administered 2017-05-17: 12.5 mg via ORAL
  Filled 2017-05-17: qty 1

## 2017-05-17 MED ORDER — IBUPROFEN 800 MG PO TABS: 800.0000 mg | ORAL_TABLET | Freq: Three times a day (TID) | ORAL | 0 refills | Status: DC

## 2017-05-17 MED ORDER — COLCHICINE 0.6 MG PO TABS: 0.6000 mg | ORAL_TABLET | Freq: Two times a day (BID) | ORAL | 0 refills | Status: DC

## 2017-05-17 MED ORDER — PANTOPRAZOLE SODIUM 40 MG PO TBEC: 40.0000 mg | DELAYED_RELEASE_TABLET | Freq: Every day | ORAL | 0 refills | Status: DC

## 2017-05-17 MED ORDER — MONTELUKAST SODIUM 5 MG PO CHEW: 5.0000 mg | CHEWABLE_TABLET | Freq: Every day | ORAL | 0 refills | Status: DC

## 2017-05-17 NOTE — Progress Notes (Signed)
Per MD this morning, patient medically ready for discharge. CSW spoke to admissions at [facility], patient's preferred SNF - they do not have a bed available for patient today. CSW spoke to patient's son Tawanna Cooler at bedside, and [place] via phone. Patient's sons agreeable to second choice facility, [facility].   CSW confirmed bed at [facility] and [facility] now has [facility] authorization for patient to admit to facility today. Son, Joni Fears to complete admissions paperwork at the facility this afternoon.  Paged MD. CSW to support with discharge to SNF today.  Abigail Butts, LCSWA [phone]

## 2017-05-17 NOTE — Clinical Social Work Placement (Signed)
   CLINICAL SOCIAL WORK PLACEMENT  NOTE  Date:  05/17/2017  Patient Details  Name: [name] MRN: [id number] Date of Birth: 01-Sep-1940  Clinical Social Work is seeking post-discharge placement for this patient at the Skilled  Nursing Facility level of care (*CSW will initial, date and re-position this form in  chart as items are completed):  Yes   Patient/family provided with [facility] Clinical Social Work Department's list of facilities offering this level of care within the geographic area requested by the patient (or if unable, by the patient's family).  Yes   Patient/family informed of their freedom to choose among providers that offer the needed level of care, that participate in Medicare, Medicaid or managed care program needed by the patient, have an available bed and are willing to accept the patient.  Yes   Patient/family informed of [facility]'s ownership interest in [facility] and [facility], as well as of the fact that they are under no obligation to receive care at these facilities.  PASRR submitted to EDS on 05/10/17     PASRR number received on 05/10/17     Existing PASRR number confirmed on       FL2 transmitted to all facilities in geographic area requested by pt/family on 05/10/17     FL2 transmitted to all facilities within larger geographic area on       Patient informed that his/her managed care company has contracts with or will negotiate with certain facilities, including the following:  [facility]     Yes   Patient/family informed of bed offers received.  Patient chooses bed at [facility]     Physician recommends and patient chooses bed at      Patient to be transferred to [facility] on 05/17/17.  Patient to be transferred to facility by [facility]     Patient family notified on 05/17/17 of transfer.  Name of family member notified:  [name] and Annalysse Shoemaker, sons     PHYSICIAN Please  prepare priority discharge summary, including medications, Please prepare prescriptions     Additional Comment:    _______________________________________________ Abigail Butts, LCSW 05/17/2017, 1:38 PM

## 2017-05-17 NOTE — Plan of Care (Signed)
Pt discharged to SNF. Discharge instructions given to son and pt

## 2017-05-17 NOTE — Care Management Important Message (Signed)
Important Message  Patient Details  Name: [name] MRN: [id number] Date of Birth: 1940/05/16   Medicare Important Message Given:  Yes    Dorena Bodo 05/17/2017, 12:35 PM

## 2017-05-17 NOTE — Discharge Summary (Addendum)
Physician Discharge Summary  [name]:[id number] DOB: 04-29-1940 DOA: 05/04/2017  PCP: Merrilee Seashore, MD  Admit date: 05/04/2017 Discharge date: 05/17/2017  Admitted From: Home Disposition:  SNF  Discharge Condition:Stable CODE STATUS:FULL Diet recommendation: Heart Healthy  Brief/Interim Summary: Patient is a 77 year old female with past medical history of coronary artery disease status post cardiac catheterization ,obesity, diabetes type 2, TIA, hypertension who presented for the evaluation of shortness of breath.  Imaging was suggestive of large pericardial effusion and  left pleural effusion.  Patient was admitted to cardiology service. She has been transferred to hospital service on 05/14/17. Patient has been started on Motrin, colchicine for recurrent pericarditis, pericardial effusion.  She also underwent thoracentesis for left-sided pleural effusion. She had ongoing chest pain, shortness of breath.She also had persistent fever. Since last 2 days , she looked much better.  Fever has resolved.  Chest pain and dyspnea have improved significantly.  She was seen by physical therapy and recommended skilled nursing facility. Plan is to discharge today to skilled nursing facility.  Following problems were  addressed during her hospitalization:  Acute pericarditis with pericardial effusion:  She had a history of pericarditis in the past.  Suspected to be viral.  ANA negative.  Elevated ESR and CRP.  Thyroid studies normal. 2D echocardiogram on 4/22 showed no increase in pericardial effusion size, no evidence of tamponade. Patient has been started on colchicine and Motrin which will be continued until she sees cardiology as an outpatient.  Plan is to repeat echocardiogram in 2 to 3 weeks. Chest pain has improved today.  Troponins negative.  EKG did not show any acute changes. Continue PPI while on NSAIDs.  Acute dyspnea: Respiratory status stable today. Currently  saturating fine on room air.  Last chest x-ray  showed trace left pleural effusion with patchy basilar opacity which is not a new finding.  She underwent thoracentesis for left-sided pleural effusion.  Pulmonology was following.  We will follow-up cytology. Continue bronchodilator treatment as necessary. Started on lasix 40 mg daily.  Fever: Afebrile today.  Likely  from pericarditis .  Cultures no growth till date.  Procalcitonin nonreassuring.  Lactate level normal.Antibiotics discontinued. She was just treated for UTI with Macrobid.  Patient denies any dysuria. Patient has mild leukocytosis.  Follow-up CBC in a week.  Hypertension: Currently blood pressure stable.  Continue Cardizem.  Low-dose metoprolol added today for sinus tachycardia.  Hyperlipidemia: Continue pravastatin.  Diabetes mellitus: Continue her home regimen.  Deconditioning/dizziness :PT following. She will need skilled nursing facility on discharge.  Episodes of staring: Neurology was following.  She is  on carbamazepine.Keppra discontinued. Neurology signed off.  Hyponatremia:Improved. We will continue to monitor.  Obstructive Sleep Apnea: Continue Cpap HS.     Discharge Diagnoses:  Active Problems:   Essential hypertension, benign   Hyperlipemia   Hyperlipidemia   Morbid obesity (HCC)   SOB (shortness of breath)   Atypical chest pain   Pericardial effusion   Pericarditis   Cough   Pleural effusion on left   Bilateral pleural effusion   Hypoxemia    Discharge Instructions  Discharge Instructions    Diet - low sodium heart healthy   Complete by:  As directed    Discharge instructions   Complete by:  As directed    1) Please follow-up with your cardiologist in 2 weeks.  You may need to get an echocardiogram in 2 weeks during the follow-up. 2) Take prescribed medications as instructed. 3) Follow-up with your PCP  in a week.  Do a CBC and BMP test during  the follow-up.   Increase activity  slowly   Complete by:  As directed      Allergies as of 05/17/2017      Reactions   Flagyl [metronidazole Hcl] Itching, Rash   Ciprofloxacin Itching, Rash   Metformin And Related Rash   Milk-related Compounds Other (See Comments)   Stomach pains   Other Other (See Comments)   [place] nuts cause severe facial redness      Medication List    STOP taking these medications   aspirin 325 MG tablet Replaced by:  aspirin 81 MG EC tablet   esomeprazole 40 MG capsule Commonly known as:  NEXIUM Replaced by:  pantoprazole 40 MG tablet   spironolactone 50 MG tablet Commonly known as:  ALDACTONE     TAKE these medications   aspirin 81 MG EC tablet Take 1 tablet (81 mg total) by mouth daily. Start taking on:  05/18/2017 Replaces:  aspirin 325 MG tablet   betamethasone dipropionate 0.05 % cream Commonly known as:  DIPROLENE Apply 1 application topically 2 (two) times daily as needed (rash).   carbamazepine 200 MG 12 hr capsule Commonly known as:  CARBATROL Take 200 mg by mouth 2 (two) times daily.   clobetasol 0.05 % Gel Commonly known as:  TEMOVATE Apply 1 application topically 2 (two) times daily as needed (rash).   colchicine 0.6 MG tablet Take 1 tablet (0.6 mg total) by mouth 2 (two) times daily.   diltiazem 360 MG 24 hr capsule Commonly known as:  CARDIZEM CD Take 1 capsule (360 mg total) by mouth at bedtime. What changed:    medication strength  how much to take   donepezil 10 MG tablet Commonly known as:  ARICEPT Take 10 mg by mouth at bedtime.   folic acid 1 MG tablet Commonly known as:  FOLVITE Take 1 mg by mouth daily.   furosemide 40 MG tablet Commonly known as:  LASIX Take 1 tablet (40 mg total) by mouth daily. Start taking on:  05/18/2017   gabapentin 300 MG capsule Commonly known as:  NEURONTIN Take 300 mg by mouth 2 (two) times daily.   HUMALOG 100 UNIT/ML injection Generic drug:  insulin lispro INJECT 0.78ML (78UNITS) INTO THE SKIN VIA V-GO  PUMP AS DIRECTED   ibuprofen 800 MG tablet Commonly known as:  ADVIL,MOTRIN Take 1 tablet (800 mg total) by mouth 3 (three) times daily.   INVOKANA 300 MG Tabs tablet Generic drug:  canagliflozin TAKE ONE TABLET BY MOUTH DAILY BEFORE BREAKFAST   levothyroxine 75 MCG tablet Commonly known as:  SYNTHROID, LEVOTHROID Take 75 mcg by mouth daily before breakfast.   liraglutide 18 MG/3ML Sopn Commonly known as:  VICTOZA Inject 0.3 mLs (1.8 mg total) into the skin daily.   methotrexate 2.5 MG tablet Commonly known as:  RHEUMATREX Take 7.5 mg by mouth every Monday. For rash   metoprolol tartrate 25 MG tablet Commonly known as:  LOPRESSOR Take 0.5 tablets (12.5 mg total) by mouth 2 (two) times daily.   montelukast 5 MG chewable tablet Commonly known as:  SINGULAIR Chew 1 tablet (5 mg total) by mouth at bedtime.   OLANZapine 5 MG tablet Commonly known as:  ZYPREXA Take 5 mg by mouth at bedtime.   ondansetron 8 MG tablet Commonly known as:  ZOFRAN Take 1 tablet (8 mg total) by mouth every 8 (eight) hours as needed for nausea.   pantoprazole 40 MG  tablet Commonly known as:  PROTONIX Take 1 tablet (40 mg total) by mouth daily. Start taking on:  05/18/2017 Replaces:  esomeprazole 40 MG capsule   pravastatin 20 MG tablet Commonly known as:  PRAVACHOL Take 20 mg by mouth daily.   V-GO 40 Kit USE ONCE DAILY AS DIRECTED What changed:    when to take this  additional instructions   Vitamin D 2000 units tablet Take 2,000 Units by mouth daily with breakfast.       Contact information for follow-up providers    Croitoru, Mihai, MD. Schedule an appointment as soon as possible for a visit in 2 week(s).   Specialty:  Cardiology Contact information: [address] [place] [address] [phone]        Merrilee Seashore, MD. Schedule an appointment as soon as possible for a visit in 1 week(s).   Specialty:  Internal Medicine Contact information: [address] [place] [address] [phone]            Contact information for after-discharge care    Destination    [facility] .   Service:  Skilled Nursing Contact information: [place] [phone]                 Allergies  Allergen Reactions  . Flagyl [Metronidazole Hcl] Itching and Rash  . Ciprofloxacin Itching and Rash  . Metformin And Related Rash  . Milk-Related Compounds Other (See Comments)    Stomach pains  . Other Other (See Comments)    [place] nuts cause severe facial redness    Consultations:  Cardiology   Procedures/Studies: Dg Chest 1 View  Result Date: 05/13/2017 CLINICAL DATA:  Post thoracentesis EXAM: CHEST  1 VIEW COMPARISON:  05/13/2017, 05/11/2017 FINDINGS: Low lung volumes. Slight decreased left pleural effusion. No pneumothorax. Mild left basilar opacity. Cardiomegaly with vascular congestion. IMPRESSION: 1. Negative for pneumothorax 2. Trace left pleural effusion with patchy basilar opacity. Cardiomegaly with mild vascular congestion. Electronically Signed   By: Donavan Foil M.D.   On: 05/13/2017 16:14   Dg Chest 2 View  Result Date: 05/11/2017 CLINICAL DATA:  Shortness of breath EXAM: CHEST - 2 VIEW COMPARISON:  05/08/2017 FINDINGS: Continued moderate layering left pleural effusion. Left lower lobe atelectasis or pneumonia is stable. Right lung clear. Heart is borderline in size. No acute bony abnormality. IMPRESSION: Layering left effusion with left lower lobe atelectasis or pneumonia, unchanged. Electronically Signed   By: Rolm Baptise M.D.   On: 05/11/2017 12:53   Dg Chest 2 View  Result Date: 05/04/2017 CLINICAL DATA:  BILATERAL pleural effusions. EXAM: CHEST - 2 VIEW COMPARISON:  CT chest 04/24/2017. FINDINGS: Increased cardiac silhouette reflects pericardial effusion. Large LEFT pleural effusion with compressive atelectasis. Small RIGHT effusion best  visualized on the lateral view. No definite consolidation. No pneumothorax. No osseous findings. IMPRESSION: Pericardial effusion and BILATERAL pleural effusions as described, reflect the findings noted on recent CT. Electronically Signed   By: Staci Righter M.D.   On: 05/04/2017 16:57   Ct Angio Chest Pe W Or Wo Contrast  Result Date: 05/04/2017 CLINICAL DATA:  Severe shortness of breath beginning 04/21/2017. EXAM: CT ANGIOGRAPHY CHEST WITH CONTRAST TECHNIQUE: Multidetector CT imaging of the chest was performed using the standard protocol during bolus administration of intravenous contrast. Multiplanar CT image reconstructions and MIPs were obtained to evaluate the vascular anatomy. CONTRAST:  65m ISOVUE-370 IOPAMIDOL (ISOVUE-370) INJECTION 76% COMPARISON:  Radiography  03/26/2017. FINDINGS: Cardiovascular: Pulmonary arterial opacification is excellent. There are no pulmonary emboli. There is aortic atherosclerosis but no aneurysm. No coronary artery calcification is seen. Heart size is normal. There is a large pericardial effusion. Mediastinum/Nodes: No mass or lymphadenopathy. Lungs/Pleura: Bilateral pleural effusions, moderate size on the left and small on the right, layering dependently with dependent pulmonary atelectasis. Upper Abdomen: Negative.  Previous cholecystectomy. Musculoskeletal: Minimal spinal degenerative changes. No significant bone finding. Review of the MIP images confirms the above findings. IMPRESSION: No pulmonary emboli. Large pericardial effusion. Moderate sized left pleural effusion and small right pleural effusion layering dependently with associated dependent pulmonary atelectasis. Aortic atherosclerosis. Electronically Signed   By: Nelson Chimes M.D.   On: 05/04/2017 15:12   Dg Chest Port 1 View  Result Date: 05/14/2017 CLINICAL DATA:  Fever. EXAM: PORTABLE CHEST 1 VIEW COMPARISON:  May 13, 2017 FINDINGS: Mild opacity and possible tiny effusion left base is identified. The  heart, hila, mediastinum, lungs, and pleura are otherwise normal. IMPRESSION: Mild opacity and possible tiny effusion in the left base is identified. Recommend attention on follow-up. No other acute abnormalities. Electronically Signed   By: Dorise Bullion III M.D   On: 05/14/2017 17:34   Dg Chest Port 1 View  Result Date: 05/13/2017 CLINICAL DATA:  Pleural effusion EXAM: PORTABLE CHEST 1 VIEW COMPARISON:  May 11, 2017 FINDINGS: There is a fairly small left pleural effusion with patchy consolidation in the left base. Lungs elsewhere clear. Heart is mildly enlarged with pulmonary vascularity within normal limits. No adenopathy. No bone lesions. IMPRESSION: Fairly small left pleural effusion with left base consolidation. Lungs elsewhere clear. Stable cardiac prominence. Electronically Signed   By: Lowella Grip III M.D.   On: 05/13/2017 09:52   Dg Chest Port 1 View  Result Date: 05/08/2017 CLINICAL DATA:  Chest pain, shortness of breath EXAM: PORTABLE CHEST 1 VIEW COMPARISON:  05/04/2017 FINDINGS: Consolidation in the left lower lobe with layering left effusion. No visible right effusion or confluent opacity on the right. Mild cardiomegaly. IMPRESSION: Layering left effusion with left lower lobe atelectasis or infiltrate. Electronically Signed   By: Rolm Baptise M.D.   On: 05/08/2017 18:08   Dg Abd Portable 1v  Result Date: 05/08/2017 CLINICAL DATA:  Abdominal pain EXAM: PORTABLE ABDOMEN - 1 VIEW COMPARISON:  CT 10/16/2015 FINDINGS: Prior cholecystectomy. Moderate stool burden. Calcified phleboliths in the lower pelvis. No evidence of bowel obstruction or free air. IMPRESSION: Moderate stool burden.  No evidence of obstruction or free air. Electronically Signed   By: Rolm Baptise M.D.   On: 05/08/2017 18:08   Mr Cardiac Morphology W Wo Contrast  Result Date: 05/05/2017 CLINICAL DATA:  77 year old female with pericardial effusion of unknown etiology. EXAM: CARDIAC MRI TECHNIQUE: The patient was  scanned on a 1.5 Tesla GE magnet. A dedicated cardiac coil was used. Functional imaging was done using Fiesta sequences. 2,3, and 4 chamber views were done to assess for RWMA's. Modified Simpson's rule using a short axis stack was used to calculate an ejection fraction on a dedicated work Conservation officer, nature. The patient received 27 cc of Multihance. After 10 minutes inversion recovery sequences were used to assess for infiltration and scar tissue. CONTRAST:  27 cc  of Multihance FINDINGS: 1. Normal left ventricular size, with mild concentric hypertrophy and normal systolic function (LVEF =16). There are no regional wall motion abnormalities. There is no late gadolinium enhancement in the left ventricular myocardium. LVEDD: 49 mm LVESD: 25 mm LVEDV:  87 ml LVESV: 29 ml SV: 58 ml CO: 4.2 L/min Myocardial mass: 110 g 2. Normal right ventricular size, thickness and systolic function (LVEF =91%). There are no regional wall motion abnormalities. 3.  Normal left and right atrial size. 4. Normal size of the aortic root, ascending aorta and pulmonary artery. 5.  No significant valvular abnormalities. 6. There is a small amount of pericardial effusion located predominantly lateral to the left ventricle with maximum thickness 9 mm. There is no chamber collapse, IVC is not dilated. Pericardium is severely thickened measuring up to 6 mm with significant diffuse late gadolinium enhancement. 7.  There is bilateral pleural effusion more prominent on the left. IMPRESSION: 1. Normal left ventricular size, with mild concentric hypertrophy and normal systolic function (LVEF =63). There are no regional wall motion abnormalities. There is no late gadolinium enhancement in the left ventricular myocardium. 2. Normal right ventricular size, thickness and systolic function (LVEF =84%). There are no regional wall motion abnormalities. 3.  Normal left and right atrial size. 4. Normal size of the aortic root, ascending aorta and  pulmonary artery. 5.  No significant valvular abnormalities. 6. There is a small amount of pericardial effusion located predominantly lateral to the left ventricle with maximum thickness 9 mm. There is no chamber collapse, IVC is not dilated. Pericardium is severely thickened measuring up to 6 mm with significant diffuse late gadolinium enhancement consistent with severe circumferential acute pericarditis. There is no evidence of myocarditis. Significant epicardial fat adjacent to the right ventricle. 7.  There is bilateral pleural effusion more prominent on the left. Electronically Signed   By: Ena Dawley   On: 05/05/2017 22:24   Ir Thoracentesis Asp Pleural Space W/img Guide  Result Date: 05/16/2017 INDICATION: Shortness of breath. Left-sided pleural effusion. Request for diagnostic and therapeutic thoracentesis. EXAM: ULTRASOUND GUIDED LEFT THORACENTESIS MEDICATIONS: None. COMPLICATIONS: None immediate. Postprocedural chest x-ray negative for pneumothorax PROCEDURE: An ultrasound guided thoracentesis was thoroughly discussed with the patient and questions answered. The benefits, risks, alternatives and complications were also discussed. The patient understands and wishes to proceed with the procedure. Written consent was obtained. Ultrasound was performed to localize and mark a small but adequate pocket of fluid in the left chest. The area was then prepped and draped in the normal sterile fashion. 1% Lidocaine was used for local anesthesia. Under ultrasound guidance a 6 Fr Safe-T-Centesis catheter was introduced. Thoracentesis was performed. The catheter was removed and a dressing applied. FINDINGS: A total of approximately 350 mL of clear yellow fluid was removed. Samples were sent to the laboratory as requested by the clinical team. IMPRESSION: Successful ultrasound guided left thoracentesis yielding 350 mL of pleural fluid. Read by: Ascencion Dike PA-C Electronically Signed   By: Markus Daft M.D.   On:  05/13/2017 16:23       Subjective: Patient seen and examined the bedside this morning.  Remains comfortable.  He denies any chest pain or shortness of breath.  Fever has resolved.  Stable for discharge to skilled nursing facility today.  Discharge Exam: Vitals:   05/16/17 2122 05/17/17 0444  BP: 115/70 (!) 146/64  Pulse: 83 94  Resp: 18 18  Temp: 97.8 F (36.6 C) 99.1 F (37.3 C)  SpO2: 95% 95%   Vitals:   05/16/17 1405 05/16/17 1607 05/16/17 2122 05/17/17 0444  BP:  120/64 115/70 (!) 146/64  Pulse: (!) 101 84 83 94  Resp: 18 18 18 18   Temp: 98.9 F (37.2 C) 98.3 F (36.8 C)  97.8 F (36.6 C) 99.1 F (37.3 C)  TempSrc: Oral Oral Oral Oral  SpO2:  96% 95% 95%  Weight:      Height:        General: Pt is alert, awake, not in acute distress Cardiovascular: RRR, S1/S2 +, no rubs, no gallops Respiratory: CTA bilaterally, no wheezing, no rhonchi Abdominal: Soft, NT, ND, bowel sounds + Extremities:trace edema, no cyanosis    The results of significant diagnostics from this hospitalization (including imaging, microbiology, ancillary and laboratory) are listed below for reference.     Microbiology: Recent Results (from the past 240 hour(s))  Culture, blood (routine x 2)     Status: None   Collection Time: 05/12/17  5:36 PM  Result Value Ref Range Status   Specimen Description BLOOD LEFT ANTECUBITAL  Final   Special Requests   Final    BOTTLES DRAWN AEROBIC AND ANAEROBIC Blood Culture adequate volume   Culture   Final    NO GROWTH 5 DAYS Performed at [facility], [address] [place], [place] [address]    Report Status 05/17/2017 FINAL  Final  Culture, blood (routine x 2)     Status: None   Collection Time: 05/12/17  5:36 PM  Result Value Ref Range Status   Specimen Description BLOOD LEFT FOREARM  Final   Special Requests   Final    BOTTLES DRAWN AEROBIC AND ANAEROBIC Blood Culture adequate volume   Culture   Final    NO GROWTH 5 DAYS Performed at  [facility], [place] [address], [place], [place] [address]    Report Status 05/17/2017 FINAL  Final  Culture, Urine     Status: Abnormal   Collection Time: 05/12/17  7:05 PM  Result Value Ref Range Status   Specimen Description URINE, CLEAN CATCH  Final   Special Requests   Final    NONE Performed at [facility], [place] [address] [place], [place] [address]    Culture MULTIPLE SPECIES PRESENT, SUGGEST RECOLLECTION (A)  Final   Report Status 05/13/2017 FINAL  Final  C difficile quick scan w PCR reflex     Status: None   Collection Time: 05/13/17  3:03 PM  Result Value Ref Range Status   C Diff antigen NEGATIVE NEGATIVE Final   C Diff toxin NEGATIVE NEGATIVE Final   C Diff interpretation No C. difficile detected.  Final  Acid Fast Smear (AFB)     Status: None   Collection Time: 05/13/17  3:50 PM  Result Value Ref Range Status   AFB Specimen Processing Concentration  Final   Acid Fast Smear Negative  Final    Comment: (NOTE) Performed At: [facility] [place], [place] [id number] Rush Farmer MD CB:[id number]    Source (AFB) PLEURAL  Final    Comment: LEFT  Culture, blood (routine x 2)     Status: None (Preliminary result)   Collection Time: 05/14/17 11:30 AM  Result Value Ref Range Status   Specimen Description BLOOD RIGHT HAND  Final   Special Requests   Final    BOTTLES DRAWN AEROBIC AND ANAEROBIC Blood Culture results may not be optimal due to an inadequate volume of blood received in culture bottles   Culture   Final    NO GROWTH 3 DAYS Performed at [facility], [place] [address], [place], [place] [address]    Report Status PENDING  Incomplete  Culture, blood (routine x 2)  Status: None (Preliminary result)   Collection Time: 05/14/17 11:35 AM  Result Value Ref Range Status   Specimen Description BLOOD RIGHT HAND  Final   Special Requests   Final    BOTTLES DRAWN AEROBIC AND ANAEROBIC Blood Culture adequate volume   Culture    Final    NO GROWTH 3 DAYS Performed at [facility], [address] [place], [place] [address]    Report Status PENDING  Incomplete     Labs: BNP (last 3 results) Recent Labs    05/12/17 0816  BNP 756.4*   Basic Metabolic Panel: Recent Labs  Lab 05/12/17 0816 05/14/17 1114 05/15/17 0535 05/16/17 0440  NA 136 129* 130* 131*  K 4.4 4.1 4.1 4.8  CL 104 96* 98* 98*  CO2 22 22 21* 20*  GLUCOSE 166* 179* 110* 107*  BUN 13 13 17 18   CREATININE 0.78 0.89 0.85 0.93  CALCIUM 9.4 8.5* 8.8* 9.0   Liver Function Tests: No results for input(s): AST, ALT, ALKPHOS, BILITOT, PROT, ALBUMIN in the last 168 hours. No results for input(s): LIPASE, AMYLASE in the last 168 hours. No results for input(s): AMMONIA in the last 168 hours. CBC: Recent Labs  Lab 05/12/17 0816 05/15/17 0736 05/16/17 0557 05/17/17 0451  WBC 9.0 15.4* 15.4* 13.7*  NEUTROABS 6.2 12.8* 11.6* 10.4*  HGB 12.0 11.6* 10.7* 11.2*  HCT 38.5 35.7* 33.7* 36.0  MCV 80.0 77.6* 78.2 78.1  PLT 517* 473* 503* 444*   Cardiac Enzymes: Recent Labs  Lab 05/14/17 1114 05/14/17 1650 05/14/17 2231  TROPONINI <0.03 <0.03 <0.03   BNP: Invalid input(s): POCBNP CBG: Recent Labs  Lab 05/16/17 1609 05/16/17 2125 05/17/17 0440 05/17/17 0733 05/17/17 1124  GLUCAP 191* 133* 141* 171* 217*   D-Dimer No results for input(s): DDIMER in the last 72 hours. Hgb A1c No results for input(s): HGBA1C in the last 72 hours. Lipid Profile No results for input(s): CHOL, HDL, LDLCALC, TRIG, CHOLHDL, LDLDIRECT in the last 72 hours. Thyroid function studies No results for input(s): TSH, T4TOTAL, T3FREE, THYROIDAB in the last 72 hours.  Invalid input(s): FREET3 Anemia work up No results for input(s): VITAMINB12, FOLATE, FERRITIN, TIBC, IRON, RETICCTPCT in the last 72 hours. Urinalysis    Component Value Date/Time   COLORURINE YELLOW 05/12/2017 1905   APPEARANCEUR HAZY (A) 05/12/2017 1905   LABSPEC 1.016 05/12/2017 1905    PHURINE 5.0 05/12/2017 1905   GLUCOSEU NEGATIVE 05/12/2017 1905   HGBUR NEGATIVE 05/12/2017 1905   BILIRUBINUR NEGATIVE 05/12/2017 1905   KETONESUR NEGATIVE 05/12/2017 1905   PROTEINUR NEGATIVE 05/12/2017 1905   UROBILINOGEN 0.2 08/31/2014 2118   NITRITE NEGATIVE 05/12/2017 1905   LEUKOCYTESUR TRACE (A) 05/12/2017 1905   Sepsis Labs Invalid input(s): PROCALCITONIN,  WBC,  LACTICIDVEN Microbiology Recent Results (from the past 240 hour(s))  Culture, blood (routine x 2)     Status: None   Collection Time: 05/12/17  5:36 PM  Result Value Ref Range Status   Specimen Description BLOOD LEFT ANTECUBITAL  Final   Special Requests   Final    BOTTLES DRAWN AEROBIC AND ANAEROBIC Blood Culture adequate volume   Culture   Final    NO GROWTH 5 DAYS Performed at [facility], [address], [place], [place] [address]    Report Status 05/17/2017 FINAL  Final  Culture, blood (routine x 2)     Status: None   Collection Time: 05/12/17  5:36 PM  Result Value Ref Range Status   Specimen  Description BLOOD LEFT FOREARM  Final   Special Requests   Final    BOTTLES DRAWN AEROBIC AND ANAEROBIC Blood Culture adequate volume   Culture   Final    NO GROWTH 5 DAYS Performed at [facility], [address], [place], [place] [address]    Report Status 05/17/2017 FINAL  Final  Culture, Urine     Status: Abnormal   Collection Time: 05/12/17  7:05 PM  Result Value Ref Range Status   Specimen Description URINE, CLEAN CATCH  Final   Special Requests   Final    NONE Performed at [facility], [place] [address], [place], [place] [address]    Culture MULTIPLE SPECIES PRESENT, SUGGEST RECOLLECTION (A)  Final   Report Status 05/13/2017 FINAL  Final  C difficile quick scan w PCR reflex     Status: None   Collection Time: 05/13/17  3:03 PM  Result Value Ref Range Status   C Diff antigen NEGATIVE NEGATIVE Final   C Diff toxin NEGATIVE NEGATIVE Final   C Diff interpretation No C. difficile  detected.  Final  Acid Fast Smear (AFB)     Status: None   Collection Time: 05/13/17  3:50 PM  Result Value Ref Range Status   AFB Specimen Processing Concentration  Final   Acid Fast Smear Negative  Final    Comment: (NOTE) Performed At: [facility] [place], [place] [id number] Rush Farmer MD LK:[id number]    Source (AFB) PLEURAL  Final    Comment: LEFT  Culture, blood (routine x 2)     Status: None (Preliminary result)   Collection Time: 05/14/17 11:30 AM  Result Value Ref Range Status   Specimen Description BLOOD RIGHT HAND  Final   Special Requests   Final    BOTTLES DRAWN AEROBIC AND ANAEROBIC Blood Culture results may not be optimal due to an inadequate volume of blood received in culture bottles   Culture   Final    NO GROWTH 3 DAYS Performed at [facility], [place] [address], [place], [place] [address]    Report Status PENDING  Incomplete  Culture, blood (routine x 2)     Status: None (Preliminary result)   Collection Time: 05/14/17 11:35 AM  Result Value Ref Range Status   Specimen Description BLOOD RIGHT HAND  Final   Special Requests   Final    BOTTLES DRAWN AEROBIC AND ANAEROBIC Blood Culture adequate volume   Culture   Final    NO GROWTH 3 DAYS Performed at [facility], [place] [address] [place], [place] [address]    Report Status PENDING  Incomplete     Time coordinating discharge: 35 minutes  SIGNED:   Shelly Coss, MD  [facility] 05/17/2017, 1:35 PM Pager [phone]  If 7PM-7AM, please contact night-coverage [email or web address] Password [email or web address]

## 2017-05-17 NOTE — Progress Notes (Signed)
Patient will discharge to [facility] Anticipated discharge date: 05/17/17 Family notified: [name] and Maddelyn Rocca, sons Transportation by: Sharin Mons  Nurse to call report to [phone].    CSW signing off.  Abigail Butts, LCSWA  Clinical Social Worker

## 2017-05-18 ENCOUNTER — Encounter: Payer: Self-pay | Admitting: Internal Medicine

## 2017-05-18 ENCOUNTER — Ambulatory Visit: Payer: Medicare Other | Admitting: Adult Health

## 2017-05-18 ENCOUNTER — Non-Acute Institutional Stay (SKILLED_NURSING_FACILITY): Payer: Medicare Other | Admitting: Internal Medicine

## 2017-05-18 DIAGNOSIS — I10 Essential (primary) hypertension: Secondary | ICD-10-CM | POA: Diagnosis not present

## 2017-05-18 DIAGNOSIS — I301 Infective pericarditis: Secondary | ICD-10-CM | POA: Diagnosis not present

## 2017-05-18 DIAGNOSIS — J90 Pleural effusion, not elsewhere classified: Secondary | ICD-10-CM | POA: Diagnosis not present

## 2017-05-18 DIAGNOSIS — E871 Hypo-osmolality and hyponatremia: Secondary | ICD-10-CM

## 2017-05-18 DIAGNOSIS — I313 Pericardial effusion (noninflammatory): Secondary | ICD-10-CM

## 2017-05-18 DIAGNOSIS — E7800 Pure hypercholesterolemia, unspecified: Secondary | ICD-10-CM | POA: Diagnosis not present

## 2017-05-18 DIAGNOSIS — I3139 Other pericardial effusion (noninflammatory): Secondary | ICD-10-CM

## 2017-05-18 DIAGNOSIS — G4733 Obstructive sleep apnea (adult) (pediatric): Secondary | ICD-10-CM | POA: Diagnosis not present

## 2017-05-18 DIAGNOSIS — R404 Transient alteration of awareness: Secondary | ICD-10-CM

## 2017-05-18 NOTE — Progress Notes (Signed)
: Provider:   Location:  [facility] Room Number: 268 Place of Service:  SNF [phone])  Provider: Noah Delaine. Sheppard Coil, MD  PCP: Merrilee Seashore, MD Patient Care Team: Merrilee Seashore, MD as PCP - General (Internal Medicine) Croitoru, Dani Gobble, MD as PCP - Cardiology (Cardiology)  Extended Emergency Contact Information Primary Emergency Contact: Bodnar,[name] Address: [address]          [place] [address] [place] Phone: [phone] Relation: Son Secondary Emergency Contact: Warriner,Todd Address: [place] [address] Johnnette Litter of [place] Phone: [phone] Mobile Phone: [phone] Relation: Son     Allergies: Flagyl [metronidazole hcl]; Ciprofloxacin; Metformin and related; Milk-related compounds; and Other  Chief Complaint  Patient presents with  . New Admit To SNF    HPI: Patient is 77 y.o. female bipolar disease, depression, diabetes mellitus diet-controlled, GERD, hyperlipidemia, hypertension, sleep apnea, and vertigo, who presented to [facility] emergency department with a large left pleural effusion seen by CTA. Patient had been followed by her pulmonology office with complaint of worsening short of breath with inspiration.  She was evaluated in the ED for shoulder and chest pain and was discharged home.  She was seen in pulmonary our office on 04/19/2017 and a [place] stress study was ordered as an outpatient and completed which was found to be normal.  Secondary to continuing phone encounters a chest CTA was ordered to rule out PE and unfortunately revealed a large pericardial effusion.  Patient was admitted to [facility] from 4/17-30 where she was found to have pericarditis and was treated for pericarditis and left pleural effusion.  Cardiology was consulted and patient was started on Motrin and colchicine for recurrent pericarditis with  pericardial effusion but no tampon on.  Patient underwent thoracentesis for left-sided pleural effusion.  After several days patient began to look better her fever resolved and her chest pain and shortness of breath improved significantly.  Patient is admitted to skilled nursing facility for OT/PT.  While at skilled nursing facility patient will be followed for hypertension treated with Cardizem and metoprolol, hyperlipidemia treated with pravastatin and obstructive sleep apnea treated with CPAP.  Past Medical History:  Diagnosis Date  . Depression    Bipolar  . Diabetes mellitus    diet controlled  . GERD (gastroesophageal reflux disease)   . Hyperlipidemia   . Hypertension   . Ischemic colitis, enteritis, or enterocolitis ([place])   . Pericarditis 05/06/2017  . Sleep apnea   . Stroke ([place])    x's 2  . Thyroid disease   . Urinary bladder incontinence   . Vertigo     Past Surgical History:  Procedure Laterality Date  . ABDOMINAL HYSTERECTOMY     partial   . APPENDECTOMY    . CARDIAC CATHETERIZATION N/A 12/24/2014   Procedure: Right/Left Heart Cath and Coronary Angiography;  Surgeon: Sanda Klein, MD;  Location: [facility];  Service: Cardiovascular;  Laterality: N/A;  . CHOLECYSTECTOMY    . FRACTURE SURGERY    . HEMORRHOID SURGERY    . IR THORACENTESIS ASP PLEURAL SPACE W/IMG GUIDE  05/13/2017  . RETINAL DETACHMENT SURGERY    . TONSILLECTOMY      Allergies as of 05/18/2017      Reactions   Flagyl [metronidazole Hcl] Itching, Rash   Ciprofloxacin Itching, Rash   Metformin And Related Rash  Milk-related Compounds Other (See Comments)   Stomach pains   Other Other (See Comments)   Brazil nuts cause severe facial redness      Medication List        Accurate as of 05/18/17 11:08 AM. Always use your most recent med list.          aspirin 81 MG EC tablet Take 1 tablet (81 mg total) by mouth daily.   betamethasone dipropionate 0.05 % cream Commonly known as:   DIPROLENE Apply 1 application topically 2 (two) times daily as needed (rash).   carbamazepine 200 MG 12 hr capsule Commonly known as:  CARBATROL Take 200 mg by mouth 2 (two) times daily.   clobetasol 0.05 % Gel Commonly known as:  TEMOVATE Apply 1 application topically 2 (two) times daily as needed (rash).   colchicine 0.6 MG tablet Take 1 tablet (0.6 mg total) by mouth 2 (two) times daily.   diltiazem 360 MG 24 hr capsule Commonly known as:  CARDIZEM CD Take 1 capsule (360 mg total) by mouth at bedtime.   donepezil 10 MG tablet Commonly known as:  ARICEPT Take 10 mg by mouth at bedtime.   folic acid 1 MG tablet Commonly known as:  FOLVITE Take 1 mg by mouth daily.   furosemide 40 MG tablet Commonly known as:  LASIX Take 1 tablet (40 mg total) by mouth daily.   gabapentin 300 MG capsule Commonly known as:  NEURONTIN Take 300 mg by mouth 2 (two) times daily.   HUMALOG 100 UNIT/ML injection Generic drug:  insulin lispro INJECT 0.78ML (78UNITS) INTO THE SKIN VIA V-GO PUMP AS DIRECTED   ibuprofen 800 MG tablet Commonly known as:  ADVIL,MOTRIN Take 1 tablet (800 mg total) by mouth 3 (three) times daily.   INVOKANA 300 MG Tabs tablet Generic drug:  canagliflozin TAKE ONE TABLET BY MOUTH DAILY BEFORE BREAKFAST   levothyroxine 75 MCG tablet Commonly known as:  SYNTHROID, LEVOTHROID Take 75 mcg by mouth daily before breakfast.   liraglutide 18 MG/3ML Sopn Commonly known as:  VICTOZA Inject 0.3 mLs (1.8 mg total) into the skin daily.   methotrexate 2.5 MG tablet Commonly known as:  RHEUMATREX Take 7.5 mg by mouth every Monday. For rash   metoprolol tartrate 25 MG tablet Commonly known as:  LOPRESSOR Take 0.5 tablets (12.5 mg total) by mouth 2 (two) times daily.   montelukast 5 MG chewable tablet Commonly known as:  SINGULAIR Chew 1 tablet (5 mg total) by mouth at bedtime.   OLANZapine 5 MG tablet Commonly known as:  ZYPREXA Take 5 mg by mouth at bedtime.     ondansetron 8 MG tablet Commonly known as:  ZOFRAN Take 1 tablet (8 mg total) by mouth every 8 (eight) hours as needed for nausea.   pantoprazole 40 MG tablet Commonly known as:  PROTONIX Take 1 tablet (40 mg total) by mouth daily.   pravastatin 20 MG tablet Commonly known as:  PRAVACHOL Take 20 mg by mouth daily.   V-GO 40 Kit USE ONCE DAILY AS DIRECTED   Vitamin D 2000 units tablet Take 2,000 Units by mouth daily with breakfast.       No orders of the defined types were placed in this encounter.   Immunization History  Administered Date(s) Administered  . Influenza,inj,Quad PF,6+ Mos 09/19/2014  . Influenza-Unspecified 10/02/2015  . Zoster 10/25/2013    Social History   Tobacco Use  . Smoking status: Never Smoker  . Smokeless tobacco: Never Used    Substance Use Topics  . Alcohol use: No    Family history is   Family History  Problem Relation Age of Onset  . Heart disease Mother   . Stroke Father   . Parkinson's disease Father   . Cancer Sister   . Cancer Brother       Review of Systems  DATA OBTAINED: from patient GENERAL:  no fevers, fatigue, appetite changes SKIN: No itching, or rash EYES: No eye pain, redness, discharge EARS: No earache, tinnitus, change in hearing NOSE: No congestion, drainage or bleeding  MOUTH/THROAT: No mouth or tooth pain, No sore throat RESPIRATORY: No cough, wheezing, SOB CARDIAC: No chest pain, palpitations, lower extremity edema  GI: No abdominal pain, No N/V/D or constipation, No heartburn or reflux  GU: No dysuria, frequency or urgency, or incontinence  MUSCULOSKELETAL: No unrelieved bone/joint pain NEUROLOGIC: No headache, dizziness or focal weakness PSYCHIATRIC: No c/o anxiety or sadness   Vitals:   05/18/17 1059  BP: (!) 142/81  Pulse: 90  Resp: (!) 26  Temp: (!) 97.3 F (36.3 C)  SpO2: 95%    SpO2 Readings from Last 1 Encounters:  05/18/17 95%   Body mass index is 40.72 kg/m.     Physical  Exam  GENERAL APPEARANCE: Alert, conversant,  No acute distress.  SKIN: No diaphoresis rash HEAD: Normocephalic, atraumatic  EYES: Conjunctiva/lids clear. Pupils round, reactive. EOMs intact.  EARS: External exam WNL, canals clear. Hearing grossly normal.  NOSE: No deformity or discharge.  MOUTH/THROAT: Lips w/o lesions  RESPIRATORY: Breathing is even, unlabored. Lung sounds are clear   CARDIOVASCULAR: Heart RRR no murmurs,no rub or gallops. trace peripheral edema.   GASTROINTESTINAL: Abdomen is soft, non-tender, not distended w/ normal bowel sounds. GENITOURINARY: Bladder non tender, not distended  MUSCULOSKELETAL: No abnormal joints or musculature NEUROLOGIC:  Cranial nerves 2-12 grossly intact. Moves all extremities  PSYCHIATRIC: Mood and affect appropriate to situation, no behavioral issues  Patient Active Problem List   Diagnosis Date Noted  . Bilateral pleural effusion   . Hypoxemia   . Pleural effusion on left   . Cough   . Pericarditis 05/06/2017  . Pericardial effusion 05/04/2017  . OSA (obstructive sleep apnea) 02/18/2017  . Dyspnea 01/27/2015  . Atypical chest pain 12/24/2014  . SOB (shortness of breath)   . Accelerating angina (H[facility]) 12/23/2014  . Chest pain at rest 10/30/2013  . Exertional dyspnea 10/30/2013  . Hyperlipidemia 10/30/2013  . Morbid obesity ([facility]) 10/30/2013  . Acquired autoimmune hypothyroidism 09/02/2013  . Type II or unspecified type diabetes mellitus without mention of complication, uncontrolled 06/14/2013  . Essential hypertension, benign 06/14/2013  . Hyperlipemia 06/14/2013      Labs reviewed: Basic Metabolic Panel:    Component Value Date/Time   NA 131 (L) 05/16/2017 0440   NA 136 05/03/2017 0830   K 4.8 05/16/2017 0440   CL 98 (L) 05/16/2017 0440   CO2 20 (L) 05/16/2017 0440   GLUCOSE 107 (H) 05/16/2017 0440   BUN 18 05/16/2017 0440   BUN 11 05/03/2017 0830   CREATININE 0.93 05/16/2017 0440   CREATININE 0.90 12/23/2014 1059    CALCIUM 9.0 05/16/2017 0440   PROT 7.1 03/26/2017 1710   ALBUMIN 3.5 03/26/2017 1710   AST 24 03/26/2017 1710   ALT 19 03/26/2017 1710   ALKPHOS 86 03/26/2017 1710   BILITOT 0.2 (L) 03/26/2017 1710   GFRNONAA 58 (L) 05/16/2017 0440   GFRAA >60 05/16/2017 0440    Recent Labs    05/14/17   1114 05/15/17 0535 05/16/17 0440  NA 129* 130* 131*  K 4.1 4.1 4.8  CL 96* 98* 98*  CO2 22 21* 20*  GLUCOSE 179* 110* 107*  BUN _0 CREATININE 0.89 0.85 0.93  CALCIUM 8.5* 8.8* 9.0   Liver Function Tests: Recent Labs    03/26/17 1710  AST 24  ALT 19  ALKPHOS 86  BILITOT 0.2*  PROT 7.1  ALBUMIN 3.5   No results for input(s): LIPASE, AMYLASE in the last 8760 hours. No results for input(s): AMMONIA in the last 8760 hours. CBC: Recent Labs    05/15/17 0736 05/16/17 0557 05/17/17 0451  WBC 15.4* 15.4* 13.7*  NEUTROABS 12.8* 11.6* 10.4*  HGB 11.6* 10.7* 11.2*  HCT 35.7* 33.7* 36.0  MCV 77.6* 78.2 78.1  PLT 473* 503* 444*   Lipid Recent Labs    08/17/16 1028 03/26/17 1710  CHOL 175 149  HDL 58.00 50  LDLCALC 88 73  TRIG 142.0 130    Cardiac Enzymes: Recent Labs    05/14/17 1114 05/14/17 1650 05/14/17 2231  TROPONINI <0.03 <0.03 <0.03   BNP: Recent Labs    05/12/17 0816  BNP 139.3*   Lab Results  Component Value Date   MICROALBUR <0.7 03/22/2016   Lab Results  Component Value Date   HGBA1C 7.2 (H) 02/22/2017   Lab Results  Component Value Date   TSH 2.248 05/05/2017   Lab Results  Component Value Date   SKAJGOTL57 262 07/09/2010   No results found for: FOLATE No results found for: IRON, TIBC, FERRITIN  Imaging and Procedures obtained prior to SNF admission: Dg Chest 2 View  Result Date: 05/04/2017 CLINICAL DATA:  BILATERAL pleural effusions. EXAM: CHEST - 2 VIEW COMPARISON:  CT chest 04/24/2017. FINDINGS: Increased cardiac silhouette reflects pericardial effusion. Large LEFT pleural effusion with compressive atelectasis. Small RIGHT  effusion best visualized on the lateral view. No definite consolidation. No pneumothorax. No osseous findings. IMPRESSION: Pericardial effusion and BILATERAL pleural effusions as described, reflect the findings noted on recent CT. Electronically Signed   By: Staci Righter M.D.   On: 05/04/2017 16:57   Ct Angio Chest Pe W Or Wo Contrast  Result Date: 05/04/2017 CLINICAL DATA:  Severe shortness of breath beginning 04/21/2017. EXAM: CT ANGIOGRAPHY CHEST WITH CONTRAST TECHNIQUE: Multidetector CT imaging of the chest was performed using the standard protocol during bolus administration of intravenous contrast. Multiplanar CT image reconstructions and MIPs were obtained to evaluate the vascular anatomy. CONTRAST:  3m ISOVUE-370 IOPAMIDOL (ISOVUE-370) INJECTION 76% COMPARISON:  Radiography 03/26/2017. FINDINGS: Cardiovascular: Pulmonary arterial opacification is excellent. There are no pulmonary emboli. There is aortic atherosclerosis but no aneurysm. No coronary artery calcification is seen. Heart size is normal. There is a large pericardial effusion. Mediastinum/Nodes: No mass or lymphadenopathy. Lungs/Pleura: Bilateral pleural effusions, moderate size on the left and small on the right, layering dependently with dependent pulmonary atelectasis. Upper Abdomen: Negative.  Previous cholecystectomy. Musculoskeletal: Minimal spinal degenerative changes. No significant bone finding. Review of the MIP images confirms the above findings. IMPRESSION: No pulmonary emboli. Large pericardial effusion. Moderate sized left pleural effusion and small right pleural effusion layering dependently with associated dependent pulmonary atelectasis. Aortic atherosclerosis. Electronically Signed   By: MNelson ChimesM.D.   On: 05/04/2017 15:12   Mr Cardiac Morphology W Wo Contrast  Result Date: 05/05/2017 CLINICAL DATA:  77year old female with pericardial effusion of unknown etiology. EXAM: CARDIAC MRI TECHNIQUE: The patient was  scanned on a 1.5 Tesla GE magnet. A  dedicated cardiac coil was used. Functional imaging was done using Fiesta sequences. 2,3, and 4 chamber views were done to assess for RWMA's. Modified Simpson's rule using a short axis stack was used to calculate an ejection fraction on a dedicated work station using Circle software. The patient received 27 cc of Multihance. After 10 minutes inversion recovery sequences were used to assess for infiltration and scar tissue. CONTRAST:  27 cc  of Multihance FINDINGS: 1. Normal left ventricular size, with mild concentric hypertrophy and normal systolic function (LVEF =66). There are no regional wall motion abnormalities. There is no late gadolinium enhancement in the left ventricular myocardium. LVEDD: 49 mm LVESD: 25 mm LVEDV: 87 ml LVESV: 29 ml SV: 58 ml CO: 4.2 L/min Myocardial mass: 110 g 2. Normal right ventricular size, thickness and systolic function (LVEF =68%). There are no regional wall motion abnormalities. 3.  Normal left and right atrial size. 4. Normal size of the aortic root, ascending aorta and pulmonary artery. 5.  No significant valvular abnormalities. 6. There is a small amount of pericardial effusion located predominantly lateral to the left ventricle with maximum thickness 9 mm. There is no chamber collapse, IVC is not dilated. Pericardium is severely thickened measuring up to 6 mm with significant diffuse late gadolinium enhancement. 7.  There is bilateral pleural effusion more prominent on the left. IMPRESSION: 1. Normal left ventricular size, with mild concentric hypertrophy and normal systolic function (LVEF =66). There are no regional wall motion abnormalities. There is no late gadolinium enhancement in the left ventricular myocardium. 2. Normal right ventricular size, thickness and systolic function (LVEF =68%). There are no regional wall motion abnormalities. 3.  Normal left and right atrial size. 4. Normal size of the aortic root, ascending aorta and  pulmonary artery. 5.  No significant valvular abnormalities. 6. There is a small amount of pericardial effusion located predominantly lateral to the left ventricle with maximum thickness 9 mm. There is no chamber collapse, IVC is not dilated. Pericardium is severely thickened measuring up to 6 mm with significant diffuse late gadolinium enhancement consistent with severe circumferential acute pericarditis. There is no evidence of myocarditis. Significant epicardial fat adjacent to the right ventricle. 7.  There is bilateral pleural effusion more prominent on the left. Electronically Signed   By: Katarina  Nelson   On: 05/05/2017 22:24     Not all labs, radiology exams or other studies done during hospitalization come through on my EPIC note; however they are reviewed by me.    Assessment and Plan  Acute pericarditis/pericardial effusion patient with history of pericarditis in the past; suspected to be viral; ANA negative with elevated ESR and CRP; thyroid studies normal; 2D echo on 4/22 showed no increase in pericardial effusion size no evidence of Tamponade.  Patient started on colchicine and Motrin which will be continued until she sees cardiology and follow-up as outpatient plan is to repeat echo in 2 to 3 weeks SNF -admitted for OT/PT; continue colchicine 0.6 mg twice daily and ibuprofen 800 mg 3 times a day; continue Protonix 40 mg daily while on NSAIDs  Left pleural effusion- chest x-ray showed trace left pleural effusion with patchy bibasilar opacities which is not a new finding; patient underwent thoracentesis for left-sided pleural effusion pulmonary following SNF -we will follow-up recurrence with pain/shortness of breath.  Continue Lasix 40 mg daily  Staring episodes-neurology was following; she was on carbamazepine and Keppra Keppra was discontinued. SNF -continuing carbaMAZINE 200 mg every 12 hours  Hyponatremia-improved snf -  sodium on discharge is 131; will follow up  BMP  UTI-treated with Macrobid  Hypertension SNF -stable; continue diltiazem 360 mg daily, Lasix 40 mg daily metoprolol 12.5 mg twice daily  Hyperlipidemia SNF -status uncontrolled; continue pravastatin 40 mg daily  [facility] -patient has been using CPAP with improvement; continue CPAP   Time spent greater than 45 minutes;> 50% of time with patient was spent reviewing records, labs, tests and studies, counseling and developing plan of care   D. , MD  

## 2017-05-19 ENCOUNTER — Other Ambulatory Visit: Payer: Self-pay | Admitting: Endocrinology

## 2017-05-19 ENCOUNTER — Encounter: Payer: Self-pay | Admitting: Adult Health

## 2017-05-19 DIAGNOSIS — E063 Autoimmune thyroiditis: Secondary | ICD-10-CM

## 2017-05-19 DIAGNOSIS — E1165 Type 2 diabetes mellitus with hyperglycemia: Secondary | ICD-10-CM

## 2017-05-19 DIAGNOSIS — Z794 Long term (current) use of insulin: Secondary | ICD-10-CM

## 2017-05-19 LAB — CULTURE, BLOOD (ROUTINE X 2)
Culture: NO GROWTH
Culture: NO GROWTH
SPECIAL REQUESTS: ADEQUATE

## 2017-05-20 ENCOUNTER — Ambulatory Visit (INDEPENDENT_AMBULATORY_CARE_PROVIDER_SITE_OTHER): Payer: Medicare Other | Admitting: Adult Health

## 2017-05-20 ENCOUNTER — Encounter: Payer: Self-pay | Admitting: Adult Health

## 2017-05-20 ENCOUNTER — Other Ambulatory Visit (INDEPENDENT_AMBULATORY_CARE_PROVIDER_SITE_OTHER): Payer: Medicare Other

## 2017-05-20 ENCOUNTER — Ambulatory Visit (INDEPENDENT_AMBULATORY_CARE_PROVIDER_SITE_OTHER)
Admission: RE | Admit: 2017-05-20 | Discharge: 2017-05-20 | Disposition: A | Discharge status: Home / Self Care | Payer: Medicare Other | Source: Ambulatory Visit | Attending: Adult Health | Admitting: Adult Health

## 2017-05-20 VITALS — BP 128/74 | HR 84 | Ht 63.0 in | Wt 223.0 lb

## 2017-05-20 DIAGNOSIS — E063 Autoimmune thyroiditis: Secondary | ICD-10-CM | POA: Diagnosis not present

## 2017-05-20 DIAGNOSIS — J90 Pleural effusion, not elsewhere classified: Secondary | ICD-10-CM | POA: Diagnosis not present

## 2017-05-20 DIAGNOSIS — Z794 Long term (current) use of insulin: Secondary | ICD-10-CM | POA: Diagnosis not present

## 2017-05-20 DIAGNOSIS — I3139 Other pericardial effusion (noninflammatory): Secondary | ICD-10-CM

## 2017-05-20 DIAGNOSIS — G4733 Obstructive sleep apnea (adult) (pediatric): Secondary | ICD-10-CM | POA: Diagnosis not present

## 2017-05-20 DIAGNOSIS — I313 Pericardial effusion (noninflammatory): Secondary | ICD-10-CM

## 2017-05-20 DIAGNOSIS — E1165 Type 2 diabetes mellitus with hyperglycemia: Secondary | ICD-10-CM

## 2017-05-20 LAB — COMPREHENSIVE METABOLIC PANEL
ALT: 29 U/L (ref 0–35)
AST: 24 U/L (ref 0–37)
Albumin: 3 g/dL — ABNORMAL LOW (ref 3.5–5.2)
Alkaline Phosphatase: 85 U/L (ref 39–117)
BUN: 11 mg/dL (ref 6–23)
CHLORIDE: 93 meq/L — AB (ref 96–112)
CO2: 29 meq/L (ref 19–32)
Calcium: 9.1 mg/dL (ref 8.4–10.5)
Creatinine, Ser: 0.86 mg/dL (ref 0.40–1.20)
GFR: 68.18 mL/min (ref >60.00)
GLUCOSE: 113 mg/dL — AB (ref 70–99)
POTASSIUM: 3.8 meq/L (ref 3.5–5.1)
Sodium: 132 mEq/L — ABNORMAL LOW (ref 135–145)
Total Bilirubin: 0.3 mg/dL (ref 0.2–1.2)
Total Protein: 6.6 g/dL (ref 6.0–8.3)

## 2017-05-20 LAB — HEMOGLOBIN A1C: Hgb A1c MFr Bld: 7.6 % — ABNORMAL HIGH (ref 4.6–6.5)

## 2017-05-20 NOTE — Assessment & Plan Note (Signed)
Improved compliance and control on CPAP.  Patient is encouraged on compliance.

## 2017-05-20 NOTE — Assessment & Plan Note (Signed)
Left-sided pleural effusion status post thoracentesis clinically patient is feeling better with decreased shortness of breath. X-ray today shows persistent increased interstitial markings and left greater than right pleural effusion.  Patient is clinically improving.  Will continue on current regimen and follow-up chest x-ray on return in 3 to 4 weeks.

## 2017-05-20 NOTE — Patient Instructions (Addendum)
Great job on CPAP .  Continue on CPAP At bedtime  And with naps.  Follow up with Cardiology as planned  Follow up  Dr. Vassie Loll  4-6 weeks and As needed

## 2017-05-20 NOTE — Assessment & Plan Note (Signed)
Continue on current regimen follow-up with cardiology as planned

## 2017-05-20 NOTE — Assessment & Plan Note (Signed)
Continue on anti-inflammatories.  Follow with cardiology as planned

## 2017-05-20 NOTE — Progress Notes (Signed)
$'@Patient'B$  ID: [name], female    DOB: 1940-04-06, 77 y.o.   MRN: [id number]  Chief Complaint  Patient presents with  . Follow-up    Referring provider: Merrilee Seashore, MD  HPI: 77 year old female never smoker seen for sleep consult February 2019 to establish for OSA   05/20/2017 Follow up: Pleural effusion  Presents for a hospital follow-up.  She was recently admitted for progressive shortness of breath.  She was found to have a large pericardial effusion and left pleural effusion.  She was treated with anti-inflammatories.  With colchicine.  She underwent a left thoracentesis cytology was positive for reactive cells.  Was seen by cardiology and felt that this was possibly viral in nature.  ANA was negative.  ESR and CRP were elevated.  2D echo on April 22 showed no increase in pericardial effusion size.  No evidence of Tamponade.  She was treated with diuresis. Since discharge patient says she is feeling much better.  Shortness of breath has decreased.  Her energy level and activity tolerance is also improving.  She is doing physical therapy at home. Patient has had long-standing obstructive sleep apnea but was not using her CPAP.  Patient was instructed that she needed to use this consistently.  She had been set up for a sleep study but that had not been set up yet.  Since discharge patient says she is wearing her CPAP each night and feels so much better.  She feels rested with no significant daytime sleepiness.  She says that sometimes she does get tired after physical therapy and she may lay down and she has been using her CPAP with that. Download over the last week shows excellent usage.  With average on 9 hours.  AHI 1.1.   Allergies  Allergen Reactions  . Flagyl [Metronidazole Hcl] Itching and Rash  . Ciprofloxacin Itching and Rash  . Metformin And Related Rash  . Milk-Related Compounds Other (See Comments)    Stomach pains  . Other Other (See Comments)    [place] nuts  cause severe facial redness    Immunization History  Administered Date(s) Administered  . Influenza, High Dose Seasonal PF 10/18/2016  . Influenza,inj,Quad PF,6+ Mos 09/19/2014  . Influenza-Unspecified 10/02/2015  . Pneumococcal Conjugate-13 01/19/2015  . Zoster 10/25/2013    Past Medical History:  Diagnosis Date  . Depression    Bipolar  . Diabetes mellitus    diet controlled  . GERD (gastroesophageal reflux disease)   . Hyperlipidemia   . Hypertension   . Ischemic colitis, enteritis, or enterocolitis ([place])   . Pericarditis 05/06/2017  . Sleep apnea   . Stroke ([place])    x's 2  . Thyroid disease   . Urinary bladder incontinence   . Vertigo     Tobacco History: Social History   Tobacco Use  Smoking Status Never Smoker  Smokeless Tobacco Never Used   Counseling given: Not Answered   Outpatient Encounter Medications as of 05/20/2017  Medication Sig  . aspirin EC 81 MG EC tablet Take 1 tablet (81 mg total) by mouth daily.  . betamethasone dipropionate (DIPROLENE) 0.05 % cream Apply 1 application topically 2 (two) times daily as needed (rash).   . carbamazepine (CARBATROL) 200 MG 12 hr capsule Take 200 mg by mouth 2 (two) times daily.   . Cholecalciferol (VITAMIN D) 2000 UNITS tablet Take 2,000 Units by mouth daily with breakfast.   . clobetasol (TEMOVATE) 0.05 % GEL Apply 1 application topically 2 (two) times daily  as needed (rash).   . colchicine 0.6 MG tablet Take 1 tablet (0.6 mg total) by mouth 2 (two) times daily.  Marland Kitchen diltiazem (CARDIZEM CD) 360 MG 24 hr capsule Take 1 capsule (360 mg total) by mouth at bedtime.  . donepezil (ARICEPT) 10 MG tablet Take 10 mg by mouth at bedtime.   . folic acid (FOLVITE) 1 MG tablet Take 1 mg by mouth daily.  . furosemide (LASIX) 40 MG tablet Take 1 tablet (40 mg total) by mouth daily.  Marland Kitchen gabapentin (NEURONTIN) 300 MG capsule Take 300 mg by mouth 2 (two) times daily.  Marland Kitchen HUMALOG 100 UNIT/ML injection INJECT 0.78ML (78UNITS) INTO THE  SKIN VIA V-GO PUMP AS DIRECTED  . ibuprofen (ADVIL,MOTRIN) 800 MG tablet Take 1 tablet (800 mg total) by mouth 3 (three) times daily.  . Insulin Disposable Pump (V-GO 40) KIT USE ONCE DAILY AS DIRECTED (Patient taking differently: USE ONCE DAILY AS DIRECTED WITH HUMALOG)  . INVOKANA 300 MG TABS tablet TAKE ONE TABLET BY MOUTH DAILY BEFORE BREAKFAST  . levothyroxine (SYNTHROID, LEVOTHROID) 75 MCG tablet Take 75 mcg by mouth daily before breakfast.   . liraglutide (VICTOZA) 18 MG/3ML SOPN Inject 0.3 mLs (1.8 mg total) into the skin daily.  . methotrexate (RHEUMATREX) 2.5 MG tablet Take 7.5 mg by mouth every Monday. For rash  . metoprolol tartrate (LOPRESSOR) 25 MG tablet Take 0.5 tablets (12.5 mg total) by mouth 2 (two) times daily.  . montelukast (SINGULAIR) 5 MG chewable tablet Chew 1 tablet (5 mg total) by mouth at bedtime.  Marland Kitchen OLANZapine (ZYPREXA) 5 MG tablet Take 5 mg by mouth at bedtime.  . ondansetron (ZOFRAN) 8 MG tablet Take 1 tablet (8 mg total) by mouth every 8 (eight) hours as needed for nausea.  . pantoprazole (PROTONIX) 40 MG tablet Take 1 tablet (40 mg total) by mouth daily.  . pravastatin (PRAVACHOL) 20 MG tablet Take 20 mg by mouth daily.   No facility-administered encounter medications on file as of 05/20/2017.      Review of Systems  Constitutional:   No  weight loss, night sweats,  Fevers, chills, + fatigue, or  lassitude.  HEENT:   No headaches,  Difficulty swallowing,  Tooth/dental problems, or  Sore throat,                No sneezing, itching, ear ache, nasal congestion, post nasal drip,   CV:  No chest pain,  Orthopnea, PND, +swelling in lower extremities,  No anasarca, dizziness, palpitations, syncope.   GI  No heartburn, indigestion, abdominal pain, nausea, vomiting, diarrhea, change in bowel habits, loss of appetite, bloody stools.   Resp:    No chest wall deformity  Skin: no rash or lesions.  GU: no dysuria, change in color of urine, no urgency or frequency.   No flank pain, no hematuria   MS:  No joint pain or swelling.  No decreased range of motion.  No back pain.    Physical Exam  BP 128/74 (BP Location: Left Arm, Cuff Size: Normal)   Pulse 84   Ht 5' 3"  (1.6 m)   Wt 223 lb (101.2 kg)   SpO2 94%   BMI 39.50 kg/m   GEN: A/Ox3; pleasant , NAD, obese    HEENT:  Gray/AT,  EACs-clear, TMs-wnl, NOSE-clear, THROAT-clear, no lesions, no postnasal drip or exudate noted.   NECK:  Supple w/ fair ROM; no JVD; normal carotid impulses w/o bruits; no thyromegaly or nodules palpated; no lymphadenopathy.  RESP  Clear  P & A; w/o, wheezes/ rales/ or rhonchi. no accessory muscle use, no dullness to percussion  CARD:  RRR, no m/r/g, 1+ peripheral edema, pulses intact, no cyanosis or clubbing.  GI:   Soft & nt; nml bowel sounds; no organomegaly or masses detected.   Musco: Warm bil, no deformities or joint swelling noted.   Neuro: alert, no focal deficits noted.    Skin: Warm, no lesions or rashes    Lab Results:  CBC  BMET  BNP  Imaging: Dg Chest 1 View  Result Date: 05/13/2017 CLINICAL DATA:  Post thoracentesis EXAM: CHEST  1 VIEW COMPARISON:  05/13/2017, 05/11/2017 FINDINGS: Low lung volumes. Slight decreased left pleural effusion. No pneumothorax. Mild left basilar opacity. Cardiomegaly with vascular congestion. IMPRESSION: 1. Negative for pneumothorax 2. Trace left pleural effusion with patchy basilar opacity. Cardiomegaly with mild vascular congestion. Electronically Signed   By: Donavan Foil M.D.   On: 05/13/2017 16:14   Dg Chest 2 View  Result Date: 05/20/2017 CLINICAL DATA:  Status post left thoracentesis on 05/16/2017. No current chest complaints. EXAM: CHEST - 2 VIEW COMPARISON:  05/14/2017. FINDINGS: Stable mildly enlarged cardiac silhouette. Mild increase in prominence of the pulmonary vasculature and interstitial markings. Small to moderate-sized left pleural effusion and small right pleural effusion. Patchy and linear  density at the left lung base. Minimal linear density and volume loss at the right lung base. Unremarkable bones. Cholecystectomy clips. IMPRESSION: 1. Interval mild changes of acute congestive heart failure with bilateral pleural effusions, greater on the left. 2. Left lower lobe atelectasis and possible pneumonia. 3. Minimal right basilar atelectasis. Electronically Signed   By: Claudie Revering M.D.   On: 05/20/2017 16:10   Dg Chest 2 View  Result Date: 05/11/2017 CLINICAL DATA:  Shortness of breath EXAM: CHEST - 2 VIEW COMPARISON:  05/08/2017 FINDINGS: Continued moderate layering left pleural effusion. Left lower lobe atelectasis or pneumonia is stable. Right lung clear. Heart is borderline in size. No acute bony abnormality. IMPRESSION: Layering left effusion with left lower lobe atelectasis or pneumonia, unchanged. Electronically Signed   By: Rolm Baptise M.D.   On: 05/11/2017 12:53   Dg Chest 2 View  Result Date: 05/04/2017 CLINICAL DATA:  BILATERAL pleural effusions. EXAM: CHEST - 2 VIEW COMPARISON:  CT chest 04/24/2017. FINDINGS: Increased cardiac silhouette reflects pericardial effusion. Large LEFT pleural effusion with compressive atelectasis. Small RIGHT effusion best visualized on the lateral view. No definite consolidation. No pneumothorax. No osseous findings. IMPRESSION: Pericardial effusion and BILATERAL pleural effusions as described, reflect the findings noted on recent CT. Electronically Signed   By: Staci Righter M.D.   On: 05/04/2017 16:57   Ct Angio Chest Pe W Or Wo Contrast  Result Date: 05/04/2017 CLINICAL DATA:  Severe shortness of breath beginning 04/21/2017. EXAM: CT ANGIOGRAPHY CHEST WITH CONTRAST TECHNIQUE: Multidetector CT imaging of the chest was performed using the standard protocol during bolus administration of intravenous contrast. Multiplanar CT image reconstructions and MIPs were obtained to evaluate the vascular anatomy. CONTRAST:  52m ISOVUE-370 IOPAMIDOL (ISOVUE-370)  INJECTION 76% COMPARISON:  Radiography 03/26/2017. FINDINGS: Cardiovascular: Pulmonary arterial opacification is excellent. There are no pulmonary emboli. There is aortic atherosclerosis but no aneurysm. No coronary artery calcification is seen. Heart size is normal. There is a large pericardial effusion. Mediastinum/Nodes: No mass or lymphadenopathy. Lungs/Pleura: Bilateral pleural effusions, moderate size on the left and small on the right, layering dependently with dependent pulmonary atelectasis. Upper Abdomen: Negative.  Previous cholecystectomy. Musculoskeletal: Minimal  spinal degenerative changes. No significant bone finding. Review of the MIP images confirms the above findings. IMPRESSION: No pulmonary emboli. Large pericardial effusion. Moderate sized left pleural effusion and small right pleural effusion layering dependently with associated dependent pulmonary atelectasis. Aortic atherosclerosis. Electronically Signed   By: Nelson Chimes M.D.   On: 05/04/2017 15:12   Dg Chest Port 1 View  Result Date: 05/14/2017 CLINICAL DATA:  Fever. EXAM: PORTABLE CHEST 1 VIEW COMPARISON:  May 13, 2017 FINDINGS: Mild opacity and possible tiny effusion left base is identified. The heart, hila, mediastinum, lungs, and pleura are otherwise normal. IMPRESSION: Mild opacity and possible tiny effusion in the left base is identified. Recommend attention on follow-up. No other acute abnormalities. Electronically Signed   By: Dorise Bullion III M.D   On: 05/14/2017 17:34   Dg Chest Port 1 View  Result Date: 05/13/2017 CLINICAL DATA:  Pleural effusion EXAM: PORTABLE CHEST 1 VIEW COMPARISON:  May 11, 2017 FINDINGS: There is a fairly small left pleural effusion with patchy consolidation in the left base. Lungs elsewhere clear. Heart is mildly enlarged with pulmonary vascularity within normal limits. No adenopathy. No bone lesions. IMPRESSION: Fairly small left pleural effusion with left base consolidation. Lungs  elsewhere clear. Stable cardiac prominence. Electronically Signed   By: Lowella Grip III M.D.   On: 05/13/2017 09:52   Dg Chest Port 1 View  Result Date: 05/08/2017 CLINICAL DATA:  Chest pain, shortness of breath EXAM: PORTABLE CHEST 1 VIEW COMPARISON:  05/04/2017 FINDINGS: Consolidation in the left lower lobe with layering left effusion. No visible right effusion or confluent opacity on the right. Mild cardiomegaly. IMPRESSION: Layering left effusion with left lower lobe atelectasis or infiltrate. Electronically Signed   By: Rolm Baptise M.D.   On: 05/08/2017 18:08   Dg Abd Portable 1v  Result Date: 05/08/2017 CLINICAL DATA:  Abdominal pain EXAM: PORTABLE ABDOMEN - 1 VIEW COMPARISON:  CT 10/16/2015 FINDINGS: Prior cholecystectomy. Moderate stool burden. Calcified phleboliths in the lower pelvis. No evidence of bowel obstruction or free air. IMPRESSION: Moderate stool burden.  No evidence of obstruction or free air. Electronically Signed   By: Rolm Baptise M.D.   On: 05/08/2017 18:08   Mr Cardiac Morphology W Wo Contrast  Result Date: 05/05/2017 CLINICAL DATA:  77 year old female with pericardial effusion of unknown etiology. EXAM: CARDIAC MRI TECHNIQUE: The patient was scanned on a 1.5 Tesla GE magnet. A dedicated cardiac coil was used. Functional imaging was done using Fiesta sequences. 2,3, and 4 chamber views were done to assess for RWMA's. Modified Simpson's rule using a short axis stack was used to calculate an ejection fraction on a dedicated work Conservation officer, nature. The patient received 27 cc of Multihance. After 10 minutes inversion recovery sequences were used to assess for infiltration and scar tissue. CONTRAST:  27 cc  of Multihance FINDINGS: 1. Normal left ventricular size, with mild concentric hypertrophy and normal systolic function (LVEF =16). There are no regional wall motion abnormalities. There is no late gadolinium enhancement in the left ventricular myocardium. LVEDD:  49 mm LVESD: 25 mm LVEDV: 87 ml LVESV: 29 ml SV: 58 ml CO: 4.2 L/min Myocardial mass: 110 g 2. Normal right ventricular size, thickness and systolic function (LVEF =60%). There are no regional wall motion abnormalities. 3.  Normal left and right atrial size. 4. Normal size of the aortic root, ascending aorta and pulmonary artery. 5.  No significant valvular abnormalities. 6. There is a small amount of pericardial effusion located  predominantly lateral to the left ventricle with maximum thickness 9 mm. There is no chamber collapse, IVC is not dilated. Pericardium is severely thickened measuring up to 6 mm with significant diffuse late gadolinium enhancement. 7.  There is bilateral pleural effusion more prominent on the left. IMPRESSION: 1. Normal left ventricular size, with mild concentric hypertrophy and normal systolic function (LVEF =07). There are no regional wall motion abnormalities. There is no late gadolinium enhancement in the left ventricular myocardium. 2. Normal right ventricular size, thickness and systolic function (LVEF =46%). There are no regional wall motion abnormalities. 3.  Normal left and right atrial size. 4. Normal size of the aortic root, ascending aorta and pulmonary artery. 5.  No significant valvular abnormalities. 6. There is a small amount of pericardial effusion located predominantly lateral to the left ventricle with maximum thickness 9 mm. There is no chamber collapse, IVC is not dilated. Pericardium is severely thickened measuring up to 6 mm with significant diffuse late gadolinium enhancement consistent with severe circumferential acute pericarditis. There is no evidence of myocarditis. Significant epicardial fat adjacent to the right ventricle. 7.  There is bilateral pleural effusion more prominent on the left. Electronically Signed   By: Ena Dawley   On: 05/05/2017 22:24   Ir Thoracentesis Asp Pleural Space W/img Guide  Result Date: 05/16/2017 INDICATION: Shortness of  breath. Left-sided pleural effusion. Request for diagnostic and therapeutic thoracentesis. EXAM: ULTRASOUND GUIDED LEFT THORACENTESIS MEDICATIONS: None. COMPLICATIONS: None immediate. Postprocedural chest x-ray negative for pneumothorax PROCEDURE: An ultrasound guided thoracentesis was thoroughly discussed with the patient and questions answered. The benefits, risks, alternatives and complications were also discussed. The patient understands and wishes to proceed with the procedure. Written consent was obtained. Ultrasound was performed to localize and mark a small but adequate pocket of fluid in the left chest. The area was then prepped and draped in the normal sterile fashion. 1% Lidocaine was used for local anesthesia. Under ultrasound guidance a 6 Fr Safe-T-Centesis catheter was introduced. Thoracentesis was performed. The catheter was removed and a dressing applied. FINDINGS: A total of approximately 350 mL of clear yellow fluid was removed. Samples were sent to the laboratory as requested by the clinical team. IMPRESSION: Successful ultrasound guided left thoracentesis yielding 350 mL of pleural fluid. Read by: Ascencion Dike PA-C Electronically Signed   By: Markus Daft M.D.   On: 05/13/2017 16:23     Assessment & Plan:   Pericardial effusion Continue on current regimen follow-up with cardiology as planned  Pericarditis Continue on anti-inflammatories.  Follow with cardiology as planned  OSA (obstructive sleep apnea) Improved compliance and control on CPAP.  Patient is encouraged on compliance.  Pleural effusion on left Left-sided pleural effusion status post thoracentesis clinically patient is feeling better with decreased shortness of breath. X-ray today shows persistent increased interstitial markings and left greater than right pleural effusion.  Patient is clinically improving.  Will continue on current regimen and follow-up chest x-ray on return in 3 to 4 weeks.     Rexene Edison,  NP 05/20/2017

## 2017-05-21 ENCOUNTER — Encounter: Payer: Self-pay | Admitting: Internal Medicine

## 2017-05-21 DIAGNOSIS — E871 Hypo-osmolality and hyponatremia: Secondary | ICD-10-CM | POA: Insufficient documentation

## 2017-05-21 DIAGNOSIS — R404 Transient alteration of awareness: Secondary | ICD-10-CM | POA: Insufficient documentation

## 2017-05-23 LAB — TSH: TSH: 1.78 u[IU]/mL (ref 0.35–4.50)

## 2017-05-24 ENCOUNTER — Non-Acute Institutional Stay (SKILLED_NURSING_FACILITY): Payer: Medicare Other | Admitting: Internal Medicine

## 2017-05-24 ENCOUNTER — Encounter: Payer: Self-pay | Admitting: Internal Medicine

## 2017-05-24 ENCOUNTER — Telehealth: Payer: Self-pay | Admitting: Pulmonary Disease

## 2017-05-24 DIAGNOSIS — J90 Pleural effusion, not elsewhere classified: Secondary | ICD-10-CM | POA: Diagnosis not present

## 2017-05-24 DIAGNOSIS — I10 Essential (primary) hypertension: Secondary | ICD-10-CM

## 2017-05-24 DIAGNOSIS — E871 Hypo-osmolality and hyponatremia: Secondary | ICD-10-CM

## 2017-05-24 DIAGNOSIS — R404 Transient alteration of awareness: Secondary | ICD-10-CM | POA: Diagnosis not present

## 2017-05-24 DIAGNOSIS — I313 Pericardial effusion (noninflammatory): Secondary | ICD-10-CM | POA: Diagnosis not present

## 2017-05-24 DIAGNOSIS — G4733 Obstructive sleep apnea (adult) (pediatric): Secondary | ICD-10-CM

## 2017-05-24 DIAGNOSIS — E119 Type 2 diabetes mellitus without complications: Secondary | ICD-10-CM | POA: Diagnosis not present

## 2017-05-24 DIAGNOSIS — I301 Infective pericarditis: Secondary | ICD-10-CM

## 2017-05-24 DIAGNOSIS — E063 Autoimmune thyroiditis: Secondary | ICD-10-CM

## 2017-05-24 DIAGNOSIS — E7800 Pure hypercholesterolemia, unspecified: Secondary | ICD-10-CM | POA: Diagnosis not present

## 2017-05-24 DIAGNOSIS — I3139 Other pericardial effusion (noninflammatory): Secondary | ICD-10-CM

## 2017-05-24 NOTE — Telephone Encounter (Signed)
Attempted to call pt's son, Joni Fears. Received a fast busy signal x2. Will try back.

## 2017-05-24 NOTE — Progress Notes (Signed)
Location:  [place] Room Number: [place]:  SNF (31) [name]. Sheppard Coil, MD  PCP: Merrilee Seashore, MD Patient Care Team: Merrilee Seashore, MD as PCP - General (Internal Medicine) Croitoru, Dani Gobble, MD as PCP - Cardiology (Cardiology)  Extended Emergency Contact Information Primary Emergency Contact: Lipsitz,[name] Address: [address]          [place] [address] [place] Phone: [phone] Relation: Son Secondary Emergency Contact: Geurts,Todd Address: [place] [address] Johnnette Litter of [place] Phone: [phone] Mobile Phone: [phone] Relation: Son  Allergies  Allergen Reactions  . Flagyl [Metronidazole Hcl] Itching and Rash  . Ciprofloxacin Itching and Rash  . Metformin And Related Rash  . Milk-Related Compounds Other (See Comments)    Stomach pains  . Other Other (See Comments)    B[place] nuts cause severe facial redness    Chief Complaint  Patient presents with  . Discharge Note    Discharged from SNF    HPI:  77 y.o. female with bipolar disease, depression, diabetes, diet-controlled, GERD, hyperlipidemia, hypertension, sleep apnea and vertigo who presented to [facility] emergency department with a large left pleural effusion seen by CTA.  Patient had been sent to by her pulmonology office.  Patient was admitted to [facility] from 4/17-30 where she was found to have pericarditis and was treated for pericarditis and left pleural effusion.  Cardiology was consulted and patient was started on Motrin and colchicine for recurrent pericarditis with pericardial effusion but no tampon on patient underwent thoracentesis for left-sided pleural effusion.  After several days patient began to look better, her fever resolved, her chest pain shortness of breath improved significantly.  Patient was admitted to skilled nursing facility for OT/PT  and is now ready to be discharged home.    Past Medical History:  Diagnosis Date  . Depression    Bipolar  . Diabetes mellitus    diet controlled  . GERD (gastroesophageal reflux disease)   . Hyperlipidemia   . Hypertension   . Ischemic colitis, enteritis, or enterocolitis ([place])   . Pericarditis 05/06/2017  . Sleep apnea   . Stroke ([place])    x's 2  . Thyroid disease   . Urinary bladder incontinence   . Vertigo     Past Surgical History:  Procedure Laterality Date  . ABDOMINAL HYSTERECTOMY     partial   . APPENDECTOMY    . CARDIAC CATHETERIZATION N/A 12/24/2014   Procedure: Right/Left Heart Cath and Coronary Angiography;  Surgeon: Sanda Klein, MD;  Location: [place] CV LAB;  Service: Cardiovascular;  Laterality: N/A;  . CHOLECYSTECTOMY    . FRACTURE SURGERY    . HEMORRHOID SURGERY    . IR THORACENTESIS ASP PLEURAL SPACE W/IMG GUIDE  05/13/2017  . RETINAL DETACHMENT SURGERY    . TONSILLECTOMY       reports that she has never smoked. She has never used smokeless tobacco. She reports that she does not drink alcohol or use drugs. Social History   Socioeconomic History  . Marital status: Widowed    Spouse name: Not on file  . Number of children: 2  . Years of education: 19  . Highest education level: Not on file  Occupational History  . Occupation: Retired  Scientific laboratory technician  . Financial resource strain: Not on file  . Food insecurity:    Worry:  Not on file    Inability: Not on file  . Transportation needs:    Medical: Not on file    Non-medical: Not on file  Tobacco Use  . Smoking status: Never Smoker  . Smokeless tobacco: Never Used  Substance and Sexual Activity  . Alcohol use: No  . Drug use: No  . Sexual activity: Not on file  Lifestyle  . Physical activity:    Days per week: Not on file    Minutes per session: Not on file  . Stress: Not on file  Relationships  . Social connections:    Talks on phone: Not on file    Gets together: Not on file     Attends religious service: Not on file    Active member of club or organization: Not on file    Attends meetings of clubs or organizations: Not on file    Relationship status: Not on file  . Intimate partner violence:    Fear of current or ex partner: Not on file    Emotionally abused: Not on file    Physically abused: Not on file    Forced sexual activity: Not on file  Other Topics Concern  . Not on file  Social History Narrative   Lives at home alone.   Right-handed.   No caffeine use.    Pertinent  Health Maintenance Due  Topic Date Due  . DEXA SCAN  05/03/2006  . FOOT EXAM  06/15/2014  . PNA vac Low Risk Adult (2 of 2 - PPSV23) 01/19/2016  . INFLUENZA VACCINE  08/18/2017  . HEMOGLOBIN A1C  11/20/2017  . OPHTHALMOLOGY EXAM  12/22/2017  . URINE MICROALBUMIN  05/27/2018    Medications: Allergies as of 05/24/2017      Reactions   Flagyl [metronidazole Hcl] Itching, Rash   Ciprofloxacin Itching, Rash   Metformin And Related Rash   Milk-related Compounds Other (See Comments)   Stomach pains   Other Other (See Comments)   Bol[place] nuts cause severe facial redness      Medication List        Accurate as of 05/24/17 11:59 PM. Always use your most recent med list.          aspirin 81 MG EC tablet Take 1 tablet (81 mg total) by mouth daily.   betamethasone dipropionate 0.05 % cream Commonly known as:  DIPROLENE Apply 1 application topically 2 (two) times daily as needed (rash).   carbamazepine 200 MG 12 hr capsule Commonly known as:  CARBATROL Take 200 mg by mouth 2 (two) times daily.   clobetasol 0.05 % Gel Commonly known as:  TEMOVATE Apply 1 application topically 2 (two) times daily as needed (rash).   colchicine 0.6 MG tablet Take 1 tablet (0.6 mg total) by mouth 2 (two) times daily.   diltiazem 360 MG 24 hr capsule Commonly known as:  CARDIZEM CD Take 1 capsule (360 mg total) by mouth at bedtime.   donepezil 10 MG tablet Commonly known as:  ARICEPT Take  10 mg by mouth at bedtime.   folic acid 1 MG tablet Commonly known as:  FOLVITE Take 1 mg by mouth daily.   furosemide 40 MG tablet Commonly known as:  LASIX Take 1 tablet (40 mg total) by mouth daily.   gabapentin 300 MG capsule Commonly known as:  NEURONTIN Take 300 mg by mouth 2 (two) times daily.   HUMALOG 100 UNIT/ML injection Generic drug:  insulin lispro INJECT 0.78ML (78UNITS) INTO THE SKIN  VIA V-GO PUMP AS DIRECTED   ibuprofen 800 MG tablet Commonly known as:  ADVIL,MOTRIN Take 1 tablet (800 mg total) by mouth 3 (three) times daily.   INVOKANA 300 MG Tabs tablet Generic drug:  canagliflozin TAKE ONE TABLET BY MOUTH DAILY BEFORE BREAKFAST   levothyroxine 75 MCG tablet Commonly known as:  SYNTHROID, LEVOTHROID Take 75 mcg by mouth daily before breakfast.   liraglutide 18 MG/3ML Sopn Commonly known as:  VICTOZA Inject 0.3 mLs (1.8 mg total) into the skin daily.   methotrexate 2.5 MG tablet Commonly known as:  RHEUMATREX Take 7.5 mg by mouth every Monday. For rash   metoprolol tartrate 25 MG tablet Commonly known as:  LOPRESSOR Take 0.5 tablets (12.5 mg total) by mouth 2 (two) times daily.   montelukast 5 MG chewable tablet Commonly known as:  SINGULAIR Chew 1 tablet (5 mg total) by mouth at bedtime.   OLANZapine 5 MG tablet Commonly known as:  ZYPREXA Take 5 mg by mouth at bedtime.   ondansetron 8 MG tablet Commonly known as:  ZOFRAN Take 1 tablet (8 mg total) by mouth every 8 (eight) hours as needed for nausea.   pantoprazole 40 MG tablet Commonly known as:  PROTONIX Take 1 tablet (40 mg total) by mouth daily.   pravastatin 20 MG tablet Commonly known as:  PRAVACHOL Take 20 mg by mouth daily.   V-GO 40 Kit USE ONCE DAILY AS DIRECTED        Vitals:   05/24/17 1156  BP: (!) 151/83  Pulse: 83  Resp: 20  Temp: (!) 97.2 F (36.2 C)  TempSrc: Oral  SpO2: 97%  Weight: 225 lb 9.6 oz (102.3 kg)  Height: 5' 3"  (1.6 m)   Body mass index is  39.96 kg/m.  Physical Exam  GENERAL APPEARANCE: Alert, conversant. No acute distress.  HEENT: Unremarkable. RESPIRATORY: Breathing is even, unlabored. Lung sounds are clear   CARDIOVASCULAR: Heart RRR no murmurs, rubs or gallops. No peripheral edema.  GASTROINTESTINAL: Abdomen is soft, non-tender, not distended w/ normal bowel sounds.  NEUROLOGIC: Cranial nerves 2-12 grossly intact. Moves all extremities   Labs reviewed: Basic Metabolic Panel: Recent Labs    05/15/17 0535 05/16/17 0440 05/20/17 1050  NA 130* 131* 132*  K 4.1 4.8 3.8  CL 98* 98* 93*  CO2 21* 20* 29  GLUCOSE 110* 107* 113*  BUN 17 18 11   CREATININE 0.85 0.93 0.86  CALCIUM 8.8* 9.0 9.1   Lab Results  Component Value Date   MICROALBUR <0.7 05/26/2017   Liver Function Tests: Recent Labs    03/26/17 1710 05/20/17 1050  AST 24 24  ALT 19 29  ALKPHOS 86 85  BILITOT 0.2* 0.3  PROT 7.1 6.6  ALBUMIN 3.5 3.0*   No results for input(s): LIPASE, AMYLASE in the last 8760 hours. No results for input(s): AMMONIA in the last 8760 hours. CBC: Recent Labs    05/15/17 0736 05/16/17 0557 05/17/17 0451  WBC 15.4* 15.4* 13.7*  NEUTROABS 12.8* 11.6* 10.4*  HGB 11.6* 10.7* 11.2*  HCT 35.7* 33.7* 36.0  MCV 77.6* 78.2 78.1  PLT 473* 503* 444*   Lipid Recent Labs    08/17/16 1028 03/26/17 1710  CHOL 175 149  HDL 58.00 50  LDLCALC 88 73  TRIG 142.0 130   Cardiac Enzymes: Recent Labs    05/14/17 1114 05/14/17 1650 05/14/17 2231  TROPONINI <0.03 <0.03 <0.03   BNP: Recent Labs    05/12/17 0816  BNP 139.3*   CBG:  Recent Labs    05/17/17 0440 05/17/17 0733 05/17/17 1124  GLUCAP 141* 171* 217*    Procedures and Imaging Studies During Stay: Dg Chest 1 View  Result Date: 05/13/2017 CLINICAL DATA:  Post thoracentesis EXAM: CHEST  1 VIEW COMPARISON:  05/13/2017, 05/11/2017 FINDINGS: Low lung volumes. Slight decreased left pleural effusion. No pneumothorax. Mild left basilar opacity.  Cardiomegaly with vascular congestion. IMPRESSION: 1. Negative for pneumothorax 2. Trace left pleural effusion with patchy basilar opacity. Cardiomegaly with mild vascular congestion. Electronically Signed   By: Donavan Foil M.D.   On: 05/13/2017 16:14   Dg Chest 2 View  Result Date: 05/20/2017 CLINICAL DATA:  Status post left thoracentesis on 05/16/2017. No current chest complaints. EXAM: CHEST - 2 VIEW COMPARISON:  05/14/2017. FINDINGS: Stable mildly enlarged cardiac silhouette. Mild increase in prominence of the pulmonary vasculature and interstitial markings. Small to moderate-sized left pleural effusion and small right pleural effusion. Patchy and linear density at the left lung base. Minimal linear density and volume loss at the right lung base. Unremarkable bones. Cholecystectomy clips. IMPRESSION: 1. Interval mild changes of acute congestive heart failure with bilateral pleural effusions, greater on the left. 2. Left lower lobe atelectasis and possible pneumonia. 3. Minimal right basilar atelectasis. Electronically Signed   By: Claudie Revering M.D.   On: 05/20/2017 16:10   Dg Chest 2 View  Result Date: 05/11/2017 CLINICAL DATA:  Shortness of breath EXAM: CHEST - 2 VIEW COMPARISON:  05/08/2017 FINDINGS: Continued moderate layering left pleural effusion. Left lower lobe atelectasis or pneumonia is stable. Right lung clear. Heart is borderline in size. No acute bony abnormality. IMPRESSION: Layering left effusion with left lower lobe atelectasis or pneumonia, unchanged. Electronically Signed   By: Rolm Baptise M.D.   On: 05/11/2017 12:53   Dg Chest 2 View  Result Date: 05/04/2017 CLINICAL DATA:  BILATERAL pleural effusions. EXAM: CHEST - 2 VIEW COMPARISON:  CT chest 04/24/2017. FINDINGS: Increased cardiac silhouette reflects pericardial effusion. Large LEFT pleural effusion with compressive atelectasis. Small RIGHT effusion best visualized on the lateral view. No definite consolidation. No  pneumothorax. No osseous findings. IMPRESSION: Pericardial effusion and BILATERAL pleural effusions as described, reflect the findings noted on recent CT. Electronically Signed   By: Staci Righter M.D.   On: 05/04/2017 16:57   Ct Angio Chest Pe W Or Wo Contrast  Result Date: 05/04/2017 CLINICAL DATA:  Severe shortness of breath beginning 04/21/2017. EXAM: CT ANGIOGRAPHY CHEST WITH CONTRAST TECHNIQUE: Multidetector CT imaging of the chest was performed using the standard protocol during bolus administration of intravenous contrast. Multiplanar CT image reconstructions and MIPs were obtained to evaluate the vascular anatomy. CONTRAST:  62m ISOVUE-370 IOPAMIDOL (ISOVUE-370) INJECTION 76% COMPARISON:  Radiography 03/26/2017. FINDINGS: Cardiovascular: Pulmonary arterial opacification is excellent. There are no pulmonary emboli. There is aortic atherosclerosis but no aneurysm. No coronary artery calcification is seen. Heart size is normal. There is a large pericardial effusion. Mediastinum/Nodes: No mass or lymphadenopathy. Lungs/Pleura: Bilateral pleural effusions, moderate size on the left and small on the right, layering dependently with dependent pulmonary atelectasis. Upper Abdomen: Negative.  Previous cholecystectomy. Musculoskeletal: Minimal spinal degenerative changes. No significant bone finding. Review of the MIP images confirms the above findings. IMPRESSION: No pulmonary emboli. Large pericardial effusion. Moderate sized left pleural effusion and small right pleural effusion layering dependently with associated dependent pulmonary atelectasis. Aortic atherosclerosis. Electronically Signed   By: MNelson ChimesM.D.   On: 05/04/2017 15:12   Dg Chest Port 1 View  Result Date:  05/14/2017 CLINICAL DATA:  Fever. EXAM: PORTABLE CHEST 1 VIEW COMPARISON:  May 13, 2017 FINDINGS: Mild opacity and possible tiny effusion left base is identified. The heart, hila, mediastinum, lungs, and pleura are otherwise normal.  IMPRESSION: Mild opacity and possible tiny effusion in the left base is identified. Recommend attention on follow-up. No other acute abnormalities. Electronically Signed   By: Dorise Bullion III M.D   On: 05/14/2017 17:34   Dg Chest Port 1 View  Result Date: 05/13/2017 CLINICAL DATA:  Pleural effusion EXAM: PORTABLE CHEST 1 VIEW COMPARISON:  May 11, 2017 FINDINGS: There is a fairly small left pleural effusion with patchy consolidation in the left base. Lungs elsewhere clear. Heart is mildly enlarged with pulmonary vascularity within normal limits. No adenopathy. No bone lesions. IMPRESSION: Fairly small left pleural effusion with left base consolidation. Lungs elsewhere clear. Stable cardiac prominence. Electronically Signed   By: Lowella Grip III M.D.   On: 05/13/2017 09:52   Dg Chest Port 1 View  Result Date: 05/08/2017 CLINICAL DATA:  Chest pain, shortness of breath EXAM: PORTABLE CHEST 1 VIEW COMPARISON:  05/04/2017 FINDINGS: Consolidation in the left lower lobe with layering left effusion. No visible right effusion or confluent opacity on the right. Mild cardiomegaly. IMPRESSION: Layering left effusion with left lower lobe atelectasis or infiltrate. Electronically Signed   By: Rolm Baptise M.D.   On: 05/08/2017 18:08   Dg Abd Portable 1v  Result Date: 05/08/2017 CLINICAL DATA:  Abdominal pain EXAM: PORTABLE ABDOMEN - 1 VIEW COMPARISON:  CT 10/16/2015 FINDINGS: Prior cholecystectomy. Moderate stool burden. Calcified phleboliths in the lower pelvis. No evidence of bowel obstruction or free air. IMPRESSION: Moderate stool burden.  No evidence of obstruction or free air. Electronically Signed   By: Rolm Baptise M.D.   On: 05/08/2017 18:08   Mr Cardiac Morphology W Wo Contrast  Result Date: 05/05/2017 CLINICAL DATA:  77 year old female with pericardial effusion of unknown etiology. EXAM: CARDIAC MRI TECHNIQUE: The patient was scanned on a 1.5 Tesla GE magnet. A dedicated cardiac coil was  used. Functional imaging was done using Fiesta sequences. 2,3, and 4 chamber views were done to assess for RWMA's. Modified Simpson's rule using a short axis stack was used to calculate an ejection fraction on a dedicated work Conservation officer, nature. The patient received 27 cc of Multihance. After 10 minutes inversion recovery sequences were used to assess for infiltration and scar tissue. CONTRAST:  27 cc  of Multihance FINDINGS: 1. Normal left ventricular size, with mild concentric hypertrophy and normal systolic function (LVEF =02). There are no regional wall motion abnormalities. There is no late gadolinium enhancement in the left ventricular myocardium. LVEDD: 49 mm LVESD: 25 mm LVEDV: 87 ml LVESV: 29 ml SV: 58 ml CO: 4.2 L/min Myocardial mass: 110 g 2. Normal right ventricular size, thickness and systolic function (LVEF =58%). There are no regional wall motion abnormalities. 3.  Normal left and right atrial size. 4. Normal size of the aortic root, ascending aorta and pulmonary artery. 5.  No significant valvular abnormalities. 6. There is a small amount of pericardial effusion located predominantly lateral to the left ventricle with maximum thickness 9 mm. There is no chamber collapse, IVC is not dilated. Pericardium is severely thickened measuring up to 6 mm with significant diffuse late gadolinium enhancement. 7.  There is bilateral pleural effusion more prominent on the left. IMPRESSION: 1. Normal left ventricular size, with mild concentric hypertrophy and normal systolic function (LVEF =52). There are no  regional wall motion abnormalities. There is no late gadolinium enhancement in the left ventricular myocardium. 2. Normal right ventricular size, thickness and systolic function (LVEF =29%). There are no regional wall motion abnormalities. 3.  Normal left and right atrial size. 4. Normal size of the aortic root, ascending aorta and pulmonary artery. 5.  No significant valvular abnormalities. 6.  There is a small amount of pericardial effusion located predominantly lateral to the left ventricle with maximum thickness 9 mm. There is no chamber collapse, IVC is not dilated. Pericardium is severely thickened measuring up to 6 mm with significant diffuse late gadolinium enhancement consistent with severe circumferential acute pericarditis. There is no evidence of myocarditis. Significant epicardial fat adjacent to the right ventricle. 7.  There is bilateral pleural effusion more prominent on the left. Electronically Signed   By: Ena Dawley   On: 05/05/2017 22:24   Ir Thoracentesis Asp Pleural Space W/img Guide  Result Date: 05/16/2017 INDICATION: Shortness of breath. Left-sided pleural effusion. Request for diagnostic and therapeutic thoracentesis. EXAM: ULTRASOUND GUIDED LEFT THORACENTESIS MEDICATIONS: None. COMPLICATIONS: None immediate. Postprocedural chest x-ray negative for pneumothorax PROCEDURE: An ultrasound guided thoracentesis was thoroughly discussed with the patient and questions answered. The benefits, risks, alternatives and complications were also discussed. The patient understands and wishes to proceed with the procedure. Written consent was obtained. Ultrasound was performed to localize and mark a small but adequate pocket of fluid in the left chest. The area was then prepped and draped in the normal sterile fashion. 1% Lidocaine was used for local anesthesia. Under ultrasound guidance a 6 Fr Safe-T-Centesis catheter was introduced. Thoracentesis was performed. The catheter was removed and a dressing applied. FINDINGS: A total of approximately 350 mL of clear yellow fluid was removed. Samples were sent to the laboratory as requested by the clinical team. IMPRESSION: Successful ultrasound guided left thoracentesis yielding 350 mL of pleural fluid. Read by: Ascencion Dike PA-C Electronically Signed   By: Markus Daft M.D.   On: 05/13/2017 16:23    Assessment/Plan:   Acute viral  pericarditis  Pericardial effusion  Pleural effusion on left  Staring episodes  Hyponatremia  Essential hypertension, benign  OSA (obstructive sleep apnea)  Pure hypercholesterolemia  Type 2 diabetes mellitus without complication, without long-term current use of insulin (HCC)  Acquired autoimmune hypothyroidism   Patient is being discharged with the following home health services: OT/PT/nursing  Patient is being discharged with the following durable medical equipment: None  Patient has been advised to f/u with their PCP in 1-2 weeks to bring them up to date on their rehab stay.  Social services at facility was responsible for arranging this appointment.  Pt was provided with a 30 day supply of prescriptions for medications and refills must be obtained from their PCP.  For controlled substances, a more limited supply may be provided adequate until PCP appointment only.  Medications have been reconciled.  Time spent greater than 30 minutes;> 50% of time with patient was spent reviewing records, labs, tests and studies, counseling and developing plan of care  [name]. Sheppard Coil, MD

## 2017-05-25 NOTE — Telephone Encounter (Signed)
Spoke with [name] and he wants her to get a sleep study for neurological assessment. She does not wake up in the mornings as if she is still in a deep sleep. She is groggy in the mornings when she wakes up. She is not functioning very well but she is getting a full nights rest. He feels her sleep rhythms are off and she is still using her CPAP every night without a problem. Dr. Vassie Loll please advise on recommendations.

## 2017-05-25 NOTE — Telephone Encounter (Signed)
Sending to TP who saw her

## 2017-05-25 NOTE — Telephone Encounter (Signed)
Called and spoke to pt's son, [name].  [name] stated he would like referral placed to [facility] for sleep study. Routing to [employer] to make aware.

## 2017-05-25 NOTE — Progress Notes (Signed)
Reviewed & agree with plan  

## 2017-05-25 NOTE — Telephone Encounter (Signed)
Where do they want to get sleep study at ?    That is fine for a referral to [facility] but they will decide on sleep test or other studies to be done. I can not refer for sleep study .

## 2017-05-26 ENCOUNTER — Encounter: Payer: Self-pay | Admitting: Endocrinology

## 2017-05-26 ENCOUNTER — Ambulatory Visit (INDEPENDENT_AMBULATORY_CARE_PROVIDER_SITE_OTHER): Payer: Medicare Other | Admitting: Endocrinology

## 2017-05-26 ENCOUNTER — Ambulatory Visit: Payer: Medicare Other | Admitting: Endocrinology

## 2017-05-26 DIAGNOSIS — Z794 Long term (current) use of insulin: Secondary | ICD-10-CM | POA: Diagnosis not present

## 2017-05-26 DIAGNOSIS — E1165 Type 2 diabetes mellitus with hyperglycemia: Secondary | ICD-10-CM | POA: Diagnosis not present

## 2017-05-26 LAB — MICROALBUMIN / CREATININE URINE RATIO
Creatinine,U: 40.7 mg/dL
MICROALB/CREAT RATIO: 1.7 mg/g (ref 0.0–30.0)
Microalb, Ur: <0.7 mg/dL (ref 0.0–1.9)

## 2017-05-26 NOTE — Telephone Encounter (Signed)
Called spoke with patient's son [name] to ensure that it was made clear that we are happy to refer patient to [facility] but that office will have to decide on testing - we cannot refer for a specific test.  [name] okay with this and voiced his understanding.  [name] wanted to make sure that pt's 4.25.19 EEG results will be included >> notation included in referral.  [name] aware to contact the office if anything further is needed.  Nothing further needed; will sign off.

## 2017-05-26 NOTE — Patient Instructions (Signed)
Check blood sugars on waking up 3-4/7   Also check blood sugars about 2 hours after a meal and do this after different meals by rotation  Recommended blood sugar levels on waking up is 90-130 and about 2 hours after meal is 130-160  Please bring your blood sugar monitor to each visit, thank you  Check BP  Invokana in am

## 2017-05-26 NOTE — Progress Notes (Signed)
Patient ID: [name], female   DOB: [birth year], 77 y.o.   MRN: [id number]    Reason for Appointment: Followup for Type 2 Diabetes    History of Present Illness:          Diagnosis: Type 2 diabetes mellitus, date of diagnosis:2011       Past history: Since she was apparently intolerant to metformin she was treated with Onglyza until about 2014  At that time because of increasing A1c of about 8% she was started on Levemir 15 units and this was aggressively increased Onglyza was continued She has not tried any other treatments for diabetes Her A1c has been 10-10.5 in 2015 In 3/15 she was told to start small doses of NovoLog at lunch and supper and add 15 units of Levemir at night also To control  hyperglycemia with her basal bolus insulin regimen she was given Victoza in addition on her initial consultation in 5/15 She has been on Victoza since 8/15  She has been on the V-go pump since 09/2014  Recent history:   INSULIN regimen :  V-go pump 40 unit basal, boluses at meal times: 10-27-12 units  Noninsulin hypoglycemic drugs the patient is taking are: Invokana 300 mg, on VICTOZA 1.8 mg  She was hospitalized and is just getting discharged from her rehab facility, is here for her 80-monthfollow-up  Her A1c is now 7.6, slightly higher   Current management, blood sugar patterns and problems:  She has only a few blood sugars on her monitor from last month and only a couple of readings this week in the morning  She has mild increase in blood sugars but not consistently and only has a couple of readings in the evenings after meals  Currently is still using her V-go pump with the same amount of boluses  However during her hospitalization and rehab stay she had not been giving her Victoza and Invokana regularly probably causing higher readings  Because of her illness she has lost about 7 pounds since her last visit  Diet has been variable because of not being at home  recently but she is having a fairly good appetite  She was able to continue her pump all along and giving boluses consistently before meals   Meals: 3 meals per day. Bfst oatmeal or egg or cereal at 9 am, dinner 6-7 pm.; no hs snacks   Side effects from medications have been: Metformin: rash  Glucose monitoring:  done 1 time a day or less, mostly in the morning       Glucometer:  One Touch.      Blood Glucose readings from home glucose meter download:   Blood sugar range 93-178 with median 146, most readings late morning or midday  Glycemic control:    Lab Results  Component Value Date   HGBA1C 7.6 (H) 05/20/2017   HGBA1C 7.2 (H) 02/22/2017   HGBA1C 7.7 (H) 11/18/2016   Lab Results  Component Value Date   MICROALBUR <0.7 03/22/2016   LDLCALC 73 03/26/2017   CREATININE 0.86 05/20/2017    Self-care: The diet that the patient has been following is: tries to limit fats   Breakfast: She will add [place] or sausage for protein, usually getting eggs also           Exercise: Walks 20 min 3-4/7  days     Dietician visit: Most recent: 2/19   CDE visit: 9/16     Compliance with the medical regimen:  Inconsistent   Weight history:  Wt Readings from Last 3 Encounters:  05/26/17 223 lb (101.2 kg)  05/24/17 225 lb 9.6 oz (102.3 kg)  05/20/17 223 lb (101.2 kg)    Other active problems: See review of systems    Allergies as of 05/26/2017      Reactions   Flagyl [metronidazole Hcl] Itching, Rash   Ciprofloxacin Itching, Rash   Metformin And Related Rash   Milk-related Compounds Other (See Comments)   Stomach pains   Other Other (See Comments)   [place] nuts cause severe facial redness      Medication List        Accurate as of 05/26/17 11:01 AM. Always use your most recent med list.          aspirin 81 MG EC tablet Take 1 tablet (81 mg total) by mouth daily.   betamethasone dipropionate 0.05 % cream Commonly known as:  DIPROLENE Apply 1 application topically  2 (two) times daily as needed (rash).   carbamazepine 200 MG 12 hr capsule Commonly known as:  CARBATROL Take 200 mg by mouth 2 (two) times daily.   clobetasol 0.05 % Gel Commonly known as:  TEMOVATE Apply 1 application topically 2 (two) times daily as needed (rash).   colchicine 0.6 MG tablet Take 1 tablet (0.6 mg total) by mouth 2 (two) times daily.   diltiazem 360 MG 24 hr capsule Commonly known as:  CARDIZEM CD Take 1 capsule (360 mg total) by mouth at bedtime.   donepezil 10 MG tablet Commonly known as:  ARICEPT Take 10 mg by mouth at bedtime.   folic acid 1 MG tablet Commonly known as:  FOLVITE Take 1 mg by mouth daily.   furosemide 40 MG tablet Commonly known as:  LASIX Take 1 tablet (40 mg total) by mouth daily.   gabapentin 300 MG capsule Commonly known as:  NEURONTIN Take 300 mg by mouth 2 (two) times daily.   HUMALOG 100 UNIT/ML injection Generic drug:  insulin lispro INJECT 0.78ML (78UNITS) INTO THE SKIN VIA V-GO PUMP AS DIRECTED   ibuprofen 800 MG tablet Commonly known as:  ADVIL,MOTRIN Take 1 tablet (800 mg total) by mouth 3 (three) times daily.   INVOKANA 300 MG Tabs tablet Generic drug:  canagliflozin TAKE ONE TABLET BY MOUTH DAILY BEFORE BREAKFAST   levothyroxine 75 MCG tablet Commonly known as:  SYNTHROID, LEVOTHROID Take 75 mcg by mouth daily before breakfast.   liraglutide 18 MG/3ML Sopn Commonly known as:  VICTOZA Inject 0.3 mLs (1.8 mg total) into the skin daily.   methotrexate 2.5 MG tablet Commonly known as:  RHEUMATREX Take 7.5 mg by mouth every Monday. For rash   metoprolol tartrate 25 MG tablet Commonly known as:  LOPRESSOR Take 0.5 tablets (12.5 mg total) by mouth 2 (two) times daily.   montelukast 5 MG chewable tablet Commonly known as:  SINGULAIR Chew 1 tablet (5 mg total) by mouth at bedtime.   OLANZapine 5 MG tablet Commonly known as:  ZYPREXA Take 5 mg by mouth at bedtime.   ondansetron 8 MG tablet Commonly known  as:  ZOFRAN Take 1 tablet (8 mg total) by mouth every 8 (eight) hours as needed for nausea.   pantoprazole 40 MG tablet Commonly known as:  PROTONIX Take 1 tablet (40 mg total) by mouth daily.   pravastatin 20 MG tablet Commonly known as:  PRAVACHOL Take 20 mg by mouth daily.   V-GO 40 Kit USE ONCE DAILY AS DIRECTED  Allergies:  Allergies  Allergen Reactions  . Flagyl [Metronidazole Hcl] Itching and Rash  . Ciprofloxacin Itching and Rash  . Metformin And Related Rash  . Milk-Related Compounds Other (See Comments)    Stomach pains  . Other Other (See Comments)    [place] nuts cause severe facial redness    Past Medical History:  Diagnosis Date  . Depression    Bipolar  . Diabetes mellitus    diet controlled  . GERD (gastroesophageal reflux disease)   . Hyperlipidemia   . Hypertension   . Ischemic colitis, enteritis, or enterocolitis ([place])   . Pericarditis 05/06/2017  . Sleep apnea   . Stroke ([place])    x's 2  . Thyroid disease   . Urinary bladder incontinence   . Vertigo     Past Surgical History:  Procedure Laterality Date  . ABDOMINAL HYSTERECTOMY     partial   . APPENDECTOMY    . CARDIAC CATHETERIZATION N/A 12/24/2014   Procedure: Right/Left Heart Cath and Coronary Angiography;  Surgeon: Sanda Klein, MD;  Location: [facility];  Service: Cardiovascular;  Laterality: N/A;  . CHOLECYSTECTOMY    . FRACTURE SURGERY    . HEMORRHOID SURGERY    . IR THORACENTESIS ASP PLEURAL SPACE W/IMG GUIDE  05/13/2017  . RETINAL DETACHMENT SURGERY    . TONSILLECTOMY      Family History  Problem Relation Age of Onset  . Heart disease Mother   . Stroke Father   . Parkinson's disease Father   . Cancer Sister   . Cancer Brother     Social History:  reports that she has never smoked. She has never used smokeless tobacco. She reports that she does not drink alcohol or use drugs.    Review of Systems   She has had less edema and takes Lasix regularly  now  NEUROPATHY: She has lower leg pain and takes gabapentin twice a day for this, however does not have any symptoms      Lipids: Last LDL from PCP was 101, followed by PCP also        Lab Results  Component Value Date   CHOL 149 03/26/2017   HDL 50 03/26/2017   LDLCALC 73 03/26/2017   TRIG 130 03/26/2017   CHOLHDL 3.0 03/26/2017       Thyroid:   Has had presumed hypothyroidism for over 20 years, has not had a change in medications in several years and last TSH was 1.9 in August from PCP   Lab Results  Component Value Date   TSH 1.78 05/20/2017   TSH 2.248 05/05/2017   TSH 1.32 12/19/2015   FREET4 1.04 05/05/2017   FREET4 0.86 01/31/2014   FREET4 0.91 08/28/2013       The blood pressure has been controlled with a combination of amlodipine and Benicar as well as hydralazine and bisoprolol      Physical Examination:  BP 140/72 (BP Location: Left Arm, Patient Position: Sitting, Cuff Size: Normal)   Pulse 80   Ht 5' 3"  (1.6 m)   Wt 223 lb (101.2 kg)   SpO2 94%   BMI 39.50 kg/m       ASSESSMENT/PLAN:   Diabetes type 2, uncontrolled   See history of present illness for detailed discussion of his current management, blood sugar patterns and problems identified  Her blood sugars are Looking better now compared to her last visit This has improved mostly with her trying to change her V-go pump consistently around the  same time every evening However not checking enough readings after meals and has only sporadic readings lately Blood sugars maybe higher with eating cereal in the morning and discussed balancing all meals with some protein and not excessive carbohydrate  Other recommendations: She needs to check more readings after supper Also since she thinks she is eating more at lunch she can take about 12 units of cover her lunch and also reduce suppertime dose down to 10-12 units based on her meal size since her readings at night are about 100 She will let us know if  she has more variability in her blood sugars but for now will continue the same regimen Also she can try and adjust her doses further with better postprandial monitoring  Follow-up in 3 months again   There are no Patient Instructions on file for this visit.   Elayne Snare 05/26/2017, 11:01 AM   Note: This office note was prepared with Dragon voice recognition system technology. Any transcriptional errors that result from this process are unintentional.                     Patient ID: [name], female   DOB: 06-01-1940, 77 y.o.   MRN: [id number]    Reason for Appointment: Followup for Type 2 Diabetes and swelling of legs  History of Present Illness:          Diagnosis: Type 2 diabetes mellitus, date of diagnosis:2011       Past history: Since she was apparently intolerant to metformin she was treated with Onglyza until about 2014  At that time because of increasing A1c of about 8% she was started on Levemir 15 units and this was aggressively increased Onglyza was continued She has not tried any other treatments for diabetes Her A1c has been 10-10.5 in 2015 In 3/15 she was told to start small doses of NovoLog at lunch and supper and add 15 units of Levemir at night also To control  hyperglycemia with her basal bolus insulin regimen she was given Victoza in addition on her initial consultation in 5/15  Recent history:   INSULIN regimen :  V-go pump 40 unit basal, boluses at meal times: 5-6-8     Oral hypoglycemic drugs the patient is taking are: None  She was changed on 09/19/14 to the V-go pump instead of basal bolus insulin shots She has been on Victoza since 8/15 and she is taking 1.2 mg  With the V-go pump her blood sugars have improved overall and are less variable  Current blood sugar patterns and problems:  Fasting blood sugars are very consistent, usually just above 100  She has stopped checking her blood sugars later in the day even though she was  told to vary the times when she checks her sugar  She has no difficulty remembering to do her boluses with meals but usually does not change the amounts  She has improved her diet and is trying to  eliminate high glycemic index foods and juices  Recently trying to walk more regularly also  No hypoglycemia  She has no difficulty with using the V-go pump, doing the boluses when she is eating out and changing it every morning      Side effects from medications have been: Metformin: rash  Glucose monitoring:  done 1 time a day         Glucometer:  One Touch.      Blood Glucose readings from home glucose meter download:  FASTING readings recently: 90-147, usually under 130.  Her monitor has incorrect date and time programmed   Glycemic control:    Lab Results  Component Value Date   HGBA1C 7.6 (H) 05/20/2017   HGBA1C 7.2 (H) 02/22/2017   HGBA1C 7.7 (H) 11/18/2016   Lab Results  Component Value Date   MICROALBUR <0.7 03/22/2016   LDLCALC 73 03/26/2017   CREATININE 0.86 05/20/2017    Self-care: The diet that the patient has been following is: tries to limit fats     Meals: 3 meals per day. Bfst oatmeal or egg/toast at 8-9 am, dinner 5 pm cutting back on frequent meals and  portions; no hs snacks           Exercise: Walks 15-20 min       Dietician visit: Most recent: 2014 ( class).               Compliance with the medical regimen: Good  Retinal exam: Most recent:.9/14    Weight history:  Wt Readings from Last 3 Encounters:  05/26/17 223 lb (101.2 kg)  05/24/17 225 lb 9.6 oz (102.3 kg)  05/20/17 223 lb (101.2 kg)    Allergies as of 05/26/2017      Reactions   Flagyl [metronidazole Hcl] Itching, Rash   Ciprofloxacin Itching, Rash   Metformin And Related Rash   Milk-related Compounds Other (See Comments)   Stomach pains   Other Other (See Comments)   [place] nuts cause severe facial redness      Medication List        Accurate as of 05/26/17 11:01 AM. Always use  your most recent med list.          aspirin 81 MG EC tablet Take 1 tablet (81 mg total) by mouth daily.   betamethasone dipropionate 0.05 % cream Commonly known as:  DIPROLENE Apply 1 application topically 2 (two) times daily as needed (rash).   carbamazepine 200 MG 12 hr capsule Commonly known as:  CARBATROL Take 200 mg by mouth 2 (two) times daily.   clobetasol 0.05 % Gel Commonly known as:  TEMOVATE Apply 1 application topically 2 (two) times daily as needed (rash).   colchicine 0.6 MG tablet Take 1 tablet (0.6 mg total) by mouth 2 (two) times daily.   diltiazem 360 MG 24 hr capsule Commonly known as:  CARDIZEM CD Take 1 capsule (360 mg total) by mouth at bedtime.   donepezil 10 MG tablet Commonly known as:  ARICEPT Take 10 mg by mouth at bedtime.   folic acid 1 MG tablet Commonly known as:  FOLVITE Take 1 mg by mouth daily.   furosemide 40 MG tablet Commonly known as:  LASIX Take 1 tablet (40 mg total) by mouth daily.   gabapentin 300 MG capsule Commonly known as:  NEURONTIN Take 300 mg by mouth 2 (two) times daily.   HUMALOG 100 UNIT/ML injection Generic drug:  insulin lispro INJECT 0.78ML (78UNITS) INTO THE SKIN VIA V-GO PUMP AS DIRECTED   ibuprofen 800 MG tablet Commonly known as:  ADVIL,MOTRIN Take 1 tablet (800 mg total) by mouth 3 (three) times daily.   INVOKANA 300 MG Tabs tablet Generic drug:  canagliflozin TAKE ONE TABLET BY MOUTH DAILY BEFORE BREAKFAST   levothyroxine 75 MCG tablet Commonly known as:  SYNTHROID, LEVOTHROID Take 75 mcg by mouth daily before breakfast.   liraglutide 18 MG/3ML Sopn Commonly known as:  VICTOZA Inject 0.3 mLs (1.8 mg total) into the skin daily.  methotrexate 2.5 MG tablet Commonly known as:  RHEUMATREX Take 7.5 mg by mouth every Monday. For rash   metoprolol tartrate 25 MG tablet Commonly known as:  LOPRESSOR Take 0.5 tablets (12.5 mg total) by mouth 2 (two) times daily.   montelukast 5 MG chewable  tablet Commonly known as:  SINGULAIR Chew 1 tablet (5 mg total) by mouth at bedtime.   OLANZapine 5 MG tablet Commonly known as:  ZYPREXA Take 5 mg by mouth at bedtime.   ondansetron 8 MG tablet Commonly known as:  ZOFRAN Take 1 tablet (8 mg total) by mouth every 8 (eight) hours as needed for nausea.   pantoprazole 40 MG tablet Commonly known as:  PROTONIX Take 1 tablet (40 mg total) by mouth daily.   pravastatin 20 MG tablet Commonly known as:  PRAVACHOL Take 20 mg by mouth daily.   V-GO 40 Kit USE ONCE DAILY AS DIRECTED       Allergies:  Allergies  Allergen Reactions  . Flagyl [Metronidazole Hcl] Itching and Rash  . Ciprofloxacin Itching and Rash  . Metformin And Related Rash  . Milk-Related Compounds Other (See Comments)    Stomach pains  . Other Other (See Comments)    [place] nuts cause severe facial redness    Past Medical History:  Diagnosis Date  . Depression    Bipolar  . Diabetes mellitus    diet controlled  . GERD (gastroesophageal reflux disease)   . Hyperlipidemia   . Hypertension   . Ischemic colitis, enteritis, or enterocolitis ([place])   . Pericarditis 05/06/2017  . Sleep apnea   . Stroke ([place])    x's 2  . Thyroid disease   . Urinary bladder incontinence   . Vertigo     Past Surgical History:  Procedure Laterality Date  . ABDOMINAL HYSTERECTOMY     partial   . APPENDECTOMY    . CARDIAC CATHETERIZATION N/A 12/24/2014   Procedure: Right/Left Heart Cath and Coronary Angiography;  Surgeon: Sanda Klein, MD;  Location: [facility];  Service: Cardiovascular;  Laterality: N/A;  . CHOLECYSTECTOMY    . FRACTURE SURGERY    . HEMORRHOID SURGERY    . IR THORACENTESIS ASP PLEURAL SPACE W/IMG GUIDE  05/13/2017  . RETINAL DETACHMENT SURGERY    . TONSILLECTOMY      Family History  Problem Relation Age of Onset  . Heart disease Mother   . Stroke Father   . Parkinson's disease Father   . Cancer Sister   . Cancer Brother     Social  History:  reports that she has never smoked. She has never used smokeless tobacco. She reports that she does not drink alcohol or use drugs.    Review of Systems      The blood pressure has been usually controlled with a combination of Bisoprolol, diltiazem  as well as Aldactone.  Also off Invokana 300 mg recently  She does have a monitor at home  Benicar previously stopped because of  Diarrhea   Lab Results  Component Value Date   CREATININE 0.86 05/20/2017   BUN 11 05/20/2017   NA 132 (L) 05/20/2017   K 3.8 05/20/2017   CL 93 (L) 05/20/2017   CO2 29 05/20/2017    NEUROPATHY: She has lower leg neuropathic pains and takes gabapentin twice a day  LIPIDS: Controlled with 20 mg pravastatin       Lab Results  Component Value Date   CHOL 149 03/26/2017   HDL 50 03/26/2017  LDLCALC 73 03/26/2017   TRIG 130 03/26/2017   CHOLHDL 3.0 03/26/2017       Thyroid:    Has had presumed hypothyroidism for over 20 years, taking 75 mcg levothyroxine consistently 9 TSH is again normal   Lab Results  Component Value Date   TSH 1.78 05/20/2017   TSH 2.248 05/05/2017   TSH 1.32 12/19/2015   FREET4 1.04 05/05/2017   FREET4 0.86 01/31/2014   FREET4 0.91 08/28/2013    Depression: She is on long-term Zyprexa 5 mg daily     Physical Examination:  BP 140/72 (BP Location: Left Arm, Patient Position: Sitting, Cuff Size: Normal)   Pulse 80   Ht 5' 3"  (1.6 m)   Wt 223 lb (101.2 kg)   SpO2 94%   BMI 39.50 kg/m    2+ edema present   ASSESSMENT/PLAN:   Diabetes type 2, current BMI 39.5 See history of present illness for detailed discussion of current  management, blood sugar patterns and problems identified.  She is on a regimen of the V-go pump and also Invokana and Victoza  Recent A1c is 7.6 Even with her hospitalization and recently being irregular with her Invokana and Victoza her blood sugars are reasonably well controlled Does not have many readings for review  because of being in rehab until today No outside records are available  She is still trying to follow the diet well since instructions from the dietitian earlier this year  Will not make any changes since she does not have any obvious abnormal blood sugar pattern Does need to check readings consistently at different times of the day by rotation She will resume Invokana Since she still has edema she will not reduce her Lasix as yet    There are no Patient Instructions on file for this visit.    Elayne Snare 05/26/2017, 11:01 AM   Note: This office note was prepared with Dragon voice recognition system technology. Any transcriptional errors that result from this process are unintentional.

## 2017-05-27 DIAGNOSIS — I509 Heart failure, unspecified: Secondary | ICD-10-CM | POA: Diagnosis not present

## 2017-05-27 DIAGNOSIS — E079 Disorder of thyroid, unspecified: Secondary | ICD-10-CM | POA: Diagnosis not present

## 2017-05-27 DIAGNOSIS — K559 Vascular disorder of intestine, unspecified: Secondary | ICD-10-CM | POA: Diagnosis not present

## 2017-05-27 DIAGNOSIS — E119 Type 2 diabetes mellitus without complications: Secondary | ICD-10-CM | POA: Diagnosis not present

## 2017-05-27 DIAGNOSIS — K219 Gastro-esophageal reflux disease without esophagitis: Secondary | ICD-10-CM | POA: Diagnosis not present

## 2017-05-27 DIAGNOSIS — I110 Hypertensive heart disease with heart failure: Secondary | ICD-10-CM | POA: Diagnosis not present

## 2017-05-27 DIAGNOSIS — G4730 Sleep apnea, unspecified: Secondary | ICD-10-CM | POA: Diagnosis not present

## 2017-05-27 DIAGNOSIS — F3130 Bipolar disorder, current episode depressed, mild or moderate severity, unspecified: Secondary | ICD-10-CM | POA: Diagnosis not present

## 2017-05-27 NOTE — Progress Notes (Signed)
Reviewed & agree with plan  

## 2017-05-30 ENCOUNTER — Other Ambulatory Visit: Payer: Self-pay | Admitting: Endocrinology

## 2017-05-31 ENCOUNTER — Encounter: Payer: Self-pay | Admitting: Physician Assistant

## 2017-05-31 ENCOUNTER — Ambulatory Visit (INDEPENDENT_AMBULATORY_CARE_PROVIDER_SITE_OTHER): Payer: Medicare Other | Admitting: Physician Assistant

## 2017-05-31 VITALS — BP 140/78 | HR 89 | Ht 63.0 in | Wt 216.6 lb

## 2017-05-31 DIAGNOSIS — I10 Essential (primary) hypertension: Secondary | ICD-10-CM | POA: Diagnosis not present

## 2017-05-31 DIAGNOSIS — I300 Acute nonspecific idiopathic pericarditis: Secondary | ICD-10-CM

## 2017-05-31 DIAGNOSIS — I313 Pericardial effusion (noninflammatory): Secondary | ICD-10-CM

## 2017-05-31 DIAGNOSIS — I3139 Other pericardial effusion (noninflammatory): Secondary | ICD-10-CM

## 2017-05-31 DIAGNOSIS — J948 Other specified pleural conditions: Secondary | ICD-10-CM | POA: Diagnosis not present

## 2017-05-31 MED ORDER — METOPROLOL TARTRATE 25 MG PO TABS: 25.0000 mg | ORAL_TABLET | Freq: Two times a day (BID) | ORAL | 3 refills | Status: DC

## 2017-05-31 NOTE — Progress Notes (Signed)
Cardiology Office Note   Date:  05/31/2017   ID:  [name], DOB March 25, 1940, MRN [id number]  PCP:  Merrilee Seashore, MD  Cardiologist: Dr. Sallyanne Kuster, 08/04/2015 Rosaria Ferries, PA-C 04/19/2017  Chief Complaint  Patient presents with  . Follow-up    pt complains of chest pains, SOB, swelling in feet    History of Present Illness: [name] is a 77 y.o. female with a history of DM, HTN, TIA, obesity, ischemic colitis, nl MV 2012, normal cath 2016  Admitted 4/17-4/30/2019 for acute pericarditis and pericardial effusion.  She had thoracentesis for left pleural effusion, deconditioning, neurology saw for episodes of staring, Keppra discontinued, on carbamazepine for possible seizures Phone notes from son who wants her to have a sleep study>> son is present today and states that that was a miscommunication he wants her to see neurology for other issues Tammy parent, NP, saw 05/20/2017 in chest x-ray improved, follow-up chest x-ray in 3-4 weeks 05/24/2017, discharge from the nursing home  [name] presents for cardiology follow up.   She is using the CPAP every night and when she naps during the day.   Her breathing is slowly improving. Today is not a good day, others are better.   Her head is in a cloud. She feels she cannot focus very well.  Her son states there have been problems from this that are worse in the morning, but she will be better during the middle part of the day.  She is compliant with her medications.  She was worried about her SOB, so ambulated pt with pulse-Ox. Her O2 sats stayed > 92%, but she did get very SOB within 100 feet.  She had a cane with her, but did not use it.  When she walks longer distances, she uses a walker.  She feels it is easier to stop and rest when using a walker which she has to do frequently.  She does not wake with LE edema. CPAP is helping her breathing at night.   She has not had chest pain.  She has seen a  gerontologist in the past to help minimize polypharmacy, they happen appointment with the gerontologist but not until October.  Her son wonders if she needs potassium supplementation since she is taking the Lasix.   Past Medical History:  Diagnosis Date  . Depression    Bipolar  . Diabetes mellitus    diet controlled  . GERD (gastroesophageal reflux disease)   . Hyperlipidemia   . Hypertension   . Ischemic colitis, enteritis, or enterocolitis ([place])   . Pericarditis 05/06/2017  . Sleep apnea   . Stroke ([place])    x's 2  . Thyroid disease   . Urinary bladder incontinence   . Vertigo     Past Surgical History:  Procedure Laterality Date  . ABDOMINAL HYSTERECTOMY     partial   . APPENDECTOMY    . CARDIAC CATHETERIZATION N/A 12/24/2014   Procedure: Right/Left Heart Cath and Coronary Angiography;  Surgeon: Sanda Klein, MD;  Location: [facility] [facility];  Service: Cardiovascular;  Laterality: N/A;  . CHOLECYSTECTOMY    . FRACTURE SURGERY    . HEMORRHOID SURGERY    . IR THORACENTESIS ASP PLEURAL SPACE W/IMG GUIDE  05/13/2017  . RETINAL DETACHMENT SURGERY    . TONSILLECTOMY      Current Outpatient Medications  Medication Sig Dispense Refill  . aspirin EC 81 MG EC tablet Take 1 tablet (81 mg total) by mouth  daily. 30 tablet 0  . betamethasone dipropionate (DIPROLENE) 0.05 % cream Apply 1 application topically 2 (two) times daily as needed (rash).     . carbamazepine (CARBATROL) 200 MG 12 hr capsule Take 200 mg by mouth 2 (two) times daily.     . clobetasol (TEMOVATE) 0.05 % GEL Apply 1 application topically 2 (two) times daily as needed (rash).     . colchicine 0.6 MG tablet Take 1 tablet (0.6 mg total) by mouth 2 (two) times daily. 60 tablet 0  . diltiazem (CARDIZEM CD) 360 MG 24 hr capsule Take 1 capsule (360 mg total) by mouth at bedtime. 30 capsule 0  . donepezil (ARICEPT) 10 MG tablet Take 10 mg by mouth at bedtime.     . folic acid (FOLVITE) 1 MG tablet Take 1 mg by mouth  daily.    . furosemide (LASIX) 40 MG tablet Take 1 tablet (40 mg total) by mouth daily. 30 tablet 0  . gabapentin (NEURONTIN) 300 MG capsule Take 300 mg by mouth 2 (two) times daily.    Marland Kitchen HUMALOG 100 UNIT/ML injection INJECT 0.78ML (78UNITS) INTO THE SKIN VIA V-GO PUMP AS DIRECTED 30 mL 1  . ibuprofen (ADVIL,MOTRIN) 800 MG tablet Take 1 tablet (800 mg total) by mouth 3 (three) times daily. 90 tablet 0  . Insulin Disposable Pump (V-GO 40) KIT USE ONCE DAILY AS DIRECTED 30 kit 1  . INVOKANA 300 MG TABS tablet TAKE ONE TABLET BY MOUTH DAILY BEFORE BREAKFAST 90 tablet 0  . levothyroxine (SYNTHROID, LEVOTHROID) 75 MCG tablet Take 75 mcg by mouth daily before breakfast.     . liraglutide (VICTOZA) 18 MG/3ML SOPN Inject 0.3 mLs (1.8 mg total) into the skin daily. 3 pen 3  . methotrexate (RHEUMATREX) 2.5 MG tablet Take 7.5 mg by mouth every Monday. For rash    . metoprolol tartrate (LOPRESSOR) 25 MG tablet Take 0.5 tablets (12.5 mg total) by mouth 2 (two) times daily. 60 tablet 0  . montelukast (SINGULAIR) 5 MG chewable tablet Chew 1 tablet (5 mg total) by mouth at bedtime. 30 tablet 0  . OLANZapine (ZYPREXA) 5 MG tablet Take 5 mg by mouth at bedtime.    . ondansetron (ZOFRAN) 8 MG tablet Take 1 tablet (8 mg total) by mouth every 8 (eight) hours as needed for nausea. 9 tablet 0  . ONETOUCH DELICA LANCETS 59D MISC USE LANCET TO TEST FIVE TIMES A DAY 200 each 0  . pantoprazole (PROTONIX) 40 MG tablet Take 1 tablet (40 mg total) by mouth daily. 30 tablet 0  . pravastatin (PRAVACHOL) 20 MG tablet Take 20 mg by mouth daily.     No current facility-administered medications for this visit.     Allergies:   Flagyl [metronidazole hcl]; Ciprofloxacin; Metformin and related; Milk-related compounds; and Other    Social History:  The patient  reports that she has never smoked. She has never used smokeless tobacco. She reports that she does not drink alcohol or use drugs.   Family History:  The patient's family  history includes Cancer in her brother and sister; Heart disease in her mother; Parkinson's disease in her father; Stroke in her father.    ROS:  Please see the history of present illness. All other systems are reviewed and negative.    PHYSICAL EXAM: VS:  BP 140/78 (BP Location: Left Arm)   Pulse 89   Ht 5' 3"  (1.6 m)   Wt 216 lb 9.6 oz (98.2 kg)   BMI 38.37  kg/m  , BMI Body mass index is 38.37 kg/m. GEN: Well nourished, well developed, female in no acute distress  HEENT: normal for age  Neck: no JVD, no carotid bruit, no masses Cardiac: RRR; no murmur, no rubs, or gallops Respiratory: Decreased breath sounds bases bilaterally, R>L, normal work of breathing GI: soft, nontender, nondistended, + BS MS: no deformity or atrophy; trace edema; distal pulses are 2+ in all 4 extremities   Skin: warm and dry, no rash Neuro:  Strength and sensation are intact Psych: euthymic mood, full affect   EKG:  EKG is not ordered today.  ECHO: 05/09/2017 - Left ventricle: The cavity size was normal. Wall thickness was   normal. Systolic function was vigorous. The estimated ejection   fraction was in the range of 65% to 70%. Wall motion was normal;   there were no regional wall motion abnormalities. - Pericardium, extracardiac: A moderate pericardial effusion was   identified. There was no evidence of hemodynamic compromise.  Recent Labs: 05/12/2017: B Natriuretic Peptide 139.3 05/17/2017: Hemoglobin 11.2; Platelets 444 05/20/2017: ALT 29; BUN 11; Creatinine, Ser 0.86; Potassium 3.8; Sodium 132; TSH 1.78    Lipid Panel    Component Value Date/Time   CHOL 149 03/26/2017 1710   TRIG 130 03/26/2017 1710   HDL 50 03/26/2017 1710   CHOLHDL 3.0 03/26/2017 1710   VLDL 26 03/26/2017 1710   LDLCALC 73 03/26/2017 1710     Wt Readings from Last 3 Encounters:  05/31/17 216 lb 9.6 oz (98.2 kg)  05/26/17 223 lb (101.2 kg)  05/24/17 225 lb 9.6 oz (102.3 kg)     Other studies Reviewed: Additional  studies/ records that were reviewed today include: Office notes, hospital records and testing.  ASSESSMENT AND PLAN:  1.  Pericarditis: Her chest pain has resolved.  She is not having side effects from the colchicine, continue it for now.  2.  Pleural effusion: Her chest x-ray was recently checked pulmonary office, a repeat is to be done in a month.  The chest x-ray report describes left lower lobe atelectasis and possible pneumonia.  However, she is not having any cough or fever so suspect it is just atelectasis.  She and her son understand that we are happy to let the pulmonary team take the lead on the effusion.  If her breathing gets worse, we will be happy to help as needed.  3.  Deconditioning: This is significant and she is encouraged to increase activity as tolerated.  He understands that she is going to need to push herself to make progress.  4.  Hypertension: Increase metoprolol to 25 mg twice daily.  5.  Pericardial effusion: Felt secondary to the pericarditis, should improve with resolution of the pericarditis.  Dr. Sallyanne Kuster to address when her echocardiogram should be rechecked.   Current medicines are reviewed at length with the patient today.  The patient does not have concerns regarding medicines.  The following changes have been made:  no change  Labs/ tests ordered today include:  No orders of the defined types were placed in this encounter.    Disposition:   FU with Dr. Sallyanne Kuster  Signed, Rosaria Ferries, PA-C  05/31/2017 4:56 PM    [place] Phone: [phone]; Fax: [phone]  This note was written with the assistance of speech recognition software. Please excuse any transcriptional errors.

## 2017-05-31 NOTE — Patient Instructions (Signed)
MEDICATION INSTRUCTION   INCREASE METOPROLOL TARTRATE 25 MG  ONE TABLET TWICE A DAY NO OTHER CHANGES.    DIET INTAKE   UP TO 500 MG SALT/SODIUM  INTAKE WITH [facility] MEAL     Your physician recommends that you weigh, daily, at the same time every day, and in the same amount of clothing. Please record your daily weights on the handout provided and bring it to your next appointment.     Your physician recommends that you schedule a follow-up appointment WITH DR CROITORU AT NEXT AVAILABLE TIME IN THE OFFICE.

## 2017-06-01 NOTE — Progress Notes (Signed)
I would check her echo no sooner than 3 months - probably some time in August (limited 2D study for effusion) Thanks MCr

## 2017-06-06 ENCOUNTER — Telehealth: Payer: Self-pay | Admitting: Physician Assistant

## 2017-06-06 NOTE — Telephone Encounter (Signed)
Returned the call to the patient. She stated that she could not get her Metoprolol filled because her pharmacy said she was allergic to it. She has been on this medication for three weeks now and has not had any reactions. She stated that she would still like to take this medication since she is not having any reactions.   Call was placed to her pharmacy. They stated that they did fill her prescription already and it was ready for pick up.

## 2017-06-06 NOTE — Telephone Encounter (Signed)
New Message:   Please call,concerning her Metoprolol. It is stated In her chart that she was allergic to Metoprolol,she said she never she was.

## 2017-06-15 ENCOUNTER — Other Ambulatory Visit: Payer: Self-pay | Admitting: Endocrinology

## 2017-06-17 LAB — FUNGUS CULTURE WITH STAIN

## 2017-06-17 LAB — FUNGAL ORGANISM REFLEX

## 2017-06-17 LAB — FUNGUS CULTURE RESULT

## 2017-06-20 ENCOUNTER — Telehealth: Payer: Self-pay | Admitting: Physician Assistant

## 2017-06-20 DIAGNOSIS — I3139 Other pericardial effusion (noninflammatory): Secondary | ICD-10-CM

## 2017-06-20 DIAGNOSIS — I313 Pericardial effusion (noninflammatory): Secondary | ICD-10-CM

## 2017-06-20 NOTE — Telephone Encounter (Signed)
Please continue the colchicine (total of 3 months) and please schedule for another limited echo for pericardial effusion. MCr

## 2017-06-20 NOTE — Telephone Encounter (Signed)
Pt forgot 1 medication that needs refill   *STAT* If patient is at the pharmacy, call can be transferred to refill team.   1. Which medications need to be refilled? (please list name of each medication and dose if known) Furosemide 40mg    2. Which pharmacy/location (including street and city if local pharmacy) is medication to be sent to?[facility] [address]  3. Do they need a 30 day or 90 day supply?30   Pt need to know if she need to continue to take the medication. Please advise pt.

## 2017-06-20 NOTE — Telephone Encounter (Signed)
Spoke with pt who states she was put on ibuprofen 800 mg three times a day for pericarditis. Pt is calling to see if she is to continue with the medication.   Routed to Dr. Salena Saner

## 2017-06-20 NOTE — Telephone Encounter (Signed)
New Message   *STAT* If patient is at the pharmacy, call can be transferred to refill team.   1. Which medications need to be refilled? (please list name of each medication and dose if known) ibuprofen (ADVIL,MOTRIN) 800 MG tablet, colchicine 0.6 MG tablet, furosemide (LASIX) 40 MG tablet, pantoprazole (PROTONIX) 40 MG tablet, montelukast (SINGULAIR) 5 MG chewable tablet  2. Which pharmacy/location (including street and city if local pharmacy) is medication to be sent to? [facility] [employer] [address] - [place], [place] [address]  3. Do they need a 30 day or 90 day supply? 90

## 2017-06-20 NOTE — Telephone Encounter (Signed)
No, I think it is time to stop the ibuprofen MCr

## 2017-06-21 ENCOUNTER — Other Ambulatory Visit: Payer: Self-pay | Admitting: Internal Medicine

## 2017-06-21 ENCOUNTER — Telehealth: Payer: Self-pay | Admitting: Cardiovascular Disease

## 2017-06-21 MED ORDER — FUROSEMIDE 40 MG PO TABS: 40.0000 mg | ORAL_TABLET | Freq: Every day | ORAL | 3 refills | Status: DC

## 2017-06-21 MED ORDER — COLCHICINE 0.6 MG PO TABS: 0.6000 mg | ORAL_TABLET | Freq: Two times a day (BID) | ORAL | 3 refills | Status: DC

## 2017-06-21 NOTE — Telephone Encounter (Signed)
Pt updated with Dr. Renaye Rakers's recommendation to DC Ibuprofen, continue with colchicine for a total of 3 months and repeat limited echo. Pt verbalized understanding with no further questions. Orders placed and message sent to scheduling to schedule testing.

## 2017-06-21 NOTE — Telephone Encounter (Signed)
New Message:      Pt c/o medication issue:  1. Name of Medication: furosemide (LASIX) 40 MG tablet  montelukast (SINGULAIR) 5 MG chewable tablet  pantoprazole (PROTONIX) 40 MG tablet  2. How are you currently taking this medication (dosage and times per day)?  Take 1 tablet (40 mg total) by mouth daily.  Chew 1 tablet (5 mg total) by mouth at bedtime.  Take 1 tablet (40 mg total) by mouth daily.  3. Are you having a reaction (difficulty breathing--STAT)? No  4. What is your medication issue? Pt is calling and stating she needs to know if she needs to continue taking this medication and if so she will soon need a refills

## 2017-06-21 NOTE — Telephone Encounter (Signed)
Spoke with pt and advised that she is to continue with current medication except ibuprofen based on last office visit but would need to contact pcp for singular and Protonix. Refill e-sent to pharmacy for lasix and cholchine. Pt verbalized understanding.

## 2017-06-22 ENCOUNTER — Other Ambulatory Visit: Payer: Self-pay

## 2017-06-22 ENCOUNTER — Encounter: Payer: Self-pay | Admitting: Pulmonary Disease

## 2017-06-22 ENCOUNTER — Observation Stay (HOSPITAL_COMMUNITY)
Admission: EM | Admit: 2017-06-22 | Discharge: 2017-06-23 | Disposition: A | Discharge status: Home Health | Payer: Medicare Other | Attending: Family Medicine | Admitting: Family Medicine

## 2017-06-22 ENCOUNTER — Encounter (HOSPITAL_COMMUNITY): Payer: Self-pay | Admitting: Emergency Medicine

## 2017-06-22 ENCOUNTER — Ambulatory Visit (INDEPENDENT_AMBULATORY_CARE_PROVIDER_SITE_OTHER): Payer: Medicare Other | Admitting: Pulmonary Disease

## 2017-06-22 ENCOUNTER — Emergency Department (HOSPITAL_COMMUNITY): Payer: Medicare Other

## 2017-06-22 DIAGNOSIS — R262 Difficulty in walking, not elsewhere classified: Secondary | ICD-10-CM | POA: Insufficient documentation

## 2017-06-22 DIAGNOSIS — E119 Type 2 diabetes mellitus without complications: Secondary | ICD-10-CM | POA: Diagnosis not present

## 2017-06-22 DIAGNOSIS — J90 Pleural effusion, not elsewhere classified: Secondary | ICD-10-CM

## 2017-06-22 DIAGNOSIS — R404 Transient alteration of awareness: Secondary | ICD-10-CM | POA: Diagnosis not present

## 2017-06-22 DIAGNOSIS — Z79899 Other long term (current) drug therapy: Secondary | ICD-10-CM | POA: Diagnosis not present

## 2017-06-22 DIAGNOSIS — I10 Essential (primary) hypertension: Secondary | ICD-10-CM | POA: Insufficient documentation

## 2017-06-22 DIAGNOSIS — Z7982 Long term (current) use of aspirin: Secondary | ICD-10-CM | POA: Diagnosis not present

## 2017-06-22 DIAGNOSIS — R531 Weakness: Secondary | ICD-10-CM | POA: Insufficient documentation

## 2017-06-22 DIAGNOSIS — N390 Urinary tract infection, site not specified: Secondary | ICD-10-CM

## 2017-06-22 DIAGNOSIS — E785 Hyperlipidemia, unspecified: Secondary | ICD-10-CM | POA: Insufficient documentation

## 2017-06-22 DIAGNOSIS — R2689 Other abnormalities of gait and mobility: Secondary | ICD-10-CM | POA: Insufficient documentation

## 2017-06-22 DIAGNOSIS — I129 Hypertensive chronic kidney disease with stage 1 through stage 4 chronic kidney disease, or unspecified chronic kidney disease: Secondary | ICD-10-CM | POA: Diagnosis present

## 2017-06-22 DIAGNOSIS — E782 Mixed hyperlipidemia: Secondary | ICD-10-CM | POA: Diagnosis present

## 2017-06-22 DIAGNOSIS — R0789 Other chest pain: Secondary | ICD-10-CM | POA: Insufficient documentation

## 2017-06-22 DIAGNOSIS — Z794 Long term (current) use of insulin: Secondary | ICD-10-CM | POA: Diagnosis not present

## 2017-06-22 DIAGNOSIS — G4733 Obstructive sleep apnea (adult) (pediatric): Secondary | ICD-10-CM | POA: Insufficient documentation

## 2017-06-22 DIAGNOSIS — N179 Acute kidney failure, unspecified: Secondary | ICD-10-CM | POA: Diagnosis not present

## 2017-06-22 DIAGNOSIS — R55 Syncope and collapse: Secondary | ICD-10-CM | POA: Diagnosis not present

## 2017-06-22 DIAGNOSIS — E7800 Pure hypercholesterolemia, unspecified: Secondary | ICD-10-CM | POA: Diagnosis not present

## 2017-06-22 DIAGNOSIS — R0602 Shortness of breath: Secondary | ICD-10-CM | POA: Diagnosis not present

## 2017-06-22 HISTORY — DX: Obstructive sleep apnea (adult) (pediatric): G47.33

## 2017-06-22 HISTORY — DX: Personal history of other diseases of the digestive system: Z87.19

## 2017-06-22 HISTORY — DX: Transient cerebral ischemic attack, unspecified: G45.9

## 2017-06-22 HISTORY — DX: Personal history of urinary calculi: Z87.442

## 2017-06-22 HISTORY — DX: Type 2 diabetes mellitus without complications: E11.9

## 2017-06-22 HISTORY — DX: Dependence on other enabling machines and devices: Z99.89

## 2017-06-22 HISTORY — DX: Bipolar disorder, unspecified: F31.9

## 2017-06-22 HISTORY — DX: Personal history of peptic ulcer disease: Z87.11

## 2017-06-22 HISTORY — DX: Pneumonia, unspecified organism: J18.9

## 2017-06-22 HISTORY — DX: Hypothyroidism, unspecified: E03.9

## 2017-06-22 LAB — URINALYSIS, ROUTINE W REFLEX MICROSCOPIC
BILIRUBIN URINE: NEGATIVE
Glucose, UA: >=500 mg/dL — AB
KETONES UR: NEGATIVE mg/dL
NITRITE: NEGATIVE
PH: 5 (ref 5.0–8.0)
Protein, ur: NEGATIVE mg/dL
Specific Gravity, Urine: 1.01 (ref 1.005–1.030)

## 2017-06-22 LAB — I-STAT TROPONIN, ED: Troponin i, poc: 0 ng/mL (ref 0.00–0.08)

## 2017-06-22 LAB — BASIC METABOLIC PANEL
ANION GAP: 12 (ref 5–15)
BUN: 24 mg/dL — ABNORMAL HIGH (ref 6–20)
CO2: 22 mmol/L (ref 22–32)
Calcium: 9.4 mg/dL (ref 8.9–10.3)
Chloride: 104 mmol/L (ref 101–111)
Creatinine, Ser: 1.01 mg/dL — ABNORMAL HIGH (ref 0.44–1.00)
GFR calc non Af Amer: 53 mL/min — ABNORMAL LOW (ref >60)
GLUCOSE: 132 mg/dL — AB (ref 65–99)
POTASSIUM: 3.8 mmol/L (ref 3.5–5.1)
Sodium: 138 mmol/L (ref 135–145)

## 2017-06-22 LAB — D-DIMER, QUANTITATIVE: D-Dimer, Quant: 0.62 ug/mL-FEU — ABNORMAL HIGH (ref 0.00–0.50)

## 2017-06-22 LAB — CBC
HEMATOCRIT: 39.8 % (ref 36.0–46.0)
HEMOGLOBIN: 12.3 g/dL (ref 12.0–15.0)
MCH: 24.8 pg — ABNORMAL LOW (ref 26.0–34.0)
MCHC: 30.9 g/dL (ref 30.0–36.0)
MCV: 80.2 fL (ref 78.0–100.0)
Platelets: 486 10*3/uL — ABNORMAL HIGH (ref 150–400)
RBC: 4.96 MIL/uL (ref 3.87–5.11)
RDW: 17.1 % — ABNORMAL HIGH (ref 11.5–15.5)
WBC: 9.7 10*3/uL (ref 4.0–10.5)

## 2017-06-22 LAB — GLUCOSE, CAPILLARY: GLUCOSE-CAPILLARY: 106 mg/dL — AB (ref 65–99)

## 2017-06-22 LAB — TROPONIN I: Troponin I: <0.03 ng/mL (ref <0.03)

## 2017-06-22 MED ORDER — SODIUM CHLORIDE 0.9 % IV SOLN
1.0000 g | Freq: Once | INTRAVENOUS | Status: AC
  Administered 2017-06-22: 1 g via INTRAVENOUS
  Filled 2017-06-22: qty 10

## 2017-06-22 MED ORDER — CARBAMAZEPINE ER 200 MG PO TB12
200.0000 mg | ORAL_TABLET | Freq: Two times a day (BID) | ORAL | Status: DC
  Administered 2017-06-23 (×2): 200 mg via ORAL
  Filled 2017-06-22 (×3): qty 1

## 2017-06-22 MED ORDER — COLCHICINE 0.6 MG PO TABS
0.6000 mg | ORAL_TABLET | Freq: Two times a day (BID) | ORAL | Status: DC
  Administered 2017-06-22 – 2017-06-23 (×2): 0.6 mg via ORAL
  Filled 2017-06-22 (×2): qty 1

## 2017-06-22 MED ORDER — SODIUM CHLORIDE 0.9 % IV SOLN
1.0000 g | Freq: Once | INTRAVENOUS | Status: DC
  Filled 2017-06-22: qty 10

## 2017-06-22 MED ORDER — ACETAMINOPHEN 650 MG RE SUPP: 650.0000 mg | Freq: Four times a day (QID) | RECTAL | Status: DC | PRN

## 2017-06-22 MED ORDER — INSULIN ASPART 100 UNIT/ML ~~LOC~~ SOLN: 0.0000 [IU] | SUBCUTANEOUS | Status: DC

## 2017-06-22 MED ORDER — PRAVASTATIN SODIUM 20 MG PO TABS: 20.0000 mg | ORAL_TABLET | Freq: Every day | ORAL | Status: DC

## 2017-06-22 MED ORDER — LEVOTHYROXINE SODIUM 75 MCG PO TABS
75.0000 ug | ORAL_TABLET | Freq: Every day | ORAL | Status: DC
  Administered 2017-06-23: 75 ug via ORAL
  Filled 2017-06-22: qty 1

## 2017-06-22 MED ORDER — SODIUM CHLORIDE 0.9% FLUSH
3.0000 mL | Freq: Two times a day (BID) | INTRAVENOUS | Status: DC
  Administered 2017-06-22 – 2017-06-23 (×2): 3 mL via INTRAVENOUS

## 2017-06-22 MED ORDER — SODIUM CHLORIDE 0.9 % IV SOLN
100.0000 mL/h | INTRAVENOUS | Status: AC
  Administered 2017-06-22: 100 mL/h via INTRAVENOUS

## 2017-06-22 MED ORDER — GABAPENTIN 300 MG PO CAPS
300.0000 mg | ORAL_CAPSULE | Freq: Two times a day (BID) | ORAL | Status: DC
  Administered 2017-06-22 – 2017-06-23 (×2): 300 mg via ORAL
  Filled 2017-06-22 (×2): qty 1

## 2017-06-22 MED ORDER — FOLIC ACID 1 MG PO TABS
1.0000 mg | ORAL_TABLET | Freq: Every day | ORAL | Status: DC
  Administered 2017-06-23: 1 mg via ORAL
  Filled 2017-06-22: qty 1

## 2017-06-22 MED ORDER — ONDANSETRON HCL 4 MG/2ML IJ SOLN: 4.0000 mg | Freq: Four times a day (QID) | INTRAMUSCULAR | Status: DC | PRN

## 2017-06-22 MED ORDER — ASPIRIN EC 81 MG PO TBEC
81.0000 mg | DELAYED_RELEASE_TABLET | Freq: Every day | ORAL | Status: DC
  Administered 2017-06-23: 81 mg via ORAL
  Filled 2017-06-22: qty 1

## 2017-06-22 MED ORDER — ACETAMINOPHEN 325 MG PO TABS: 650.0000 mg | ORAL_TABLET | Freq: Four times a day (QID) | ORAL | Status: DC | PRN

## 2017-06-22 MED ORDER — ONDANSETRON HCL 4 MG PO TABS: 4.0000 mg | ORAL_TABLET | Freq: Four times a day (QID) | ORAL | Status: DC | PRN

## 2017-06-22 MED ORDER — DONEPEZIL HCL 10 MG PO TABS
10.0000 mg | ORAL_TABLET | Freq: Every day | ORAL | Status: DC
  Administered 2017-06-22: 10 mg via ORAL
  Filled 2017-06-22: qty 1

## 2017-06-22 MED ORDER — OLANZAPINE 5 MG PO TABS
5.0000 mg | ORAL_TABLET | Freq: Every day | ORAL | Status: DC
  Administered 2017-06-23: 5 mg via ORAL
  Filled 2017-06-22 (×2): qty 1

## 2017-06-22 MED ORDER — SODIUM CHLORIDE 0.9 % IV BOLUS
1000.0000 mL | Freq: Once | INTRAVENOUS | Status: AC
  Administered 2017-06-22: 1000 mL via INTRAVENOUS

## 2017-06-22 MED ORDER — PANTOPRAZOLE SODIUM 40 MG PO TBEC
40.0000 mg | DELAYED_RELEASE_TABLET | Freq: Every day | ORAL | Status: DC
  Administered 2017-06-23: 40 mg via ORAL
  Filled 2017-06-22: qty 1

## 2017-06-22 NOTE — Progress Notes (Addendum)
Subjective:    Patient ID: [name], female    DOB: [birth year], 77 y.o.   MRN: [id number]  HPI 77 year old female never smoker seen for sleep consult February 2019 to establish for OSA She has been maintained on CPAP 10 cm since 2010   She was admitted 04/2017 for progressive shortness of breath.  She was found to have a large pericardial effusion and left pleural effusion.  She was treated with anti-inflammatories & colchicine. She underwent a left thoracentesis cytology was positive for reactive cells, low LDH but prot 3.6 >>.  Was seen by cardiology and felt that this was possibly viral in nature.  ANA was negative.  ESR and CRP were elevated.  2D echo on April 22 showed no increase in pericardial effusion size.  No evidence of Tamponade.  She was treated with diuresis.  She is a diabetic hypertensive, has mood disorders on carbamazepine and Zyprexa and is on methotrexate for a skin rash by dermatology  She continued to have staring episodes and would feel like she is in a fog in the morning with delayed reaction to [facility] neurology, EEG was negative. She had a repeat sleep study which showed AHI of 24/hour, supine AHI was 34/hour  CPAP download from 05/2017 was reviewed which shows excellent compliance since discharge from the hospital on 10 cm about 9 hours with no residual events and minimal leak   She feels much improved overall, dyspnea is resolved, pedal edema is much improved, she continues on Lasix daily, has minimal diarrhea with colchicine and she seems to be tolerating well, feels refreshed in the morning on waking up. She would like to get a new CPAP machine and supplies  Significant tests/ events reviewed  NPSG ([place] ) 05/2017 AHI 24/h, supine AHI 34/h  Towards the end of the visit, she is only got cold and clammy, diaphoretic and lost consciousness briefly with blinking movements of her eyes and was unresponsive for a few seconds, systolic blood pressure could not be  obtained, CBG was 136 -Laid down on the table, consciousness improved within few seconds and she was following commands Of note, she is on an insulin pump  Past Medical History:  Diagnosis Date  . Depression    Bipolar  . Diabetes mellitus    diet controlled  . GERD (gastroesophageal reflux disease)   . Hyperlipidemia   . Hypertension   . Ischemic colitis, enteritis, or enterocolitis ([place])   . Pericarditis 05/06/2017  . Sleep apnea   . Stroke ([place])    x's 2  . Thyroid disease   . Urinary bladder incontinence   . Vertigo       Review of Systems neg for any significant sore throat, dysphagia, itching, sneezing, nasal congestion or excess/ purulent secretions, fever, chills, sweats, unintended wt loss, pleuritic or exertional cp, hempoptysis, orthopnea pnd or change in chronic leg swelling. Also denies presyncope, palpitations, heartburn, abdominal pain, nausea, vomiting, diarrhea or change in bowel or urinary habits, dysuria,hematuria, rash, arthralgias, visual complaints, headache, numbness weakness or ataxia.     Objective:   Physical Exam  Gen. Pleasant, obese, in no distress ENT - class 2 airway, no post nasal drip Neck: No JVD, no thyromegaly, no carotid bruits Lungs: no use of accessory muscles, no dullness to percussion, decreased without rales or rhonchi  Cardiovascular: Rhythm regular, heart sounds  normal, no murmurs or gallops, no peripheral edema Musculoskeletal: No deformities, no cyanosis or clubbing , no tremors  Assessment & Plan:

## 2017-06-22 NOTE — ED Provider Notes (Signed)
[facility]GENC[facility] DEPARTMENT Provider Note   CSN: [id number] Arrival date & time: 06/22/17  1330     History   Chief Complaint Chief Complaint  Patient presents with  . Loss of Consciousness    HPI [name] is a 77 y.o. female.  HPI  [name] is a 77yo female with a history of coronary artery disease, type 2 diabetes (uses insulin pump), hypertension, hyperlipidemia, OSA, obesity, TIA x2, hypothyroidism who presents to the emergency department via EMS from her pulmonology appointment for evaluation after a presyncopal episode.  Patient reports that she was at pulmonology appointment for checkup after being admitted 04/2017 for pericardial effusion and pulmonary effusion for which she had a thoracentesis and ultimately thought to be due to viral syndrome.  Patient reports that she was sitting in her chair and was finishing up the appointment.  States that shortly after the doctor walked out of the room she felt clammy, diaphoretic, lightheaded and as if the room was spinning around her.  Her son was with her at the time, states that several office staff came in and moved her to a laying position.  She was apparently shaking both of her hands, although this is not unusual for her to do per her son.  Patient denies losing consciousness, states that she felt foggy headed, but remembers everything that happened. According to son at bedside she was responding, just more slowly than usual. She denies associated chest pain, shortness of breath, focal numbness or weakness, slurred speech. Reports that the episode lasted about 33mn total. Now feels better, but still "foggy and trouble concentrating." Denies new medications. According to Pulmonologist note her CBG was 136 at the time. No history of tongue biting or incontinence. Patient states she has chest pain and sob on exertion since she was discharged 04/2017 and this is unchanged from baseline. Denies history of  DVT/PE, unilateral calf swelling or tenderness, exogenous estrogen, recent surgery, active cancer.   Past Medical History:  Diagnosis Date  . Depression    Bipolar  . Diabetes mellitus    diet controlled  . GERD (gastroesophageal reflux disease)   . Hyperlipidemia   . Hypertension   . Ischemic colitis, enteritis, or enterocolitis ([facility]   . Pericarditis 05/06/2017  . Sleep apnea   . Stroke ([facility]    x's 2  . Thyroid disease   . Urinary bladder incontinence   . Vertigo     Patient Active Problem List   Diagnosis Date Noted  . Staring episodes 05/21/2017  . Hyponatremia 05/21/2017  . Bilateral pleural effusion   . Pleural effusion on left   . Cough   . Pericarditis 05/06/2017  . Pericardial effusion 05/04/2017  . OSA (obstructive sleep apnea) 02/18/2017  . Dyspnea 01/27/2015  . Atypical chest pain 12/24/2014  . Accelerating angina ([facility] 12/23/2014  . Chest pain at rest 10/30/2013  . Exertional dyspnea 10/30/2013  . Hyperlipidemia 10/30/2013  . Morbid obesity ([facility] 10/30/2013  . Acquired autoimmune hypothyroidism 09/02/2013  . Type 2 diabetes mellitus without complications ([place] [id number]/[id number] . Essential hypertension, benign 06/14/2013  . Hyperlipemia 06/14/2013    Past Surgical History:  Procedure Laterality Date  . ABDOMINAL HYSTERECTOMY     partial   . APPENDECTOMY    . CARDIAC CATHETERIZATION N/A 12/24/2014   Procedure: Right/Left Heart Cath and Coronary Angiography;  Surgeon: MSanda Klein MD;  Location: [facility];  Service: Cardiovascular;  Laterality: N/A;  . CHOLECYSTECTOMY    .  FRACTURE SURGERY    . HEMORRHOID SURGERY    . IR THORACENTESIS ASP PLEURAL SPACE W/IMG GUIDE  05/13/2017  . RETINAL DETACHMENT SURGERY    . TONSILLECTOMY       OB History   None      Home Medications    Prior to Admission medications   Medication Sig Start Date End Date Taking? Authorizing Provider  aspirin EC 81 MG EC tablet Take 1 tablet (81 mg total) by mouth  daily. 05/18/17   Shelly Coss, MD  betamethasone dipropionate (DIPROLENE) 0.05 % cream Apply 1 application topically 2 (two) times daily as needed (rash).     [provider]  carbamazepine (CARBATROL) 200 MG 12 hr capsule Take 200 mg by mouth 2 (two) times daily.     [provider]  clobetasol (TEMOVATE) 0.05 % GEL Apply 1 application topically 2 (two) times daily as needed (rash).     [provider]  colchicine 0.6 MG tablet Take 1 tablet (0.6 mg total) by mouth 2 (two) times daily. 06/21/17   Croitoru, Mihai, MD  diltiazem (CARDIZEM CD) 360 MG 24 hr capsule Take 1 capsule (360 mg total) by mouth at bedtime. 05/17/17   Shelly Coss, MD  donepezil (ARICEPT) 10 MG tablet Take 10 mg by mouth at bedtime.     [provider]  folic acid (FOLVITE) 1 MG tablet Take 1 mg by mouth daily.    [provider]  furosemide (LASIX) 40 MG tablet Take 1 tablet (40 mg total) by mouth daily. 06/21/17   Croitoru, Mihai, MD  gabapentin (NEURONTIN) 300 MG capsule Take 300 mg by mouth 2 (two) times daily.    [provider]  HUMALOG 100 UNIT/ML injection INJECT .78 ML (78 UNITS) INTO THE SKIN VIA V-GO PUMP AS DIRECTEDQ 06/15/17   Elayne Snare, MD  Insulin Disposable Pump (V-GO 40) KIT USE ONCE DAILY AS DIRECTED 06/15/17   Elayne Snare, MD  INVOKANA 300 MG TABS tablet TAKE ONE TABLET BY MOUTH DAILY BEFORE BREAKFAST 04/25/17   Elayne Snare, MD  levothyroxine (SYNTHROID, LEVOTHROID) 75 MCG tablet Take 75 mcg by mouth daily before breakfast.     [provider]  liraglutide (VICTOZA) 18 MG/3ML SOPN Inject 0.3 mLs (1.8 mg total) into the skin daily. 02/24/17   Elayne Snare, MD  methotrexate (RHEUMATREX) 2.5 MG tablet Take 7.5 mg by mouth every Monday. For rash    [provider]  metoprolol tartrate (LOPRESSOR) 25 MG tablet Take 1 tablet (25 mg total) by mouth 2 (two) times daily. 05/31/17 08/29/17  Barrett, Evelene Croon, PA-C  OLANZapine (ZYPREXA) 5 MG tablet Take 5  mg by mouth at bedtime.    [provider]  ondansetron (ZOFRAN) 8 MG tablet Take 1 tablet (8 mg total) by mouth every 8 (eight) hours as needed for nausea. 05/08/12   Dorie Rank, MD  [facility] DELICA LANCETS 21H MISC USE LANCET TO TEST FIVE TIMES A DAY 05/31/17   Elayne Snare, MD  pantoprazole (PROTONIX) 40 MG tablet Take 1 tablet (40 mg total) by mouth daily. 05/18/17   Shelly Coss, MD  pravastatin (PRAVACHOL) 20 MG tablet Take 20 mg by mouth daily.    [provider]    Family History Family History  Problem Relation Age of Onset  . Heart disease Mother   . Stroke Father   . Parkinson's disease Father   . Cancer Sister   . Cancer Brother     Social History Social History  Tobacco Use  . Smoking status: Never Smoker  . Smokeless tobacco: Never Used  Substance Use Topics  . Alcohol use: No  . Drug use: No     Allergies   Flagyl [metronidazole hcl]; Ciprofloxacin; Metformin and related; Milk-related compounds; and Other   Review of Systems Review of Systems  Constitutional: Positive for diaphoresis. Negative for chills and fever.  HENT: Negative for congestion, sore throat and trouble swallowing.   Eyes: Positive for visual disturbance (bilateral blurred vision, denies diplopia).  Respiratory: Positive for shortness of breath (on exertion, chronic). Negative for cough and wheezing.   Cardiovascular: Positive for chest pain (on exertion, chronic). Negative for leg swelling.  Gastrointestinal: Negative for abdominal pain, nausea and vomiting.  Genitourinary: Negative for difficulty urinating, dyspareunia and frequency.  Musculoskeletal: Negative for arthralgias.  Skin: Negative for rash.  Neurological: Positive for dizziness and light-headedness. Negative for syncope, facial asymmetry, speech difficulty, weakness, numbness and headaches.  Psychiatric/Behavioral: Negative for agitation.     Physical Exam Updated Vital Signs BP (!) 97/50 (BP Location:  Right Arm)   Pulse (!) 56   Temp 98 F (36.7 C) (Oral)   Resp 14   SpO2 94%   Physical Exam  Constitutional: She is oriented to person, place, and time. She appears well-developed and well-nourished. No distress.  Afebrile and non-toxic appearing.   HENT:  Head: Normocephalic and atraumatic.  Mouth/Throat: Oropharynx is clear and moist. No oropharyngeal exudate.  Eyes: Pupils are equal, round, and reactive to light. Conjunctivae are normal. Right eye exhibits no discharge. Left eye exhibits no discharge.  Neck: Normal range of motion. Neck supple. No JVD present. No tracheal deviation present.  Cardiovascular: Normal rate, regular rhythm and intact distal pulses.  Pulmonary/Chest: Effort normal and breath sounds normal. No stridor. No respiratory distress. She has no wheezes. She has no rales.  Abdominal: Soft. There is no tenderness.  Musculoskeletal:  No pitting edema or tenderness over bilateral calves.   Neurological: She is alert and oriented to person, place, and time. Coordination normal.  Mental Status:  Alert, oriented, thought content appropriate, able to give a coherent history. Speech fluent without evidence of aphasia. Able to follow 2 step commands without difficulty.  Cranial Nerves:  II:  Peripheral visual fields grossly normal, pupils equal, round, reactive to light III,IV, VI: ptosis not present, extra-ocular motions intact bilaterally  V,VII: smile symmetric, facial light touch sensation equal VIII: hearing grossly normal to voice  X: uvula elevates symmetrically  XI: bilateral shoulder shrug symmetric and strong XII: midline tongue extension without fassiculations Motor:  5/5 in upper and lower extremities bilaterally including strong and equal grip strength and dorsiflexion/plantar flexion Sensory: Pinprick and light touch normal in all extremities.  Cerebellar: normal finger-to-nose with bilateral upper extremities CV: distal pulses palpable throughout     Skin: Skin is warm and dry. Capillary refill takes less than 2 seconds. She is not diaphoretic.  Psychiatric: She has a normal mood and affect. Her behavior is normal.  Nursing note and vitals reviewed.    ED Treatments / Results  Labs (all labs ordered are listed, but only abnormal results are displayed) Labs Reviewed  BASIC METABOLIC PANEL - Abnormal; Notable for the following components:      Result Value   Glucose, Bld 132 (*)    BUN 24 (*)    Creatinine, Ser 1.01 (*)    GFR calc non Af Amer 53 (*)    All other components within normal limits  CBC -  Abnormal; Notable for the following components:   MCH 24.8 (*)    RDW 17.1 (*)    Platelets 486 (*)    All other components within normal limits  URINALYSIS, ROUTINE W REFLEX MICROSCOPIC  CBG MONITORING, ED  I-STAT TROPONIN, ED    EKG EKG Interpretation  Date/Time:  Wednesday June 22 2017 13:39:03 EDT Ventricular Rate:  57 PR Interval:  180 QRS Duration: 84 QT Interval:  390 QTC Calculation: 379 R Axis:   -60 Text Interpretation:  Sinus bradycardia Left axis deviation Anterior infarct , age undetermined T wave abnormality, consider lateral ischemia Abnormal ECG No significant change since last tracing Confirmed by Deno Etienne [phone]) on 06/22/2017 4:29:20 PM   Radiology No results found.  Procedures Procedures (including critical care time)  Medications Ordered in ED Medications  sodium chloride 0.9 % bolus 1,000 mL (has no administration in time range)     Initial Impression / Assessment and Plan / ED Course  I have reviewed the triage vital signs and the nursing notes.  Pertinent labs & imaging results that were available during my care of the patient were reviewed by me and considered in my medical decision making (see chart for details).    Patient presents with presyncopal episode earlier today while at a Pulmonology apt. Of note she was recently admitted 04/2017 for pericardial effusion and left pleural  effusion in which she had a thoracentesis with cytology results suggesting viral etiology.  No history prior seizures. No history of tongue biting or bowel incontinence. She was aware of the event and responsive. No report of confusion afterwards. States she felt lightheaded, dizzy and clammy. She has been hypotensive since arrival 97/50. Given history feel that her episode was more likely syncopal versus seizure activity. No focal numbness, weakness, dysarthria to suggest acute stroke and do not think head imaging is indicated at this time.    IV fluid bolus started. Labs obtained including BMP, CBC, Trop, Urine. Will also get CXR and EKG.   Labs reviewed. BMP unremarkable. UA appears infected. Rocephin IV ordered. CBC unremarkable, no leukocytosis. Troponin negative, EKG non-ischemic. Do not suspect PE given patient denies feeling new shortness of breath, no tachycardia or tachypnea, no hx of DVT/PE, recent surgery, active cancer, exogenous estrogen.   Patient's CXR with improved cardiomegaly and pleural effusion from prior film when she was admitted. Discussed this patient with Dr. Tyrone Nine who performed bedside echo. No apparent pericardial effusion. Patient receiving second bag of fluids, given persistent hypotension plan to have her admitted for further evaluation and monitoring.   Discussed this patient with Hospitalist Dr. Roel Cluck who will admit to medicine.This was a shared visit with Dr. Tyrone Nine who also saw the patient and agrees with plan and admission.    Final Clinical Impressions(s) / ED Diagnoses   Final diagnoses:  None    ED Discharge Orders    None       Glyn Ade, PA-C 06/23/17 [place], [place], DO 06/23/17 [place], DO 06/23/17 1513

## 2017-06-22 NOTE — Assessment & Plan Note (Addendum)
CXR today Ct colchicine per cards until repeat echo

## 2017-06-22 NOTE — H&P (Signed)
[name] EXN:[id number] DOB: [birth year] DOA: 06/22/2017     PCP: Merrilee Seashore, MD   Outpatient Specialists:   Endocrine Dwyane Dee CARDS:  Dr. Sallyanne Kuster   Pulmonary  Dr. Elsworth Soho   Patient arrived to ER on 06/22/17 at 1330  Patient coming from:   home Lives alone,       Chief Complaint:  Chief Complaint  Patient presents with  . Loss of Consciousness    HPI: [name] is a 77 y.o. female with medical history significant of    DM2 on insulin pump, hx OSA non on CPAP, depression, GERD, HLD, HTN history of ischemic colitis, CVA  Presented with  Presyncopal event while in the office pulmonogy as follow up for plural effusion she was sitting in the chair felt dizzy like the room was spinning felt short of breath, clammy and foggy like in the haze but always aware. No Chest pain. She denies feeling weak on one side, no slurred speech,.  Patient have been responding to them her hands were shaky during event CBG was 136  No chest pain no SOB Recently has been hospitalized for shortness of breath was found to have large pericardial effusion and left pleural effusion treated with anti-inflammatories and colchicine underwent left thoracentesis cytology was positive for reactive cells she has been also followed by cardiology felt to be viral peri-cardial effusion work-up was significant for elevated ESR and CRP 2D echo done in April showed no increase in pericardial effusion size and no tamponade she was diuresed. Pericardial effusion   Regarding pertinent Chronic problems: History of DM type II currently on  V-go pump 40 unit basal, boluses at meal times: 10-27-12 units Also on  Invokana 300 mg and VICTOZA 1.8 mg Sleep apnea not using CPAP. Remote admission for staring episode EEG did not show any evidence of seizures he was taken off Keppra While in ER:  Noted to be hypotensive Bedside echo shoed no pericardial effusion  Following Medications were ordered in  ER: Medications  sodium chloride 0.9 % bolus 1,000 mL (has no administration in time range)  cefTRIAXone (ROCEPHIN) 1 g in sodium chloride 0.9 % 100 mL IVPB (has no administration in time range)  sodium chloride 0.9 % bolus 1,000 mL (1,000 mLs Intravenous New Bag/Given 06/22/17 1817)    Significant initial  Findings: Abnormal Labs Reviewed  BASIC METABOLIC PANEL - Abnormal; Notable for the following components:      Result Value   Glucose, Bld 132 (*)    BUN 24 (*)    Creatinine, Ser 1.01 (*)    GFR calc non Af Amer 53 (*)    All other components within normal limits  CBC - Abnormal; Notable for the following components:   MCH 24.8 (*)    RDW 17.1 (*)    Platelets 486 (*)    All other components within normal limits  URINALYSIS, ROUTINE W REFLEX MICROSCOPIC - Abnormal; Notable for the following components:   APPearance CLOUDY (*)    Glucose, UA >=500 (*)    Hgb urine dipstick SMALL (*)    Leukocytes, UA LARGE (*)    WBC, UA >50 (*)    Bacteria, UA MANY (*)    All other components within normal limits     Na 138 K 3.6  Cr   Up from baseline see below Lab Results  Component Value Date   CREATININE 1.01 (H) 06/22/2017   CREATININE 0.86 05/20/2017   CREATININE 0.93 05/16/2017  AG 12  WBC 9.7  HG/HCT  stable,      Component Value Date/Time   HGB 12.3 06/22/2017 1341   HCT 39.8 06/22/2017 1341    Troponin (Point of Care Test) Recent Labs    06/22/17 1734  TROPIPOC 0.00       BNP (last 3 results) Recent Labs    05/12/17 0816  BNP 139.3*    ProBNP (last 3 results) No results for input(s): PROBNP in the last 8760 hours.  Lactic Acid, Venous    Component Value Date/Time   LATICACIDVEN 1.1 05/14/2017 1359      UA evidence of UTI      CXR - improved     ECG:  Personally reviewed by me showing: HR : 57 Rhythm: sinus bradycardia  nonspecific changes,   QTC 379    ED Triage Vitals  Enc Vitals Group     BP 06/22/17 1334 (!) 100/47      Pulse Rate 06/22/17 1334 (!) 57     Resp 06/22/17 1334 16     Temp 06/22/17 1334 98 F (36.7 C)     Temp Source 06/22/17 1334 Oral     SpO2 06/22/17 1334 98 %     Weight --      Height --      Head Circumference --      Peak Flow --      Pain Score 06/22/17 1345 0     Pain Loc --      Pain Edu? --      Excl. in [place]? --   TMAX(24)@       Latest  Blood pressure (!) 128/55, pulse 68, temperature 98 F (36.7 C), resp. rate 16, SpO2 97 %.   Hospitalist was called for admission for preyncope   Review of Systems:    Pertinent positives include:  fatigue,   Constitutional:  No weight loss, night sweats, Fevers, chills,weight loss  HEENT:  No headaches, Difficulty swallowing,Tooth/dental problems,Sore throat,  No sneezing, itching, ear ache, nasal congestion, post nasal drip,  Cardio-vascular:  No chest pain, Orthopnea, PND, anasarca, dizziness, palpitations.no Bilateral lower extremity swelling  GI:  No heartburn, indigestion, abdominal pain, nausea, vomiting, diarrhea, change in bowel habits, loss of appetite, melena, blood in stool, hematemesis Resp:  no shortness of breath at rest. No dyspnea on exertion, No excess mucus, no productive cough, No non-productive cough, No coughing up of blood.No change in color of mucus.No wheezing. Skin:  no rash or lesions. No jaundice GU:  no dysuria, change in color of urine, no urgency or frequency. No straining to urinate.  No flank pain.  Musculoskeletal:  No joint pain or no joint swelling. No decreased range of motion. No back pain.  Psych:  No change in mood or affect. No depression or anxiety. No memory loss.  Neuro: no localizing neurological complaints, no tingling, no weakness, no double vision, no gait abnormality, no slurred speech, no confusion  As per HPI otherwise 10 point review of systems negative.   Past Medical History:   Past Medical History:  Diagnosis Date  . Depression    Bipolar  . Diabetes mellitus     diet controlled  . GERD (gastroesophageal reflux disease)   . Hyperlipidemia   . Hypertension   . Ischemic colitis, enteritis, or enterocolitis ([place])   . Pericarditis 05/06/2017  . Sleep apnea   . Stroke ([place])    x's 2  . Thyroid disease   . Urinary bladder incontinence   .  Vertigo       Past Surgical History:  Procedure Laterality Date  . ABDOMINAL HYSTERECTOMY     partial   . APPENDECTOMY    . CARDIAC CATHETERIZATION N/A 12/24/2014   Procedure: Right/Left Heart Cath and Coronary Angiography;  Surgeon: Sanda Klein, MD;  Location: [facility];  Service: Cardiovascular;  Laterality: N/A;  . CHOLECYSTECTOMY    . FRACTURE SURGERY    . HEMORRHOID SURGERY    . IR THORACENTESIS ASP PLEURAL SPACE W/IMG GUIDE  05/13/2017  . RETINAL DETACHMENT SURGERY    . TONSILLECTOMY      Social History:  Ambulatory independently       reports that she has never smoked. She has never used smokeless tobacco. She reports that she does not drink alcohol or use drugs.     Family History:   Family History  Problem Relation Age of Onset  . Heart disease Mother   . Stroke Father   . Parkinson's disease Father   . Cancer Sister   . Cancer Brother     Allergies: Allergies  Allergen Reactions  . Flagyl [Metronidazole Hcl] Itching and Rash  . Ciprofloxacin Itching and Rash  . Metformin And Related Rash  . Milk-Related Compounds Other (See Comments)    Stomach pains  . Other Other (See Comments)    [place] nuts cause severe facial redness     Prior to Admission medications   Medication Sig Start Date End Date Taking? Authorizing Provider  aspirin EC 81 MG EC tablet Take 1 tablet (81 mg total) by mouth daily. 05/18/17  Yes Shelly Coss, MD  betamethasone dipropionate (DIPROLENE) 0.05 % cream Apply 1 application topically 2 (two) times daily as needed (rash).    Yes [provider]  carbamazepine (CARBATROL) 200 MG 12 hr capsule Take 200 mg by mouth 2 (two) times daily.     Yes [provider]  clobetasol (TEMOVATE) 0.05 % GEL Apply 1 application topically 2 (two) times daily as needed (rash).    Yes [provider]  colchicine 0.6 MG tablet Take 1 tablet (0.6 mg total) by mouth 2 (two) times daily. 06/21/17  Yes Croitoru, Mihai, MD  diltiazem (CARDIZEM CD) 360 MG 24 hr capsule Take 1 capsule (360 mg total) by mouth at bedtime. 05/17/17  Yes Adhikari, Tamsen Meek, MD  donepezil (ARICEPT) 10 MG tablet Take 10 mg by mouth at bedtime.    Yes [provider]  folic acid (FOLVITE) 1 MG tablet Take 1 mg by mouth daily.   Yes [provider]  furosemide (LASIX) 40 MG tablet Take 1 tablet (40 mg total) by mouth daily. 06/21/17  Yes Croitoru, Mihai, MD  gabapentin (NEURONTIN) 300 MG capsule Take 300 mg by mouth 2 (two) times daily.   Yes [provider]  HUMALOG 100 UNIT/ML injection INJECT .78 ML (78 UNITS) INTO THE SKIN VIA V-GO PUMP AS DIRECTEDQ Patient taking differently: Inject/release 5-6 "clicks" into the skin three times a day with meals (after testing BGL) 06/15/17  Yes Elayne Snare, MD  Insulin Disposable Pump (V-GO 40) KIT USE ONCE DAILY AS DIRECTED 06/15/17  Yes Elayne Snare, MD  INVOKANA 300 MG TABS tablet TAKE ONE TABLET BY MOUTH DAILY BEFORE BREAKFAST 04/25/17  Yes Elayne Snare, MD  levothyroxine (SYNTHROID, LEVOTHROID) 75 MCG tablet Take 75 mcg by mouth daily before breakfast.    Yes [provider]  liraglutide (VICTOZA) 18 MG/3ML SOPN Inject 0.3 mLs (1.8 mg total) into the skin daily. 02/24/17  Yes Elayne Snare, MD  methotrexate (RHEUMATREX) 2.5 MG tablet Take 7.5 mg by mouth every Monday. For rash   Yes [provider]  metoprolol tartrate (LOPRESSOR) 25 MG tablet Take 1 tablet (25 mg total) by mouth 2 (two) times daily. 05/31/17 08/29/17 Yes Barrett, Evelene Croon, PA-C  OLANZapine (ZYPREXA) 5 MG tablet Take 5 mg by mouth at bedtime.   Yes [provider]  ondansetron (ZOFRAN) 8 MG tablet Take 1 tablet (8 mg  total) by mouth every 8 (eight) hours as needed for nausea. 05/08/12  Yes Dorie Rank, MD  pantoprazole (PROTONIX) 40 MG tablet Take 1 tablet (40 mg total) by mouth daily. 05/18/17  Yes Shelly Coss, MD  pravastatin (PRAVACHOL) 20 MG tablet Take 20 mg by mouth daily.   Yes [provider]  Jonetta Speak LANCETS 49I MISC USE LANCET TO TEST FIVE TIMES A DAY 05/31/17   Elayne Snare, MD   Physical Exam: Blood pressure (!) 128/55, pulse 68, temperature 98 F (36.7 C), resp. rate 16, SpO2 97 %. 1. General:  in No Acute distress   Chronically ill -appearing 2. Psychological: Alert and   Oriented 3. Head/ENT:    Dry Mucous Membranes                          Head Non traumatic, neck supple                           Poor Dentition 4. SKIN: decreased Skin turgor,  Skin clean Dry and intact no rash 5. Heart: Regular rate and rhythm no  Murmur, no Rub or gallop 6. Lungs:  no wheezes or crackles   7. Abdomen: Soft, non-tender, Non distended  obese  bowel sounds present 8. Lower extremities: no clubbing, cyanosis, or edema 9. Neurologically strength 4 out of 5 on right   cranial nerves II through XII slight facial droop on the left, horizontal nystagmus 10. MSK: Normal range of motion   LABS:     Recent Labs  Lab 06/22/17 1341  WBC 9.7  HGB 12.3  HCT 39.8  MCV 80.2  PLT 264*   Basic Metabolic Panel: Recent Labs  Lab 06/22/17 1341  NA 138  K 3.8  CL 104  CO2 22  GLUCOSE 132*  BUN 24*  CREATININE 1.01*  CALCIUM 9.4      No results for input(s): AST, ALT, ALKPHOS, BILITOT, PROT, ALBUMIN in the last 168 hours. No results for input(s): LIPASE, AMYLASE in the last 168 hours. No results for input(s): AMMONIA in the last 168 hours.    HbA1C: No results for input(s): HGBA1C in the last 72 hours. CBG: No results for input(s): GLUCAP in the last 168 hours.    Urine analysis:    Component Value Date/Time   COLORURINE YELLOW 06/22/2017 1737   APPEARANCEUR CLOUDY (A)  06/22/2017 1737   LABSPEC 1.010 06/22/2017 1737   PHURINE 5.0 06/22/2017 1737   GLUCOSEU >=500 (A) 06/22/2017 1737   HGBUR SMALL (A) 06/22/2017 1737   BILIRUBINUR NEGATIVE 06/22/2017 1737   KETONESUR NEGATIVE 06/22/2017 1737   PROTEINUR NEGATIVE 06/22/2017 1737   UROBILINOGEN 0.2 08/31/2014 2118   NITRITE NEGATIVE 06/22/2017 1737   LEUKOCYTESUR LARGE (A) 06/22/2017 1737       Cultures:    Component Value Date/Time   SDES BLOOD RIGHT HAND 05/14/2017 1135   SPECREQUEST  05/14/2017 1135    BOTTLES DRAWN AEROBIC AND  ANAEROBIC Blood Culture adequate volume   CULT  05/14/2017 1135    NO GROWTH 5 DAYS Performed at [facility], [place] [address], [place], [place] [address]    REPTSTATUS 05/19/2017 FINAL 05/14/2017 1135     Radiological Exams on Admission: Dg Chest 2 View  Result Date: 06/22/2017 CLINICAL DATA:  Presyncopal episode. Shortness of breath on exertion. EXAM: CHEST - 2 VIEW COMPARISON:  05/20/2017 FINDINGS: Heart size upper normal with slight decreased cardiomegaly from prior exam. Resolved pulmonary edema with minimal vascular congestion. Indistinct left hemidiaphragm may be minimal pleural fluid or scarring. Overall improved left basilar aeration compared to prior. Suspected scarring in the left mid lung. No new focal airspace disease. No pneumothorax. No acute osseous abnormalities. IMPRESSION: Improved congestive heart failure since last month with decreased cardiomegaly, pleural effusions and pulmonary edema. Mild residual vascular congestion. Indistinct left hemidiaphragm reflect residual small effusion or atelectasis/scarring. Electronically Signed   By: Jeb Levering M.D.   On: 06/22/2017 18:20    Chart has been reviewed    Assessment/Plan   77 y.o. female with medical history significant of    DM2 on insulin Injector, hx OSA non on CPAP  Admitted for syncope event  Present on Admission: . Syncope - in the setting of hypotension, bradycardia but also  abnormal neurological exam - given risk factor will admit rehydrate obtain CE, ECHO, monitor on tele and obtain carotid dopplers. If abnormal echogram will need cardiology follow-up  Right side weakness mild will obtain MRI given nystagmus and  Hx of vertigo If Evidence of CVA on MRI would benefit from neurology consult . Essential hypertension, benign - hold metoprolol/diltaizem given hypotension and bradycardia with presyncope . Hyperlipemia -stable cont. statin . OSA (obstructive sleep apnea) now compliant with CPAP will order . Acute lower UTI - treat with rocephin await results of urine culture.  Patient reports suprapubic pain.  Marland Kitchen AKI (acute kidney injury) ([place]) - rehydrate and hold lasix  dm2 -  - Order Sensitive SSI   diabetes coordinator consult  -  check TSH and HgA1C  - Hold by mouth medications     Other plan as per orders.  DVT prophylaxis:  SCD    Code Status:    DNR/DNI  as per patient  I had personally discussed CODE STATUS with patient   Family Communication:   Family not  at  Bedside    Disposition Plan:      To home once workup is complete and patient is stable   Would benefit from PT/OT eval prior to DC ordered                        Diabetes coordinator    consulted                          Consults called: none  Admission status:    obs   Level of care    tele              Toy Baker 06/23/2017, 1:13 AM    [facility]  Pager [phone]   after 2 AM please page floor coverage PA If 7AM-7PM, please contact the day team taking care of the patient  [email or web address]  Password [email or web address]

## 2017-06-22 NOTE — Patient Instructions (Addendum)
Chest x-ray today.  Prescription will be sent to DME for new CPAP 10 cm with nasal pillows and other CPAP supplies  Refill on nexium instead of  protonix

## 2017-06-22 NOTE — ED Triage Notes (Addendum)
Patient transported from PCP office after syncopal episode. Notes describing event at office are available in Epic from PCP. Patient alert and oriented at this time. 22g saline lock in left hand from EMS. Grip strength equal, no slurred speech, no facial droop, no vision changes. Patient complains of dizziness at this time.

## 2017-06-22 NOTE — Assessment & Plan Note (Signed)
Towards the end of the visit, she is only got cold and clammy, diaphoretic and lost consciousness briefly with blinking movements of her eyes and was unresponsive for a few seconds, systolic blood pressure could not be obtained, CBG was 136 -Laid down on the table, consciousness improved within few seconds and she was following commands Of note, she is on an insulin pump  Differential diagnosis includes syncope versus seizure. EMS was called and she will  be transported to the emergency room

## 2017-06-22 NOTE — Addendum Note (Signed)
Addended by: Cyril MourningALVA, RAKESH V on: 06/22/2017 12:58 PM   Modules accepted: Level of Service

## 2017-06-22 NOTE — Assessment & Plan Note (Signed)
Prescription will be sent to DME for new CPAP 10 cm with nasal pillows and other CPAP supplies  Weight loss encouraged, compliance with goal of at least 4-6 hrs every night is the expectation. Advised against medications with sedative side effects Cautioned against driving when sleepy - understanding that sleepiness will vary on a day to day basis

## 2017-06-23 ENCOUNTER — Observation Stay (HOSPITAL_BASED_OUTPATIENT_CLINIC_OR_DEPARTMENT_OTHER): Payer: Medicare Other

## 2017-06-23 ENCOUNTER — Observation Stay (HOSPITAL_COMMUNITY): Payer: Medicare Other

## 2017-06-23 ENCOUNTER — Encounter (HOSPITAL_COMMUNITY): Payer: Self-pay | Admitting: General Practice

## 2017-06-23 DIAGNOSIS — Z794 Long term (current) use of insulin: Secondary | ICD-10-CM

## 2017-06-23 DIAGNOSIS — G4733 Obstructive sleep apnea (adult) (pediatric): Secondary | ICD-10-CM | POA: Diagnosis not present

## 2017-06-23 DIAGNOSIS — I361 Nonrheumatic tricuspid (valve) insufficiency: Secondary | ICD-10-CM

## 2017-06-23 DIAGNOSIS — R55 Syncope and collapse: Secondary | ICD-10-CM

## 2017-06-23 DIAGNOSIS — E7800 Pure hypercholesterolemia, unspecified: Secondary | ICD-10-CM | POA: Diagnosis not present

## 2017-06-23 DIAGNOSIS — I10 Essential (primary) hypertension: Secondary | ICD-10-CM | POA: Diagnosis not present

## 2017-06-23 DIAGNOSIS — G459 Transient cerebral ischemic attack, unspecified: Secondary | ICD-10-CM

## 2017-06-23 DIAGNOSIS — E119 Type 2 diabetes mellitus without complications: Secondary | ICD-10-CM | POA: Diagnosis not present

## 2017-06-23 LAB — CBC
HEMATOCRIT: 37.6 % (ref 36.0–46.0)
HEMOGLOBIN: 11.2 g/dL — AB (ref 12.0–15.0)
MCH: 24.5 pg — AB (ref 26.0–34.0)
MCHC: 29.8 g/dL — ABNORMAL LOW (ref 30.0–36.0)
MCV: 82.1 fL (ref 78.0–100.0)
Platelets: 445 10*3/uL — ABNORMAL HIGH (ref 150–400)
RBC: 4.58 MIL/uL (ref 3.87–5.11)
RDW: 17.2 % — ABNORMAL HIGH (ref 11.5–15.5)
WBC: 9.3 10*3/uL (ref 4.0–10.5)

## 2017-06-23 LAB — COMPREHENSIVE METABOLIC PANEL
ALT: 16 U/L (ref 14–54)
ANION GAP: 10 (ref 5–15)
AST: 17 U/L (ref 15–41)
Albumin: 3.3 g/dL — ABNORMAL LOW (ref 3.5–5.0)
Alkaline Phosphatase: 96 U/L (ref 38–126)
BUN: 13 mg/dL (ref 6–20)
CHLORIDE: 107 mmol/L (ref 101–111)
CO2: 24 mmol/L (ref 22–32)
CREATININE: 0.97 mg/dL (ref 0.44–1.00)
Calcium: 8.8 mg/dL — ABNORMAL LOW (ref 8.9–10.3)
GFR calc non Af Amer: 55 mL/min — ABNORMAL LOW (ref >60)
Glucose, Bld: 213 mg/dL — ABNORMAL HIGH (ref 65–99)
POTASSIUM: 4.1 mmol/L (ref 3.5–5.1)
SODIUM: 141 mmol/L (ref 135–145)
Total Bilirubin: 0.4 mg/dL (ref 0.3–1.2)
Total Protein: 6.3 g/dL — ABNORMAL LOW (ref 6.5–8.1)

## 2017-06-23 LAB — TROPONIN I

## 2017-06-23 LAB — ECHOCARDIOGRAM COMPLETE
Height: 63 in
Weight: 3446.4 oz

## 2017-06-23 LAB — PROTIME-INR
INR: 1.1
PROTHROMBIN TIME: 14.1 s (ref 11.4–15.2)

## 2017-06-23 LAB — HEMOGLOBIN A1C
Hgb A1c MFr Bld: 6.2 % — ABNORMAL HIGH (ref 4.8–5.6)
MEAN PLASMA GLUCOSE: 131.24 mg/dL

## 2017-06-23 LAB — TSH: TSH: 1.495 u[IU]/mL (ref 0.350–4.500)

## 2017-06-23 LAB — GLUCOSE, CAPILLARY: GLUCOSE-CAPILLARY: 104 mg/dL — AB (ref 65–99)

## 2017-06-23 LAB — APTT: APTT: 32 s (ref 24–36)

## 2017-06-23 MED ORDER — INSULIN PUMP
Freq: Three times a day (TID) | SUBCUTANEOUS | Status: DC
  Filled 2017-06-23: qty 1

## 2017-06-23 MED ORDER — PRAVASTATIN SODIUM 40 MG PO TABS: 40.0000 mg | ORAL_TABLET | Freq: Every day | ORAL | 0 refills | Status: DC

## 2017-06-23 NOTE — Care Management Note (Signed)
Case Management Note  Patient Details  Name: Clayborn BignessShirley H Petitjean MRN: [id number] Date of Birth: 06/15/1940  Subjective/Objective:  Pt presented for Syncope. PTA from home with the support of son. PT was previously active with [facility]. [facility]. No resumption orders needed since under Observation Status.                   Action/Plan: [facility] is aware that pt is hospitalized and is following care needs. No further needs from CM at this time.   Expected Discharge Date:                  Expected Discharge Plan:  Home w Home Health Services  In-House Referral:  NA  Discharge planning Services  CM Consult  Post Acute Care Choice:  Home Health, Resumption of Svcs/PTA Provider Choice offered to:  Patient  DME Arranged:  N/A DME Agency:  NA  HH Arranged:  RN, PT, OT HH Agency:  [facility]  Status of Service:  Completed, signed off  If discussed at [employer] Meetings, dates discussed:    Additional Comments:  Gala LewandowskyGraves-Bigelow, Rafia Shedden Kaye, RN 06/23/2017, 11:31 AM

## 2017-06-23 NOTE — Discharge Summary (Signed)
Physician Discharge Summary  [name]:[id number] DOB: [birth year] DOA: 06/22/2017  PCP: Merrilee Seashore, MD  Admit date: 06/22/2017 Discharge date: 06/23/2017  Admitted From: Home  Disposition:  Home with home health   Recommendations for Outpatient Follow-up:  1. Follow up with PCP in 2-3 weeks 2. Please obtain lipid panel in 4 weeks  Home Health: Continued  Equipment/Devices: As previous  Discharge Condition: Good  CODE STATUS: DO NOT RESUSCITATE Diet recommendation: Diabetic, Cardiac  Brief/Interim Summary: [name] is a 77 y.o. F with IDM, OSA on CPAP, morbid obesity, HTN, hx ischemic colitis, hx recurrent TIA who presented with presyncopal episode and transient right leg numbness.      Pre-syncope Prodrome and hypotension/bradycardia noted in field suggested initially a vagal episode.  However, as below, transient leg numbness also suggests TIA.  Given fluids, patient returned completely to normal.     Right leg numbness -> likely TIA Patient did not disclose this to ER provider, but related it to admitting provider and me.  Her right leg numbness began after presyncope, lasted only a few hours, resolved overnight in the ER spontaneously, was associated with no other neurologic deficits.   MRI brain without stroke.  MRA head showed no significant correlating left sided stenoses.  Echo normal.  Carotids of the neck unremarkable.    Event presumed to be a TIA.  HgbA1c indicates excellent control.  Lipids from March 2019 showed LDL near goal, pravastatin titrated up.  Patient referred for outpatient cardiac monitoring.  Diabetes HgbA1c excellent.   HTN BP well controlled.  OSA CPAP continued.  Asymptomatic bacteriuria Given one dose ceftriaxone.  No symptoms.  Can monitor as outpatient for symptoms.  Discussed with patient.        Discharge Diagnoses:  Active Problems:   Type 2 diabetes mellitus without complications (HCC)   Essential  hypertension, benign   Hyperlipemia   OSA (obstructive sleep apnea)   Acute lower UTI   AKI (acute kidney injury) ([place])   Syncope    Discharge Instructions  Discharge Instructions    Diet - low sodium heart healthy   Complete by:  As directed    Discharge instructions   Complete by:  As directed    From Dr. Loleta Books: You were admitted with a near-fainting spell and transient right leg numbness.   This appears to have been a mini-stroke, or TIA.   Your MRI of the brain showed no stroke or other concerning findings.  Your MR angiogram of the head, showed some small fatty plaques in the blood vessels of the head, but nothing that corresponded to your symptoms, and nothing that needs treatment.   The preliminary read of the ultrasound of your neck was normal. I will set you up for a 30-day event monitor (cardiac monitor for your heart) with the cardiologists.  They will call you early next week.  In the meantime, resume your home diabetes medicines.   Resume your home blood pressure medicines.  Continue your daily aspirin. INCREASE your pravastatin (cholesterol medicine) to 40 mg nightly  Follow up with Dr. Ashby Dawes in the next 2-3 weeks, try to get a cholesterol test in 1-2 months.   Increase activity slowly   Complete by:  As directed      Allergies as of 06/23/2017      Reactions   Flagyl [metronidazole Hcl] Itching, Rash   Ciprofloxacin Itching, Rash   Metformin And Related Rash   Milk-related Compounds Other (See Comments)   Stomach  pains   Other Other (See Comments)   [place] nuts cause severe facial redness      Medication List    TAKE these medications   aspirin 81 MG EC tablet Take 1 tablet (81 mg total) by mouth daily.   betamethasone dipropionate 0.05 % cream Commonly known as:  DIPROLENE Apply 1 application topically 2 (two) times daily as needed (rash).   carbamazepine 200 MG 12 hr capsule Commonly known as:  CARBATROL Take 200 mg by mouth 2 (two)  times daily.   clobetasol 0.05 % Gel Commonly known as:  TEMOVATE Apply 1 application topically 2 (two) times daily as needed (rash).   colchicine 0.6 MG tablet Take 1 tablet (0.6 mg total) by mouth 2 (two) times daily.   diltiazem 360 MG 24 hr capsule Commonly known as:  CARDIZEM CD Take 1 capsule (360 mg total) by mouth at bedtime.   donepezil 10 MG tablet Commonly known as:  ARICEPT Take 10 mg by mouth at bedtime.   folic acid 1 MG tablet Commonly known as:  FOLVITE Take 1 mg by mouth daily.   furosemide 40 MG tablet Commonly known as:  LASIX Take 1 tablet (40 mg total) by mouth daily.   gabapentin 300 MG capsule Commonly known as:  NEURONTIN Take 300 mg by mouth 2 (two) times daily.   HUMALOG 100 UNIT/ML injection Generic drug:  insulin lispro INJECT .78 ML (78 UNITS) INTO THE SKIN VIA V-GO PUMP AS DIRECTEDQ What changed:  See the new instructions.   INVOKANA 300 MG Tabs tablet Generic drug:  canagliflozin TAKE ONE TABLET BY MOUTH DAILY BEFORE BREAKFAST   levothyroxine 75 MCG tablet Commonly known as:  SYNTHROID, LEVOTHROID Take 75 mcg by mouth daily before breakfast.   liraglutide 18 MG/3ML Sopn Commonly known as:  VICTOZA Inject 0.3 mLs (1.8 mg total) into the skin daily.   methotrexate 2.5 MG tablet Commonly known as:  RHEUMATREX Take 7.5 mg by mouth every Monday. For rash   metoprolol tartrate 25 MG tablet Commonly known as:  LOPRESSOR Take 1 tablet (25 mg total) by mouth 2 (two) times daily.   OLANZapine 5 MG tablet Commonly known as:  ZYPREXA Take 5 mg by mouth at bedtime.   ondansetron 8 MG tablet Commonly known as:  ZOFRAN Take 1 tablet (8 mg total) by mouth every 8 (eight) hours as needed for nausea.   ONETOUCH DELICA LANCETS 32X Misc USE LANCET TO TEST FIVE TIMES A DAY   pantoprazole 40 MG tablet Commonly known as:  PROTONIX Take 1 tablet (40 mg total) by mouth daily.   pravastatin 40 MG tablet Commonly known as:  PRAVACHOL Take 1  tablet (40 mg total) by mouth daily. What changed:    medication strength  how much to take   V-GO 40 Kit USE ONCE DAILY AS DIRECTED      Follow-up Information    Home, [facility] Follow up.   Specialty:  Home Health Services Why:  Registered Nurse, Physical Therapy, Occuaptional Therapy Contact information: [place] [phone]          Allergies  Allergen Reactions  . Flagyl [Metronidazole Hcl] Itching and Rash  . Ciprofloxacin Itching and Rash  . Metformin And Related Rash  . Milk-Related Compounds Other (See Comments)    Stomach pains  . Other Other (See Comments)    [place] nuts cause severe facial redness    Consultations:  Neurology, discussed informally MRI results  and work up by phone   Procedures/Studies: Dg Chest 2 View  Result Date: 06/22/2017 CLINICAL DATA:  Presyncopal episode. Shortness of breath on exertion. EXAM: CHEST - 2 VIEW COMPARISON:  05/20/2017 FINDINGS: Heart size upper normal with slight decreased cardiomegaly from prior exam. Resolved pulmonary edema with minimal vascular congestion. Indistinct left hemidiaphragm may be minimal pleural fluid or scarring. Overall improved left basilar aeration compared to prior. Suspected scarring in the left mid lung. No new focal airspace disease. No pneumothorax. No acute osseous abnormalities. IMPRESSION: Improved congestive heart failure since last month with decreased cardiomegaly, pleural effusions and pulmonary edema. Mild residual vascular congestion. Indistinct left hemidiaphragm reflect residual small effusion or atelectasis/scarring. Electronically Signed   By: Jeb Levering M.D.   On: 06/22/2017 18:20   Mr Jodene Nam Head Wo Contrast  Result Date: 06/23/2017 CLINICAL DATA:  Follow up stroke. Presyncope, loss of consciousness. History of hypertension, hyperlipidemia, diabetes and stroke. EXAM: MRA HEAD WITHOUT CONTRAST TECHNIQUE: Angiographic images of the [place] were obtained using MRA technique without intravenous contrast. COMPARISON:  MRI of the head June 23, 2017.  MRA head July 16, 2010. FINDINGS: ANTERIOR CIRCULATION: Normal flow related enhancement of the included cervical, petrous, cavernous and supraclinoid internal carotid arteries. Patent anterior communicating artery. Moderate stenosis right M1 segment. Patent anterior and middle cerebral arteries, mild luminal irregularity compatible with atherosclerosis. No large vessel occlusion, flow limiting stenosis, aneurysm. POSTERIOR CIRCULATION: Right vertebral artery is dominant. Basilar artery is patent, with normal flow related enhancement of the main branch vessels. Patent posterior cerebral arteries, luminal irregularity compatible with atherosclerosis. Moderate stenosis bilateral P3 segments. No large vessel occlusion, flow limiting stenosis,  aneurysm. ANATOMIC VARIANTS: None. Source images and MIP images were reviewed. IMPRESSION: 1. No emergent large vessel occlusion. 2. Moderate stenosis right M1, bilateral P3 segments. 3. Mild generalized intracranial atherosclerosis. Electronically Signed   By: Elon Alas M.D.   On: 06/23/2017 14:38   Mr Brain Wo Contrast  Result Date: 06/23/2017 CLINICAL DATA:  Altered mental status EXAM: MRI HEAD WITHOUT CONTRAST TECHNIQUE: Multiplanar, multiecho pulse sequences of the brain and surrounding structures were obtained without intravenous contrast. COMPARISON:  Brain MRI 09/03/2014 FINDINGS: BRAIN: The midline structures are normal. There is no acute infarct or acute hemorrhage. There is no mass lesion or other mass effect. There is no hydrocephalus, dural abnormality or extra-axial collection. Multifocal white matter hyperintensity, most commonly due to chronic ischemic microangiopathy. No age-advanced or lobar predominant atrophy. No chronic microhemorrhage or superficial siderosis. VASCULAR: Major intracranial arterial and venous sinus flow voids are  preserved. SKULL AND UPPER CERVICAL SPINE: The visualized skull base, calvarium, upper cervical spine and extracranial soft tissues are normal. SINUSES/ORBITS: No fluid levels or advanced mucosal thickening. No mastoid or middle ear effusion. The orbits are normal. IMPRESSION: Mild chronic small vessel disease, unchanged.  No acute abnormality. Electronically Signed   By: Ulyses Jarred M.D.   On: 06/23/2017 01:40   Echocardiogram Study Conclusions  - Left ventricle: The cavity size was normal. Wall thickness was   increased in a pattern of moderate LVH. Systolic function was   normal. The estimated ejection fraction was in the range of 60%   to 65%. Wall motion was normal; there were no regional wall   motion abnormalities. Left ventricular diastolic function   parameters were normal. - Aortic valve: Valve area (Vmax): 2.33 cm^2. - Left atrium: The atrium was mildly dilated. - Atrial septum: No defect or patent foramen ovale was identified.  US carotids Final Interpretation: Right Carotid: Velocities in the right ICA are consistent with a 1-39% stenosis.  Left Carotid: Velocities in the left ICA are consistent with a 1-39% stenosis.  Vertebrals: Bilateral vertebral arteries demonstrate antegrade flow.    Subjective: Feels normal  No focal weakness, numbness, confusion, dizzieness, chest pain, dyspnea.  Discharge Exam: Vitals:   06/23/17 0845 06/23/17 1635  BP:  (!) 174/77  Pulse: (!) 117 94  Resp:    Temp:  98.4 F (36.9 C)  SpO2:  96%   Vitals:   06/23/17 0500 06/23/17 0713 06/23/17 0845 06/23/17 1635  BP:  124/65  (!) 174/77  Pulse:  84 (!) 117 94  Resp:      Temp:  (!) 97.5 F (36.4 C)  98.4 F (36.9 C)  TempSrc:  Oral  Oral  SpO2:  93%  96%  Weight: 97.7 kg (215 lb 6.4 oz)     Height:        General: Pt is alert, awake, not in acute distress Cardiovascular: RRR, S1/S2 +, no rubs, no gallops Respiratory: CTA bilaterally, no wheezing, no  rhonchi Abdominal: Soft, NT, ND, bowel sounds + Extremities: no edema, no cyanosis    The results of significant diagnostics from this hospitalization (including imaging, microbiology, ancillary and laboratory) are listed below for reference.     Microbiology: No results found for this or any previous visit (from the past 240 hour(s)).   Labs: BNP (last 3 results) Recent Labs    05/12/17 0816  BNP 101.7*   Basic Metabolic Panel: Recent Labs  Lab 06/22/17 1341 06/23/17 0902  NA 138 141  K 3.8 4.1  CL 104 107  CO2 22 24  GLUCOSE 132* 213*  BUN 24* 13  CREATININE 1.01* 0.97  CALCIUM 9.4 8.8*   Liver Function Tests: Recent Labs  Lab 06/23/17 0902  AST 17  ALT 16  ALKPHOS 96  BILITOT 0.4  PROT 6.3*  ALBUMIN 3.3*   No results for input(s): LIPASE, AMYLASE in the last 168 hours. No results for input(s): AMMONIA in the last 168 hours. CBC: Recent Labs  Lab 06/22/17 1341 06/23/17 0902  WBC 9.7 9.3  HGB 12.3 11.2*  HCT 39.8 37.6  MCV 80.2 82.1  PLT 486* 445*   Cardiac Enzymes: Recent Labs  Lab 06/22/17 2146 06/23/17 0240 06/23/17 0902  TROPONINI <0.03 <0.03 <0.03   BNP: Invalid input(s): POCBNP CBG: Recent Labs  Lab 06/22/17 2329 06/23/17 0451  GLUCAP 106* 104*   D-Dimer Recent Labs    06/22/17 2146  DDIMER 0.62*   Hgb A1c Recent Labs    06/23/17 0240  HGBA1C 6.2*   Lipid Profile No results for input(s): CHOL, HDL, LDLCALC, TRIG, CHOLHDL, LDLDIRECT in the last 72 hours. Thyroid function studies Recent Labs    06/23/17 0240  TSH 1.495   Anemia work up No results for input(s): VITAMINB12, FOLATE, FERRITIN, TIBC, IRON, RETICCTPCT in the last 72 hours. Urinalysis    Component Value Date/Time   COLORURINE YELLOW 06/22/2017 1737   APPEARANCEUR CLOUDY (A) 06/22/2017 1737   LABSPEC 1.010 06/22/2017 1737   PHURINE 5.0 06/22/2017 1737   GLUCOSEU >=500 (A) 06/22/2017 1737   HGBUR SMALL (A) 06/22/2017 1737   BILIRUBINUR NEGATIVE  06/22/2017 1737   KETONESUR NEGATIVE 06/22/2017 1737   PROTEINUR NEGATIVE 06/22/2017 1737   UROBILINOGEN 0.2 08/31/2014 2118   NITRITE NEGATIVE 06/22/2017 1737   LEUKOCYTESUR LARGE (A) 06/22/2017 1737   Sepsis Labs Invalid input(s): PROCALCITONIN,  WBC,  [place] [facility] No results found for this or any previous visit (from the past 240 hour(s)).   Time coordinating discharge: 45 minutes       SIGNED:   Edwin Dada, MD  [facility] 06/23/2017, 8:43 PM

## 2017-06-23 NOTE — Progress Notes (Addendum)
Inpatient Diabetes Program Recommendations  AACE/ADA: New Consensus Statement on Inpatient Glycemic Control (2015)  Target Ranges:  Prepandial:   less than 140 mg/dL      Peak postprandial:   less than 180 mg/dL (1-2 hours)      Critically ill patients:  140 - 180 mg/dL   Lab Results  Component Value Date   GLUCAP 104 (H) 06/23/2017   HGBA1C 6.2 (H) 06/23/2017   Review of Glycemic Control  Diabetes history: DM 2 Outpatient Diabetes medications:  V-go pump 40 unit basal, boluses at meal times: 10-27-12 units,Invokana 300 mg, VICTOZA 1.8 mg Current orders for Inpatient glycemic control: Novolog Correction scale 0-9 units Q4 hours  Inpatient Diabetes Program Recommendations:    Spoke with patient over the phone. Patient uses a V-Go pump at home and says she would like to use it while  Here. V-Go cartridge is changed every 24 hours. Patient is not ordered her Victoza or Invokana while here, which will decrease risk of hypoglycemia while on the V-Go. Consider ordering insulin pump order set and d/c correction scale.  A1c 6.2% very well controlled at home.  Spoke with Dr. Maryfrances Bunnellanford and discussed plan of care. Spoke with Tammy SoursGreg, RN and updated him on plan of care. Called patient to inform her that she can use her pump and told her to notify her nurse before she places it.  Thanks,  Christena DeemShannon Lucillia Corson RN, MSN, BC-ADM, [facility] Inpatient Diabetes Coordinator Team Pager [phone] (8a-5p)

## 2017-06-23 NOTE — Care Management Obs Status (Signed)
MEDICARE OBSERVATION STATUS NOTIFICATION   Patient Details  Name: [name] MRN: [name] Date of Birth: 1940/09/19   Medicare Observation Status Notification Given:  Yes    Gala LewandowskyGraves-Bigelow, Annissa Andreoni Kaye, RN 06/23/2017, 11:27 AM

## 2017-06-23 NOTE — Evaluation (Signed)
Occupational Therapy Evaluation and Discharge Patient Details Name: [name] MRN: [id number] DOB: 1940/09/04 Today's Date: 06/23/2017    History of Present Illness [name] is a 77yo white female who comes [facility] after presyncope at pulmonogist visit, notes vertigo, SOB, found to be hypotensive with bradycardia. PMH: IDDM2 with pump, OSA off CPAP, depression, GERD, HTN, ishcaemic collitis, CVA. Pt was nearing DC from HHPT/OT services d/t great progress at home since prior admission,   Clinical Impression   Pt is functioning at her baseline. Plans to go home and complete HHOT with intermittent assist of family and friends. No further acute OT needs.   Follow Up Recommendations  Home health OT;Supervision - Intermittent    Equipment Recommendations       Recommendations for Other Services       Precautions / Restrictions Precautions Precautions: Fall      Mobility Bed Mobility               General bed mobility comments: received up in chair  Transfers Overall transfer level: Modified independent Equipment used: None   Sit to Stand: Modified independent (Device/Increase time)              Balance Overall balance assessment: Independent   Sitting balance-Leahy Scale: Good       Standing balance-Leahy Scale: Good                             ADL either performed or assessed with clinical judgement   ADL Overall ADL's : At baseline                                       General ADL Comments: recommended medical alert device     Vision Patient Visual Report: No change from baseline       Perception     Praxis      Pertinent Vitals/Pain Pain Assessment: No/denies pain     Hand Dominance Left   Extremity/Trunk Assessment Upper Extremity Assessment Upper Extremity Assessment: Overall WFL for tasks assessed   Lower Extremity Assessment Lower Extremity Assessment: Overall WFL for tasks assessed   Cervical / Trunk Assessment Cervical / Trunk Assessment: Normal   Communication Communication Communication: No difficulties   Cognition Arousal/Alertness: Awake/alert Behavior During Therapy: WFL for tasks assessed/performed Overall Cognitive Status: Within Functional Limits for tasks assessed                                     General Comments       Exercises     Shoulder Instructions      Home Living Family/patient expects to be discharged to:: Private residence Living Arrangements: Alone Available Help at Discharge: Neighbor;Family;Friend(s);Available PRN/intermittently Type of Home: House Home Access: Level entry           Bathroom Shower/Tub: Tub/shower unit         Home Equipment: Krystal Clark - 2 wheels;Wheelchair - manual;Shower seat          Prior Functioning/Environment Level of Independence: Needs assistance  Gait / Transfers Assistance Needed: limited community AMB, drives, has groceries delivered, no AD, no falls since prior admission, no falls in pat 6 months  ADL's / Homemaking Assistance Needed: independent in ADL, [facility]            OT Problem List:        OT Treatment/Interventions:      OT Goals(Current goals can be found in the care plan section) Acute Rehab OT Goals Patient Stated Goal: to go home  OT Frequency:     Barriers to D/C:            Co-evaluation              AM-PAC PT "6 Clicks" Daily Activity     Outcome Measure Help from another person eating meals?: None Help from another person taking care of personal grooming?: None Help from another person toileting, which includes using toliet, bedpan, or urinal?: None Help from another person bathing (including washing, rinsing, drying)?: None Help from another person to put on and taking off regular upper body clothing?: None Help from another person to put on and taking off regular lower body clothing?: None 6  Click Score: 24   End of Session    Activity Tolerance: Patient tolerated treatment well Patient left:    OT Visit Diagnosis: Other abnormalities of gait and mobility (R26.89)                Time: 4098-11911201-1216 OT Time Calculation (min): 15 min Charges:  OT General Charges $OT Visit: 1 Visit OT Evaluation $OT Eval Low Complexity: 1 Low G-Codes:     06/23/2017 Martie RoundJulie Samad Thon, OTR/L Pager: [phone]  Iran PlanasMayberry, [place] BailiffJulie Lynn 06/23/2017, 1:17 PM

## 2017-06-23 NOTE — Plan of Care (Signed)
Orthostatic BP Q shift.

## 2017-06-23 NOTE — Progress Notes (Signed)
Preliminary results by tech - Carotid duplex completed. No evidence of a significant stenosis in bilateral carotid arteries. Vertebral are patent with antegrade flow.  [name], BS, RDMS, RVT

## 2017-06-23 NOTE — Progress Notes (Signed)
Echocardiogram 2D Echocardiogram has been performed.  Pieter PartridgeBrooke S Minsa Weddington 06/23/2017, 9:44 AM

## 2017-06-23 NOTE — Evaluation (Signed)
Physical Therapy Evaluation Patient Details Name: [name] MRN: [id number] DOB: 1940-10-12 Today's Date: 06/23/2017   History of Present Illness  [name] is a 77yo white female who comes to [facility] on 6/5 after presyncope at pulmonogist visit, notes vertigo, SOB, giddiness, foudn to be hypotensive with bradycardia. PMH: IDDM2 with pump, OSA off CPAP, depression, GERD, HTN, ishcaemic collitis, CVA. Pt was nearing DC from HHPT/OT services d/t great progress at home since prior admission,  Clinical Impression  Pt admitted with above diagnosis. Pt currently with functional limitations due to the deficits listed below (see "PT Problem List"). Upon entry, pt in bed, son present. The pt is awake and agreeable to participate. The pt is alert and oriented x3, pleasant, conversational, and following simple commands consistently. Pt reports she is feeling much better, and has returned to her baseline. Functional mobility assessment demonstrates increased time requirements, moderate fatigue limitations, however pt reports this to be her baseline functional status. Patient is at baseline, all education completed, and time is given to address all questions/concerns. No additional skilled PT services needed at this time, PT signing off. PT recommends daily ambulation ad lib or with nursing staff as needed to prevent deconditioning.      Follow Up Recommendations No PT follow up    Equipment Recommendations  None recommended by PT    Recommendations for Other Services       Precautions / Restrictions Precautions Precautions: Fall Restrictions Weight Bearing Restrictions: No      Mobility  Bed Mobility               General bed mobility comments: received up in chair  Transfers Overall transfer level: Modified independent Equipment used: None   Sit to Stand: Modified independent (Device/Increase time)            Ambulation/Gait Ambulation/Gait assistance: Min  guard;Supervision Ambulation Distance (Feet): 300 Feet Assistive device: None Gait Pattern/deviations: Decreased weight shift to right;Decreased weight shift to left Gait velocity: 0.27m/s      Stairs            Wheelchair Mobility    Modified Rankin (Stroke Patients Only)       Balance Overall balance assessment: Independent   Sitting balance-Leahy Scale: Good       Standing balance-Leahy Scale: Good                               Pertinent Vitals/Pain Pain Assessment: No/denies pain    Home Living Family/patient expects to be discharged to:: Private residence Living Arrangements: Alone Available Help at Discharge: Friend(s);Available PRN/intermittently(combined family, neigbors, and friends ) Type of Home: House Home Access: Level entry     Home Layout: Other (Comment) Home Equipment: Krystal Clark - 2 wheels;Wheelchair - Facilities manager / Transfers Assistance Needed: limited community AMB, drives, has groceries delivered, no AD, no falls since prior admission, no falls in pat 6 months   ADL's / [employer] Assistance Needed: independent in ADL, about to be DC from HHPT        Hand Dominance   Dominant Hand: Left    Extremity/Trunk Assessment             Cervical / Trunk Assessment Cervical / Trunk Assessment: Normal  Communication   Communication: No difficulties  Cognition Arousal/Alertness: Awake/alert Behavior During Therapy: WFL for tasks assessed/performed Overall Cognitive Status: Within  Functional Limits for tasks assessed                                        General Comments      Exercises     Assessment/Plan    PT Assessment Patent does not need any further PT services  PT Problem List         PT Treatment Interventions      PT Goals (Current goals can be found in the Care Plan section)  Acute Rehab PT Goals Patient Stated Goal: continue to  progress mobility at DC  PT Goal Formulation: All assessment and education complete, DC therapy    Frequency     Barriers to discharge        Co-evaluation               AM-PAC PT "6 Clicks" Daily Activity  Outcome Measure Difficulty turning over in bed (including adjusting bedclothes, sheets and blankets)?: A Little Difficulty moving from lying on back to sitting on the side of the bed? : A Little Difficulty sitting down on and standing up from a chair with arms (e.g., wheelchair, bedside commode, etc,.)?: A Little Help needed moving to and from a bed to chair (including a wheelchair)?: A Little Help needed walking in hospital room?: None Help needed climbing 3-5 steps with a railing? : A Little 6 Click Score: 19    End of Session   Activity Tolerance: Patient tolerated treatment well Patient left: in chair;with call bell/phone within reach;with family/visitor present;with chair alarm set Nurse Communication: Mobility status PT Visit Diagnosis: Other abnormalities of gait and mobility (R26.89);Difficulty in walking, not elsewhere classified (R26.2)    Time: [id number] PT Time Calculation (min) (ACUTE ONLY): 14 min   Charges:   PT Evaluation $PT Eval Moderate Complexity: 1 Mod PT Treatments $Therapeutic Activity: 8-22 mins   PT G Codes:        11:20 AM, 06/23/17 Rosamaria LintsAllan C Buccola, PT, DPT Physical Therapist - [place] [phone] (Pager)  [phone] (Office)     Buccola,Allan C 06/23/2017, 11:18 AM

## 2017-06-24 ENCOUNTER — Telehealth: Payer: Self-pay

## 2017-06-24 ENCOUNTER — Other Ambulatory Visit: Payer: Self-pay | Admitting: Physician Assistant

## 2017-06-24 ENCOUNTER — Telehealth: Payer: Self-pay | Admitting: Pulmonary Disease

## 2017-06-24 DIAGNOSIS — R55 Syncope and collapse: Secondary | ICD-10-CM

## 2017-06-24 LAB — ACID FAST CULTURE WITH REFLEXED SENSITIVITIES (MYCOBACTERIA): Acid Fast Culture: NEGATIVE

## 2017-06-24 LAB — URINE CULTURE

## 2017-06-24 MED ORDER — ESOMEPRAZOLE MAGNESIUM 40 MG PO PACK: 40.0000 mg | PACK | Freq: Every day | ORAL | 2 refills | Status: DC

## 2017-06-24 NOTE — Telephone Encounter (Signed)
Per RA- nexium 40mg .  Rx has been sent to preferred pharmacy. Nothing further is needed.

## 2017-06-24 NOTE — Telephone Encounter (Signed)
Per 06/22/17 OV- Patient DME company to change from [employer] order was placed. DME order for  [employer].

## 2017-06-24 NOTE — Telephone Encounter (Signed)
Pt is returning call. Cb is [phone].

## 2017-06-24 NOTE — Telephone Encounter (Signed)
Patient requesting Nexium prescription.  Nexium is not on current med list.  She also would like RA to look at past hospital note 06[facility]22/[facility].    RA please advise

## 2017-06-24 NOTE — Telephone Encounter (Signed)
Okay to provide new prescription of Nexium  once daily for 1 month with 2 refills Please let her know I have reviewed hospital discharge diagnosis of TIA and I am glad she is doing better

## 2017-06-24 NOTE — Telephone Encounter (Signed)
-----   Message from Darrol Jumphonda G Barrett, PA-C sent at 06/24/2017  7:09 AM EDT ----- Patient just discharged after syncope/near syncope.  I requested an event monitor.  Dr. Erin Hearingroitoru's patient, she has an appointment with him in August.  These for this to have her can arrange the event monitor and call her.  Thank you

## 2017-06-24 NOTE — Telephone Encounter (Signed)
Nexium doesn't come in 60mg . It comes in 20mg  and 40mg  capsules.

## 2017-06-24 NOTE — Telephone Encounter (Addendum)
RA please clarify Nexium dosage. Thanks [facility] [address]

## 2017-06-24 NOTE — Telephone Encounter (Signed)
Spoke with patient, states she was informed at discharge that someone would call her to set up monitor appt. I told her if she does not receive a call by Wednesday of next week to contact the office and make the appointment. Patient voiced understanding.   Message sent to schedulers as a reminder per her request.

## 2017-06-24 NOTE — Telephone Encounter (Signed)
60mg

## 2017-06-27 ENCOUNTER — Telehealth: Payer: Self-pay | Admitting: Pulmonary Disease

## 2017-06-27 MED ORDER — ESOMEPRAZOLE MAGNESIUM 40 MG PO CPDR: 40.0000 mg | DELAYED_RELEASE_CAPSULE | Freq: Every day | ORAL | 2 refills | Status: AC

## 2017-06-27 NOTE — Telephone Encounter (Signed)
Spoke with the pharmacist and he wanted to make sure that we knew she was on Protonix and I advised him that per Dr. Reginia NaasAlva's note. He did want her to be switched to Nexium instead of Protonix. Huston FoleyBrady understood and nothing further is needed.   Instructions      Return in about 2 months (around 08/22/2017).  Chest x-ray today.  Prescription will be sent to DME for new CPAP 10 cm with nasal pillows and other CPAP supplies  Refill on nexium instead of  protonix

## 2017-06-27 NOTE — Telephone Encounter (Signed)
Returned call to [facility], Teacher, early years/prepharmacist.  He needed Nexium clarification.  Nexium ordered 40mg  packets, instead of Nexium 40mg  PO, daily, ##30 with 2 refills.  Nothing further at this time.

## 2017-07-04 ENCOUNTER — Ambulatory Visit (INDEPENDENT_AMBULATORY_CARE_PROVIDER_SITE_OTHER): Payer: Medicare Other

## 2017-07-04 DIAGNOSIS — R55 Syncope and collapse: Secondary | ICD-10-CM | POA: Diagnosis not present

## 2017-07-05 ENCOUNTER — Ambulatory Visit: Payer: Medicare Other | Admitting: Pulmonary Disease

## 2017-07-06 ENCOUNTER — Other Ambulatory Visit: Payer: Self-pay | Admitting: Endocrinology

## 2017-07-14 ENCOUNTER — Other Ambulatory Visit: Payer: Self-pay | Admitting: Internal Medicine

## 2017-07-14 ENCOUNTER — Other Ambulatory Visit: Payer: Self-pay | Admitting: Endocrinology

## 2017-07-24 ENCOUNTER — Other Ambulatory Visit: Payer: Self-pay | Admitting: Internal Medicine

## 2017-07-26 ENCOUNTER — Other Ambulatory Visit (HOSPITAL_COMMUNITY): Payer: Medicare Other

## 2017-07-29 ENCOUNTER — Telehealth: Payer: Self-pay

## 2017-07-29 NOTE — Telephone Encounter (Signed)
Called patient left message advised need to reschedule lab appointment.

## 2017-08-04 ENCOUNTER — Telehealth: Payer: Self-pay | Admitting: Pulmonary Disease

## 2017-08-04 ENCOUNTER — Ambulatory Visit (INDEPENDENT_AMBULATORY_CARE_PROVIDER_SITE_OTHER): Payer: Medicare Other | Admitting: Pulmonary Disease

## 2017-08-04 ENCOUNTER — Encounter: Payer: Self-pay | Admitting: Pulmonary Disease

## 2017-08-04 DIAGNOSIS — G4733 Obstructive sleep apnea (adult) (pediatric): Secondary | ICD-10-CM

## 2017-08-04 DIAGNOSIS — I3139 Other pericardial effusion (noninflammatory): Secondary | ICD-10-CM

## 2017-08-04 DIAGNOSIS — I313 Pericardial effusion (noninflammatory): Secondary | ICD-10-CM | POA: Diagnosis not present

## 2017-08-04 NOTE — Assessment & Plan Note (Signed)
CPAP supplies will be renewed for a year. CPAP is set at 10 cm and is working well.  Fluid around the heart has resolved on your echo when you were in the hospital.  Please contact her cardiologist you could probably stop taking colchicine  Weight loss encouraged, compliance with goal of at least 4-6 hrs every night is the expectation. Advised against medications with sedative side effects Cautioned against driving when sleepy - understanding that sleepiness will vary on a day to day basis

## 2017-08-04 NOTE — Telephone Encounter (Signed)
Fluid around the heart has resolved on  echo when she was in the hospital.    Can she stop taking colchicine? Please  have your staff cancel her rpt echo & inform the patient if so  - thanks

## 2017-08-04 NOTE — Telephone Encounter (Signed)
Yes, she can stop colchicine, it has been 3 months. We will call her. Thank you. MCr

## 2017-08-04 NOTE — Patient Instructions (Signed)
CPAP supplies will be renewed for a year. CPAP is set at 10 cm and is working well.  Fluid around the heart has resolved on your echo when you were in the hospital.  Please contact her cardiologist you could probably stop taking colchicine

## 2017-08-04 NOTE — Progress Notes (Signed)
   Subjective:    Patient ID: [name], female    DOB: April 21, 1940, 77 y.o.   MRN: [id number]  HPI  77 year old female never smoker  for FU of OSA OSA She has been maintained on CPAP 10 cm since 2010  She was admitted 04/2017 for progressive shortness of breath. She was found to have a large pericardial effusion and left pleural effusion. She was treated with anti-inflammatories & colchicine.She underwent a left thoracentesis cytology was positive for reactive cells, low LDH but prot 3.6 >>. Was seen by cardiology and felt that this was possibly viral in nature. ANA was negative. ESR and CRP were elevated.   She is a diabetic on insulin pump hypertensive, has mood disorders on carbamazepine and Zyprexa and is on methotrexate for a skin rash by dermatology  Her last visit, in the office, she had seizure-like activity and had to be emergently hospitalized, EEG was negative MRI was negative, presumptive diagnosis was TIA.  She has done really well since then, received a new CPAP machine with seems to be working well.  No problems with mask or pressure.  She has settled down with nasal pillows. CPAP download was reviewed which shows excellent control of events on 10 cm with no leak and excellent compliance.  Of note, she had an echo performed in the hospital which shows no pericardial effusion.  She remains on colchicine which she is tolerating well   Significant tests/ events reviewed  NPSG ([place] ) 05/2017 AHI 24/h, supine AHI 34/h    Review of Systems Patient denies significant dyspnea,cough, hemoptysis,  chest pain, palpitations, pedal edema, orthopnea, paroxysmal nocturnal dyspnea, lightheadedness, nausea, vomiting, abdominal or  leg pains      Objective:   Physical Exam  Gen. Pleasant, obese, in no distress ENT - no lesions, no post nasal drip Neck: No JVD, no thyromegaly, no carotid bruits Lungs: no use of accessory muscles, no dullness to percussion, decreased  without rales or rhonchi  Cardiovascular: Rhythm regular, heart sounds  normal, no murmurs or gallops, no peripheral edema Musculoskeletal: No deformities, no cyanosis or clubbing , no tremors       Assessment & Plan:

## 2017-08-04 NOTE — Assessment & Plan Note (Signed)
Fluid around the heart has resolved on echo when in the hospital.  Please contact her cardiologist you could probably stop taking colchicine

## 2017-08-08 ENCOUNTER — Telehealth: Payer: Self-pay

## 2017-08-08 NOTE — Telephone Encounter (Signed)
Patient notified. Medical record updated.

## 2017-08-08 NOTE — Telephone Encounter (Signed)
-----   Message from Thurmon FairMihai Croitoru, MD sent at 08/04/2017  1:05 PM EDT ----- Can stop colchicine, please let her know MCr

## 2017-08-10 ENCOUNTER — Ambulatory Visit (HOSPITAL_COMMUNITY): Payer: Medicare Other | Attending: Internal Medicine

## 2017-08-10 ENCOUNTER — Other Ambulatory Visit: Payer: Self-pay

## 2017-08-10 DIAGNOSIS — E669 Obesity, unspecified: Secondary | ICD-10-CM | POA: Diagnosis not present

## 2017-08-10 DIAGNOSIS — I10 Essential (primary) hypertension: Secondary | ICD-10-CM | POA: Diagnosis not present

## 2017-08-10 DIAGNOSIS — E119 Type 2 diabetes mellitus without complications: Secondary | ICD-10-CM | POA: Insufficient documentation

## 2017-08-10 DIAGNOSIS — I313 Pericardial effusion (noninflammatory): Secondary | ICD-10-CM | POA: Diagnosis present

## 2017-08-10 DIAGNOSIS — I3139 Other pericardial effusion (noninflammatory): Secondary | ICD-10-CM

## 2017-08-10 DIAGNOSIS — E785 Hyperlipidemia, unspecified: Secondary | ICD-10-CM | POA: Diagnosis not present

## 2017-08-24 ENCOUNTER — Other Ambulatory Visit (INDEPENDENT_AMBULATORY_CARE_PROVIDER_SITE_OTHER): Payer: Medicare Other

## 2017-08-24 DIAGNOSIS — E1165 Type 2 diabetes mellitus with hyperglycemia: Secondary | ICD-10-CM

## 2017-08-24 DIAGNOSIS — Z794 Long term (current) use of insulin: Secondary | ICD-10-CM | POA: Diagnosis not present

## 2017-08-24 LAB — BASIC METABOLIC PANEL
BUN: 20 mg/dL (ref 6–23)
CALCIUM: 9.7 mg/dL (ref 8.4–10.5)
CO2: 33 mEq/L — ABNORMAL HIGH (ref 19–32)
Chloride: 96 mEq/L (ref 96–112)
Creatinine, Ser: 0.92 mg/dL (ref 0.40–1.20)
GFR: 63.03 mL/min (ref >60.00)
GLUCOSE: 197 mg/dL — AB (ref 70–99)
Potassium: 4 mEq/L (ref 3.5–5.1)
SODIUM: 136 meq/L (ref 135–145)

## 2017-08-24 LAB — HEMOGLOBIN A1C: HEMOGLOBIN A1C: 7.4 % — AB (ref 4.6–6.5)

## 2017-08-29 ENCOUNTER — Encounter: Payer: Self-pay | Admitting: Endocrinology

## 2017-08-29 ENCOUNTER — Ambulatory Visit (INDEPENDENT_AMBULATORY_CARE_PROVIDER_SITE_OTHER): Payer: Medicare Other | Admitting: Endocrinology

## 2017-08-29 ENCOUNTER — Telehealth: Payer: Self-pay | Admitting: Emergency Medicine

## 2017-08-29 VITALS — BP 132/82 | HR 81 | Ht 63.0 in | Wt 215.0 lb

## 2017-08-29 DIAGNOSIS — E063 Autoimmune thyroiditis: Secondary | ICD-10-CM | POA: Diagnosis not present

## 2017-08-29 DIAGNOSIS — E1165 Type 2 diabetes mellitus with hyperglycemia: Secondary | ICD-10-CM

## 2017-08-29 DIAGNOSIS — Z794 Long term (current) use of insulin: Secondary | ICD-10-CM

## 2017-08-29 MED ORDER — SEMAGLUTIDE(0.25 OR 0.5MG/DOS) 2 MG/1.5ML ~~LOC~~ SOPN: 0.5000 mg | PEN_INJECTOR | SUBCUTANEOUS | 0 refills | Status: DC

## 2017-08-29 NOTE — Telephone Encounter (Signed)
Pt called and wants to know if she is still suppose to take her insulin lispro (HUMALOG) 100 UNIT/ML injection. Please call her back and let her know. Thanks.

## 2017-08-29 NOTE — Progress Notes (Signed)
Patient ID: [name], female   DOB: [birth year], 77 y.o.   MRN: [id number]    Reason for Appointment: Followup for Type 2 Diabetes    History of Present Illness:          Diagnosis: Type 2 diabetes mellitus, date of diagnosis:2011       Past history: Since she was apparently intolerant to metformin she was treated with Onglyza until about 2014  At that time because of increasing A1c of about 8% she was started on Levemir 15 units and this was aggressively increased Onglyza was continued She has not tried any other treatments for diabetes Her A1c has been 10-10.5 in 2015 In 3/15 she was told to start small doses of NovoLog at lunch and supper and add 15 units of Levemir at night also To control  hyperglycemia with her basal bolus insulin regimen she was given Victoza in addition on her initial consultation in 5/15 She has been on Victoza since 8/15  She has been on the V-go pump since 09/2014  Recent history:   INSULIN regimen :  V-go pump 40 unit basal, boluses at meal times: 10-28-10 units  Noninsulin hypoglycemic drugs the patient is taking are: Invokana 300 mg, on VICTOZA 1.8 mg   Her A1c is now 7.4 and on her last visit was 7.6  Current management, blood sugar patterns and problems:  She has been tending to have excessive snacks and large amounts of carbohydrates during the day and at night with overall high readings  She says that she is eating very little carbohydrate at suppertime but later at night really more snacks and also some during the night  This is despite taking Victoza consistently 1.8 mg daily in the mornings  She will also run out of insulin for boluses periodically on her pump  Previously taking 7 clicks at suppertime on her pump but now doing 6 clicks with lower carbohydrate meal  However she is not adding any extra insulin for high readings pre-meal  Also fasting readings are mostly high and at least for the last 2 weeks over 140  consistently  She does not do readings after supper  Her weight is down but she previously had more edema  She does try to do some exercise with walking   Meals: 3 meals per day. Bfst oatmeal or egg or cereal at 9 am, dinner 6-7 pm.; no hs snacks   Side effects from medications have been: Metformin: rash  Glucose monitoring:  done 1 time a day or less, mostly in the morning       Glucometer:  One Touch.      Blood Glucose readings from home glucose meter download:     PRE-MEAL Fasting Lunch Dinner Bedtime Overall  Glucose range:  123-204  150-218   202   Mean/median: 164  162   162   POST-MEAL PC Breakfast PC Lunch PC Dinner  Glucose range:   130-183   Mean/median:        Glycemic control:    Lab Results  Component Value Date   HGBA1C 7.4 (H) 08/24/2017   HGBA1C 6.2 (H) 06/23/2017   HGBA1C 7.6 (H) 05/20/2017   Lab Results  Component Value Date   MICROALBUR <0.7 05/26/2017   LDLCALC 73 03/26/2017   CREATININE 0.92 08/24/2017    Self-care: The diet that the patient has been following is: tries to limit fats   Breakfast: She will add [place] bacon or sausage for protein,  usually getting eggs also           Exercise: Walks 20 min 3-4/7  days     Dietician visit: Most recent: 2/19   CDE visit: 9/16     Compliance with the medical regimen: Inconsistent   Weight history:  Wt Readings from Last 3 Encounters:  08/29/17 215 lb (97.5 kg)  08/04/17 210 lb 6.4 oz (95.4 kg)  06/23/17 215 lb 6.4 oz (97.7 kg)    Other active problems: See review of systems    Allergies as of 08/29/2017      Reactions   Flagyl [metronidazole Hcl] Itching, Rash   Ciprofloxacin Itching, Rash   Metformin And Related Rash   Milk-related Compounds Other (See Comments)   Stomach pains   Other Other (See Comments)   [place] nuts cause severe facial redness      Medication List        Accurate as of 08/29/17  2:19 PM. Always use your most recent med list.          aspirin 81  MG EC tablet Take 1 tablet (81 mg total) by mouth daily.   betamethasone dipropionate 0.05 % cream Commonly known as:  DIPROLENE Apply 1 application topically 2 (two) times daily as needed (rash).   carbamazepine 200 MG 12 hr capsule Commonly known as:  CARBATROL Take 200 mg by mouth 2 (two) times daily.   clobetasol 0.05 % Gel Commonly known as:  TEMOVATE Apply 1 application topically 2 (two) times daily as needed (rash).   diltiazem 360 MG 24 hr capsule Commonly known as:  CARDIZEM CD Take 1 capsule (360 mg total) by mouth at bedtime.   donepezil 10 MG tablet Commonly known as:  ARICEPT Take 10 mg by mouth at bedtime.   esomeprazole 40 MG capsule Commonly known as:  NEXIUM Take 1 capsule (40 mg total) by mouth daily at 12 noon.   folic acid 1 MG tablet Commonly known as:  FOLVITE Take 1 mg by mouth daily.   furosemide 40 MG tablet Commonly known as:  LASIX Take 1 tablet (40 mg total) by mouth daily.   gabapentin 300 MG capsule Commonly known as:  NEURONTIN Take 300 mg by mouth 2 (two) times daily.   insulin lispro 100 UNIT/ML injection Commonly known as:  HUMALOG Inject/release 5-6 "clicks" into the skin three times a day with meals (after testing BGL)   INVOKANA 300 MG Tabs tablet Generic drug:  canagliflozin TAKE ONE TABLET BY MOUTH DAILY BEFORE BREAKFAST   levothyroxine 75 MCG tablet Commonly known as:  SYNTHROID, LEVOTHROID Take 75 mcg by mouth daily before breakfast.   liraglutide 18 MG/3ML Sopn Commonly known as:  VICTOZA Inject 0.3 mLs (1.8 mg total) into the skin daily.   methotrexate 2.5 MG tablet Commonly known as:  RHEUMATREX Take 7.5 mg by mouth every Monday. For rash   metoprolol tartrate 25 MG tablet Commonly known as:  LOPRESSOR Take 1 tablet (25 mg total) by mouth 2 (two) times daily.   OLANZapine 5 MG tablet Commonly known as:  ZYPREXA Take 5 mg by mouth at bedtime.   ondansetron 8 MG tablet Commonly known as:  ZOFRAN Take 1  tablet (8 mg total) by mouth every 8 (eight) hours as needed for nausea.   ONETOUCH DELICA LANCETS 54G Misc USE LANCET TO TEST FIVE TIMES A DAY   pantoprazole 40 MG tablet Commonly known as:  PROTONIX Take 1 tablet (40 mg total) by mouth daily.  pravastatin 40 MG tablet Commonly known as:  PRAVACHOL Take 1 tablet (40 mg total) by mouth daily.   Semaglutide 0.25 or 0.5 MG/DOSE Sopn Inject 0.5 mg into the skin once a week.   V-GO 40 Kit USE ONCE DAILY AS DIRECTED       Allergies:  Allergies  Allergen Reactions  . Flagyl [Metronidazole Hcl] Itching and Rash  . Ciprofloxacin Itching and Rash  . Metformin And Related Rash  . Milk-Related Compounds Other (See Comments)    Stomach pains  . Other Other (See Comments)    [place] nuts cause severe facial redness    Past Medical History:  Diagnosis Date  . Bipolar disorder ([place])   . Depression    Bipolar  . GERD (gastroesophageal reflux disease)   . History of hiatal hernia   . History of kidney stones   . History of stomach ulcers 1970s   "bleeding"  . Hyperlipidemia   . Hypertension   . Hypothyroidism   . Ischemic colitis, enteritis, or enterocolitis ([place])   . OSA on CPAP   . Pericarditis 05/06/2017  . Pneumonia    "several times" (06/23/2017)  . Thyroid disease   . TIA (transient ischemic attack) 2011 X 2  . Type II diabetes mellitus ([place])   . Urinary bladder incontinence   . Vertigo     Past Surgical History:  Procedure Laterality Date  . APPENDECTOMY    . CARDIAC CATHETERIZATION N/A 12/24/2014   Procedure: Right/Left Heart Cath and Coronary Angiography;  Surgeon: Sanda Klein, MD;  Location: [facility];  Service: Cardiovascular;  Laterality: N/A;  . CATARACT EXTRACTION W/ INTRAOCULAR LENS  IMPLANT, BILATERAL Bilateral   . EXCISIONAL HEMORRHOIDECTOMY    . EYE SURGERY    . FRACTURE SURGERY    . IR THORACENTESIS ASP PLEURAL SPACE W/IMG GUIDE  05/13/2017  . LAPAROSCOPIC CHOLECYSTECTOMY    . LITHOTRIPSY   "several times"  . RETINAL DETACHMENT SURGERY     "think it was on the left; not sure" (06/23/2017)  . TONSILLECTOMY    . VAGINAL HYSTERECTOMY     partial     Family History  Problem Relation Age of Onset  . Heart disease Mother   . Stroke Father   . Parkinson's disease Father   . Cancer Sister   . Cancer Brother     Social History:  reports that she has never smoked. She has never used smokeless tobacco. She reports that she does not drink alcohol or use drugs.    Review of Systems   She has had less edema and takes Lasix regularly now  NEUROPATHY: She has lower leg pain and takes gabapentin twice a day for this, however does not have any symptoms      Lipids: Last LDL from PCP was 101, followed by PCP also        Lab Results  Component Value Date   CHOL 149 03/26/2017   HDL 50 03/26/2017   LDLCALC 73 03/26/2017   TRIG 130 03/26/2017   CHOLHDL 3.0 03/26/2017       Thyroid:   Has had presumed hypothyroidism for over 20 years, has not had a change in medications in several years and last TSH was 1.9 in August from PCP   Lab Results  Component Value Date   TSH 1.495 06/23/2017   TSH 1.78 05/20/2017   TSH 2.248 05/05/2017   FREET4 1.04 05/05/2017   FREET4 0.86 01/31/2014   FREET4 0.91 08/28/2013  The blood pressure has been controlled with a combination of amlodipine and Benicar as well as hydralazine and bisoprolol      Physical Examination:  BP 132/82 (BP Location: Left Arm, Patient Position: Sitting, Cuff Size: Normal)   Pulse 81   Ht 5' 3"  (1.6 m)   Wt 215 lb (97.5 kg)   SpO2 96%   BMI 38.09 kg/m      ASSESSMENT/PLAN:   Diabetes type 2, uncontrolled   See history of present illness for detailed discussion of his current management, blood sugar patterns and problems identified  Her blood sugars are Looking better now compared to her last visit This has improved mostly with her trying to change her V-go pump consistently around the same time  every evening However not checking enough readings after meals and has only sporadic readings lately Blood sugars maybe higher with eating cereal in the morning and discussed balancing all meals with some protein and not excessive carbohydrate  Other recommendations: She needs to check more readings after supper Also since she thinks she is eating more at lunch she can take about 12 units of cover her lunch and also reduce suppertime dose down to 10-12 units based on her meal size since her readings at night are about 100 She will let us know if she has more variability in her blood sugars but for now will continue the same regimen Also she can try and adjust her doses further with better postprandial monitoring  Follow-up in 3 months again   Patient Instructions  Stop Victoza and Start OZEMPIC 0.64m using the pen as shown once weekly on the same day of the week.   You may inject in the stomach, thigh or arm as indicated in the brochure given. If you have any difficulties using the pen see the video at [email or web address] You will feel fullness of the stomach with starting the medication and should try to keep the portions at meals small.  You may experience nausea in the first few days which usually gets better over time    If you have any questions or concerns please call the office   You may also talk to a nurse educator with [employer] [phone] website: [email or web address]    Extra one click if sugar >>917 Check blood sugars on waking up  4/7  Also check blood sugars about 2 hours after a meal and do this after different meals by rotation  Recommended blood sugar levels on waking up is 90-130 and about 2 hours after meal is 130-160  Please bring your blood sugar monitor to each visit, thank you     AElayne Snare8/12/2017, 2:19 PM   Note: This office note was prepared with Dragon voice recognition system technology. Any transcriptional errors that result  from this process are unintentional.                     Patient ID: [name] female   DOB: 4Jun 11, 1942 77y.o.   MRN: [id number]   Reason for Appointment: Followup for Type 2 Diabetes and swelling of legs  History of Present Illness:          Diagnosis: Type 2 diabetes mellitus, date of diagnosis:2011       Past history: Since she was apparently intolerant to metformin she was treated with Onglyza until about 2014  At that time because of increasing A1c of about 8% she was started on Levemir 15 units  and this was aggressively increased Onglyza was continued She has not tried any other treatments for diabetes Her A1c has been 10-10.5 in 2015 In 3/15 she was told to start small doses of NovoLog at lunch and supper and add 15 units of Levemir at night also To control  hyperglycemia with her basal bolus insulin regimen she was given Victoza in addition on her initial consultation in 5/15  Recent history:   INSULIN regimen :  V-go pump 40 unit basal, boluses at meal times: 5-6-8     Oral hypoglycemic drugs the patient is taking are: None  She was changed on 09/19/14 to the V-go pump instead of basal bolus insulin shots She has been on Victoza since 8/15 and she is taking 1.2 mg  With the V-go pump her blood sugars have improved overall and are less variable  Current blood sugar patterns and problems:  Fasting blood sugars are very consistent, usually just above 100  She has stopped checking her blood sugars later in the day even though she was told to vary the times when she checks her sugar  She has no difficulty remembering to do her boluses with meals but usually does not change the amounts  She has improved her diet and is trying to  eliminate high glycemic index foods and juices  Recently trying to walk more regularly also  No hypoglycemia  She has no difficulty with using the V-go pump, doing the boluses when she is eating out and changing it every  morning      Side effects from medications have been: Metformin: rash  Glucose monitoring:  done 1 time a day         Glucometer:  One Touch.      Blood Glucose readings from home glucose meter download:  FASTING readings recently: 90-147, usually under 130.  Her monitor has incorrect date and time programmed   Glycemic control:    Lab Results  Component Value Date   HGBA1C 7.4 (H) 08/24/2017   HGBA1C 6.2 (H) 06/23/2017   HGBA1C 7.6 (H) 05/20/2017   Lab Results  Component Value Date   MICROALBUR <0.7 05/26/2017   LDLCALC 73 03/26/2017   CREATININE 0.92 08/24/2017    Self-care: The diet that the patient has been following is: tries to limit fats     Meals: 3 meals per day. Bfst oatmeal or egg/toast at 8-9 am, dinner 5 pm cutting back on frequent meals and  portions; no hs snacks           Exercise: Walks 15-20 min       Dietician visit: Most recent: 2014 ( class).               Compliance with the medical regimen: Good  Retinal exam: Most recent:.9/14    Weight history:  Wt Readings from Last 3 Encounters:  08/29/17 215 lb (97.5 kg)  08/04/17 210 lb 6.4 oz (95.4 kg)  06/23/17 215 lb 6.4 oz (97.7 kg)    Allergies as of 08/29/2017      Reactions   Flagyl [metronidazole Hcl] Itching, Rash   Ciprofloxacin Itching, Rash   Metformin And Related Rash   Milk-related Compounds Other (See Comments)   Stomach pains   Other Other (See Comments)   [place] nuts cause severe facial redness      Medication List        Accurate as of 08/29/17  2:19 PM. Always use your most recent med list.  aspirin 81 MG EC tablet Take 1 tablet (81 mg total) by mouth daily.   betamethasone dipropionate 0.05 % cream Commonly known as:  DIPROLENE Apply 1 application topically 2 (two) times daily as needed (rash).   carbamazepine 200 MG 12 hr capsule Commonly known as:  CARBATROL Take 200 mg by mouth 2 (two) times daily.   clobetasol 0.05 % Gel Commonly known as:   TEMOVATE Apply 1 application topically 2 (two) times daily as needed (rash).   diltiazem 360 MG 24 hr capsule Commonly known as:  CARDIZEM CD Take 1 capsule (360 mg total) by mouth at bedtime.   donepezil 10 MG tablet Commonly known as:  ARICEPT Take 10 mg by mouth at bedtime.   esomeprazole 40 MG capsule Commonly known as:  NEXIUM Take 1 capsule (40 mg total) by mouth daily at 12 noon.   folic acid 1 MG tablet Commonly known as:  FOLVITE Take 1 mg by mouth daily.   furosemide 40 MG tablet Commonly known as:  LASIX Take 1 tablet (40 mg total) by mouth daily.   gabapentin 300 MG capsule Commonly known as:  NEURONTIN Take 300 mg by mouth 2 (two) times daily.   insulin lispro 100 UNIT/ML injection Commonly known as:  HUMALOG Inject/release 5-6 "clicks" into the skin three times a day with meals (after testing BGL)   INVOKANA 300 MG Tabs tablet Generic drug:  canagliflozin TAKE ONE TABLET BY MOUTH DAILY BEFORE BREAKFAST   levothyroxine 75 MCG tablet Commonly known as:  SYNTHROID, LEVOTHROID Take 75 mcg by mouth daily before breakfast.   liraglutide 18 MG/3ML Sopn Commonly known as:  VICTOZA Inject 0.3 mLs (1.8 mg total) into the skin daily.   methotrexate 2.5 MG tablet Commonly known as:  RHEUMATREX Take 7.5 mg by mouth every Monday. For rash   metoprolol tartrate 25 MG tablet Commonly known as:  LOPRESSOR Take 1 tablet (25 mg total) by mouth 2 (two) times daily.   OLANZapine 5 MG tablet Commonly known as:  ZYPREXA Take 5 mg by mouth at bedtime.   ondansetron 8 MG tablet Commonly known as:  ZOFRAN Take 1 tablet (8 mg total) by mouth every 8 (eight) hours as needed for nausea.   ONETOUCH DELICA LANCETS 01B Misc USE LANCET TO TEST FIVE TIMES A DAY   pantoprazole 40 MG tablet Commonly known as:  PROTONIX Take 1 tablet (40 mg total) by mouth daily.   pravastatin 40 MG tablet Commonly known as:  PRAVACHOL Take 1 tablet (40 mg total) by mouth daily.    Semaglutide 0.25 or 0.5 MG/DOSE Sopn Inject 0.5 mg into the skin once a week.   V-GO 40 Kit USE ONCE DAILY AS DIRECTED       Allergies:  Allergies  Allergen Reactions  . Flagyl [Metronidazole Hcl] Itching and Rash  . Ciprofloxacin Itching and Rash  . Metformin And Related Rash  . Milk-Related Compounds Other (See Comments)    Stomach pains  . Other Other (See Comments)    [place] nuts cause severe facial redness    Past Medical History:  Diagnosis Date  . Bipolar disorder ([place])   . Depression    Bipolar  . GERD (gastroesophageal reflux disease)   . History of hiatal hernia   . History of kidney stones   . History of stomach ulcers 1970s   "bleeding"  . Hyperlipidemia   . Hypertension   . Hypothyroidism   . Ischemic colitis, enteritis, or enterocolitis ([place])   . OSA  on CPAP   . Pericarditis 05/06/2017  . Pneumonia    "several times" (06/23/2017)  . Thyroid disease   . TIA (transient ischemic attack) 2011 X 2  . Type II diabetes mellitus ([place])   . Urinary bladder incontinence   . Vertigo     Past Surgical History:  Procedure Laterality Date  . APPENDECTOMY    . CARDIAC CATHETERIZATION N/A 12/24/2014   Procedure: Right/Left Heart Cath and Coronary Angiography;  Surgeon: Sanda Klein, MD;  Location: [facility];  Service: Cardiovascular;  Laterality: N/A;  . CATARACT EXTRACTION W/ INTRAOCULAR LENS  IMPLANT, BILATERAL Bilateral   . EXCISIONAL HEMORRHOIDECTOMY    . EYE SURGERY    . FRACTURE SURGERY    . IR THORACENTESIS ASP PLEURAL SPACE W/IMG GUIDE  05/13/2017  . LAPAROSCOPIC CHOLECYSTECTOMY    . LITHOTRIPSY  "several times"  . RETINAL DETACHMENT SURGERY     "think it was on the left; not sure" (06/23/2017)  . TONSILLECTOMY    . VAGINAL HYSTERECTOMY     partial     Family History  Problem Relation Age of Onset  . Heart disease Mother   . Stroke Father   . Parkinson's disease Father   . Cancer Sister   . Cancer Brother     Social History:   reports that she has never smoked. She has never used smokeless tobacco. She reports that she does not drink alcohol or use drugs.    Review of Systems      The blood pressure has been usually controlled with a combination of Bisoprolol, diltiazem  as well as Aldactone.   She does have a monitor at home  Benicar previously stopped because of  Diarrhea   Lab Results  Component Value Date   CREATININE 0.92 08/24/2017   BUN 20 08/24/2017   NA 136 08/24/2017   K 4.0 08/24/2017   CL 96 08/24/2017   CO2 33 (H) 08/24/2017    NEUROPATHY: She has lower leg neuropathic pains and takes gabapentin twice a day  LIPIDS: Controlled with 20 mg pravastatin       Lab Results  Component Value Date   CHOL 149 03/26/2017   HDL 50 03/26/2017   LDLCALC 73 03/26/2017   TRIG 130 03/26/2017   CHOLHDL 3.0 03/26/2017       Thyroid:    Has had hypothyroidism for over 20 years, taking 75 mcg levothyroxine  Last TSH normal  Lab Results  Component Value Date   TSH 1.495 06/23/2017   TSH 1.78 05/20/2017   TSH 2.248 05/05/2017   FREET4 1.04 05/05/2017   FREET4 0.86 01/31/2014   FREET4 0.91 08/28/2013    Depression: She is on long-term Zyprexa 5 mg daily     Physical Examination:  BP 132/82 (BP Location: Left Arm, Patient Position: Sitting, Cuff Size: Normal)   Pulse 81   Ht 5' 3"  (1.6 m)   Wt 215 lb (97.5 kg)   SpO2 96%   BMI 38.09 kg/m     ASSESSMENT/PLAN:   Diabetes type 2, current BMI 39.5 See history of present illness for detailed discussion of current  management, blood sugar patterns and problems identified.  She is on a regimen of the V-go pump and also Invokana and Victoza  A1c is 7.4%, has been better previously  However she is having fairly consistently high readings with average 165 at home and consistently high fasting also Also probably has high postprandial readings when she is not watching her diet  and snacks as discussed above Probably is not getting  enough insulin available in her pump for her boluses as well as basal This is despite continuing Victoza and Invokana  Discussed options for better control  We will see if she can benefit better from using Ozempic compared to Victoza since this will allow smaller insulin doses and hopefully better satiety  Showed her how to use the Ozempic pen  She will try 0.5 mg weekly for the first month and then if tolerated she will call for prescription of 1 mg weekly  Discussed with her the use of the Omnipod pump and how this would work and the differences between this and the V-go pump  Will get insurance approval for this and plan to switch when this is available unless her blood sugars are significantly better before then  She needs to do consistent exercise  Hopefully she can do better with her dietary compliance  Discussed increasing boluses at mealtime by 2 units for glucose of 160  She also needs to check more readings after evening meal to help her adjust her boluses  Detailed education pamphlets given on Omnipod and the Ozempic pen    Patient Instructions  Stop Victoza and Start OZEMPIC 0.85m using the pen as shown once weekly on the same day of the week.   You may inject in the stomach, thigh or arm as indicated in the brochure given. If you have any difficulties using the pen see the video at [email or web address] You will feel fullness of the stomach with starting the medication and should try to keep the portions at meals small.  You may experience nausea in the first few days which usually gets better over time    If you have any questions or concerns please call the office   You may also talk to a nurse educator with [employer] [phone] website: [email or web address]    Extra one click if sugar >>103 Check blood sugars on waking up  4/7  Also check blood sugars about 2 hours after a meal and do this after different meals by rotation  Recommended  blood sugar levels on waking up is 90-130 and about 2 hours after meal is 130-160  Please bring your blood sugar monitor to each visit, thank you    Counseling time on subjects discussed in assessment and plan sections is over 50% of today's 25 minute visit   AElayne Snare8/12/2017, 2:19 PM   Note: This office note was prepared with Dragon voice recognition system technology. Any transcriptional errors that result from this process are unintentional.

## 2017-08-29 NOTE — Patient Instructions (Addendum)
Stop Victoza and Start OZEMPIC 0.5mg  using the pen as shown once weekly on the same day of the week.   You may inject in the stomach, thigh or arm as indicated in the brochure given. If you have any difficulties using the pen see the video at [email or web address]  You will feel fullness of the stomach with starting the medication and should try to keep the portions at meals small.  You may experience nausea in the first few days which usually gets better over time    If you have any questions or concerns please call the office   You may also talk to a nurse educator with [employer] at [phone] Useful website: [email or web address].[email or web address]    Extra one click if sugar >160  Check blood sugars on waking up  4/7  Also check blood sugars about 2 hours after a meal and do this after different meals by rotation  Recommended blood sugar levels on waking up is 90-130 and about 2 hours after meal is 130-160  Please bring your blood sugar monitor to each visit, thank you

## 2017-08-30 ENCOUNTER — Telehealth: Payer: Self-pay | Admitting: Emergency Medicine

## 2017-08-30 NOTE — Telephone Encounter (Signed)
Discussed humalog with patient and she stated an understanding on how to properly take her medication

## 2017-08-30 NOTE — Telephone Encounter (Signed)
Spoke to the patient and she is refusing to try Ozempic due to side affects- she stated cancer runs in her family and one of the side affects is thyroid cancer she would like to know what she can take as an alternative- please advise

## 2017-08-30 NOTE — Telephone Encounter (Signed)
LMTCB to discus medication

## 2017-08-30 NOTE — Telephone Encounter (Signed)
The thyroid cancer issue is related to rats and mice and not shown to occur at all in human beings.  The same issue is on the Victoza label that she was taking and is not relevant.  Since she is having so much trouble with her blood sugar control she would need to be on Ozempic

## 2017-08-30 NOTE — Telephone Encounter (Signed)
Pt called and stated she doesn't want to take Ozempic. She said she read some of the side effects and is scared to take it. She asked if there is something else similar to Ozempic that she can take that doesn't have the same side effects. Please advise thanks.

## 2017-09-01 NOTE — Telephone Encounter (Signed)
Gave patient MD advice from note below and she stated an understanding

## 2017-09-09 ENCOUNTER — Ambulatory Visit (INDEPENDENT_AMBULATORY_CARE_PROVIDER_SITE_OTHER): Payer: Medicare Other | Admitting: Cardiovascular Disease

## 2017-09-09 ENCOUNTER — Encounter: Payer: Self-pay | Admitting: Cardiovascular Disease

## 2017-09-09 VITALS — BP 121/62 | Ht 63.0 in | Wt 214.0 lb

## 2017-09-09 DIAGNOSIS — I301 Infective pericarditis: Secondary | ICD-10-CM

## 2017-09-09 DIAGNOSIS — G4733 Obstructive sleep apnea (adult) (pediatric): Secondary | ICD-10-CM

## 2017-09-09 DIAGNOSIS — I10 Essential (primary) hypertension: Secondary | ICD-10-CM | POA: Diagnosis not present

## 2017-09-09 DIAGNOSIS — E7800 Pure hypercholesterolemia, unspecified: Secondary | ICD-10-CM

## 2017-09-09 NOTE — Patient Instructions (Signed)
Dr Royann Shiversroitoru has recommended making the following medication changes: 1. WEAN OFF Metoprolol - take 0.5 tablet DAILY through the weekend THEN STOP  Your physician has requested that you regularly monitor your blood pressure at home. Please use the same machine to check your blood pressure daily. Keep a record of your blood pressures using the log sheet provided. In 1 week, please report your readings back to Dr C. You may use our online patient portal 'MyChart' or you can call the office to speak with a nurse.  Dr Royann Shiversroitoru recommends that you schedule a follow-up appointment in 12 months. You will receive a reminder letter in the mail two months in advance. If you don't receive a letter, please call our office to schedule the follow-up appointment.  If you need a refill on your cardiac medications before your next appointment, please call your pharmacy.

## 2017-09-09 NOTE — Progress Notes (Signed)
Cardiology Office Note    Date:  09/10/2017   ID:  [name], DOB 12-23-1940, MRN [id number]  PCP:  Merrilee Seashore, MD  Cardiologist:   Sanda Klein, MD   Chief Complaint  Patient presents with  . Follow-up    History of Present Illness:  [name] is a 77 y.o. female with obesity, type 2 diabetes mellitus, previous history of TIA and possible ischemic colitis, hypertension. She has normal left ventricular systolic function and regional wall motion without valvular abnormalities.  She has struggled over the last several months with a protracted course of acute pericarditis, finally resolved both clinically and by echocardiographic follow-up.  Cardiac MRI in April 2019 showed normal left ventricular systolic function with mild LVH and no evidence of late gadolinium enhancement.  Last echo on August 10, 2017 showed complete resolution of the pericardial effusion.  She had normal coronary arteries by catheterization in 2016.  She seems to be feeling much better although she is currently struggling with an episode of acute bronchitis for which she was prescribed a prednisone Dosepak.  She denies exertional dyspnea.  She denies leg edema, orthopnea, PND, but does have some cough and wheezing.  She is only taking 12.5 mg twice daily of metoprolol.  She plans to see a geriatrician since she wants to simplify her very lengthy list of medications which are prescribed not only for heart disease but also rheumatoid arthritis and diabetes.  She also takes olanzapine and Aricept.  Believes that the Aricept has helped her with trouble finding words.  Past Medical History:  Diagnosis Date  . Bipolar disorder ([place])   . Depression    Bipolar  . GERD (gastroesophageal reflux disease)   . History of hiatal hernia   . History of kidney stones   . History of stomach ulcers 1970s   "bleeding"  . Hyperlipidemia   . Hypertension   . Hypothyroidism   . Ischemic colitis, enteritis,  or enterocolitis ([place])   . OSA on CPAP   . Pericarditis 05/06/2017  . Pneumonia    "several times" (06/23/2017)  . Thyroid disease   . TIA (transient ischemic attack) 2011 X 2  . Type II diabetes mellitus ([place])   . Urinary bladder incontinence   . Vertigo     Past Surgical History:  Procedure Laterality Date  . APPENDECTOMY    . CARDIAC CATHETERIZATION N/A 12/24/2014   Procedure: Right/Left Heart Cath and Coronary Angiography;  Surgeon: Sanda Klein, MD;  Location: [facility];  Service: Cardiovascular;  Laterality: N/A;  . CATARACT EXTRACTION W/ INTRAOCULAR LENS  IMPLANT, BILATERAL Bilateral   . EXCISIONAL HEMORRHOIDECTOMY    . EYE SURGERY    . FRACTURE SURGERY    . IR THORACENTESIS ASP PLEURAL SPACE W/IMG GUIDE  05/13/2017  . LAPAROSCOPIC CHOLECYSTECTOMY    . LITHOTRIPSY  "several times"  . RETINAL DETACHMENT SURGERY     "think it was on the left; not sure" (06/23/2017)  . TONSILLECTOMY    . VAGINAL HYSTERECTOMY     partial     Current Medications: Outpatient Medications Prior to Visit  Medication Sig Dispense Refill  . aspirin EC 81 MG EC tablet Take 1 tablet (81 mg total) by mouth daily. 30 tablet 0  . azithromycin (ZITHROMAX) 250 MG tablet Take by mouth daily.    . betamethasone dipropionate (DIPROLENE) 0.05 % cream Apply 1 application topically 2 (two) times daily as needed (rash).     . carbamazepine (CARBATROL) 200  MG 12 hr capsule Take 200 mg by mouth 2 (two) times daily.     . clobetasol (TEMOVATE) 0.05 % GEL Apply 1 application topically 2 (two) times daily as needed (rash).     Marland Kitchen diltiazem (CARDIZEM CD) 360 MG 24 hr capsule Take 1 capsule (360 mg total) by mouth at bedtime. 30 capsule 0  . donepezil (ARICEPT) 10 MG tablet Take 10 mg by mouth at bedtime.     Marland Kitchen esomeprazole (NEXIUM) 40 MG capsule Take 1 capsule (40 mg total) by mouth daily at 12 noon. 30 capsule 2  . folic acid (FOLVITE) 1 MG tablet Take 1 mg by mouth daily.    . furosemide (LASIX) 40 MG tablet  Take 1 tablet (40 mg total) by mouth daily. 30 tablet 3  . gabapentin (NEURONTIN) 300 MG capsule Take 300 mg by mouth 2 (two) times daily.    . Insulin Disposable Pump (V-GO 40) KIT USE ONCE DAILY AS DIRECTED 1 kit 3  . insulin lispro (HUMALOG) 100 UNIT/ML injection Inject/release 5-6 "clicks" into the skin three times a day with meals (after testing BGL) 20 mL 3  . INVOKANA 300 MG TABS tablet TAKE ONE TABLET BY MOUTH DAILY BEFORE BREAKFAST 90 tablet 0  . levothyroxine (SYNTHROID, LEVOTHROID) 75 MCG tablet Take 75 mcg by mouth daily before breakfast.     . liraglutide (VICTOZA) 18 MG/3ML SOPN Inject 0.3 mLs (1.8 mg total) into the skin daily. 3 pen 3  . methotrexate (RHEUMATREX) 2.5 MG tablet Take 7.5 mg by mouth every Monday. For rash    . OLANZapine (ZYPREXA) 5 MG tablet Take 5 mg by mouth at bedtime.    . ondansetron (ZOFRAN) 8 MG tablet Take 1 tablet (8 mg total) by mouth every 8 (eight) hours as needed for nausea. 9 tablet 0  . ONETOUCH DELICA LANCETS 12Y MISC USE LANCET TO TEST FIVE TIMES A DAY 200 each 0  . pantoprazole (PROTONIX) 40 MG tablet Take 1 tablet (40 mg total) by mouth daily. 30 tablet 0  . pravastatin (PRAVACHOL) 40 MG tablet Take 1 tablet (40 mg total) by mouth daily. 30 tablet 0  . predniSONE (STERAPRED UNI-PAK 21 TAB) 10 MG (21) TBPK tablet Take by mouth daily.    . Semaglutide (OZEMPIC) 0.25 or 0.5 MG/DOSE SOPN Inject 0.5 mg into the skin once a week. 1 pen 0  . metoprolol tartrate (LOPRESSOR) 25 MG tablet Take 1 tablet (25 mg total) by mouth 2 (two) times daily. (Patient taking differently: Take 12.5 mg by mouth 2 (two) times daily. ) 180 tablet 3   No facility-administered medications prior to visit.      Allergies:   Flagyl [metronidazole hcl]; Ciprofloxacin; Metformin and related; Milk-related compounds; and Other   Social History   Socioeconomic History  . Marital status: Widowed    Spouse name: Not on file  . Number of children: 2  . Years of education: 58  .  Highest education level: Not on file  Occupational History  . Occupation: Retired  Scientific laboratory technician  . Financial resource strain: Not on file  . Food insecurity:    Worry: Not on file    Inability: Not on file  . Transportation needs:    Medical: Not on file    Non-medical: Not on file  Tobacco Use  . Smoking status: Never Smoker  . Smokeless tobacco: Never Used  Substance and Sexual Activity  . Alcohol use: Never    Frequency: Never  . Drug use:  Never  . Sexual activity: Not on file  Lifestyle  . Physical activity:    Days per week: Not on file    Minutes per session: Not on file  . Stress: Not on file  Relationships  . Social connections:    Talks on phone: Not on file    Gets together: Not on file    Attends religious service: Not on file    Active member of club or organization: Not on file    Attends meetings of clubs or organizations: Not on file    Relationship status: Not on file  Other Topics Concern  . Not on file  Social History Narrative   Lives at home alone.   Right-handed.   No caffeine use.     Family History:  The patient's family history includes Cancer in her brother and sister; Heart disease in her mother; Parkinson's disease in her father; Stroke in her father.   ROS:   Please see the history of present illness.    ROS All other systems reviewed and are negative.   PHYSICAL EXAM:   VS:  BP 121/62   Ht _0  (1.6 m)   Wt 214 lb (97.1 kg)   BMI 37.91 kg/m     General: Alert, oriented x3, no distress, moderately obese Head: no evidence of trauma, PERRL, EOMI, no exophtalmos or lid lag, no myxedema, no xanthelasma; normal ears, nose and oropharynx Neck: normal jugular venous pulsations and no hepatojugular reflux; brisk carotid pulses without delay and no carotid bruits Chest: A few scattered wheezes and rhonchi, but no moist rales, no signs of consolidation by percussion or palpation, normal fremitus, symmetrical and full respiratory  excursions Cardiovascular: normal position and quality of the apical impulse, regular rhythm, normal first and second heart sounds, no murmurs, rubs or gallops Abdomen: no tenderness or distention, no masses by palpation, no abnormal pulsatility or arterial bruits, normal bowel sounds, no hepatosplenomegaly Extremities: no clubbing, cyanosis or edema; 2+ radial, ulnar and brachial pulses bilaterally; 2+ right femoral, posterior tibial and dorsalis pedis pulses; 2+ left femoral, posterior tibial and dorsalis pedis pulses; no subclavian or femoral bruits Neurological: grossly nonfocal Psych: Normal mood and affect   Wt Readings from Last 3 Encounters:  09/09/17 214 lb (97.1 kg)  08/29/17 215 lb (97.5 kg)  08/04/17 210 lb 6.4 oz (95.4 kg)      Studies/Labs Reviewed:   EKG:  EKG is not ordered today.  She is known to have a chronic left anterior fascicular block Recent Labs: 05/12/2017: B Natriuretic Peptide 139.3 06/23/2017: ALT 16; Hemoglobin 11.2; Platelets 445; TSH 1.495 08/24/2017: BUN 20; Creatinine, Ser 0.92; Potassium 4.0; Sodium 136   Lipid Panel    Component Value Date/Time   CHOL 149 03/26/2017 1710   TRIG 130 03/26/2017 1710   HDL 50 03/26/2017 1710   CHOLHDL 3.0 03/26/2017 1710   VLDL 26 03/26/2017 1710   LDLCALC 73 03/26/2017 1710      ASSESSMENT:    1. Acute viral pericarditis   2. Essential hypertension   3. Hypercholesterolemia   4. OSA (obstructive sleep apnea)      PLAN:  In order of problems listed above:  1. Acute pericarditis: It took a while, but this resolved completely.  Hopefully was a post viral condition and not related to a rheumatological disorder, since this would make it more likely to recur.   2. HTN: Very well controlled.  I think she can discontinue her metoprolol altogether (asked her  to wean it by taking it once daily for the next 3-4 days before stopping it completely).  She reminded me that we were unable to control her blood pressure  without a calcium channel blocker in the past. 3. HLP: All lipid parameters very close to target on recent labs 4. OSA:  Sees Dr. Elsworth Soho.  Weight loss continues to be a highly desirable goal.   Medication Adjustments/Labs and Tests Ordered: Current medicines are reviewed at length with the patient today.  Concerns regarding medicines are outlined above.  Medication changes, Labs and Tests ordered today are listed in the Patient Instructions below. Patient Instructions  Dr Sallyanne Kuster has recommended making the following medication changes: 1. WEAN OFF Metoprolol - take 0.5 tablet DAILY through the weekend THEN STOP  Your physician has requested that you regularly monitor your blood pressure at home. Please use the same machine to check your blood pressure daily. Keep a record of your blood pressures using the log sheet provided. In 1 week, please report your readings back to Dr C. You may use our online patient portal 'MyChart' or you can call the office to speak with a nurse.  Dr Sallyanne Kuster recommends that you schedule a follow-up appointment in 12 months. You will receive a reminder letter in the mail two months in advance. If you don't receive a letter, please call our office to schedule the follow-up appointment.  If you need a refill on your cardiac medications before your next appointment, please call your pharmacy.    Signed, Sanda Klein, MD  09/10/2017 2:28 PM    [address], [place],   [address] Phone: [phone]; Fax: [phone]

## 2017-09-20 ENCOUNTER — Telehealth: Payer: Self-pay | Admitting: Pulmonary Disease

## 2017-09-20 NOTE — Telephone Encounter (Signed)
Pt gave [place] the wrong fax number. The right fax# [phone]  Dr. Conley Rolls, [place] Picket

## 2017-09-20 NOTE — Telephone Encounter (Signed)
Called and spoke to patient's son, [name], who is listed on the [place].  They need medical records sent to [facility].  [name] the son our number for medical records department [phone] and the fax number [phone]. Patient's son stated that he will contact medical records to get everything needed sent over. Nothing further needed at this time.

## 2017-09-21 ENCOUNTER — Telehealth: Payer: Self-pay | Admitting: Endocrinology

## 2017-09-21 NOTE — Telephone Encounter (Signed)
Patient does want to use Omnipod system. It is too technology based for her. Family agrees after studying the material. She would like to know what other options she has that would be more suitable for her. Please call patient at ph# [phone] to advise. Patient will be home after 3 pm today. Readings are staying in the 150-160's or higher.

## 2017-09-21 NOTE — Telephone Encounter (Signed)
With the tresiba . How many do I send in? Any refills? Are they pens?

## 2017-09-21 NOTE — Telephone Encounter (Signed)
Please advise 

## 2017-09-21 NOTE — Telephone Encounter (Signed)
She can continue using the V-go pump and start taking additional 6 units of Tresiba in the evening once daily to bring her morning sugars down, will need new prescription for Tresiba U-100

## 2017-09-21 NOTE — Telephone Encounter (Signed)
Evaristo Bury will be sent as one box of 5 pens, no refills.  Also send pen needles, 5 mm size, one box

## 2017-09-23 MED ORDER — INSULIN DEGLUDEC 100 UNIT/ML ~~LOC~~ SOPN: 6.0000 [IU] | PEN_INJECTOR | Freq: Every day | SUBCUTANEOUS | 0 refills | Status: DC

## 2017-09-23 MED ORDER — INSULIN PEN NEEDLE 33G X 5 MM MISC: 1.0000 | Freq: Every day | 0 refills | Status: DC

## 2017-09-23 NOTE — Addendum Note (Signed)
Addended by: Yolande Jolly on: 09/23/2017 03:39 PM   Modules accepted: Orders

## 2017-09-23 NOTE — Telephone Encounter (Signed)
Sent!

## 2017-09-25 ENCOUNTER — Other Ambulatory Visit: Payer: Self-pay | Admitting: Endocrinology

## 2017-09-26 ENCOUNTER — Telehealth: Payer: Self-pay | Admitting: Pulmonary Disease

## 2017-09-26 NOTE — Telephone Encounter (Signed)
Called and spoke with patient advised her that RA is out of the office until 9.16.19. I offered to send the message to another physician but she would rather RA handle this.    Ra please advise on patient message. Thank you.

## 2017-09-27 ENCOUNTER — Other Ambulatory Visit: Payer: Self-pay | Admitting: Endocrinology

## 2017-09-27 MED ORDER — INSULIN PEN NEEDLE 32G X 5 MM MISC: 0 refills | Status: DC

## 2017-09-28 ENCOUNTER — Telehealth: Payer: Self-pay | Admitting: Nutrition

## 2017-09-28 NOTE — Telephone Encounter (Signed)
Spoke with patient to schedule pump start.  She says that she and her family have looked over the manual and went on line, and said it was "way too much for her".  She wants to send it back.  She says her family thinks it will be too much for her.   I tried to get her to come in to see what all is involved, but she refuses to do this.  She was given the 800 telephone number to call to get it sent backl

## 2017-10-03 NOTE — Telephone Encounter (Signed)
Called spoke with patient who reported that Dr Lesli AlbeeScott Lee has already gotten the EPT tilt [facility] and she just wanted to know if there was a place in [place] so she wouldn't have to wait so long for appt.  Per patient, nothing further is needed and she appreciates our assistance.  Will sign off.

## 2017-10-03 NOTE — Telephone Encounter (Signed)
Pt is calling back [phone]

## 2017-10-03 NOTE — Telephone Encounter (Signed)
lmtcb x1 for pt. 

## 2017-10-03 NOTE — Telephone Encounter (Signed)
[facility]

## 2017-10-04 ENCOUNTER — Telehealth: Payer: Self-pay

## 2017-10-04 NOTE — Telephone Encounter (Signed)
[facility] for Omnipod Dash 5 pack. Per patients last note she was returning the pump she did not want it. Prior authorization discarded.

## 2017-10-10 ENCOUNTER — Other Ambulatory Visit: Payer: Self-pay

## 2017-10-10 ENCOUNTER — Telehealth: Payer: Self-pay

## 2017-10-10 MED ORDER — LIRAGLUTIDE 18 MG/3ML ~~LOC~~ SOPN: 1.8000 mg | PEN_INJECTOR | Freq: Every day | SUBCUTANEOUS | 3 refills | Status: DC

## 2017-10-10 MED ORDER — INSULIN LISPRO 100 UNIT/ML ~~LOC~~ SOLN: SUBCUTANEOUS | 3 refills | Status: DC

## 2017-10-10 MED ORDER — V-GO 40 KIT: PACK | 3 refills | Status: DC

## 2017-10-10 NOTE — Telephone Encounter (Signed)
She was supposed to switch Victoza to Ozempic.  Also need to know what her morning sugars are and if they are consistently over 150 needs to start injection of Lantus also 6 units at night

## 2017-10-10 NOTE — Telephone Encounter (Signed)
Patient called today and is declining to use omnipod and wants to go back to using V-Go 40 I am sending in refills for humalog, victoza, and the V-Go 40- just an [place]

## 2017-10-12 NOTE — Telephone Encounter (Signed)
LMTCB

## 2017-10-12 NOTE — Telephone Encounter (Signed)
She will go back to 1.8 mg Victoza and will review her level of control next week in the office as scheduled

## 2017-10-12 NOTE — Telephone Encounter (Signed)
Pt is aware.  

## 2017-10-12 NOTE — Telephone Encounter (Signed)
Pt stated that she stopped Ozempic 0.5mg  because it is not working and her sugars are very high, please advise if going back on Victoza is ok or if you would like to make a change?

## 2017-10-17 ENCOUNTER — Other Ambulatory Visit (INDEPENDENT_AMBULATORY_CARE_PROVIDER_SITE_OTHER): Payer: Medicare Other

## 2017-10-17 DIAGNOSIS — E063 Autoimmune thyroiditis: Secondary | ICD-10-CM | POA: Diagnosis not present

## 2017-10-17 DIAGNOSIS — Z794 Long term (current) use of insulin: Secondary | ICD-10-CM

## 2017-10-17 DIAGNOSIS — E1165 Type 2 diabetes mellitus with hyperglycemia: Secondary | ICD-10-CM

## 2017-10-17 LAB — BASIC METABOLIC PANEL
BUN: 17 mg/dL (ref 6–23)
CO2: 30 mEq/L (ref 19–32)
Calcium: 9.9 mg/dL (ref 8.4–10.5)
Chloride: 99 mEq/L (ref 96–112)
Creatinine, Ser: 0.88 mg/dL (ref 0.40–1.20)
GFR: 66.32 mL/min (ref >60.00)
GLUCOSE: 160 mg/dL — AB (ref 70–99)
POTASSIUM: 4.2 meq/L (ref 3.5–5.1)
SODIUM: 138 meq/L (ref 135–145)

## 2017-10-17 LAB — TSH: TSH: 0.71 u[IU]/mL (ref 0.35–4.50)

## 2017-10-18 LAB — FRUCTOSAMINE: Fructosamine: 259 umol/L (ref 0–285)

## 2017-10-19 ENCOUNTER — Ambulatory Visit (INDEPENDENT_AMBULATORY_CARE_PROVIDER_SITE_OTHER): Payer: Medicare Other | Admitting: Endocrinology

## 2017-10-19 ENCOUNTER — Encounter: Payer: Self-pay | Admitting: Endocrinology

## 2017-10-19 VITALS — BP 126/72 | HR 82 | Ht 63.0 in | Wt 214.0 lb

## 2017-10-19 DIAGNOSIS — E1165 Type 2 diabetes mellitus with hyperglycemia: Secondary | ICD-10-CM | POA: Diagnosis not present

## 2017-10-19 DIAGNOSIS — E063 Autoimmune thyroiditis: Secondary | ICD-10-CM | POA: Diagnosis not present

## 2017-10-19 DIAGNOSIS — Z794 Long term (current) use of insulin: Secondary | ICD-10-CM | POA: Diagnosis not present

## 2017-10-19 NOTE — Patient Instructions (Signed)
[name]  insulin: This insulin provides blood sugar control for up to 24 hours. .  Start with 8 units at bedtime daily and increase by 2 units every 3 days until the waking up sugars are under 130. Then continue the same dose.  If blood sugar is under 90 for 2 days in a row, reduce the dose by 2 units.  Note that this insulin does not control the rise of blood sugar with meals    5 clicks at [facility]

## 2017-10-19 NOTE — Progress Notes (Signed)
Patient ID: [name], female   DOB: Jul 22, 1940, 77 y.o.   MRN: [id number]    Reason for Appointment: Followup for Type 2 Diabetes    History of Present Illness:          Diagnosis: Type 2 diabetes mellitus, date of diagnosis:2011       Past history: Since she was apparently intolerant to metformin she was treated with Onglyza until about 2014  At that time because of increasing A1c of about 8% she was started on Levemir 15 units and this was aggressively increased Onglyza was continued She has not tried any other treatments for diabetes Her A1c has been 10-10.5 in 2015 In 3/15 she was told to start small doses of NovoLog at lunch and supper and add 15 units of Levemir at night also To control  hyperglycemia with her basal bolus insulin regimen she was given Victoza in addition on her initial consultation in 5/15 She has been on Victoza since 8/15  She has been on the V-go pump since 09/2014  Recent history:   INSULIN regimen :  V-go pump 40 unit basal, boluses at meal times: 12-27-12 units    Noninsulin hypoglycemic drugs the patient is taking are: Invokana 300 mg, on VICTOZA 1.8 mg   Her A1c is last 7.4 However fructosamine is again relatively low at 252  Current management, blood sugar patterns and problems:  She was told to try Ozempic to help her control her excessive snacking but she claims that even with 0.5 mg weekly her sugars were relatively high and she did not want to continue this  She was also told to try TRESIBA to keep her morning sugars down but even though she did not increase the dose above 6 units she does not think her sugars are better and she stopped it on her own  She thinks that she is still getting hungry late at night and will eat a bowl of cereal; she thinks she is coming this with 3 or 4 clicks  However not clear how she is getting extra clicks for her snack since she is probably using of her 18 clicks during the day  She was  also told to try the OMNIPOD insulin pump but she thinks this is too complicated and does not even want to try learning this  FASTING blood sugars are consistently high although rarely may have a better reading as low as 95 early morning  She checks blood sugars only sporadically at a later in the day and has at least 2 or 3 readings over 200 but otherwise mostly about 170-190  She thinks she is taking her V-go pump consistently at 2 PM daily  Although she has not checked readings AFTER meals much recently she thinks her diet has been inconsistent in September because of frequent birthday parties   Meals: 3 meals per day. Bfst oatmeal or egg or cereal at 9 am, dinner 6-7 pm.; no hs snacks   Side effects from medications have been: Metformin: rash  Glucose monitoring:  done 1 time a day or less, mostly in the morning       Glucometer:  One Touch.      Blood Glucose readings from home glucose meter download:    PRE-MEAL Fasting Lunch Dinner Bedtime Overall  Glucose range:  128-207   114-238  174, 159   Mean/median:  180    174   POST-MEAL PC Breakfast PC Lunch  overnight  Glucose range:  95-169  Mean/median:      Previous readings:  PRE-MEAL Fasting Lunch Dinner Bedtime Overall  Glucose range:  123-204  150-218   202   Mean/median: 164  162   162   POST-MEAL PC Breakfast PC Lunch PC Dinner  Glucose range:   130-183   Mean/median:        Glycemic control:    Lab Results  Component Value Date   HGBA1C 7.4 (H) 08/24/2017   HGBA1C 6.2 (H) 06/23/2017   HGBA1C 7.6 (H) 05/20/2017   Lab Results  Component Value Date   MICROALBUR <0.7 05/26/2017   LDLCALC 73 03/26/2017   CREATININE 0.88 10/17/2017    Self-care: The diet that the patient has been following is: tries to limit fats   Breakfast: She will add [place] bacon or sausage for protein, usually getting eggs also           Exercise: Walks 20 min 3-4/7  days     Dietician visit: Most recent: 2/19   CDE visit:  9/16     Compliance with the medical regimen: Inconsistent   Weight history:  Wt Readings from Last 3 Encounters:  10/19/17 214 lb (97.1 kg)  09/09/17 214 lb (97.1 kg)  08/29/17 215 lb (97.5 kg)    Other active problems: See review of systems    Allergies as of 10/19/2017      Reactions   Flagyl [metronidazole Hcl] Itching, Rash   Ciprofloxacin Itching, Rash   Metformin And Related Rash   Milk-related Compounds Other (See Comments)   Stomach pains   Other Other (See Comments)   [place] nuts cause severe facial redness      Medication List        Accurate as of 10/19/17  1:27 PM. Always use your most recent med list.          aspirin 81 MG EC tablet Take 1 tablet (81 mg total) by mouth daily.   azithromycin 250 MG tablet Commonly known as:  ZITHROMAX Take by mouth daily.   betamethasone dipropionate 0.05 % cream Commonly known as:  DIPROLENE Apply 1 application topically 2 (two) times daily as needed (rash).   carbamazepine 200 MG 12 hr capsule Commonly known as:  CARBATROL Take 200 mg by mouth 2 (two) times daily.   clobetasol 0.05 % Gel Commonly known as:  TEMOVATE Apply 1 application topically 2 (two) times daily as needed (rash).   diltiazem 360 MG 24 hr capsule Commonly known as:  CARDIZEM CD Take 1 capsule (360 mg total) by mouth at bedtime.   donepezil 10 MG tablet Commonly known as:  ARICEPT Take 10 mg by mouth at bedtime.   esomeprazole 40 MG capsule Commonly known as:  NEXIUM Take 1 capsule (40 mg total) by mouth daily at 12 noon.   folic acid 1 MG tablet Commonly known as:  FOLVITE Take 1 mg by mouth daily.   furosemide 40 MG tablet Commonly known as:  LASIX Take 1 tablet (40 mg total) by mouth daily.   gabapentin 300 MG capsule Commonly known as:  NEURONTIN Take 300 mg by mouth 2 (two) times daily.   insulin degludec 100 UNIT/ML Sopn FlexTouch Pen Commonly known as:  TRESIBA Inject 0.06 mLs (6 Units total) into the skin daily.     insulin lispro 100 UNIT/ML injection Commonly known as:  HUMALOG Use 32 units daily in V-Go pump   Insulin Pen Needle 33G X 5 MM Misc 1 each by Does not apply  route daily. Use with Tresiba pen   Insulin Pen Needle 32G X 5 MM Misc 1 each by Does not apply route daily. Use with Tresiba pen   INVOKANA 300 MG Tabs tablet Generic drug:  canagliflozin TAKE ONE TABLET BY MOUTH DAILY BEFORE BREAKFAST   levETIRAcetam 500 MG tablet Commonly known as:  KEPPRA Take by mouth.   levothyroxine 75 MCG tablet Commonly known as:  SYNTHROID, LEVOTHROID Take 75 mcg by mouth daily before breakfast.   liraglutide 18 MG/3ML Sopn Commonly known as:  VICTOZA Inject 0.3 mLs (1.8 mg total) into the skin daily.   methotrexate 2.5 MG tablet Commonly known as:  RHEUMATREX Take 7.5 mg by mouth every Monday. For rash   metoprolol tartrate 25 MG tablet Commonly known as:  LOPRESSOR Take 1 tablet (25 mg total) by mouth 2 (two) times daily.   OLANZapine 5 MG tablet Commonly known as:  ZYPREXA Take 5 mg by mouth at bedtime.   ondansetron 8 MG tablet Commonly known as:  ZOFRAN Take 1 tablet (8 mg total) by mouth every 8 (eight) hours as needed for nausea.   ONETOUCH DELICA LANCETS 67R Misc USE LANCET TO TEST FIVE TIMES A DAY   OZEMPIC (0.25 OR 0.5 MG/DOSE) 2 MG/1.5ML Sopn Generic drug:  Semaglutide(0.25 or 0.5MG/DOS) DIAL AND INJECT SUBCUTANEOUSLY 0.5 MG WEEKLY   pantoprazole 40 MG tablet Commonly known as:  PROTONIX Take 1 tablet (40 mg total) by mouth daily.   pravastatin 40 MG tablet Commonly known as:  PRAVACHOL Take 1 tablet (40 mg total) by mouth daily.   predniSONE 10 MG (21) Tbpk tablet Commonly known as:  STERAPRED UNI-PAK 21 TAB Take by mouth daily.   V-GO 40 Kit USE ONCE DAILY AS DIRECTED       Allergies:  Allergies  Allergen Reactions  . Flagyl [Metronidazole Hcl] Itching and Rash  . Ciprofloxacin Itching and Rash  . Metformin And Related Rash  . Milk-Related  Compounds Other (See Comments)    Stomach pains  . Other Other (See Comments)    [place] nuts cause severe facial redness    Past Medical History:  Diagnosis Date  . Bipolar disorder ([place])   . Depression    Bipolar  . GERD (gastroesophageal reflux disease)   . History of hiatal hernia   . History of kidney stones   . History of stomach ulcers 1970s   "bleeding"  . Hyperlipidemia   . Hypertension   . Hypothyroidism   . Ischemic colitis, enteritis, or enterocolitis ([place])   . OSA on CPAP   . Pericarditis 05/06/2017  . Pneumonia    "several times" (06/23/2017)  . Thyroid disease   . TIA (transient ischemic attack) 2011 X 2  . Type II diabetes mellitus ([place])   . Urinary bladder incontinence   . Vertigo     Past Surgical History:  Procedure Laterality Date  . APPENDECTOMY    . CARDIAC CATHETERIZATION N/A 12/24/2014   Procedure: Right/Left Heart Cath and Coronary Angiography;  Surgeon: Sanda Klein, MD;  Location: [facility];  Service: Cardiovascular;  Laterality: N/A;  . CATARACT EXTRACTION W/ INTRAOCULAR LENS  IMPLANT, BILATERAL Bilateral   . EXCISIONAL HEMORRHOIDECTOMY    . EYE SURGERY    . FRACTURE SURGERY    . IR THORACENTESIS ASP PLEURAL SPACE W/IMG GUIDE  05/13/2017  . LAPAROSCOPIC CHOLECYSTECTOMY    . LITHOTRIPSY  "several times"  . RETINAL DETACHMENT SURGERY     "think it was on the left; not  sure" (06/23/2017)  . TONSILLECTOMY    . VAGINAL HYSTERECTOMY     partial     Family History  Problem Relation Age of Onset  . Heart disease Mother   . Stroke Father   . Parkinson's disease Father   . Cancer Sister   . Cancer Brother     Social History:  reports that she has never smoked. She has never used smokeless tobacco. She reports that she does not drink alcohol or use drugs.    Review of Systems   She has had less edema and takes Lasix regularly now  NEUROPATHY: She has lower leg pain and takes gabapentin twice a day for this, however does not have  any symptoms      Lipids: Last LDL from PCP was 101, followed by PCP also        Lab Results  Component Value Date   CHOL 149 03/26/2017   HDL 50 03/26/2017   LDLCALC 73 03/26/2017   TRIG 130 03/26/2017   CHOLHDL 3.0 03/26/2017       Thyroid:   Has had presumed hypothyroidism for over 20 years, has not had a change in medications in several years and last TSH was 1.9 in August from PCP   Lab Results  Component Value Date   TSH 0.71 10/17/2017   TSH 1.495 06/23/2017   TSH 1.78 05/20/2017   FREET4 1.04 05/05/2017   FREET4 0.86 01/31/2014   FREET4 0.91 08/28/2013       The blood pressure has been controlled with a combination of amlodipine and Benicar as well as hydralazine and bisoprolol      Physical Examination:  BP 126/72   Pulse 82   Ht _0  (1.6 m)   Wt 214 lb (97.1 kg)   SpO2 98%   BMI 37.91 kg/m      ASSESSMENT/PLAN:   Diabetes type 2, uncontrolled   See history of present illness for detailed discussion of his current management, blood sugar patterns and problems identified  Her blood sugars are Looking better now compared to her last visit This has improved mostly with her trying to change her V-go pump consistently around the same time every evening However not checking enough readings after meals and has only sporadic readings lately Blood sugars maybe higher with eating cereal in the morning and discussed balancing all meals with some protein and not excessive carbohydrate  Other recommendations: She needs to check more readings after supper Also since she thinks she is eating more at lunch she can take about 12 units of cover her lunch and also reduce suppertime dose down to 10-12 units based on her meal size since her readings at night are about 100 She will let us know if she has more variability in her blood sugars but for now will continue the same regimen Also she can try and adjust her doses further with better postprandial  monitoring  Follow-up in 3 months again   There are no Patient Instructions on file for this visit.   Elayne Snare 10/19/2017, 1:27 PM   Note: This office note was prepared with Dragon voice recognition system technology. Any transcriptional errors that result from this process are unintentional.                     Patient ID: [name], female   DOB: [birth year], 77 y.o.   MRN: [id number]    Reason for Appointment: Followup for Type 2 Diabetes and swelling  of legs  History of Present Illness:          Diagnosis: Type 2 diabetes mellitus, date of diagnosis:2011       Past history: Since she was apparently intolerant to metformin she was treated with Onglyza until about 2014  At that time because of increasing A1c of about 8% she was started on Levemir 15 units and this was aggressively increased Onglyza was continued She has not tried any other treatments for diabetes Her A1c has been 10-10.5 in 2015 In 3/15 she was told to start small doses of NovoLog at lunch and supper and add 15 units of Levemir at night also To control  hyperglycemia with her basal bolus insulin regimen she was given Victoza in addition on her initial consultation in 5/15  Recent history:   INSULIN regimen :  V-go pump 40 unit basal, boluses at meal times: 5-6-8     Oral hypoglycemic drugs the patient is taking are: None  She was changed on 09/19/14 to the V-go pump instead of basal bolus insulin shots She has been on Victoza since 8/15 and she is taking 1.2 mg  With the V-go pump her blood sugars have improved overall and are less variable  Current blood sugar patterns and problems:  Fasting blood sugars are very consistent, usually just above 100  She has stopped checking her blood sugars later in the day even though she was told to vary the times when she checks her sugar  She has no difficulty remembering to do her boluses with meals but usually does not change the amounts  She  has improved her diet and is trying to  eliminate high glycemic index foods and juices  Recently trying to walk more regularly also  No hypoglycemia  She has no difficulty with using the V-go pump, doing the boluses when she is eating out and changing it every morning      Side effects from medications have been: Metformin: rash  Glucose monitoring:  done 1 time a day         Glucometer:  One Touch.      Blood Glucose readings from home glucose meter download:  FASTING readings recently: 90-147, usually under 130.  Her monitor has incorrect date and time programmed   Glycemic control:    Lab Results  Component Value Date   HGBA1C 7.4 (H) 08/24/2017   HGBA1C 6.2 (H) 06/23/2017   HGBA1C 7.6 (H) 05/20/2017   Lab Results  Component Value Date   MICROALBUR <0.7 05/26/2017   LDLCALC 73 03/26/2017   CREATININE 0.88 10/17/2017    Self-care: The diet that the patient has been following is: tries to limit fats     Meals: 3 meals per day. Bfst oatmeal or egg/toast at 8-9 am, dinner 5 pm cutting back on frequent meals and  portions; no hs snacks           Exercise: Walks 15-20 min       Dietician visit: Most recent: 2014 ( class).               Compliance with the medical regimen: Good  Retinal exam: Most recent:.9/14    Weight history:  Wt Readings from Last 3 Encounters:  10/19/17 214 lb (97.1 kg)  09/09/17 214 lb (97.1 kg)  08/29/17 215 lb (97.5 kg)    Allergies as of 10/19/2017      Reactions   Flagyl [metronidazole Hcl] Itching, Rash   Ciprofloxacin Itching, Rash  Metformin And Related Rash   Milk-related Compounds Other (See Comments)   Stomach pains   Other Other (See Comments)   [place] nuts cause severe facial redness      Medication List        Accurate as of 10/19/17  1:27 PM. Always use your most recent med list.          aspirin 81 MG EC tablet Take 1 tablet (81 mg total) by mouth daily.   azithromycin 250 MG tablet Commonly known as:   ZITHROMAX Take by mouth daily.   betamethasone dipropionate 0.05 % cream Commonly known as:  DIPROLENE Apply 1 application topically 2 (two) times daily as needed (rash).   carbamazepine 200 MG 12 hr capsule Commonly known as:  CARBATROL Take 200 mg by mouth 2 (two) times daily.   clobetasol 0.05 % Gel Commonly known as:  TEMOVATE Apply 1 application topically 2 (two) times daily as needed (rash).   diltiazem 360 MG 24 hr capsule Commonly known as:  CARDIZEM CD Take 1 capsule (360 mg total) by mouth at bedtime.   donepezil 10 MG tablet Commonly known as:  ARICEPT Take 10 mg by mouth at bedtime.   esomeprazole 40 MG capsule Commonly known as:  NEXIUM Take 1 capsule (40 mg total) by mouth daily at 12 noon.   folic acid 1 MG tablet Commonly known as:  FOLVITE Take 1 mg by mouth daily.   furosemide 40 MG tablet Commonly known as:  LASIX Take 1 tablet (40 mg total) by mouth daily.   gabapentin 300 MG capsule Commonly known as:  NEURONTIN Take 300 mg by mouth 2 (two) times daily.   insulin degludec 100 UNIT/ML Sopn FlexTouch Pen Commonly known as:  TRESIBA Inject 0.06 mLs (6 Units total) into the skin daily.   insulin lispro 100 UNIT/ML injection Commonly known as:  HUMALOG Use 32 units daily in V-Go pump   Insulin Pen Needle 33G X 5 MM Misc 1 each by Does not apply route daily. Use with Tresiba pen   Insulin Pen Needle 32G X 5 MM Misc 1 each by Does not apply route daily. Use with Tresiba pen   INVOKANA 300 MG Tabs tablet Generic drug:  canagliflozin TAKE ONE TABLET BY MOUTH DAILY BEFORE BREAKFAST   levETIRAcetam 500 MG tablet Commonly known as:  KEPPRA Take by mouth.   levothyroxine 75 MCG tablet Commonly known as:  SYNTHROID, LEVOTHROID Take 75 mcg by mouth daily before breakfast.   liraglutide 18 MG/3ML Sopn Commonly known as:  VICTOZA Inject 0.3 mLs (1.8 mg total) into the skin daily.   methotrexate 2.5 MG tablet Commonly known as:   RHEUMATREX Take 7.5 mg by mouth every Monday. For rash   metoprolol tartrate 25 MG tablet Commonly known as:  LOPRESSOR Take 1 tablet (25 mg total) by mouth 2 (two) times daily.   OLANZapine 5 MG tablet Commonly known as:  ZYPREXA Take 5 mg by mouth at bedtime.   ondansetron 8 MG tablet Commonly known as:  ZOFRAN Take 1 tablet (8 mg total) by mouth every 8 (eight) hours as needed for nausea.   ONETOUCH DELICA LANCETS 16X Misc USE LANCET TO TEST FIVE TIMES A DAY   OZEMPIC (0.25 OR 0.5 MG/DOSE) 2 MG/1.5ML Sopn Generic drug:  Semaglutide(0.25 or 0.5MG/DOS) DIAL AND INJECT SUBCUTANEOUSLY 0.5 MG WEEKLY   pantoprazole 40 MG tablet Commonly known as:  PROTONIX Take 1 tablet (40 mg total) by mouth daily.   pravastatin 40 MG  tablet Commonly known as:  PRAVACHOL Take 1 tablet (40 mg total) by mouth daily.   predniSONE 10 MG (21) Tbpk tablet Commonly known as:  STERAPRED UNI-PAK 21 TAB Take by mouth daily.   V-GO 40 Kit USE ONCE DAILY AS DIRECTED       Allergies:  Allergies  Allergen Reactions  . Flagyl [Metronidazole Hcl] Itching and Rash  . Ciprofloxacin Itching and Rash  . Metformin And Related Rash  . Milk-Related Compounds Other (See Comments)    Stomach pains  . Other Other (See Comments)    [place] nuts cause severe facial redness    Past Medical History:  Diagnosis Date  . Bipolar disorder ([place])   . Depression    Bipolar  . GERD (gastroesophageal reflux disease)   . History of hiatal hernia   . History of kidney stones   . History of stomach ulcers 1970s   "bleeding"  . Hyperlipidemia   . Hypertension   . Hypothyroidism   . Ischemic colitis, enteritis, or enterocolitis ([place])   . OSA on CPAP   . Pericarditis 05/06/2017  . Pneumonia    "several times" (06/23/2017)  . Thyroid disease   . TIA (transient ischemic attack) 2011 X 2  . Type II diabetes mellitus ([place])   . Urinary bladder incontinence   . Vertigo     Past Surgical History:  Procedure  Laterality Date  . APPENDECTOMY    . CARDIAC CATHETERIZATION N/A 12/24/2014   Procedure: Right/Left Heart Cath and Coronary Angiography;  Surgeon: Sanda Klein, MD;  Location: [facility];  Service: Cardiovascular;  Laterality: N/A;  . CATARACT EXTRACTION W/ INTRAOCULAR LENS  IMPLANT, BILATERAL Bilateral   . EXCISIONAL HEMORRHOIDECTOMY    . EYE SURGERY    . FRACTURE SURGERY    . IR THORACENTESIS ASP PLEURAL SPACE W/IMG GUIDE  05/13/2017  . LAPAROSCOPIC CHOLECYSTECTOMY    . LITHOTRIPSY  "several times"  . RETINAL DETACHMENT SURGERY     "think it was on the left; not sure" (06/23/2017)  . TONSILLECTOMY    . VAGINAL HYSTERECTOMY     partial     Family History  Problem Relation Age of Onset  . Heart disease Mother   . Stroke Father   . Parkinson's disease Father   . Cancer Sister   . Cancer Brother     Social History:  reports that she has never smoked. She has never used smokeless tobacco. She reports that she does not drink alcohol or use drugs.    Review of Systems      The blood pressure has been usually controlled with a combination of Bisoprolol, diltiazem  as well as Aldactone.   Benicar previously stopped because of  Diarrhea  No recurrence of hyperkalemia  Lab Results  Component Value Date   CREATININE 0.88 10/17/2017   BUN 17 10/17/2017   NA 138 10/17/2017   K 4.2 10/17/2017   CL 99 10/17/2017   CO2 30 10/17/2017    NEUROPATHY: She has lower leg neuropathic pains and takes gabapentin twice a day  LIPIDS: Controlled with 20 mg pravastatin       Lab Results  Component Value Date   CHOL 149 03/26/2017   HDL 50 03/26/2017   LDLCALC 73 03/26/2017   TRIG 130 03/26/2017   CHOLHDL 3.0 03/26/2017       Thyroid:    Has had hypothyroidism for over 20 years, taking 75 mcg levothyroxine  Last TSH normal  Lab Results  Component  Value Date   TSH 0.71 10/17/2017   TSH 1.495 06/23/2017   TSH 1.78 05/20/2017   FREET4 1.04 05/05/2017   FREET4 0.86  01/31/2014   FREET4 0.91 08/28/2013    Depression: She is on long-term Zyprexa 5 mg daily     Physical Examination:  BP 126/72   Pulse 82   Ht _0  (1.6 m)   Wt 214 lb (97.1 kg)   SpO2 98%   BMI 37.91 kg/m     ASSESSMENT/PLAN:   Diabetes type 2, current BMI 39.5 See history of present illness for detailed discussion of current  management, blood sugar patterns and problems identified.  She is on a regimen of the V-go pump and also Invokana and Victoza  A1c is 7.4%, fructosamine relatively better  However despite her average assessment as above her fasting readings are averaging 180 from inadequate basal insulin May be having some high readings overnight because of bedtime snacks She is opting to try the Omnipod pump which was discussed and she does have this available now  Discussed options for better control  Restart [place] Aas which she has at home  She will need to start with 8 units every day  She was given a flowsheet with review of detailed instructions on how to adjust this every 3 days by 2 units until morning sugars are at least under 140  Once her morning sugars are better she can reduce her bolus at breakfast by 2 units to avoid low sugars at lunch  Continue V-go pump  Continue Victoza  Instead of cereal at bedtime she will have a protein bar or a protein snack  No consistent monitoring after meals  Regular walking  HYPOTHYROIDISM: Adequately controlled  There are no Patient Instructions on file for this visit.  Counseling time on subjects discussed in assessment and plan sections is over 50% of today's 25 minute visit   Elayne Snare 10/19/2017, 1:27 PM   Note: This office note was prepared with Dragon voice recognition system technology. Any transcriptional errors that result from this process are unintentional.

## 2017-10-26 ENCOUNTER — Other Ambulatory Visit: Payer: Self-pay | Admitting: Cardiovascular Disease

## 2017-11-08 ENCOUNTER — Telehealth: Payer: Self-pay | Admitting: Endocrinology

## 2017-11-08 NOTE — Telephone Encounter (Signed)
Patient called re: her medications were changed. Patient wants to know if she should still be taking Invokana. If so, patient requests RX for Invokana be sent to [facility] fax# [phone]. Please call patient at ph# [phone] to advise.

## 2017-11-09 NOTE — Telephone Encounter (Signed)
Spoke with patient about continuing Inokana, please refill at [phone].

## 2017-11-09 NOTE — Telephone Encounter (Signed)
[email or web address]

## 2017-11-09 NOTE — Telephone Encounter (Signed)
He does not mention this in his note  -I am not sure he is aware she is taking Invokana. I would suggest to continue it for now, based on the sugars.

## 2017-11-10 ENCOUNTER — Other Ambulatory Visit: Payer: Self-pay

## 2017-11-10 MED ORDER — CANAGLIFLOZIN 300 MG PO TABS: ORAL_TABLET | ORAL | 0 refills | Status: DC

## 2017-12-05 ENCOUNTER — Telehealth: Payer: Self-pay | Admitting: Nutrition

## 2017-12-05 NOTE — Telephone Encounter (Signed)
Message left on machine to call me to schedule her pump start

## 2017-12-19 ENCOUNTER — Ambulatory Visit: Payer: Medicare Other

## 2017-12-19 DIAGNOSIS — Z794 Long term (current) use of insulin: Secondary | ICD-10-CM

## 2017-12-19 DIAGNOSIS — E1165 Type 2 diabetes mellitus with hyperglycemia: Secondary | ICD-10-CM

## 2017-12-19 LAB — COMPREHENSIVE METABOLIC PANEL
ALK PHOS: 139 U/L — AB (ref 39–117)
ALT: 19 U/L (ref 0–35)
AST: 14 U/L (ref 0–37)
Albumin: 4.1 g/dL (ref 3.5–5.2)
BILIRUBIN TOTAL: 0.2 mg/dL (ref 0.2–1.2)
BUN: 17 mg/dL (ref 6–23)
CALCIUM: 9.7 mg/dL (ref 8.4–10.5)
CO2: 29 meq/L (ref 19–32)
Chloride: 101 mEq/L (ref 96–112)
Creatinine, Ser: 1.02 mg/dL (ref 0.40–1.20)
GFR: 55.91 mL/min — ABNORMAL LOW (ref >60.00)
Glucose, Bld: 123 mg/dL — ABNORMAL HIGH (ref 70–99)
Potassium: 4.7 mEq/L (ref 3.5–5.1)
Sodium: 139 mEq/L (ref 135–145)
TOTAL PROTEIN: 7.3 g/dL (ref 6.0–8.3)

## 2017-12-19 LAB — HEMOGLOBIN A1C: Hgb A1c MFr Bld: 7.7 % — ABNORMAL HIGH (ref 4.6–6.5)

## 2017-12-22 ENCOUNTER — Encounter (INDEPENDENT_AMBULATORY_CARE_PROVIDER_SITE_OTHER): Payer: Medicare Other | Admitting: Ophthalmology

## 2017-12-22 DIAGNOSIS — H35033 Hypertensive retinopathy, bilateral: Secondary | ICD-10-CM | POA: Diagnosis not present

## 2017-12-22 DIAGNOSIS — E113293 Type 2 diabetes mellitus with mild nonproliferative diabetic retinopathy without macular edema, bilateral: Secondary | ICD-10-CM | POA: Diagnosis not present

## 2017-12-22 DIAGNOSIS — I10 Essential (primary) hypertension: Secondary | ICD-10-CM

## 2017-12-22 DIAGNOSIS — H338 Other retinal detachments: Secondary | ICD-10-CM

## 2017-12-22 DIAGNOSIS — E11319 Type 2 diabetes mellitus with unspecified diabetic retinopathy without macular edema: Secondary | ICD-10-CM | POA: Diagnosis not present

## 2017-12-22 DIAGNOSIS — H43813 Vitreous degeneration, bilateral: Secondary | ICD-10-CM

## 2017-12-23 ENCOUNTER — Ambulatory Visit (INDEPENDENT_AMBULATORY_CARE_PROVIDER_SITE_OTHER): Payer: Medicare Other | Admitting: Endocrinology

## 2017-12-23 ENCOUNTER — Encounter: Payer: Self-pay | Admitting: Endocrinology

## 2017-12-23 VITALS — BP 110/76 | HR 72 | Ht 63.0 in | Wt 221.2 lb

## 2017-12-23 DIAGNOSIS — Z794 Long term (current) use of insulin: Secondary | ICD-10-CM | POA: Diagnosis not present

## 2017-12-23 DIAGNOSIS — E1165 Type 2 diabetes mellitus with hyperglycemia: Secondary | ICD-10-CM | POA: Diagnosis not present

## 2017-12-23 NOTE — Patient Instructions (Addendum)
Take 12 U Insulin   4 clicks at lunch instead of 5

## 2017-12-23 NOTE — Progress Notes (Signed)
Patient ID: [name], female   DOB: August 24, 1940, 77 y.o.   MRN: [id number]    Reason for Appointment: Followup for Type 2 Diabetes    History of Present Illness:          Diagnosis: Type 2 diabetes mellitus, date of diagnosis:2011       Past history: Since she was apparently intolerant to metformin she was treated with Onglyza until about 2014  At that time because of increasing A1c of about 8% she was started on Levemir 15 units and this was aggressively increased Onglyza was continued She has not tried any other treatments for diabetes Her A1c has been 10-10.5 in 2015 In 3/15 she was told to start small doses of NovoLog at lunch and supper and add 15 units of Levemir at night also To control  hyperglycemia with her basal bolus insulin regimen she was given Victoza in addition on her initial consultation in 5/15 She has been on Victoza since 8/15  She has been on the V-go pump since 09/2014  Recent history:   INSULIN regimen :  V-go pump 40 unit basal, boluses at meal times: 12-27-12 units TRESIBA 10 units daily   Noninsulin hypoglycemic drugs the patient is taking are: Invokana 300 mg, on VICTOZA 1.8 mg   Her A1c is 7.7, last time was 7.4   Current management, blood sugar patterns and problems:  She was told to keep increasing her Tyler Aas until her morning sugar came down to 130 but she has increased it only 2 units  Her blood sugars are a little better in the morning but quite variable  She has somewhat better readings during the night if she checks the readings between 2-5 AM and may have a dawn phenomena  Sugars are relatively lower at lunchtime  She thinks she feels hypoglycemic if her blood sugar is in the 80s or below and this is happened occasionally late afternoon  Has gained weight  She thinks that she is very regular with taking her Invokana, Victoza as well as changing her pump on time as well as doing her insulin clicks on her pump  consistently  Not clear why her blood sugars are fluctuating  She does have difficulty sleeping at night  Again she does not want to use the OmniPod pump even though she was approved because the electronics involved   Meals: 3 meals per day. Bfst oatmeal or egg or cereal at 9 am, dinner 6-7 pm.; no hs snacks   Side effects from medications have been: Metformin: rash  Glucose monitoring:  done 1 time a day or less, mostly in the morning       Glucometer:  One Touch.      Blood Glucose readings from home glucose meter download:    PRE-MEAL Fasting Lunch Dinner Bedtime Overall  Glucose range: 151-259  125-237    70-259  Mean/median:  166   146  160 +/-48   POST-MEAL PC Breakfast PC Lunch PC Dinner  Glucose range:   70-188  196, 254  Mean/median:      Previous readings:  PRE-MEAL Fasting Lunch Dinner Bedtime Overall  Glucose range:  128-207   114-238  174, 159   Mean/median:  180    174   POST-MEAL PC Breakfast PC Lunch  overnight  Glucose range:    95-169  Mean/median:        Glycemic control:    Lab Results  Component Value Date   HGBA1C 7.7 (  H) 12/19/2017   HGBA1C 7.4 (H) 08/24/2017   HGBA1C 6.2 (H) 06/23/2017   Lab Results  Component Value Date   MICROALBUR <0.7 05/26/2017   LDLCALC 73 03/26/2017   CREATININE 1.02 12/19/2017    Self-care: The diet that the patient has been following is: tries to limit fats   Breakfast: She will add [place] bacon or sausage for protein, usually getting eggs also           Exercise: Walks 20 min 2-4/7  days     Dietician visit: Most recent: 2/19   CDE visit: 9/16    Weight history:  Wt Readings from Last 3 Encounters:  12/23/17 221 lb 3.2 oz (100.3 kg)  10/19/17 214 lb (97.1 kg)  09/09/17 214 lb (97.1 kg)    Other active problems: See review of systems    Allergies as of 12/23/2017      Reactions   Flagyl [metronidazole Hcl] Itching, Rash   Ciprofloxacin Itching, Rash   Metformin And Related Rash    Milk-related Compounds Other (See Comments)   Stomach pains   Other Other (See Comments)   [place] nuts cause severe facial redness      Medication List        Accurate as of 12/23/17 10:07 AM. Always use your most recent med list.          aspirin 81 MG EC tablet Take 1 tablet (81 mg total) by mouth daily.   betamethasone dipropionate 0.05 % cream Commonly known as:  DIPROLENE Apply 1 application topically 2 (two) times daily as needed (rash).   canagliflozin 300 MG Tabs tablet Commonly known as:  INVOKANA TAKE ONE TABLET BY MOUTH DAILY BEFORE BREAKFAST   carbamazepine 200 MG 12 hr capsule Commonly known as:  CARBATROL Take 200 mg by mouth 2 (two) times daily.   clobetasol 0.05 % Gel Commonly known as:  TEMOVATE Apply 1 application topically 2 (two) times daily as needed (rash).   diltiazem 360 MG 24 hr capsule Commonly known as:  CARDIZEM CD Take 1 capsule (360 mg total) by mouth at bedtime.   esomeprazole 40 MG capsule Commonly known as:  NEXIUM Take 1 capsule (40 mg total) by mouth daily at 12 noon.   folic acid 1 MG tablet Commonly known as:  FOLVITE Take 1 mg by mouth daily.   furosemide 40 MG tablet Commonly known as:  LASIX TAKE ONE TABLET BY MOUTH DAILY   insulin degludec 100 UNIT/ML Sopn FlexTouch Pen Commonly known as:  TRESIBA Inject 0.06 mLs (6 Units total) into the skin daily.   insulin lispro 100 UNIT/ML injection Commonly known as:  HUMALOG Use 32 units daily in V-Go pump   Insulin Pen Needle 33G X 5 MM Misc 1 each by Does not apply route daily. Use with Tresiba pen   Insulin Pen Needle 32G X 5 MM Misc 1 each by Does not apply route daily. Use with Tyler Aas pen   levothyroxine 75 MCG tablet Commonly known as:  SYNTHROID, LEVOTHROID Take 75 mcg by mouth daily before breakfast.   liraglutide 18 MG/3ML Sopn Commonly known as:  VICTOZA Inject 0.3 mLs (1.8 mg total) into the skin daily.   methotrexate 2.5 MG tablet Commonly known as:   RHEUMATREX Take 7.5 mg by mouth every Monday. For rash   metoprolol tartrate 25 MG tablet Commonly known as:  LOPRESSOR Take 1 tablet (25 mg total) by mouth 2 (two) times daily.   OLANZapine 5 MG tablet Commonly known  as:  ZYPREXA Take 5 mg by mouth at bedtime.   ondansetron 8 MG tablet Commonly known as:  ZOFRAN Take 1 tablet (8 mg total) by mouth every 8 (eight) hours as needed for nausea.   ONETOUCH DELICA LANCETS 01U Misc USE LANCET TO TEST FIVE TIMES A DAY   pravastatin 40 MG tablet Commonly known as:  PRAVACHOL Take 1 tablet (40 mg total) by mouth daily.   V-GO 40 Kit USE ONCE DAILY AS DIRECTED       Allergies:  Allergies  Allergen Reactions  . Flagyl [Metronidazole Hcl] Itching and Rash  . Ciprofloxacin Itching and Rash  . Metformin And Related Rash  . Milk-Related Compounds Other (See Comments)    Stomach pains  . Other Other (See Comments)    [place] nuts cause severe facial redness    Past Medical History:  Diagnosis Date  . Bipolar disorder ([place])   . Depression    Bipolar  . GERD (gastroesophageal reflux disease)   . History of hiatal hernia   . History of kidney stones   . History of stomach ulcers 1970s   "bleeding"  . Hyperlipidemia   . Hypertension   . Hypothyroidism   . Ischemic colitis, enteritis, or enterocolitis ([place])   . OSA on CPAP   . Pericarditis 05/06/2017  . Pneumonia    "several times" (06/23/2017)  . Thyroid disease   . TIA (transient ischemic attack) 2011 X 2  . Type II diabetes mellitus ([place])   . Urinary bladder incontinence   . Vertigo     Past Surgical History:  Procedure Laterality Date  . APPENDECTOMY    . CARDIAC CATHETERIZATION N/A 12/24/2014   Procedure: Right/Left Heart Cath and Coronary Angiography;  Surgeon: Sanda Klein, MD;  Location: [facility];  Service: Cardiovascular;  Laterality: N/A;  . CATARACT EXTRACTION W/ INTRAOCULAR LENS  IMPLANT, BILATERAL Bilateral   . EXCISIONAL HEMORRHOIDECTOMY    .  EYE SURGERY    . FRACTURE SURGERY    . IR THORACENTESIS ASP PLEURAL SPACE W/IMG GUIDE  05/13/2017  . LAPAROSCOPIC CHOLECYSTECTOMY    . LITHOTRIPSY  "several times"  . RETINAL DETACHMENT SURGERY     "think it was on the left; not sure" (06/23/2017)  . TONSILLECTOMY    . VAGINAL HYSTERECTOMY     partial     Family History  Problem Relation Age of Onset  . Heart disease Mother   . Stroke Father   . Parkinson's disease Father   . Cancer Sister   . Cancer Brother     Social History:  reports that she has never smoked. She has never used smokeless tobacco. She reports that she does not drink alcohol or use drugs.    Review of Systems   She has had less edema and takes Lasix regularly now  NEUROPATHY: She has lower leg pain and takes gabapentin twice a day for this, however does not have any symptoms      Lipids: Last LDL from PCP was 101, followed by PCP also        Lab Results  Component Value Date   CHOL 149 03/26/2017   HDL 50 03/26/2017   LDLCALC 73 03/26/2017   TRIG 130 03/26/2017   CHOLHDL 3.0 03/26/2017       Thyroid:   Has had presumed hypothyroidism for over 20 years, has not had a change in medications in several years and last TSH was 1.9 in August from PCP   Lab Results  Component Value Date   TSH 0.71 10/17/2017   TSH 1.495 06/23/2017   TSH 1.78 05/20/2017   FREET4 1.04 05/05/2017   FREET4 0.86 01/31/2014   FREET4 0.91 08/28/2013       The blood pressure has been controlled with a combination of amlodipine and Benicar as well as hydralazine and bisoprolol      Physical Examination:  BP 110/76 (BP Location: Left Arm, Patient Position: Sitting, Cuff Size: Normal)   Pulse 72   Ht 5' 3" (1.6 m)   Wt 221 lb 3.2 oz (100.3 kg)   SpO2 96%   BMI 39.18 kg/m      ASSESSMENT/PLAN:   Diabetes type 2, uncontrolled   See history of present illness for detailed discussion of his current management, blood sugar patterns and problems identified  Her blood  sugars are Looking better now compared to her last visit This has improved mostly with her trying to change her V-go pump consistently around the same time every evening However not checking enough readings after meals and has only sporadic readings lately Blood sugars maybe higher with eating cereal in the morning and discussed balancing all meals with some protein and not excessive carbohydrate  Other recommendations: She needs to check more readings after supper Also since she thinks she is eating more at lunch she can take about 12 units of cover her lunch and also reduce suppertime dose down to 10-12 units based on her meal size since her readings at night are about 100 She will let us know if she has more variability in her blood sugars but for now will continue the same regimen Also she can try and adjust her doses further with better postprandial monitoring  Follow-up in 3 months again   Patient Instructions  Take 12 U Insulin   4 clicks at lunch instead of 5         12/23/2017, 10:07 AM   Note: This office note was prepared with Dragon voice recognition system technology. Any transcriptional errors that result from this process are unintentional.                     Patient ID: [name], female   DOB: Mar 23, 1940, 77 y.o.   MRN: [id number]    Reason for Appointment: Followup for Type 2 Diabetes and swelling of legs  History of Present Illness:          Diagnosis: Type 2 diabetes mellitus, date of diagnosis:2011       Past history: Since she was apparently intolerant to metformin she was treated with Onglyza until about 2014  At that time because of increasing A1c of about 8% she was started on Levemir 15 units and this was aggressively increased Onglyza was continued She has not tried any other treatments for diabetes Her A1c has been 10-10.5 in 2015 In 3/15 she was told to start small doses of NovoLog at lunch and supper and add 15 units  of Levemir at night also To control  hyperglycemia with her basal bolus insulin regimen she was given Victoza in addition on her initial consultation in 5/15  Recent history:   INSULIN regimen :  V-go pump 40 unit basal, boluses at meal times: 5-6-8     Oral hypoglycemic drugs the patient is taking are: None  She was changed on 09/19/14 to the V-go pump instead of basal bolus insulin shots She has been on Victoza since 8/15 and she is taking 1.2 mg  With the V-go pump her blood sugars have improved overall and are less variable  Current blood sugar patterns and problems:  Fasting blood sugars are very consistent, usually just above 100  She has stopped checking her blood sugars later in the day even though she was told to vary the times when she checks her sugar  She has no difficulty remembering to do her boluses with meals but usually does not change the amounts  She has improved her diet and is trying to  eliminate high glycemic index foods and juices  Recently trying to walk more regularly also  No hypoglycemia  She has no difficulty with using the V-go pump, doing the boluses when she is eating out and changing it every morning      Side effects from medications have been: Metformin: rash  Glucose monitoring:  done 1 time a day         Glucometer:  One Touch.      Blood Glucose readings from home glucose meter download:  FASTING readings recently: 90-147, usually under 130.  Her monitor has incorrect date and time programmed   Glycemic control:    Lab Results  Component Value Date   HGBA1C 7.7 (H) 12/19/2017   HGBA1C 7.4 (H) 08/24/2017   HGBA1C 6.2 (H) 06/23/2017   Lab Results  Component Value Date   MICROALBUR <0.7 05/26/2017   LDLCALC 73 03/26/2017   CREATININE 1.02 12/19/2017    Self-care: The diet that the patient has been following is: tries to limit fats     Meals: 3 meals per day. Bfst oatmeal or egg/toast at 8-9 am, dinner 5 pm cutting back on  frequent meals and  portions; no hs snacks           Exercise: Walks 15-20 min       Dietician visit: Most recent: 2014 ( class).               Compliance with the medical regimen: Good  Retinal exam: Most recent:.9/14    Weight history:  Wt Readings from Last 3 Encounters:  12/23/17 221 lb 3.2 oz (100.3 kg)  10/19/17 214 lb (97.1 kg)  09/09/17 214 lb (97.1 kg)    Allergies as of 12/23/2017      Reactions   Flagyl [metronidazole Hcl] Itching, Rash   Ciprofloxacin Itching, Rash   Metformin And Related Rash   Milk-related Compounds Other (See Comments)   Stomach pains   Other Other (See Comments)   [place] nuts cause severe facial redness      Medication List        Accurate as of 12/23/17 10:07 AM. Always use your most recent med list.          aspirin 81 MG EC tablet Take 1 tablet (81 mg total) by mouth daily.   betamethasone dipropionate 0.05 % cream Commonly known as:  DIPROLENE Apply 1 application topically 2 (two) times daily as needed (rash).   canagliflozin 300 MG Tabs tablet Commonly known as:  INVOKANA TAKE ONE TABLET BY MOUTH DAILY BEFORE BREAKFAST   carbamazepine 200 MG 12 hr capsule Commonly known as:  CARBATROL Take 200 mg by mouth 2 (two) times daily.   clobetasol 0.05 % Gel Commonly known as:  TEMOVATE Apply 1 application topically 2 (two) times daily as needed (rash).   diltiazem 360 MG 24 hr capsule Commonly known as:  CARDIZEM CD Take 1 capsule (360 mg total) by mouth at bedtime.   esomeprazole 40  MG capsule Commonly known as:  NEXIUM Take 1 capsule (40 mg total) by mouth daily at 12 noon.   folic acid 1 MG tablet Commonly known as:  FOLVITE Take 1 mg by mouth daily.   furosemide 40 MG tablet Commonly known as:  LASIX TAKE ONE TABLET BY MOUTH DAILY   insulin degludec 100 UNIT/ML Sopn FlexTouch Pen Commonly known as:  TRESIBA Inject 0.06 mLs (6 Units total) into the skin daily.   insulin lispro 100 UNIT/ML injection Commonly  known as:  HUMALOG Use 32 units daily in V-Go pump   Insulin Pen Needle 33G X 5 MM Misc 1 each by Does not apply route daily. Use with Tresiba pen   Insulin Pen Needle 32G X 5 MM Misc 1 each by Does not apply route daily. Use with Tyler Aas pen   levothyroxine 75 MCG tablet Commonly known as:  SYNTHROID, LEVOTHROID Take 75 mcg by mouth daily before breakfast.   liraglutide 18 MG/3ML Sopn Commonly known as:  VICTOZA Inject 0.3 mLs (1.8 mg total) into the skin daily.   methotrexate 2.5 MG tablet Commonly known as:  RHEUMATREX Take 7.5 mg by mouth every Monday. For rash   metoprolol tartrate 25 MG tablet Commonly known as:  LOPRESSOR Take 1 tablet (25 mg total) by mouth 2 (two) times daily.   OLANZapine 5 MG tablet Commonly known as:  ZYPREXA Take 5 mg by mouth at bedtime.   ondansetron 8 MG tablet Commonly known as:  ZOFRAN Take 1 tablet (8 mg total) by mouth every 8 (eight) hours as needed for nausea.   ONETOUCH DELICA LANCETS 17P Misc USE LANCET TO TEST FIVE TIMES A DAY   pravastatin 40 MG tablet Commonly known as:  PRAVACHOL Take 1 tablet (40 mg total) by mouth daily.   V-GO 40 Kit USE ONCE DAILY AS DIRECTED       Allergies:  Allergies  Allergen Reactions  . Flagyl [Metronidazole Hcl] Itching and Rash  . Ciprofloxacin Itching and Rash  . Metformin And Related Rash  . Milk-Related Compounds Other (See Comments)    Stomach pains  . Other Other (See Comments)    [place] nuts cause severe facial redness    Past Medical History:  Diagnosis Date  . Bipolar disorder ([place])   . Depression    Bipolar  . GERD (gastroesophageal reflux disease)   . History of hiatal hernia   . History of kidney stones   . History of stomach ulcers 1970s   "bleeding"  . Hyperlipidemia   . Hypertension   . Hypothyroidism   . Ischemic colitis, enteritis, or enterocolitis ([place])   . OSA on CPAP   . Pericarditis 05/06/2017  . Pneumonia    "several times" (06/23/2017)  . Thyroid  disease   . TIA (transient ischemic attack) 2011 X 2  . Type II diabetes mellitus ([place])   . Urinary bladder incontinence   . Vertigo     Past Surgical History:  Procedure Laterality Date  . APPENDECTOMY    . CARDIAC CATHETERIZATION N/A 12/24/2014   Procedure: Right/Left Heart Cath and Coronary Angiography;  Surgeon: Sanda Klein, MD;  Location: [facility];  Service: Cardiovascular;  Laterality: N/A;  . CATARACT EXTRACTION W/ INTRAOCULAR LENS  IMPLANT, BILATERAL Bilateral   . EXCISIONAL HEMORRHOIDECTOMY    . EYE SURGERY    . FRACTURE SURGERY    . IR THORACENTESIS ASP PLEURAL SPACE W/IMG GUIDE  05/13/2017  . LAPAROSCOPIC CHOLECYSTECTOMY    . LITHOTRIPSY  "  several times"  . RETINAL DETACHMENT SURGERY     "think it was on the left; not sure" (06/23/2017)  . TONSILLECTOMY    . VAGINAL HYSTERECTOMY     partial     Family History  Problem Relation Age of Onset  . Heart disease Mother   . Stroke Father   . Parkinson's disease Father   . Cancer Sister   . Cancer Brother     Social History:  reports that she has never smoked. She has never used smokeless tobacco. She reports that she does not drink alcohol or use drugs.    Review of Systems      The blood pressure has been usually controlled with a combination of Bisoprolol, diltiazem  as well as Aldactone.  Followed by PCP Benicar previously stopped because of  Diarrhea   Lab Results  Component Value Date   CREATININE 1.02 12/19/2017   BUN 17 12/19/2017   NA 139 12/19/2017   K 4.7 12/19/2017   CL 101 12/19/2017   CO2 29 12/19/2017    NEUROPATHY: She has lower leg neuropathic pains and takes gabapentin  LIPIDS: Controlled with 20 mg pravastatin       Lab Results  Component Value Date   CHOL 149 03/26/2017   HDL 50 03/26/2017   LDLCALC 73 03/26/2017   TRIG 130 03/26/2017   CHOLHDL 3.0 03/26/2017       Thyroid:    Has had hypothyroidism for over 20 years, taking 75 mcg levothyroxine  Last TSH  normal  Lab Results  Component Value Date   TSH 0.71 10/17/2017   TSH 1.495 06/23/2017   TSH 1.78 05/20/2017   FREET4 1.04 05/05/2017   FREET4 0.86 01/31/2014   FREET4 0.91 08/28/2013    Depression: She is on long-term Zyprexa 5 mg daily     Physical Examination:  BP 110/76 (BP Location: Left Arm, Patient Position: Sitting, Cuff Size: Normal)   Pulse 72   Ht 5' 3" (1.6 m)   Wt 221 lb 3.2 oz (100.3 kg)   SpO2 96%   BMI 39.18 kg/m     ASSESSMENT/PLAN:   Diabetes type 2, current BMI 39 See history of present illness for detailed discussion of current  management, blood sugar patterns and problems identified.  She is on a regimen of the V-go pump, Tresiba and also Invokana and Victoza  A1c is 7.7  Her blood sugars are variable and tend to be higher in the mornings and after supper although not always consistent  She is gaining weight and not exercising much This is despite using Invokana and Victoza She has seen the dietitian previously and she thinks she is conscious of what she is doing with her meals  Since her fasting readings are mostly high she will increase her basal insulin to 12 units Also reduce her bolus at lunchtime by 2 units to prevent hypoglycemia-like symptoms and low normal readings She is also will need to check more blood sugars after supper  Patient Instructions  Take 12 U Insulin   4 clicks at lunch instead of 5         Shalona Harbour 12/23/2017, 10:07 AM   Note: This office note was prepared with Dragon voice recognition system technology. Any transcriptional errors that result from this process are unintentional.

## 2017-12-28 ENCOUNTER — Telehealth: Payer: Self-pay | Admitting: Nutrition

## 2017-12-28 NOTE — Telephone Encounter (Signed)
Message left  To call me to schedule her pump start

## 2018-01-06 ENCOUNTER — Other Ambulatory Visit: Payer: Self-pay | Admitting: Endocrinology

## 2018-01-06 ENCOUNTER — Telehealth: Payer: Self-pay | Admitting: Endocrinology

## 2018-01-06 MED ORDER — INSULIN LISPRO 100 UNIT/ML ~~LOC~~ SOLN: SUBCUTANEOUS | 3 refills | Status: DC

## 2018-01-06 NOTE — Telephone Encounter (Signed)
Pharmacy called stating patient stated she was wanting larger RX of her Humalog, due to taking 5 or 6 units with her first 2 meals of the day then 7 units in the evening, and also taking 3-4 units when still running high in the evening. Pharmacy was unable to specify any further information due to patient not stating. If this is true pharmacy request either new RX be sent or done verbally. Please Advise, thanks

## 2018-01-06 NOTE — Telephone Encounter (Signed)
Prescription with 80 units/day sent

## 2018-01-06 NOTE — Telephone Encounter (Signed)
Do we need to send a new rx for pt to have enough insulin? Please specify dose.

## 2018-02-02 ENCOUNTER — Other Ambulatory Visit: Payer: Self-pay | Admitting: Endocrinology

## 2018-02-02 ENCOUNTER — Other Ambulatory Visit: Payer: Self-pay | Admitting: Cardiovascular Disease

## 2018-03-01 ENCOUNTER — Other Ambulatory Visit: Payer: Self-pay

## 2018-03-01 MED ORDER — INSULIN DEGLUDEC 100 UNIT/ML ~~LOC~~ SOPN: 6.0000 [IU] | PEN_INJECTOR | Freq: Every day | SUBCUTANEOUS | 0 refills | Status: DC

## 2018-03-20 ENCOUNTER — Other Ambulatory Visit: Payer: Self-pay | Admitting: Endocrinology

## 2018-03-20 ENCOUNTER — Other Ambulatory Visit (INDEPENDENT_AMBULATORY_CARE_PROVIDER_SITE_OTHER): Payer: Medicare Other

## 2018-03-20 ENCOUNTER — Other Ambulatory Visit: Payer: Self-pay

## 2018-03-20 DIAGNOSIS — E1165 Type 2 diabetes mellitus with hyperglycemia: Secondary | ICD-10-CM

## 2018-03-20 DIAGNOSIS — Z794 Long term (current) use of insulin: Secondary | ICD-10-CM | POA: Diagnosis not present

## 2018-03-20 LAB — BASIC METABOLIC PANEL
BUN: 18 mg/dL (ref 6–23)
CO2: 30 mEq/L (ref 19–32)
Calcium: 9.4 mg/dL (ref 8.4–10.5)
Chloride: 98 mEq/L (ref 96–112)
Creatinine, Ser: 1 mg/dL (ref 0.40–1.20)
GFR: 53.78 mL/min — ABNORMAL LOW (ref >60.00)
Glucose, Bld: 215 mg/dL — ABNORMAL HIGH (ref 70–99)
POTASSIUM: 4 meq/L (ref 3.5–5.1)
SODIUM: 138 meq/L (ref 135–145)

## 2018-03-20 LAB — HEMOGLOBIN A1C: Hgb A1c MFr Bld: 8.8 % — ABNORMAL HIGH (ref 4.6–6.5)

## 2018-03-21 ENCOUNTER — Other Ambulatory Visit: Payer: Self-pay

## 2018-03-21 MED ORDER — LIRAGLUTIDE 18 MG/3ML ~~LOC~~ SOPN: 1.8000 mg | PEN_INJECTOR | Freq: Every day | SUBCUTANEOUS | 2 refills | Status: DC

## 2018-03-23 ENCOUNTER — Ambulatory Visit: Payer: Medicare Other | Admitting: Endocrinology

## 2018-04-10 ENCOUNTER — Other Ambulatory Visit: Payer: Self-pay

## 2018-04-10 MED ORDER — INSULIN DEGLUDEC 100 UNIT/ML ~~LOC~~ SOPN: PEN_INJECTOR | SUBCUTANEOUS | 0 refills | Status: DC

## 2018-04-12 ENCOUNTER — Other Ambulatory Visit: Payer: Self-pay | Admitting: Endocrinology

## 2018-04-16 ENCOUNTER — Other Ambulatory Visit: Payer: Self-pay | Admitting: Endocrinology

## 2018-04-30 ENCOUNTER — Other Ambulatory Visit: Payer: Self-pay | Admitting: Endocrinology

## 2018-05-04 ENCOUNTER — Ambulatory Visit: Payer: Medicare Other

## 2018-05-08 ENCOUNTER — Other Ambulatory Visit: Payer: Self-pay

## 2018-05-09 ENCOUNTER — Other Ambulatory Visit: Payer: Self-pay

## 2018-05-09 ENCOUNTER — Ambulatory Visit (INDEPENDENT_AMBULATORY_CARE_PROVIDER_SITE_OTHER): Payer: Medicare Other | Admitting: Endocrinology

## 2018-05-09 DIAGNOSIS — Z794 Long term (current) use of insulin: Secondary | ICD-10-CM | POA: Diagnosis not present

## 2018-05-09 DIAGNOSIS — E1165 Type 2 diabetes mellitus with hyperglycemia: Secondary | ICD-10-CM | POA: Diagnosis not present

## 2018-05-09 NOTE — Progress Notes (Signed)
Patient ID: [name], female   DOB: 07/12/1940, 78 y.o.   MRN: [id number]    Today's office visit was provided via telemedicine using audio phone call Explained to the patient and the the limitations of evaluation and management by telemedicine and the availability of in person appointments.  The patient understood the limitations and agreed to proceed. Patient also understood that the telehealth visit is billable. . Location of the patient: Home . Location of the provider: Office Only the patient and myself were participating in the encounter    Reason for Appointment: Followup for Type 2 Diabetes    History of Present Illness:          Diagnosis: Type 2 diabetes mellitus, date of diagnosis:2011       Past history: Since she was apparently intolerant to metformin she was treated with Onglyza until about 2014  At that time because of increasing A1c of about 8% she was started on Levemir 15 units and this was aggressively increased Onglyza was continued She has not tried any other treatments for diabetes Her A1c has been 10-10.5 in 2015 In 3/15 she was told to start small doses of NovoLog at lunch and supper and add 15 units of Levemir at night also To control  hyperglycemia with her basal bolus insulin regimen she was given Victoza in addition on her initial consultation in 5/15 She has been on Victoza since 8/15  She has been on the V-go pump since 09/2014  Recent history:   INSULIN regimen :  V-go pump 40 unit basal, boluses at meal times: 10-10-/14 units TRESIBA 10 units daily   Noninsulin hypoglycemic drugs the patient is taking are: Invokana 300 mg, on VICTOZA 1.8 mg  Her A1c is 8.8 and progressively higher, previously 7.7   Current management, blood sugar patterns and problems:  She checks blood sugars mostly in the mornings and not usually after meals  Blood sugars are quite variable fasting and as high as 257  She thinks that most of her high  sugars may be related to eating snacks during the night when she gets hungry and this could be a bowl of cereal  She does this without any bolus coverage  Also blood sugars are variably high at lunchtime and bedtime  However blood sugar average of 160 does not correlate with her A1c  She now says that in the last 3 or 4 days she is having blood sugars in the mornings around 100 and sometimes below  She thinks she is consistent with her Victoza and Invokana  Also is changing her pump usually at the same time daily  Because of needing significant amount of bolus insulin she thinks that sometimes she may not have enough insulin to cover her evening meal  She does take the [place] in the morning daily but does not adjusted based on her fasting readings  She does not want to use the OmniPod pump even though she was approved because the electronics involved   Meals: 3 meals per day. Bfst oatmeal or egg or cereal at 8-9 am, dinner 6-7 pm.; no hs snacks   Side effects from medications have been: Metformin: rash  Glucose monitoring:  done 1 time a day or less, mostly in the morning       Glucometer:  One Touch.      Blood Glucose readings from home glucose meter download on 10/17:    PRE-MEAL Fasting Lunch Dinner Bedtime Overall  Glucose range:  88-257  142-380  102-155  9 9-212  88-380  Mean/median:  171  183   152   POST-MEAL PC Breakfast PC Lunch PC Dinner  Glucose range:     Mean/median:  147     PREVIOUS readings:  PRE-MEAL Fasting Lunch Dinner Bedtime Overall  Glucose range: 151-259  125-237    70-259  Mean/median:  166   146  160 +/-48   POST-MEAL PC Breakfast PC Lunch PC Dinner  Glucose range:   70-188  196, 254  Mean/median:       Glycemic control:    Lab Results  Component Value Date   HGBA1C 8.8 (H) 03/20/2018   HGBA1C 7.7 (H) 12/19/2017   HGBA1C 7.4 (H) 08/24/2017   Lab Results  Component Value Date   MICROALBUR <0.7 05/26/2017   LDLCALC 73 03/26/2017    CREATININE 1.00 03/20/2018    Self-care: The diet that the patient has been following is: tries to limit fats   Breakfast: She will add [place] bacon or sausage for protein, usually getting eggs also           Exercise: Walks 20 min 2-4/7  days     Dietician visit: Most recent: 2/19   CDE visit: 9/16    Weight history:  Wt Readings from Last 3 Encounters:  12/23/17 221 lb 3.2 oz (100.3 kg)  10/19/17 214 lb (97.1 kg)  09/09/17 214 lb (97.1 kg)    Other active problems: See review of systems    Allergies as of 05/09/2018      Reactions   Flagyl [metronidazole Hcl] Itching, Rash   Ciprofloxacin Itching, Rash   Metformin And Related Rash   Milk-related Compounds Other (See Comments)   Stomach pains   Other Other (See Comments)   [place] nuts cause severe facial redness      Medication List       Accurate as of May 09, 2018  2:45 PM. Always use your most recent med list.        aspirin 81 MG EC tablet Take 1 tablet (81 mg total) by mouth daily.   betamethasone dipropionate 0.05 % cream Commonly known as:  DIPROLENE Apply 1 application topically 2 (two) times daily as needed (rash).   carbamazepine 200 MG 12 hr capsule Commonly known as:  CARBATROL Take 200 mg by mouth 2 (two) times daily.   clobetasol 0.05 % Gel Commonly known as:  TEMOVATE Apply 1 application topically 2 (two) times daily as needed (rash).   diltiazem 360 MG 24 hr capsule Commonly known as:  CARDIZEM CD Take 1 capsule (360 mg total) by mouth at bedtime.   esomeprazole 40 MG capsule Commonly known as:  NexIUM Take 1 capsule (40 mg total) by mouth daily at 12 noon.   folic acid 1 MG tablet Commonly known as:  FOLVITE Take 1 mg by mouth daily.   furosemide 40 MG tablet Commonly known as:  LASIX TAKE ONE TABLET BY MOUTH DAILY   insulin lispro 100 UNIT/ML injection Commonly known as:  HumaLOG Use to fill V-Go pump with 80 units daily   Insulin Pen Needle 33G X 5 MM Misc Commonly  known as:  Comfort EZ Pen Needles 1 each by Does not apply route daily. Use with Tresiba pen   Insulin Pen Needle 32G X 5 MM Misc 1 each by Does not apply route daily. Use with Tresiba pen   Invokana 300 MG Tabs tablet Generic drug:  canagliflozin TAKE 1 TABLET BY  MOUTH DAILY BEFORE BREAKFAST   levothyroxine 75 MCG tablet Commonly known as:  SYNTHROID Take 75 mcg by mouth daily before breakfast.   liraglutide 18 MG/3ML Sopn Commonly known as:  VICTOZA Inject 0.3 mLs (1.8 mg total) into the skin daily.   methotrexate 2.5 MG tablet Commonly known as:  RHEUMATREX Take 7.5 mg by mouth every Monday. For rash   metoprolol tartrate 25 MG tablet Commonly known as:  LOPRESSOR Take 1 tablet (25 mg total) by mouth 2 (two) times daily.   OLANZapine 5 MG tablet Commonly known as:  ZYPREXA Take 5 mg by mouth at bedtime.   ondansetron 8 MG tablet Commonly known as:  Zofran Take 1 tablet (8 mg total) by mouth every 8 (eight) hours as needed for nausea.   OneTouch Delica Lancets 15V Misc USE LANCET TO TEST FIVE TIMES A DAY   OneTouch Verio test strip Generic drug:  glucose blood USE TO TEST BLOOD SUGAR THREE TIMES A DAY   pravastatin 40 MG tablet Commonly known as:  PRAVACHOL Take 1 tablet (40 mg total) by mouth daily.   Tyler Aas FlexTouch 100 UNIT/ML Sopn FlexTouch Pen Generic drug:  insulin degludec Inject 10 Units into the skin daily. Inject 10 units under the skin once daily.   V-Go 40 Kit USE ONCE DAILY AS DIRECTED       Allergies:  Allergies  Allergen Reactions  . Flagyl [Metronidazole Hcl] Itching and Rash  . Ciprofloxacin Itching and Rash  . Metformin And Related Rash  . Milk-Related Compounds Other (See Comments)    Stomach pains  . Other Other (See Comments)    [place] nuts cause severe facial redness    Past Medical History:  Diagnosis Date  . Bipolar disorder ([place])   . Depression    Bipolar  . GERD (gastroesophageal reflux disease)   . History of  hiatal hernia   . History of kidney stones   . History of stomach ulcers 1970s   "bleeding"  . Hyperlipidemia   . Hypertension   . Hypothyroidism   . Ischemic colitis, enteritis, or enterocolitis ([place])   . OSA on CPAP   . Pericarditis 05/06/2017  . Pneumonia    "several times" (06/23/2017)  . Thyroid disease   . TIA (transient ischemic attack) 2011 X 2  . Type II diabetes mellitus ([place])   . Urinary bladder incontinence   . Vertigo     Past Surgical History:  Procedure Laterality Date  . APPENDECTOMY    . CARDIAC CATHETERIZATION N/A 12/24/2014   Procedure: Right/Left Heart Cath and Coronary Angiography;  Surgeon: Sanda Klein, MD;  Location: [facility];  Service: Cardiovascular;  Laterality: N/A;  . CATARACT EXTRACTION W/ INTRAOCULAR LENS  IMPLANT, BILATERAL Bilateral   . EXCISIONAL HEMORRHOIDECTOMY    . EYE SURGERY    . FRACTURE SURGERY    . IR THORACENTESIS ASP PLEURAL SPACE W/IMG GUIDE  05/13/2017  . LAPAROSCOPIC CHOLECYSTECTOMY    . LITHOTRIPSY  "several times"  . RETINAL DETACHMENT SURGERY     "think it was on the left; not sure" (06/23/2017)  . TONSILLECTOMY    . VAGINAL HYSTERECTOMY     partial     Family History  Problem Relation Age of Onset  . Heart disease Mother   . Stroke Father   . Parkinson's disease Father   . Cancer Sister   . Cancer Brother     Social History:  reports that she has never smoked. She has never used smokeless  tobacco. She reports that she does not drink alcohol or use drugs.    Review of Systems     NEUROPATHY: She has lower leg pain and takes gabapentin twice a day as prescribed      Lipids: Last LDL from PCP was 101, followed by PCP also        Lab Results  Component Value Date   CHOL 149 03/26/2017   HDL 50 03/26/2017   LDLCALC 73 03/26/2017   TRIG 130 03/26/2017   CHOLHDL 3.0 03/26/2017       Thyroid:   Has had presumed hypothyroidism for over 20 years, has not had a change in medications in several years and  last TSH was normal, followed by PCP   Lab Results  Component Value Date   TSH 0.71 10/17/2017   TSH 1.495 06/23/2017   TSH 1.78 05/20/2017   FREET4 1.04 05/05/2017   FREET4 0.86 01/31/2014   FREET4 0.91 08/28/2013       The blood pressure has been treated by PCP with a combination of amlodipine and Benicar as well as hydralazine and bisoprolol Not checking at home     Physical Examination:  There were no vitals taken for this visit.     ASSESSMENT/PLAN:   Diabetes type 2, with obesity  See history of present illness for detailed discussion of his current management, blood sugar patterns and problems identified  A1c appears to be progressively higher She has had variable blood sugars but just checking mostly in the morning Most of her high readings are probably related to diet, excessive snacking including at night without adequate mealtime coverage She is also taking supplemental Tyler Aas  As before she may do better with the Omni pod pump but she does not think she can handle this  Discussed her day to day management in detail and made the following recommendations:  She needs to check her blood sugars consistently at different times including after meals to help assess the need for adjustment of bolus insulin If she is able to improve her diet overall and cut back on snacks that would be helpful In the meantime if she has any bolus insulin available in her pump to cover her snacks at night she should take 1-2 clicks No change in [place] unless her morning sugars start going higher again Continue increasing walking which should help Cut back on carbohydrates overall Consider follow-up with dietitian Change the V-go pump consistently at the same time  Follow-up in 3 months  Follow-up with PCP regarding hypertension, thyroid and lipids  Total telephone visit time =12 minutes  There are no Patient Instructions on file for this visit.   Elayne Snare 05/09/2018, 2:45  PM   Note: This office note was prepared with Dragon voice recognition system technology. Any transcriptional errors that result from this process are unintentional.

## 2018-05-25 ENCOUNTER — Other Ambulatory Visit: Payer: Self-pay

## 2018-05-25 MED ORDER — LIRAGLUTIDE 18 MG/3ML ~~LOC~~ SOPN: 1.8000 mg | PEN_INJECTOR | Freq: Every day | SUBCUTANEOUS | 2 refills | Status: DC

## 2018-06-01 ENCOUNTER — Other Ambulatory Visit: Payer: Self-pay | Admitting: Physician Assistant

## 2018-06-01 ENCOUNTER — Telehealth: Payer: Self-pay | Admitting: *Deleted

## 2018-06-01 NOTE — Telephone Encounter (Signed)
Do not refill MCr

## 2018-06-01 NOTE — Telephone Encounter (Signed)
Per epic note office visit on 09/09/17 patient was to wean off Metoprolol and stop medication. [facility] is requesting refill, please advise. Thank you.

## 2018-06-02 NOTE — Telephone Encounter (Signed)
Dr Royann Shivers said do not refill

## 2018-06-16 ENCOUNTER — Other Ambulatory Visit: Payer: Self-pay | Admitting: Endocrinology

## 2018-06-17 ENCOUNTER — Other Ambulatory Visit: Payer: Self-pay | Admitting: Endocrinology

## 2018-06-19 ENCOUNTER — Other Ambulatory Visit: Payer: Self-pay | Admitting: Endocrinology

## 2018-06-29 ENCOUNTER — Other Ambulatory Visit: Payer: Self-pay | Admitting: Endocrinology

## 2018-07-12 ENCOUNTER — Other Ambulatory Visit: Payer: Self-pay | Admitting: Endocrinology

## 2018-07-14 ENCOUNTER — Other Ambulatory Visit: Payer: Self-pay

## 2018-07-14 MED ORDER — INSULIN LISPRO 100 UNIT/ML ~~LOC~~ SOLN: SUBCUTANEOUS | 1 refills | Status: DC

## 2018-08-07 ENCOUNTER — Ambulatory Visit: Payer: Medicare Other | Admitting: Primary Care

## 2018-08-07 ENCOUNTER — Other Ambulatory Visit: Payer: Self-pay | Admitting: Endocrinology

## 2018-08-07 DIAGNOSIS — E1165 Type 2 diabetes mellitus with hyperglycemia: Secondary | ICD-10-CM

## 2018-08-07 DIAGNOSIS — E063 Autoimmune thyroiditis: Secondary | ICD-10-CM

## 2018-08-07 DIAGNOSIS — Z794 Long term (current) use of insulin: Secondary | ICD-10-CM

## 2018-08-08 ENCOUNTER — Other Ambulatory Visit: Payer: Medicare Other

## 2018-08-11 ENCOUNTER — Ambulatory Visit: Payer: Medicare Other | Admitting: Endocrinology

## 2018-08-14 ENCOUNTER — Ambulatory Visit: Payer: Medicare Other | Admitting: Primary Care

## 2018-08-14 ENCOUNTER — Other Ambulatory Visit: Payer: Self-pay

## 2018-08-14 ENCOUNTER — Encounter: Payer: Self-pay | Admitting: Primary Care

## 2018-08-14 ENCOUNTER — Ambulatory Visit (INDEPENDENT_AMBULATORY_CARE_PROVIDER_SITE_OTHER): Payer: Medicare Other | Admitting: Primary Care

## 2018-08-14 DIAGNOSIS — J90 Pleural effusion, not elsewhere classified: Secondary | ICD-10-CM

## 2018-08-14 DIAGNOSIS — G4733 Obstructive sleep apnea (adult) (pediatric): Secondary | ICD-10-CM

## 2018-08-14 NOTE — Patient Instructions (Signed)
Renew CPAP supplies with [employer]  No pressure changes to CPAP  Continue to wear CPAP every night for 4-6 hours or more   Do not drive if experiencing excessive daytime fatigue or somnolence   CXR re: follow up pleural effusions  Follow up in 1 year with Dr. Elsworth Soho

## 2018-08-14 NOTE — Progress Notes (Signed)
Virtual Visit via Telephone Note  I connected with [name] on 08/14/18 at  9:30 AM EDT by telephone and verified that I am speaking with the correct person using two identifiers.  Location: Patient: Home Provider: Office   I discussed the limitations, risks, security and privacy concerns of performing an evaluation and management service by telephone and the availability of in person appointments. I also discussed with the patient that there may be a patient responsible charge related to this service. The patient expressed understanding and agreed to proceed.   History of Present Illness: 78 year old female, never smoked. PMH OSA, bilateral pleural effusion, HTN, pericardial effusion. Patient of Dr. Elsworth Soho, last seen on 08/04/17. NPSG ([place] ) 05/2017 AHI 24/h, supine AHI 34/h. Maintained on CPAP 10cm H20.   08/14/2018 Patient contacted today for televisit OSA follow-up. She is doing well, no acute complaints. She is 100% compliant with CPAP. No issues with mask fit or pressure. She got a new machine last year and she is very pleased. DME company is [employer]. Uses nasal mask. Sleeping well at night, wakes up 2-3 times to use restroom. She has had botox injections with urology, last one in February but not lasting as long. Denies shortness of breath, wheezing, cough or chest pain.   Airview download 30/30 day (100%); 97% >4 hours Usage 7 hours 29 mins Pressure 10cm H20 Leaks 9.2 L/min (95%) AHI 1.4    Observations/Objective:  - No shortness of breath, wheezing or cough noted during phone conversation  Assessment and Plan:  OSA - 100% compliant with cpap and reports benefit from use - Pressure 10cm H20; AHI 1.4 - No changes - Renew supplies with [employer]  Bilateral pleural effusion - No shortness of breath or chest pain - Repeat CXR   Follow Up Instructions:   - FU in 1 year with Dr. Elsworth Soho  I discussed the assessment and treatment plan with the patient. The patient was  provided an opportunity to ask questions and all were answered. The patient agreed with the plan and demonstrated an understanding of the instructions.   The patient was advised to call back or seek an in-person evaluation if the symptoms worsen or if the condition fails to improve as anticipated.  I provided 15 minutes of non-face-to-face time during this encounter.   Martyn Ehrich, NP

## 2018-08-16 ENCOUNTER — Other Ambulatory Visit: Payer: Self-pay

## 2018-08-16 ENCOUNTER — Other Ambulatory Visit (INDEPENDENT_AMBULATORY_CARE_PROVIDER_SITE_OTHER): Payer: Medicare Other

## 2018-08-16 DIAGNOSIS — E063 Autoimmune thyroiditis: Secondary | ICD-10-CM

## 2018-08-16 DIAGNOSIS — E1165 Type 2 diabetes mellitus with hyperglycemia: Secondary | ICD-10-CM

## 2018-08-16 DIAGNOSIS — Z794 Long term (current) use of insulin: Secondary | ICD-10-CM

## 2018-08-16 LAB — COMPREHENSIVE METABOLIC PANEL
ALT: 25 U/L (ref 0–35)
AST: 20 U/L (ref 0–37)
Albumin: 4.2 g/dL (ref 3.5–5.2)
Alkaline Phosphatase: 106 U/L (ref 39–117)
BUN: 17 mg/dL (ref 6–23)
CO2: 29 mEq/L (ref 19–32)
Calcium: 9.7 mg/dL (ref 8.4–10.5)
Chloride: 101 mEq/L (ref 96–112)
Creatinine, Ser: 0.88 mg/dL (ref 0.40–1.20)
GFR: 62.26 mL/min (ref >60.00)
Glucose, Bld: 171 mg/dL — ABNORMAL HIGH (ref 70–99)
Potassium: 4.3 mEq/L (ref 3.5–5.1)
Sodium: 138 mEq/L (ref 135–145)
Total Bilirubin: 0.3 mg/dL (ref 0.2–1.2)
Total Protein: 7.2 g/dL (ref 6.0–8.3)

## 2018-08-16 LAB — MICROALBUMIN / CREATININE URINE RATIO
Creatinine,U: 36.9 mg/dL
Microalb Creat Ratio: 1.9 mg/g (ref 0.0–30.0)
Microalb, Ur: <0.7 mg/dL (ref 0.0–1.9)

## 2018-08-16 LAB — TSH: TSH: 3.59 u[IU]/mL (ref 0.35–4.50)

## 2018-08-16 LAB — HEMOGLOBIN A1C: Hgb A1c MFr Bld: 7.8 % — ABNORMAL HIGH (ref 4.6–6.5)

## 2018-08-18 ENCOUNTER — Other Ambulatory Visit: Payer: Self-pay

## 2018-08-18 ENCOUNTER — Ambulatory Visit (INDEPENDENT_AMBULATORY_CARE_PROVIDER_SITE_OTHER): Payer: Medicare Other | Admitting: Endocrinology

## 2018-08-18 ENCOUNTER — Encounter: Payer: Self-pay | Admitting: Endocrinology

## 2018-08-18 DIAGNOSIS — E1165 Type 2 diabetes mellitus with hyperglycemia: Secondary | ICD-10-CM | POA: Diagnosis not present

## 2018-08-18 DIAGNOSIS — E063 Autoimmune thyroiditis: Secondary | ICD-10-CM | POA: Diagnosis not present

## 2018-08-18 DIAGNOSIS — Z794 Long term (current) use of insulin: Secondary | ICD-10-CM | POA: Diagnosis not present

## 2018-08-18 MED ORDER — METFORMIN HCL 500 MG PO TABS: ORAL_TABLET | ORAL | 3 refills | Status: DC

## 2018-08-18 NOTE — Progress Notes (Signed)
Patient ID: [name], female   DOB: 16-Feb-1940, 78 y.o.   MRN: [id number]    Today's office visit was provided via telemedicine using audio phone call Explained to the patient and the the limitations of evaluation and management by telemedicine and the availability of in person appointments.  The patient understood the limitations and agreed to proceed. Patient also understood that the telehealth visit is billable. . Location of the patient: Home . Location of the provider: Office Only the patient and myself were participating in the encounter    Reason for Appointment: Followup for Type 2 Diabetes    History of Present Illness:          Diagnosis: Type 2 diabetes mellitus, date of diagnosis:2011       Past history: Since she was apparently intolerant to metformin she was treated with Onglyza until about 2014  At that time because of increasing A1c of about 8% she was started on Levemir 15 units and this was aggressively increased Onglyza was continued She has not tried any other treatments for diabetes Her A1c has been 10-10.5 in 2015 In 3/15 she was told to start small doses of NovoLog at lunch and supper and add 15 units of Levemir at night also To control  hyperglycemia with her basal bolus insulin regimen she was given Victoza in addition on her initial consultation in 5/15 She has been on Victoza since 8/15  She has been on the V-go pump since 09/2014  Recent history:   INSULIN regimen :  V-go pump 40 unit basal, boluses at meal times: 10-10-/14 units TRESIBA 10 units daily   Noninsulin hypoglycemic drugs the patient is taking are: Invokana 300 mg, on VICTOZA 1.8 mg  Her A1c is 7.8, previously had gone up to 8.8   Current management, blood sugar patterns and problems:  No changes were made in her regimen on the last visit but she was recommended trying to change her pump at the same time, try to improve diet and start walking for exercise  She has  done a few more blood sugars later in the day but not consistently appears to have only sporadic high readings the rest of the day  She does have a snack late at night around 9 PM but usually mostly a protein type of snack  She is occasionally running out of insulin before she changes her pump in the early afternoon, occasionally may not have a bolus left for her afternoon snack  Otherwise still trying to adjust her suppertime bolus based on her meal size, usually 6-7 clicks  Overall trying to do her boluses consistently before starting to eat  She does take the [place] in the morning daily without fail  She does not want to use the OmniPod pump even though she was approved because the pump was too complicated for her   Meals: 3 meals per day. Bfst oatmeal or egg or cereal at 8-9 am, dinner 6-7 pm.; no hs snacks   Exercise: Walks 15 min usually 5  days per week  Side effects from medications have been: Metformin: rash, she tried this when she had prediabetes  Glucose monitoring:  done 1 time a day or less, mostly in the morning       Glucometer:  One Touch.      Blood Glucose readings from home glucose meter download     PRE-MEAL Fasting Lunch Dinner Bedtime Overall  Glucose range:  130-210  Mean/median:  170  143   178  173   POST-MEAL PC Breakfast PC Lunch PC Dinner  Glucose range:     Mean/median:   170     Previous readings  PRE-MEAL Fasting Lunch Dinner Bedtime Overall  Glucose range:  88-257  142-380  102-155  9 9-212  88-380  Mean/median:  171  183   152   POST-MEAL PC Breakfast PC Lunch PC Dinner  Glucose range:     Mean/median:  147       Glycemic control:    Lab Results  Component Value Date   HGBA1C 7.8 (H) 08/16/2018   HGBA1C 8.8 (H) 03/20/2018   HGBA1C 7.7 (H) 12/19/2017   Lab Results  Component Value Date   MICROALBUR <0.7 08/16/2018   LDLCALC 73 03/26/2017   CREATININE 0.88 08/16/2018    Self-care: The diet that the patient has been  following is: tries to limit fats   Breakfast: She will add [place] bacon or sausage for protein, usually getting eggs also               Dietician visit: Most recent: 2/19   CDE visit: 9/16    Weight history:  Wt Readings from Last 3 Encounters:  12/23/17 221 lb 3.2 oz (100.3 kg)  10/19/17 214 lb (97.1 kg)  09/09/17 214 lb (97.1 kg)    Other active problems: See review of systems    Allergies as of 08/18/2018      Reactions   Flagyl [metronidazole Hcl] Itching, Rash   Ciprofloxacin Itching, Rash   Metformin And Related Rash   Milk-related Compounds Other (See Comments)   Stomach pains   Other Other (See Comments)   [place] nuts cause severe facial redness      Medication List       Accurate as of August 18, 2018  7:59 AM. If you have any questions, ask your nurse or doctor.        aspirin 81 MG EC tablet Take 1 tablet (81 mg total) by mouth daily.   betamethasone dipropionate 0.05 % cream Commonly known as: DIPROLENE Apply 1 application topically 2 (two) times daily as needed (rash).   carbamazepine 200 MG 12 hr capsule Commonly known as: CARBATROL Take 200 mg by mouth 2 (two) times daily.   clobetasol 0.05 % Gel Commonly known as: TEMOVATE Apply 1 application topically 2 (two) times daily as needed (rash).   diltiazem 360 MG 24 hr capsule Commonly known as: CARDIZEM CD Take 1 capsule (360 mg total) by mouth at bedtime.   esomeprazole 40 MG capsule Commonly known as: NexIUM Take 1 capsule (40 mg total) by mouth daily at 12 noon.   folic acid 1 MG tablet Commonly known as: FOLVITE Take 1 mg by mouth daily.   furosemide 40 MG tablet Commonly known as: LASIX TAKE ONE TABLET BY MOUTH DAILY   insulin lispro 100 UNIT/ML injection Commonly known as: HumaLOG USE TO FILL V-GO PUMP WITH 80 UNITS DAILY   Insulin Pen Needle 33G X 5 MM Misc Commonly known as: Comfort EZ Pen Needles 1 each by Does not apply route daily. Use with Tresiba pen   Insulin Pen  Needle 32G X 5 MM Misc 1 each by Does not apply route daily. Use with Tyler Aas pen   Invokana 300 MG Tabs tablet Generic drug: canagliflozin TAKE ONE TABLET BY MOUTH EVERY MORNING BEFORE BREAKFAST   levothyroxine 75 MCG tablet Commonly known as: SYNTHROID Take 75 mcg by mouth  daily before breakfast.   liraglutide 18 MG/3ML Sopn Commonly known as: VICTOZA Inject 0.3 mLs (1.8 mg total) into the skin daily.   methotrexate 2.5 MG tablet Commonly known as: RHEUMATREX Take 7.5 mg by mouth every Monday. For rash   metoprolol tartrate 25 MG tablet Commonly known as: LOPRESSOR TAKE ONE TABLET BY MOUTH TWICE A DAY   OLANZapine 5 MG tablet Commonly known as: ZYPREXA Take 5 mg by mouth at bedtime.   ondansetron 8 MG tablet Commonly known as: Zofran Take 1 tablet (8 mg total) by mouth every 8 (eight) hours as needed for nausea.   OneTouch Delica Lancets 87A Misc USE LANCET TO TEST FIVE TIMES A DAY   OneTouch Verio test strip Generic drug: glucose blood USE TO TEST BLOOD SUGAR THREE TIMES A DAY   pravastatin 40 MG tablet Commonly known as: PRAVACHOL Take 1 tablet (40 mg total) by mouth daily.   Tyler Aas FlexTouch 100 UNIT/ML Sopn FlexTouch Pen Generic drug: insulin degludec INJECT 10 UNITS UNDER THE SKIN ONCE DAILY   V-Go 40 Kit USE ONCE DAILY AS DIRECTED   Vimpat 50 MG Tabs tablet Generic drug: lacosamide Take 50 mg by mouth 2 (two) times daily.       Allergies:  Allergies  Allergen Reactions  . Flagyl [Metronidazole Hcl] Itching and Rash  . Ciprofloxacin Itching and Rash  . Metformin And Related Rash  . Milk-Related Compounds Other (See Comments)    Stomach pains  . Other Other (See Comments)    [place] nuts cause severe facial redness    Past Medical History:  Diagnosis Date  . Bipolar disorder ([place])   . Depression    Bipolar  . GERD (gastroesophageal reflux disease)   . History of hiatal hernia   . History of kidney stones   . History of stomach ulcers  1970s   "bleeding"  . Hyperlipidemia   . Hypertension   . Hypothyroidism   . Ischemic colitis, enteritis, or enterocolitis ([place])   . OSA on CPAP   . Pericarditis 05/06/2017  . Pneumonia    "several times" (06/23/2017)  . Thyroid disease   . TIA (transient ischemic attack) 2011 X 2  . Type II diabetes mellitus ([place])   . Urinary bladder incontinence   . Vertigo     Past Surgical History:  Procedure Laterality Date  . APPENDECTOMY    . CARDIAC CATHETERIZATION N/A 12/24/2014   Procedure: Right/Left Heart Cath and Coronary Angiography;  Surgeon: Sanda Klein, MD;  Location: [facility];  Service: Cardiovascular;  Laterality: N/A;  . CATARACT EXTRACTION W/ INTRAOCULAR LENS  IMPLANT, BILATERAL Bilateral   . EXCISIONAL HEMORRHOIDECTOMY    . EYE SURGERY    . FRACTURE SURGERY    . IR THORACENTESIS ASP PLEURAL SPACE W/IMG GUIDE  05/13/2017  . LAPAROSCOPIC CHOLECYSTECTOMY    . LITHOTRIPSY  "several times"  . RETINAL DETACHMENT SURGERY     "think it was on the left; not sure" (06/23/2017)  . TONSILLECTOMY    . VAGINAL HYSTERECTOMY     partial     Family History  Problem Relation Age of Onset  . Heart disease Mother   . Stroke Father   . Parkinson's disease Father   . Cancer Sister   . Cancer Brother     Social History:  reports that she has never smoked. She has never used smokeless tobacco. She reports that she does not drink alcohol or use drugs.    Review of Systems  NEUROPATHY: She has lower leg pain and takes gabapentin twice a day as prescribed      Lipids: Last LDL from PCP was 101, followed by PCP also        Lab Results  Component Value Date   CHOL 149 03/26/2017   HDL 50 03/26/2017   LDLCALC 73 03/26/2017   TRIG 130 03/26/2017   CHOLHDL 3.0 03/26/2017       Thyroid:   Has had presumed hypothyroidism for over 20 years, has not had a change in medications in several years Recent TSH was normal, followed by PCP also   Lab Results  Component Value  Date   TSH 3.59 08/16/2018   TSH 0.71 10/17/2017   TSH 1.495 06/23/2017   FREET4 1.04 05/05/2017   FREET4 0.86 01/31/2014   FREET4 0.91 08/28/2013       The blood pressure has been treated by PCP with a combination of amlodipine and Benicar as well as hydralazine and bisoprolol Not checking blood pressure at home     Physical Examination:  There were no vitals taken for this visit.     ASSESSMENT/PLAN:   Diabetes type 2, with obesity  See history of present illness for detailed discussion of his current management, blood sugar patterns and problems identified  A1c has improved She has been able to lose weight and blood sugars are overall better However fasting readings are still mostly high with the highest of the day She will likely need more [place]  Discussed the use of metformin as an adjunct Since she had a rash when she tried it about 15 years ago and not clear if she is allergic to it will try low doses again to see if she will tolerate it now she will start with 500 mg daily for the first 7 days and then increase up to a maximum of 1500 mg daily  Tresiba 12 units daily After every 3 to 4 days if her sugars are still higher than 140 in the morning she will go up to 14 units However if she has lower readings with starting metformin she can start cutting back  Follow-up in 3 months   Total telephone visit time = 8 minutes  There are no Patient Instructions on file for this visit.   Elayne Snare 08/18/2018, 7:59 AM   Note: This office note was prepared with Dragon voice recognition system technology. Any transcriptional errors that result from this process are unintentional.

## 2018-08-31 ENCOUNTER — Telehealth: Payer: Self-pay | Admitting: Endocrinology

## 2018-08-31 NOTE — Telephone Encounter (Signed)
Patient restarted Metformin per Dr. Ronnie Derby instructions after patient had allergic reaction years before. Patient has been taking Metformin for 1 1/2 weeks. Patient has a rash on her back, breasts, chest and head. Rash is very itchy. Patient states she is having an allergic reaction to the Metformin again. Please call patient at ph# [phone] to advise.

## 2018-08-31 NOTE — Telephone Encounter (Signed)
She will stop metformin and take OTC antihistamine like Claritin or Allegra.  Please confirm that she has adjusted her [place] Aas and her morning sugars are below 140

## 2018-09-01 NOTE — Telephone Encounter (Signed)
Called pt and gave her MD message. Pt verbalized understanding. 

## 2018-09-05 ENCOUNTER — Other Ambulatory Visit: Payer: Self-pay

## 2018-09-05 ENCOUNTER — Ambulatory Visit (INDEPENDENT_AMBULATORY_CARE_PROVIDER_SITE_OTHER)
Admission: RE | Admit: 2018-09-05 | Discharge: 2018-09-05 | Disposition: A | Discharge status: Home / Self Care | Payer: Medicare Other | Source: Ambulatory Visit | Attending: Primary Care | Admitting: Primary Care

## 2018-09-05 DIAGNOSIS — J90 Pleural effusion, not elsewhere classified: Secondary | ICD-10-CM

## 2018-09-05 NOTE — Progress Notes (Signed)
Please let patient know CXR looked fine. Minimal left pleural effusion.

## 2018-09-06 ENCOUNTER — Telehealth: Payer: Self-pay | Admitting: Pulmonary Disease

## 2018-09-06 NOTE — Telephone Encounter (Signed)
Returned call to patient and advised that pleural effusion is a small fluid bubble. At this time small and nothing needed to be done. Pt voices understanding. Nothing further needed.

## 2018-09-15 ENCOUNTER — Other Ambulatory Visit: Payer: Self-pay | Admitting: Endocrinology

## 2018-09-17 ENCOUNTER — Other Ambulatory Visit: Payer: Self-pay | Admitting: Endocrinology

## 2018-09-21 ENCOUNTER — Other Ambulatory Visit: Payer: Self-pay

## 2018-09-21 MED ORDER — TRESIBA FLEXTOUCH 100 UNIT/ML ~~LOC~~ SOPN: PEN_INJECTOR | SUBCUTANEOUS | 3 refills | Status: DC

## 2018-10-05 ENCOUNTER — Other Ambulatory Visit: Payer: Self-pay

## 2018-10-05 MED ORDER — VICTOZA 18 MG/3ML ~~LOC~~ SOPN: PEN_INJECTOR | SUBCUTANEOUS | 1 refills | Status: DC

## 2018-10-08 ENCOUNTER — Other Ambulatory Visit: Payer: Self-pay | Admitting: Endocrinology

## 2018-10-31 ENCOUNTER — Other Ambulatory Visit: Payer: Self-pay

## 2018-10-31 MED ORDER — VICTOZA 18 MG/3ML ~~LOC~~ SOPN: PEN_INJECTOR | SUBCUTANEOUS | 1 refills | Status: DC

## 2018-11-15 ENCOUNTER — Other Ambulatory Visit: Payer: Self-pay

## 2018-11-20 ENCOUNTER — Other Ambulatory Visit: Payer: Self-pay

## 2018-11-20 MED ORDER — METFORMIN HCL 500 MG PO TABS: ORAL_TABLET | ORAL | 1 refills | Status: DC

## 2018-11-21 ENCOUNTER — Other Ambulatory Visit: Payer: Self-pay

## 2018-11-21 MED ORDER — VICTOZA 18 MG/3ML ~~LOC~~ SOPN: PEN_INJECTOR | SUBCUTANEOUS | 1 refills | Status: DC

## 2018-11-30 ENCOUNTER — Other Ambulatory Visit: Payer: Self-pay | Admitting: Endocrinology

## 2018-12-10 ENCOUNTER — Other Ambulatory Visit: Payer: Self-pay | Admitting: Cardiovascular Disease

## 2018-12-13 ENCOUNTER — Other Ambulatory Visit: Payer: Self-pay | Admitting: Endocrinology

## 2018-12-18 ENCOUNTER — Other Ambulatory Visit: Payer: Self-pay

## 2018-12-18 ENCOUNTER — Encounter (INDEPENDENT_AMBULATORY_CARE_PROVIDER_SITE_OTHER): Payer: Medicare Other | Admitting: Endocrinology

## 2018-12-18 DIAGNOSIS — Z794 Long term (current) use of insulin: Secondary | ICD-10-CM | POA: Diagnosis not present

## 2018-12-18 DIAGNOSIS — E1165 Type 2 diabetes mellitus with hyperglycemia: Secondary | ICD-10-CM

## 2018-12-18 LAB — COMPREHENSIVE METABOLIC PANEL
ALT: 33 U/L (ref 0–35)
AST: 29 U/L (ref 0–37)
Albumin: 3.9 g/dL (ref 3.5–5.2)
Alkaline Phosphatase: 98 U/L (ref 39–117)
BUN: 14 mg/dL (ref 6–23)
CO2: 30 mEq/L (ref 19–32)
Calcium: 9.2 mg/dL (ref 8.4–10.5)
Chloride: 95 mEq/L — ABNORMAL LOW (ref 96–112)
Creatinine, Ser: 0.88 mg/dL (ref 0.40–1.20)
GFR: 62.21 mL/min (ref >60.00)
Glucose, Bld: 156 mg/dL — ABNORMAL HIGH (ref 70–99)
Potassium: 4.3 mEq/L (ref 3.5–5.1)
Sodium: 133 mEq/L — ABNORMAL LOW (ref 135–145)
Total Bilirubin: 0.3 mg/dL (ref 0.2–1.2)
Total Protein: 6.9 g/dL (ref 6.0–8.3)

## 2018-12-18 LAB — HEMOGLOBIN A1C: Hgb A1c MFr Bld: 7.7 % — ABNORMAL HIGH (ref 4.6–6.5)

## 2018-12-19 ENCOUNTER — Ambulatory Visit (INDEPENDENT_AMBULATORY_CARE_PROVIDER_SITE_OTHER): Payer: Medicare Other | Admitting: Endocrinology

## 2018-12-19 ENCOUNTER — Encounter: Payer: Self-pay | Admitting: Endocrinology

## 2018-12-19 DIAGNOSIS — Z794 Long term (current) use of insulin: Secondary | ICD-10-CM

## 2018-12-19 DIAGNOSIS — E1165 Type 2 diabetes mellitus with hyperglycemia: Secondary | ICD-10-CM

## 2018-12-19 NOTE — Progress Notes (Signed)
Patient ID: [name], female   DOB: [birth year], 78 y.o.   MRN: [id number]    Today's office visit was provided via telemedicine using audio phone call Explained to the patient and the the limitations of evaluation and management by telemedicine and the availability of in person appointments.  The patient understood the limitations and agreed to proceed. Patient also understood that the telehealth visit is billable. . Location of the patient: Home . Location of the provider: Office Only the patient and myself were participating in the encounter    Reason for Appointment: Followup for Type 2 Diabetes    History of Present Illness:          Diagnosis: Type 2 diabetes mellitus, date of diagnosis:2011       Past history: Since she was apparently intolerant to metformin she was treated with Onglyza until about 2014  At that time because of increasing A1c of about 8% she was started on Levemir 15 units and this was aggressively increased Onglyza was continued She has not tried any other treatments for diabetes Her A1c has been 10-10.5 in 2015 In 3/15 she was told to start small doses of NovoLog at lunch and supper and add 15 units of Levemir at night also To control  hyperglycemia with her basal bolus insulin regimen she was given Victoza in addition on her initial consultation in 5/15 She has been on Victoza since 8/15  She has been on the V-go pump since 09/2014  Recent history:   INSULIN regimen :  V-go pump 40 unit basal, boluses at meal times: 10-10-/12 units TRESIBA 14 units daily   Noninsulin hypoglycemic drugs the patient is taking are: Invokana 300 mg, on VICTOZA 1.8 mg  Her A1c is 7.7 and about the same   Current management, blood sugar patterns and problems:  She has not checked her blood sugars much only about once a day on an average  Overall her blood sugars higher about 2 to 3 weeks ago and.  She increased her Tyler Aas by another 2 units  She is  still using 40 units basal on the pump and changing the pump consistently in the mid afternoon  On her last visit she was tried on Metformin but she broke out in a rash  She has started Seroquel and her Zyprexa has increased  With this she appears to have gained weight and is now weighing about 16 pounds more than last December  As before she is only taking sporadic readings after meals  She does have more consistently high readings late at night when she checks  This is likely to be from her having snacks like peanut butter crackers, fruit without any bolus coverage, appears to be getting more carbohydrate at this time than before and overall has increased hunger sensation  She is still trying to walk when she can  No hypoglycemia with lowest blood sugar 94   Meals: 3 meals per day. Bfst oatmeal or egg or cereal at 8-9 am, dinner 6-7 pm.; no hs snacks   Exercise: Walks 20 min 3-4  days per week  Side effects from medications have been: Metformin: rash  Glucose monitoring:  done 1 time a day or less, mostly in the morning       Glucometer:  One Touch.      Blood Glucose readings from home glucose meter download    PRE-MEAL Fasting Lunch Dinner  overnight Overall  Glucose range:  148   157  146, 224   Mean/median:      153   POST-MEAL PC Breakfast PC Lunch PC Dinner  Glucose range:  123-149  94-211  108, 170  Mean/median:      PREVIOUS readings:  PRE-MEAL Fasting Lunch Dinner Bedtime Overall  Glucose range:  130-210      Mean/median:  170  143   178  173   POST-MEAL PC Breakfast PC Lunch PC Dinner  Glucose range:     Mean/median:   170     Previous readings  PRE-MEAL Fasting Lunch Dinner Bedtime Overall  Glucose range:  88-257  142-380  102-155  9 9-212  88-380  Mean/median:  171  183   152   POST-MEAL PC Breakfast PC Lunch PC Dinner  Glucose range:     Mean/median:  147       Glycemic control:    Lab Results  Component Value Date   HGBA1C 7.7 (H)  12/18/2018   HGBA1C 7.8 (H) 08/16/2018   HGBA1C 8.8 (H) 03/20/2018   Lab Results  Component Value Date   MICROALBUR <0.7 08/16/2018   LDLCALC 73 03/26/2017   CREATININE 0.88 12/18/2018    Self-care: The diet that the patient has been following is: tries to limit fats   Breakfast: She will add [place] bacon or sausage for protein, usually getting eggs also               Dietician visit: Most recent: 2/19   CDE visit: 9/16    Weight history:  Wt Readings from Last 3 Encounters:  12/23/17 221 lb 3.2 oz (100.3 kg)  10/19/17 214 lb (97.1 kg)  09/09/17 214 lb (97.1 kg)    Other active problems: See review of systems    Allergies as of 12/19/2018      Reactions   Flagyl [metronidazole Hcl] Itching, Rash   Ciprofloxacin Itching, Rash   Metformin And Related Rash   Milk-related Compounds Other (See Comments)   Stomach pains   Other Other (See Comments)   [place] nuts cause severe facial redness      Medication List       Accurate as of December 19, 2018 10:10 AM. If you have any questions, ask your nurse or doctor.        aspirin 81 MG EC tablet Take 1 tablet (81 mg total) by mouth daily.   betamethasone dipropionate 0.05 % cream Apply 1 application topically 2 (two) times daily as needed (rash).   carbamazepine 200 MG 12 hr capsule Commonly known as: CARBATROL Take 200 mg by mouth 2 (two) times daily.   clobetasol 0.05 % Gel Commonly known as: TEMOVATE Apply 1 application topically 2 (two) times daily as needed (rash).   diltiazem 360 MG 24 hr capsule Commonly known as: CARDIZEM CD Take 1 capsule (360 mg total) by mouth at bedtime.   esomeprazole 40 MG capsule Commonly known as: NexIUM Take 1 capsule (40 mg total) by mouth daily at 12 noon.   folic acid 1 MG tablet Commonly known as: FOLVITE Take 1 mg by mouth daily.   furosemide 40 MG tablet Commonly known as: LASIX TAKE ONE TABLET BY MOUTH DAILY   insulin lispro 100 UNIT/ML injection Commonly  known as: HumaLOG USE TO FILL V-GO WITH 80 UNITS DAILY   Insulin Pen Needle 33G X 5 MM Misc Commonly known as: Comfort EZ Pen Needles 1 each by Does not apply route daily. Use with Tresiba pen   Insulin Pen Needle 32G  X 5 MM Misc 1 each by Does not apply route daily. Use with Tyler Aas pen   BD Pen Needle Nano U/F 32G X 4 MM Misc Generic drug: Insulin Pen Needle USE 8 PEN NEEDLES PER DAY   Invokana 300 MG Tabs tablet Generic drug: canagliflozin TAKE ONE TABLET BY MOUTH EVERY MORNING BEFORE BREAKFAST   isosorbide mononitrate 30 MG 24 hr tablet Commonly known as: IMDUR TAKE ONE TABLET BY MOUTH DAILY   levothyroxine 75 MCG tablet Commonly known as: SYNTHROID Take 75 mcg by mouth daily before breakfast.   metFORMIN 500 MG tablet Commonly known as: GLUCOPHAGE 1 tablet at dinner for 7 days, then 1 twice daily, after 2 weeks 1 in a.m. and 2 at dinner   methotrexate 2.5 MG tablet Commonly known as: RHEUMATREX Take 7.5 mg by mouth every Monday. For rash   metoprolol tartrate 25 MG tablet Commonly known as: LOPRESSOR TAKE ONE TABLET BY MOUTH TWICE A DAY   OLANZapine 5 MG tablet Commonly known as: ZYPREXA Take 5 mg by mouth at bedtime.   ondansetron 8 MG tablet Commonly known as: Zofran Take 1 tablet (8 mg total) by mouth every 8 (eight) hours as needed for nausea.   OneTouch Delica Lancets 38F Misc USE LANCET TO TEST FIVE TIMES A DAY   OneTouch Verio test strip Generic drug: glucose blood USE TO TEST BLOOD SUGAR THREE TIMES A DAY   pravastatin 40 MG tablet Commonly known as: PRAVACHOL Take 1 tablet (40 mg total) by mouth daily.   Tyler Aas FlexTouch 100 UNIT/ML Sopn FlexTouch Pen Generic drug: insulin degludec INJECT 14 UNITS UNDER THE SKIN ONCE DAILY   V-Go 40 Kit USE ONCE DAILY AS DIRECTED   Victoza 18 MG/3ML Sopn Generic drug: liraglutide DIAL AND INJECT UNDER THE SKIN 1.8 MG DAILY Please provide 90 day supply.   Vimpat 50 MG Tabs tablet Generic drug:  lacosamide Take 50 mg by mouth 2 (two) times daily.       Allergies:  Allergies  Allergen Reactions  . Flagyl [Metronidazole Hcl] Itching and Rash  . Ciprofloxacin Itching and Rash  . Metformin And Related Rash  . Milk-Related Compounds Other (See Comments)    Stomach pains  . Other Other (See Comments)    [place] nuts cause severe facial redness    Past Medical History:  Diagnosis Date  . Bipolar disorder ([place])   . Depression    Bipolar  . GERD (gastroesophageal reflux disease)   . History of hiatal hernia   . History of kidney stones   . History of stomach ulcers 1970s   "bleeding"  . Hyperlipidemia   . Hypertension   . Hypothyroidism   . Ischemic colitis, enteritis, or enterocolitis ([place])   . OSA on CPAP   . Pericarditis 05/06/2017  . Pneumonia    "several times" (06/23/2017)  . Thyroid disease   . TIA (transient ischemic attack) 2011 X 2  . Type II diabetes mellitus ([place])   . Urinary bladder incontinence   . Vertigo     Past Surgical History:  Procedure Laterality Date  . APPENDECTOMY    . CARDIAC CATHETERIZATION N/A 12/24/2014   Procedure: Right/Left Heart Cath and Coronary Angiography;  Surgeon: Sanda Klein, MD;  Location: [facility];  Service: Cardiovascular;  Laterality: N/A;  . CATARACT EXTRACTION W/ INTRAOCULAR LENS  IMPLANT, BILATERAL Bilateral   . EXCISIONAL HEMORRHOIDECTOMY    . EYE SURGERY    . FRACTURE SURGERY    . IR THORACENTESIS ASP  PLEURAL SPACE W/IMG GUIDE  05/13/2017  . LAPAROSCOPIC CHOLECYSTECTOMY    . LITHOTRIPSY  "several times"  . RETINAL DETACHMENT SURGERY     "think it was on the left; not sure" (06/23/2017)  . TONSILLECTOMY    . VAGINAL HYSTERECTOMY     partial     Family History  Problem Relation Age of Onset  . Heart disease Mother   . Stroke Father   . Parkinson's disease Father   . Cancer Sister   . Cancer Brother     Social History:  reports that she has never smoked. She has never used smokeless tobacco. She  reports that she does not drink alcohol or use drugs.    Review of Systems    NEUROPATHY: She has history of lower leg pain Previously on gabapentin twice a day but she thinks that she did not have any pain and did not take this now      Lipids: Last LDL from PCP was 79, followed by PCP       Lab Results  Component Value Date   CHOL 149 03/26/2017   HDL 50 03/26/2017   LDLCALC 73 03/26/2017   TRIG 130 03/26/2017   CHOLHDL 3.0 03/26/2017       Thyroid:   Has had presumed hypothyroidism for over 20 years, has not had a change in her 75 mcg levothyroxine dose in several years Recent TSH was normal at 1.6, followed by PCP   Lab Results  Component Value Date   TSH 3.59 08/16/2018   TSH 0.71 10/17/2017   TSH 1.495 06/23/2017   FREET4 1.04 05/05/2017   FREET4 0.86 01/31/2014   FREET4 0.91 08/28/2013       The blood pressure has been treated by PCP with a combination of amlodipine and Benicar as well as hydralazine and bisoprolol Not checking blood pressure at home Last blood pressure 128/80     Physical Examination:  There were no vitals taken for this visit.     ASSESSMENT/PLAN:   Diabetes type 2, with obesity  See history of present illness for detailed discussion of his current management, blood sugar patterns and problems identified  A1c has stayed about the same at 7.7  Although she was starting to do better on her last visit because of her going on Seroquel and Zyprexa she has gained weight and getting more insulin resistant  However fasting readings are still mostly high with the highest of the day She will likely need more [place]  Discussed the use of metformin as an adjunct Since she had a rash when she tried it about 15 years ago and not clear if she is allergic to it will try low doses again to see if she will tolerate it now she will start with 500 mg daily for the first 7 days and then increase up to a maximum of 1500 mg daily  Tresiba 14 units  daily After every 3 to 4 days if her sugars are still higher than 140 in the morning she will go up to 14 units However if she has lower readings with starting metformin she can start cutting back  Follow-up in 3 months   Total telephone visit time = 11 minutes  There are no Patient Instructions on file for this visit.   Elayne Snare 12/19/2018, 10:10 AM   Note: This office note was prepared with Dragon voice recognition system technology. Any transcriptional errors that result from this process are unintentional.  Patient ID: [name], female   DOB: 06/01/1940, 78 y.o.   MRN: [id number]    Today's office visit was provided via telemedicine using audio phone call Explained to the patient and the the limitations of evaluation and management by telemedicine and the availability of in person appointments.  The patient understood the limitations and agreed to proceed. Patient also understood that the telehealth visit is billable. . Location of the patient: Home . Location of the provider: Office Only the patient and myself were participating in the encounter    Reason for Appointment: Followup for Type 2 Diabetes    History of Present Illness:          Diagnosis: Type 2 diabetes mellitus, date of diagnosis:2011       Past history: Since she was apparently intolerant to metformin she was treated with Onglyza until about 2014  At that time because of increasing A1c of about 8% she was started on Levemir 15 units and this was aggressively increased Onglyza was continued She has not tried any other treatments for diabetes Her A1c has been 10-10.5 in 2015 In 3/15 she was told to start small doses of NovoLog at lunch and supper and add 15 units of Levemir at night also To control  hyperglycemia with her basal bolus insulin regimen she was given Victoza in addition on her initial consultation in 5/15 She has been on Victoza since 8/15  She has been  on the V-go pump since 09/2014  Recent history:   INSULIN regimen :  V-go pump 40 unit basal, boluses at meal times: 10-10-/14 units TRESIBA 10 units daily   Noninsulin hypoglycemic drugs the patient is taking are: Invokana 300 mg, on VICTOZA 1.8 mg  Her A1c is 7.8, previously had gone up to 8.8   Current management, blood sugar patterns and problems:  No changes were made in her regimen on the last visit but she was recommended trying to change her pump at the same time, try to improve diet and start walking for exercise  She has done a few more blood sugars later in the day but not consistently appears to have only sporadic high readings the rest of the day  She does have a snack late at night around 9 PM but usually mostly a protein type of snack  She is occasionally running out of insulin before she changes her pump in the early afternoon, occasionally may not have a bolus left for her afternoon snack  Otherwise still trying to adjust her suppertime bolus based on her meal size, usually 6-7 clicks  Overall trying to do her boluses consistently before starting to eat  She does take the [place] in the morning daily without fail  She does not want to use the OmniPod pump even though she was approved because the pump was too complicated for her   Meals: 3 meals per day. Bfst oatmeal or egg or cereal at 8-9 am, dinner 6-7 pm.; no hs snacks   Exercise: Walks 15 min usually 5  days per week  Side effects from medications have been: Metformin: rash, she tried this when she had prediabetes  Glucose monitoring:  done 1 time a day or less, mostly in the morning       Glucometer:  One Touch.      Blood Glucose readings from home glucose meter download     PRE-MEAL Fasting Lunch Dinner Bedtime Overall  Glucose range:  130-210      Mean/median:  170  143   178  173   POST-MEAL PC Breakfast PC Lunch PC Dinner  Glucose range:     Mean/median:   170     Previous readings   PRE-MEAL Fasting Lunch Dinner Bedtime Overall  Glucose range:  88-257  142-380  102-155  9 9-212  88-380  Mean/median:  171  183   152   POST-MEAL PC Breakfast PC Lunch PC Dinner  Glucose range:     Mean/median:  147       Glycemic control:    Lab Results  Component Value Date   HGBA1C 7.7 (H) 12/18/2018   HGBA1C 7.8 (H) 08/16/2018   HGBA1C 8.8 (H) 03/20/2018   Lab Results  Component Value Date   MICROALBUR <0.7 08/16/2018   LDLCALC 73 03/26/2017   CREATININE 0.88 12/18/2018    Self-care: The diet that the patient has been following is: tries to limit fats   Breakfast: She will add [place] bacon or sausage for protein, usually getting eggs also               Dietician visit: Most recent: 2/19   CDE visit: 9/16    Weight history:  Wt Readings from Last 3 Encounters:  12/23/17 221 lb 3.2 oz (100.3 kg)  10/19/17 214 lb (97.1 kg)  09/09/17 214 lb (97.1 kg)    Other active problems: See review of systems    Allergies as of 12/19/2018      Reactions   Flagyl [metronidazole Hcl] Itching, Rash   Ciprofloxacin Itching, Rash   Metformin And Related Rash   Milk-related Compounds Other (See Comments)   Stomach pains   Other Other (See Comments)   [place] nuts cause severe facial redness      Medication List       Accurate as of December 19, 2018 10:11 AM. If you have any questions, ask your nurse or doctor.        aspirin 81 MG EC tablet Take 1 tablet (81 mg total) by mouth daily.   betamethasone dipropionate 0.05 % cream Apply 1 application topically 2 (two) times daily as needed (rash).   carbamazepine 200 MG 12 hr capsule Commonly known as: CARBATROL Take 200 mg by mouth 2 (two) times daily.   clobetasol 0.05 % Gel Commonly known as: TEMOVATE Apply 1 application topically 2 (two) times daily as needed (rash).   diltiazem 360 MG 24 hr capsule Commonly known as: CARDIZEM CD Take 1 capsule (360 mg total) by mouth at bedtime.   esomeprazole 40 MG  capsule Commonly known as: NexIUM Take 1 capsule (40 mg total) by mouth daily at 12 noon.   folic acid 1 MG tablet Commonly known as: FOLVITE Take 1 mg by mouth daily.   furosemide 40 MG tablet Commonly known as: LASIX TAKE ONE TABLET BY MOUTH DAILY   insulin lispro 100 UNIT/ML injection Commonly known as: HumaLOG USE TO FILL V-GO WITH 80 UNITS DAILY   Insulin Pen Needle 33G X 5 MM Misc Commonly known as: Comfort EZ Pen Needles 1 each by Does not apply route daily. Use with Tresiba pen   Insulin Pen Needle 32G X 5 MM Misc 1 each by Does not apply route daily. Use with Tyler Aas pen   BD Pen Needle Nano U/F 32G X 4 MM Misc Generic drug: Insulin Pen Needle USE 8 PEN NEEDLES PER DAY   Invokana 300 MG Tabs tablet Generic drug: canagliflozin TAKE ONE TABLET BY MOUTH EVERY MORNING BEFORE BREAKFAST  isosorbide mononitrate 30 MG 24 hr tablet Commonly known as: IMDUR TAKE ONE TABLET BY MOUTH DAILY   levothyroxine 75 MCG tablet Commonly known as: SYNTHROID Take 75 mcg by mouth daily before breakfast.   metFORMIN 500 MG tablet Commonly known as: GLUCOPHAGE 1 tablet at dinner for 7 days, then 1 twice daily, after 2 weeks 1 in a.m. and 2 at dinner   methotrexate 2.5 MG tablet Commonly known as: RHEUMATREX Take 7.5 mg by mouth every Monday. For rash   metoprolol tartrate 25 MG tablet Commonly known as: LOPRESSOR TAKE ONE TABLET BY MOUTH TWICE A DAY   OLANZapine 5 MG tablet Commonly known as: ZYPREXA Take 5 mg by mouth at bedtime.   ondansetron 8 MG tablet Commonly known as: Zofran Take 1 tablet (8 mg total) by mouth every 8 (eight) hours as needed for nausea.   OneTouch Delica Lancets 01U Misc USE LANCET TO TEST FIVE TIMES A DAY   OneTouch Verio test strip Generic drug: glucose blood USE TO TEST BLOOD SUGAR THREE TIMES A DAY   pravastatin 40 MG tablet Commonly known as: PRAVACHOL Take 1 tablet (40 mg total) by mouth daily.   Tyler Aas FlexTouch 100 UNIT/ML Sopn  FlexTouch Pen Generic drug: insulin degludec INJECT 14 UNITS UNDER THE SKIN ONCE DAILY   V-Go 40 Kit USE ONCE DAILY AS DIRECTED   Victoza 18 MG/3ML Sopn Generic drug: liraglutide DIAL AND INJECT UNDER THE SKIN 1.8 MG DAILY Please provide 90 day supply.   Vimpat 50 MG Tabs tablet Generic drug: lacosamide Take 50 mg by mouth 2 (two) times daily.       Allergies:  Allergies  Allergen Reactions  . Flagyl [Metronidazole Hcl] Itching and Rash  . Ciprofloxacin Itching and Rash  . Metformin And Related Rash  . Milk-Related Compounds Other (See Comments)    Stomach pains  . Other Other (See Comments)    [place] nuts cause severe facial redness    Past Medical History:  Diagnosis Date  . Bipolar disorder ([place])   . Depression    Bipolar  . GERD (gastroesophageal reflux disease)   . History of hiatal hernia   . History of kidney stones   . History of stomach ulcers 1970s   "bleeding"  . Hyperlipidemia   . Hypertension   . Hypothyroidism   . Ischemic colitis, enteritis, or enterocolitis ([place])   . OSA on CPAP   . Pericarditis 05/06/2017  . Pneumonia    "several times" (06/23/2017)  . Thyroid disease   . TIA (transient ischemic attack) 2011 X 2  . Type II diabetes mellitus ([place])   . Urinary bladder incontinence   . Vertigo     Past Surgical History:  Procedure Laterality Date  . APPENDECTOMY    . CARDIAC CATHETERIZATION N/A 12/24/2014   Procedure: Right/Left Heart Cath and Coronary Angiography;  Surgeon: Sanda Klein, MD;  Location: [facility];  Service: Cardiovascular;  Laterality: N/A;  . CATARACT EXTRACTION W/ INTRAOCULAR LENS  IMPLANT, BILATERAL Bilateral   . EXCISIONAL HEMORRHOIDECTOMY    . EYE SURGERY    . FRACTURE SURGERY    . IR THORACENTESIS ASP PLEURAL SPACE W/IMG GUIDE  05/13/2017  . LAPAROSCOPIC CHOLECYSTECTOMY    . LITHOTRIPSY  "several times"  . RETINAL DETACHMENT SURGERY     "think it was on the left; not sure" (06/23/2017)  . TONSILLECTOMY    .  VAGINAL HYSTERECTOMY     partial     Family History  Problem Relation  Age of Onset  . Heart disease Mother   . Stroke Father   . Parkinson's disease Father   . Cancer Sister   . Cancer Brother     Social History:  reports that she has never smoked. She has never used smokeless tobacco. She reports that she does not drink alcohol or use drugs.    Review of Systems     NEUROPATHY: She has lower leg pain and takes gabapentin twice a day as prescribed      Lipids: Last LDL from PCP was 101, followed by PCP also        Lab Results  Component Value Date   CHOL 149 03/26/2017   HDL 50 03/26/2017   LDLCALC 73 03/26/2017   TRIG 130 03/26/2017   CHOLHDL 3.0 03/26/2017       Thyroid:   Has had presumed hypothyroidism for over 20 years, has not had a change in medications in several years Recent TSH was normal, followed by PCP also   Lab Results  Component Value Date   TSH 3.59 08/16/2018   TSH 0.71 10/17/2017   TSH 1.495 06/23/2017   FREET4 1.04 05/05/2017   FREET4 0.86 01/31/2014   FREET4 0.91 08/28/2013       The blood pressure has been treated by PCP with a combination of amlodipine and Benicar as well as hydralazine and bisoprolol Not checking blood pressure at home     Physical Examination:  There were no vitals taken for this visit.     ASSESSMENT/PLAN:   Diabetes type 2, with obesity  See history of present illness for detailed discussion of his current management, blood sugar patterns and problems identified  A1c has stayed about the same With her starting Seroquel and increasing Zyprexa she has gained weight and is more insulin resistant Only in the last week or 2 her blood sugars are somewhat better with trying to improve her diet, continue walking and she has increased her Tresiba by 2 units for now Not clear what her fasting readings are as she does not check these very often Lab glucose 156 nonfasting in the morning She also has high readings  after her evening snack when she does not bolus despite eating some carbohydrates like fruit or crackers  Discussed when to check her blood sugars including fasting at least a couple of days a week She will adjust her [place] Aas only based on the fasting readings She will need to take 1-2 clicks to cover her late evening snacks and keep her sugars from being over 188 anytime She will need to cut back on carbohydrates in general Increase exercise as tolerated Continue Victoza and Invokana unchanged, recent renal function normal  Call if not getting consistent control  Follow-up in 3 months   Total telephone visit time = 11 minutes  There are no Patient Instructions on file for this visit.   Elayne Snare 12/19/2018, 10:11 AM   Note: This office note was prepared with Dragon voice recognition system technology. Any transcriptional errors that result from this process are unintentional.

## 2018-12-25 ENCOUNTER — Encounter (INDEPENDENT_AMBULATORY_CARE_PROVIDER_SITE_OTHER): Payer: Medicare Other | Admitting: Ophthalmology

## 2018-12-25 DIAGNOSIS — H43813 Vitreous degeneration, bilateral: Secondary | ICD-10-CM

## 2018-12-25 DIAGNOSIS — I10 Essential (primary) hypertension: Secondary | ICD-10-CM | POA: Diagnosis not present

## 2018-12-25 DIAGNOSIS — E113393 Type 2 diabetes mellitus with moderate nonproliferative diabetic retinopathy without macular edema, bilateral: Secondary | ICD-10-CM | POA: Diagnosis not present

## 2018-12-25 DIAGNOSIS — E11311 Type 2 diabetes mellitus with unspecified diabetic retinopathy with macular edema: Secondary | ICD-10-CM | POA: Diagnosis not present

## 2018-12-25 DIAGNOSIS — H338 Other retinal detachments: Secondary | ICD-10-CM

## 2018-12-25 DIAGNOSIS — H35033 Hypertensive retinopathy, bilateral: Secondary | ICD-10-CM

## 2018-12-25 LAB — HM DIABETES EYE EXAM

## 2018-12-28 ENCOUNTER — Other Ambulatory Visit: Payer: Self-pay | Admitting: Endocrinology

## 2019-01-06 ENCOUNTER — Other Ambulatory Visit: Payer: Self-pay | Admitting: Endocrinology

## 2019-01-15 ENCOUNTER — Other Ambulatory Visit: Payer: Self-pay | Admitting: Endocrinology

## 2019-01-16 NOTE — Progress Notes (Signed)
This encounter was created in error - please disregard.  This encounter was created in error - please disregard.

## 2019-01-19 ENCOUNTER — Other Ambulatory Visit: Payer: Self-pay | Admitting: Cardiovascular Disease

## 2019-01-31 ENCOUNTER — Telehealth: Payer: Self-pay

## 2019-01-31 ENCOUNTER — Telehealth: Payer: TRICARE For Life (TFL) | Admitting: Physician Assistant

## 2019-01-31 ENCOUNTER — Telehealth (INDEPENDENT_AMBULATORY_CARE_PROVIDER_SITE_OTHER): Payer: Medicare PPO | Admitting: Cardiology

## 2019-01-31 ENCOUNTER — Encounter: Payer: Self-pay | Admitting: Cardiology

## 2019-01-31 VITALS — Ht 63.0 in | Wt 235.0 lb

## 2019-01-31 DIAGNOSIS — E119 Type 2 diabetes mellitus without complications: Secondary | ICD-10-CM | POA: Diagnosis not present

## 2019-01-31 DIAGNOSIS — Z760 Encounter for issue of repeat prescription: Secondary | ICD-10-CM | POA: Diagnosis not present

## 2019-01-31 DIAGNOSIS — Z8679 Personal history of other diseases of the circulatory system: Secondary | ICD-10-CM

## 2019-01-31 DIAGNOSIS — I10 Essential (primary) hypertension: Secondary | ICD-10-CM

## 2019-01-31 DIAGNOSIS — Z8673 Personal history of transient ischemic attack (TIA), and cerebral infarction without residual deficits: Secondary | ICD-10-CM | POA: Diagnosis not present

## 2019-01-31 DIAGNOSIS — R0789 Other chest pain: Secondary | ICD-10-CM

## 2019-01-31 MED ORDER — FUROSEMIDE 40 MG PO TABS: 40.0000 mg | ORAL_TABLET | Freq: Every day | ORAL | 3 refills | Status: DC

## 2019-01-31 NOTE — Patient Instructions (Signed)
Medication Instructions:  STOP  Imdur *If you need a refill on your cardiac medications before your next appointment, please call your pharmacy*  Lab Work: None  If you have labs (blood work) drawn today and your tests are completely normal, you will receive your results only by: Marland Kitchen MyChart Message (if you have MyChart) OR . A paper copy in the mail If you have any lab test that is abnormal or we need to change your treatment, we will call you to review the results.  Testing/Procedures: None   Follow-Up: At [facility], you and your health needs are our priority.  As part of our continuing mission to provide you with exceptional heart care, we have created designated Provider Care Teams.  These Care Teams include your primary Cardiologist (physician) and Advanced Practice Providers (APPs -  Physician Assistants and Nurse Practitioners) who all work together to provide you with the care you need, when you need it.  Your next appointment:   12 month(s)  The format for your next appointment:   In Person  Provider:   You may see Thurmon Fair, MD or one of the following Advanced Practice Providers on your designated Care Team:    Azalee Course, PA-C  Micah Flesher, [place] or   Judy Pimple, [place]   Other Instructions

## 2019-01-31 NOTE — Telephone Encounter (Signed)
Virtual Visit Pre-Appointment Phone Call  "(Name), I am calling you today to discuss your upcoming appointment. We are currently trying to limit exposure to the virus that causes COVID-19 by seeing patients at home rather than in the office."  1. "What is the BEST phone number to call the day of the visit?" - include this in appointment notes  2. "Do you have or have access to (through a family member/friend) a smartphone with video capability that we can use for your visit?" a. If yes - list this number in appt notes as "cell" (if different from BEST phone #) and list the appointment type as a VIDEO visit in appointment notes b. If no - list the appointment type as a PHONE visit in appointment notes  3. Confirm consent - "In the setting of the current Covid19 crisis, you are scheduled for a (phone or video) visit with your provider on (date) at (time).  Just as we do with many in-office visits, in order for you to participate in this visit, we must obtain consent.  If you'd like, I can send this to your mychart (if signed up) or email for you to review.  Otherwise, I can obtain your verbal consent now.  All virtual visits are billed to your insurance company just like a normal visit would be.  By agreeing to a virtual visit, we'd like you to understand that the technology does not allow for your provider to perform an examination, and thus may limit your provider's ability to fully assess your condition. If your provider identifies any concerns that need to be evaluated in person, we will make arrangements to do so.  Finally, though the technology is pretty good, we cannot assure that it will always work on either your or our end, and in the setting of a video visit, we may have to convert it to a phone-only visit.  In either situation, we cannot ensure that we have a secure connection.  Are you willing to proceed?" STAFF: Did the patient verbally acknowledge consent to telehealth visit? Document  YES/NO here: yes  4. Advise patient to be prepared - "Two hours prior to your appointment, go ahead and check your blood pressure, pulse, oxygen saturation, and your weight (if you have the equipment to check those) and write them all down. When your visit starts, your provider will ask you for this information. If you have an Apple Watch or Kardia device, please plan to have heart rate information ready on the day of your appointment. Please have a pen and paper handy nearby the day of the visit as well."  5. Give patient instructions for MyChart download to smartphone OR Doximity/Doxy.me as below if video visit (depending on what platform provider is using)  6. Inform patient they will receive a phone call 15 minutes prior to their appointment time (may be from unknown caller ID) so they should be prepared to answer    TELEPHONE CALL NOTE  [name] has been deemed a candidate for a follow-up tele-health visit to limit community exposure during the Covid-19 pandemic. I spoke with the patient via phone to ensure availability of phone/video source, confirm preferred email & phone number, and discuss instructions and expectations.  I reminded Harriett Rush to be prepared with any vital sign and/or heart rhythm information that could potentially be obtained via home monitoring, at the time of her visit. I reminded DELISSA SILBA to expect a phone call prior to  her visit.  Lucita Ferrara, CMA 01/31/2019 11:36 AM   INSTRUCTIONS FOR DOWNLOADING THE MYCHART APP TO SMARTPHONE  - The patient must first make sure to have activated MyChart and know their login information - If Apple, go to [employer] and type in MyChart in the search bar and download the app. If Android, ask patient to go to [employer] and type in [place] in the search bar and download the app. The app is free but as with any other app downloads, their phone may require them to verify saved payment information or  Apple/Android password.  - The patient will need to then log into the app with their MyChart username and password, and select [facility] as their healthcare provider to link the account. When it is time for your visit, go to the MyChart app, find appointments, and click Begin Video Visit. Be sure to Select Allow for your device to access the Microphone and Camera for your visit. You will then be connected, and your provider will be with you shortly.  **If they have any issues connecting, or need assistance please contact MyChart service desk [phone])**  **If using a computer, in order to ensure the best quality for their visit they will need to use either of the following Internet Browsers: [employer] or Google Chrome**  IF USING DOXIMITY or DOXY.ME - The patient will receive a link just prior to their visit by text.     FULL LENGTH CONSENT FOR TELE-HEALTH VISIT   I hereby voluntarily request, consent and authorize [facility] and its employed or contracted physicians, physician assistants, nurse practitioners or other licensed health care professionals (the Practitioner), to provide me with telemedicine health care services (the "Services") as deemed necessary by the treating Practitioner. I acknowledge and consent to receive the Services by the Practitioner via telemedicine. I understand that the telemedicine visit will involve communicating with the Practitioner through live audiovisual communication technology and the disclosure of certain medical information by electronic transmission. I acknowledge that I have been given the opportunity to request an in-person assessment or other available alternative prior to the telemedicine visit and am voluntarily participating in the telemedicine visit.  I understand that I have the right to withhold or withdraw my consent to the use of telemedicine in the course of my care at any time, without affecting my right to future care  or treatment, and that the Practitioner or I may terminate the telemedicine visit at any time. I understand that I have the right to inspect all information obtained and/or recorded in the course of the telemedicine visit and may receive copies of available information for a reasonable fee.  I understand that some of the potential risks of receiving the Services via telemedicine include:  Marland Kitchen Delay or interruption in medical evaluation due to technological equipment failure or disruption; . Information transmitted may not be sufficient (e.g. poor resolution of images) to allow for appropriate medical decision making by the Practitioner; and/or  . In rare instances, security protocols could fail, causing a breach of personal health information.  Furthermore, I acknowledge that it is my responsibility to provide information about my medical history, conditions and care that is complete and accurate to the best of my ability. I acknowledge that Practitioner's advice, recommendations, and/or decision may be based on factors not within their control, such as incomplete or inaccurate data provided by me or distortions of diagnostic images or specimens that may result from electronic transmissions.  I understand that the practice of medicine is not an exact science and that Practitioner makes no warranties or guarantees regarding treatment outcomes. I acknowledge that I will receive a copy of this consent concurrently upon execution via email to the email address I last provided but may also request a printed copy by calling the office of [facility].    I understand that my insurance will be billed for this visit.   I have read or had this consent read to me. . I understand the contents of this consent, which adequately explains the benefits and risks of the Services being provided via telemedicine.  . I have been provided ample opportunity to ask questions regarding this consent and the Services and have had  my questions answered to my satisfaction. . I give my informed consent for the services to be provided through the use of telemedicine in my medical care  By participating in this telemedicine visit I agree to the above.

## 2019-01-31 NOTE — Assessment & Plan Note (Signed)
The patient tells me her B/P has been "fine" but no actual readings

## 2019-01-31 NOTE — Telephone Encounter (Signed)
Contacted patient to discuss AVS Instructions. Gave patient [name]'s recommendations from today's virtual office visit. Informed patient that someone from the scheduling dept will be in contact with them to schedule their follow up appt. Patient voiced understanding; AVS printed and mailed to patient.    

## 2019-01-31 NOTE — Progress Notes (Signed)
Virtual Visit via Telephone Note   This visit type was conducted due to national recommendations for restrictions regarding the COVID-19 Pandemic (e.g. social distancing) in an effort to limit this patient's exposure and mitigate transmission in our community.  Due to her co-morbid illnesses, this patient is at least at moderate risk for complications without adequate follow up.  This format is felt to be most appropriate for this patient at this time.  The patient did not have access to video technology/had technical difficulties with video requiring transitioning to audio format only (telephone).  All issues noted in this document were discussed and addressed.  No physical exam could be performed with this format.  Please refer to the patient's chart for her  consent to telehealth for [facility].   Date:  01/31/2019   ID:  [name], DOB [id number], MRN [id number]  Patient Location: Home Provider Location: Office  PCP:  Mary Seashore, MD  Cardiologist:  Mary Klein, MD  Electrophysiologist:  None   Evaluation Performed:  Follow-Up Visit  Chief Complaint:  none  History of Present Illness:    [name] is a 79 y.o. female with a history of hypertension and prior TIA and diabetes.  She had viral pericarditis in 2019.  Cardiac MRI in 2019 showed normal LV function with mild LVH no evidence of late gadolinium enhancement.  Her last echo in July 2019 showed complete resolution of her pericardial effusion.  She has a history of prior normal coronaries by catheterization in 2016.  Other medical issues include sleep apnea and history of bipolar disorder.  Dr. Sallyanne [name] saw her last in August 2019.  The patient actually initiated this visit.  She wanted refills on her medications, particularly isosorbide and furosemide.  Patient denies any unusual chest pain.  When I ask about her home blood pressure she says it has been "fine".  She was unable to give me any specific  readings.  She does see her PCP every 3 months on a regular basis and he does her lab work.  Cardiac standpoint she has no complaints and actually says she has been doing quite well, she just needs those medications refilled.  The patient does not have symptoms concerning for COVID-19 infection (fever, chills, cough, or new shortness of breath).    Past Medical History:  Diagnosis Date  . Bipolar disorder ([place])   . Depression    Bipolar  . GERD (gastroesophageal reflux disease)   . History of hiatal hernia   . History of kidney stones   . History of stomach ulcers 1970s   "bleeding"  . Hyperlipidemia   . Hypertension   . Hypothyroidism   . Ischemic colitis, enteritis, or enterocolitis ([place])   . OSA on CPAP   . Pericarditis 05/06/2017  . Pneumonia    "several times" (06/23/2017)  . Thyroid disease   . TIA (transient ischemic attack) 2011 X 2  . Type II diabetes mellitus ([place])   . Urinary bladder incontinence   . Vertigo    Past Surgical History:  Procedure Laterality Date  . APPENDECTOMY    . CARDIAC CATHETERIZATION N/A 12/24/2014   Procedure: Right/Left Heart Cath and Coronary Angiography;  Surgeon: Mary Klein, MD;  Location: [facility];  Service: Cardiovascular;  Laterality: N/A;  . CATARACT EXTRACTION W/ INTRAOCULAR LENS  IMPLANT, BILATERAL Bilateral   . EXCISIONAL HEMORRHOIDECTOMY    . EYE SURGERY    . FRACTURE SURGERY    . IR THORACENTESIS ASP  PLEURAL SPACE W/IMG GUIDE  05/13/2017  . LAPAROSCOPIC CHOLECYSTECTOMY    . LITHOTRIPSY  "several times"  . RETINAL DETACHMENT SURGERY     "think it was on the left; not sure" (06/23/2017)  . TONSILLECTOMY    . VAGINAL HYSTERECTOMY     partial      Current Meds  Medication Sig  . aspirin EC 81 MG EC tablet Take 1 tablet (81 mg total) by mouth daily.  . betamethasone dipropionate (DIPROLENE) 0.05 % cream Apply 1 application topically 2 (two) times daily as needed (rash).   . carbamazepine (CARBATROL) 200 MG 12 hr  capsule Take 200 mg by mouth 2 (two) times daily.   . clobetasol (TEMOVATE) 0.05 % GEL Apply 1 application topically 2 (two) times daily as needed (rash).   Marland Kitchen diltiazem (CARDIZEM CD) 360 MG 24 hr capsule Take 1 capsule (360 mg total) by mouth at bedtime.  Marland Kitchen esomeprazole (NEXIUM) 40 MG capsule Take 1 capsule (40 mg total) by mouth daily at 12 noon.  . folic acid (FOLVITE) 1 MG tablet Take 1 mg by mouth daily.  . furosemide (LASIX) 40 MG tablet TAKE ONE TABLET BY MOUTH DAILY  . insulin degludec (TRESIBA FLEXTOUCH) 100 UNIT/ML SOPN FlexTouch Pen INJECT 14 UNITS UNDER THE SKIN ONCE DAILY  . Insulin Disposable Pump (V-GO 40) KIT USE ONCE DAILY AS DIRECTED  . insulin lispro (HUMALOG) 100 UNIT/ML injection USE TO FILL V-GO PUMP WIOTH 80 UNITS DAILY  . Insulin Pen Needle (BD PEN NEEDLE NANO U/F) 32G X 4 MM MISC USE 8 PEN NEEDLES PER DAY  . Insulin Pen Needle (COMFORT EZ PEN NEEDLES) 33G X 5 MM MISC 1 each by Does not apply route daily. Use with [place] pen  . Insulin Pen Needle 32G X 5 MM MISC 1 each by Does not apply route daily. Use with [place] Aas pen  . isosorbide mononitrate (IMDUR) 30 MG 24 hr tablet TAKE ONE TABLET BY MOUTH DAILY  . levothyroxine (SYNTHROID, LEVOTHROID) 75 MCG tablet Take 75 mcg by mouth daily before breakfast.   . liraglutide (VICTOZA) 18 MG/3ML SOPN DIAL AND INJECT UNDER THE SKIN 1.8 MG DAILY Please provide 90 day supply.  . methotrexate (RHEUMATREX) 2.5 MG tablet Take 7.5 mg by mouth every Monday. For rash  . metoprolol tartrate (LOPRESSOR) 25 MG tablet TAKE ONE TABLET BY MOUTH TWICE A DAY  . OLANZapine (ZYPREXA) 20 MG tablet Take 20 mg by mouth at bedtime.  Marland Kitchen OLANZapine (ZYPREXA) 7.5 MG tablet Take 35 mg by mouth at bedtime.   . ondansetron (ZOFRAN) 8 MG tablet Take 1 tablet (8 mg total) by mouth every 8 (eight) hours as needed for nausea.  [name] DELICA LANCETS 46N MISC USE LANCET TO TEST FIVE TIMES A DAY  . ONETOUCH VERIO test strip USE TO TEST BLOOD SUGAR THREE TIMES A  DAY  . pravastatin (PRAVACHOL) 40 MG tablet Take 1 tablet (40 mg total) by mouth daily.  Marland Kitchen VIMPAT 50 MG TABS tablet Take 50 mg by mouth 2 (two) times daily.      Allergies:   Flagyl [metronidazole hcl], Ciprofloxacin, Metformin and related, Milk-related compounds, and Other   Social History   Tobacco Use  . Smoking status: Never Smoker  . Smokeless tobacco: Never Used  Substance Use Topics  . Alcohol use: Never  . Drug use: Never     Family Hx: The patient's family history includes Cancer in her brother and sister; Heart disease in her mother; Parkinson's disease in her  father; Stroke in her father.  ROS:   Please see the history of present illness.    All other systems reviewed and are negative.   Prior CV studies:   The following studies were reviewed today: Echo July 2019  Labs/Other Tests and Data Reviewed:    EKG:  No ECG reviewed.  Recent Labs: 08/16/2018: TSH 3.59 12/18/2018: ALT 33; BUN 14; Creatinine, Ser 0.88; Potassium 4.3; Sodium 133   Recent Lipid Panel Lab Results  Component Value Date/Time   CHOL 149 03/26/2017 05:10 PM   TRIG 130 03/26/2017 05:10 PM   HDL 50 03/26/2017 05:10 PM   CHOLHDL 3.0 03/26/2017 05:10 PM   LDLCALC 73 03/26/2017 05:10 PM    Wt Readings from Last 3 Encounters:  01/31/19 235 lb (106.6 kg)  12/23/17 221 lb 3.2 oz (100.3 kg)  10/19/17 214 lb (97.1 kg)     Objective:    Vital Signs:  Ht 5' 3"  (1.6 m)   Wt 235 lb (106.6 kg)   BMI 41.63 kg/m    VITAL SIGNS:  reviewed  ASSESSMENT & PLAN:    H/O viral pericarditis- Echo July 2019 showed resolution of pericardial effusion.  HTN- Patient says her b/P has been fine- but no actual readings.   Normal coronaries- 2016  H/O TIA- MRI 2016 and 2019- SVD  NIDDM- Dr Dwyane Dee follows  Plan: I suggested she could stop the isosorbide.  I did refill the furosemide since that was ordered by Dr. Shirlee More.  Since we did renew her medications we will need to see her back in a  year. COVID-19 Education: The signs and symptoms of COVID-19 were discussed with the patient and how to seek care for testing (follow up with PCP or arrange E-visit).  The importance of social distancing was discussed today.  Time:   Today, I have spent 10 minutes with the patient with telehealth technology discussing the above problems.     Medication Adjustments/Labs and Tests Ordered: Current medicines are reviewed at length with the patient today.  Concerns regarding medicines are outlined above.   Tests Ordered: No orders of the defined types were placed in this encounter.   Medication Changes: No orders of the defined types were placed in this encounter.   Follow Up:  Either In Person or Virtual Dr [name] or his APP in one year  Signed, Susy Manor  01/31/2019 11:56 AM    [place]

## 2019-01-31 NOTE — Assessment & Plan Note (Signed)
Normal coronaries 2016 Normal LVF by cardiac MRI 2019 I suggested she could try coming off her Imdur

## 2019-02-28 ENCOUNTER — Other Ambulatory Visit: Payer: Self-pay | Admitting: Endocrinology

## 2019-03-14 ENCOUNTER — Other Ambulatory Visit: Payer: Self-pay

## 2019-03-14 ENCOUNTER — Other Ambulatory Visit (INDEPENDENT_AMBULATORY_CARE_PROVIDER_SITE_OTHER): Payer: Medicare PPO

## 2019-03-14 DIAGNOSIS — E1165 Type 2 diabetes mellitus with hyperglycemia: Secondary | ICD-10-CM

## 2019-03-14 DIAGNOSIS — Z794 Long term (current) use of insulin: Secondary | ICD-10-CM

## 2019-03-14 LAB — BASIC METABOLIC PANEL
BUN: 18 mg/dL (ref 6–23)
CO2: 27 mEq/L (ref 19–32)
Calcium: 9.5 mg/dL (ref 8.4–10.5)
Chloride: 97 mEq/L (ref 96–112)
Creatinine, Ser: 0.9 mg/dL (ref 0.40–1.20)
GFR: 60.58 mL/min (ref >60.00)
Glucose, Bld: 144 mg/dL — ABNORMAL HIGH (ref 70–99)
Potassium: 3.9 mEq/L (ref 3.5–5.1)
Sodium: 135 mEq/L (ref 135–145)

## 2019-03-15 LAB — HEMOGLOBIN A1C: Hgb A1c MFr Bld: 7.7 % — ABNORMAL HIGH (ref 4.6–6.5)

## 2019-03-22 ENCOUNTER — Ambulatory Visit (INDEPENDENT_AMBULATORY_CARE_PROVIDER_SITE_OTHER): Payer: Medicare PPO | Admitting: Endocrinology

## 2019-03-22 ENCOUNTER — Encounter: Payer: Self-pay | Admitting: Endocrinology

## 2019-03-22 ENCOUNTER — Other Ambulatory Visit: Payer: Self-pay

## 2019-03-22 DIAGNOSIS — E1165 Type 2 diabetes mellitus with hyperglycemia: Secondary | ICD-10-CM | POA: Diagnosis not present

## 2019-03-22 DIAGNOSIS — Z794 Long term (current) use of insulin: Secondary | ICD-10-CM | POA: Diagnosis not present

## 2019-03-22 NOTE — Progress Notes (Signed)
Patient ID: [name], female   DOB: May 23, 1940, 79 y.o.   MRN: [id number]    Today's office visit was provided via telemedicine using audio phone call Explained to the patient and the the limitations of evaluation and management by telemedicine and the availability of in person appointments.  The patient understood the limitations and agreed to proceed. Patient also understood that the telehealth visit is billable. . Location of the patient: Home . Location of the provider: Office Only the patient and myself were participating in the encounter    Reason for Appointment: Followup for Type 2 Diabetes    History of Present Illness:          Diagnosis: Type 2 diabetes mellitus, date of diagnosis:2011       Past history: Since she was apparently intolerant to metformin she was treated with Onglyza until about 2014  At that time because of increasing A1c of about 8% she was started on Levemir 15 units and this was aggressively increased Onglyza was continued She has not tried any other treatments for diabetes Her A1c has been 10-10.5 in 2015 In 3/15 she was told to start small doses of NovoLog at lunch and supper and add 15 units of Levemir at night also To control  hyperglycemia with her basal bolus insulin regimen she was given Victoza in addition on her initial consultation in 5/15 She has been on Victoza since 8/15  She has been on the V-go pump since 09/2014  Recent history:   INSULIN regimen :  V-go pump 40 unit basal, boluses at meal times: 10-10-/12 units TRESIBA 14 units daily   Noninsulin hypoglycemic drugs the patient is taking are: Invokana 300 mg, on VICTOZA 1.8 mg  Her A1c is 7.7 and the same   Current management, blood sugar patterns and problems:  She still appears to have relatively higher fasting readings at home  Did not increase her Tyler Aas any further since her last visit despite high readings  She appears to have somewhat inconsistent  patterns and about 2 weeks ago she had consistently high readings over 180 throughout the day  Blood sugar monitoring is very sporadic  Has only 2 readings after dinner recently and she thinks that blood sugars are probably slightly higher after dinner  Her Zyprexa dose has been reduced down to 10 mg from 20 mg  However blood sugars do not seem to be improving  She is usually using up all the available bolus insulin for her V-go pump  May take up to 7 clicks at dinnertime has not checked her blood sugars much only about once a day on an average  However she does still get excessively hungry  Has been able to keep her weight level, now 234  She is trying to walk 3 to 5 days a week as before  No hypoglycemia with lowest blood sugar 96   Meals: 3 meals per day. Bfst oatmeal or egg or cereal at 8-9 am, dinner 6-7 pm.; no hs snacks   Exercise: Walks 20 min 3-4  days per week  Side effects from medications have been: Metformin: rash  Glucose monitoring:  done 1 time a day or less, mostly in the morning       Glucometer:  One Touch.       Blood Glucose readings from home glucose meter download through 03/14/2019   PRE-MEAL Fasting Lunch Dinner Bedtime Overall  Glucose range:  151-221  122, 154  96 241  120, 227  96-241  Mean/median:  171    168   POST-MEAL PC Breakfast PC Lunch PC Dinner  Glucose range:   109-211   Mean/median:      Previous readings:  PRE-MEAL Fasting Lunch Dinner  overnight Overall  Glucose range:  148   157  146, 224   Mean/median:      153   POST-MEAL PC Breakfast PC Lunch PC Dinner  Glucose range:  123-149  94-211  108, 170  Mean/median:       Glycemic control:    Lab Results  Component Value Date   HGBA1C 7.7 (H) 03/14/2019   HGBA1C 7.7 (H) 12/18/2018   HGBA1C 7.8 (H) 08/16/2018   Lab Results  Component Value Date   MICROALBUR <0.7 08/16/2018   LDLCALC 73 03/26/2017   CREATININE 0.90 03/14/2019    Self-care: The diet that the patient  has been following is: tries to limit fats   Breakfast: She will add [place] bacon or sausage for protein, usually getting eggs also               Dietician visit: Most recent: 2/19   CDE visit: 9/16    Weight history:  Wt Readings from Last 3 Encounters:  01/31/19 235 lb (106.6 kg)  12/23/17 221 lb 3.2 oz (100.3 kg)  10/19/17 214 lb (97.1 kg)    Other active problems: See review of systems    Allergies as of 03/22/2019      Reactions   Flagyl [metronidazole Hcl] Itching, Rash   Ciprofloxacin Itching, Rash   Metformin And Related Rash   Milk-related Compounds Other (See Comments)   Stomach pains   Other Other (See Comments)   [place] nuts cause severe facial redness      Medication List       Accurate as of March 22, 2019  9:20 AM. If you have any questions, ask your nurse or doctor.        aspirin 81 MG EC tablet Take 1 tablet (81 mg total) by mouth daily.   betamethasone dipropionate 0.05 % cream Apply 1 application topically 2 (two) times daily as needed (rash).   carbamazepine 200 MG 12 hr capsule Commonly known as: CARBATROL Take 200 mg by mouth 2 (two) times daily.   clobetasol 0.05 % Gel Commonly known as: TEMOVATE Apply 1 application topically 2 (two) times daily as needed (rash).   diltiazem 360 MG 24 hr capsule Commonly known as: CARDIZEM CD Take 1 capsule (360 mg total) by mouth at bedtime.   donepezil 10 MG tablet Commonly known as: ARICEPT Take 1 tablet by mouth at bedtime.   esomeprazole 40 MG capsule Commonly known as: NexIUM Take 1 capsule (40 mg total) by mouth daily at 12 noon.   folic acid 1 MG tablet Commonly known as: FOLVITE Take 1 mg by mouth daily.   furosemide 40 MG tablet Commonly known as: LASIX Take 1 tablet (40 mg total) by mouth daily.   gabapentin 300 MG capsule Commonly known as: NEURONTIN Take 300 mg by mouth 2 (two) times daily.   insulin lispro 100 UNIT/ML injection Commonly known as: HumaLOG USE TO FILL  V-GO PUMP WIOTH 80 UNITS DAILY   Insulin Pen Needle 33G X 5 MM Misc Commonly known as: Comfort EZ Pen Needles 1 each by Does not apply route daily. Use with Tresiba pen   Insulin Pen Needle 32G X 5 MM Misc 1 each by Does not apply route daily. Use with  Tyler Aas pen   BD Pen Needle Nano U/F 32G X 4 MM Misc Generic drug: Insulin Pen Needle USE 8 PEN NEEDLES PER DAY   Invokana 300 MG Tabs tablet Generic drug: canagliflozin TAKE ONE TABLET BY MOUTH EVERY MORNING BEFORE BREAKFAST   levothyroxine 75 MCG tablet Commonly known as: SYNTHROID Take 75 mcg by mouth daily before breakfast.   methotrexate 2.5 MG tablet Commonly known as: RHEUMATREX Take 7.5 mg by mouth every Monday. For rash   metoprolol tartrate 25 MG tablet Commonly known as: LOPRESSOR TAKE ONE TABLET BY MOUTH TWICE A DAY   OLANZapine 7.5 MG tablet Commonly known as: ZYPREXA Take 35 mg by mouth at bedtime.   OLANZapine 20 MG tablet Commonly known as: ZYPREXA Take 20 mg by mouth at bedtime.   ondansetron 8 MG tablet Commonly known as: Zofran Take 1 tablet (8 mg total) by mouth every 8 (eight) hours as needed for nausea.   OneTouch Delica Lancets 17B Misc USE LANCET TO TEST FIVE TIMES A DAY   OneTouch Verio test strip Generic drug: glucose blood USE TO TEST BLOOD SUGAR THREE TIMES A DAY   pravastatin 40 MG tablet Commonly known as: PRAVACHOL Take 1 tablet (40 mg total) by mouth daily.   QUEtiapine 25 MG tablet Commonly known as: SEROQUEL Take 4 tablets by mouth at bedtime.   Tyler Aas FlexTouch 100 UNIT/ML FlexTouch Pen Generic drug: insulin degludec INJECT 14 UNITS UNDER THE SKIN ONCE DAILY   V-Go 40 Kit USE ONCE DAILY AS DIRECTED   Victoza 18 MG/3ML Sopn Generic drug: liraglutide DIAL AND INJECT UNDER THE SKIN 1.8 MG DAILY Please provide 90 day supply.   Vimpat 50 MG Tabs tablet Generic drug: lacosamide Take 50 mg by mouth 2 (two) times daily.       Allergies:  Allergies  Allergen  Reactions  . Flagyl [Metronidazole Hcl] Itching and Rash  . Ciprofloxacin Itching and Rash  . Metformin And Related Rash  . Milk-Related Compounds Other (See Comments)    Stomach pains  . Other Other (See Comments)    [place] nuts cause severe facial redness    Past Medical History:  Diagnosis Date  . Bipolar disorder ([place])   . Depression    Bipolar  . GERD (gastroesophageal reflux disease)   . History of hiatal hernia   . History of kidney stones   . History of stomach ulcers 1970s   "bleeding"  . Hyperlipidemia   . Hypertension   . Hypothyroidism   . Ischemic colitis, enteritis, or enterocolitis ([place])   . OSA on CPAP   . Pericarditis 05/06/2017  . Pneumonia    "several times" (06/23/2017)  . Thyroid disease   . TIA (transient ischemic attack) 2011 X 2  . Type II diabetes mellitus ([place])   . Urinary bladder incontinence   . Vertigo     Past Surgical History:  Procedure Laterality Date  . APPENDECTOMY    . CARDIAC CATHETERIZATION N/A 12/24/2014   Procedure: Right/Left Heart Cath and Coronary Angiography;  Surgeon: Sanda Klein, MD;  Location: [facility];  Service: Cardiovascular;  Laterality: N/A;  . CATARACT EXTRACTION W/ INTRAOCULAR LENS  IMPLANT, BILATERAL Bilateral   . EXCISIONAL HEMORRHOIDECTOMY    . EYE SURGERY    . FRACTURE SURGERY    . IR THORACENTESIS ASP PLEURAL SPACE W/IMG GUIDE  05/13/2017  . LAPAROSCOPIC CHOLECYSTECTOMY    . LITHOTRIPSY  "several times"  . RETINAL DETACHMENT SURGERY     "think it was on  the left; not sure" (06/23/2017)  . TONSILLECTOMY    . VAGINAL HYSTERECTOMY     partial     Family History  Problem Relation Age of Onset  . Heart disease Mother   . Stroke Father   . Parkinson's disease Father   . Cancer Sister   . Cancer Brother     Social History:  reports that she has never smoked. She has never used smokeless tobacco. She reports that she does not drink alcohol or use drugs.    Review of Systems    NEUROPATHY:   Better now and was previously taking gabapentin twice a day However her dermatologist wants her to take gabapentin for her rash      Lipids: Last LDL from PCP was 79, followed by PCP       Lab Results  Component Value Date   CHOL 149 03/26/2017   HDL 50 03/26/2017   LDLCALC 73 03/26/2017   TRIG 130 03/26/2017   CHOLHDL 3.0 03/26/2017       Thyroid:   Has had presumed hypothyroidism for over 20 years, has not had a change in her 75 mcg levothyroxine dose in several years Last TSH was normal at 1.6, followed by PCP   Lab Results  Component Value Date   TSH 3.59 08/16/2018   TSH 0.71 10/17/2017   TSH 1.495 06/23/2017   FREET4 1.04 05/05/2017   FREET4 0.86 01/31/2014   FREET4 0.91 08/28/2013       The blood pressure has been managed by PCP with a combination of amlodipine and Benicar, hydralazine and bisoprolol Most recent blood pressure 128/80     BP Readings from Last 3 Encounters:  12/23/17 110/76  10/19/17 126/72  09/09/17 121/62     Physical Examination:  There were no vitals taken for this visit.     ASSESSMENT/PLAN:   Diabetes type 2, with obesity  See history of present illness for detailed discussion of his current management, blood sugar patterns and problems identified  A1c has stayed about the same at 7.7  She is still requiring higher doses of basal insulin; recent average blood sugar in the mornings about 170 Also has sporadic high readings throughout the day Even with the V-go pump she is needing supplemental Tyler Aas This is despite continuing Victoza and Invokana maximum dose Again likely has difficulty because of being on Zyprexa   Tresiba 17 units daily Reminded her to try and adjust his suppertime 2 units if blood sugars are either above 130 consistently or below 90 She will try to check blood sugars after meals more often to help her adjust her bolus clicks To change pump at the same time mid afternoon on a daily routine To click  before eating Stay on same dose of Invokana since recent renal function normal  Follow-up in 3 months   Total telephone visit time = 8 minutes  There are no Patient Instructions on file for this visit.   Elayne Snare 03/22/2019, 9:20 AM   Note: This office note was prepared with Dragon voice recognition system technology. Any transcriptional errors that result from this process are unintentional.

## 2019-03-28 ENCOUNTER — Other Ambulatory Visit: Payer: Self-pay | Admitting: Endocrinology

## 2019-04-12 ENCOUNTER — Other Ambulatory Visit: Payer: Self-pay | Admitting: Endocrinology

## 2019-05-02 ENCOUNTER — Other Ambulatory Visit: Payer: Self-pay | Admitting: Endocrinology

## 2019-05-08 ENCOUNTER — Other Ambulatory Visit: Payer: Self-pay | Admitting: Endocrinology

## 2019-05-10 ENCOUNTER — Telehealth: Payer: Self-pay | Admitting: Cardiovascular Disease

## 2019-05-16 ENCOUNTER — Other Ambulatory Visit: Payer: Self-pay

## 2019-05-16 ENCOUNTER — Telehealth: Payer: Self-pay

## 2019-05-16 MED ORDER — FARXIGA 10 MG PO TABS: 10.0000 mg | ORAL_TABLET | Freq: Every day | ORAL | 2 refills | Status: DC

## 2019-05-16 NOTE — Telephone Encounter (Signed)
Received fax from patients insurance company stating that PA for [name] is required. Would you like to complete PA or change medication?

## 2019-05-16 NOTE — Telephone Encounter (Signed)
*  STAT* If patient is at the pharmacy, call can be transferred to refill team.    1. Which medications need to be refilled? (please list name of each medication and dose if known)  Isosorbide Mononitrate 30 MG   2. Which pharmacy/location (including street and city if local pharmacy) is medication to be sent to? [facility] [address] [place] - [address]  3. Do they need a 30 day or 90 day supply? 90 day  Patient had Televisit with Corine Shelter on 02/08/2019

## 2019-05-16 NOTE — Telephone Encounter (Signed)
Called pt and notified her of this medication change. Pt verbalized understanding.

## 2019-05-16 NOTE — Telephone Encounter (Signed)
New Rx sent.

## 2019-05-16 NOTE — Telephone Encounter (Signed)
Try sending Farxiga 10 mg

## 2019-05-23 ENCOUNTER — Other Ambulatory Visit: Payer: Self-pay | Admitting: Physician Assistant

## 2019-05-27 ENCOUNTER — Other Ambulatory Visit: Payer: Self-pay | Admitting: Physician Assistant

## 2019-06-10 ENCOUNTER — Other Ambulatory Visit: Payer: Self-pay | Admitting: Cardiovascular Disease

## 2019-06-10 ENCOUNTER — Other Ambulatory Visit: Payer: Self-pay | Admitting: Endocrinology

## 2019-06-11 ENCOUNTER — Other Ambulatory Visit: Payer: Self-pay | Admitting: Endocrinology

## 2019-06-25 ENCOUNTER — Other Ambulatory Visit (INDEPENDENT_AMBULATORY_CARE_PROVIDER_SITE_OTHER): Payer: Medicare PPO

## 2019-06-25 ENCOUNTER — Other Ambulatory Visit: Payer: Self-pay

## 2019-06-25 DIAGNOSIS — Z794 Long term (current) use of insulin: Secondary | ICD-10-CM | POA: Diagnosis not present

## 2019-06-25 DIAGNOSIS — E1165 Type 2 diabetes mellitus with hyperglycemia: Secondary | ICD-10-CM

## 2019-06-25 LAB — BASIC METABOLIC PANEL
BUN: 12 mg/dL (ref 6–23)
CO2: 30 mEq/L (ref 19–32)
Calcium: 9.1 mg/dL (ref 8.4–10.5)
Chloride: 91 mEq/L — ABNORMAL LOW (ref 96–112)
Creatinine, Ser: 0.91 mg/dL (ref 0.40–1.20)
GFR: 59.77 mL/min — ABNORMAL LOW (ref >60.00)
Glucose, Bld: 129 mg/dL — ABNORMAL HIGH (ref 70–99)
Potassium: 4.4 mEq/L (ref 3.5–5.1)
Sodium: 127 mEq/L — ABNORMAL LOW (ref 135–145)

## 2019-06-25 LAB — HEMOGLOBIN A1C: Hgb A1c MFr Bld: 7.7 % — ABNORMAL HIGH (ref 4.6–6.5)

## 2019-06-26 ENCOUNTER — Telehealth: Payer: Self-pay | Admitting: Endocrinology

## 2019-06-26 ENCOUNTER — Other Ambulatory Visit: Payer: Self-pay

## 2019-06-26 MED ORDER — INSULIN ASPART 100 UNIT/ML ~~LOC~~ SOLN: 80.0000 [IU] | SUBCUTANEOUS | 0 refills | Status: DC

## 2019-06-26 NOTE — Telephone Encounter (Signed)
Patient called re: Patient's insurance no longer covers Humalog. Patient requests a new RX for an alternative to Humalog be sent with a 90 day supply to:  [facility] [address] [place] - [address] [place] Phone:  [phone]  Fax:  [phone]

## 2019-06-26 NOTE — Telephone Encounter (Signed)
Rx sent for Novolog.  

## 2019-06-27 ENCOUNTER — Telehealth (INDEPENDENT_AMBULATORY_CARE_PROVIDER_SITE_OTHER): Payer: Medicare PPO | Admitting: Endocrinology

## 2019-06-27 ENCOUNTER — Encounter: Payer: Self-pay | Admitting: Endocrinology

## 2019-06-27 ENCOUNTER — Other Ambulatory Visit: Payer: Self-pay

## 2019-06-27 DIAGNOSIS — Z794 Long term (current) use of insulin: Secondary | ICD-10-CM

## 2019-06-27 DIAGNOSIS — E1165 Type 2 diabetes mellitus with hyperglycemia: Secondary | ICD-10-CM

## 2019-06-27 DIAGNOSIS — E063 Autoimmune thyroiditis: Secondary | ICD-10-CM

## 2019-06-27 DIAGNOSIS — E871 Hypo-osmolality and hyponatremia: Secondary | ICD-10-CM

## 2019-06-27 NOTE — Progress Notes (Signed)
Patient ID: [name], female   DOB: [birth year], 79 y.o.   MRN: [id number]    Today's office visit was provided via telemedicine using audio phone call Explained to the patient and the the limitations of evaluation and management by telemedicine and the availability of in person appointments.  The patient understood the limitations and agreed to proceed. Patient also understood that the telehealth visit is billable. . Location of the patient: Home . Location of the provider: Office Only the patient and myself were participating in the encounter    Reason for Appointment: Followup for Type 2 Diabetes    History of Present Illness:          Diagnosis: Type 2 diabetes mellitus, date of diagnosis:2011       Past history: Since she was apparently intolerant to metformin she was treated with Onglyza until about 2014  At that time because of increasing A1c of about 8% she was started on Levemir 15 units and this was aggressively increased Onglyza was continued She has not tried any other treatments for diabetes Her A1c has been 10-10.5 in 2015 In 3/15 she was told to start small doses of NovoLog at lunch and supper and add 15 units of Levemir at night also To control  hyperglycemia with her basal bolus insulin regimen she was given Victoza in addition on her initial consultation in 5/15 She has been on Victoza since 8/15  She has been on the V-go pump since 09/2014  Recent history:   INSULIN regimen :  V-go pump 40 unit basal, boluses at meal times: 10-10-/12 units TRESIBA 17 units daily   Noninsulin hypoglycemic drugs the patient is taking are: Farxiga 10 mg, daily VICTOZA 1.8 mg  Her A1c is 7.7 and the same as the last 2 times   Current management, blood sugar patterns and problems:  She confirms that she is changing her pump daily around the same time around midday  Despite increasing her Tresiba by 3 units her morning sugars are averaging the same as  before  However on her labs her blood sugar in the morning was only 129  Blood sugars later in the day are usually fairly good with only 1 reading above target of 181  She is usually consistent with taking her boluses at mealtimes  She is not taking [place] because insurance will not cover Invokana  She does not think she has changed her diet but she thinks she has lost 6 pounds  She is trying to walk 15 to 20 minutes daily   Meals: 3 meals per day.  Breakfast: Oatmeal or egg or cereal at 8-9 am, dinner 5-6 pm.;   Night snacks will be fruit or peanut butter crackers    Side effects from medications have been: Metformin: rash  Glucose monitoring:  done 1 time a day or less, mostly in the morning       Glucometer:  One Touch.       Blood Glucose readings from home glucose meter download for the last 2 weeks   PRE-MEAL  morning Lunch Dinner Bedtime Overall  Glucose range:  157-205  103-171     Mean/median:  178  154    162   POST-MEAL PC Breakfast PC Lunch PC Dinner  Glucose range:    114-181  Mean/median:    151    PREVIOUS readings:  PRE-MEAL Fasting Lunch Dinner Bedtime Overall  Glucose range:  151-221  122, 154  96 241  120, 227  96-241  Mean/median:  171    168   POST-MEAL PC Breakfast PC Lunch PC Dinner  Glucose range:   109-211   Mean/median:       Glycemic control:    Lab Results  Component Value Date   HGBA1C 7.7 (H) 06/25/2019   HGBA1C 7.7 (H) 03/14/2019   HGBA1C 7.7 (H) 12/18/2018   Lab Results  Component Value Date   MICROALBUR <0.7 08/16/2018   LDLCALC 73 03/26/2017   CREATININE 0.91 06/25/2019    Self-care: The diet that the patient has been following is: tries to limit fats   Breakfast: She will add [place] bacon or sausage for protein, usually getting eggs also               Dietician visit: Most recent: 2/19   CDE visit: 9/16    Weight history:  Wt Readings from Last 3 Encounters:  01/31/19 235 lb (106.6 kg)  12/23/17 221 lb 3.2  oz (100.3 kg)  10/19/17 214 lb (97.1 kg)    Other active problems: See review of systems    Allergies as of 06/27/2019      Reactions   Flagyl [metronidazole Hcl] Itching, Rash   Ciprofloxacin Itching, Rash   Metformin And Related Rash   Milk-related Compounds Other (See Comments)   Stomach pains   Other Other (See Comments)   [place] nuts cause severe facial redness      Medication List       Accurate as of June 27, 2019  9:31 AM. If you have any questions, ask your nurse or doctor.        aspirin 81 MG EC tablet Take 1 tablet (81 mg total) by mouth daily.   betamethasone dipropionate 0.05 % cream Apply 1 application topically 2 (two) times daily as needed (rash).   carbamazepine 200 MG 12 hr capsule Commonly known as: CARBATROL Take 200 mg by mouth 2 (two) times daily.   clobetasol 0.05 % Gel Commonly known as: TEMOVATE Apply 1 application topically 2 (two) times daily as needed (rash).   diltiazem 360 MG 24 hr capsule Commonly known as: CARDIZEM CD Take 1 capsule (360 mg total) by mouth at bedtime.   donepezil 10 MG tablet Commonly known as: ARICEPT Take 1 tablet by mouth at bedtime.   esomeprazole 40 MG capsule Commonly known as: NexIUM Take 1 capsule (40 mg total) by mouth daily at 12 noon.   Farxiga 10 MG Tabs tablet Generic drug: dapagliflozin propanediol Take 10 mg by mouth daily. Replaces Invokana   folic acid 1 MG tablet Commonly known as: FOLVITE Take 1 mg by mouth daily.   furosemide 40 MG tablet Commonly known as: LASIX Take 1 tablet (40 mg total) by mouth daily.   gabapentin 300 MG capsule Commonly known as: NEURONTIN Take 300 mg by mouth 2 (two) times daily.   insulin aspart 100 UNIT/ML injection Commonly known as: NovoLOG Inject 80 Units into the skin See admin instructions. USE TO FILL V-GO WITH 80 UNITS DAILY   Insulin Pen Needle 33G X 5 MM Misc Commonly known as: Comfort EZ Pen Needles 1 each by Does not apply route daily. Use  with Tresiba pen   Insulin Pen Needle 32G X 5 MM Misc 1 each by Does not apply route daily. Use with Tyler Aas pen   BD Pen Needle Nano U/F 32G X 4 MM Misc Generic drug: Insulin Pen Needle USE 8 PEN NEEDLES PER DAY   isosorbide mononitrate  30 MG 24 hr tablet Commonly known as: IMDUR TAKE ONE TABLET BY MOUTH DAILY **PATIENT NEEDS AN OFFICE VISIT FOR FUTURE REFILLS--1ST ATTEMPT**   levothyroxine 75 MCG tablet Commonly known as: SYNTHROID Take 75 mcg by mouth daily before breakfast.   methotrexate 2.5 MG tablet Commonly known as: RHEUMATREX Take 7.5 mg by mouth every Monday. For rash   metoprolol tartrate 25 MG tablet Commonly known as: LOPRESSOR TAKE ONE TABLET BY MOUTH TWICE A DAY   OLANZapine 7.5 MG tablet Commonly known as: ZYPREXA Take 35 mg by mouth at bedtime.   OLANZapine 20 MG tablet Commonly known as: ZYPREXA Take 20 mg by mouth at bedtime.   ondansetron 8 MG tablet Commonly known as: Zofran Take 1 tablet (8 mg total) by mouth every 8 (eight) hours as needed for nausea.   OneTouch Delica Lancets 78G Misc USE LANCET TO TEST FIVE TIMES A DAY   OneTouch Verio test strip Generic drug: glucose blood USE TO TEST BLOOD SUGAR THREE TIMES A DAY   pravastatin 40 MG tablet Commonly known as: PRAVACHOL Take 1 tablet (40 mg total) by mouth daily.   QUEtiapine 25 MG tablet Commonly known as: SEROQUEL Take 4 tablets by mouth at bedtime.   Tyler Aas FlexTouch 100 UNIT/ML FlexTouch Pen Generic drug: insulin degludec INJECT 14 UNITS UNDER THE SKIN ONCE DAILY   V-Go 40 Kit USE ONCE DAILY AS DIRECTED   Victoza 18 MG/3ML Sopn Generic drug: liraglutide DIAL AND INJECT UNDER THE SKIN 1.8 MG DAILY Please provide 90 day supply.   Vimpat 50 MG Tabs tablet Generic drug: lacosamide Take 50 mg by mouth 2 (two) times daily.       Allergies:  Allergies  Allergen Reactions  . Flagyl [Metronidazole Hcl] Itching and Rash  . Ciprofloxacin Itching and Rash  . Metformin And  Related Rash  . Milk-Related Compounds Other (See Comments)    Stomach pains  . Other Other (See Comments)    [place] nuts cause severe facial redness    Past Medical History:  Diagnosis Date  . Bipolar disorder ([place])   . Depression    Bipolar  . GERD (gastroesophageal reflux disease)   . History of hiatal hernia   . History of kidney stones   . History of stomach ulcers 1970s   "bleeding"  . Hyperlipidemia   . Hypertension   . Hypothyroidism   . Ischemic colitis, enteritis, or enterocolitis ([place])   . OSA on CPAP   . Pericarditis 05/06/2017  . Pneumonia    "several times" (06/23/2017)  . Thyroid disease   . TIA (transient ischemic attack) 2011 X 2  . Type II diabetes mellitus ([place])   . Urinary bladder incontinence   . Vertigo     Past Surgical History:  Procedure Laterality Date  . APPENDECTOMY    . CARDIAC CATHETERIZATION N/A 12/24/2014   Procedure: Right/Left Heart Cath and Coronary Angiography;  Surgeon: Sanda Klein, MD;  Location: [facility];  Service: Cardiovascular;  Laterality: N/A;  . CATARACT EXTRACTION W/ INTRAOCULAR LENS  IMPLANT, BILATERAL Bilateral   . EXCISIONAL HEMORRHOIDECTOMY    . EYE SURGERY    . FRACTURE SURGERY    . IR THORACENTESIS ASP PLEURAL SPACE W/IMG GUIDE  05/13/2017  . LAPAROSCOPIC CHOLECYSTECTOMY    . LITHOTRIPSY  "several times"  . RETINAL DETACHMENT SURGERY     "think it was on the left; not sure" (06/23/2017)  . TONSILLECTOMY    . VAGINAL HYSTERECTOMY     partial  Family History  Problem Relation Age of Onset  . Heart disease Mother   . Stroke Father   . Parkinson's disease Father   . Cancer Sister   . Cancer Brother     Social History:  reports that she has never smoked. She has never used smokeless tobacco. She reports that she does not drink alcohol or use drugs.    Review of Systems    NEUROPATHY:  Better now and was previously taking gabapentin twice a day However her dermatologist wants her to take  gabapentin for her rash      Lipids: Last LDL from PCP was 79, followed by PCP       Lab Results  Component Value Date   CHOL 149 03/26/2017   HDL 50 03/26/2017   LDLCALC 73 03/26/2017   TRIG 130 03/26/2017   CHOLHDL 3.0 03/26/2017       Thyroid:   Has had presumed hypothyroidism for over 20 years, has not had a change in her 75 mcg levothyroxine dose in several years Last TSH was normal at 1.6, followed by PCP   Lab Results  Component Value Date   TSH 3.59 08/16/2018   TSH 0.71 10/17/2017   TSH 1.495 06/23/2017   FREET4 1.04 05/05/2017   FREET4 0.86 01/31/2014   FREET4 0.91 08/28/2013       The blood pressure has been managed by PCP with a combination of amlodipine and , hydralazine and bisoprolol Most recent blood pressure 128/80     BP Readings from Last 3 Encounters:  12/23/17 110/76  10/19/17 126/72  09/09/17 121/62     Physical Examination:  There were no vitals taken for this visit.     ASSESSMENT/PLAN:   Diabetes type 2, with obesity  See history of present illness for detailed discussion of his current management, blood sugar patterns and problems identified  A1c has stayed about the same at 7.7  She is still having high fasting blood sugars even though she is not getting much higher readings after dinner Also not having excessive snacks at night She thinks her weight is coming down Usually consistent with all her treatment regimens including changing the V-go pump 1 time   [place] 19 units daily, go up to 21 if morning sugars are still at least 140 or more consistently Try to check readings after lunch and dinner more consistently Continue 10 mg Farxiga She will reduce her fluid intake and have water only when thirsty She will call her PCP to review her medications and review the problem with hyponatremia and make a follow-up appointment also; discussed possible symptoms of hyponatremia such as nausea and weakness or drowsiness   Follow-up  in 3 months   There are no Patient Instructions on file for this visit.  Duration of telephone encounter =11 minutes  Elayne Snare 06/27/2019, 9:31 AM   Note: This office note was prepared with Dragon voice recognition system technology. Any transcriptional errors that result from this process are unintentional.

## 2019-06-28 ENCOUNTER — Other Ambulatory Visit: Payer: Medicare PPO

## 2019-06-28 ENCOUNTER — Telehealth: Payer: Self-pay | Admitting: Endocrinology

## 2019-06-28 NOTE — Addendum Note (Signed)
Addended by: Quayshaun Hubbert G on: 06/28/2019 03:01 PM   Modules accepted: Orders  

## 2019-06-28 NOTE — Addendum Note (Signed)
Addended by: Britanni Yarde G on: 06/28/2019 03:01 PM   Modules accepted: Orders  

## 2019-06-28 NOTE — Addendum Note (Signed)
Addended by: Derinda Late on: 06/28/2019 03:01 PM   Modules accepted: Orders

## 2019-06-28 NOTE — Addendum Note (Signed)
Addended by: Kyan Yurkovich G on: 06/28/2019 03:01 PM   Modules accepted: Orders  

## 2019-06-28 NOTE — Telephone Encounter (Signed)
Patient called to request that her labs from 06/25/2019 be sent to Dr Barrie Dunker at [facility]. This was discussed during her telehealth visit with Dr Lucianne Muss on 06/27/19  Fax number [phone]

## 2019-06-28 NOTE — Telephone Encounter (Signed)
Labs have been printed and will be faxed.

## 2019-06-28 NOTE — Addendum Note (Signed)
Addended by: Collie Kittel G on: 06/28/2019 03:01 PM   Modules accepted: Orders  

## 2019-06-29 NOTE — Telephone Encounter (Signed)
Updated fax number to sent lab results - [phone]  Patient states this office closes at 12 noon today and she is sick and needs assistance from the other office before they close today it at all possible

## 2019-06-29 NOTE — Telephone Encounter (Signed)
Chart Review Routing History Since 06/30/2018 [facility] Full Routing History)  Recipients Sent On Sent By Routed Reports  Georgianne Fick, MD   06/29/2019 11:20 AM Deon Pilling, LPN BASIC METABOLIC PANEL ([id number])      Georgianne Fick, MD   06/29/2019 11:20 AM Deon Pilling, LPN HEMOGLOBIN D0N ([id number])      Georgianne Fick, MD   06/27/2019  9:42 AM Reather Littler, MD BASIC METABOLIC PANEL ([id number])  HEMOGLOBIN A1C ([id number])       Labs faxed as requested. Based on above routing history, it appears Dr. Lucianne Muss faxed the results as well on 06/27/19.

## 2019-07-02 IMAGING — MR MR CARD MORPHOLOGY WO/W CM
9 of 10 series · 15 of 16 positions shown · IV contrast (25    Multihance)
Comparison: none

CLINICAL DATA: 77-year-old female with pericardial effusion of
unknown etiology.

EXAM:
CARDIAC MRI
TECHNIQUE: The patient was scanned on a 1.5 Tesla GE magnet. A dedicated
cardiac coil was used. Functional imaging was done using Fiesta
sequences. [DATE], and 4 chamber views were done to assess for RWMA's.
Modified Tony rule using a short axis stack was used to
calculate an ejection fraction on a dedicated work station using
Circle software. The patient received 27 cc of Multihance. After 10
minutes inversion recovery sequences were used to assess for
infiltration and scar tissue.
CONTRAST:  27 cc  of Multihance

[Series 3: bSSFP · sagittal · 8.0mm · 1.25mm/px · 1 of 17 slices shown (1 of 4)]
[im 1/17]
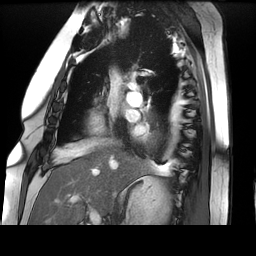

[Series 5: bSSFP · axial · 8.0mm · 1.25mm/px · 1 of 20 slices shown (2 of 4)]
[im 1/20]
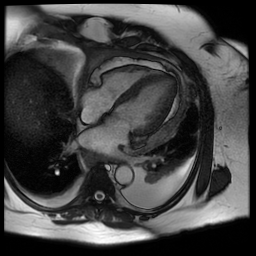

[Series 6: bSSFP · oblique · 8.0mm · 1.37mm/px · 7 of 300 slices shown (3 of 4)]
[im 1/300]
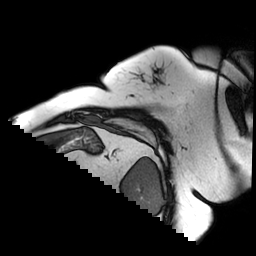
[im 50/300]
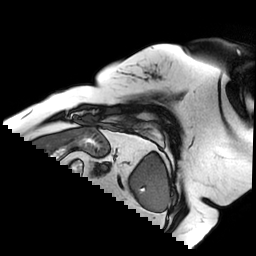
[im 100/300]
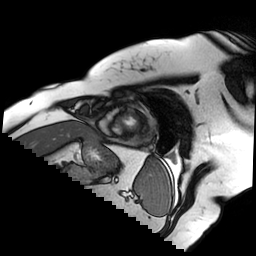
[im 150/300]
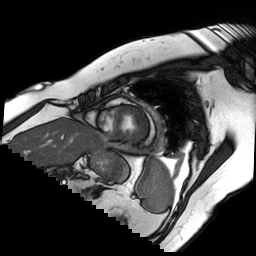
[im 200/300]
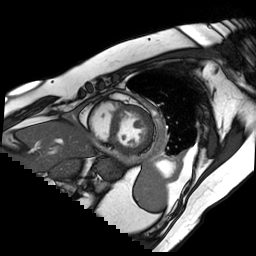
[im 250/300]
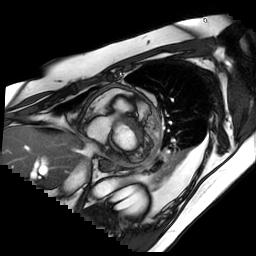
[im 300/300]
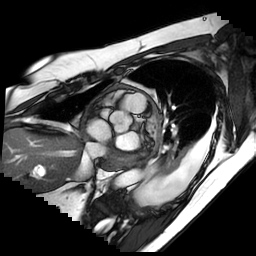

[Series 8: bSSFP · axial · 8.0mm · 1.41mm/px · 1 of 20 slices shown (4 of 4)]
[im 1/20]
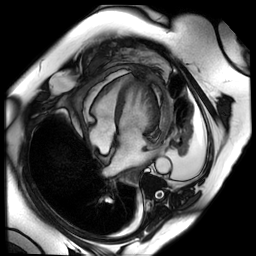

[Series 9: T2 · axial · 8.0mm · 1.41mm/px · 1 of 60 slices shown]
[im 1/60]
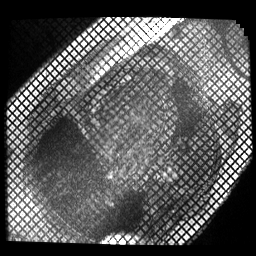

[Series 10: T1 · oblique · 8.0mm · 1.48mm/px · 1 of 12 slices shown]
[im 1/12]
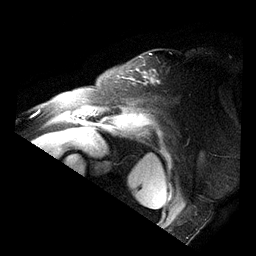

[Series 12: cine ir · oblique · 8.0mm · 1.37mm/px · 1 of 30 slices shown]
[im 1/30]
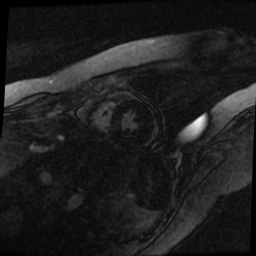

[Series 14: delayed ir prep · oblique · 8.0mm · 1.37mm/px · 1 of 13 slices shown]
[im 1/13]
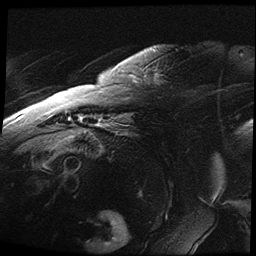

[Series 16: rad delayed ir · axial · 8.0mm · 1.52mm/px · 1 of 3 slices shown]
[im 1/3]
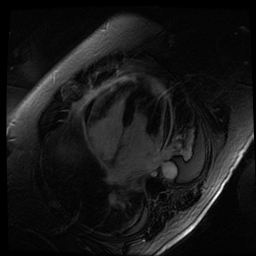

[15 of 16 positions shown; findings below may reference images not displayed]

FINDINGS: 1. Normal left ventricular size, with mild concentric hypertrophy
and normal systolic function (LVEF =66). There are no regional wall
motion abnormalities.

There is no late gadolinium enhancement in the left ventricular
myocardium.

LVEDD: 49 mm

LVESD: 25 mm

LVEDV: 87 ml

LVESV: 29 ml

SV: 58 ml

CO: 4.2 L/min

Myocardial mass: 110 g

2. Normal right ventricular size, thickness and systolic function
(LVEF =68%). There are no regional wall motion abnormalities.

3.  Normal left and right atrial size.

4. Normal size of the aortic root, ascending aorta and pulmonary
artery.

5.  No significant valvular abnormalities.

6. There is a small amount of pericardial effusion located
predominantly lateral to the left ventricle with maximum thickness 9
mm. There is no chamber collapse, IVC is not dilated.

Pericardium is severely thickened measuring up to 6 mm with
significant diffuse late gadolinium enhancement.

7.  There is bilateral pleural effusion more prominent on the left.
IMPRESSION: 1. Normal left ventricular size, with mild concentric hypertrophy
and normal systolic function (LVEF =66). There are no regional wall
motion abnormalities.

There is no late gadolinium enhancement in the left ventricular
myocardium.

2. Normal right ventricular size, thickness and systolic function
(LVEF =68%). There are no regional wall motion abnormalities.

3.  Normal left and right atrial size.

4. Normal size of the aortic root, ascending aorta and pulmonary
artery.

5.  No significant valvular abnormalities.

6. There is a small amount of pericardial effusion located
predominantly lateral to the left ventricle with maximum thickness 9
mm. There is no chamber collapse, IVC is not dilated.

Pericardium is severely thickened measuring up to 6 mm with
significant diffuse late gadolinium enhancement consistent with
severe circumferential acute pericarditis. There is no evidence of
myocarditis.

Significant epicardial fat adjacent to the right ventricle.

7.  There is bilateral pleural effusion more prominent on the left.

## 2019-07-04 ENCOUNTER — Other Ambulatory Visit: Payer: Self-pay

## 2019-07-04 MED ORDER — V-GO 40 KIT: 1.0000 | PACK | Freq: Every day | 2 refills | Status: DC

## 2019-07-13 ENCOUNTER — Other Ambulatory Visit: Payer: Self-pay

## 2019-07-13 ENCOUNTER — Telehealth: Payer: Self-pay | Admitting: Endocrinology

## 2019-07-13 MED ORDER — TRESIBA FLEXTOUCH 100 UNIT/ML ~~LOC~~ SOPN: PEN_INJECTOR | SUBCUTANEOUS | 0 refills | Status: DC

## 2019-07-13 NOTE — Telephone Encounter (Signed)
Pharmacy called stating patient states she is using approximately 25 units daily but we sent the Rx for 17 units - pharmacy states he does not think they can fill it yet and request nurse to give them a call to discuss. Pharmacy ph# [phone]

## 2019-07-13 NOTE — Telephone Encounter (Signed)
Rx amended and resent. 

## 2019-07-13 NOTE — Telephone Encounter (Signed)
Medication Refill Request  Did you call your pharmacy and request this refill first? Yes  . If patient has not contacted pharmacy first, instruct them to do so for future refills.   . Remind them that contacting the pharmacy for their refill is the quickest method to get the refill.   . Refill policy also stated that it will take anywhere between 24-72 hours to receive the refill.    Name of medication? [name] - per pharmacy a new Rx is needed to reflect dosage change at last visit.  Is this a 90 day supply? YES  Name and location [facility]? [facility] on [address]

## 2019-07-13 NOTE — Telephone Encounter (Signed)
Rx sent 

## 2019-07-31 ENCOUNTER — Other Ambulatory Visit: Payer: Self-pay | Admitting: Internal Medicine

## 2019-07-31 DIAGNOSIS — Z1231 Encounter for screening mammogram for malignant neoplasm of breast: Secondary | ICD-10-CM

## 2019-08-06 ENCOUNTER — Other Ambulatory Visit: Payer: Self-pay | Admitting: Endocrinology

## 2019-08-09 ENCOUNTER — Other Ambulatory Visit: Payer: Self-pay

## 2019-08-09 ENCOUNTER — Ambulatory Visit
Admission: RE | Admit: 2019-08-09 | Discharge: 2019-08-09 | Disposition: A | Discharge status: Home / Self Care | Payer: Medicare PPO | Source: Ambulatory Visit | Attending: Internal Medicine | Admitting: Internal Medicine

## 2019-08-09 DIAGNOSIS — Z1231 Encounter for screening mammogram for malignant neoplasm of breast: Secondary | ICD-10-CM

## 2019-08-19 ENCOUNTER — Other Ambulatory Visit: Payer: Self-pay | Admitting: Endocrinology

## 2019-08-29 ENCOUNTER — Other Ambulatory Visit: Payer: Self-pay | Admitting: Endocrinology

## 2019-08-31 ENCOUNTER — Telehealth: Payer: Self-pay | Admitting: Endocrinology

## 2019-08-31 NOTE — Telephone Encounter (Signed)
Patient called re: Patient requests a RX for an alternative for [place] (due to insurance) be sent to: [facility] [address] [place] - [address] [place] Phone:  [phone]  Fax:  [phone]

## 2019-08-31 NOTE — Telephone Encounter (Signed)
Left message for patient to call re: questions regarding prescriptions.

## 2019-08-31 NOTE — Telephone Encounter (Signed)
She has to find out from insurance what is covered

## 2019-09-03 NOTE — Telephone Encounter (Signed)
Left message for patient to call re: prescription Dapagliflozin vs Farxiga.

## 2019-09-03 NOTE — Telephone Encounter (Signed)
Ok to send as requested same dose

## 2019-09-03 NOTE — Telephone Encounter (Signed)
Patient called back stating her insurance covers Dapagliflozin as an alternative to [place].

## 2019-09-03 NOTE — Telephone Encounter (Signed)
Patient called back. The generic option is covered by her insurance.

## 2019-09-03 NOTE — Telephone Encounter (Signed)
Those are the same thing, try sending again

## 2019-09-04 ENCOUNTER — Other Ambulatory Visit: Payer: Self-pay | Admitting: *Deleted

## 2019-09-04 MED ORDER — DAPAGLIFLOZIN PROPANEDIOL 10 MG PO TABS: 10.0000 mg | ORAL_TABLET | Freq: Every day | ORAL | 1 refills | Status: DC

## 2019-09-04 NOTE — Telephone Encounter (Signed)
Dapagliflozin 10mg  sent to pharmacy on file. Patient has been notified.

## 2019-09-04 NOTE — Telephone Encounter (Signed)
Called patient to advise dapagliflozin 10mg  has been sent to the pharmacy on file.

## 2019-09-10 ENCOUNTER — Other Ambulatory Visit: Payer: Self-pay | Admitting: Cardiovascular Disease

## 2019-09-12 ENCOUNTER — Other Ambulatory Visit: Payer: Self-pay | Admitting: Endocrinology

## 2019-10-01 ENCOUNTER — Other Ambulatory Visit: Payer: Medicare PPO

## 2019-10-01 ENCOUNTER — Ambulatory Visit
Admission: RE | Admit: 2019-10-01 | Discharge: 2019-10-01 | Disposition: A | Discharge status: Home / Self Care | Payer: Medicare PPO | Source: Ambulatory Visit | Attending: Gastroenterology | Admitting: Gastroenterology

## 2019-10-01 ENCOUNTER — Other Ambulatory Visit: Payer: Self-pay | Admitting: Gastroenterology

## 2019-10-01 DIAGNOSIS — R112 Nausea with vomiting, unspecified: Secondary | ICD-10-CM

## 2019-10-01 DIAGNOSIS — R194 Change in bowel habit: Secondary | ICD-10-CM

## 2019-10-01 DIAGNOSIS — R140 Abdominal distension (gaseous): Secondary | ICD-10-CM

## 2019-10-02 ENCOUNTER — Other Ambulatory Visit: Payer: Self-pay

## 2019-10-02 ENCOUNTER — Other Ambulatory Visit (INDEPENDENT_AMBULATORY_CARE_PROVIDER_SITE_OTHER): Payer: Medicare PPO

## 2019-10-02 DIAGNOSIS — E063 Autoimmune thyroiditis: Secondary | ICD-10-CM

## 2019-10-02 DIAGNOSIS — Z794 Long term (current) use of insulin: Secondary | ICD-10-CM

## 2019-10-02 DIAGNOSIS — E1165 Type 2 diabetes mellitus with hyperglycemia: Secondary | ICD-10-CM | POA: Diagnosis not present

## 2019-10-02 LAB — COMPREHENSIVE METABOLIC PANEL
ALT: 25 U/L (ref 0–35)
AST: 19 U/L (ref 0–37)
Albumin: 4 g/dL (ref 3.5–5.2)
Alkaline Phosphatase: 96 U/L (ref 39–117)
BUN: 16 mg/dL (ref 6–23)
CO2: 30 mEq/L (ref 19–32)
Calcium: 9.2 mg/dL (ref 8.4–10.5)
Chloride: 100 mEq/L (ref 96–112)
Creatinine, Ser: 0.99 mg/dL (ref 0.40–1.20)
GFR: 54.19 mL/min — ABNORMAL LOW (ref >60.00)
Glucose, Bld: 139 mg/dL — ABNORMAL HIGH (ref 70–99)
Potassium: 4.6 mEq/L (ref 3.5–5.1)
Sodium: 139 mEq/L (ref 135–145)
Total Bilirubin: 0.3 mg/dL (ref 0.2–1.2)
Total Protein: 6.8 g/dL (ref 6.0–8.3)

## 2019-10-02 LAB — HEMOGLOBIN A1C: Hgb A1c MFr Bld: 7.6 % — ABNORMAL HIGH (ref 4.6–6.5)

## 2019-10-02 LAB — MICROALBUMIN / CREATININE URINE RATIO
Creatinine,U: 51.4 mg/dL
Microalb Creat Ratio: 1.4 mg/g (ref 0.0–30.0)
Microalb, Ur: <0.7 mg/dL (ref 0.0–1.9)

## 2019-10-02 LAB — TSH: TSH: 3.12 u[IU]/mL (ref 0.35–4.50)

## 2019-10-02 NOTE — Addendum Note (Signed)
Addended by: Adline Mango I on: 10/02/2019 09:19 AM   Modules accepted: Orders

## 2019-10-04 ENCOUNTER — Other Ambulatory Visit: Payer: Self-pay

## 2019-10-04 ENCOUNTER — Telehealth (INDEPENDENT_AMBULATORY_CARE_PROVIDER_SITE_OTHER): Payer: Medicare PPO | Admitting: Endocrinology

## 2019-10-04 ENCOUNTER — Ambulatory Visit: Payer: Medicare PPO | Admitting: Endocrinology

## 2019-10-04 ENCOUNTER — Encounter: Payer: Self-pay | Admitting: Endocrinology

## 2019-10-04 DIAGNOSIS — E063 Autoimmune thyroiditis: Secondary | ICD-10-CM | POA: Diagnosis not present

## 2019-10-04 DIAGNOSIS — Z794 Long term (current) use of insulin: Secondary | ICD-10-CM | POA: Diagnosis not present

## 2019-10-04 DIAGNOSIS — I10 Essential (primary) hypertension: Secondary | ICD-10-CM | POA: Diagnosis not present

## 2019-10-04 DIAGNOSIS — E1165 Type 2 diabetes mellitus with hyperglycemia: Secondary | ICD-10-CM

## 2019-10-04 NOTE — Progress Notes (Signed)
Patient ID: [name], female   DOB: 30-Jul-1940, 79 y.o.   MRN: [id number]    Today's office visit was provided via telemedicine using audio phone call Explained to the patient and the the limitations of evaluation and management by telemedicine and the availability of in person appointments.  The patient understood the limitations and agreed to proceed. Patient also understood that the telehealth visit is billable. . Location of the patient: Home . Location of the provider: Office Only the patient and myself were participating in the encounter    Reason for Appointment: Followup for Type 2 Diabetes    History of Present Illness:          Diagnosis: Type 2 diabetes mellitus, date of diagnosis:2011       Past history: Since she was apparently intolerant to metformin she was treated with Onglyza until about 2014  At that time because of increasing A1c of about 8% she was started on Levemir 15 units and this was aggressively increased Onglyza was continued She has not tried any other treatments for diabetes Her A1c has been 10-10.5 in 2015 In 3/15 she was told to start small doses of NovoLog at lunch and supper and add 15 units of Levemir at night also To control  hyperglycemia with her basal bolus insulin regimen she was given Victoza in addition on her initial consultation in 5/15 She has been on Victoza since 8/15  She has been on the V-go pump since 09/2014  Recent history:   INSULIN regimen :  V-go pump 40 unit basal, boluses at meal times: 10-10-/14 units TRESIBA 25 units daily   Noninsulin hypoglycemic drugs the patient is taking are: Farxiga 10 mg, daily VICTOZA 1.8 mg  Her A1c is 7.6 and about the same as before   Current management, blood sugar patterns and problems:  She has gone up on the [place] further since her last visit when her fasting blood sugars were averaging nearly 180  This is despite continuing the 40 unit pump  However except for  the last few days her overall blood sugar control is still not been adequate including high fasting readings.  Blood sugar monitoring has been intermittent in the last couple of weeks  Also has minimal blood sugar monitoring after dinner  She is quite regular with taking her boluses before eating up to 14 units before dinner  May take more when she is eating larger meals or more carbohydrates  She does think that she is losing a pound a week and now down to 222 pounds compared to 235 in January  No side effects with [place]  Also no hypoglycemia with lowest blood sugar recently about 95  She is trying to walk 15 to 20 minutes daily as before   Meals: 3 meals per day.  Breakfast: Oatmeal or egg or cereal at 8-9 am sometimes with sausage for protein, dinner 5-6 pm.;   Night snacks will be fruit or peanut butter crackers    Side effects from medications have been: Metformin: rash  Glucose monitoring:  done 1 time a day or less, mostly in the morning       Glucometer:  One Touch.       Blood Glucose readings from home glucose meter download   PRE-MEAL Fasting Lunch Dinner Bedtime Overall  Glucose range:  146-197  125  159   95-276  Mean/30 days      178   POST-MEAL PC Breakfast PC Lunch PC  Dinner  Glucose range:   276, 143   Mean/median:      Previous data:  PRE-MEAL  morning Lunch Dinner Bedtime Overall  Glucose range:  157-205  103-171     Mean/median:  178  154    162   POST-MEAL PC Breakfast PC Lunch PC Dinner  Glucose range:    114-181  Mean/median:    151    Glycemic control:    Lab Results  Component Value Date   HGBA1C 7.6 (H) 10/02/2019   HGBA1C 7.7 (H) 06/25/2019   HGBA1C 7.7 (H) 03/14/2019   Lab Results  Component Value Date   MICROALBUR <0.7 10/02/2019   LDLCALC 73 03/26/2017   CREATININE 0.99 10/02/2019    Self-care: The diet that the patient has been following is: tries to limit fats            Dietician visit: Most recent: 2/19   CDE  visit: 9/16    Weight history:  Wt Readings from Last 3 Encounters:  01/31/19 235 lb (106.6 kg)  12/23/17 221 lb 3.2 oz (100.3 kg)  10/19/17 214 lb (97.1 kg)    Other active problems: See review of systems    Allergies as of 10/04/2019      Reactions   Flagyl [metronidazole Hcl] Itching, Rash   Ciprofloxacin Itching, Rash   Metformin And Related Rash   Milk-related Compounds Other (See Comments)   Stomach pains   Other Other (See Comments)   [place] nuts cause severe facial redness      Medication List       Accurate as of October 04, 2019 12:59 PM. If you have any questions, ask your nurse or doctor.        aspirin 81 MG EC tablet Take 1 tablet (81 mg total) by mouth daily.   betamethasone dipropionate 0.05 % cream Apply 1 application topically 2 (two) times daily as needed (rash).   carbamazepine 200 MG 12 hr capsule Commonly known as: CARBATROL Take 200 mg by mouth 2 (two) times daily.   clobetasol 0.05 % Gel Commonly known as: TEMOVATE Apply 1 application topically 2 (two) times daily as needed (rash).   diltiazem 360 MG 24 hr capsule Commonly known as: CARDIZEM CD Take 1 capsule (360 mg total) by mouth at bedtime.   donepezil 10 MG tablet Commonly known as: ARICEPT Take 1 tablet by mouth at bedtime.   esomeprazole 40 MG capsule Commonly known as: NexIUM Take 1 capsule (40 mg total) by mouth daily at 12 noon.   Farxiga 10 MG Tabs tablet Generic drug: dapagliflozin propanediol TAKE ONE TABLET BY MOUTH DAILY *REPLACES INVOKANA   dapagliflozin propanediol 10 MG Tabs tablet Commonly known as: FARXIGA Take 1 tablet (10 mg total) by mouth daily. Generic only   folic acid 1 MG tablet Commonly known as: FOLVITE Take 1 mg by mouth daily.   furosemide 40 MG tablet Commonly known as: LASIX Take 1 tablet (40 mg total) by mouth daily.   gabapentin 300 MG capsule Commonly known as: NEURONTIN Take 300 mg by mouth 2 (two) times daily.   insulin  aspart 100 UNIT/ML injection Commonly known as: NovoLOG Inject 80 Units into the skin See admin instructions. USE TO FILL V-GO WITH 80 UNITS DAILY   Insulin Pen Needle 33G X 5 MM Misc Commonly known as: Comfort EZ Pen Needles 1 each by Does not apply route daily. Use with Tresiba pen   Insulin Pen Needle 32G X 5 MM Misc 1  each by Does not apply route daily. Use with Tyler Aas pen   BD Pen Needle Nano U/F 32G X 4 MM Misc Generic drug: Insulin Pen Needle USE 8 PEN NEEDLES PER DAY   isosorbide mononitrate 30 MG 24 hr tablet Commonly known as: IMDUR TAKE ONE TABLET BY MOUTH DAILY (NEED OFFICE VISIT FOR REFILLS)   levothyroxine 75 MCG tablet Commonly known as: SYNTHROID Take 75 mcg by mouth daily before breakfast.   methotrexate 2.5 MG tablet Commonly known as: RHEUMATREX Take 7.5 mg by mouth every Monday. For rash   metoprolol tartrate 25 MG tablet Commonly known as: LOPRESSOR TAKE ONE TABLET BY MOUTH TWICE A DAY   OLANZapine 7.5 MG tablet Commonly known as: ZYPREXA Take 35 mg by mouth at bedtime.   OLANZapine 20 MG tablet Commonly known as: ZYPREXA Take 20 mg by mouth at bedtime.   ondansetron 8 MG tablet Commonly known as: Zofran Take 1 tablet (8 mg total) by mouth every 8 (eight) hours as needed for nausea.   OneTouch Delica Lancets 53Z Misc USE LANCET TO TEST FIVE TIMES A DAY   OneTouch Verio test strip Generic drug: glucose blood USE ONE STRIP TO TEST THREE TIMES A DAY   pravastatin 40 MG tablet Commonly known as: PRAVACHOL Take 1 tablet (40 mg total) by mouth daily.   QUEtiapine 25 MG tablet Commonly known as: SEROQUEL Take 4 tablets by mouth at bedtime.   Tyler Aas FlexTouch 100 UNIT/ML FlexTouch Pen Generic drug: insulin degludec INJECT 25 UNITS UNDER THE SKIN ONCE DAILY   V-Go 40 Kit 1 each by Other route daily. as directed   Victoza 18 MG/3ML Sopn Generic drug: liraglutide INJECT 1.8MG UNDER THE SKIN ONCE DAILY   Vimpat 50 MG Tabs tablet Generic  drug: lacosamide Take 50 mg by mouth 2 (two) times daily.       Allergies:  Allergies  Allergen Reactions  . Flagyl [Metronidazole Hcl] Itching and Rash  . Ciprofloxacin Itching and Rash  . Metformin And Related Rash  . Milk-Related Compounds Other (See Comments)    Stomach pains  . Other Other (See Comments)    [place] nuts cause severe facial redness    Past Medical History:  Diagnosis Date  . Bipolar disorder ([place])   . Depression    Bipolar  . GERD (gastroesophageal reflux disease)   . History of hiatal hernia   . History of kidney stones   . History of stomach ulcers 1970s   "bleeding"  . Hyperlipidemia   . Hypertension   . Hypothyroidism   . Ischemic colitis, enteritis, or enterocolitis ([place])   . OSA on CPAP   . Pericarditis 05/06/2017  . Pneumonia    "several times" (06/23/2017)  . Thyroid disease   . TIA (transient ischemic attack) 2011 X 2  . Type II diabetes mellitus ([place])   . Urinary bladder incontinence   . Vertigo     Past Surgical History:  Procedure Laterality Date  . APPENDECTOMY    . BREAST CYST ASPIRATION Bilateral   . CARDIAC CATHETERIZATION N/A 12/24/2014   Procedure: Right/Left Heart Cath and Coronary Angiography;  Surgeon: Sanda Klein, MD;  Location: [facility];  Service: Cardiovascular;  Laterality: N/A;  . CATARACT EXTRACTION W/ INTRAOCULAR LENS  IMPLANT, BILATERAL Bilateral   . EXCISIONAL HEMORRHOIDECTOMY    . EYE SURGERY    . FRACTURE SURGERY    . IR THORACENTESIS ASP PLEURAL SPACE W/IMG GUIDE  05/13/2017  . LAPAROSCOPIC CHOLECYSTECTOMY    .  LITHOTRIPSY  "several times"  . RETINAL DETACHMENT SURGERY     "think it was on the left; not sure" (06/23/2017)  . TONSILLECTOMY    . VAGINAL HYSTERECTOMY     partial     Family History  Problem Relation Age of Onset  . Heart disease Mother   . Stroke Father   . Parkinson's disease Father   . Cancer Sister   . Cancer Brother     Social History:  reports that she has never smoked.  She has never used smokeless tobacco. She reports that she does not drink alcohol and does not use drugs.    Review of Systems    NEUROPATHY:  Taking gabapentin      Lipids: Last LDL from PCP was 82, followed by PCP       Lab Results  Component Value Date   CHOL 149 03/26/2017   HDL 50 03/26/2017   LDLCALC 73 03/26/2017   TRIG 130 03/26/2017   CHOLHDL 3.0 03/26/2017       Thyroid:   Has had presumed hypothyroidism for over 20 years, has not had a change in her 75 mcg levothyroxine dose in several years Last TSH was normal, followed by PCP also   Lab Results  Component Value Date   TSH 3.12 10/02/2019   TSH 3.59 08/16/2018   TSH 0.71 10/17/2017   FREET4 1.04 05/05/2017   FREET4 0.86 01/31/2014   FREET4 0.91 08/28/2013       The blood pressure has been managed by PCP with a combination of amlodipine and , hydralazine and bisoprolol Most recent blood pressure 140/75     BP Readings from Last 3 Encounters:  12/23/17 110/76  10/19/17 126/72  09/09/17 121/62     Physical Examination:  There were no vitals taken for this visit.     ASSESSMENT/PLAN:   Diabetes type 2, with obesity  See history of present illness for detailed discussion of his current management, blood sugar patterns and problems identified  A1c is still relatively high at 7.6  She is on a V-go pump along with [place] and Victoza  Although overall her blood sugars averaging 178 for the last 30 days recently most of her blood sugars are below 150 although very few readings after her evening meal which is her largest meal Fasting readings are coming down and benefiting from progressively increasing Tresiba, now 25 minutes She is losing weight and is likely benefiting from the milligram Farxiga No change in renal function with this  Recommendations: More blood sugar monitoring after dinner Discussed trying to keep evening blood sugars under 180 She will go up another 2 units and use 27 units  Tresiba to keep morning sugars at least under 140 consistently Adjust her mealtime clicks based on how much she is eating  HYPONATREMIA: Resolved and not clear if previous low reading was a lab error  Hypothyroidism: Adequately replaced  Follow-up in 3 months   There are no Patient Instructions on file for this visit.  Duration of telephone encounter = 12 minutes  Elayne Snare 10/04/2019, 12:59 PM   Note: This office note was prepared with Dragon voice recognition system technology. Any transcriptional errors that result from this process are unintentional.

## 2019-10-09 ENCOUNTER — Other Ambulatory Visit: Payer: Self-pay | Admitting: Endocrinology

## 2019-10-19 ENCOUNTER — Telehealth: Payer: Self-pay | Admitting: *Deleted

## 2019-10-19 NOTE — Telephone Encounter (Signed)
One touch verio strip is not covered by insurance

## 2019-10-24 ENCOUNTER — Other Ambulatory Visit: Payer: Self-pay | Admitting: Endocrinology

## 2019-10-26 ENCOUNTER — Telehealth: Payer: Self-pay | Admitting: Endocrinology

## 2019-10-26 NOTE — Telephone Encounter (Signed)
Per patient her insurance is no longer covering her One Touch Verio meter of supplies.  Insurance will cover Accu-Chek.  Patient needs a new meter kits and accompanying lancets, test strips etc.  Patients pharmacy is the [facility] on [address]

## 2019-10-27 MED ORDER — ACCU-CHEK GUIDE VI STRP: ORAL_STRIP | 12 refills | Status: DC

## 2019-10-27 MED ORDER — ACCU-CHEK SOFTCLIX LANCETS MISC: 3 refills | Status: DC

## 2019-10-27 MED ORDER — ACCU-CHEK GUIDE W/DEVICE KIT: PACK | 1 refills | Status: DC

## 2019-10-27 NOTE — Telephone Encounter (Signed)
New prescription for Accu-chek supplies sent.

## 2019-11-12 ENCOUNTER — Other Ambulatory Visit: Payer: Self-pay | Admitting: Endocrinology

## 2019-11-23 ENCOUNTER — Other Ambulatory Visit: Payer: Self-pay | Admitting: Cardiovascular Disease

## 2019-11-27 ENCOUNTER — Other Ambulatory Visit: Payer: Self-pay | Admitting: Endocrinology

## 2019-12-24 LAB — LIPID PANEL
HDL: 56 (ref 35–70)
LDL Cholesterol: 82

## 2019-12-24 LAB — BASIC METABOLIC PANEL: Creatinine: 1 (ref 0.5–1.1)

## 2019-12-24 LAB — HEMOGLOBIN A1C: Hemoglobin A1C: 6.8

## 2019-12-26 ENCOUNTER — Other Ambulatory Visit: Payer: Self-pay

## 2019-12-26 ENCOUNTER — Encounter (INDEPENDENT_AMBULATORY_CARE_PROVIDER_SITE_OTHER): Payer: Medicare PPO | Admitting: Ophthalmology

## 2019-12-26 DIAGNOSIS — H35033 Hypertensive retinopathy, bilateral: Secondary | ICD-10-CM

## 2019-12-26 DIAGNOSIS — I10 Essential (primary) hypertension: Secondary | ICD-10-CM | POA: Diagnosis not present

## 2019-12-26 DIAGNOSIS — E11319 Type 2 diabetes mellitus with unspecified diabetic retinopathy without macular edema: Secondary | ICD-10-CM | POA: Diagnosis not present

## 2019-12-26 DIAGNOSIS — E113293 Type 2 diabetes mellitus with mild nonproliferative diabetic retinopathy without macular edema, bilateral: Secondary | ICD-10-CM | POA: Diagnosis not present

## 2019-12-26 DIAGNOSIS — H43813 Vitreous degeneration, bilateral: Secondary | ICD-10-CM

## 2020-01-03 ENCOUNTER — Other Ambulatory Visit: Payer: Self-pay

## 2020-01-03 ENCOUNTER — Ambulatory Visit (INDEPENDENT_AMBULATORY_CARE_PROVIDER_SITE_OTHER): Payer: Medicare PPO | Admitting: Endocrinology

## 2020-01-03 ENCOUNTER — Encounter: Payer: Self-pay | Admitting: Endocrinology

## 2020-01-03 VITALS — BP 160/98 | HR 71 | Ht 63.0 in | Wt 231.6 lb

## 2020-01-03 DIAGNOSIS — I10 Essential (primary) hypertension: Secondary | ICD-10-CM

## 2020-01-03 DIAGNOSIS — E063 Autoimmune thyroiditis: Secondary | ICD-10-CM | POA: Diagnosis not present

## 2020-01-03 DIAGNOSIS — Z794 Long term (current) use of insulin: Secondary | ICD-10-CM | POA: Diagnosis not present

## 2020-01-03 DIAGNOSIS — E1165 Type 2 diabetes mellitus with hyperglycemia: Secondary | ICD-10-CM | POA: Diagnosis not present

## 2020-01-03 NOTE — Progress Notes (Signed)
Patient ID: [name], female   DOB: November 25, 1940, 79 y.o.   MRN: [id number]    Reason for Appointment: Followup for Type 2 Diabetes    History of Present Illness:          Diagnosis: Type 2 diabetes mellitus, date of diagnosis:2011       Past history: Since she was apparently intolerant to metformin she was treated with Onglyza until about 2014  At that time because of increasing A1c of about 8% she was started on Levemir 15 units and this was aggressively increased Onglyza was continued She has not tried any other treatments for diabetes Her A1c has been 10-10.5 in 2015 In 3/15 she was told to start small doses of NovoLog at lunch and supper and add 15 units of Levemir at night also To control  hyperglycemia with her basal bolus insulin regimen she was given Victoza in addition on her initial consultation in 5/15 She has been on Victoza since 8/15  She has been on the V-go pump since 09/2014  Recent history:   INSULIN regimen :  V-go pump 40 unit basal, boluses at meal times: 10-10-/14 units TRESIBA 27 units daily   Noninsulin hypoglycemic drugs the patient is taking are: Farxiga 10 mg, daily VICTOZA 1.8 mg  Her A1c is 6.8 done by PCP recently, previously was 7.6   Current management, blood sugar patterns and problems:  Difficult to assess her blood sugars since she did not bring her monitor for download  She still thinks she has some higher readings in the mornings at times even with increasing Tresiba by 2 units on the last visit  However she is likely not having as many high readings after meals although uncertain how often she monitors after meals  Her weight is down this year although not clear if she recently she has lost any weight  She is trying to monitor her portions and carbohydrates  Also recently appears to be getting enough insulin before meals with bolus clicks and not using any injectable NovoLog for meal coverage  She changes her pump at  the same time daily  She is trying to walk 15 to 20 minutes daily as before   Meals: 3 meals per day.  Breakfast: Oatmeal or egg or cereal at 8-9 am sometimes with sausage for protein, dinner 5-6 pm.;   Night snacks will be fruit or peanut butter crackers    Side effects from medications have been: Metformin: rash  Glucose monitoring:  done 1 time a day or less, mostly in the morning       Glucometer: ?  Accu-Chek    Blood Glucose readings from recall:   PRE-MEAL Fasting Lunch Dinner Bedtime Overall  Glucose range: 90-150      Mean/median:     ?   POST-MEAL PC Breakfast PC Lunch PC Dinner  Glucose range: 154  166, 187  Mean/median:      Previous data:  PRE-MEAL Fasting Lunch Dinner Bedtime Overall  Glucose range:  146-197  125  159   95-276  Mean/30 days      178   POST-MEAL PC Breakfast PC Lunch PC Dinner  Glucose range:   276, 143   Mean/median:       Glycemic control:    Lab Results  Component Value Date   HGBA1C 7.6 (H) 10/02/2019   HGBA1C 7.7 (H) 06/25/2019   HGBA1C 7.7 (H) 03/14/2019   Lab Results  Component Value Date  MICROALBUR <0.7 10/02/2019   LDLCALC 73 03/26/2017   CREATININE 0.99 10/02/2019    Self-care: The diet that the patient has been following is: tries to limit fats            Dietician visit: Most recent: 2/19   CDE visit: 9/16    Weight history:  Wt Readings from Last 3 Encounters:  01/03/20 231 lb 9.6 oz (105.1 kg)  01/31/19 235 lb (106.6 kg)  12/23/17 221 lb 3.2 oz (100.3 kg)    Other active problems: See review of systems    Allergies as of 01/03/2020      Reactions   Flagyl [metronidazole Hcl] Itching, Rash   Ciprofloxacin Itching, Rash   Metformin And Related Rash   Milk-related Compounds Other (See Comments)   Stomach pains   Other Other (See Comments)   [place] nuts cause severe facial redness      Medication List       Accurate as of January 03, 2020  4:59 PM. If you have any questions, ask your nurse or  doctor.        Accu-Chek Guide test strip Generic drug: glucose blood Use 1-4 times daily as needed/instructed DX E11.9   Accu-Chek Guide w/Device Kit Use 1-4 times daily as needed/directed  DX E11.9   aspirin 81 MG EC tablet Take 1 tablet (81 mg total) by mouth daily.   betamethasone dipropionate 0.05 % cream Apply 1 application topically 2 (two) times daily as needed (rash).   carbamazepine 200 MG 12 hr capsule Commonly known as: CARBATROL Take 200 mg by mouth 2 (two) times daily.   clobetasol 0.05 % Gel Commonly known as: TEMOVATE Apply 1 application topically 2 (two) times daily as needed (rash).   diltiazem 360 MG 24 hr capsule Commonly known as: CARDIZEM CD Take 1 capsule (360 mg total) by mouth at bedtime.   donepezil 10 MG tablet Commonly known as: ARICEPT Take 1 tablet by mouth at bedtime.   esomeprazole 40 MG capsule Commonly known as: NexIUM Take 1 capsule (40 mg total) by mouth daily at 12 noon.   Farxiga 10 MG Tabs tablet Generic drug: dapagliflozin propanediol TAKE ONE TABLET BY MOUTH DAILY *REPLACES INVOKANA   dapagliflozin propanediol 10 MG Tabs tablet Commonly known as: FARXIGA Take 1 tablet (10 mg total) by mouth daily. Generic only   folic acid 1 MG tablet Commonly known as: FOLVITE Take 1 mg by mouth daily.   furosemide 40 MG tablet Commonly known as: LASIX Take 1 tablet (40 mg total) by mouth daily.   gabapentin 300 MG capsule Commonly known as: NEURONTIN Take 300 mg by mouth 2 (two) times daily.   insulin aspart 100 UNIT/ML injection Commonly known as: novoLOG INJECT 80 UNITS INTO THE SKIN. SEE ADMIN INSTRUCIOTNS. USE TO FILL V-GO WITH 80 UNITS DAILY   Insulin Pen Needle 33G X 5 MM Misc Commonly known as: Comfort EZ Pen Needles 1 each by Does not apply route daily. Use with Tresiba pen   Insulin Pen Needle 32G X 5 MM Misc 1 each by Does not apply route daily. Use with Tyler Aas pen   BD Pen Needle Nano U/F 32G X 4 MM  Misc Generic drug: Insulin Pen Needle USE 8 PEN NEEDLES PER DAY   isosorbide mononitrate 30 MG 24 hr tablet Commonly known as: IMDUR TAKE ONE TABLET BY MOUTH DAILY (NEED OFFICE VISIT FOR REFILLS)   levothyroxine 75 MCG tablet Commonly known as: SYNTHROID Take 75 mcg by mouth daily  before breakfast.   methotrexate 2.5 MG tablet Commonly known as: RHEUMATREX Take 7.5 mg by mouth every Monday. For rash   metoprolol tartrate 25 MG tablet Commonly known as: LOPRESSOR TAKE ONE TABLET BY MOUTH TWICE A DAY   OLANZapine 7.5 MG tablet Commonly known as: ZYPREXA Take 35 mg by mouth at bedtime.   OLANZapine 20 MG tablet Commonly known as: ZYPREXA Take 20 mg by mouth at bedtime.   OLANZapine zydis 10 MG disintegrating tablet Commonly known as: ZYPREXA Take 10 mg by mouth at bedtime.   ondansetron 8 MG tablet Commonly known as: Zofran Take 1 tablet (8 mg total) by mouth every 8 (eight) hours as needed for nausea.   OneTouch Delica Lancets 49Z Misc USE LANCET TO TEST FIVE TIMES A DAY   Accu-Chek Softclix Lancets lancets Use 1-4 times daily as needed/instructed  DX E11.9   pravastatin 40 MG tablet Commonly known as: PRAVACHOL Take 1 tablet (40 mg total) by mouth daily.   QUEtiapine 25 MG tablet Commonly known as: SEROQUEL Take 4 tablets by mouth at bedtime.   Tyler Aas FlexTouch 100 UNIT/ML FlexTouch Pen Generic drug: insulin degludec INJECT 25 UNITS UNDER THE SKIN ONCE DAILY   V-Go 40 Kit USE 1 KIT DAILY AS DIRECTED   Victoza 18 MG/3ML Sopn Generic drug: liraglutide DIAL AND INJECT UNDER THE SKIN 1.8 MG DAILY   Vimpat 50 MG Tabs tablet Generic drug: lacosamide Take 50 mg by mouth 2 (two) times daily.       Allergies:  Allergies  Allergen Reactions  . Flagyl [Metronidazole Hcl] Itching and Rash  . Ciprofloxacin Itching and Rash  . Metformin And Related Rash  . Milk-Related Compounds Other (See Comments)    Stomach pains  . Other Other (See Comments)     [place] nuts cause severe facial redness    Past Medical History:  Diagnosis Date  . Bipolar disorder ([place])   . Depression    Bipolar  . GERD (gastroesophageal reflux disease)   . History of hiatal hernia   . History of kidney stones   . History of stomach ulcers 1970s   "bleeding"  . Hyperlipidemia   . Hypertension   . Hypothyroidism   . Ischemic colitis, enteritis, or enterocolitis ([place])   . OSA on CPAP   . Pericarditis 05/06/2017  . Pneumonia    "several times" (06/23/2017)  . Thyroid disease   . TIA (transient ischemic attack) 2011 X 2  . Type II diabetes mellitus ([place])   . Urinary bladder incontinence   . Vertigo     Past Surgical History:  Procedure Laterality Date  . APPENDECTOMY    . BREAST CYST ASPIRATION Bilateral   . CARDIAC CATHETERIZATION N/A 12/24/2014   Procedure: Right/Left Heart Cath and Coronary Angiography;  Surgeon: Sanda Klein, MD;  Location: [facility];  Service: Cardiovascular;  Laterality: N/A;  . CATARACT EXTRACTION W/ INTRAOCULAR LENS  IMPLANT, BILATERAL Bilateral   . EXCISIONAL HEMORRHOIDECTOMY    . EYE SURGERY    . FRACTURE SURGERY    . IR THORACENTESIS ASP PLEURAL SPACE W/IMG GUIDE  05/13/2017  . LAPAROSCOPIC CHOLECYSTECTOMY    . LITHOTRIPSY  "several times"  . RETINAL DETACHMENT SURGERY     "think it was on the left; not sure" (06/23/2017)  . TONSILLECTOMY    . VAGINAL HYSTERECTOMY     partial     Family History  Problem Relation Age of Onset  . Heart disease Mother   . Stroke Father   .  Parkinson's disease Father   . Cancer Sister   . Cancer Brother     Social History:  reports that she has never smoked. She has never used smokeless tobacco. She reports that she does not drink alcohol and does not use drugs.    Review of Systems    NEUROPATHY:  Taking gabapentin      Lipids: Last LDL from PCP was 82, followed by PCP       Lab Results  Component Value Date   CHOL 149 03/26/2017   HDL 50 03/26/2017   LDLCALC 73  03/26/2017   TRIG 130 03/26/2017   CHOLHDL 3.0 03/26/2017       Thyroid:   Has had presumed hypothyroidism for over 20 years, has not had a change in her 75 mcg levothyroxine dose in several years Last TSH was 2.01, followed by PCP also   Lab Results  Component Value Date   TSH 3.12 10/02/2019   TSH 3.59 08/16/2018   TSH 0.71 10/17/2017   FREET4 1.04 05/05/2017   FREET4 0.86 01/31/2014   FREET4 0.91 08/28/2013       The blood pressure has been managed by PCP with a combination of diltiazem  and bisoprolol Most recent blood pressure 128/80, she says her blood pressure is higher because of not taking her medications and having a procedure today     BP Readings from Last 3 Encounters:  01/03/20 (!) 160/98  12/23/17 110/76  10/19/17 126/72     Physical Examination:  BP (!) 160/98   Pulse 71   Ht 5' 3"  (1.6 m)   Wt 231 lb 9.6 oz (105.1 kg)   SpO2 95%   BMI 41.03 kg/m      ASSESSMENT/PLAN:   Diabetes type 2, with obesity  See history of present illness for detailed discussion of his current management, blood sugar patterns and problems identified  A1c is 6.8 done from outside lab, previously 7.6  She is on a V-go pump using NovoLog along with [place] basal insulin supplement and Farxiga and Victoza  A1c is improved as per PCP but blood sugars were not reviewed today Since she does not have any consistently high fasting readings reportedly she can continue same dose of [place] for now Not clear if she is checking readings after meals consistently As before difficult for her to lose weight  Recommendations: She will call back to let us know what her blood sugar readings are If morning sugars are consistently high will need to increase [place] again She will try to continue doing regular walking for exercise Also keep portions and carbohydrates limited No change in bolus amounts for meals or snacks Reminded her to check readings consistently after meals  also  HYPONATREMIA: Sodium borderline at 135 recently, not clear why it is low  Hypothyroidism: Recent TSH normal with PCP on 75 mcg levothyroxine  Hypertension: Blood pressure is higher today but this is related to stress and not taking her medication, better controlled with PCP  Follow-up in 3 months   There are no Patient Instructions on file for this visit.    Elayne Snare 01/03/2020, 4:59 PM   Note: This office note was prepared with Dragon voice recognition system technology. Any transcriptional errors that result from this process are unintentional.

## 2020-01-04 ENCOUNTER — Other Ambulatory Visit: Payer: Self-pay | Admitting: *Deleted

## 2020-01-04 MED ORDER — INSULIN ASPART 100 UNIT/ML ~~LOC~~ SOLN
SUBCUTANEOUS | 3 refills | Status: DC
Start: 2020-01-04 — End: 2020-03-10

## 2020-01-07 ENCOUNTER — Other Ambulatory Visit: Payer: Self-pay | Admitting: *Deleted

## 2020-01-07 MED ORDER — VICTOZA 18 MG/3ML ~~LOC~~ SOPN
PEN_INJECTOR | SUBCUTANEOUS | 3 refills | Status: DC
Start: 2020-01-07 — End: 2020-05-08

## 2020-01-10 ENCOUNTER — Other Ambulatory Visit: Payer: Self-pay | Admitting: *Deleted

## 2020-01-10 MED ORDER — V-GO 40 KIT
PACK | 5 refills | Status: DC
Start: 2020-01-10 — End: 2020-05-28

## 2020-01-14 ENCOUNTER — Encounter: Payer: Self-pay | Admitting: *Deleted

## 2020-03-08 ENCOUNTER — Other Ambulatory Visit: Payer: Self-pay | Admitting: Cardiovascular Disease

## 2020-03-10 ENCOUNTER — Other Ambulatory Visit: Payer: Self-pay | Admitting: *Deleted

## 2020-03-10 MED ORDER — INSULIN ASPART 100 UNIT/ML ~~LOC~~ SOLN: SUBCUTANEOUS | 3 refills | Status: DC

## 2020-03-12 ENCOUNTER — Other Ambulatory Visit: Payer: Self-pay | Admitting: Cardiovascular Disease

## 2020-03-28 ENCOUNTER — Other Ambulatory Visit (INDEPENDENT_AMBULATORY_CARE_PROVIDER_SITE_OTHER): Payer: Medicare PPO

## 2020-03-28 ENCOUNTER — Other Ambulatory Visit: Payer: Self-pay

## 2020-03-28 ENCOUNTER — Other Ambulatory Visit: Payer: Self-pay | Admitting: Cardiovascular Disease

## 2020-03-28 DIAGNOSIS — E1165 Type 2 diabetes mellitus with hyperglycemia: Secondary | ICD-10-CM

## 2020-03-28 DIAGNOSIS — Z794 Long term (current) use of insulin: Secondary | ICD-10-CM | POA: Diagnosis not present

## 2020-03-28 LAB — BASIC METABOLIC PANEL
BUN: 13 mg/dL (ref 6–23)
CO2: 26 mEq/L (ref 19–32)
Calcium: 9.2 mg/dL (ref 8.4–10.5)
Chloride: 100 mEq/L (ref 96–112)
Creatinine, Ser: 0.86 mg/dL (ref 0.40–1.20)
GFR: 64.49 mL/min (ref >60.00)
Glucose, Bld: 161 mg/dL — ABNORMAL HIGH (ref 70–99)
Potassium: 4.6 mEq/L (ref 3.5–5.1)
Sodium: 134 mEq/L — ABNORMAL LOW (ref 135–145)

## 2020-03-28 LAB — HEMOGLOBIN A1C: Hgb A1c MFr Bld: 7.5 % — ABNORMAL HIGH (ref 4.6–6.5)

## 2020-04-03 ENCOUNTER — Other Ambulatory Visit: Payer: Self-pay

## 2020-04-03 ENCOUNTER — Ambulatory Visit (INDEPENDENT_AMBULATORY_CARE_PROVIDER_SITE_OTHER): Payer: Medicare PPO | Admitting: Endocrinology

## 2020-04-03 ENCOUNTER — Other Ambulatory Visit: Payer: Self-pay | Admitting: Cardiovascular Disease

## 2020-04-03 ENCOUNTER — Encounter: Payer: Self-pay | Admitting: Endocrinology

## 2020-04-03 VITALS — BP 140/82 | HR 73 | Resp 20 | Ht 63.0 in | Wt 237.2 lb

## 2020-04-03 DIAGNOSIS — E871 Hypo-osmolality and hyponatremia: Secondary | ICD-10-CM | POA: Diagnosis not present

## 2020-04-03 DIAGNOSIS — E063 Autoimmune thyroiditis: Secondary | ICD-10-CM

## 2020-04-03 DIAGNOSIS — Z794 Long term (current) use of insulin: Secondary | ICD-10-CM

## 2020-04-03 DIAGNOSIS — E1165 Type 2 diabetes mellitus with hyperglycemia: Secondary | ICD-10-CM

## 2020-04-03 MED ORDER — NOVOLOG FLEXPEN 100 UNIT/ML ~~LOC~~ SOPN: PEN_INJECTOR | SUBCUTANEOUS | 1 refills | Status: DC

## 2020-04-03 NOTE — Patient Instructions (Signed)
TRESIBA insulin: Increased to 32 units On a weekly basis adjust the dose up or down 2 units after morning sugars are staying over 130 or going below 90  NOVOLOG/aspart insulin: Start using the insulin pen to give 14 to 16 units to cover evening meal based on how much carbohydrates you are having Blood sugar should be under 180 after the meal  For breakfast do 6 clicks, lunchtime dose 6-7 clicks and 2-3 clicks for any carbohydrate snacks  Please call your cardiologist regarding shortness of breath and leg swelling and move up your appointment

## 2020-04-03 NOTE — Progress Notes (Signed)
Patient ID: [name], female   DOB: [birth year], 80 y.o.   MRN: [id number]    Reason for Appointment: Followup for Type 2 Diabetes    History of Present Illness:          Diagnosis: Type 2 diabetes mellitus, date of diagnosis:2011       Past history: Since she was apparently intolerant to metformin she was treated with Onglyza until about 2014  At that time because of increasing A1c of about 8% she was started on Levemir 15 units and this was aggressively increased Onglyza was continued She has not tried any other treatments for diabetes Her A1c has been 10-10.5 in 2015 In 3/15 she was told to start small doses of NovoLog at lunch and supper and add 15 units of Levemir at night also To control  hyperglycemia with her basal bolus insulin regimen she was given Victoza in addition on her initial consultation in 5/15 She has been on Victoza since 8/15  She has been on the V-go pump since 09/2014  Recent history:   INSULIN regimen :  V-go pump 40 unit basal, boluses at meal times: 10-28-10 units TRESIBA 27 units daily   Noninsulin hypoglycemic drugs the patient is taking are: Farxiga 10 mg, daily VICTOZA 1.8 mg  Her A1c is 7.5, previously 6.8    Current management, blood sugar patterns and problems:  Although she did not have her meter on her previous visit today it could not be downloaded for detailed analysis  She says that she has been going off her diet because of taste preferences and not liking foods like chicken and [place] but preferring beef  Also for carbohydrates she is craving more potatoes and corn  She thinks she is a little stressed because of her overall health conditions and not able to follow her diet  Although she is still having high readings after meals she has not increased her insulin at mealtimes  Also at times will run out of boluses on her pump including for snacks  Highest blood sugars are after dinner  Has a couple of fasting  readings which are high and also her lab fasting glucose was 161  No hypoglycemia also  She changes her pump at the same time daily  She is not able to do much walking because of shortness of breath Her weight has gone up because of edema likely   Meals: 3 meals per day.  Breakfast: Oatmeal or egg or cereal at 8-9 am sometimes with sausage for protein, dinner 5 pm.;   Night snacks will be fruit or peanut butter crackers    Side effects from medications have been: Metformin: rash  Glucose monitoring:  done 1 time a day at least, mostly in the morning       Glucometer: Accu-Chek    Blood Glucose readings from review of meter:   PRE-MEAL Fasting Lunch Dinner Bedtime Overall  Glucose range:  159, 180  106     Mean/median:     173   POST-MEAL PC Breakfast PC Lunch PC Dinner  Glucose range:   175-211  165-279  Mean/median:       Prior  PRE-MEAL Fasting Lunch Dinner Bedtime Overall  Glucose range: 90-150      Mean/median:     ?   POST-MEAL PC Breakfast PC Lunch PC Dinner  Glucose range: 154  166, 187  Mean/median:       Glycemic control:    Lab Results  Component Value Date   HGBA1C 7.5 (H) 03/28/2020   HGBA1C 6.8 12/24/2019   HGBA1C 7.6 (H) 10/02/2019   Lab Results  Component Value Date   MICROALBUR <0.7 10/02/2019   LDLCALC 82 12/24/2019   CREATININE 0.86 03/28/2020    Self-care: The diet that the patient has been following is: tries to limit fats            Dietician visit: Most recent: 2/19   CDE visit: 9/16    Weight history:  Wt Readings from Last 3 Encounters:  04/03/20 237 lb 3.2 oz (107.6 kg)  01/03/20 231 lb 9.6 oz (105.1 kg)  01/31/19 235 lb (106.6 kg)    Other active problems: See review of systems    Allergies as of 04/03/2020      Reactions   Flagyl [metronidazole Hcl] Itching, Rash   Ciprofloxacin Itching, Rash   Metformin And Related Rash   Milk-related Compounds Other (See Comments)   Stomach pains   Other Other (See Comments)    [place] nuts cause severe facial redness      Medication List       Accurate as of April 03, 2020  9:43 PM. If you have any questions, ask your nurse or doctor.        Accu-Chek Guide test strip Generic drug: glucose blood Use 1-4 times daily as needed/instructed DX E11.9   Accu-Chek Guide w/Device Kit Use 1-4 times daily as needed/directed  DX E11.9   aspirin 81 MG EC tablet Take 1 tablet (81 mg total) by mouth daily.   betamethasone dipropionate 0.05 % cream Apply 1 application topically 2 (two) times daily as needed (rash).   bisoprolol 10 MG tablet Commonly known as: ZEBETA 2 tablets   carbamazepine 200 MG 12 hr capsule Commonly known as: CARBATROL Take 200 mg by mouth 2 (two) times daily.   clobetasol 0.05 % Gel Commonly known as: TEMOVATE Apply 1 application topically 2 (two) times daily as needed (rash).   diltiazem 360 MG 24 hr capsule Commonly known as: CARDIZEM CD Take 1 capsule (360 mg total) by mouth at bedtime.   donepezil 10 MG tablet Commonly known as: ARICEPT Take 1 tablet by mouth at bedtime.   esomeprazole 40 MG capsule Commonly known as: NexIUM Take 1 capsule (40 mg total) by mouth daily at 12 noon.   Farxiga 10 MG Tabs tablet Generic drug: dapagliflozin propanediol TAKE ONE TABLET BY MOUTH DAILY *REPLACES INVOKANA   dapagliflozin propanediol 10 MG Tabs tablet Commonly known as: FARXIGA Take 1 tablet (10 mg total) by mouth daily. Generic only   folic acid 1 MG tablet Commonly known as: FOLVITE Take 1 mg by mouth daily.   furosemide 40 MG tablet Commonly known as: LASIX Take 1 tablet (40 mg total) by mouth daily.   gabapentin 300 MG capsule Commonly known as: NEURONTIN Take 300 mg by mouth 2 (two) times daily.   insulin aspart 100 UNIT/ML injection Commonly known as: novoLOG INJECT 80 UNITS INTO THE SKIN. SEE ADMIN INSTRUCIOTNS. USE TO FILL V-GO WITH 80 UNITS DAILY What changed: Another medication with the same name was added.  Make sure you understand how and when to take each. Changed by: Elayne Snare, MD   NovoLOG FlexPen 100 UNIT/ML FlexPen Generic drug: insulin aspart Inject 14-16 units before meals as directed What changed: You were already taking a medication with the same name, and this prescription was added. Make sure you understand how and when to take each. Changed by:  Elayne Snare, MD   Insulin Pen Needle 33G X 5 MM Misc Commonly known as: Comfort EZ Pen Needles 1 each by Does not apply route daily. Use with Tresiba pen   Insulin Pen Needle 32G X 5 MM Misc 1 each by Does not apply route daily. Use with Tyler Aas pen   BD Pen Needle Nano U/F 32G X 4 MM Misc Generic drug: Insulin Pen Needle USE 8 PEN NEEDLES PER DAY   isosorbide mononitrate 30 MG 24 hr tablet Commonly known as: IMDUR TAKE ONE TABLET BY MOUTH DAILY (NEED OFFICE VISIT FOR REFILLS)   levothyroxine 75 MCG tablet Commonly known as: SYNTHROID Take 75 mcg by mouth daily before breakfast.   methotrexate 2.5 MG tablet Commonly known as: RHEUMATREX Take 7.5 mg by mouth every Monday. For rash   metoprolol tartrate 25 MG tablet Commonly known as: LOPRESSOR TAKE ONE TABLET BY MOUTH TWICE A DAY   OLANZapine 7.5 MG tablet Commonly known as: ZYPREXA Take 35 mg by mouth at bedtime.   OLANZapine 20 MG tablet Commonly known as: ZYPREXA Take 20 mg by mouth at bedtime.   OLANZapine zydis 10 MG disintegrating tablet Commonly known as: ZYPREXA Take 10 mg by mouth at bedtime.   ondansetron 8 MG tablet Commonly known as: Zofran Take 1 tablet (8 mg total) by mouth every 8 (eight) hours as needed for nausea.   OneTouch Delica Lancets 11H Misc USE LANCET TO TEST FIVE TIMES A DAY   Accu-Chek Softclix Lancets lancets Use 1-4 times daily as needed/instructed  DX E11.9   pravastatin 40 MG tablet Commonly known as: PRAVACHOL Take 1 tablet (40 mg total) by mouth daily.   QUEtiapine 25 MG tablet Commonly known as: SEROQUEL Take 4 tablets  by mouth at bedtime.   Tyler Aas FlexTouch 100 UNIT/ML FlexTouch Pen Generic drug: insulin degludec INJECT 25 UNITS UNDER THE SKIN ONCE DAILY   V-Go 40 Kit USE 1 KIT DAILY AS DIRECTED   Victoza 18 MG/3ML Sopn Generic drug: liraglutide Inject 1.8 mg daily   Vimpat 50 MG Tabs tablet Generic drug: lacosamide Take 50 mg by mouth 2 (two) times daily.       Allergies:  Allergies  Allergen Reactions  . Flagyl [Metronidazole Hcl] Itching and Rash  . Ciprofloxacin Itching and Rash  . Metformin And Related Rash  . Milk-Related Compounds Other (See Comments)    Stomach pains  . Other Other (See Comments)    [place] nuts cause severe facial redness    Past Medical History:  Diagnosis Date  . Bipolar disorder ([place])   . Depression    Bipolar  . GERD (gastroesophageal reflux disease)   . History of hiatal hernia   . History of kidney stones   . History of stomach ulcers 1970s   "bleeding"  . Hyperlipidemia   . Hypertension   . Hypothyroidism   . Ischemic colitis, enteritis, or enterocolitis ([place])   . OSA on CPAP   . Pericarditis 05/06/2017  . Pneumonia    "several times" (06/23/2017)  . Thyroid disease   . TIA (transient ischemic attack) 2011 X 2  . Type II diabetes mellitus ([place])   . Urinary bladder incontinence   . Vertigo     Past Surgical History:  Procedure Laterality Date  . APPENDECTOMY    . BREAST CYST ASPIRATION Bilateral   . CARDIAC CATHETERIZATION N/A 12/24/2014   Procedure: Right/Left Heart Cath and Coronary Angiography;  Surgeon: Sanda Klein, MD;  Location: [facility];  Service:  Cardiovascular;  Laterality: N/A;  . CATARACT EXTRACTION W/ INTRAOCULAR LENS  IMPLANT, BILATERAL Bilateral   . EXCISIONAL HEMORRHOIDECTOMY    . EYE SURGERY    . FRACTURE SURGERY    . IR THORACENTESIS ASP PLEURAL SPACE W/IMG GUIDE  05/13/2017  . LAPAROSCOPIC CHOLECYSTECTOMY    . LITHOTRIPSY  "several times"  . RETINAL DETACHMENT SURGERY     "think it was on the left; not  sure" (06/23/2017)  . TONSILLECTOMY    . VAGINAL HYSTERECTOMY     partial     Family History  Problem Relation Age of Onset  . Heart disease Mother   . Stroke Father   . Parkinson's disease Father   . Cancer Sister   . Cancer Brother     Social History:  reports that she has never smoked. She has never used smokeless tobacco. She reports that she does not drink alcohol and does not use drugs.    Review of Systems    NEUROPATHY:  Taking gabapentin      Lipids: Last LDL from PCP was 82, followed by PCP       Lab Results  Component Value Date   CHOL 149 03/26/2017   HDL 56 12/24/2019   LDLCALC 82 12/24/2019   TRIG 130 03/26/2017   CHOLHDL 3.0 03/26/2017       Thyroid:   Has had presumed hypothyroidism for over 20 years, has not had a change in her 75 mcg levothyroxine dose in several years Last TSH was 2.01 in 11/21, followed by PCP also   Lab Results  Component Value Date   TSH 3.12 10/02/2019   TSH 3.59 08/16/2018   TSH 0.71 10/17/2017   FREET4 1.04 05/05/2017   FREET4 0.86 01/31/2014   FREET4 0.91 08/28/2013       The blood pressure has been managed by PCP with a combination of diltiazem  and bisoprolol Blood pressure is better today     BP Readings from Last 3 Encounters:  04/03/20 140/82  01/03/20 (!) 160/98  12/23/17 110/76     Physical Examination:  BP 140/82 (BP Location: Left Arm, Patient Position: Sitting, Cuff Size: Normal)   Pulse 73   Resp 20   Ht _0  (1.6 m)   Wt 237 lb 3.2 oz (107.6 kg)   SpO2 95%   BMI 42.02 kg/m      ASSESSMENT/PLAN:   Diabetes type 2, with obesity  See history of present illness for detailed discussion of his current management, blood sugar patterns and problems identified  A1c is 7.5 and higher  She is on a V-go pump using NovoLog along with additional Tresiba basal insulin supplement and also [place] and Victoza  She has not been able to follow her diet and this is likely the reason for her  worsening control Recent average blood sugar 173 at home including some postprandial readings Likely also needs higher basal rate but she is still not wanting to switch to the OmniPod  Recently also not able to walk She is consistent with her Victoza but is craving more carbohydrates  Recommendations: She will increase all her insulin doses as follows In order to get enough boluses on her pump she will start using NovoLog with the FlexPen and take 14 to 16 units based on her meal size instead of 12 units with the pump Also will consider freestyle libre although she again does not want to use any higher technology devices  Patient instructions as follows: TRESIBA insulin: Increased  to 32 units On a weekly basis adjust the dose up or down 2 units after morning sugars are staying over 130 or going below 90  NOVOLOG/aspart insulin: Start using the insulin pen to give 14 to 16 units to cover evening meal based on how much carbohydrates you are having Blood sugar should be under 180 after the meal  For breakfast do 6 clicks, lunchtime dose 6-7 clicks and 2-3 clicks for any carbohydrate snacks   HYPONATREMIA: Sodium borderline at 134 now  Hypothyroidism: Last TSH normal with PCP on 75 mcg levothyroxine  Edema and shortness of breath: Likely has no CHF and needs to be reviewed by her cardiologist, recommended calling them  Follow-up in 2 months   Patient Instructions  TRESIBA insulin: Increased to 32 units On a weekly basis adjust the dose up or down 2 units after morning sugars are staying over 130 or going below 90  NOVOLOG/aspart insulin: Start using the insulin pen to give 14 to 16 units to cover evening meal based on how much carbohydrates you are having Blood sugar should be under 180 after the meal  For breakfast do 6 clicks, lunchtime dose 6-7 clicks and 2-3 clicks for any carbohydrate snacks  Please call your cardiologist regarding shortness of breath and leg swelling and  move up your appointment     Elayne Snare 04/03/2020, 9:43 PM   Note: This office note was prepared with Dragon voice recognition system technology. Any transcriptional errors that result from this process are unintentional.

## 2020-04-11 ENCOUNTER — Other Ambulatory Visit: Payer: Self-pay

## 2020-04-11 MED ORDER — FUROSEMIDE 40 MG PO TABS: 40.0000 mg | ORAL_TABLET | Freq: Every day | ORAL | 0 refills | Status: DC

## 2020-04-16 ENCOUNTER — Encounter: Payer: Self-pay | Admitting: Cardiovascular Disease

## 2020-04-16 ENCOUNTER — Other Ambulatory Visit: Payer: Self-pay

## 2020-04-16 ENCOUNTER — Ambulatory Visit (INDEPENDENT_AMBULATORY_CARE_PROVIDER_SITE_OTHER): Payer: Medicare PPO | Admitting: Cardiovascular Disease

## 2020-04-16 VITALS — BP 150/70 | HR 73 | Ht 63.0 in | Wt 237.0 lb

## 2020-04-16 DIAGNOSIS — I872 Venous insufficiency (chronic) (peripheral): Secondary | ICD-10-CM | POA: Diagnosis not present

## 2020-04-16 DIAGNOSIS — R0602 Shortness of breath: Secondary | ICD-10-CM | POA: Diagnosis not present

## 2020-04-16 NOTE — Patient Instructions (Signed)
Medication Instructions:  No changes *If you need a refill on your cardiac medications before your next appointment, please call your pharmacy*   Lab Work: None ordered If you have labs (blood work) drawn today and your tests are completely normal, you will receive your results only by: Marland Kitchen MyChart Message (if you have MyChart) OR . A paper copy in the mail If you have any lab test that is abnormal or we need to change your treatment, we will call you to review the results.   Testing/Procedures: Your physician has requested that you have an echocardiogram. Echocardiography is a painless test that uses sound waves to create images of your heart. It provides your doctor with information about the size and shape of your heart and how well your heart's chambers and valves are working. You may receive an ultrasound enhancing agent through an IV if needed to better visualize your heart during the echo.This procedure takes approximately one hour. There are no restrictions for this procedure. This will take place at the [address].   Your physician has requested that you have a lower extremity venous duplex. This test is an ultrasound of the veins in the legs. It looks at venous blood flow that carries blood from the heart to the legs. Allow one hour for a Lower Venous exam. There are no restrictions or special instructions.   Follow-Up: At [facility], you and your health needs are our priority.  As part of our continuing mission to provide you with exceptional heart care, we have created designated Provider Care Teams.  These Care Teams include your primary Cardiologist (physician) and Advanced Practice Providers (APPs -  Physician Assistants and Nurse Practitioners) who all work together to provide you with the care you need, when you need it.  We recommend signing up for the patient portal called "MyChart".  Sign up information is provided on this After Visit Summary.  MyChart is  used to connect with patients for Virtual Visits (Telemedicine).  Patients are able to view lab/test results, encounter notes, upcoming appointments, etc.  Non-urgent messages can be sent to your provider as well.   To learn more about what you can do with MyChart, go to [email or web address].    Your next appointment:   12 month(s)  The format for your next appointment:   In Person  Provider:   You may see Thurmon Fair, MD or one of the following Advanced Practice Providers on your designated Care Team:    Azalee Course, PA-C  Micah Flesher, PA-C or   Judy Pimple, [place]

## 2020-04-16 NOTE — Progress Notes (Signed)
Cardiology Office Note    Date:  04/17/2020   ID:  [name], DOB 1940/12/03, MRN [id number]  PCP:  Merrilee Seashore, MD  Cardiologist:   Sanda Klein, MD   Chief Complaint  Patient presents with  . Leg Swelling    History of Present Illness:  [name] is a 80 y.o. female with obesity, type 2 diabetes mellitus, previous history of TIA and possible ischemic colitis, hypertension. She has normal left ventricular systolic function and regional wall motion without valvular abnormalities.  She has struggled over the last several months with a protracted course of acute pericarditis, finally resolved both clinically and by echocardiographic follow-up.  Cardiac MRI in April 2019 showed normal left ventricular systolic function with mild LVH and no evidence of late gadolinium enhancement.  Last echo on August 10, 2017 showed complete resolution of the pericardial effusion.  She had normal coronary arteries by catheterization in 2016.  Her current complaint is bilateral lower extremity edema.  It is uncomfortable, but not painful.  The edema is present even though she is wearing compression stockings and has been carefully following a low-sodium diet.  She has not been sedentary.  She walks at least 15 minutes a day covering about half a mile each time, she does become short of breath with activity.  She is not receiving diuretics, but has started treatment with [facility] Glade for diabetes.  She is also taking diltiazem which could be causing the edema, but this is a longstanding prescription.  She denies orthopnea or PND.  She has not had problems with chest pain or exertional dyspnea.  She denies palpitations, dizziness, syncope, focal neurological events or leg pain when she walks.   Past Medical History:  Diagnosis Date  . Bipolar disorder ([place])   . Depression    Bipolar  . GERD (gastroesophageal reflux disease)   . History of hiatal hernia   . History of kidney stones   .  History of stomach ulcers 1970s   "bleeding"  . Hyperlipidemia   . Hypertension   . Hypothyroidism   . Ischemic colitis, enteritis, or enterocolitis ([place])   . OSA on CPAP   . Pericarditis 05/06/2017  . Pneumonia    "several times" (06/23/2017)  . Thyroid disease   . TIA (transient ischemic attack) 2011 X 2  . Type II diabetes mellitus ([place])   . Urinary bladder incontinence   . Vertigo     Past Surgical History:  Procedure Laterality Date  . APPENDECTOMY    . BREAST CYST ASPIRATION Bilateral   . CARDIAC CATHETERIZATION N/A 12/24/2014   Procedure: Right/Left Heart Cath and Coronary Angiography;  Surgeon: Sanda Klein, MD;  Location: [facility];  Service: Cardiovascular;  Laterality: N/A;  . CATARACT EXTRACTION W/ INTRAOCULAR LENS  IMPLANT, BILATERAL Bilateral   . EXCISIONAL HEMORRHOIDECTOMY    . EYE SURGERY    . FRACTURE SURGERY    . IR THORACENTESIS ASP PLEURAL SPACE W/IMG GUIDE  05/13/2017  . LAPAROSCOPIC CHOLECYSTECTOMY    . LITHOTRIPSY  "several times"  . RETINAL DETACHMENT SURGERY     "think it was on the left; not sure" (06/23/2017)  . TONSILLECTOMY    . VAGINAL HYSTERECTOMY     partial     Current Medications: Outpatient Medications Prior to Visit  Medication Sig Dispense Refill  . Accu-Chek Softclix Lancets lancets Use 1-4 times daily as needed/instructed  DX E11.9 360 each 3  . aspirin EC 81 MG EC tablet Take 1  tablet (81 mg total) by mouth daily. 30 tablet 0  . betamethasone dipropionate (DIPROLENE) 0.05 % cream Apply 1 application topically 2 (two) times daily as needed (rash).     . bisoprolol (ZEBETA) 10 MG tablet     . Blood Glucose Monitoring Suppl (ACCU-CHEK GUIDE) w/Device KIT Use 1-4 times daily as needed/directed  DX E11.9 1 kit 1  . carbamazepine (CARBATROL) 200 MG 12 hr capsule Take 200 mg by mouth 2 (two) times daily.     . clobetasol (TEMOVATE) 0.05 % GEL Apply 1 application topically 2 (two) times daily as needed (rash).     . dapagliflozin  propanediol (FARXIGA) 10 MG TABS tablet Take 1 tablet (10 mg total) by mouth daily. Generic only 30 tablet 1  . diltiazem (CARDIZEM CD) 360 MG 24 hr capsule Take 1 capsule (360 mg total) by mouth at bedtime. 30 capsule 0  . donepezil (ARICEPT) 10 MG tablet Take 1 tablet by mouth at bedtime.    Marland Kitchen esomeprazole (NEXIUM) 40 MG capsule Take 1 capsule (40 mg total) by mouth daily at 12 noon. 30 capsule 2  . folic acid (FOLVITE) 1 MG tablet Take 1 mg by mouth daily.    . furosemide (LASIX) 40 MG tablet Take 1 tablet (40 mg total) by mouth daily. (Patient taking differently: Take 40 mg by mouth daily. As Needed) 4 tablet 0  . gabapentin (NEURONTIN) 300 MG capsule Take 300 mg by mouth daily.    Marland Kitchen glucose blood (ACCU-CHEK GUIDE) test strip Use 1-4 times daily as needed/instructed DX E11.9 300 each 12  . insulin aspart (NOVOLOG FLEXPEN) 100 UNIT/ML FlexPen Inject 14-16 units before meals as directed 15 mL 1  . insulin aspart (NOVOLOG) 100 UNIT/ML injection INJECT 80 UNITS INTO THE SKIN. SEE ADMIN INSTRUCIOTNS. USE TO FILL V-GO WITH 80 UNITS DAILY 10 mL 3  . insulin degludec (TRESIBA FLEXTOUCH) 100 UNIT/ML FlexTouch Pen Inject 32 Units into the skin daily. Takes daily in the AM    . Insulin Disposable Pump (V-GO 40) KIT USE 1 KIT DAILY AS DIRECTED 1 kit 5  . Insulin Pen Needle (BD PEN NEEDLE NANO U/F) 32G X 4 MM MISC USE 8 PEN NEEDLES PER DAY 200 each 0  . Insulin Pen Needle (COMFORT EZ PEN NEEDLES) 33G X 5 MM MISC 1 each by Does not apply route daily. Use with Tresiba pen 100 each 0  . Insulin Pen Needle 32G X 5 MM MISC 1 each by Does not apply route daily. Use with Tresiba pen 100 each 0  . isosorbide mononitrate (IMDUR) 30 MG 24 hr tablet TAKE ONE TABLET BY MOUTH DAILY (NEED OFFICE VISIT FOR REFILLS) 30 tablet 0  . levothyroxine (SYNTHROID, LEVOTHROID) 75 MCG tablet Take 75 mcg by mouth daily before breakfast.    . liraglutide (VICTOZA) 18 MG/3ML SOPN Inject 1.8 mg daily 9 mL 3  . methotrexate (RHEUMATREX)  2.5 MG tablet Take 7.5 mg by mouth every Monday. For rash    . metoprolol tartrate (LOPRESSOR) 25 MG tablet TAKE ONE TABLET BY MOUTH TWICE A DAY 180 tablet 2  . OLANZapine (ZYPREXA) 20 MG tablet Take 20 mg by mouth at bedtime.    Marland Kitchen OLANZapine (ZYPREXA) 7.5 MG tablet Take 35 mg by mouth at bedtime.    Marland Kitchen OLANZapine zydis (ZYPREXA) 10 MG disintegrating tablet Take 10 mg by mouth at bedtime.    . ondansetron (ZOFRAN) 8 MG tablet Take 1 tablet (8 mg total) by mouth every 8 (eight) hours  as needed for nausea. 9 tablet 0  . ONETOUCH DELICA LANCETS 13K MISC USE LANCET TO TEST FIVE TIMES A DAY 200 each 0  . pravastatin (PRAVACHOL) 40 MG tablet Take 1 tablet (40 mg total) by mouth daily. 30 tablet 0  . QUEtiapine (SEROQUEL) 25 MG tablet Take 4 tablets by mouth at bedtime.    Marland Kitchen VIMPAT 50 MG TABS tablet Take 50 mg by mouth 2 (two) times daily.    Marland Kitchen FARXIGA 10 MG TABS tablet TAKE ONE TABLET BY MOUTH DAILY *REPLACES INVOKANA 90 tablet 1  . TRESIBA FLEXTOUCH 100 UNIT/ML FlexTouch Pen INJECT 25 UNITS UNDER THE SKIN ONCE DAILY (Patient taking differently: 32 Units. INJECT 32 UNITS UNDER THE SKIN ONCE DAILY) 36 mL 1   No facility-administered medications prior to visit.     Allergies:   Flagyl [metronidazole hcl], Ciprofloxacin, Metformin and related, Milk-related compounds, and Other   Social History   Socioeconomic History  . Marital status: Widowed    Spouse name: Not on file  . Number of children: 2  . Years of education: 57  . Highest education level: Not on file  Occupational History  . Occupation: Retired  Tobacco Use  . Smoking status: Never Smoker  . Smokeless tobacco: Never Used  Vaping Use  . Vaping Use: Never used  Substance and Sexual Activity  . Alcohol use: Never  . Drug use: Never  . Sexual activity: Not on file  Other Topics Concern  . Not on file  Social History Narrative   Lives at home alone.   Right-handed.   No caffeine use.   Social Determinants of Health    Financial Resource Strain: Not on file  Food Insecurity: Not on file  Transportation Needs: Not on file  Physical Activity: Not on file  Stress: Not on file  Social Connections: Not on file     Family History:  The patient's family history includes Cancer in her brother and sister; Heart disease in her mother; Parkinson's disease in her father; Stroke in her father.   ROS:   Please see the history of present illness.   All other systems are reviewed and are negative.   PHYSICAL EXAM:   VS:  BP (!) 150/70   Pulse 73   Ht 5' 3"  (1.6 m)   Wt 237 lb (107.5 kg)   BMI 41.98 kg/m      General: Alert, oriented x3, no distress, morbidly obese Head: no evidence of trauma, PERRL, EOMI, no exophtalmos or lid lag, no myxedema, no xanthelasma; normal ears, nose and oropharynx Neck: Very difficult this evening jugular veins due to body habitus, but I do not see any evidence of overt elevation in jugular venous pulsations or hepatojugular reflux; brisk carotid pulses without delay and no carotid bruits Chest: clear to auscultation, no signs of consolidation by percussion or palpation, normal fremitus, symmetrical and full respiratory excursions Cardiovascular: normal position and quality of the apical impulse, regular rhythm, normal first and second heart sounds, no murmurs, rubs or gallops Abdomen: no tenderness or distention, no masses by palpation, no abnormal pulsatility or arterial bruits, normal bowel sounds, no hepatosplenomegaly Extremities: no clubbing, cyanosis; she has symmetrical 2-3+ edema to the knees bilaterally.  She has a very swollen feet.  There is no redness or tenderness.  Varicose veins are present.; 2+ radial, ulnar and brachial pulses bilaterally; 2+ right femoral, posterior tibial and dorsalis pedis pulses; 2+ left femoral, posterior tibial and dorsalis pedis pulses; no subclavian or femoral  bruits Neurological: grossly nonfocal Psych: Normal mood and affect   Wt  Readings from Last 3 Encounters:  04/16/20 237 lb (107.5 kg)  04/03/20 237 lb 3.2 oz (107.6 kg)  01/03/20 231 lb 9.6 oz (105.1 kg)      Studies/Labs Reviewed:   ECHO 08/10/2017:  - Left ventricle: The cavity size was normal. Wall thickness was  increased in a pattern of mild LVH. Systolic function was  vigorous. The estimated ejection fraction was in the range of 65%  to 70%. Wall motion was normal; there were no regional wall  motion abnormalities. Doppler parameters are consistent with  abnormal left ventricular relaxation (grade 1 diastolic  dysfunction). The E/e&' ratio is between 8-15, suggesting  indeterminate LV filling pressure.  - Left atrium: The atrium was normal in size.  - Inferior vena cava: The vessel was normal in size. The  respirophasic diameter changes were in the normal range (>= 50%),  consistent with normal central venous pressure.    ECHO 06/23/2017:  - Left ventricle: The cavity size was normal. Wall thickness was  increased in a pattern of moderate LVH. Systolic function was  normal. The estimated ejection fraction was in the range of 60%  to 65%. Wall motion was normal; there were no regional wall  motion abnormalities. Left ventricular diastolic function  parameters were normal.  - Aortic valve: Valve area (Vmax): 2.33 cm^2.  - Left atrium: The atrium was mildly dilated.  - Atrial septum: No defect or patent foramen ovale was identified.    ECHO 05/04/2017:  - Left ventricle: The cavity size was normal. Systolic function was  normal. The estimated ejection fraction was in the range of 60%  to 65%. Wall motion was normal; there were no regional wall  motion abnormalities. Doppler parameters are consistent with  abnormal left ventricular relaxation (grade 1 diastolic  dysfunction).  - Pericardium, extracardiac: There was moderate thickening. A  moderate pericardial effusion was identified circumferential  to  the heart. There was no evidence of hemodynamic compromise. There  was no chamber collapse. There was evidence for mildly increased  RV-LV interaction demonstrated by respirophasic changes in  transmitral velocities. Features were not consistent with  tamponade physiology.    EKG:  EKG is ordered today.  It shows normal sinus rhythm and left axis deviation at about -30 degrees and poor R wave progression in the anterior leads, isolated T wave inversion in leads I and 8L (in the past she has had full-blown left anterior fascicular block, very similar to today's tracing)  Recent Labs: 10/02/2019: ALT 25; TSH 3.12 03/28/2020: BUN 13; Creatinine, Ser 0.86; Potassium 4.6; Sodium 134   Lipid Panel    Component Value Date/Time   CHOL 149 03/26/2017 1710   TRIG 130 03/26/2017 1710   HDL 56 12/24/2019 0000   CHOLHDL 3.0 03/26/2017 1710   VLDL 26 03/26/2017 1710   LDLCALC 82 12/24/2019 0000      ASSESSMENT:    1. Shortness of breath   2. Venous reflux      PLAN:  In order of problems listed above:  1. Lower extremity edema : This is symmetrical and relatively soft , does not look like DVT.  He could well be attributed to her treatment with a calcium channel blocker, but she has been on that medication for 3 years without this problem.  We will check an echocardiogram to look for constrictive pericarditis since she had a protracted episode of acute pericarditis about 6 years ago.  Also will evaluate right heart function since she does have a history of morbid obesity and sleep.  However, I suspect it is most likely that her edema is due to peripheral venous insufficiency and we will schedule her for a duplex ultrasound looking for reflux.  2. History of acute pericarditis: Was never clear whether this was related to a viral illness or rheumatological disorder and has had a rather protracted course.  It does put her at risk for developing constriction, although her physical exam  does not support this diagnosis and it would be remarkably rapid progression from the acute inflammatory event to full constrictive pericarditis. 3. HTN: Controlled.  Did not tolerate metoprolol well.  In the past, she has had a lot of difficulty ever getting her blood pressure control except when using a calcium channel blocker. 4. HLP: Lipid parameters in target range based on labs from last December. 5. OSA:  Sees Dr. Elsworth Soho.  Reports compliance with CPAP.  Unfortunately she has gained additional weight (some maybe fluid gain, but I suspect she has real weight gain as well).  Medication Adjustments/Labs and Tests Ordered: Current medicines are reviewed at length with the patient today.  Concerns regarding medicines are outlined above.  Medication changes, Labs and Tests ordered today are listed in the Patient Instructions below. Patient Instructions  Medication Instructions:  No changes *If you need a refill on your cardiac medications before your next appointment, please call your pharmacy*   Lab Work: None ordered If you have labs (blood work) drawn today and your tests are completely normal, you will receive your results only by: Marland Kitchen MyChart Message (if you have MyChart) OR . A paper copy in the mail If you have any lab test that is abnormal or we need to change your treatment, we will call you to review the results.   Testing/Procedures: Your physician has requested that you have an echocardiogram. Echocardiography is a painless test that uses sound waves to create images of your heart. It provides your doctor with information about the size and shape of your heart and how well your heart's chambers and valves are working. You may receive an ultrasound enhancing agent through an IV if needed to better visualize your heart during the echo.This procedure takes approximately one hour. There are no restrictions for this procedure. This will take place at the [address], [address].    Your physician has requested that you have a lower extremity venous duplex. This test is an ultrasound of the veins in the legs. It looks at venous blood flow that carries blood from the heart to the legs. Allow one hour for a Lower Venous exam. There are no restrictions or special instructions.   Follow-Up: At [facility], you and your health needs are our priority.  As part of our continuing mission to provide you with exceptional heart care, we have created designated Provider Care Teams.  These Care Teams include your primary Cardiologist (physician) and Advanced Practice Providers (APPs -  Physician Assistants and Nurse Practitioners) who all work together to provide you with the care you need, when you need it.  We recommend signing up for the patient portal called "MyChart".  Sign up information is provided on this After Visit Summary.  MyChart is used to connect with patients for Virtual Visits (Telemedicine).  Patients are able to view lab/test results, encounter notes, upcoming appointments, etc.  Non-urgent messages can be sent to your provider as well.   To  learn more about what you can do with MyChart, go to [email or web address].    Your next appointment:   12 month(s)  The format for your next appointment:   In Person  Provider:   You may see Sanda Klein, MD or one of the following Advanced Practice Providers on your designated Care Team:    Almyra Deforest, PA-C  Fabian Sharp, [place] or   Roby Lofts, PA-C      Signed, Sanda Klein, MD  04/17/2020 12:13 PM    [address], [place], [place]  [address] Phone: [phone]; Fax: [phone]

## 2020-04-17 ENCOUNTER — Ambulatory Visit (HOSPITAL_COMMUNITY)
Admission: RE | Admit: 2020-04-17 | Discharge: 2020-04-17 | Disposition: A | Discharge status: Home / Self Care | Payer: Medicare PPO | Source: Ambulatory Visit | Attending: Cardiology | Admitting: Cardiology

## 2020-04-17 ENCOUNTER — Encounter: Payer: Self-pay | Admitting: Cardiovascular Disease

## 2020-04-17 DIAGNOSIS — R600 Localized edema: Secondary | ICD-10-CM

## 2020-04-17 DIAGNOSIS — I872 Venous insufficiency (chronic) (peripheral): Secondary | ICD-10-CM | POA: Diagnosis not present

## 2020-04-18 ENCOUNTER — Telehealth: Payer: Self-pay | Admitting: Cardiovascular Disease

## 2020-04-18 ENCOUNTER — Encounter: Payer: Self-pay | Admitting: *Deleted

## 2020-04-18 DIAGNOSIS — R0602 Shortness of breath: Secondary | ICD-10-CM

## 2020-04-18 DIAGNOSIS — I10 Essential (primary) hypertension: Secondary | ICD-10-CM

## 2020-04-18 MED ORDER — FUROSEMIDE 40 MG PO TABS: 40.0000 mg | ORAL_TABLET | Freq: Every day | ORAL | 4 refills | Status: DC

## 2020-04-18 MED ORDER — POTASSIUM CHLORIDE CRYS ER 10 MEQ PO TBCR: 10.0000 meq | EXTENDED_RELEASE_TABLET | Freq: Every day | ORAL | 3 refills | Status: DC

## 2020-04-18 NOTE — Telephone Encounter (Signed)
*  STAT* If patient is at the pharmacy, call can be transferred to refill team.   1. Which medications need to be refilled? (please list name of each medication and dose if known)  furosemide (LASIX) 40 MG tablet  2. Which pharmacy/location (including street and city if local pharmacy) is medication to be sent to?  [facility] [address], [place] - [address]  3. Do they need a 30 day or 90 day supply? 90

## 2020-04-18 NOTE — Telephone Encounter (Signed)
Medications have been sent in for the patient  Duplex results:  There is really no sign of either obstruction or reflux in the veins in either leg. This suggests that her swelling is due to overall fluid excess. Diltiazem can cause volume retention and ankle swelling, but is not a new medication for her.  Please start furosemide 40 mg once daily and potassium chloride 10 mEq once daily.   Please check BMET in 2 weeks  Should get a better idea from the echocardiogram why this is happening and we will touch base again when we have that information.

## 2020-04-30 ENCOUNTER — Other Ambulatory Visit: Payer: Self-pay | Admitting: Cardiovascular Disease

## 2020-05-01 DIAGNOSIS — R0602 Shortness of breath: Secondary | ICD-10-CM | POA: Diagnosis not present

## 2020-05-01 DIAGNOSIS — I10 Essential (primary) hypertension: Secondary | ICD-10-CM | POA: Diagnosis not present

## 2020-05-01 LAB — BASIC METABOLIC PANEL
BUN/Creatinine Ratio: 20 (ref 12–28)
BUN: 18 mg/dL (ref 8–27)
CO2: 27 mmol/L (ref 20–29)
Calcium: 9.8 mg/dL (ref 8.7–10.3)
Chloride: 98 mmol/L (ref 96–106)
Creatinine, Ser: 0.9 mg/dL (ref 0.57–1.00)
Glucose: 135 mg/dL — ABNORMAL HIGH (ref 65–99)
Potassium: 4.7 mmol/L (ref 3.5–5.2)
Sodium: 140 mmol/L (ref 134–144)
eGFR: 65 mL/min/{1.73_m2} (ref >59)

## 2020-05-05 ENCOUNTER — Other Ambulatory Visit: Payer: Self-pay | Admitting: *Deleted

## 2020-05-05 MED ORDER — DAPAGLIFLOZIN PROPANEDIOL 10 MG PO TABS: 10.0000 mg | ORAL_TABLET | Freq: Every day | ORAL | 5 refills | Status: DC

## 2020-05-06 ENCOUNTER — Other Ambulatory Visit: Payer: Self-pay | Admitting: *Deleted

## 2020-05-06 MED ORDER — TRESIBA FLEXTOUCH 100 UNIT/ML ~~LOC~~ SOPN: 32.0000 [IU] | PEN_INJECTOR | Freq: Every day | SUBCUTANEOUS | 5 refills | Status: DC

## 2020-05-08 ENCOUNTER — Other Ambulatory Visit: Payer: Self-pay | Admitting: *Deleted

## 2020-05-08 MED ORDER — VICTOZA 18 MG/3ML ~~LOC~~ SOPN: PEN_INJECTOR | SUBCUTANEOUS | 3 refills | Status: DC

## 2020-05-14 DIAGNOSIS — E782 Mixed hyperlipidemia: Secondary | ICD-10-CM | POA: Diagnosis not present

## 2020-05-14 DIAGNOSIS — I129 Hypertensive chronic kidney disease with stage 1 through stage 4 chronic kidney disease, or unspecified chronic kidney disease: Secondary | ICD-10-CM | POA: Diagnosis not present

## 2020-05-14 DIAGNOSIS — N182 Chronic kidney disease, stage 2 (mild): Secondary | ICD-10-CM | POA: Diagnosis not present

## 2020-05-14 DIAGNOSIS — E1165 Type 2 diabetes mellitus with hyperglycemia: Secondary | ICD-10-CM | POA: Diagnosis not present

## 2020-05-20 ENCOUNTER — Ambulatory Visit (HOSPITAL_COMMUNITY): Payer: Medicare PPO | Attending: Cardiovascular Disease

## 2020-05-20 ENCOUNTER — Other Ambulatory Visit: Payer: Self-pay

## 2020-05-20 DIAGNOSIS — R0602 Shortness of breath: Secondary | ICD-10-CM | POA: Diagnosis not present

## 2020-05-20 LAB — ECHOCARDIOGRAM COMPLETE
Area-P 1/2: 2.39 cm2
S' Lateral: 1.9 cm

## 2020-05-20 MED ORDER — PERFLUTREN LIPID MICROSPHERE
1.0000 mL | INTRAVENOUS | Status: AC | PRN
  Administered 2020-05-20: 2 mL via INTRAVENOUS

## 2020-05-23 DIAGNOSIS — E1165 Type 2 diabetes mellitus with hyperglycemia: Secondary | ICD-10-CM | POA: Diagnosis not present

## 2020-05-23 DIAGNOSIS — N1831 Chronic kidney disease, stage 3a: Secondary | ICD-10-CM | POA: Diagnosis not present

## 2020-05-23 DIAGNOSIS — I10 Essential (primary) hypertension: Secondary | ICD-10-CM | POA: Diagnosis not present

## 2020-05-23 DIAGNOSIS — E782 Mixed hyperlipidemia: Secondary | ICD-10-CM | POA: Diagnosis not present

## 2020-05-27 ENCOUNTER — Other Ambulatory Visit: Payer: Self-pay | Admitting: *Deleted

## 2020-05-27 ENCOUNTER — Telehealth: Payer: Self-pay | Admitting: Cardiovascular Disease

## 2020-05-27 DIAGNOSIS — I10 Essential (primary) hypertension: Secondary | ICD-10-CM

## 2020-05-27 DIAGNOSIS — R0602 Shortness of breath: Secondary | ICD-10-CM

## 2020-05-27 MED ORDER — INSULIN ASPART 100 UNIT/ML IJ SOLN: INTRAMUSCULAR | 3 refills | Status: DC

## 2020-05-27 NOTE — Telephone Encounter (Signed)
The echo abnormalities are mild. There is no cardiac reason for shortness of breath at rest, but the diastolic dysfunction may explain dyspnea with exertion.  Has the furosemide helped the breathing? Does she still have leg swelling? We can increase the furosemide dose if needed. As long as her sleep apnea is treated and her BP is normal, would not expect worsening. Losing additional weight would be of definite benefit (and she is doing a good job with that).

## 2020-05-27 NOTE — Telephone Encounter (Signed)
Let's increase the furosemide to 80 mg daily for just 2 days and talk to her again on Friday, please.

## 2020-05-27 NOTE — Telephone Encounter (Signed)
Patient has been made aware and verbalized her understanding. We will call back on Friday for an update.

## 2020-05-27 NOTE — Telephone Encounter (Signed)
Returned the call to the patient. She was calling to discuss her echo results further. She said she had three main questions concerning the results.   1. Does the echo explain shortness of breath? 2. Does anything need further treatment? 3. Is it progressive (shorntes of breath)?  Or can it be treated and improved.  She has been advised that the echo did not show any sign of excess fluid. She is currently on Furosemide 40 mg once daily.   She stated that she continues to have shortness of breath on exertion. She can ambulate 10 minutes and gets winded. She gets bilateral lower extremity edema which worsens throughout the day. The swelling does go down overnight. She stated that sometimes she does get out of breath when she lies down at night.   Her current weight is 234 pounds. She stated that she has actually lost weight intentionally. She watches her sodium intake.

## 2020-05-27 NOTE — Telephone Encounter (Signed)
Patient is requesting to go over her echo results again.

## 2020-05-28 ENCOUNTER — Other Ambulatory Visit: Payer: Self-pay | Admitting: *Deleted

## 2020-05-28 MED ORDER — V-GO 40 KIT: PACK | 5 refills | Status: DC

## 2020-05-29 DIAGNOSIS — Z5181 Encounter for therapeutic drug level monitoring: Secondary | ICD-10-CM | POA: Diagnosis not present

## 2020-05-29 DIAGNOSIS — L280 Lichen simplex chronicus: Secondary | ICD-10-CM | POA: Diagnosis not present

## 2020-05-29 DIAGNOSIS — Z79899 Other long term (current) drug therapy: Secondary | ICD-10-CM | POA: Diagnosis not present

## 2020-05-29 DIAGNOSIS — L299 Pruritus, unspecified: Secondary | ICD-10-CM | POA: Diagnosis not present

## 2020-05-29 DIAGNOSIS — I872 Venous insufficiency (chronic) (peripheral): Secondary | ICD-10-CM | POA: Diagnosis not present

## 2020-05-30 ENCOUNTER — Other Ambulatory Visit: Payer: Medicare PPO

## 2020-05-30 NOTE — Telephone Encounter (Signed)
Spoke with the patient. She stated that shortness of breath is the same as is the bilateral swelling. The shortness of breath is on exertion. The swelling does still improve overnight but does not totally diminish.   She has been compliant with her CPAP and wears her compression hose during the day. She has been advised to also watch her sodium intake.

## 2020-06-03 NOTE — Telephone Encounter (Signed)
Bring  her in, yes . Get BNP.

## 2020-06-03 NOTE — Telephone Encounter (Signed)
Patient will come in this week for lab work. Appointment made for 5/27 at 4 pm.

## 2020-06-03 NOTE — Telephone Encounter (Signed)
Call placed to the patient to check on her. She has been taking the furosemide 80 mg once daily through the weekend. She has been advised to increase her Potassium to 20 mEq.  She stated that even with the increase, she feels like the swelling is not better and may be getting worse. She still elevates her legs, wears compression stockings and watches her sodium intake. She did state that her shortness of breath is not worse.

## 2020-06-05 ENCOUNTER — Ambulatory Visit: Payer: Medicare PPO | Admitting: Endocrinology

## 2020-06-05 DIAGNOSIS — R0602 Shortness of breath: Secondary | ICD-10-CM | POA: Diagnosis not present

## 2020-06-05 DIAGNOSIS — I10 Essential (primary) hypertension: Secondary | ICD-10-CM | POA: Diagnosis not present

## 2020-06-06 LAB — BRAIN NATRIURETIC PEPTIDE: BNP: 38.7 pg/mL (ref 0.0–100.0)

## 2020-06-13 ENCOUNTER — Encounter: Payer: Self-pay | Admitting: Cardiovascular Disease

## 2020-06-13 ENCOUNTER — Ambulatory Visit (INDEPENDENT_AMBULATORY_CARE_PROVIDER_SITE_OTHER): Payer: Medicare PPO | Admitting: Cardiovascular Disease

## 2020-06-13 ENCOUNTER — Other Ambulatory Visit: Payer: Self-pay

## 2020-06-13 VITALS — BP 138/66 | HR 76 | Ht 63.0 in | Wt 236.0 lb

## 2020-06-13 DIAGNOSIS — G4733 Obstructive sleep apnea (adult) (pediatric): Secondary | ICD-10-CM | POA: Diagnosis not present

## 2020-06-13 DIAGNOSIS — R600 Localized edema: Secondary | ICD-10-CM | POA: Diagnosis not present

## 2020-06-13 DIAGNOSIS — I10 Essential (primary) hypertension: Secondary | ICD-10-CM | POA: Diagnosis not present

## 2020-06-13 DIAGNOSIS — Z8679 Personal history of other diseases of the circulatory system: Secondary | ICD-10-CM | POA: Diagnosis not present

## 2020-06-13 DIAGNOSIS — E782 Mixed hyperlipidemia: Secondary | ICD-10-CM

## 2020-06-13 MED ORDER — DILTIAZEM HCL ER COATED BEADS 240 MG PO CP24: 240.0000 mg | ORAL_CAPSULE | Freq: Every day | ORAL | 6 refills | Status: DC

## 2020-06-13 NOTE — Progress Notes (Signed)
Cardiology Office Note    Date:  06/17/2020   ID:  [name], DOB October 25, 1940, MRN [id number]  PCP:  Merrilee Seashore, MD  Cardiologist:   Sanda Klein, MD   No chief complaint on file.   History of Present Illness:  [name] is a 80 y.o. female with obesity, type 2 diabetes mellitus, previous history of TIA and possible ischemic colitis, hypertension. She has normal left ventricular systolic function and regional wall motion without valvular abnormalities.  She had a protracted course of acute pericarditis throughout the first half of 2019.  Cardiac MRI in April 2019 showed normal left ventricular systolic function with mild LVH and no evidence of late gadolinium enhancement.  Last echo on August 10, 2017 showed complete resolution of the pericardial effusion.  She had normal coronary arteries by catheterization in 2016.  Her biggest complaint continues to be lower extremity edema which is now making it difficult to wear shoes.  She is wearing compression stockings and following a low-sodium diet.  She goes for walks on a daily basis, in the afternoon when her blood pressure tends to be lower.  After 15 minutes of walking her "heart hurts" but then she can continue walking without worsening of her symptoms.  She denies orthopnea, PND, palpitations, dizziness, syncope, focal neurological events and claudication.  She has not had any falls or bleeding complications.  Her blood pressure tends to be a little variable, with a range of 135-162/76-91 heart rate in the 70s and 80s, oxygen saturation consistently normal  Past Medical History:  Diagnosis Date  . Bipolar disorder ([place])   . Depression    Bipolar  . GERD (gastroesophageal reflux disease)   . History of hiatal hernia   . History of kidney stones   . History of stomach ulcers 1970s   "bleeding"  . Hyperlipidemia   . Hypertension   . Hypothyroidism   . Ischemic colitis, enteritis, or enterocolitis ([place])   .  OSA on CPAP   . Pericarditis 05/06/2017  . Pneumonia    "several times" (06/23/2017)  . Thyroid disease   . TIA (transient ischemic attack) 2011 X 2  . Type II diabetes mellitus ([place])   . Urinary bladder incontinence   . Vertigo     Past Surgical History:  Procedure Laterality Date  . APPENDECTOMY    . BREAST CYST ASPIRATION Bilateral   . CARDIAC CATHETERIZATION N/A 12/24/2014   Procedure: Right/Left Heart Cath and Coronary Angiography;  Surgeon: Sanda Klein, MD;  Location: [facility];  Service: Cardiovascular;  Laterality: N/A;  . CATARACT EXTRACTION W/ INTRAOCULAR LENS  IMPLANT, BILATERAL Bilateral   . EXCISIONAL HEMORRHOIDECTOMY    . EYE SURGERY    . FRACTURE SURGERY    . IR THORACENTESIS ASP PLEURAL SPACE W/IMG GUIDE  05/13/2017  . LAPAROSCOPIC CHOLECYSTECTOMY    . LITHOTRIPSY  "several times"  . RETINAL DETACHMENT SURGERY     "think it was on the left; not sure" (06/23/2017)  . TONSILLECTOMY    . VAGINAL HYSTERECTOMY     partial     Current Medications: Outpatient Medications Prior to Visit  Medication Sig Dispense Refill  . Accu-Chek Softclix Lancets lancets Use 1-4 times daily as needed/instructed  DX E11.9 360 each 3  . aspirin EC 81 MG EC tablet Take 1 tablet (81 mg total) by mouth daily. 30 tablet 0  . betamethasone dipropionate (DIPROLENE) 0.05 % cream Apply 1 application topically 2 (two) times daily as needed (  rash).     . bisoprolol (ZEBETA) 10 MG tablet     . Blood Glucose Monitoring Suppl (ACCU-CHEK GUIDE) w/Device KIT Use 1-4 times daily as needed/directed  DX E11.9 1 kit 1  . carbamazepine (CARBATROL) 200 MG 12 hr capsule Take 200 mg by mouth 2 (two) times daily.     . clobetasol (TEMOVATE) 0.05 % GEL Apply 1 application topically 2 (two) times daily as needed (rash).     . dapagliflozin propanediol (FARXIGA) 10 MG TABS tablet Take 1 tablet (10 mg total) by mouth daily. Generic only 30 tablet 5  . donepezil (ARICEPT) 10 MG tablet Take 1 tablet by mouth  at bedtime.    Marland Kitchen esomeprazole (NEXIUM) 40 MG capsule Take 1 capsule (40 mg total) by mouth daily at 12 noon. 30 capsule 2  . folic acid (FOLVITE) 1 MG tablet Take 1 mg by mouth daily.    . furosemide (LASIX) 40 MG tablet Take 1 tablet (40 mg total) by mouth daily. 30 tablet 4  . gabapentin (NEURONTIN) 300 MG capsule Take 300 mg by mouth daily.    Marland Kitchen glucose blood (ACCU-CHEK GUIDE) test strip Use 1-4 times daily as needed/instructed DX E11.9 300 each 12  . insulin aspart (NOVOLOG FLEXPEN) 100 UNIT/ML FlexPen Inject 14-16 units before meals as directed 15 mL 1  . insulin aspart (NOVOLOG) 100 UNIT/ML injection Use max of 80 units in V-go per day 30 mL 3  . insulin degludec (TRESIBA FLEXTOUCH) 100 UNIT/ML FlexTouch Pen Inject 32 Units into the skin daily. Takes daily in the AM 15 mL 5  . Insulin Disposable Pump (V-GO 40) KIT USE 1 KIT DAILY AS DIRECTED 1 kit 5  . Insulin Pen Needle (BD PEN NEEDLE NANO U/F) 32G X 4 MM MISC USE 8 PEN NEEDLES PER DAY 200 each 0  . Insulin Pen Needle (COMFORT EZ PEN NEEDLES) 33G X 5 MM MISC 1 each by Does not apply route daily. Use with Tresiba pen 100 each 0  . Insulin Pen Needle 32G X 5 MM MISC 1 each by Does not apply route daily. Use with Tresiba pen 100 each 0  . isosorbide mononitrate (IMDUR) 30 MG 24 hr tablet Take 1 tablet (30 mg total) by mouth daily. 90 tablet 3  . levothyroxine (SYNTHROID, LEVOTHROID) 75 MCG tablet Take 75 mcg by mouth daily before breakfast.    . liraglutide (VICTOZA) 18 MG/3ML SOPN Inject 1.8 mg daily 9 mL 3  . methotrexate (RHEUMATREX) 2.5 MG tablet Take 7.5 mg by mouth every Monday. For rash    . metoprolol tartrate (LOPRESSOR) 25 MG tablet TAKE ONE TABLET BY MOUTH TWICE A DAY 180 tablet 2  . OLANZapine (ZYPREXA) 20 MG tablet Take 20 mg by mouth at bedtime. Patient take 1/2 tablet    . OLANZapine (ZYPREXA) 7.5 MG tablet Take 35 mg by mouth at bedtime.    . ondansetron (ZOFRAN) 8 MG tablet Take 1 tablet (8 mg total) by mouth every 8 (eight)  hours as needed for nausea. 9 tablet 0  . ONETOUCH DELICA LANCETS 01T MISC USE LANCET TO TEST FIVE TIMES A DAY 200 each 0  . potassium chloride SA (KLOR-CON) 10 MEQ tablet Take 1 tablet (10 mEq total) by mouth daily. 30 tablet 3  . pravastatin (PRAVACHOL) 40 MG tablet Take 1 tablet (40 mg total) by mouth daily. 30 tablet 0  . QUEtiapine (SEROQUEL) 25 MG tablet Take 4 tablets by mouth at bedtime.    Marland Kitchen VIMPAT 50  MG TABS tablet Take 50 mg by mouth 2 (two) times daily.    Marland Kitchen diltiazem (CARDIZEM CD) 360 MG 24 hr capsule Take 1 capsule (360 mg total) by mouth at bedtime. 30 capsule 0  . OLANZapine zydis (ZYPREXA) 10 MG disintegrating tablet Take 10 mg by mouth at bedtime. (Patient not taking: Reported on 06/13/2020)     No facility-administered medications prior to visit.     Allergies:   Flagyl [metronidazole hcl], Ciprofloxacin, Metformin and related, Milk-related compounds, and Other   Social History   Socioeconomic History  . Marital status: Widowed    Spouse name: Not on file  . Number of children: 2  . Years of education: 36  . Highest education level: Not on file  Occupational History  . Occupation: Retired  Tobacco Use  . Smoking status: Never Smoker  . Smokeless tobacco: Never Used  Vaping Use  . Vaping Use: Never used  Substance and Sexual Activity  . Alcohol use: Never  . Drug use: Never  . Sexual activity: Not on file  Other Topics Concern  . Not on file  Social History Narrative   Lives at home alone.   Right-handed.   No caffeine use.   Social Determinants of Health   Financial Resource Strain: Not on file  Food Insecurity: Not on file  Transportation Needs: Not on file  Physical Activity: Not on file  Stress: Not on file  Social Connections: Not on file     Family History:  The patient's family history includes Cancer in her brother and sister; Heart disease in her mother; Parkinson's disease in her father; Stroke in her father.   ROS:   Please see the  history of present illness.   All other systems are reviewed and are negative.   PHYSICAL EXAM:   VS:  BP 138/66 (BP Location: Left Arm, Patient Position: Sitting, Cuff Size: Large)   Pulse 76   Ht 5' 3"  (1.6 m)   Wt 236 lb (107 kg)   BMI 41.81 kg/m       General: Alert, oriented x3, no distress, morbidly obese Head: no evidence of trauma, PERRL, EOMI, no exophtalmos or lid lag, no myxedema, no xanthelasma; normal ears, nose and oropharynx Neck: normal jugular venous pulsations and no hepatojugular reflux; brisk carotid pulses without delay and no carotid bruits Chest: clear to auscultation, no signs of consolidation by percussion or palpation, normal fremitus, symmetrical and full respiratory excursions Cardiovascular: normal position and quality of the apical impulse, regular rhythm, normal first and second heart sounds, no murmurs, rubs or gallops Abdomen: no tenderness or distention, no masses by palpation, no abnormal pulsatility or arterial bruits, normal bowel sounds, no hepatosplenomegaly Extremities: no clubbing, cyanosis, but has 2-3+ pedal edema; 2+ radial, ulnar and brachial pulses bilaterally; 2+ right femoral, posterior tibial and dorsalis pedis pulses; 2+ left femoral, posterior tibial and dorsalis pedis pulses; no subclavian or femoral bruits Neurological: grossly nonfocal Psych: Normal mood and affect   Wt Readings from Last 3 Encounters:  06/13/20 236 lb (107 kg)  04/16/20 237 lb (107.5 kg)  04/03/20 237 lb 3.2 oz (107.6 kg)      Studies/Labs Reviewed:   ECHO 05/20/2020: 1. Left ventricular ejection fraction, by estimation, is 70 to 75%. The  left ventricle has hyperdynamic function. The left ventricle has no  regional wall motion abnormalities. There is moderate concentric left  ventricular hypertrophy. Left ventricular  diastolic parameters are consistent with Grade I diastolic dysfunction  (impaired relaxation).  2. Right ventricular systolic function is  normal. The right ventricular  size is normal. Tricuspid regurgitation signal is inadequate for assessing  PA pressure.  3. The mitral valve is degenerative. Trivial mitral valve regurgitation.  No evidence of mitral stenosis.  4. The aortic valve is tricuspid. There is mild calcification of the  aortic valve. Aortic valve regurgitation is not visualized. No aortic  stenosis is present.   Comparison(s): No significant change from prior study.   EKG:  EKG is not  ordered today.  The most recent tracing shows normal sinus rhythm and left axis deviation at about -30 degrees and poor R wave progression in the anterior leads, isolated T wave inversion in leads I and aVL (in the past she has had full-blown left anterior fascicular block)  Recent Labs: 10/02/2019: ALT 25; TSH 3.12 05/01/2020: BUN 18; Creatinine, Ser 0.90; Potassium 4.7; Sodium 140 06/05/2020: BNP 38.7   Lipid Panel    Component Value Date/Time   CHOL 149 03/26/2017 1710   TRIG 130 03/26/2017 1710   HDL 56 12/24/2019 0000   CHOLHDL 3.0 03/26/2017 1710   VLDL 26 03/26/2017 1710   LDLCALC 82 12/24/2019 0000    05/14/2020 Cholesterol 179, HDL 53, LDL 92, triglycerides 198 Hemoglobin A1c 7.5% Creatinine 1.04 Normal liver function tests 12/18/2019 TSH 2.010  ASSESSMENT:    1. Bilateral leg edema   2. History of pericarditis   3. Essential hypertension, benign   4. Mixed hyperlipidemia   5. OSA (obstructive sleep apnea)      PLAN:  In order of problems listed above:  1. Lower extremity edema : Probably mostly due to peripheral venous insufficiency, but diltiazem could be contributory.  Decrease the diltiazem to see if that helps.  In the past we have not been successful in controlling her blood pressure except when we used a calcium channel blocker. 2. History of acute pericarditis: No evidence of effusion or constriction on her follow-up echo. 3. HTN: Controlled.  Did not tolerate metoprolol well.  In the past,  she has had a lot of difficulty ever getting her blood pressure control except when using a calcium channel blocker. 4. HLP: All lipid parameters appear to be in target range, except for very mild hypertriglyceridemia.  Olanzapine may be contributing to her dyslipidemia. 5. DM: Good glycemic control on a combination of SGLT2 inhibitor and insulin 6. OSA:  Sees Dr. Elsworth Soho.  She reports compliance with CPAP and denies daytime hypersomnolence  Medication Adjustments/Labs and Tests Ordered: Current medicines are reviewed at length with the patient today.  Concerns regarding medicines are outlined above.  Medication changes, Labs and Tests ordered today are listed in the Patient Instructions below. Patient Instructions  Medication Instructions:  DECREASE the Diltiazem to 240 mg once daily  *If you need a refill on your cardiac medications before your next appointment, please call your pharmacy*   Lab Work: None ordered If you have labs (blood work) drawn today and your tests are completely normal, you will receive your results only by: Marland Kitchen MyChart Message (if you have MyChart) OR . A paper copy in the mail If you have any lab test that is abnormal or we need to change your treatment, we will call you to review the results.   Testing/Procedures: None ordered   Follow-Up: At [facility], you and your health needs are our priority.  As part of our continuing mission to provide you with exceptional heart care, we have created designated Provider Care Teams.  These Care Teams include your primary Cardiologist (physician) and Advanced Practice Providers (APPs -  Physician Assistants and Nurse Practitioners) who all work together to provide you with the care you need, when you need it.  We recommend signing up for the patient portal called "MyChart".  Sign up information is provided on this After Visit Summary.  MyChart is used to connect with patients for Virtual Visits (Telemedicine).  Patients are  able to view lab/test results, encounter notes, upcoming appointments, etc.  Non-urgent messages can be sent to your provider as well.   To learn more about what you can do with MyChart, go to [email or web address].    Your next appointment:   6 month(s)  The format for your next appointment:   In Person  Provider:   You may see Sanda Klein, MD or one of the following Advanced Practice Providers on your designated Care Team:    Almyra Deforest, PA-C  Fabian Sharp, [place] or   Roby Lofts, [place]  Dr. Sallyanne Kuster would like you to check your blood pressure daily for the next 2 weeks.  Keep a journal of these daily blood pressure and heart rate readings and call our office or send a message through [facility] with the results. Thank you!  It is best to check your BP 1-2 hours after taking your medications to see the medications effectiveness on your BP.    Here are some tips that our clinical pharmacists share for home BP monitoring:          Rest 10 minutes before taking your blood pressure.          Don't smoke or drink caffeinated beverages for at least 30 minutes before.          Take your blood pressure before (not after) you eat.          Sit comfortably with your back supported and both feet on the floor (don't cross your legs).          Elevate your arm to heart level on a table or a desk.          Use the proper sized cuff. It should fit smoothly and snugly around your bare upper arm. There should be enough room to slip a fingertip under the cuff. The bottom edge of the cuff should be 1 inch above the crease of the elbow.     Signed, Sanda Klein, MD  06/17/2020 5:28 PM    [facility] [place], [place],   [address] Phone: [phone]; Fax: [phone]

## 2020-06-13 NOTE — Patient Instructions (Addendum)
Medication Instructions:  DECREASE the Diltiazem to 240 mg once daily  *If you need a refill on your cardiac medications before your next appointment, please call your pharmacy*   Lab Work: None ordered If you have labs (blood work) drawn today and your tests are completely normal, you will receive your results only by: Marland Kitchen MyChart Message (if you have MyChart) OR . A paper copy in the mail If you have any lab test that is abnormal or we need to change your treatment, we will call you to review the results.   Testing/Procedures: None ordered   Follow-Up: At [facility], you and your health needs are our priority.  As part of our continuing mission to provide you with exceptional heart care, we have created designated Provider Care Teams.  These Care Teams include your primary Cardiologist (physician) and Advanced Practice Providers (APPs -  Physician Assistants and Nurse Practitioners) who all work together to provide you with the care you need, when you need it.  We recommend signing up for the patient portal called "MyChart".  Sign up information is provided on this After Visit Summary.  MyChart is used to connect with patients for Virtual Visits (Telemedicine).  Patients are able to view lab/test results, encounter notes, upcoming appointments, etc.  Non-urgent messages can be sent to your provider as well.   To learn more about what you can do with MyChart, go to [email or web address].    Your next appointment:   6 month(s)  The format for your next appointment:   In Person  Provider:   You may see Thurmon Fair, MD or one of the following Advanced Practice Providers on your designated Care Team:    Azalee Course, PA-C  Micah Flesher, [place] or   Judy Pimple, [place]  Dr. Royann Shivers would like you to check your blood pressure daily for the next 2 weeks.  Keep a journal of these daily blood pressure and heart rate readings and call our office or send a message through MyChart with  the results. Thank you!  It is best to check your BP 1-2 hours after taking your medications to see the medications effectiveness on your BP.    Here are some tips that our clinical pharmacists share for home BP monitoring:          Rest 10 minutes before taking your blood pressure.          Don't smoke or drink caffeinated beverages for at least 30 minutes before.          Take your blood pressure before (not after) you eat.          Sit comfortably with your back supported and both feet on the floor (don't cross your legs).          Elevate your arm to heart level on a table or a desk.          Use the proper sized cuff. It should fit smoothly and snugly around your bare upper arm. There should be enough room to slip a fingertip under the cuff. The bottom edge of the cuff should be 1 inch above the crease of the elbow.

## 2020-06-23 ENCOUNTER — Telehealth: Payer: Self-pay | Admitting: Cardiovascular Disease

## 2020-06-23 MED ORDER — POTASSIUM CHLORIDE CRYS ER 10 MEQ PO TBCR: 10.0000 meq | EXTENDED_RELEASE_TABLET | Freq: Every day | ORAL | 3 refills | Status: DC

## 2020-06-23 NOTE — Telephone Encounter (Signed)
*  STAT* If patient is at the pharmacy, call can be transferred to refill team.   1. Which medications need to be refilled? (please list name of each medication and dose if known) new prescription for Potassium Chloride  2. Which pharmacy/location (including street and city if local pharmacy) is medication to be sent to? [facility] [address], [place]  3.. Do they need a 30 day or 90 day supply? 90 days and refills

## 2020-07-01 NOTE — Telephone Encounter (Signed)
BP reviewed - it is up and down - DR. C reduced her diltiazem, which she takes at night- I suspect it is wearing off as her PM readings are higher . Would continue with this therapy and discuss with Dr. Salena Saner when he returns.  Dr Rennis Golden (covering for Dr. Salena Saner for the next 2 weeks).

## 2020-07-04 ENCOUNTER — Other Ambulatory Visit (INDEPENDENT_AMBULATORY_CARE_PROVIDER_SITE_OTHER): Payer: Medicare PPO

## 2020-07-04 ENCOUNTER — Other Ambulatory Visit: Payer: Self-pay

## 2020-07-04 DIAGNOSIS — E063 Autoimmune thyroiditis: Secondary | ICD-10-CM | POA: Diagnosis not present

## 2020-07-04 DIAGNOSIS — Z794 Long term (current) use of insulin: Secondary | ICD-10-CM | POA: Diagnosis not present

## 2020-07-04 DIAGNOSIS — E1165 Type 2 diabetes mellitus with hyperglycemia: Secondary | ICD-10-CM | POA: Diagnosis not present

## 2020-07-04 LAB — COMPREHENSIVE METABOLIC PANEL
ALT: 29 U/L (ref 0–35)
AST: 21 U/L (ref 0–37)
Albumin: 4.2 g/dL (ref 3.5–5.2)
Alkaline Phosphatase: 110 U/L (ref 39–117)
BUN: 18 mg/dL (ref 6–23)
CO2: 27 mEq/L (ref 19–32)
Calcium: 9.7 mg/dL (ref 8.4–10.5)
Chloride: 102 mEq/L (ref 96–112)
Creatinine, Ser: 1.01 mg/dL (ref 0.40–1.20)
GFR: 53.07 mL/min — ABNORMAL LOW (ref >60.00)
Glucose, Bld: 104 mg/dL — ABNORMAL HIGH (ref 70–99)
Potassium: 4.5 mEq/L (ref 3.5–5.1)
Sodium: 140 mEq/L (ref 135–145)
Total Bilirubin: 0.3 mg/dL (ref 0.2–1.2)
Total Protein: 7.2 g/dL (ref 6.0–8.3)

## 2020-07-04 LAB — TSH: TSH: 1.71 u[IU]/mL (ref 0.35–4.50)

## 2020-07-04 LAB — HEMOGLOBIN A1C: Hgb A1c MFr Bld: 7.8 % — ABNORMAL HIGH (ref 4.6–6.5)

## 2020-07-10 ENCOUNTER — Other Ambulatory Visit: Payer: Self-pay

## 2020-07-10 ENCOUNTER — Ambulatory Visit (INDEPENDENT_AMBULATORY_CARE_PROVIDER_SITE_OTHER): Payer: Medicare PPO | Admitting: Endocrinology

## 2020-07-10 VITALS — BP 130/82 | HR 81 | Ht 63.0 in | Wt 239.4 lb

## 2020-07-10 DIAGNOSIS — Z794 Long term (current) use of insulin: Secondary | ICD-10-CM | POA: Diagnosis not present

## 2020-07-10 DIAGNOSIS — E1165 Type 2 diabetes mellitus with hyperglycemia: Secondary | ICD-10-CM

## 2020-07-10 DIAGNOSIS — I10 Essential (primary) hypertension: Secondary | ICD-10-CM | POA: Diagnosis not present

## 2020-07-10 NOTE — Patient Instructions (Signed)
Check blood sugars on waking up 5-6 days a week ° °Also check blood sugars about 2 hours after meals and do this after different meals by rotation ° °Recommended blood sugar levels on waking up are 90-130 and about 2 hours after meal is 130-160 ° °Please bring your blood sugar monitor to each visit, thank you ° °

## 2020-07-10 NOTE — Progress Notes (Signed)
Patient ID: [name], female   DOB: Oct 01, 1940, 80 y.o.   MRN: [id number]    Reason for Appointment: Followup for Type 2 Diabetes    History of Present Illness:          Diagnosis: Type 2 diabetes mellitus, date of diagnosis:2011       Past history: Since she was apparently intolerant to metformin she was treated with Onglyza until about 2014  At that time because of increasing A1c of about 8% she was started on Levemir 15 units and this was aggressively increased Onglyza was continued She has not tried any other treatments for diabetes Her A1c has been 10-10.5 in 2015 In 3/15 she was told to start small doses of NovoLog at lunch and supper and add 15 units of Levemir at night also To control  hyperglycemia with her basal bolus insulin regimen she was given Victoza in addition on her initial consultation in 5/15 She has been on Victoza since 8/15  She has been on the V-go pump since 09/2014  Recent history:   INSULIN regimen :  V-go pump 40 unit basal, boluses at meal times: 10-28-10 units TRESIBA 32 units daily   Noninsulin hypoglycemic drugs the patient is taking are: Farxiga 10 mg, daily VICTOZA 1.8 mg  Her A1c is 7.8  Current management, blood sugar patterns and problems: Overall blood sugars appear to be higher compared to her last visit also previously not much information was available about her home blood sugars Even though she has not had any steroids or intercurrent illness she has had blood sugars as high as 439 She thinks she gets more hungry late at night although usually trying to eat either peanut butter crackers or fruit but also may snack on peanuts Again checking blood sugars mostly in the mornings and rarely in the evenings She has significant variability in her morning sugars Tyler Aas was increased on the last visit and she takes this regularly Lab fasting glucose was 104 Periodically will run out of boluses on her pump but not clear if she  is taking NovoLog injections consistently for supplemental coverage especially since she does not check readings before lunch or dinner regularly Previously highest blood sugars were after dinner but now morning readings are significantly high Has a couple of fasting readings which are high and also her lab fasting glucose was 161 No hypoglycemia with lowest blood sugar only 122 which she says that about once a week or so she may feel a little hypoglycemic in the afternoon She changes her pump at the same time daily at 2-3 pm and not forgetting to do this She is not able to do much walking because of shortness of breath    Meals: 3 meals per day.  Breakfast: Oatmeal or egg or sausage at 8-9 am sometimes with sausage for protein, dinner 5 pm.;   Night snacks will be fruit or peanut butter crackers    Side effects from medications have been: Metformin: rash  Glucose monitoring:  done 1 time a day at least, mostly in the morning       Glucometer: Accu-Chek    Blood Glucose readings from review of meter:   PRE-MEAL Mornings Lunch Dinner Bedtime Overall  Glucose range: 159-439  142 140 22-439  Mean/median: 220 200   196   POST-MEAL PC Breakfast PC Lunch PC Dinner  Glucose range:   189  Mean/median:      Previously   PRE-MEAL Fasting [facility]  Bedtime Overall  Glucose range:  159, 180  106     Mean/median:     173   POST-MEAL PC Breakfast PC Lunch PC Dinner  Glucose range:   175-211  165-279  Mean/median:        Glycemic control:    Lab Results  Component Value Date   HGBA1C 7.8 (H) 07/04/2020   HGBA1C 7.5 (H) 03/28/2020   HGBA1C 6.8 12/24/2019   Lab Results  Component Value Date   MICROALBUR <0.7 10/02/2019   LDLCALC 82 12/24/2019   CREATININE 1.01 07/04/2020    Self-care: The diet that the patient has been following is: tries to limit fats            Dietician visit: Most recent: 2/19   CDE visit: 9/16    Weight history:  Wt Readings from Last 3  Encounters:  07/10/20 239 lb 6.4 oz (108.6 kg)  06/13/20 236 lb (107 kg)  04/16/20 237 lb (107.5 kg)    Other active problems: See review of systems    Allergies as of 07/10/2020       Reactions   Flagyl [metronidazole Hcl] Itching, Rash   Ciprofloxacin Itching, Rash   Metformin And Related Rash   Milk-related Compounds Other (See Comments)   Stomach pains   Other Other (See Comments)   [place] nuts cause severe facial redness        Medication List        Accurate as of July 10, 2020  3:43 PM. If you have any questions, ask your nurse or doctor.          Accu-Chek Guide test strip Generic drug: glucose blood Use 1-4 times daily as needed/instructed DX E11.9   Accu-Chek Guide w/Device Kit Use 1-4 times daily as needed/directed  DX E11.9   aspirin 81 MG EC tablet Take 1 tablet (81 mg total) by mouth daily.   betamethasone dipropionate 0.05 % cream Apply 1 application topically 2 (two) times daily as needed (rash).   bisoprolol 10 MG tablet Commonly known as: ZEBETA   carbamazepine 200 MG 12 hr capsule Commonly known as: CARBATROL Take 200 mg by mouth 2 (two) times daily.   clobetasol 0.05 % Gel Commonly known as: TEMOVATE Apply 1 application topically 2 (two) times daily as needed (rash).   dapagliflozin propanediol 10 MG Tabs tablet Commonly known as: FARXIGA Take 1 tablet (10 mg total) by mouth daily. Generic only   diltiazem 240 MG 24 hr capsule Commonly known as: CARDIZEM CD Take 1 capsule (240 mg total) by mouth at bedtime.   donepezil 10 MG tablet Commonly known as: ARICEPT Take 1 tablet by mouth at bedtime.   esomeprazole 40 MG capsule Commonly known as: NexIUM Take 1 capsule (40 mg total) by mouth daily at 12 noon.   folic acid 1 MG tablet Commonly known as: FOLVITE Take 1 mg by mouth daily.   furosemide 40 MG tablet Commonly known as: LASIX Take 1 tablet (40 mg total) by mouth daily.   gabapentin 300 MG capsule Commonly known  as: NEURONTIN Take 300 mg by mouth daily.   Insulin Pen Needle 33G X 5 MM Misc Commonly known as: Comfort EZ Pen Needles 1 each by Does not apply route daily. Use with Tresiba pen   Insulin Pen Needle 32G X 5 MM Misc 1 each by Does not apply route daily. Use with Tresiba pen   BD Pen Needle Nano U/F 32G X 4 MM Misc Generic drug: Insulin  Pen Needle USE 8 PEN NEEDLES PER DAY   isosorbide mononitrate 30 MG 24 hr tablet Commonly known as: IMDUR Take 1 tablet (30 mg total) by mouth daily.   levothyroxine 75 MCG tablet Commonly known as: SYNTHROID Take 75 mcg by mouth daily before breakfast.   methotrexate 2.5 MG tablet Commonly known as: RHEUMATREX Take 7.5 mg by mouth every Monday. For rash   metoprolol tartrate 25 MG tablet Commonly known as: LOPRESSOR TAKE ONE TABLET BY MOUTH TWICE A DAY   NovoLOG FlexPen 100 UNIT/ML FlexPen Generic drug: insulin aspart Inject 14-16 units before meals as directed   insulin aspart 100 UNIT/ML injection Commonly known as: novoLOG Use max of 80 units in V-go per day   OLANZapine 7.5 MG tablet Commonly known as: ZYPREXA Take 35 mg by mouth at bedtime.   OLANZapine 20 MG tablet Commonly known as: ZYPREXA Take 20 mg by mouth at bedtime. Patient take 1/2 tablet   OLANZapine zydis 10 MG disintegrating tablet Commonly known as: ZYPREXA Take 10 mg by mouth at bedtime.   ondansetron 8 MG tablet Commonly known as: Zofran Take 1 tablet (8 mg total) by mouth every 8 (eight) hours as needed for nausea.   OneTouch Delica Lancets 27C Misc USE LANCET TO TEST FIVE TIMES A DAY   Accu-Chek Softclix Lancets lancets Use 1-4 times daily as needed/instructed  DX E11.9   potassium chloride 10 MEQ tablet Commonly known as: KLOR-CON Take 1 tablet (10 mEq total) by mouth daily.   pravastatin 40 MG tablet Commonly known as: PRAVACHOL Take 1 tablet (40 mg total) by mouth daily.   QUEtiapine 25 MG tablet Commonly known as: SEROQUEL Take 4 tablets  by mouth at bedtime.   Tyler Aas FlexTouch 100 UNIT/ML FlexTouch Pen Generic drug: insulin degludec Inject 32 Units into the skin daily. Takes daily in the AM   V-Go 40 Kit USE 1 KIT DAILY AS DIRECTED   Victoza 18 MG/3ML Sopn Generic drug: liraglutide Inject 1.8 mg daily   Vimpat 50 MG Tabs tablet Generic drug: lacosamide Take 50 mg by mouth 2 (two) times daily.        Allergies:  Allergies  Allergen Reactions   Flagyl [Metronidazole Hcl] Itching and Rash   Ciprofloxacin Itching and Rash   Metformin And Related Rash   Milk-Related Compounds Other (See Comments)    Stomach pains   Other Other (See Comments)    [place] nuts cause severe facial redness    Past Medical History:  Diagnosis Date   Bipolar disorder ([place])    Depression    Bipolar   GERD (gastroesophageal reflux disease)    History of hiatal hernia    History of kidney stones    History of stomach ulcers 1970s   "bleeding"   Hyperlipidemia    Hypertension    Hypothyroidism    Ischemic colitis, enteritis, or enterocolitis ([place])    OSA on CPAP    Pericarditis 05/06/2017   Pneumonia    "several times" (06/23/2017)   Thyroid disease    TIA (transient ischemic attack) 2011 X 2   Type II diabetes mellitus ([place])    Urinary bladder incontinence    Vertigo     Past Surgical History:  Procedure Laterality Date   APPENDECTOMY     BREAST CYST ASPIRATION Bilateral    CARDIAC CATHETERIZATION N/A 12/24/2014   Procedure: Right/Left Heart Cath and Coronary Angiography;  Surgeon: Sanda Klein, MD;  Location: [facility];  Service: Cardiovascular;  Laterality: N/A;  CATARACT EXTRACTION W/ INTRAOCULAR LENS  IMPLANT, BILATERAL Bilateral    EXCISIONAL HEMORRHOIDECTOMY     EYE SURGERY     FRACTURE SURGERY     IR THORACENTESIS ASP PLEURAL SPACE W/IMG GUIDE  05/13/2017   LAPAROSCOPIC CHOLECYSTECTOMY     LITHOTRIPSY  "several times"   RETINAL DETACHMENT SURGERY     "think it was on the left; not sure"  (06/23/2017)   TONSILLECTOMY     VAGINAL HYSTERECTOMY     partial     Family History  Problem Relation Age of Onset   Heart disease Mother    Stroke Father    Parkinson's disease Father    Cancer Sister    Cancer Brother     Social History:  reports that she has never smoked. She has never used smokeless tobacco. She reports that she does not drink alcohol and does not use drugs.    Review of Systems    NEUROPATHY:  Taking gabapentin for symptom relief      Lipids: Last LDL from PCP was 92 in 4/22, followed by PCP       Lab Results  Component Value Date   CHOL 149 03/26/2017   HDL 56 12/24/2019   LDLCALC 82 12/24/2019   TRIG 130 03/26/2017   CHOLHDL 3.0 03/26/2017       Thyroid:   Has had presumed hypothyroidism for over 20 years, has not had a change in her 75 mcg levothyroxine dose in several years TSH history:   Lab Results  Component Value Date   TSH 1.71 07/04/2020   TSH 3.12 10/02/2019   TSH 3.59 08/16/2018   FREET4 1.04 05/05/2017   FREET4 0.86 01/31/2014   FREET4 0.91 08/28/2013       The blood pressure has been managed by PCP with a combination of diltiazem  and bisoprolol     BP Readings from Last 3 Encounters:  07/10/20 130/82  06/13/20 138/66  04/16/20 (!) 150/70     Physical Examination:  BP 130/82   Pulse 81   Ht 5' 3"  (1.6 m)   Wt 239 lb 6.4 oz (108.6 kg)   SpO2 92%   BMI 42.41 kg/m      ASSESSMENT/PLAN:   Diabetes type 2, with obesity  See history of present illness for detailed discussion of his current management, blood sugar patterns and problems identified  A1c is 7.8 and higher  She is on a V-go pump using NovoLog along with additional Tresiba basal insulin 32 units daily and also [place] and Victoza  Problems with her control related to inconsistent diet, increasing insulin resistance partly related to atypical antipsychotics, difficulty with exercise, inconsistent diet including likely snacking at night Blood  sugar monitoring is inadequate and less than once a day with most readings in the mornings  She wants to keep her regimen simple with only the V-go pump even though she is requiring about 70 units of basal insulin now, she is still not wanting to switch to the OmniPod because of difficulty with technology  Continues on Farxiga and Victoza regularly but not clear how much she is benefiting from  Recommendations: She will try to get the freestyle libre, prescription faxed to [facility] She will be trained by the diabetes educator In the meantime needs to start increasing her blood sugar monitoring especially after meals For now we will keep her Tyler Aas the same She also needs to take supplemental mealtime insulin if she is running out of bolus clicks on  her pump To try and avoid eating high-fat meals and snacks such as peanuts   Hypothyroidism: TSH normal and she will stay on 75 mcg levothyroxine  Hypertension: Well-controlled   There are no Patient Instructions on file for this visit.  Total visit time including counseling = 30 minutes   Elayne Snare 07/10/2020, 3:43 PM   Note: This office note was prepared with Dragon voice recognition system technology. Any transcriptional errors that result from this process are unintentional.

## 2020-07-16 ENCOUNTER — Ambulatory Visit: Payer: Medicare PPO | Admitting: Nutrition

## 2020-07-16 ENCOUNTER — Other Ambulatory Visit: Payer: Self-pay

## 2020-07-16 DIAGNOSIS — Z794 Long term (current) use of insulin: Secondary | ICD-10-CM

## 2020-07-16 DIAGNOSIS — E119 Type 2 diabetes mellitus without complications: Secondary | ICD-10-CM

## 2020-07-17 NOTE — Patient Instructions (Signed)
After 2 weeks, please remove the sensor and put in a plastic bag.  Return it to the office for download

## 2020-07-17 NOTE — Progress Notes (Signed)
Libre pro sample was inserted into patient's left upper outer arm with instructions to keep the sensor in place for 2 weeks After 2 weeks she was told to remove the sensor and return it to Dr. Remus Blake office for download

## 2020-07-25 DIAGNOSIS — E1165 Type 2 diabetes mellitus with hyperglycemia: Secondary | ICD-10-CM | POA: Diagnosis not present

## 2020-07-25 DIAGNOSIS — I10 Essential (primary) hypertension: Secondary | ICD-10-CM | POA: Diagnosis not present

## 2020-07-25 DIAGNOSIS — E782 Mixed hyperlipidemia: Secondary | ICD-10-CM | POA: Diagnosis not present

## 2020-07-30 ENCOUNTER — Telehealth: Payer: Self-pay | Admitting: Nutrition

## 2020-07-30 NOTE — Telephone Encounter (Signed)
Libre sensor was removed from patient's right arm.  No redness or swelling noted.  Download printed and put on Dr. Remus Blake desk

## 2020-07-31 NOTE — Telephone Encounter (Signed)
Her blood sugars are reading somewhat low on the [place] sensor.  Please ask the patient to give the blood sugar readings from her meter for July 11 and July 12 to correlate

## 2020-08-01 ENCOUNTER — Other Ambulatory Visit: Payer: Self-pay | Admitting: Endocrinology

## 2020-08-01 DIAGNOSIS — N1831 Chronic kidney disease, stage 3a: Secondary | ICD-10-CM | POA: Diagnosis not present

## 2020-08-01 DIAGNOSIS — E782 Mixed hyperlipidemia: Secondary | ICD-10-CM | POA: Diagnosis not present

## 2020-08-01 DIAGNOSIS — I10 Essential (primary) hypertension: Secondary | ICD-10-CM | POA: Diagnosis not present

## 2020-08-01 DIAGNOSIS — E1165 Type 2 diabetes mellitus with hyperglycemia: Secondary | ICD-10-CM | POA: Diagnosis not present

## 2020-08-01 NOTE — Telephone Encounter (Signed)
Called patient no answer and no voicemail machine to leave message.

## 2020-08-05 ENCOUNTER — Other Ambulatory Visit: Payer: Self-pay | Admitting: Cardiovascular Disease

## 2020-08-07 ENCOUNTER — Encounter (INDEPENDENT_AMBULATORY_CARE_PROVIDER_SITE_OTHER): Payer: Medicare PPO | Admitting: Endocrinology

## 2020-08-07 DIAGNOSIS — Z794 Long term (current) use of insulin: Secondary | ICD-10-CM | POA: Diagnosis not present

## 2020-08-07 DIAGNOSIS — E1165 Type 2 diabetes mellitus with hyperglycemia: Secondary | ICD-10-CM | POA: Diagnosis not present

## 2020-08-07 NOTE — Progress Notes (Signed)
[name] Jun 13, 1940  Notes regarding CGM professional version initiation on 07/16/2020 noted and approved  Patient did not keep a record of her blood sugars and unable to reach the patient on telephone until today  Data reviewed from the freestyle libre sensor download between June 29 and July 13 Interpretation is as follows   CONTINUOUS GLUCOSE MONITORING RECORD INTERPRETATION    Dates of Recording: 6/29 through 7/13  Sensor description: Freestyle libre pro  Results statistics:   CGM use % of time 100  Average and SD 121/33  Time in range       82%  % Time Above 180 8  % Time above 250 0  % Time Below target 10    PRE-MEAL Fasting Lunch Dinner 2-4 AM Overall  Glucose range:       Mean/median: 137 129 112 104    POST-MEAL PC Breakfast PC Lunch PC Dinner  Glucose range:     Mean/median: 128 128 103    Glycemic patterns summary: Lowest readings are between 2 AM and 4 AM and 8 PM-10 PM with highest readings 6 AM-8 AM Blood sugars are inconsistent with hypoglycemia starting to be noted between 7/9 and 7/12 mostly Otherwise blood sugars are frequently in the desirable range with average blood sugar in the target range throughout the day and night  Hyperglycemic episodes occurred sporadically after lunch and breakfast and also after rebound from low blood sugars  Hypoglycemic episodes occurred overnight on 7/10 and 7/12 as well as transiently midday on 11th and 12th.  Also had low sugars at night after 9 PM on 7/8, 7/9, 7/11 and 12  Overnight periods: Blood sugars are variable with relatively low readings until 3 AM and then gradually rising overall with occasional mild hyperglycemia at around 5-6 AM  Preprandial periods: Blood sugars are mildly increased at breakfast and lunch and relatively lower at dinnertime, near normal before dinner  Postprandial periods:   After breakfast: Blood sugars may go up over 200 early morning but not consistently After lunch:   Blood  sugars are usually very well controlled except on about 2 occasions After dinner: Blood sugars are usually flat to lower with tendency to low normal or low sugars starting on July 9  [employer] Lucianne Muss

## 2020-08-07 NOTE — Telephone Encounter (Signed)
Reviewed results of CGM could not get results of her blood sugars from the days he was having low sugars.  She only reports low normal readings around 11 PM and morning readings around 130-140.  Today recommended that instead of taking 14 to 16 units of insulin with injection she should go only 5-6 clicks with her pump at suppertime and less if she is still has low sugars. She will also need to check to see if she can get supplies for the freestyle CGM on her own. Please call [facility] to see they have received my fax order

## 2020-08-11 ENCOUNTER — Encounter: Payer: Self-pay | Admitting: *Deleted

## 2020-08-20 ENCOUNTER — Other Ambulatory Visit: Payer: Self-pay | Admitting: *Deleted

## 2020-08-20 ENCOUNTER — Other Ambulatory Visit: Payer: Self-pay

## 2020-08-20 ENCOUNTER — Emergency Department (HOSPITAL_COMMUNITY): Payer: Medicare PPO

## 2020-08-20 ENCOUNTER — Inpatient Hospital Stay (HOSPITAL_COMMUNITY)
Admission: EM | Admit: 2020-08-20 | Discharge: 2020-08-26 | DRG: 871 | Disposition: A | Discharge status: Home Health | Payer: Medicare PPO | Attending: Internal Medicine | Admitting: Internal Medicine

## 2020-08-20 ENCOUNTER — Encounter (HOSPITAL_COMMUNITY): Payer: Self-pay | Admitting: Emergency Medicine

## 2020-08-20 ENCOUNTER — Encounter: Payer: Self-pay | Admitting: *Deleted

## 2020-08-20 DIAGNOSIS — A419 Sepsis, unspecified organism: Secondary | ICD-10-CM | POA: Diagnosis present

## 2020-08-20 DIAGNOSIS — G9341 Metabolic encephalopathy: Secondary | ICD-10-CM | POA: Diagnosis present

## 2020-08-20 DIAGNOSIS — Z91018 Allergy to other foods: Secondary | ICD-10-CM

## 2020-08-20 DIAGNOSIS — R748 Abnormal levels of other serum enzymes: Secondary | ICD-10-CM

## 2020-08-20 DIAGNOSIS — R112 Nausea with vomiting, unspecified: Secondary | ICD-10-CM

## 2020-08-20 DIAGNOSIS — E876 Hypokalemia: Secondary | ICD-10-CM | POA: Diagnosis not present

## 2020-08-20 DIAGNOSIS — Z794 Long term (current) use of insulin: Secondary | ICD-10-CM

## 2020-08-20 DIAGNOSIS — Z9842 Cataract extraction status, left eye: Secondary | ICD-10-CM

## 2020-08-20 DIAGNOSIS — R410 Disorientation, unspecified: Secondary | ICD-10-CM | POA: Diagnosis present

## 2020-08-20 DIAGNOSIS — I248 Other forms of acute ischemic heart disease: Secondary | ICD-10-CM | POA: Diagnosis present

## 2020-08-20 DIAGNOSIS — Z8711 Personal history of peptic ulcer disease: Secondary | ICD-10-CM

## 2020-08-20 DIAGNOSIS — R109 Unspecified abdominal pain: Secondary | ICD-10-CM

## 2020-08-20 DIAGNOSIS — K219 Gastro-esophageal reflux disease without esophagitis: Secondary | ICD-10-CM | POA: Diagnosis present

## 2020-08-20 DIAGNOSIS — I5033 Acute on chronic diastolic (congestive) heart failure: Secondary | ICD-10-CM | POA: Diagnosis present

## 2020-08-20 DIAGNOSIS — F319 Bipolar disorder, unspecified: Secondary | ICD-10-CM | POA: Diagnosis present

## 2020-08-20 DIAGNOSIS — R4182 Altered mental status, unspecified: Secondary | ICD-10-CM | POA: Diagnosis not present

## 2020-08-20 DIAGNOSIS — J189 Pneumonia, unspecified organism: Secondary | ICD-10-CM | POA: Diagnosis present

## 2020-08-20 DIAGNOSIS — Z20822 Contact with and (suspected) exposure to covid-19: Secondary | ICD-10-CM | POA: Diagnosis present

## 2020-08-20 DIAGNOSIS — Z961 Presence of intraocular lens: Secondary | ICD-10-CM | POA: Diagnosis present

## 2020-08-20 DIAGNOSIS — H579 Unspecified disorder of eye and adnexa: Secondary | ICD-10-CM | POA: Diagnosis not present

## 2020-08-20 DIAGNOSIS — Z888 Allergy status to other drugs, medicaments and biological substances status: Secondary | ICD-10-CM

## 2020-08-20 DIAGNOSIS — E872 Acidosis: Secondary | ICD-10-CM | POA: Diagnosis present

## 2020-08-20 DIAGNOSIS — Z743 Need for continuous supervision: Secondary | ICD-10-CM | POA: Diagnosis not present

## 2020-08-20 DIAGNOSIS — Z9841 Cataract extraction status, right eye: Secondary | ICD-10-CM

## 2020-08-20 DIAGNOSIS — K449 Diaphragmatic hernia without obstruction or gangrene: Secondary | ICD-10-CM | POA: Diagnosis not present

## 2020-08-20 DIAGNOSIS — I110 Hypertensive heart disease with heart failure: Secondary | ICD-10-CM | POA: Diagnosis present

## 2020-08-20 DIAGNOSIS — G4733 Obstructive sleep apnea (adult) (pediatric): Secondary | ICD-10-CM | POA: Diagnosis present

## 2020-08-20 DIAGNOSIS — K760 Fatty (change of) liver, not elsewhere classified: Secondary | ICD-10-CM | POA: Diagnosis present

## 2020-08-20 DIAGNOSIS — Z66 Do not resuscitate: Secondary | ICD-10-CM | POA: Diagnosis present

## 2020-08-20 DIAGNOSIS — R350 Frequency of micturition: Secondary | ICD-10-CM | POA: Diagnosis present

## 2020-08-20 DIAGNOSIS — E785 Hyperlipidemia, unspecified: Secondary | ICD-10-CM | POA: Diagnosis present

## 2020-08-20 DIAGNOSIS — Z8673 Personal history of transient ischemic attack (TIA), and cerebral infarction without residual deficits: Secondary | ICD-10-CM

## 2020-08-20 DIAGNOSIS — R531 Weakness: Secondary | ICD-10-CM

## 2020-08-20 DIAGNOSIS — Z90711 Acquired absence of uterus with remaining cervical stump: Secondary | ICD-10-CM

## 2020-08-20 DIAGNOSIS — Z7982 Long term (current) use of aspirin: Secondary | ICD-10-CM

## 2020-08-20 DIAGNOSIS — R0602 Shortness of breath: Secondary | ICD-10-CM | POA: Diagnosis not present

## 2020-08-20 DIAGNOSIS — R7989 Other specified abnormal findings of blood chemistry: Secondary | ICD-10-CM

## 2020-08-20 DIAGNOSIS — J9811 Atelectasis: Secondary | ICD-10-CM | POA: Diagnosis not present

## 2020-08-20 DIAGNOSIS — Z87442 Personal history of urinary calculi: Secondary | ICD-10-CM | POA: Diagnosis not present

## 2020-08-20 DIAGNOSIS — Z79890 Hormone replacement therapy (postmenopausal): Secondary | ICD-10-CM

## 2020-08-20 DIAGNOSIS — E871 Hypo-osmolality and hyponatremia: Secondary | ICD-10-CM | POA: Diagnosis present

## 2020-08-20 DIAGNOSIS — I10 Essential (primary) hypertension: Secondary | ICD-10-CM | POA: Diagnosis not present

## 2020-08-20 DIAGNOSIS — R110 Nausea: Secondary | ICD-10-CM | POA: Diagnosis not present

## 2020-08-20 DIAGNOSIS — E039 Hypothyroidism, unspecified: Secondary | ICD-10-CM | POA: Diagnosis present

## 2020-08-20 DIAGNOSIS — E119 Type 2 diabetes mellitus without complications: Secondary | ICD-10-CM | POA: Diagnosis present

## 2020-08-20 DIAGNOSIS — Z6841 Body Mass Index (BMI) 40.0 and over, adult: Secondary | ICD-10-CM | POA: Diagnosis not present

## 2020-08-20 DIAGNOSIS — Z883 Allergy status to other anti-infective agents status: Secondary | ICD-10-CM

## 2020-08-20 DIAGNOSIS — K5730 Diverticulosis of large intestine without perforation or abscess without bleeding: Secondary | ICD-10-CM | POA: Diagnosis present

## 2020-08-20 DIAGNOSIS — I700 Atherosclerosis of aorta: Secondary | ICD-10-CM | POA: Diagnosis not present

## 2020-08-20 DIAGNOSIS — Z9049 Acquired absence of other specified parts of digestive tract: Secondary | ICD-10-CM

## 2020-08-20 DIAGNOSIS — Z8249 Family history of ischemic heart disease and other diseases of the circulatory system: Secondary | ICD-10-CM

## 2020-08-20 DIAGNOSIS — K5900 Constipation, unspecified: Secondary | ICD-10-CM | POA: Diagnosis present

## 2020-08-20 DIAGNOSIS — R0600 Dyspnea, unspecified: Secondary | ICD-10-CM | POA: Diagnosis not present

## 2020-08-20 DIAGNOSIS — Z91011 Allergy to milk products: Secondary | ICD-10-CM

## 2020-08-20 DIAGNOSIS — Z881 Allergy status to other antibiotic agents status: Secondary | ICD-10-CM

## 2020-08-20 DIAGNOSIS — R778 Other specified abnormalities of plasma proteins: Secondary | ICD-10-CM

## 2020-08-20 LAB — COMPREHENSIVE METABOLIC PANEL
ALT: 31 U/L (ref 0–44)
AST: 22 U/L (ref 15–41)
Albumin: 3.4 g/dL — ABNORMAL LOW (ref 3.5–5.0)
Alkaline Phosphatase: 76 U/L (ref 38–126)
Anion gap: 10 (ref 5–15)
BUN: 20 mg/dL (ref 8–23)
CO2: 24 mmol/L (ref 22–32)
Calcium: 8.3 mg/dL — ABNORMAL LOW (ref 8.9–10.3)
Chloride: 96 mmol/L — ABNORMAL LOW (ref 98–111)
Creatinine, Ser: 1.1 mg/dL — ABNORMAL HIGH (ref 0.44–1.00)
GFR, Estimated: 51 mL/min — ABNORMAL LOW (ref >60)
Glucose, Bld: 209 mg/dL — ABNORMAL HIGH (ref 70–99)
Potassium: 3.9 mmol/L (ref 3.5–5.1)
Sodium: 130 mmol/L — ABNORMAL LOW (ref 135–145)
Total Bilirubin: 0.6 mg/dL (ref 0.3–1.2)
Total Protein: 6.4 g/dL — ABNORMAL LOW (ref 6.5–8.1)

## 2020-08-20 LAB — CBC WITH DIFFERENTIAL/PLATELET
Abs Immature Granulocytes: 0.09 10*3/uL — ABNORMAL HIGH (ref 0.00–0.07)
Basophils Absolute: 0 10*3/uL (ref 0.0–0.1)
Basophils Relative: 0 %
Eosinophils Absolute: 0.2 10*3/uL (ref 0.0–0.5)
Eosinophils Relative: 2 %
HCT: 40.5 % (ref 36.0–46.0)
Hemoglobin: 13.4 g/dL (ref 12.0–15.0)
Immature Granulocytes: 1 %
Lymphocytes Relative: 14 %
Lymphs Abs: 1.2 10*3/uL (ref 0.7–4.0)
MCH: 30.3 pg (ref 26.0–34.0)
MCHC: 33.1 g/dL (ref 30.0–36.0)
MCV: 91.6 fL (ref 80.0–100.0)
Monocytes Absolute: 1.1 10*3/uL — ABNORMAL HIGH (ref 0.1–1.0)
Monocytes Relative: 13 %
Neutro Abs: 6 10*3/uL (ref 1.7–7.7)
Neutrophils Relative %: 70 %
Platelets: 261 10*3/uL (ref 150–400)
RBC: 4.42 MIL/uL (ref 3.87–5.11)
RDW: 14.6 % (ref 11.5–15.5)
WBC: 8.6 10*3/uL (ref 4.0–10.5)
nRBC: 0 % (ref 0.0–0.2)

## 2020-08-20 LAB — URINALYSIS, ROUTINE W REFLEX MICROSCOPIC
Bacteria, UA: NONE SEEN
Bilirubin Urine: NEGATIVE
Glucose, UA: >=500 mg/dL — AB
Ketones, ur: NEGATIVE mg/dL
Leukocytes,Ua: NEGATIVE
Nitrite: NEGATIVE
Protein, ur: NEGATIVE mg/dL
Specific Gravity, Urine: 1.011 (ref 1.005–1.030)
pH: 6 (ref 5.0–8.0)

## 2020-08-20 LAB — TROPONIN I (HIGH SENSITIVITY): Troponin I (High Sensitivity): 30 ng/L — ABNORMAL HIGH (ref <18)

## 2020-08-20 LAB — RESP PANEL BY RT-PCR (FLU A&B, COVID) ARPGX2
Influenza A by PCR: NEGATIVE
Influenza B by PCR: NEGATIVE
SARS Coronavirus 2 by RT PCR: NEGATIVE

## 2020-08-20 LAB — LIPASE, BLOOD: Lipase: 59 U/L — ABNORMAL HIGH (ref 11–51)

## 2020-08-20 LAB — LACTIC ACID, PLASMA: Lactic Acid, Venous: 3.1 mmol/L (ref 0.5–1.9)

## 2020-08-20 MED ORDER — SODIUM CHLORIDE 0.9 % IV SOLN
1.0000 g | Freq: Once | INTRAVENOUS | Status: AC
  Administered 2020-08-20: 1 g via INTRAVENOUS
  Filled 2020-08-20: qty 10

## 2020-08-20 MED ORDER — ONDANSETRON HCL 4 MG/2ML IJ SOLN
4.0000 mg | Freq: Once | INTRAMUSCULAR | Status: AC
  Administered 2020-08-20: 4 mg via INTRAVENOUS
  Filled 2020-08-20: qty 2

## 2020-08-20 MED ORDER — SODIUM CHLORIDE 0.9 % IV SOLN
500.0000 mg | Freq: Once | INTRAVENOUS | Status: AC
  Administered 2020-08-20: 500 mg via INTRAVENOUS
  Filled 2020-08-20: qty 500

## 2020-08-20 MED ORDER — SODIUM CHLORIDE 0.9 % IV BOLUS
1000.0000 mL | Freq: Once | INTRAVENOUS | Status: AC
  Administered 2020-08-21: 1000 mL via INTRAVENOUS

## 2020-08-20 MED ORDER — METOPROLOL TARTRATE 25 MG PO TABS: ORAL_TABLET | ORAL | 2 refills | Status: DC

## 2020-08-20 NOTE — ED Provider Notes (Signed)
Emergency Department Provider Note   I have reviewed the triage vital signs and the nursing notes.   HISTORY  Chief Complaint Altered Mental Status   HPI [name] is a 80 y.o. female with PMH reviewed below and is to the emergency department with altered mental status.  Feels like she is confused and that this began around midmorning.  She is having some abdominal discomfort with urinary frequency.  She is feeling slightly short of breath without chest pain.  She denies having any headaches.  Is not feeling unilateral weakness or numbness.  She feels like it is difficult to get her words out and that has been constant since earlier this morning.  She states that when she spoke with her family they grew concerned and ultimately called EMS.  Denies any new medications.  She did complain of some nausea and EMS gave 4 mg of Zofran en route.   Level 5 caveat: Confusion.   Past Medical History:  Diagnosis Date   Bipolar disorder (HCC)    Depression    Bipolar   GERD (gastroesophageal reflux disease)    History of hiatal hernia    History of kidney stones    History of stomach ulcers 1970s   "bleeding"   Hyperlipidemia    Hypertension    Hypothyroidism    Ischemic colitis, enteritis, or enterocolitis (HCC)    OSA on CPAP    Pericarditis 05/06/2017   Pneumonia    "several times" (06/23/2017)   Thyroid disease    TIA (transient ischemic attack) 2011 X 2   Type II diabetes mellitus (HCC)    Urinary bladder incontinence    Vertigo     Patient Active Problem List   Diagnosis Date Noted   CAP (community acquired pneumonia) 08/21/2020   Sepsis (HCC) 08/21/2020   Acute metabolic encephalopathy 08/21/2020   Elevated troponin 08/21/2020   Abdominal pain 08/21/2020   Acute lower UTI 06/22/2017   AKI (acute kidney injury) (HCC) 06/22/2017   Syncope 06/22/2017   Staring episodes 05/21/2017   Hyponatremia 05/21/2017   Bilateral pleural effusion    Pleural effusion on left     Cough    History of pericarditis 05/06/2017   Pericardial effusion 05/04/2017   OSA (obstructive sleep apnea) 02/18/2017   Dyspnea 01/27/2015   Atypical chest pain 12/24/2014   Accelerating angina (HCC) 12/23/2014   Chest pain at rest 10/30/2013   Exertional dyspnea 10/30/2013   Hyperlipidemia 10/30/2013   Morbid obesity (HCC) 10/30/2013   Acquired autoimmune hypothyroidism 09/02/2013   Type 2 diabetes mellitus without complications (HCC) 06/14/2013   Essential hypertension, benign 06/14/2013   Hyperlipemia 06/14/2013    Past Surgical History:  Procedure Laterality Date   APPENDECTOMY     BREAST CYST ASPIRATION Bilateral    CARDIAC CATHETERIZATION N/A 12/24/2014   Procedure: Right/Left Heart Cath and Coronary Angiography;  Surgeon: Thurmon Fair, MD;  Location: [facility];  Service: Cardiovascular;  Laterality: N/A;   CATARACT EXTRACTION W/ INTRAOCULAR LENS  IMPLANT, BILATERAL Bilateral    EXCISIONAL HEMORRHOIDECTOMY     EYE SURGERY     FRACTURE SURGERY     IR THORACENTESIS ASP PLEURAL SPACE W/IMG GUIDE  05/13/2017   LAPAROSCOPIC CHOLECYSTECTOMY     LITHOTRIPSY  "several times"   RETINAL DETACHMENT SURGERY     "think it was on the left; not sure" (06/23/2017)   TONSILLECTOMY     VAGINAL HYSTERECTOMY     partial     Allergies Flagyl [  metronidazole hcl], Ciprofloxacin, Metformin and related, Milk-related compounds, and Other  Family History  Problem Relation Age of Onset   Heart disease Mother    Stroke Father    Parkinson's disease Father    Cancer Sister    Cancer Brother     Social History Social History   Tobacco Use   Smoking status: Never   Smokeless tobacco: Never  Vaping Use   Vaping Use: Never used  Substance Use Topics   Alcohol use: Never   Drug use: Never    Review of Systems  Constitutional: No fever/chills Eyes: No visual changes. ENT: No sore throat. Cardiovascular: Denies chest pain. Respiratory: Positive shortness of  breath. Gastrointestinal: Positive abdominal pain. Positive nausea, no vomiting.  No diarrhea.  No constipation. Genitourinary: Negative for dysuria. Musculoskeletal: Negative for back pain. Skin: Negative for rash. Neurological: Negative for headaches, focal weakness or numbness. Positive confusion and speech difficulty.   10-point ROS otherwise negative.  ____________________________________________   PHYSICAL EXAM:  VITAL SIGNS: ED Triage Vitals  Enc Vitals Group     BP 08/20/20 2047 (!) 155/119     Pulse Rate 08/20/20 2047 92     Resp 08/20/20 2047 (!) 30     Temp 08/20/20 2047 99.3 F (37.4 C)     Temp Source 08/20/20 2047 Oral     SpO2 08/20/20 2047 94 %   Constitutional: Alert and oriented to self and place. No acute distress but some dyspnea noted and speech is somewhat hesitant.  Eyes: Conjunctivae are normal.  Head: Atraumatic. Nose: No congestion/rhinnorhea. Mouth/Throat: Mucous membranes are slightly dry.  Neck: No stridor.   Cardiovascular: Normal rate, regular rhythm. Good peripheral circulation. Grossly normal heart sounds.   Respiratory: Normal respiratory effort.  No retractions. Lungs CTAB. Gastrointestinal: Soft and nontender. Mild distention.  Musculoskeletal: No lower extremity tenderness with trace bilateral LE edema. No gross deformities of extremities. Neurologic: Speech is somewhat hesitant but clear. No gross focal neurologic deficits are appreciated.  Skin:  Skin is warm, dry and intact. No rash noted.   ____________________________________________   LABS (all labs ordered are listed, but only abnormal results are displayed)  Labs Reviewed  COMPREHENSIVE METABOLIC PANEL - Abnormal; Notable for the following components:      Result Value   Sodium 130 (*)    Chloride 96 (*)    Glucose, Bld 209 (*)    Creatinine, Ser 1.10 (*)    Calcium 8.3 (*)    Total Protein 6.4 (*)    Albumin 3.4 (*)    GFR, Estimated 51 (*)    All other components  within normal limits  LACTIC ACID, PLASMA - Abnormal; Notable for the following components:   Lactic Acid, Venous 3.1 (*)    All other components within normal limits  LACTIC ACID, PLASMA - Abnormal; Notable for the following components:   Lactic Acid, Venous 2.3 (*)    All other components within normal limits  CBC WITH DIFFERENTIAL/PLATELET - Abnormal; Notable for the following components:   Monocytes Absolute 1.1 (*)    Abs Immature Granulocytes 0.09 (*)    All other components within normal limits  URINALYSIS, ROUTINE W REFLEX MICROSCOPIC - Abnormal; Notable for the following components:   Glucose, UA >=500 (*)    Hgb urine dipstick SMALL (*)    All other components within normal limits  BRAIN NATRIURETIC PEPTIDE - Abnormal; Notable for the following components:   B Natriuretic Peptide 186.8 (*)    All other components within  normal limits  LIPASE, BLOOD - Abnormal; Notable for the following components:   Lipase 59 (*)    All other components within normal limits  BASIC METABOLIC PANEL - Abnormal; Notable for the following components:   Glucose, Bld 141 (*)    Creatinine, Ser 1.01 (*)    Calcium 8.2 (*)    GFR, Estimated 57 (*)    All other components within normal limits  CBG MONITORING, ED - Abnormal; Notable for the following components:   Glucose-Capillary 132 (*)    All other components within normal limits  CBG MONITORING, ED - Abnormal; Notable for the following components:   Glucose-Capillary 199 (*)    All other components within normal limits  TROPONIN I (HIGH SENSITIVITY) - Abnormal; Notable for the following components:   Troponin I (High Sensitivity) 30 (*)    All other components within normal limits  TROPONIN I (HIGH SENSITIVITY) - Abnormal; Notable for the following components:   Troponin I (High Sensitivity) 25 (*)    All other components within normal limits  CULTURE, BLOOD (ROUTINE X 2)  CULTURE, BLOOD (ROUTINE X 2)  RESP PANEL BY RT-PCR (FLU A&B,  COVID) ARPGX2  URINE CULTURE  GASTROINTESTINAL PANEL BY PCR, STOOL (REPLACES STOOL CULTURE)  ETHANOL  AMMONIA  TSH  LACTIC ACID, PLASMA   ____________________________________________  EKG  Sinus rhythm. Narrow QRS. Nonspecific ST and T wave changes. No ST elevation to meet STEMI criteria.  ____________________________________________  RADIOLOGY  CT ABDOMEN PELVIS WO CONTRAST  Result Date: 08/20/2020 CLINICAL DATA:  Acute nonlocalized abdominal pain EXAM: CT ABDOMEN AND PELVIS WITHOUT CONTRAST TECHNIQUE: Multidetector CT imaging of the abdomen and pelvis was performed following the standard protocol without IV contrast. COMPARISON:  10/16/2015 FINDINGS: Lower chest: Dependent consolidation within the left lower lobe likely reflects atelectasis. No effusion or pneumothorax. Hepatobiliary: There is diffuse hepatic steatosis. No focal liver abnormality on this unenhanced exam. The gallbladder is surgically absent. Pancreas: Unremarkable. No pancreatic ductal dilatation or surrounding inflammatory changes. Spleen: Normal in size without focal abnormality. Adrenals/Urinary Tract: Chronic thickening of the left adrenal gland consistent with adenoma or hyperplasia. The right adrenals unremarkable. No urinary tract calculi or obstructive uropathy within either kidney. The bladder is unremarkable. Stomach/Bowel: No bowel obstruction or ileus. The appendix is surgically absent. Diverticulosis of the distal colon without diverticulitis. No bowel wall thickening or inflammatory change. Small hiatal hernia. Vascular/Lymphatic: Aortic atherosclerosis. No enlarged abdominal or pelvic lymph nodes. Reproductive: Status post hysterectomy. No adnexal masses. Other: No free fluid or free gas.  No abdominal wall hernia. Musculoskeletal: No acute or destructive bony lesions. Reconstructed images demonstrate no additional findings. IMPRESSION: 1. Diverticulosis without diverticulitis. 2. Hepatic steatosis. 3. Small hiatal  hernia. 4.  Aortic Atherosclerosis (ICD10-I70.0). Electronically Signed   By: Sharlet Salina M.D.   On: 08/20/2020 23:37   DG Abd 1 View  Result Date: 08/21/2020 CLINICAL DATA:  Abdominal pain EXAM: ABDOMEN - 1 VIEW COMPARISON:  10/01/2019 FINDINGS: The bowel gas pattern is normal. No radio-opaque calculi or other significant radiographic abnormality are seen. Surgical clips right upper quadrant IMPRESSION: Negative. Electronically Signed   By: Marlan Palau M.D.   On: 08/21/2020 08:29   CT HEAD WO CONTRAST ( )  Result Date: 08/20/2020 CLINICAL DATA:  Mental status change, unknown cause EXAM: CT HEAD WITHOUT CONTRAST TECHNIQUE: Contiguous axial images were obtained from the base of the skull through the vertex without intravenous contrast. COMPARISON:  MRI 06/23/2017 FINDINGS: Brain: Small foci of macroscopic fat within the falx  are compatible with tiny dermoids measuring up to 9 mm. Relatively mild parenchymal volume loss given the patient's age. Mild periventricular white matter changes are present likely reflecting the sequela of small vessel ischemia. No abnormal intra or extra-axial mass lesion or fluid collection. No abnormal mass effect or midline shift. No evidence of acute intracranial hemorrhage or infarct. Ventricular size is normal. Cerebellum unremarkable. Vascular: Unremarkable Skull: Intact Sinuses/Orbits: Paranasal sinuses are clear. Right scleral banding has been performed. Orbits are otherwise unremarkable. Other: Mastoid air cells and middle ear cavities are clear. IMPRESSION: No acute intracranial abnormality. Mild senescent change. Electronically Signed   By: Helyn NumbersAshesh  Parikh MD   On: 08/20/2020 23:45   CT ABDOMEN PELVIS W CONTRAST  Result Date: 08/21/2020 CLINICAL DATA:  Abdominal pain, fever EXAM: CT ABDOMEN AND PELVIS WITH CONTRAST TECHNIQUE: Multidetector CT imaging of the abdomen and pelvis was performed using the standard protocol following bolus administration of intravenous  contrast. CONTRAST:  80mL OMNIPAQUE IOHEXOL 350 MG/ML SOLN COMPARISON:  08/20/2020 FINDINGS: Lower chest: Bibasilar atelectasis or scarring. Heart is normal size. Hepatobiliary: Prior cholecystectomy. Diffuse fatty infiltration of the liver. No focal hepatic abnormality. Pancreas: No focal abnormality or ductal dilatation. Spleen: No focal abnormality.  Normal size. Adrenals/Urinary Tract: No adrenal abnormality. No focal renal abnormality. No stones or hydronephrosis. Urinary bladder is unremarkable. Stomach/Bowel: Sigmoid diverticulosis. No active diverticulitis. Stomach and small bowel decompressed, unremarkable. Vascular/Lymphatic: Scattered aortic atherosclerosis. No evidence of aneurysm or adenopathy. Reproductive: Prior hysterectomy.  No adnexal masses. Other: No free fluid or free air. Musculoskeletal: No acute bony abnormality. IMPRESSION: Hepatic steatosis. Bibasilar atelectasis or scarring, stable. Sigmoid diverticulosis.  No active diverticulitis. Aortic atherosclerosis. No acute findings. Electronically Signed   By: Charlett NoseKevin  Dover M.D.   On: 08/21/2020 09:16   DG Chest Port 1 View  Result Date: 08/21/2020 CLINICAL DATA:  Short of breath, abdominal pain EXAM: PORTABLE CHEST 1 VIEW COMPARISON:  08/20/2020 FINDINGS: Hypoventilation with mild atelectasis in the lung bases. Left lower lobe airspace disease unchanged. Cardiac enlargement with mild vascular congestion. Negative for edema or effusion. IMPRESSION: Hypoventilation with bibasilar atelectasis. Mild left lower lobe airspace disease unchanged Mild vascular congestion without edema. Electronically Signed   By: Marlan Palauharles  Clark M.D.   On: 08/21/2020 08:18   DG Chest Portable 1 View  Result Date: 08/20/2020 CLINICAL DATA:  Dyspnea EXAM: PORTABLE CHEST 1 VIEW COMPARISON:  09/05/2018 FINDINGS: Lung volumes are small and there is resultant vascular crowding at the hila. There is superimposed asymmetric interstitial pulmonary infiltrate within the left  mid and lower lung zone, infection versus asymmetric pulmonary edema. No pneumothorax or pleural effusion. Cardiac size within normal limits. No acute bone abnormality. IMPRESSION: Asymmetric left mid and lower lung zone pulmonary infiltrate, possibly infectious in the appropriate clinical setting. Pulmonary hypoinflation. Electronically Signed   By: Helyn NumbersAshesh  Parikh MD   On: 08/20/2020 21:49    ____________________________________________   PROCEDURES  Procedure(s) performed:   Procedures  None ____________________________________________   INITIAL IMPRESSION / ASSESSMENT AND PLAN / ED COURSE  Pertinent labs & imaging results that were available during my care of the patient were reviewed by me and considered in my medical decision making (see chart for details).   Patient presents to the emergency department with altered mental status.  She is complaining of some abdominal discomfort and I appreciate some distention.  Patient had some nausea.  Not particularly tender.  She also has some slight increased work of breathing but no hypoxemia.  Lungs are clear with  no wheezing or rails.  No focal deficits but some hesitant speech.  Seems more related to encephalopathy type presentation rather than acute stroke with focal neurodeficit.  Plan for CT imaging of the head along with abdomen pelvis, labs, COVID, ammonia, and UA with confusion and urine frequency.   CT head and abdomen/pelvis pending. CBC and UA resulted. COVID negative. Care transferred to Dr. Preston Fleeting pending imaging and additional labs. Plan for admit with AMS.  ____________________________________________  FINAL CLINICAL IMPRESSION(S) / ED DIAGNOSES  Final diagnoses:  Community acquired pneumonia, unspecified laterality  Confusion  Hyponatremia  Elevated lipase     MEDICATIONS GIVEN DURING THIS VISIT:  Medications  azithromycin (ZITHROMAX) 500 mg in sodium chloride 0.9 % 250 mL IVPB (has no administration in time range)   cefTRIAXone (ROCEPHIN) 1 g in sodium chloride 0.9 % 100 mL IVPB (has no administration in time range)  enoxaparin (LOVENOX) injection 40 mg (40 mg Subcutaneous Given 08/21/20 0614)  acetaminophen (TYLENOL) tablet 650 mg (650 mg Oral Given 08/21/20 0637)  ondansetron (ZOFRAN) injection 4 mg (4 mg Intravenous Given 08/21/20 0824)  traMADol (ULTRAM) tablet 50 mg (50 mg Oral Given 08/21/20 0825)  aspirin EC tablet 81 mg (81 mg Oral Given 08/21/20 0856)  pantoprazole (PROTONIX) EC tablet 40 mg (40 mg Oral Given 08/21/20 0856)  pravastatin (PRAVACHOL) tablet 20 mg (20 mg Oral Given 08/21/20 0856)  QUEtiapine (SEROQUEL) tablet 100 mg (has no administration in time range)  levothyroxine (SYNTHROID) tablet 75 mcg (has no administration in time range)  isosorbide mononitrate (IMDUR) 24 hr tablet 30 mg (30 mg Oral Given 08/21/20 0856)  folic acid (FOLVITE) tablet 1 mg (1 mg Oral Given 08/21/20 0856)  diltiazem (CARDIZEM CD) 24 hr capsule 120 mg (120 mg Oral Given 08/21/20 0854)  carbamazepine (TEGRETOL XR) 12 hr tablet 200 mg (200 mg Oral Given 08/21/20 0854)  insulin aspart (novoLOG) injection 0-15 Units (3 Units Subcutaneous Given 08/21/20 0855)  insulin aspart (novoLOG) injection 0-5 Units (has no administration in time range)  insulin glargine-yfgn (SEMGLEE) injection 15 Units (has no administration in time range)  furosemide (LASIX) injection 40 mg (has no administration in time range)  cefTRIAXone (ROCEPHIN) 1 g in sodium chloride 0.9 % 100 mL IVPB (0 g Intravenous Stopped 08/20/20 2345)  azithromycin (ZITHROMAX) 500 mg in sodium chloride 0.9 % 250 mL IVPB (0 mg Intravenous Stopped 08/20/20 2359)  ondansetron (ZOFRAN) injection 4 mg (4 mg Intravenous Given 08/20/20 2303)  sodium chloride 0.9 % bolus 1,000 mL (0 mLs Intravenous Stopped 08/21/20 0320)  iohexol (OMNIPAQUE) 350 MG/ML injection 80 mL (80 mLs Intravenous Contrast Given 08/21/20 0853)     Note:  This document was prepared using Dragon voice recognition software and  may include unintentional dictation errors.  Alona Bene, MD, [facility] Emergency Medicine    Teighan Aubert, Arlyss Repress, MD 08/21/20 1102

## 2020-08-20 NOTE — ED Notes (Signed)
Pt not in RM when phlebotomy went to collect labs. Will check again later.

## 2020-08-20 NOTE — ED Provider Notes (Signed)
Care assumed from Dr. Jacqulyn Bath, patient with altered mental status, elevated lactic acid level, chest x-ray consistent with pneumonia and started on antibiotics for community-acquired pneumonia.  Also, abdominal distention, CT of head and abdomen and pelvis are pending as are chemistries.  She will need to be admitted once labs and imaging are back.  Labs show mild hyponatremia. CT scans show no acute process. Her son is here and says mental status is not improved. Case is discussed with Dr. Loney Loh of [facility], who agrees to admit the patient.  Results for orders placed or performed during the hospital encounter of 08/20/20  Resp Panel by RT-PCR (Flu A&B, Covid) Nasopharyngeal Swab   Specimen: Nasopharyngeal Swab; Nasopharyngeal(NP) swabs in vial transport medium  Result Value Ref Range   SARS Coronavirus 2 by RT PCR NEGATIVE NEGATIVE   Influenza A by PCR NEGATIVE NEGATIVE   Influenza B by PCR NEGATIVE NEGATIVE  Comprehensive metabolic panel  Result Value Ref Range   Sodium 130 (L) 135 - 145 mmol/L   Potassium 3.9 3.5 - 5.1 mmol/L   Chloride 96 (L) 98 - 111 mmol/L   CO2 24 22 - 32 mmol/L   Glucose, Bld 209 (H) 70 - 99 mg/dL   BUN 20 8 - 23 mg/dL   Creatinine, Ser 8.46 (H) 0.44 - 1.00 mg/dL   Calcium 8.3 (L) 8.9 - 10.3 mg/dL   Total Protein 6.4 (L) 6.5 - 8.1 g/dL   Albumin 3.4 (L) 3.5 - 5.0 g/dL   AST 22 15 - 41 U/L   ALT 31 0 - 44 U/L   Alkaline Phosphatase 76 38 - 126 U/L   Total Bilirubin 0.6 0.3 - 1.2 mg/dL   GFR, Estimated 51 (L) >60 mL/min   Anion gap 10 5 - 15  Ethanol  Result Value Ref Range   Alcohol, Ethyl (B) <10 <10 mg/dL  Lactic acid, plasma  Result Value Ref Range   Lactic Acid, Venous 3.1 (HH) 0.5 - 1.9 mmol/L  Lactic acid, plasma  Result Value Ref Range   Lactic Acid, Venous 2.3 (HH) 0.5 - 1.9 mmol/L  CBC with Differential  Result Value Ref Range   WBC 8.6 4.0 - 10.5 K/uL   RBC 4.42 3.87 - 5.11 MIL/uL   Hemoglobin 13.4 12.0 - 15.0 g/dL   HCT 65.9 93.5  - 70.1 %   MCV 91.6 80.0 - 100.0 fL   MCH 30.3 26.0 - 34.0 pg   MCHC 33.1 30.0 - 36.0 g/dL   RDW 77.9 39.0 - 30.0 %   Platelets 261 150 - 400 K/uL   nRBC 0.0 0.0 - 0.2 %   Neutrophils Relative % 70 %   Neutro Abs 6.0 1.7 - 7.7 K/uL   Lymphocytes Relative 14 %   Lymphs Abs 1.2 0.7 - 4.0 K/uL   Monocytes Relative 13 %   Monocytes Absolute 1.1 (H) 0.1 - 1.0 K/uL   Eosinophils Relative 2 %   Eosinophils Absolute 0.2 0.0 - 0.5 K/uL   Basophils Relative 0 %   Basophils Absolute 0.0 0.0 - 0.1 K/uL   Immature Granulocytes 1 %   Abs Immature Granulocytes 0.09 (H) 0.00 - 0.07 K/uL  Urinalysis, Routine w reflex microscopic Urine, Clean Catch  Result Value Ref Range   Color, Urine YELLOW YELLOW   APPearance CLEAR CLEAR   Specific Gravity, Urine 1.011 1.005 - 1.030   pH 6.0 5.0 - 8.0   Glucose, UA >=500 (A) NEGATIVE mg/dL   Hgb urine dipstick SMALL (  A) NEGATIVE   Bilirubin Urine NEGATIVE NEGATIVE   Ketones, ur NEGATIVE NEGATIVE mg/dL   Protein, ur NEGATIVE NEGATIVE mg/dL   Nitrite NEGATIVE NEGATIVE   Leukocytes,Ua NEGATIVE NEGATIVE   WBC, UA 0-5 0 - 5 WBC/hpf   Bacteria, UA NONE SEEN NONE SEEN   Squamous Epithelial / LPF 0-5 0 - 5   Mucus PRESENT   Ammonia  Result Value Ref Range   Ammonia 29 9 - 35 umol/L  TSH  Result Value Ref Range   TSH 1.526 0.350 - 4.500 uIU/mL  Brain natriuretic peptide  Result Value Ref Range   B Natriuretic Peptide 186.8 (H) 0.0 - 100.0 pg/mL  Lipase, blood  Result Value Ref Range   Lipase 59 (H) 11 - 51 U/L  Troponin I (High Sensitivity)  Result Value Ref Range   Troponin I (High Sensitivity) 30 (H) <18 ng/L  Troponin I (High Sensitivity)  Result Value Ref Range   Troponin I (High Sensitivity) 25 (H) <18 ng/L   CT ABDOMEN PELVIS WO CONTRAST  Result Date: 08/20/2020 CLINICAL DATA:  Acute nonlocalized abdominal pain EXAM: CT ABDOMEN AND PELVIS WITHOUT CONTRAST TECHNIQUE: Multidetector CT imaging of the abdomen and pelvis was performed following the  standard protocol without IV contrast. COMPARISON:  10/16/2015 FINDINGS: Lower chest: Dependent consolidation within the left lower lobe likely reflects atelectasis. No effusion or pneumothorax. Hepatobiliary: There is diffuse hepatic steatosis. No focal liver abnormality on this unenhanced exam. The gallbladder is surgically absent. Pancreas: Unremarkable. No pancreatic ductal dilatation or surrounding inflammatory changes. Spleen: Normal in size without focal abnormality. Adrenals/Urinary Tract: Chronic thickening of the left adrenal gland consistent with adenoma or hyperplasia. The right adrenals unremarkable. No urinary tract calculi or obstructive uropathy within either kidney. The bladder is unremarkable. Stomach/Bowel: No bowel obstruction or ileus. The appendix is surgically absent. Diverticulosis of the distal colon without diverticulitis. No bowel wall thickening or inflammatory change. Small hiatal hernia. Vascular/Lymphatic: Aortic atherosclerosis. No enlarged abdominal or pelvic lymph nodes. Reproductive: Status post hysterectomy. No adnexal masses. Other: No free fluid or free gas.  No abdominal wall hernia. Musculoskeletal: No acute or destructive bony lesions. Reconstructed images demonstrate no additional findings. IMPRESSION: 1. Diverticulosis without diverticulitis. 2. Hepatic steatosis. 3. Small hiatal hernia. 4.  Aortic Atherosclerosis (ICD10-I70.0). Electronically Signed   By: Sharlet Salina M.D.   On: 08/20/2020 23:37   CT HEAD WO CONTRAST ( )  Result Date: 08/20/2020 CLINICAL DATA:  Mental status change, unknown cause EXAM: CT HEAD WITHOUT CONTRAST TECHNIQUE: Contiguous axial images were obtained from the base of the skull through the vertex without intravenous contrast. COMPARISON:  MRI 06/23/2017 FINDINGS: Brain: Small foci of macroscopic fat within the falx are compatible with tiny dermoids measuring up to 9 mm. Relatively mild parenchymal volume loss given the patient's age. Mild  periventricular white matter changes are present likely reflecting the sequela of small vessel ischemia. No abnormal intra or extra-axial mass lesion or fluid collection. No abnormal mass effect or midline shift. No evidence of acute intracranial hemorrhage or infarct. Ventricular size is normal. Cerebellum unremarkable. Vascular: Unremarkable Skull: Intact Sinuses/Orbits: Paranasal sinuses are clear. Right scleral banding has been performed. Orbits are otherwise unremarkable. Other: Mastoid air cells and middle ear cavities are clear. IMPRESSION: No acute intracranial abnormality. Mild senescent change. Electronically Signed   By: Helyn Numbers MD   On: 08/20/2020 23:45   DG Chest Portable 1 View  Result Date: 08/20/2020 CLINICAL DATA:  Dyspnea EXAM: PORTABLE CHEST 1 VIEW COMPARISON:  09/05/2018  FINDINGS: Lung volumes are small and there is resultant vascular crowding at the hila. There is superimposed asymmetric interstitial pulmonary infiltrate within the left mid and lower lung zone, infection versus asymmetric pulmonary edema. No pneumothorax or pleural effusion. Cardiac size within normal limits. No acute bone abnormality. IMPRESSION: Asymmetric left mid and lower lung zone pulmonary infiltrate, possibly infectious in the appropriate clinical setting. Pulmonary hypoinflation. Electronically Signed   By: Helyn Numbers MD   On: 08/20/2020 21:49      Dione Booze, MD 08/21/20 [phone]

## 2020-08-20 NOTE — ED Notes (Signed)
Patient transported to CT 

## 2020-08-20 NOTE — ED Triage Notes (Signed)
Pt BIB [facility] from home for AMS. Pt was LKN at 1000 by a neighbor, was on the phone w/ son tonight at 73 and son thought something wasn't right, asked a neighbor to go check on her. Neighbor found keys still in the door, pt was inside and confused. Pt c/o abd pain w/ nausea. EMS gave 4mg  zofran IV. 20g L hand, 142/78, 92HR, 18 RR, 96% RA. Pt is short of breath in triage, alert to self and place. [place], [place], [phone]

## 2020-08-21 ENCOUNTER — Inpatient Hospital Stay (HOSPITAL_COMMUNITY): Payer: Medicare PPO

## 2020-08-21 DIAGNOSIS — J189 Pneumonia, unspecified organism: Secondary | ICD-10-CM | POA: Diagnosis present

## 2020-08-21 DIAGNOSIS — E872 Acidosis: Secondary | ICD-10-CM | POA: Diagnosis present

## 2020-08-21 DIAGNOSIS — E871 Hypo-osmolality and hyponatremia: Secondary | ICD-10-CM | POA: Diagnosis present

## 2020-08-21 DIAGNOSIS — I5033 Acute on chronic diastolic (congestive) heart failure: Secondary | ICD-10-CM | POA: Diagnosis present

## 2020-08-21 DIAGNOSIS — F319 Bipolar disorder, unspecified: Secondary | ICD-10-CM | POA: Diagnosis present

## 2020-08-21 DIAGNOSIS — R7989 Other specified abnormal findings of blood chemistry: Secondary | ICD-10-CM

## 2020-08-21 DIAGNOSIS — R410 Disorientation, unspecified: Secondary | ICD-10-CM | POA: Diagnosis present

## 2020-08-21 DIAGNOSIS — Z6841 Body Mass Index (BMI) 40.0 and over, adult: Secondary | ICD-10-CM | POA: Diagnosis not present

## 2020-08-21 DIAGNOSIS — G9341 Metabolic encephalopathy: Secondary | ICD-10-CM

## 2020-08-21 DIAGNOSIS — E119 Type 2 diabetes mellitus without complications: Secondary | ICD-10-CM | POA: Diagnosis present

## 2020-08-21 DIAGNOSIS — Z961 Presence of intraocular lens: Secondary | ICD-10-CM | POA: Diagnosis present

## 2020-08-21 DIAGNOSIS — K5900 Constipation, unspecified: Secondary | ICD-10-CM | POA: Diagnosis present

## 2020-08-21 DIAGNOSIS — E039 Hypothyroidism, unspecified: Secondary | ICD-10-CM | POA: Diagnosis present

## 2020-08-21 DIAGNOSIS — G4733 Obstructive sleep apnea (adult) (pediatric): Secondary | ICD-10-CM | POA: Diagnosis present

## 2020-08-21 DIAGNOSIS — R350 Frequency of micturition: Secondary | ICD-10-CM | POA: Diagnosis present

## 2020-08-21 DIAGNOSIS — K219 Gastro-esophageal reflux disease without esophagitis: Secondary | ICD-10-CM | POA: Diagnosis present

## 2020-08-21 DIAGNOSIS — I110 Hypertensive heart disease with heart failure: Secondary | ICD-10-CM | POA: Diagnosis present

## 2020-08-21 DIAGNOSIS — A419 Sepsis, unspecified organism: Secondary | ICD-10-CM | POA: Diagnosis present

## 2020-08-21 DIAGNOSIS — Z66 Do not resuscitate: Secondary | ICD-10-CM | POA: Diagnosis present

## 2020-08-21 DIAGNOSIS — I248 Other forms of acute ischemic heart disease: Secondary | ICD-10-CM | POA: Diagnosis present

## 2020-08-21 DIAGNOSIS — R109 Unspecified abdominal pain: Secondary | ICD-10-CM

## 2020-08-21 DIAGNOSIS — K5730 Diverticulosis of large intestine without perforation or abscess without bleeding: Secondary | ICD-10-CM | POA: Diagnosis present

## 2020-08-21 DIAGNOSIS — R0602 Shortness of breath: Secondary | ICD-10-CM | POA: Diagnosis not present

## 2020-08-21 DIAGNOSIS — Z87442 Personal history of urinary calculi: Secondary | ICD-10-CM | POA: Diagnosis not present

## 2020-08-21 DIAGNOSIS — Z20822 Contact with and (suspected) exposure to covid-19: Secondary | ICD-10-CM | POA: Diagnosis present

## 2020-08-21 DIAGNOSIS — K760 Fatty (change of) liver, not elsewhere classified: Secondary | ICD-10-CM | POA: Diagnosis present

## 2020-08-21 DIAGNOSIS — E785 Hyperlipidemia, unspecified: Secondary | ICD-10-CM | POA: Diagnosis present

## 2020-08-21 DIAGNOSIS — R778 Other specified abnormalities of plasma proteins: Secondary | ICD-10-CM

## 2020-08-21 LAB — BRAIN NATRIURETIC PEPTIDE: B Natriuretic Peptide: 186.8 pg/mL — ABNORMAL HIGH (ref 0.0–100.0)

## 2020-08-21 LAB — CBG MONITORING, ED
Glucose-Capillary: 132 mg/dL — ABNORMAL HIGH (ref 70–99)
Glucose-Capillary: 199 mg/dL — ABNORMAL HIGH (ref 70–99)
Glucose-Capillary: 144 mg/dL — ABNORMAL HIGH (ref 70–99)
Glucose-Capillary: 213 mg/dL — ABNORMAL HIGH (ref 70–99)

## 2020-08-21 LAB — TROPONIN I (HIGH SENSITIVITY): Troponin I (High Sensitivity): 25 ng/L — ABNORMAL HIGH (ref <18)

## 2020-08-21 LAB — TSH: TSH: 1.526 u[IU]/mL (ref 0.350–4.500)

## 2020-08-21 LAB — BASIC METABOLIC PANEL
Anion gap: 9 (ref 5–15)
BUN: 17 mg/dL (ref 8–23)
CO2: 26 mmol/L (ref 22–32)
Calcium: 8.2 mg/dL — ABNORMAL LOW (ref 8.9–10.3)
Chloride: 104 mmol/L (ref 98–111)
Creatinine, Ser: 1.01 mg/dL — ABNORMAL HIGH (ref 0.44–1.00)
GFR, Estimated: 57 mL/min — ABNORMAL LOW (ref >60)
Glucose, Bld: 141 mg/dL — ABNORMAL HIGH (ref 70–99)
Potassium: 4.2 mmol/L (ref 3.5–5.1)
Sodium: 139 mmol/L (ref 135–145)

## 2020-08-21 LAB — LACTIC ACID, PLASMA
Lactic Acid, Venous: 2.3 mmol/L (ref 0.5–1.9)
Lactic Acid, Venous: 1.9 mmol/L (ref 0.5–1.9)

## 2020-08-21 LAB — GLUCOSE, CAPILLARY: Glucose-Capillary: 203 mg/dL — ABNORMAL HIGH (ref 70–99)

## 2020-08-21 LAB — ETHANOL: Alcohol, Ethyl (B): <10 mg/dL (ref <10)

## 2020-08-21 LAB — AMMONIA: Ammonia: 29 umol/L (ref 9–35)

## 2020-08-21 MED ORDER — QUETIAPINE FUMARATE 100 MG PO TABS
100.0000 mg | ORAL_TABLET | Freq: Every day | ORAL | Status: DC
  Administered 2020-08-21 – 2020-08-25 (×5): 100 mg via ORAL
  Filled 2020-08-21: qty 1
  Filled 2020-08-21: qty 2
  Filled 2020-08-21 (×3): qty 1

## 2020-08-21 MED ORDER — ISOSORBIDE MONONITRATE ER 30 MG PO TB24
30.0000 mg | ORAL_TABLET | Freq: Every day | ORAL | Status: DC
  Administered 2020-08-21 – 2020-08-23 (×3): 30 mg via ORAL
  Filled 2020-08-21 (×3): qty 1

## 2020-08-21 MED ORDER — PRAVASTATIN SODIUM 10 MG PO TABS
20.0000 mg | ORAL_TABLET | Freq: Every day | ORAL | Status: DC
  Administered 2020-08-21 – 2020-08-26 (×6): 20 mg via ORAL
  Filled 2020-08-21 (×6): qty 2

## 2020-08-21 MED ORDER — FUROSEMIDE 10 MG/ML IJ SOLN
40.0000 mg | Freq: Once | INTRAMUSCULAR | Status: AC
  Administered 2020-08-21: 40 mg via INTRAVENOUS
  Filled 2020-08-21: qty 4

## 2020-08-21 MED ORDER — CARBAMAZEPINE ER 100 MG PO TB12
200.0000 mg | ORAL_TABLET | Freq: Two times a day (BID) | ORAL | Status: DC
  Administered 2020-08-21 – 2020-08-26 (×11): 200 mg via ORAL
  Filled 2020-08-21 (×12): qty 2

## 2020-08-21 MED ORDER — LEVOTHYROXINE SODIUM 75 MCG PO TABS
75.0000 ug | ORAL_TABLET | Freq: Every day | ORAL | Status: DC
  Administered 2020-08-22 – 2020-08-26 (×5): 75 ug via ORAL
  Filled 2020-08-21 (×6): qty 1

## 2020-08-21 MED ORDER — CEFTRIAXONE SODIUM 1 G IJ SOLR
1.0000 g | INTRAMUSCULAR | Status: DC
  Administered 2020-08-21 – 2020-08-24 (×4): 1 g via INTRAVENOUS
  Filled 2020-08-21 (×4): qty 10

## 2020-08-21 MED ORDER — IOHEXOL 350 MG/ML SOLN
80.0000 mL | Freq: Once | INTRAVENOUS | Status: AC | PRN
  Administered 2020-08-21: 80 mL via INTRAVENOUS

## 2020-08-21 MED ORDER — FOLIC ACID 1 MG PO TABS
1.0000 mg | ORAL_TABLET | Freq: Every day | ORAL | Status: DC
  Administered 2020-08-21 – 2020-08-26 (×6): 1 mg via ORAL
  Filled 2020-08-21 (×6): qty 1

## 2020-08-21 MED ORDER — ACETAMINOPHEN 650 MG RE SUPP: 650.0000 mg | Freq: Four times a day (QID) | RECTAL | Status: DC | PRN

## 2020-08-21 MED ORDER — INSULIN GLARGINE-YFGN 100 UNIT/ML ~~LOC~~ SOLN
15.0000 [IU] | Freq: Every day | SUBCUTANEOUS | Status: DC
  Administered 2020-08-21 – 2020-08-25 (×5): 15 [IU] via SUBCUTANEOUS
  Filled 2020-08-21 (×6): qty 0.15

## 2020-08-21 MED ORDER — ONDANSETRON HCL 4 MG/2ML IJ SOLN
4.0000 mg | Freq: Four times a day (QID) | INTRAMUSCULAR | Status: DC | PRN
  Administered 2020-08-21: 4 mg via INTRAVENOUS
  Filled 2020-08-21: qty 2

## 2020-08-21 MED ORDER — TRAMADOL HCL 50 MG PO TABS
50.0000 mg | ORAL_TABLET | Freq: Four times a day (QID) | ORAL | Status: DC | PRN
Start: 2020-08-21 — End: 2020-08-27
  Administered 2020-08-21 (×3): 50 mg via ORAL
  Filled 2020-08-21 (×3): qty 1

## 2020-08-21 MED ORDER — ENOXAPARIN SODIUM 40 MG/0.4ML IJ SOSY
40.0000 mg | PREFILLED_SYRINGE | INTRAMUSCULAR | Status: DC
  Administered 2020-08-21 – 2020-08-26 (×6): 40 mg via SUBCUTANEOUS
  Filled 2020-08-21 (×6): qty 0.4

## 2020-08-21 MED ORDER — AZITHROMYCIN 500 MG IV SOLR
500.0000 mg | INTRAVENOUS | Status: AC
Start: 2020-08-21 — End: 2020-08-23
  Administered 2020-08-21 – 2020-08-23 (×3): 500 mg via INTRAVENOUS
  Filled 2020-08-21 (×3): qty 500

## 2020-08-21 MED ORDER — PANTOPRAZOLE SODIUM 40 MG PO TBEC
40.0000 mg | DELAYED_RELEASE_TABLET | Freq: Every day | ORAL | Status: DC
  Administered 2020-08-21 – 2020-08-26 (×6): 40 mg via ORAL
  Filled 2020-08-21 (×6): qty 1

## 2020-08-21 MED ORDER — ACETAMINOPHEN 325 MG PO TABS
650.0000 mg | ORAL_TABLET | Freq: Four times a day (QID) | ORAL | Status: DC | PRN
  Administered 2020-08-21: 650 mg via ORAL
  Filled 2020-08-21: qty 2

## 2020-08-21 MED ORDER — SODIUM CHLORIDE 0.9 % IV SOLN
12.5000 mg | Freq: Four times a day (QID) | INTRAVENOUS | Status: DC | PRN
  Filled 2020-08-21: qty 0.5

## 2020-08-21 MED ORDER — ASPIRIN EC 81 MG PO TBEC
81.0000 mg | DELAYED_RELEASE_TABLET | Freq: Every day | ORAL | Status: DC
  Administered 2020-08-21 – 2020-08-26 (×6): 81 mg via ORAL
  Filled 2020-08-21 (×6): qty 1

## 2020-08-21 MED ORDER — INSULIN ASPART 100 UNIT/ML IJ SOLN
0.0000 [IU] | Freq: Every day | INTRAMUSCULAR | Status: DC
  Administered 2020-08-21 – 2020-08-25 (×3): 2 [IU] via SUBCUTANEOUS

## 2020-08-21 MED ORDER — DILTIAZEM HCL ER COATED BEADS 120 MG PO CP24
120.0000 mg | ORAL_CAPSULE | Freq: Every day | ORAL | Status: DC
  Administered 2020-08-21 – 2020-08-25 (×5): 120 mg via ORAL
  Filled 2020-08-21 (×5): qty 1

## 2020-08-21 MED ORDER — INSULIN ASPART 100 UNIT/ML IJ SOLN
0.0000 [IU] | Freq: Three times a day (TID) | INTRAMUSCULAR | Status: DC
  Administered 2020-08-21: 5 [IU] via SUBCUTANEOUS
  Administered 2020-08-21: 2 [IU] via SUBCUTANEOUS
  Administered 2020-08-21 – 2020-08-22 (×4): 3 [IU] via SUBCUTANEOUS
  Administered 2020-08-23: 5 [IU] via SUBCUTANEOUS
  Administered 2020-08-23 (×2): 3 [IU] via SUBCUTANEOUS
  Administered 2020-08-24: 8 [IU] via SUBCUTANEOUS
  Administered 2020-08-24: 3 [IU] via SUBCUTANEOUS
  Administered 2020-08-24: 8 [IU] via SUBCUTANEOUS
  Administered 2020-08-25: 3 [IU] via SUBCUTANEOUS
  Administered 2020-08-25 (×2): 5 [IU] via SUBCUTANEOUS
  Administered 2020-08-26: 8 [IU] via SUBCUTANEOUS
  Administered 2020-08-26: 5 [IU] via SUBCUTANEOUS

## 2020-08-21 MED ORDER — SODIUM CHLORIDE 0.9 % IV SOLN: INTRAVENOUS | Status: DC

## 2020-08-21 MED ORDER — OLANZAPINE 10 MG PO TABS: 20.0000 mg | ORAL_TABLET | Freq: Every day | ORAL | Status: DC

## 2020-08-21 NOTE — ED Notes (Signed)
Ambulated to toilet.

## 2020-08-21 NOTE — ED Notes (Signed)
CBG 213 

## 2020-08-21 NOTE — H&P (Addendum)
History and Physical    [name] DSK:[id number] DOB: November 13, 1940 DOA: 08/20/2020  PCP: Merrilee Seashore, MD Patient coming from: Home  Chief Complaint: Altered mental status  HPI: [name] is a 80 y.o. female with medical history significant of insulin-dependent type 2 diabetes, obesity (BMI 41.10), history of TIA, history of pericarditis, hypertension, hyperlipidemia, OSA on CPAP, hypothyroidism, depression, bipolar disorder presented to the ED via EMS from home for evaluation of altered mental status.  Patient complained of abdominal pain, nausea, and shortness of breath.  In the ED, afebrile.  Tachypneic on arrival but not hypoxic.  Not tachycardic or hypotensive. Labs showing WBC 8.6, hemoglobin 13.4, platelet count 261.  Sodium 130, potassium 3.9, chloride 96, bicarb 24, BUN 20, creatinine 1.1, glucose 209.  Lipase borderline elevated at 59.  LFTs normal.  BNP 186.  Blood ethanol level undetectable.  Ammonia level normal.  TSH normal.  Lactic acid 3.1.  UA without signs of infection.  Urine culture pending.  Blood culture x2 drawn.  COVID and influenza PCR negative.  High sensitive troponin mildly elevated but stable 30>25. Patient was given Zofran, ceftriaxone, azithromycin, and 1 L normal saline bolus.  Repeat lactic acid 2.3.  Chest x-ray showing asymmetric left mid and lower lung zone pulmonary infiltrate.  Head CT negative for acute finding.  CT abdomen pelvis negative for acute finding.  Patient states she was talking to her son today on the phone and was not able to name her medications correctly.  He became concerned that she was confused and asked her to call 911.  States she has not been feeling well lately.  She is having shortness of breath and fatigue.  No cough or chest pain.  For the past 2 weeks she has had generalized abdominal pain, nausea, and diarrhea.  However, she is able to eat and has not vomited.  Denies recent antibiotic use.  Denies dysuria.  Review of  Systems:  All systems reviewed and apart from history of presenting illness, are negative.  Past Medical History:  Diagnosis Date   Bipolar disorder ([place])    Depression    Bipolar   GERD (gastroesophageal reflux disease)    History of hiatal hernia    History of kidney stones    History of stomach ulcers 1970s   "bleeding"   Hyperlipidemia    Hypertension    Hypothyroidism    Ischemic colitis, enteritis, or enterocolitis ([place])    OSA on CPAP    Pericarditis 05/06/2017   Pneumonia    "several times" (06/23/2017)   Thyroid disease    TIA (transient ischemic attack) 2011 X 2   Type II diabetes mellitus ([place])    Urinary bladder incontinence    Vertigo     Past Surgical History:  Procedure Laterality Date   APPENDECTOMY     BREAST CYST ASPIRATION Bilateral    CARDIAC CATHETERIZATION N/A 12/24/2014   Procedure: Right/Left Heart Cath and Coronary Angiography;  Surgeon: Sanda Klein, MD;  Location: [facility];  Service: Cardiovascular;  Laterality: N/A;   CATARACT EXTRACTION W/ INTRAOCULAR LENS  IMPLANT, BILATERAL Bilateral    EXCISIONAL HEMORRHOIDECTOMY     EYE SURGERY     FRACTURE SURGERY     IR THORACENTESIS ASP PLEURAL SPACE W/IMG GUIDE  05/13/2017   LAPAROSCOPIC CHOLECYSTECTOMY     LITHOTRIPSY  "several times"   RETINAL DETACHMENT SURGERY     "think it was on the left; not sure" (06/23/2017)   TONSILLECTOMY  VAGINAL HYSTERECTOMY     partial      reports that she has never smoked. She has never used smokeless tobacco. She reports that she does not drink alcohol and does not use drugs.  Allergies  Allergen Reactions   Flagyl [Metronidazole Hcl] Itching and Rash   Ciprofloxacin Itching and Rash   Metformin And Related Rash   Milk-Related Compounds Other (See Comments)    Stomach pains   Other Other (See Comments)    [place] nuts cause severe facial redness    Family History  Problem Relation Age of Onset   Heart disease Mother    Stroke Father     Parkinson's disease Father    Cancer Sister    Cancer Brother     Prior to Admission medications   Medication Sig Start Date End Date Taking? Authorizing Provider  Accu-Chek Softclix Lancets lancets Use 1-4 times daily as needed/instructed  DX E11.9 10/27/19   Elayne Snare, MD  aspirin EC 81 MG EC tablet Take 1 tablet (81 mg total) by mouth daily. 05/18/17   Shelly Coss, MD  betamethasone dipropionate (DIPROLENE) 0.05 % cream Apply 1 application topically 2 (two) times daily as needed (rash).     [provider]  Blood Glucose Monitoring Suppl (ACCU-CHEK GUIDE) w/Device KIT Use 1-4 times daily as needed/directed  DX E11.9 10/27/19   Elayne Snare, MD  carbamazepine (CARBATROL) 200 MG 12 hr capsule Take 200 mg by mouth 2 (two) times daily.     [provider]  clobetasol (TEMOVATE) 0.05 % GEL Apply 1 application topically 2 (two) times daily as needed (rash).     [provider]  dapagliflozin propanediol (FARXIGA) 10 MG TABS tablet Take 1 tablet (10 mg total) by mouth daily. Generic only 05/05/20   Elayne Snare, MD  diltiazem (CARDIZEM CD) 240 MG 24 hr capsule Take 1 capsule (240 mg total) by mouth at bedtime. 06/13/20   Croitoru, Mihai, MD  donepezil (ARICEPT) 10 MG tablet Take 1 tablet by mouth at bedtime. 01/22/19   [provider]  esomeprazole (NEXIUM) 40 MG capsule Take 1 capsule (40 mg total) by mouth daily at 12 noon. 06/27/17   Rigoberto Noel, MD  folic acid (FOLVITE) 1 MG tablet Take 1 mg by mouth daily.    [provider]  furosemide (LASIX) 40 MG tablet TAKE 1 TABLET BY MOUTH DAILY 08/05/20   Croitoru, Mihai, MD  gabapentin (NEURONTIN) 300 MG capsule Take 300 mg by mouth daily.    [provider]  glucose blood (ACCU-CHEK GUIDE) test strip Use 1-4 times daily as needed/instructed DX E11.9 10/27/19   Elayne Snare, MD  insulin aspart (NOVOLOG) 100 UNIT/ML injection Use max of 80 units in V-go per day 05/27/20   Elayne Snare, MD  Insulin Aspart  FlexPen 100 UNIT/ML SOPN INJECT 14 TO 16 UNITS BEFORE MEALS AS DIRECTED 08/01/20   Elayne Snare, MD  insulin degludec (TRESIBA FLEXTOUCH) 100 UNIT/ML FlexTouch Pen Inject 32 Units into the skin daily. Takes daily in the AM 05/06/20   Elayne Snare, MD  Insulin Disposable Pump (V-GO 40) KIT USE 1 KIT DAILY AS DIRECTED 05/28/20   Elayne Snare, MD  Insulin Pen Needle (BD PEN NEEDLE NANO U/F) 32G X 4 MM MISC USE 8 PEN NEEDLES PER DAY 09/18/18   Elayne Snare, MD  Insulin Pen Needle (COMFORT EZ PEN NEEDLES) 33G X 5 MM MISC 1 each by Does not apply route daily. Use with Tyler Aas pen 09/23/17  Elayne Snare, MD  Insulin Pen Needle 32G X 5 MM MISC 1 each by Does not apply route daily. Use with Tyler Aas pen 09/27/17   Elayne Snare, MD  isosorbide mononitrate (IMDUR) 30 MG 24 hr tablet Take 1 tablet (30 mg total) by mouth daily. 04/30/20   Croitoru, Mihai, MD  levothyroxine (SYNTHROID, LEVOTHROID) 75 MCG tablet Take 75 mcg by mouth daily before breakfast.    [provider]  liraglutide (VICTOZA) 18 MG/3ML SOPN Inject 1.8 mg daily 05/08/20   Elayne Snare, MD  methotrexate (RHEUMATREX) 2.5 MG tablet Take 7.5 mg by mouth every Monday. For rash    [provider]  metoprolol tartrate (LOPRESSOR) 25 MG tablet Take 25 mg in the morning and 50 mg in the evening. 08/20/20   Croitoru, Mihai, MD  OLANZapine (ZYPREXA) 20 MG tablet Take 20 mg by mouth at bedtime. Patient take 1/2 tablet    [provider]  OLANZapine (ZYPREXA) 7.5 MG tablet Take 35 mg by mouth at bedtime.    [provider]  OLANZapine zydis (ZYPREXA) 10 MG disintegrating tablet Take 10 mg by mouth at bedtime.    [provider]  olmesartan (BENICAR) 5 MG tablet Take 5 mg by mouth daily.    [provider]  ondansetron (ZOFRAN) 8 MG tablet Take 1 tablet (8 mg total) by mouth every 8 (eight) hours as needed for nausea. 05/08/12   Dorie Rank, MD  [facility] DELICA LANCETS 82N MISC USE LANCET TO TEST FIVE TIMES A DAY 05/31/17    Elayne Snare, MD  potassium chloride (KLOR-CON) 10 MEQ tablet Take 1 tablet (10 mEq total) by mouth daily. 06/23/20 09/21/20  Croitoru, Mihai, MD  pravastatin (PRAVACHOL) 40 MG tablet Take 1 tablet (40 mg total) by mouth daily. 06/23/17   Danford, Suann Larry, MD  QUEtiapine (SEROQUEL) 25 MG tablet Take 4 tablets by mouth at bedtime. 12/29/18   [provider]  VIMPAT 50 MG TABS tablet Take 50 mg by mouth 2 (two) times daily. Patient not taking: Reported on 07/10/2020 06/11/18   [provider]    Physical Exam: Vitals:   08/20/20 2244 08/20/20 2303 08/21/20 0034 08/21/20 0203  BP:  113/75 122/63   Pulse:  89 91   Resp:  17 (!) 22   Temp: 98.3 F (36.8 C)     TempSrc: Oral     SpO2:  94% 93%   Weight:    105.2 kg  Height:    5' 3"  (1.6 m)    Physical Exam Constitutional:      General: She is not in acute distress. HENT:     Head: Normocephalic and atraumatic.  Eyes:     Extraocular Movements: Extraocular movements intact.     Conjunctiva/sclera: Conjunctivae normal.  Cardiovascular:     Rate and Rhythm: Normal rate and regular rhythm.     Pulses: Normal pulses.  Pulmonary:     Effort: Pulmonary effort is normal. No respiratory distress.     Breath sounds: No wheezing or rales.  Abdominal:     General: Bowel sounds are normal. There is distension.     Palpations: Abdomen is soft.     Tenderness: There is abdominal tenderness. There is no guarding or rebound.     Comments: Mild generalized tenderness to palpation  Musculoskeletal:     Cervical back: Normal range of motion and neck supple.     Comments: +1 pitting edema bilateral lower extremities (chronic per patient)  Skin:  General: Skin is warm and dry.  Neurological:     General: No focal deficit present.     Mental Status: She is alert.     Cranial Nerves: No cranial nerve deficit.     Sensory: No sensory deficit.     Motor: No weakness.     Comments: AAOx4 Answering questions appropriately      Labs on Admission: I have personally reviewed following labs and imaging studies  CBC: Recent Labs  Lab 08/20/20 2116  WBC 8.6  NEUTROABS 6.0  HGB 13.4  HCT 40.5  MCV 91.6  PLT 161   Basic Metabolic Panel: Recent Labs  Lab 08/20/20 2116  NA 130*  K 3.9  CL 96*  CO2 24  GLUCOSE 209*  BUN 20  CREATININE 1.10*  CALCIUM 8.3*   GFR: Estimated Creatinine Clearance: 48.1 mL/min (A) (by C-G formula based on SCr of 1.1 mg/dL (H)). Liver Function Tests: Recent Labs  Lab 08/20/20 2116  AST 22  ALT 31  ALKPHOS 76  BILITOT 0.6  PROT 6.4*  ALBUMIN 3.4*   Recent Labs  Lab 08/20/20 2116  LIPASE 59*   Recent Labs  Lab 08/20/20 2117  AMMONIA 29   Coagulation Profile: No results for input(s): INR, PROTIME in the last 168 hours. Cardiac Enzymes: No results for input(s): CKTOTAL, CKMB, CKMBINDEX, TROPONINI in the last 168 hours. BNP (last 3 results) No results for input(s): PROBNP in the last 8760 hours. HbA1C: No results for input(s): HGBA1C in the last 72 hours. CBG: No results for input(s): GLUCAP in the last 168 hours. Lipid Profile: No results for input(s): CHOL, HDL, LDLCALC, TRIG, CHOLHDL, LDLDIRECT in the last 72 hours. Thyroid Function Tests: Recent Labs    08/20/20 2117  TSH 1.526   Anemia Panel: No results for input(s): VITAMINB12, FOLATE, FERRITIN, TIBC, IRON, RETICCTPCT in the last 72 hours. Urine analysis:    Component Value Date/Time   COLORURINE YELLOW 08/20/2020 2116   APPEARANCEUR CLEAR 08/20/2020 2116   LABSPEC 1.011 08/20/2020 2116   PHURINE 6.0 08/20/2020 2116   GLUCOSEU >=500 (A) 08/20/2020 2116   HGBUR SMALL (A) 08/20/2020 2116   BILIRUBINUR NEGATIVE 08/20/2020 2116   KETONESUR NEGATIVE 08/20/2020 2116   PROTEINUR NEGATIVE 08/20/2020 2116   UROBILINOGEN 0.2 08/31/2014 2118   NITRITE NEGATIVE 08/20/2020 2116   LEUKOCYTESUR NEGATIVE 08/20/2020 2116    Radiological Exams on Admission: CT ABDOMEN PELVIS WO CONTRAST  Result  Date: 08/20/2020 CLINICAL DATA:  Acute nonlocalized abdominal pain EXAM: CT ABDOMEN AND PELVIS WITHOUT CONTRAST TECHNIQUE: Multidetector CT imaging of the abdomen and pelvis was performed following the standard protocol without IV contrast. COMPARISON:  10/16/2015 FINDINGS: Lower chest: Dependent consolidation within the left lower lobe likely reflects atelectasis. No effusion or pneumothorax. Hepatobiliary: There is diffuse hepatic steatosis. No focal liver abnormality on this unenhanced exam. The gallbladder is surgically absent. Pancreas: Unremarkable. No pancreatic ductal dilatation or surrounding inflammatory changes. Spleen: Normal in size without focal abnormality. Adrenals/Urinary Tract: Chronic thickening of the left adrenal gland consistent with adenoma or hyperplasia. The right adrenals unremarkable. No urinary tract calculi or obstructive uropathy within either kidney. The bladder is unremarkable. Stomach/Bowel: No bowel obstruction or ileus. The appendix is surgically absent. Diverticulosis of the distal colon without diverticulitis. No bowel wall thickening or inflammatory change. Small hiatal hernia. Vascular/Lymphatic: Aortic atherosclerosis. No enlarged abdominal or pelvic lymph nodes. Reproductive: Status post hysterectomy. No adnexal masses. Other: No free fluid or free gas.  No abdominal wall hernia. Musculoskeletal: No acute  or destructive bony lesions. Reconstructed images demonstrate no additional findings. IMPRESSION: 1. Diverticulosis without diverticulitis. 2. Hepatic steatosis. 3. Small hiatal hernia. 4.  Aortic Atherosclerosis (ICD10-I70.0). Electronically Signed   By: Randa Ngo M.D.   On: 08/20/2020 23:37   CT HEAD WO CONTRAST (5MM)  Result Date: 08/20/2020 CLINICAL DATA:  Mental status change, unknown cause EXAM: CT HEAD WITHOUT CONTRAST TECHNIQUE: Contiguous axial images were obtained from the base of the skull through the vertex without intravenous contrast. COMPARISON:  MRI  06/23/2017 FINDINGS: Brain: Small foci of macroscopic fat within the falx are compatible with tiny dermoids measuring up to 9 mm. Relatively mild parenchymal volume loss given the patient's age. Mild periventricular white matter changes are present likely reflecting the sequela of small vessel ischemia. No abnormal intra or extra-axial mass lesion or fluid collection. No abnormal mass effect or midline shift. No evidence of acute intracranial hemorrhage or infarct. Ventricular size is normal. Cerebellum unremarkable. Vascular: Unremarkable Skull: Intact Sinuses/Orbits: Paranasal sinuses are clear. Right scleral banding has been performed. Orbits are otherwise unremarkable. Other: Mastoid air cells and middle ear cavities are clear. IMPRESSION: No acute intracranial abnormality. Mild senescent change. Electronically Signed   By: Fidela Salisbury MD   On: 08/20/2020 23:45   DG Chest Portable 1 View  Result Date: 08/20/2020 CLINICAL DATA:  Dyspnea EXAM: PORTABLE CHEST 1 VIEW COMPARISON:  09/05/2018 FINDINGS: Lung volumes are small and there is resultant vascular crowding at the hila. There is superimposed asymmetric interstitial pulmonary infiltrate within the left mid and lower lung zone, infection versus asymmetric pulmonary edema. No pneumothorax or pleural effusion. Cardiac size within normal limits. No acute bone abnormality. IMPRESSION: Asymmetric left mid and lower lung zone pulmonary infiltrate, possibly infectious in the appropriate clinical setting. Pulmonary hypoinflation. Electronically Signed   By: Fidela Salisbury MD   On: 08/20/2020 21:49    EKG: Independently reviewed.  Sinus rhythm, no significant change since prior tracing.  Assessment/Plan Principal Problem:   CAP (community acquired pneumonia) Active Problems:   Sepsis ([place])   Acute metabolic encephalopathy   Elevated troponin   Abdominal pain   Sepsis secondary to community-acquired pneumonia Tachypneic on arrival.  Labs showing lactic  acidosis.  No tachycardia or hypotension.  No leukocytosis.  COVID and influenza PCR negative. Chest x-ray showing asymmetric left mid and lower lung zone pulmonary infiltrate.  Patient is not hypoxic, currently satting in the mid 90s on room air. -Continue ceftriaxone and azithromycin.  Lactic acidosis improved after IV fluid bolus.  Continue IV fluid hydration and trend lactate.  Blood culture x2 drawn and results pending.  Continuous pulse ox, supplemental oxygen if needed.  Acute metabolic encephalopathy (resolved) Likely due to pneumonia.  Patient is currently AAO x4 and answering questions appropriately.  Ammonia level normal.  TSH normal.  Blood ethanol level undetectable.  UA without signs of infection.  Head CT negative for acute finding.  No focal neurodeficit on exam. -Continue to monitor  Mild troponin elevation Likely due to demand ischemia in the setting of pneumonia. High sensitive troponin mildly elevated but stable 30>25.  EKG without acute ischemic changes.  Patient is not endorsing chest pain. -Cardiac monitoring  Generalized abdominal pain, nausea, diarrhea CT negative for acute finding.  No leukocytosis.  Patient denies recent antibiotic use. -Antiemetic as needed.  GI pathogen panel, enteric precautions.  Mild hyponatremia -IV fluid hydration, continue to monitor  Type 2 diabetes Home regimen includes V-Go pump, Tresiba, Farxiga, and Victoza.  Pharmacy dose verification pending.  A1c 7.8 on 07/04/2020.  Continue CBG checks every 4 hours.  Hypertension Currently normotensive. -Resume home meds after pharmacy med rec is done.  Hypothyroidism -Resume Synthroid after pharmacy med rec is done.  OSA -Continue nightly CPAP  Mood disorder -Resume home meds after pharmacy med rec is done.  DVT prophylaxis: Lovenox Code Status: DNR-confirmed with the patient. Family Communication: No family available at this time. Disposition Plan: Status is: Inpatient  Remains  inpatient appropriate because:Inpatient level of care appropriate due to severity of illness  Dispo: The patient is from: Home              Anticipated d/c is to: Home              Patient currently is not medically stable to d/c.   Difficult to place patient No  Level of care: Level of care: Telemetry Medical  The medical decision making on this patient was of high complexity and the patient is at high risk for clinical deterioration, therefore this is a level 3 visit.  Shela Leff MD [facility]  If 7PM-7AM, please contact night-coverage [email or web address]  08/21/2020, 3:40 AM

## 2020-08-21 NOTE — Progress Notes (Signed)
Placed patient on CPAP for the night via auto-mode with oxygen set at 2lpm  

## 2020-08-21 NOTE — Progress Notes (Signed)
   08/21/20 1848  Assess: MEWS Score  Temp 98 F (36.7 C)  BP 138/87  Assess: MEWS Score  MEWS Temp 0  MEWS Systolic 0  MEWS Pulse 2  MEWS RR 0  MEWS LOC 0  MEWS Score 2  MEWS Score Color Yellow  Assess: if the MEWS score is Yellow or Red  Were vital signs taken at a resting state? Yes  Focused Assessment No change from prior assessment  Early Detection of Sepsis Score *See Row Information* Low  MEWS guidelines implemented *See Row Information* Yes  Treat  MEWS Interventions Administered scheduled meds/treatments  Pain Scale 0-10  Pain Score 6  Pain Type Acute pain  Pain Location Abdomen  Pain Descriptors / Indicators Aching  Pain Frequency Intermittent  Pain Intervention(s) Medication (See eMAR)  Take Vital Signs  Increase Vital Sign Frequency  Yellow: Q 2hr X 2 then Q 4hr X 2, if remains yellow, continue Q 4hrs  Escalate  MEWS: Escalate Yellow: discuss with charge nurse/RN and consider discussing with provider and RRT  Notify: Charge Nurse/RN  Name of Charge Nurse/RN Notified erin rowlett  Date Charge Nurse/RN Notified 08/21/20  Time Charge Nurse/RN Notified 1851  Document  Patient Outcome Stabilized after interventions (remains stable)  Progress note created (see row info) Yes

## 2020-08-21 NOTE — Progress Notes (Addendum)
Patient admitted to [phone]. Son Tawanna Cooler) at bedside. On 2L O2. A&O x3 - unable to tell me why she is here at the hospital. Skin intact with some abrasions in a scratching pattern on R calf. Placed on tele and purwick. Complaining of intermittent pain 6/10 that is worse after eating. Stomach taught and round.  Have placed working phone and call bell in reach of patient and educated her on fall risk precautions.

## 2020-08-21 NOTE — ED Notes (Signed)
Pt lying semi fowlers in bed, tachypnic with long expiratory phase. Mild distress noted. Pt repositioned to 70 degree angle, O2 2LPM [place] applied. +improvement. Pt a/ox3 currently, cannot tell why she is here. Per son, pt found to be ALOC. Pt obeys commands, LS clear/dim in bases.

## 2020-08-21 NOTE — Progress Notes (Signed)
PROGRESS NOTE                                                                                                                                                                                                             Patient Demographics:    [name], is a 80 y.o. female, DOB - [birth year], DJM:[id number]  Outpatient Primary MD for the patient is Georgianne Fick, MD    LOS - 0  Admit date - 08/20/2020    Chief Complaint  Patient presents with   Altered Mental Status       Brief Narrative (HPI from H&P) - [name] is a 80 y.o. female with medical history significant of insulin-dependent type 2 diabetes, obesity (BMI 41.10), history of TIA, history of pericarditis, hypertension, hyperlipidemia, OSA on CPAP, hypothyroidism, depression, bipolar disorder was brought in by EMS for some confusion along with generalized abdominal pain and shortness of breath.  In the ER she was found to have possible pneumonia and fluid overload and admitted.   Subjective:    [name] today has, No headache, No chest pain,  mild generalized abdominal pain, does have some orthopnea and ongoing edema in her legs.   Assessment  & Plan :    Principal Problem:   CAP (community acquired pneumonia) Active Problems:   Sepsis (HCC)   Acute metabolic encephalopathy   Elevated troponin   Abdominal pain   Subacute generalized abdominal pain along with orthopnea, shortness of breath and massive fluid overload - her abdominal exam is benign and CT scan abdomen pelvis with oral and IV contrast is nonacute except for fatty liver, she does have diverticulosis and some constipation.  She will be placed on bowel regimen, clear liquid diet, supportive care, this could be all due to massive fluid overload and she will be aggressively diuresed.  Monitor closely.  2.  Acute on chronic diastolic CHF recent EF around 22%.  Present echo  pending, diuresis, fluid restriction and monitor mild elevation of troponin due to CHF decompensation.  No chest pain.  EKG nonacute.  Monitor.  3.  History of TIA.  On aspirin and statin for secondary prevention.  4.  Hypertension.  On calcium channel blocker.  Continue.  5.  Dyslipidemia.  On statin.  6. Morbid Obesity.  BMI of 41.  Follow with PCP for weight  loss. CPAP nightly for OSA.  7.  Questionable pneumonia.  Clinically doubt she has pneumonia, continue antibiotics for another 1 to 2 days and monitor clinically, will give short course of antibiotic if needed.    8. DM type II.  Placed on Lantus and sliding scale monitor  Lab Results  Component Value Date   HGBA1C 7.8 (H) 07/04/2020   CBG (last 3)  Recent Labs    08/21/20 0427 08/21/20 0812 08/21/20 1216  GLUCAP 132* 199* 144*         Condition - Fair  Family Communication  :  None  Code Status :  Full  Consults  :  None  PUD Prophylaxis : PPI   Procedures  :     CT scan abdomen pelvis with oral IV contrast.  Fatty liver and diverticulosis.  Echocardiogram.      Disposition Plan  :    Status is: Inpatient  Remains inpatient appropriate because:IV treatments appropriate due to intensity of illness or inability to take PO  Dispo: The patient is from: Home              Anticipated d/c is to: Home              Patient currently is not medically stable to d/c.   Difficult to place patient No   DVT Prophylaxis  :    Place TED hose Start: 08/21/20 0825 enoxaparin (LOVENOX) injection 40 mg Start: 08/21/20 0600  Lab Results  Component Value Date   PLT 261 08/20/2020    Diet :  Diet Order             Diet clear liquid Room service appropriate? Yes; Fluid consistency: Thin  Diet effective now                    Inpatient Medications  Scheduled Meds:  aspirin EC  81 mg Oral Daily   carbamazepine  200 mg Oral BID   diltiazem  120 mg Oral QHS   enoxaparin (LOVENOX) injection  40 mg  Subcutaneous Q24H   folic acid  1 mg Oral Daily   furosemide  40 mg Intravenous Once   furosemide  40 mg Intravenous Once   insulin aspart  0-15 Units Subcutaneous TID WC   insulin aspart  0-5 Units Subcutaneous QHS   insulin glargine-yfgn  15 Units Subcutaneous QHS   isosorbide mononitrate  30 mg Oral Daily   [START ON 08/22/2020] levothyroxine  75 mcg Oral QAC breakfast   pantoprazole  40 mg Oral Daily   pravastatin  20 mg Oral Daily   QUEtiapine  100 mg Oral QHS   Continuous Infusions:  azithromycin (ZITHROMAX) 500 MG IVPB (Vial-Mate [employer])     cefTRIAXone (ROCEPHIN)  IV     PRN Meds:.acetaminophen **OR** [DISCONTINUED] acetaminophen, ondansetron (ZOFRAN) IV, traMADol  Antibiotics  :    Anti-infectives (From admission, onward)    Start     Dose/Rate Route Frequency Ordered Stop   08/21/20 2230  azithromycin (ZITHROMAX) 500 mg in sodium chloride 0.9 % 250 mL IVPB        500 mg 250 mL/hr over 60 Minutes Intravenous Every 24 hours 08/21/20 0259     08/21/20 2230  cefTRIAXone (ROCEPHIN) 1 g in sodium chloride 0.9 % 100 mL IVPB        1 g 200 mL/hr over 30 Minutes Intravenous Every 24 hours 08/21/20 0259     08/20/20 2230  cefTRIAXone (ROCEPHIN) 1 g in  sodium chloride 0.9 % 100 mL IVPB        1 g 200 mL/hr over 30 Minutes Intravenous  Once 08/20/20 2228 08/20/20 2345   08/20/20 2230  azithromycin (ZITHROMAX) 500 mg in sodium chloride 0.9 % 250 mL IVPB        500 mg 250 mL/hr over 60 Minutes Intravenous  Once 08/20/20 2228 08/20/20 2359        Time Spent in minutes  30   Susa RaringPrashant Shaniyah Wix M.D on 08/21/2020 at 12:15 PM  To page go to [email or web address]   [facility] -  Office  [phone]    See all Orders from today for further details    Objective:   Vitals:   08/21/20 0824 08/21/20 0900 08/21/20 1000 08/21/20 1100  BP: 127/62 128/74 124/63 120/66  Pulse: (!) 110 (!) 108 (!) 107 (!) 102  Resp: (!) 26 (!) 23 (!) 22 (!) 22  Temp: 99.1 F (37.3 C)     TempSrc:  Oral     SpO2: 93% 93% 92% 92%  Weight:      Height:        Wt Readings from Last 3 Encounters:  08/21/20 105.2 kg  07/10/20 108.6 kg  06/13/20 107 kg     Intake/Output Summary (Last 24 hours) at 08/21/2020 1215 Last data filed at 08/21/2020 0857 Gross per 24 hour  Intake 1292.3 ml  Output --  Net 1292.3 ml     Physical Exam  Awake Alert, No new F.N deficits, Normal affect [place].AT,PERRAL Supple Neck,No JVD, No cervical lymphadenopathy appriciated.  Symmetrical Chest wall movement, Good air movement bilaterally, CTAB RRR,No Gallops,Rubs or new Murmurs, No Parasternal Heave +ve B.Sounds, Abd distended but no focal tenderness, 2+ leg edema   Data Review:    CBC Recent Labs  Lab 08/20/20 2116  WBC 8.6  HGB 13.4  HCT 40.5  PLT 261  MCV 91.6  MCH 30.3  MCHC 33.1  RDW 14.6  LYMPHSABS 1.2  MONOABS 1.1*  EOSABS 0.2  BASOSABS 0.0    Recent Labs  Lab 08/20/20 2116 08/20/20 2117 08/20/20 2118 08/20/20 2316 08/21/20 0344  NA 130*  --   --   --  139  K 3.9  --   --   --  4.2  CL 96*  --   --   --  104  CO2 24  --   --   --  26  GLUCOSE 209*  --   --   --  141*  BUN 20  --   --   --  17  CREATININE 1.10*  --   --   --  1.01*  CALCIUM 8.3*  --   --   --  8.2*  AST 22  --   --   --   --   ALT 31  --   --   --   --   ALKPHOS 76  --   --   --   --   BILITOT 0.6  --   --   --   --   ALBUMIN 3.4*  --   --   --   --   LATICACIDVEN 3.1*  --   --  2.3* 1.9  TSH  --  1.526  --   --   --   AMMONIA  --  29  --   --   --   BNP  --   --  186.8*  --   --     ------------------------------------------------------------------------------------------------------------------  No results for input(s): CHOL, HDL, LDLCALC, TRIG, CHOLHDL, LDLDIRECT in the last 72 hours.  Lab Results  Component Value Date   HGBA1C 7.8 (H) 07/04/2020   ------------------------------------------------------------------------------------------------------------------ Recent Labs     08/20/20 2117  TSH 1.526    Cardiac Enzymes No results for input(s): CKMB, TROPONINI, MYOGLOBIN in the last 168 hours.  Invalid input(s): CK ------------------------------------------------------------------------------------------------------------------    Component Value Date/Time   BNP 186.8 (H) 08/20/2020 2118    Radiology Reports CT ABDOMEN PELVIS WO CONTRAST  Result Date: 08/20/2020 CLINICAL DATA:  Acute nonlocalized abdominal pain EXAM: CT ABDOMEN AND PELVIS WITHOUT CONTRAST TECHNIQUE: Multidetector CT imaging of the abdomen and pelvis was performed following the standard protocol without IV contrast. COMPARISON:  10/16/2015 FINDINGS: Lower chest: Dependent consolidation within the left lower lobe likely reflects atelectasis. No effusion or pneumothorax. Hepatobiliary: There is diffuse hepatic steatosis. No focal liver abnormality on this unenhanced exam. The gallbladder is surgically absent. Pancreas: Unremarkable. No pancreatic ductal dilatation or surrounding inflammatory changes. Spleen: Normal in size without focal abnormality. Adrenals/Urinary Tract: Chronic thickening of the left adrenal gland consistent with adenoma or hyperplasia. The right adrenals unremarkable. No urinary tract calculi or obstructive uropathy within either kidney. The bladder is unremarkable. Stomach/Bowel: No bowel obstruction or ileus. The appendix is surgically absent. Diverticulosis of the distal colon without diverticulitis. No bowel wall thickening or inflammatory change. Small hiatal hernia. Vascular/Lymphatic: Aortic atherosclerosis. No enlarged abdominal or pelvic lymph nodes. Reproductive: Status post hysterectomy. No adnexal masses. Other: No free fluid or free gas.  No abdominal wall hernia. Musculoskeletal: No acute or destructive bony lesions. Reconstructed images demonstrate no additional findings. IMPRESSION: 1. Diverticulosis without diverticulitis. 2. Hepatic steatosis. 3. Small hiatal hernia. 4.   Aortic Atherosclerosis (ICD10-I70.0). Electronically Signed   By: Sharlet Salina M.D.   On: 08/20/2020 23:37   DG Abd 1 View  Result Date: 08/21/2020 CLINICAL DATA:  Abdominal pain EXAM: ABDOMEN - 1 VIEW COMPARISON:  10/01/2019 FINDINGS: The bowel gas pattern is normal. No radio-opaque calculi or other significant radiographic abnormality are seen. Surgical clips right upper quadrant IMPRESSION: Negative. Electronically Signed   By: Marlan Palau M.D.   On: 08/21/2020 08:29   CT HEAD WO CONTRAST ( )  Result Date: 08/20/2020 CLINICAL DATA:  Mental status change, unknown cause EXAM: CT HEAD WITHOUT CONTRAST TECHNIQUE: Contiguous axial images were obtained from the base of the skull through the vertex without intravenous contrast. COMPARISON:  MRI 06/23/2017 FINDINGS: Brain: Small foci of macroscopic fat within the falx are compatible with tiny dermoids measuring up to 9 mm. Relatively mild parenchymal volume loss given the patient's age. Mild periventricular white matter changes are present likely reflecting the sequela of small vessel ischemia. No abnormal intra or extra-axial mass lesion or fluid collection. No abnormal mass effect or midline shift. No evidence of acute intracranial hemorrhage or infarct. Ventricular size is normal. Cerebellum unremarkable. Vascular: Unremarkable Skull: Intact Sinuses/Orbits: Paranasal sinuses are clear. Right scleral banding has been performed. Orbits are otherwise unremarkable. Other: Mastoid air cells and middle ear cavities are clear. IMPRESSION: No acute intracranial abnormality. Mild senescent change. Electronically Signed   By: Helyn Numbers MD   On: 08/20/2020 23:45   CT ABDOMEN PELVIS W CONTRAST  Result Date: 08/21/2020 CLINICAL DATA:  Abdominal pain, fever EXAM: CT ABDOMEN AND PELVIS WITH CONTRAST TECHNIQUE: Multidetector CT imaging of the abdomen and pelvis was performed using the standard protocol following bolus administration of intravenous contrast.  CONTRAST:  80mL OMNIPAQUE IOHEXOL 350 MG/ML SOLN  COMPARISON:  08/20/2020 FINDINGS: Lower chest: Bibasilar atelectasis or scarring. Heart is normal size. Hepatobiliary: Prior cholecystectomy. Diffuse fatty infiltration of the liver. No focal hepatic abnormality. Pancreas: No focal abnormality or ductal dilatation. Spleen: No focal abnormality.  Normal size. Adrenals/Urinary Tract: No adrenal abnormality. No focal renal abnormality. No stones or hydronephrosis. Urinary bladder is unremarkable. Stomach/Bowel: Sigmoid diverticulosis. No active diverticulitis. Stomach and small bowel decompressed, unremarkable. Vascular/Lymphatic: Scattered aortic atherosclerosis. No evidence of aneurysm or adenopathy. Reproductive: Prior hysterectomy.  No adnexal masses. Other: No free fluid or free air. Musculoskeletal: No acute bony abnormality. IMPRESSION: Hepatic steatosis. Bibasilar atelectasis or scarring, stable. Sigmoid diverticulosis.  No active diverticulitis. Aortic atherosclerosis. No acute findings. Electronically Signed   By: Charlett Nose M.D.   On: 08/21/2020 09:16   DG Chest Port 1 View  Result Date: 08/21/2020 CLINICAL DATA:  Short of breath, abdominal pain EXAM: PORTABLE CHEST 1 VIEW COMPARISON:  08/20/2020 FINDINGS: Hypoventilation with mild atelectasis in the lung bases. Left lower lobe airspace disease unchanged. Cardiac enlargement with mild vascular congestion. Negative for edema or effusion. IMPRESSION: Hypoventilation with bibasilar atelectasis. Mild left lower lobe airspace disease unchanged Mild vascular congestion without edema. Electronically Signed   By: Marlan Palau M.D.   On: 08/21/2020 08:18   DG Chest Portable 1 View  Result Date: 08/20/2020 CLINICAL DATA:  Dyspnea EXAM: PORTABLE CHEST 1 VIEW COMPARISON:  09/05/2018 FINDINGS: Lung volumes are small and there is resultant vascular crowding at the hila. There is superimposed asymmetric interstitial pulmonary infiltrate within the left mid and  lower lung zone, infection versus asymmetric pulmonary edema. No pneumothorax or pleural effusion. Cardiac size within normal limits. No acute bone abnormality. IMPRESSION: Asymmetric left mid and lower lung zone pulmonary infiltrate, possibly infectious in the appropriate clinical setting. Pulmonary hypoinflation. Electronically Signed   By: Helyn Numbers MD   On: 08/20/2020 21:49

## 2020-08-21 NOTE — ED Notes (Signed)
[name] [phone] other son's contact info

## 2020-08-21 NOTE — ED Notes (Signed)
Pt is asking for more pain meds. PRN Tylenol given with no improvement. Admitting team paged at this time. Waiting for new orders.

## 2020-08-22 ENCOUNTER — Inpatient Hospital Stay (HOSPITAL_COMMUNITY): Payer: Medicare PPO

## 2020-08-22 DIAGNOSIS — J189 Pneumonia, unspecified organism: Secondary | ICD-10-CM | POA: Diagnosis not present

## 2020-08-22 DIAGNOSIS — R410 Disorientation, unspecified: Secondary | ICD-10-CM | POA: Diagnosis not present

## 2020-08-22 DIAGNOSIS — G9341 Metabolic encephalopathy: Secondary | ICD-10-CM | POA: Diagnosis not present

## 2020-08-22 LAB — CBC WITH DIFFERENTIAL/PLATELET
Abs Immature Granulocytes: 0.03 10*3/uL (ref 0.00–0.07)
Basophils Absolute: 0 10*3/uL (ref 0.0–0.1)
Basophils Relative: 0 %
Eosinophils Absolute: 0.1 10*3/uL (ref 0.0–0.5)
Eosinophils Relative: 1 %
HCT: 38.6 % (ref 36.0–46.0)
Hemoglobin: 12.6 g/dL (ref 12.0–15.0)
Immature Granulocytes: 0 %
Lymphocytes Relative: 22 %
Lymphs Abs: 1.6 10*3/uL (ref 0.7–4.0)
MCH: 29.9 pg (ref 26.0–34.0)
MCHC: 32.6 g/dL (ref 30.0–36.0)
MCV: 91.7 fL (ref 80.0–100.0)
Monocytes Absolute: 1.3 10*3/uL — ABNORMAL HIGH (ref 0.1–1.0)
Monocytes Relative: 17 %
Neutro Abs: 4.4 10*3/uL (ref 1.7–7.7)
Neutrophils Relative %: 60 %
Platelets: 215 10*3/uL (ref 150–400)
RBC: 4.21 MIL/uL (ref 3.87–5.11)
RDW: 14.6 % (ref 11.5–15.5)
WBC: 7.4 10*3/uL (ref 4.0–10.5)
nRBC: 0 % (ref 0.0–0.2)

## 2020-08-22 LAB — COMPREHENSIVE METABOLIC PANEL
ALT: 25 U/L (ref 0–44)
AST: 19 U/L (ref 15–41)
Albumin: 3 g/dL — ABNORMAL LOW (ref 3.5–5.0)
Alkaline Phosphatase: 69 U/L (ref 38–126)
Anion gap: 8 (ref 5–15)
BUN: 13 mg/dL (ref 8–23)
CO2: 31 mmol/L (ref 22–32)
Calcium: 8.2 mg/dL — ABNORMAL LOW (ref 8.9–10.3)
Chloride: 100 mmol/L (ref 98–111)
Creatinine, Ser: 0.96 mg/dL (ref 0.44–1.00)
GFR, Estimated: >60 mL/min (ref >60)
Glucose, Bld: 148 mg/dL — ABNORMAL HIGH (ref 70–99)
Potassium: 3.5 mmol/L (ref 3.5–5.1)
Sodium: 139 mmol/L (ref 135–145)
Total Bilirubin: 0.4 mg/dL (ref 0.3–1.2)
Total Protein: 6.4 g/dL — ABNORMAL LOW (ref 6.5–8.1)

## 2020-08-22 LAB — LIPASE, BLOOD: Lipase: 33 U/L (ref 11–51)

## 2020-08-22 LAB — MAGNESIUM: Magnesium: 2.1 mg/dL (ref 1.7–2.4)

## 2020-08-22 LAB — GLUCOSE, CAPILLARY
Glucose-Capillary: 157 mg/dL — ABNORMAL HIGH (ref 70–99)
Glucose-Capillary: 157 mg/dL — ABNORMAL HIGH (ref 70–99)
Glucose-Capillary: 176 mg/dL — ABNORMAL HIGH (ref 70–99)
Glucose-Capillary: 178 mg/dL — ABNORMAL HIGH (ref 70–99)
Glucose-Capillary: 180 mg/dL — ABNORMAL HIGH (ref 70–99)
Glucose-Capillary: 191 mg/dL — ABNORMAL HIGH (ref 70–99)

## 2020-08-22 LAB — BRAIN NATRIURETIC PEPTIDE: B Natriuretic Peptide: 134.7 pg/mL — ABNORMAL HIGH (ref 0.0–100.0)

## 2020-08-22 LAB — C-REACTIVE PROTEIN: CRP: 20.2 mg/dL — ABNORMAL HIGH (ref <1.0)

## 2020-08-22 LAB — PROCALCITONIN: Procalcitonin: 0.11 ng/mL

## 2020-08-22 LAB — URINE CULTURE

## 2020-08-22 MED ORDER — POTASSIUM CHLORIDE CRYS ER 20 MEQ PO TBCR
40.0000 meq | EXTENDED_RELEASE_TABLET | Freq: Once | ORAL | Status: AC
  Administered 2020-08-22: 40 meq via ORAL
  Filled 2020-08-22: qty 2

## 2020-08-22 MED ORDER — FUROSEMIDE 10 MG/ML IJ SOLN
40.0000 mg | Freq: Two times a day (BID) | INTRAMUSCULAR | Status: AC
  Administered 2020-08-22 – 2020-08-23 (×2): 40 mg via INTRAVENOUS
  Filled 2020-08-22 (×2): qty 4

## 2020-08-22 MED ORDER — DOCUSATE SODIUM 100 MG PO CAPS
100.0000 mg | ORAL_CAPSULE | Freq: Two times a day (BID) | ORAL | Status: DC
  Administered 2020-08-22 – 2020-08-23 (×4): 100 mg via ORAL
  Filled 2020-08-22 (×6): qty 1

## 2020-08-22 MED ORDER — SPIRONOLACTONE 25 MG PO TABS
25.0000 mg | ORAL_TABLET | Freq: Every day | ORAL | Status: DC
  Administered 2020-08-22 – 2020-08-24 (×3): 25 mg via ORAL
  Filled 2020-08-22 (×3): qty 1

## 2020-08-22 NOTE — Progress Notes (Signed)
Pt placed on CPap for the night with previous settings. Tol well

## 2020-08-22 NOTE — TOC Initial Note (Signed)
Transition of Care [facility]) - Initial/Assessment Note    Patient Details  Name: [name] MRN: [id number] Date of Birth: 1940/02/06  [facility] CM/SW Contact:    Kermit Balo, RN Phone Number: 08/22/2020, 1:44 PM  Clinical Narrative:                 Pt lives alone. She states she has neighbors that can check in on her and family that will call her.  Recommendations for outpatient therapy. Pt asked to attend [facility] as she lives closest to this location. Pt drives self for appts.  Recommendations for walker and 3 in 1. Pt has all needed DME at home. Pt will have transport home when ready.  ToC following.  Expected Discharge Plan: OP Rehab Barriers to Discharge: Continued Medical Work up   Patient Goals and CMS Choice     Choice offered to / list presented to : Patient  Expected Discharge Plan and Services Expected Discharge Plan: OP Rehab   Discharge Planning Services: CM Consult                                          Prior Living Arrangements/Services   Lives with:: Self Patient language and need for interpreter reviewed:: Yes Do you feel safe going back to the place where you live?: Yes        Care giver support system in place?: No (comment) Current home services: DME (walker/ cane/ wheelchair/ shower seat/ CPAP) Criminal Activity/Legal Involvement Pertinent to Current Situation/Hospitalization: No - Comment as needed  Activities of Daily Living Home Assistive Devices/Equipment: Insulin Pump, Grab bars around toilet, Grab bars in shower ADL Screening (condition at time of admission) Patient's cognitive ability adequate to safely complete daily activities?: Yes Is the patient deaf or have difficulty hearing?: No Does the patient have difficulty seeing, even when wearing glasses/contacts?: No Does the patient have difficulty concentrating, remembering, or making decisions?: No Patient able to express need for assistance with  ADLs?: Yes Does the patient have difficulty dressing or bathing?: Yes Independently performs ADLs?: No Communication: Independent Dressing (OT): Needs assistance Is this a change from baseline?: Change from baseline, expected to last >3 days Grooming: Needs assistance Is this a change from baseline?: Change from baseline, expected to last >3 days Feeding: Independent Bathing: Needs assistance Is this a change from baseline?: Change from baseline, expected to last >3 days Toileting: Needs assistance Is this a change from baseline?: Change from baseline, expected to last >3days In/Out Bed: Needs assistance Is this a change from baseline?: Change from baseline, expected to last >3 days Walks in Home: Needs assistance Is this a change from baseline?: Change from baseline, expected to last >3 days Does the patient have difficulty walking or climbing stairs?: Yes Weakness of Legs: Both Weakness of Arms/Hands: Both  Permission Sought/Granted                  Emotional Assessment Appearance:: Appears stated age Attitude/Demeanor/Rapport: Engaged Affect (typically observed): Accepting Orientation: : Oriented to Self, Oriented to Place, Oriented to  Time, Oriented to Situation   Psych Involvement: No (comment)  Admission diagnosis:  Confusion [R41.0] Hyponatremia [E87.1] SOB (shortness of breath) [R06.02] Nausea & vomiting [R11.2] CAP (community acquired pneumonia) [J18.9] Elevated lipase [R74.8] Community acquired pneumonia, unspecified laterality [J18.9] Patient Active Problem List   Diagnosis Date Noted   CAP (community  acquired pneumonia) 08/21/2020   Sepsis (HCC) 08/21/2020   Acute metabolic encephalopathy 08/21/2020   Elevated troponin 08/21/2020   Abdominal pain 08/21/2020   Acute lower UTI 06/22/2017   AKI (acute kidney injury) (HCC) 06/22/2017   Syncope 06/22/2017   Staring episodes 05/21/2017   Hyponatremia 05/21/2017   Bilateral pleural effusion    Pleural  effusion on left    Cough    History of pericarditis 05/06/2017   Pericardial effusion 05/04/2017   OSA (obstructive sleep apnea) 02/18/2017   Dyspnea 01/27/2015   Atypical chest pain 12/24/2014   Accelerating angina (HCC) 12/23/2014   Chest pain at rest 10/30/2013   Exertional dyspnea 10/30/2013   Hyperlipidemia 10/30/2013   Morbid obesity (HCC) 10/30/2013   Acquired autoimmune hypothyroidism 09/02/2013   Type 2 diabetes mellitus without complications (HCC) 06/14/2013   Essential hypertension, benign 06/14/2013   Hyperlipemia 06/14/2013   PCP:  Georgianne Fick, MD Pharmacy:   [facility] [id number] [place], [place] - [address] [place] [address] Phone: [phone] Fax: [phone]     Social Determinants of Health (SDOH) Interventions    Readmission Risk Interventions No flowsheet data found.

## 2020-08-22 NOTE — Evaluation (Signed)
Occupational Therapy Evaluation Patient Details Name: [name] MRN: [id number] DOB: 06/04/1940 Today's Date: 08/22/2020    History of Present Illness 80yo female admitted 08/20/20 with AMS. CTH negative. Found to be in sepsis secondary to CAP. PMH bipolar, HLD, HTN, TIA, DM, vertigo, urinary incontinence   Clinical Impression   Pt was functioning independently, but reports some near falls and reaching for walls when ambulating in her home. She denies any difficulty with balance when walking outdoors. Pt presents with baseline anxiety and unsteady gait which was greatly improved with use of RW. Pt reports struggling with donning compression hose at home, educated son in availability of compression sock donning device. Per son, pt's cognition has largely returned to her baseline. Pt with stable VS during mobility on RA. Will follow acutely.     Follow Up Recommendations  No OT follow up    Equipment Recommendations  None recommended by OT    Recommendations for Other Services       Precautions / Restrictions Precautions Precautions: Fall Restrictions Weight Bearing Restrictions: No      Mobility Bed Mobility Overal bed mobility: Needs Assistance Bed Mobility: Supine to Sit     Supine to sit: Min guard;HOB elevated     General bed mobility comments: increased time and effort, assist only given for line management    Transfers Overall transfer level: Needs assistance Equipment used: Rolling walker (2 wheeled) Transfers: Sit to/from Stand Sit to Stand: Min guard         General transfer comment: cues for hand placement and sequencing, otherwise no physical assist given    Balance Overall balance assessment: Needs assistance Sitting-balance support: Bilateral upper extremity supported;Feet supported Sitting balance-Leahy Scale: Good     Standing balance support: No upper extremity supported Standing balance-Leahy Scale: Fair Standing balance comment: fair  statically, safer with RW                           ADL either performed or assessed with clinical judgement   ADL Overall ADL's : Needs assistance/impaired Eating/Feeding: Independent;Sitting   Grooming: Wash/dry hands;Standing;Min guard   Upper Body Bathing: Set up;Sitting   Lower Body Bathing: Maximal assistance;Sitting/lateral leans Lower Body Bathing Details (indicate cue type and reason): pt typically uses a long handled bath sponge to reach her feet Upper Body Dressing : Set up;Sitting   Lower Body Dressing: Maximal assistance;Sit to/from stand Lower Body Dressing Details (indicate cue type and reason): pt wears compression hose, educated son in availability of donner online as pt struggles with this Toilet Transfer: Min guard;Ambulation;RW;BSC   Toileting- Architect and Hygiene: Min guard;Sit to/from stand       Functional mobility during ADLs: Min guard;Rolling walker       Vision Patient Visual Report: No change from baseline       Perception     Praxis      Pertinent Vitals/Pain Pain Assessment: No/denies pain Faces Pain Scale: No hurt Pain Intervention(s): Limited activity within patient's tolerance;Monitored during session     Hand Dominance Right   Extremity/Trunk Assessment Upper Extremity Assessment Upper Extremity Assessment: Overall WFL for tasks assessed (B tremor at baseline)   Lower Extremity Assessment Lower Extremity Assessment: Defer to PT evaluation   Cervical / Trunk Assessment Cervical / Trunk Assessment: Normal   Communication Communication Communication: No difficulties   Cognition Arousal/Alertness: Awake/alert Behavior During Therapy: Anxious Overall Cognitive Status: Within Functional Limits for tasks assessed  General Comments: son reports pt's cognition is at her baseline   General Comments  VSS on RA    Exercises     Shoulder Instructions       Home Living Family/patient expects to be discharged to:: Private residence Living Arrangements: Alone Available Help at Discharge: Family;Available 24 hours/day Type of Home: Other(Comment) (town home) Home Access: Level entry     Home Layout: One level     Bathroom Shower/Tub: Chief Strategy Officer: Handicapped height     Home Equipment: Grab bars - tub/shower;Shower seat;Cane - quad;Walker - 2 wheels;Wheelchair - manual   Additional Comments: no falls recently, has had some close calls having to brace on walls- tends to furniture walk in the house. Walks in the neighborhood but states she is steady.      Prior Functioning/Environment Level of Independence: Needs assistance  Gait / Transfers Assistance Needed: has groceries delivered due to covid but does go to pharmacy in store ADL's / Homemaking Assistance Needed: does own bills and manages own meds            OT Problem List: Impaired balance (sitting and/or standing);Decreased knowledge of use of DME or AE      OT Treatment/Interventions: Self-care/ADL training;DME and/or AE instruction;Therapeutic activities;Patient/family education;Balance training    OT Goals(Current goals can be found in the care plan section) Acute Rehab OT Goals Patient Stated Goal: go home OT Goal Formulation: With patient Time For Goal Achievement: 09/05/20 Potential to Achieve Goals: Good ADL Goals Pt Will Perform Grooming: with modified independence;standing Pt Will Transfer to Toilet: with modified independence;ambulating;bedside commode (over toilet) Pt Will Perform Toileting - Clothing Manipulation and hygiene: with modified independence;sit to/from stand Additional ADL Goal #1: Pt will participate in formal cognitive testing to assess ability to manage medication.  OT Frequency: Min 2X/week   Barriers to D/C:            Co-evaluation              AM-PAC OT "6 Clicks" Daily Activity     Outcome Measure  Help from another person eating meals?: None Help from another person taking care of personal grooming?: A Little Help from another person toileting, which includes using toliet, bedpan, or urinal?: A Little Help from another person bathing (including washing, rinsing, drying)?: A Lot Help from another person to put on and taking off regular upper body clothing?: A Little Help from another person to put on and taking off regular lower body clothing?: A Lot 6 Click Score: 17   End of Session Equipment Utilized During Treatment: Rolling walker;Gait belt Nurse Communication: Mobility status  Activity Tolerance: Patient tolerated treatment well Patient left: in chair;with call bell/phone within reach  OT Visit Diagnosis: Unsteadiness on feet (R26.81);Other abnormalities of gait and mobility (R26.89)                Time: 3532-9924 OT Time Calculation (min): 33 min Charges:  OT General Charges $OT Visit: 1 Visit OT Evaluation $OT Eval Moderate Complexity: 1 Mod Martie Round, OTR/L Acute Rehabilitation Services Pager: [phone] Office: [phone]  Evern Bio 08/22/2020, 12:03 PM

## 2020-08-22 NOTE — Progress Notes (Signed)
Please see in addition to PT note:   SATURATION QUALIFICATIONS: (This note is used to comply with regulatory documentation for home oxygen)   Patient Saturations on Room Air at Rest = 94%   Patient Saturations on Room Air while Ambulating = 92%   Patient Saturations on 2 Liters of oxygen while Ambulating = DNT    Please briefly explain why patient needs home oxygen: N/A    Lerry Liner PT, DPT, PN2   Supplemental Physical Therapist [facility]    Pager [phone] Acute Rehab Office [phone]

## 2020-08-22 NOTE — Evaluation (Signed)
Physical Therapy Evaluation Patient Details Name: [name] MRN: [id number] DOB: Oct 10, 1940 Today's Date: 08/22/2020   History of Present Illness  80yo female admitted 08/20/20 with AMS. CTH negative. Found to be in sepsis secondary to CAP. PMH bipolar, HLD, HTN, TIA, DM, vertigo, urinary incontinence  Clinical Impression   Patient received in bed, very pleasant and cooperative but anxious- family reports this is baseline. Able to move well today on a min guard level with RW- just very weak and easily fatigued. Motivated to return home with assist of family and friends. VSS on RA. Left up in recliner with family present, all needs otherwise met. Will benefit from OP PT f/u at DC.     Follow Up Recommendations Outpatient PT;Supervision for mobility/OOB    Equipment Recommendations  Rolling walker with 5" wheels;3in1 (PT)    Recommendations for Other Services       Precautions / Restrictions Precautions Precautions: Fall Restrictions Weight Bearing Restrictions: No      Mobility  Bed Mobility Overal bed mobility: Needs Assistance Bed Mobility: Supine to Sit     Supine to sit: Min guard;HOB elevated     General bed mobility comments: increased time and effort, assist only given for line management    Transfers Overall transfer level: Needs assistance Equipment used: Rolling walker (2 wheeled) Transfers: Sit to/from Stand Sit to Stand: Min guard         General transfer comment: cues for hand placement and sequencing, otherwise no physical assist given  Ambulation/Gait Ambulation/Gait assistance: Min guard Gait Distance (Feet): 100 Feet Assistive device: Rolling walker (2 wheeled) Gait Pattern/deviations: Step-through pattern;Decreased step length - right;Decreased step length - left;Trunk flexed Gait velocity: decreased   General Gait Details: slow but steady with RW, VSS on RA. Easily fatigued  Stairs            Wheelchair Mobility    Modified  Rankin (Stroke Patients Only)       Balance Overall balance assessment: Needs assistance Sitting-balance support: Bilateral upper extremity supported;Feet supported Sitting balance-Leahy Scale: Good     Standing balance support: No upper extremity supported;During functional activity Standing balance-Leahy Scale: Poor Standing balance comment: tends to reach for walls and furniture when walking without BUE support                             Pertinent Vitals/Pain Pain Assessment: Faces Faces Pain Scale: No hurt Pain Intervention(s): Limited activity within patient's tolerance;Monitored during session    [place] expects to be discharged to:: Private residence Living Arrangements: Alone Available Help at Discharge: Family;Available 24 hours/day Type of Home: Other(Comment) (townhouse) Home Access: Level entry     Home Layout: One level Home Equipment: Grab bars - tub/shower;Shower seat;Cane - quad;Walker - 2 wheels;Wheelchair - manual Additional Comments: no falls recently, has had some close calls having to brace on walls- tends to furniture walk in the house. Walks in the neighborhood but states she is steady.    Prior Function Level of Independence: Needs assistance   Gait / Transfers Assistance Needed: has groceries delivered due to covid but does go to pharmacy in store  ADL's / Homemaking Assistance Needed: does own bills and manages own meds        Hand Dominance        Extremity/Trunk Assessment   Upper Extremity Assessment Upper Extremity Assessment: Defer to OT evaluation    Lower Extremity Assessment Lower Extremity Assessment: Generalized  weakness    Cervical / Trunk Assessment Cervical / Trunk Assessment: Normal  Communication      Cognition Arousal/Alertness: Awake/alert Behavior During Therapy: WFL for tasks assessed/performed;Anxious Overall Cognitive Status: Within Functional Limits for tasks assessed                                  General Comments: able to answer all A&O questions correctly, son reports cognition has returned to baseline. Very anxious, son reports this is also her baseline.      General Comments General comments (skin integrity, edema, etc.): VSS on RA    Exercises     Assessment/Plan    PT Assessment Patient needs continued PT services  PT Problem List Decreased strength;Decreased cognition;Decreased knowledge of use of DME;Obesity;Decreased activity tolerance;Decreased balance;Decreased mobility       PT Treatment Interventions DME instruction;Balance training;Gait training;Stair training;Cognitive remediation;Functional mobility training;Patient/family education;Therapeutic activities;Therapeutic exercise    PT Goals (Current goals can be found in the Care Plan section)  Acute Rehab PT Goals Patient Stated Goal: go home PT Goal Formulation: With patient/family Time For Goal Achievement: 09/05/20 Potential to Achieve Goals: Good    Frequency Min 3X/week   Barriers to discharge        Co-evaluation               AM-PAC PT "6 Clicks" Mobility  Outcome Measure Help needed turning from your back to your side while in a flat bed without using bedrails?: A Little Help needed moving from lying on your back to sitting on the side of a flat bed without using bedrails?: A Little Help needed moving to and from a bed to a chair (including a wheelchair)?: A Little Help needed standing up from a chair using your arms (e.g., wheelchair or bedside chair)?: A Little Help needed to walk in hospital room?: A Little Help needed climbing 3-5 steps with a railing? : A Lot 6 Click Score: 17    End of Session   Activity Tolerance: Patient tolerated treatment well Patient left: in chair;with call bell/phone within reach;with family/visitor present Nurse Communication: Mobility status PT Visit Diagnosis: Muscle weakness (generalized) (M62.81);Unsteadiness  on feet (R26.81)    Time: 5784-6962 PT Time Calculation (min) (ACUTE ONLY): 33 min   Charges:   PT Evaluation $PT Eval Moderate Complexity: 1 Mod (co-eval with OT)         Ann Lions PT, DPT, PN2   Supplemental Physical Therapist [place]    Pager [phone] Acute Rehab Office [phone]

## 2020-08-22 NOTE — Progress Notes (Signed)
PROGRESS NOTE                                                                                                                                                                                                             Patient Demographics:    [name], is a 80 y.o. female, DOB - 09/29/1940, OZH:[id number]  Outpatient Primary MD for the patient is Georgianne Fick, MD    LOS - 1  Admit date - 08/20/2020    Chief Complaint  Patient presents with   Altered Mental Status       Brief Narrative (HPI from H&P) - [name] is a 80 y.o. female with medical history significant of insulin-dependent type 2 diabetes, obesity (BMI 41.10), history of TIA, history of pericarditis, hypertension, hyperlipidemia, OSA on CPAP, hypothyroidism, depression, bipolar disorder was brought in by EMS for some confusion along with generalized abdominal pain and shortness of breath.  In the ER she was found to have possible pneumonia and fluid overload and admitted.   Subjective:   Patient in bed, appears comfortable, denies any headache, no fever, no chest pain or pressure, no shortness of breath , no abdominal pain. No new focal weakness. No Diarrhea.   Assessment  & Plan :    Subacute generalized abdominal pain along with orthopnea, shortness of breath and massive fluid overload - her abdominal exam is benign and CT scan abdomen pelvis with oral and IV contrast is nonacute except for fatty liver, she does have diverticulosis and some constipation.    Most of her issues seem to be due to fluid overload and responded very well to diuresis with IV Lasix most of her symptoms have resolved, no diarrhea or abdominal pain now, breathing is improved quite a bit continue diuresis with IV Lasix and monitor closely.  2.  Acute on chronic diastolic CHF recent EF around 62%.  Present echo pending, diuresis, fluid restriction and monitor mild  elevation of troponin due to CHF decompensation.  No chest pain.  EKG nonacute.  Monitor.  3.  History of TIA.  On aspirin and statin for secondary prevention.  4.  Hypertension.  On calcium channel blocker.  Continue.  5.  Dyslipidemia.  On statin.  6. Morbid Obesity.  BMI of 41.  Follow with PCP for weight loss. CPAP nightly for OSA.  7.  Questionable pneumonia.  Clinically doubt she has pneumonia, continue antibiotics for another 1 to 2 days and monitor clinically, will give short course of antibiotic if needed, monitoring CRP along with CBC and procalcitonin.  8. Hypokalemia.  Replaced.    9. DM type II.  Placed on Lantus and sliding scale monitor  Lab Results  Component Value Date   HGBA1C 7.8 (H) 07/04/2020   CBG (last 3)  Recent Labs    08/22/20 0058 08/22/20 0523 08/22/20 0755  GLUCAP 180* 157* 157*         Condition - Fair  Family Communication  : None bedside on 08/22/2020  Code Status :  Full  Consults  :  None  PUD Prophylaxis : PPI   Procedures  :     CT scan abdomen pelvis with oral IV contrast.  Fatty liver and diverticulosis.  Echocardiogram. 05/20/20 -  1. Left ventricular ejection fraction, by estimation, is 70 to 75%. The left ventricle has hyperdynamic function. The left ventricle has no regional wall motion abnormalities. There is moderate concentric left ventricular hypertrophy. Left ventricular diastolic parameters are consistent with Grade I diastolic dysfunction  (impaired relaxation).   2. Right ventricular systolic function is normal. The right ventricular size is normal. Tricuspid regurgitation signal is inadequate for assessing PA pressure.   3. The mitral valve is degenerative. Trivial mitral valve regurgitation. No evidence of mitral stenosis.   4. The aortic valve is tricuspid. There is mild calcification of the aortic valve. Aortic valve regurgitation is not visualized. No aortic stenosis is present.       Disposition Plan  :     Status is: Inpatient  Remains inpatient appropriate because:IV treatments appropriate due to intensity of illness or inability to take PO  Dispo: The patient is from: Home              Anticipated d/c is to: Home              Patient currently is not medically stable to d/c.   Difficult to place patient No   DVT Prophylaxis  :    Place TED hose Start: 08/21/20 0825 enoxaparin (LOVENOX) injection 40 mg Start: 08/21/20 0600  Lab Results  Component Value Date   PLT 215 08/22/2020    Diet :  Diet Order             DIET SOFT Room service appropriate? Yes; Fluid consistency: Thin  Diet effective now                    Inpatient Medications  Scheduled Meds:  aspirin EC  81 mg Oral Daily   carbamazepine  200 mg Oral BID   diltiazem  120 mg Oral QHS   docusate sodium  100 mg Oral BID   enoxaparin (LOVENOX) injection  40 mg Subcutaneous Q24H   folic acid  1 mg Oral Daily   furosemide  40 mg Intravenous BID   insulin aspart  0-15 Units Subcutaneous TID WC   insulin aspart  0-5 Units Subcutaneous QHS   insulin glargine-yfgn  15 Units Subcutaneous QHS   isosorbide mononitrate  30 mg Oral Daily   levothyroxine  75 mcg Oral QAC breakfast   pantoprazole  40 mg Oral Daily   potassium chloride  40 mEq Oral Once   pravastatin  20 mg Oral Daily   QUEtiapine  100 mg Oral QHS   spironolactone  25 mg Oral Daily   Continuous Infusions:  azithromycin (  ZITHROMAX) 500 MG IVPB (Vial-Mate [employer]) 500 mg (08/21/20 2334)   cefTRIAXone (ROCEPHIN)  IV 1 g (08/21/20 2047)   PRN Meds:.acetaminophen **OR** [DISCONTINUED] acetaminophen, ondansetron (ZOFRAN) IV, traMADol  Antibiotics  :    Anti-infectives (From admission, onward)    Start     Dose/Rate Route Frequency Ordered Stop   08/21/20 2230  azithromycin (ZITHROMAX) 500 mg in sodium chloride 0.9 % 250 mL IVPB        500 mg 250 mL/hr over 60 Minutes Intravenous Every 24 hours 08/21/20 0259     08/21/20 2230  cefTRIAXone  (ROCEPHIN) 1 g in sodium chloride 0.9 % 100 mL IVPB        1 g 200 mL/hr over 30 Minutes Intravenous Every 24 hours 08/21/20 0259     08/20/20 2230  cefTRIAXone (ROCEPHIN) 1 g in sodium chloride 0.9 % 100 mL IVPB        1 g 200 mL/hr over 30 Minutes Intravenous  Once 08/20/20 2228 08/20/20 2345   08/20/20 2230  azithromycin (ZITHROMAX) 500 mg in sodium chloride 0.9 % 250 mL IVPB        500 mg 250 mL/hr over 60 Minutes Intravenous  Once 08/20/20 2228 08/20/20 2359        Time Spent in minutes  30   Susa Raring M.D on 08/22/2020 at 9:49 AM  To page go to [email or web address]   [facility] -  Office  [phone]    See all Orders from today for further details    Objective:   Vitals:   08/21/20 2343 08/22/20 0048 08/22/20 0458 08/22/20 0752  BP:  125/61 140/67 132/70  Pulse: 77 88 87   Resp: 18 19 20 17   Temp:  98 F (36.7 C) 97.8 F (36.6 C) 98.5 F (36.9 C)  TempSrc:  Axillary Axillary Oral  SpO2: 95% 90% 91%   Weight:      Height:        Wt Readings from Last 3 Encounters:  08/21/20 105.2 kg  07/10/20 108.6 kg  06/13/20 107 kg     Intake/Output Summary (Last 24 hours) at 08/22/2020 0949 Last data filed at 08/22/2020 0800 Gross per 24 hour  Intake --  Output 2300 ml  Net -2300 ml     Physical Exam  Awake Alert, No new F.N deficits, Normal affect [place].AT,PERRAL Supple Neck,No JVD, No cervical lymphadenopathy appriciated.  Symmetrical Chest wall movement, Good air movement bilaterally, CTAB RRR,No Gallops, Rubs or new Murmurs, No Parasternal Heave +ve B.Sounds,  Abd distended but no focal tenderness, 1+ leg edema   Data Review:    CBC Recent Labs  Lab 08/20/20 2116 08/22/20 0205  WBC 8.6 7.4  HGB 13.4 12.6  HCT 40.5 38.6  PLT 261 215  MCV 91.6 91.7  MCH 30.3 29.9  MCHC 33.1 32.6  RDW 14.6 14.6  LYMPHSABS 1.2 1.6  MONOABS 1.1* 1.3*  EOSABS 0.2 0.1  BASOSABS 0.0 0.0    Recent Labs  Lab 08/20/20 2116 08/20/20 2117 08/20/20 2118  08/20/20 2316 08/21/20 0344 08/22/20 0205  NA 130*  --   --   --  139 139  K 3.9  --   --   --  4.2 3.5  CL 96*  --   --   --  104 100  CO2 24  --   --   --  26 31  GLUCOSE 209*  --   --   --  141* 148*  BUN 20  --   --   --  17 13  CREATININE 1.10*  --   --   --  1.01* 0.96  CALCIUM 8.3*  --   --   --  8.2* 8.2*  AST 22  --   --   --   --  19  ALT 31  --   --   --   --  25  ALKPHOS 76  --   --   --   --  69  BILITOT 0.6  --   --   --   --  0.4  ALBUMIN 3.4*  --   --   --   --  3.0*  MG  --   --   --   --   --  2.1  CRP  --   --   --   --   --  20.2*  LATICACIDVEN 3.1*  --   --  2.3* 1.9  --   TSH  --  1.526  --   --   --   --   AMMONIA  --  29  --   --   --   --   BNP  --   --  186.8*  --   --  134.7*    ------------------------------------------------------------------------------------------------------------------ No results for input(s): CHOL, HDL, LDLCALC, TRIG, CHOLHDL, LDLDIRECT in the last 72 hours.  Lab Results  Component Value Date   HGBA1C 7.8 (H) 07/04/2020   ------------------------------------------------------------------------------------------------------------------ Recent Labs    08/20/20 2117  TSH 1.526    Cardiac Enzymes No results for input(s): CKMB, TROPONINI, MYOGLOBIN in the last 168 hours.  Invalid input(s): CK ------------------------------------------------------------------------------------------------------------------    Component Value Date/Time   BNP 134.7 (H) 08/22/2020 0205    Radiology Reports CT ABDOMEN PELVIS WO CONTRAST  Result Date: 08/20/2020 CLINICAL DATA:  Acute nonlocalized abdominal pain EXAM: CT ABDOMEN AND PELVIS WITHOUT CONTRAST TECHNIQUE: Multidetector CT imaging of the abdomen and pelvis was performed following the standard protocol without IV contrast. COMPARISON:  10/16/2015 FINDINGS: Lower chest: Dependent consolidation within the left lower lobe likely reflects atelectasis. No effusion or pneumothorax.  Hepatobiliary: There is diffuse hepatic steatosis. No focal liver abnormality on this unenhanced exam. The gallbladder is surgically absent. Pancreas: Unremarkable. No pancreatic ductal dilatation or surrounding inflammatory changes. Spleen: Normal in size without focal abnormality. Adrenals/Urinary Tract: Chronic thickening of the left adrenal gland consistent with adenoma or hyperplasia. The right adrenals unremarkable. No urinary tract calculi or obstructive uropathy within either kidney. The bladder is unremarkable. Stomach/Bowel: No bowel obstruction or ileus. The appendix is surgically absent. Diverticulosis of the distal colon without diverticulitis. No bowel wall thickening or inflammatory change. Small hiatal hernia. Vascular/Lymphatic: Aortic atherosclerosis. No enlarged abdominal or pelvic lymph nodes. Reproductive: Status post hysterectomy. No adnexal masses. Other: No free fluid or free gas.  No abdominal wall hernia. Musculoskeletal: No acute or destructive bony lesions. Reconstructed images demonstrate no additional findings. IMPRESSION: 1. Diverticulosis without diverticulitis. 2. Hepatic steatosis. 3. Small hiatal hernia. 4.  Aortic Atherosclerosis (ICD10-I70.0). Electronically Signed   By: Sharlet Salina M.D.   On: 08/20/2020 23:37   DG Abd 1 View  Result Date: 08/21/2020 CLINICAL DATA:  Abdominal pain EXAM: ABDOMEN - 1 VIEW COMPARISON:  10/01/2019 FINDINGS: The bowel gas pattern is normal. No radio-opaque calculi or other significant radiographic abnormality are seen. Surgical clips right upper quadrant IMPRESSION: Negative. Electronically Signed   By: Marlan Palau M.D.   On: 08/21/2020 08:29   CT HEAD WO CONTRAST ( )  Result Date: 08/20/2020  CLINICAL DATA:  Mental status change, unknown cause EXAM: CT HEAD WITHOUT CONTRAST TECHNIQUE: Contiguous axial images were obtained from the base of the skull through the vertex without intravenous contrast. COMPARISON:  MRI 06/23/2017 FINDINGS:  Brain: Small foci of macroscopic fat within the falx are compatible with tiny dermoids measuring up to 9 mm. Relatively mild parenchymal volume loss given the patient's age. Mild periventricular white matter changes are present likely reflecting the sequela of small vessel ischemia. No abnormal intra or extra-axial mass lesion or fluid collection. No abnormal mass effect or midline shift. No evidence of acute intracranial hemorrhage or infarct. Ventricular size is normal. Cerebellum unremarkable. Vascular: Unremarkable Skull: Intact Sinuses/Orbits: Paranasal sinuses are clear. Right scleral banding has been performed. Orbits are otherwise unremarkable. Other: Mastoid air cells and middle ear cavities are clear. IMPRESSION: No acute intracranial abnormality. Mild senescent change. Electronically Signed   By: Helyn NumbersAshesh  Parikh MD   On: 08/20/2020 23:45   CT ABDOMEN PELVIS W CONTRAST  Result Date: 08/21/2020 CLINICAL DATA:  Abdominal pain, fever EXAM: CT ABDOMEN AND PELVIS WITH CONTRAST TECHNIQUE: Multidetector CT imaging of the abdomen and pelvis was performed using the standard protocol following bolus administration of intravenous contrast. CONTRAST:  80mL OMNIPAQUE IOHEXOL 350 MG/ML SOLN COMPARISON:  08/20/2020 FINDINGS: Lower chest: Bibasilar atelectasis or scarring. Heart is normal size. Hepatobiliary: Prior cholecystectomy. Diffuse fatty infiltration of the liver. No focal hepatic abnormality. Pancreas: No focal abnormality or ductal dilatation. Spleen: No focal abnormality.  Normal size. Adrenals/Urinary Tract: No adrenal abnormality. No focal renal abnormality. No stones or hydronephrosis. Urinary bladder is unremarkable. Stomach/Bowel: Sigmoid diverticulosis. No active diverticulitis. Stomach and small bowel decompressed, unremarkable. Vascular/Lymphatic: Scattered aortic atherosclerosis. No evidence of aneurysm or adenopathy. Reproductive: Prior hysterectomy.  No adnexal masses. Other: No free fluid or free  air. Musculoskeletal: No acute bony abnormality. IMPRESSION: Hepatic steatosis. Bibasilar atelectasis or scarring, stable. Sigmoid diverticulosis.  No active diverticulitis. Aortic atherosclerosis. No acute findings. Electronically Signed   By: Charlett NoseKevin  Dover M.D.   On: 08/21/2020 09:16   DG Chest Port 1 View  Result Date: 08/22/2020 CLINICAL DATA:  Shortness of breath. EXAM: PORTABLE CHEST 1 VIEW COMPARISON:  08/21/2020 FINDINGS: Heart size and mediastinal contours are unremarkable. No pleural effusion or edema identified. No airspace densities. Visualized osseous structures are unremarkable. IMPRESSION: No acute cardiopulmonary abnormalities. Electronically Signed   By: Signa Kellaylor  Stroud M.D.   On: 08/22/2020 08:38   DG Chest Port 1 View  Result Date: 08/21/2020 CLINICAL DATA:  Short of breath, abdominal pain EXAM: PORTABLE CHEST 1 VIEW COMPARISON:  08/20/2020 FINDINGS: Hypoventilation with mild atelectasis in the lung bases. Left lower lobe airspace disease unchanged. Cardiac enlargement with mild vascular congestion. Negative for edema or effusion. IMPRESSION: Hypoventilation with bibasilar atelectasis. Mild left lower lobe airspace disease unchanged Mild vascular congestion without edema. Electronically Signed   By: Marlan Palauharles  Clark M.D.   On: 08/21/2020 08:18   DG Chest Portable 1 View  Result Date: 08/20/2020 CLINICAL DATA:  Dyspnea EXAM: PORTABLE CHEST 1 VIEW COMPARISON:  09/05/2018 FINDINGS: Lung volumes are small and there is resultant vascular crowding at the hila. There is superimposed asymmetric interstitial pulmonary infiltrate within the left mid and lower lung zone, infection versus asymmetric pulmonary edema. No pneumothorax or pleural effusion. Cardiac size within normal limits. No acute bone abnormality. IMPRESSION: Asymmetric left mid and lower lung zone pulmonary infiltrate, possibly infectious in the appropriate clinical setting. Pulmonary hypoinflation. Electronically Signed   By: Helyn NumbersAshesh   Parikh MD  On: 08/20/2020 21:49

## 2020-08-22 NOTE — Plan of Care (Signed)
  Problem: Education: Goal: Knowledge of General Education information will improve Description Including pain rating scale, medication(s)/side effects and non-pharmacologic comfort measures Outcome: Progressing   

## 2020-08-23 DIAGNOSIS — R410 Disorientation, unspecified: Secondary | ICD-10-CM | POA: Diagnosis not present

## 2020-08-23 DIAGNOSIS — G9341 Metabolic encephalopathy: Secondary | ICD-10-CM | POA: Diagnosis not present

## 2020-08-23 DIAGNOSIS — J189 Pneumonia, unspecified organism: Secondary | ICD-10-CM | POA: Diagnosis not present

## 2020-08-23 LAB — COMPREHENSIVE METABOLIC PANEL
ALT: 25 U/L (ref 0–44)
AST: 28 U/L (ref 15–41)
Albumin: 3 g/dL — ABNORMAL LOW (ref 3.5–5.0)
Alkaline Phosphatase: 70 U/L (ref 38–126)
Anion gap: 9 (ref 5–15)
BUN: 13 mg/dL (ref 8–23)
CO2: 27 mmol/L (ref 22–32)
Calcium: 8.8 mg/dL — ABNORMAL LOW (ref 8.9–10.3)
Chloride: 100 mmol/L (ref 98–111)
Creatinine, Ser: 0.88 mg/dL (ref 0.44–1.00)
GFR, Estimated: >60 mL/min (ref >60)
Glucose, Bld: 157 mg/dL — ABNORMAL HIGH (ref 70–99)
Potassium: 3.9 mmol/L (ref 3.5–5.1)
Sodium: 136 mmol/L (ref 135–145)
Total Bilirubin: 0.5 mg/dL (ref 0.3–1.2)
Total Protein: 6.4 g/dL — ABNORMAL LOW (ref 6.5–8.1)

## 2020-08-23 LAB — CBC WITH DIFFERENTIAL/PLATELET
Abs Immature Granulocytes: 0.02 10*3/uL (ref 0.00–0.07)
Basophils Absolute: 0 10*3/uL (ref 0.0–0.1)
Basophils Relative: 1 %
Eosinophils Absolute: 0.2 10*3/uL (ref 0.0–0.5)
Eosinophils Relative: 4 %
HCT: 39.4 % (ref 36.0–46.0)
Hemoglobin: 13.1 g/dL (ref 12.0–15.0)
Immature Granulocytes: 0 %
Lymphocytes Relative: 23 %
Lymphs Abs: 1.4 10*3/uL (ref 0.7–4.0)
MCH: 30.1 pg (ref 26.0–34.0)
MCHC: 33.2 g/dL (ref 30.0–36.0)
MCV: 90.6 fL (ref 80.0–100.0)
Monocytes Absolute: 1.1 10*3/uL — ABNORMAL HIGH (ref 0.1–1.0)
Monocytes Relative: 19 %
Neutro Abs: 3.2 10*3/uL (ref 1.7–7.7)
Neutrophils Relative %: 53 %
Platelets: 211 10*3/uL (ref 150–400)
RBC: 4.35 MIL/uL (ref 3.87–5.11)
RDW: 14.4 % (ref 11.5–15.5)
WBC: 6 10*3/uL (ref 4.0–10.5)
nRBC: 0 % (ref 0.0–0.2)

## 2020-08-23 LAB — PROCALCITONIN: Procalcitonin: 0.11 ng/mL

## 2020-08-23 LAB — GLUCOSE, CAPILLARY
Glucose-Capillary: 154 mg/dL — ABNORMAL HIGH (ref 70–99)
Glucose-Capillary: 213 mg/dL — ABNORMAL HIGH (ref 70–99)
Glucose-Capillary: 198 mg/dL — ABNORMAL HIGH (ref 70–99)
Glucose-Capillary: 244 mg/dL — ABNORMAL HIGH (ref 70–99)

## 2020-08-23 LAB — MAGNESIUM: Magnesium: 2.1 mg/dL (ref 1.7–2.4)

## 2020-08-23 LAB — C-REACTIVE PROTEIN: CRP: 19 mg/dL — ABNORMAL HIGH (ref <1.0)

## 2020-08-23 LAB — BRAIN NATRIURETIC PEPTIDE: B Natriuretic Peptide: 69.9 pg/mL (ref 0.0–100.0)

## 2020-08-23 MED ORDER — ISOSORBIDE MONONITRATE ER 30 MG PO TB24
15.0000 mg | ORAL_TABLET | Freq: Every day | ORAL | Status: DC
  Administered 2020-08-24 – 2020-08-26 (×3): 15 mg via ORAL
  Filled 2020-08-23 (×3): qty 1

## 2020-08-23 MED ORDER — POTASSIUM CHLORIDE CRYS ER 20 MEQ PO TBCR
20.0000 meq | EXTENDED_RELEASE_TABLET | Freq: Every day | ORAL | Status: DC
  Administered 2020-08-23: 20 meq via ORAL
  Filled 2020-08-23: qty 1

## 2020-08-23 NOTE — Progress Notes (Signed)
PROGRESS NOTE                                                                                                                                                                                                             Patient Demographics:    [name], is a 80 y.o. female, DOB - 10-Jan-1941, TMH:[id number]  Outpatient Primary MD for the patient is Georgianne Fick, MD    LOS - 2  Admit date - 08/20/2020    Chief Complaint  Patient presents with   Altered Mental Status       Brief Narrative (HPI from H&P) - [name] is a 80 y.o. female with medical history significant of insulin-dependent type 2 diabetes, obesity (BMI 41.10), history of TIA, history of pericarditis, hypertension, hyperlipidemia, OSA on CPAP, hypothyroidism, depression, bipolar disorder was brought in by EMS for some confusion along with generalized abdominal pain and shortness of breath.  In the ER she was found to have possible pneumonia and fluid overload and admitted.   Subjective:   Patient in bed, appears comfortable, denies any headache, no fever, no chest pain or pressure, no shortness of breath , no abdominal pain. No new focal weakness.   Assessment  & Plan :    Subacute generalized abdominal pain along with orthopnea, shortness of breath and massive fluid overload - her abdominal exam is benign and CT scan abdomen pelvis with oral and IV contrast is nonacute except for fatty liver, she does have diverticulosis and some constipation.    Most of her issues seem to be due to fluid overload and responded very well to diuresis with IV Lasix most of her symptoms have resolved, no diarrhea or abdominal pain now, breathing is improved quite a bit continue diuresis with IV Lasix and monitor closely.  2.  Acute on chronic diastolic CHF recent EF around 92%.  Continue IV diuresis, fluid restriction and monitor mild elevation of troponin due to  CHF decompensation.  No chest pain.  EKG nonacute.  Monitor.  3.  History of TIA.  On aspirin and statin for secondary prevention.  4.  Hypertension.  On calcium channel blocker.  Continue.  5.  Dyslipidemia.  On statin.  6. Morbid Obesity.  BMI of 41.  Follow with PCP for weight loss. CPAP nightly for OSA.  7.  Questionable pneumonia.  Clinically doubt she has pneumonia, continue antibiotics for another 1 to 2 days and monitor clinically, will give short course of antibiotics, monitoring CRP along with CBC and procalcitonin.  8. Hypokalemia.  Replaced.    9. DM type II.  Placed on Lantus and sliding scale monitor  Lab Results  Component Value Date   HGBA1C 7.8 (H) 07/04/2020   CBG (last 3)  Recent Labs    08/22/20 1713 08/22/20 2056 08/23/20 0804  GLUCAP 178* 191* 154*         Condition - Fair  Family Communication  : None bedside on 08/22/2020  Code Status :  Full  Consults  :  None  PUD Prophylaxis : PPI   Procedures  :     CT scan abdomen pelvis with oral IV contrast.  Fatty liver and diverticulosis.  Echocardiogram. 05/20/20 -  1. Left ventricular ejection fraction, by estimation, is 70 to 75%. The left ventricle has hyperdynamic function. The left ventricle has no regional wall motion abnormalities. There is moderate concentric left ventricular hypertrophy. Left ventricular diastolic parameters are consistent with Grade I diastolic dysfunction  (impaired relaxation).   2. Right ventricular systolic function is normal. The right ventricular size is normal. Tricuspid regurgitation signal is inadequate for assessing PA pressure.   3. The mitral valve is degenerative. Trivial mitral valve regurgitation. No evidence of mitral stenosis.   4. The aortic valve is tricuspid. There is mild calcification of the aortic valve. Aortic valve regurgitation is not visualized. No aortic stenosis is present.       Disposition Plan  :    Status is: Inpatient  Remains  inpatient appropriate because:IV treatments appropriate due to intensity of illness or inability to take PO  Dispo: The patient is from: Home              Anticipated d/c is to: Home              Patient currently is not medically stable to d/c.   Difficult to place patient No   DVT Prophylaxis  :    Place TED hose Start: 08/22/20 0951 Place TED hose Start: 08/21/20 0825 enoxaparin (LOVENOX) injection 40 mg Start: 08/21/20 0600  Lab Results  Component Value Date   PLT 211 08/23/2020    Diet :  Diet Order             DIET SOFT Room service appropriate? No; Fluid consistency: Thin  Diet effective now                    Inpatient Medications  Scheduled Meds:  aspirin EC  81 mg Oral Daily   carbamazepine  200 mg Oral BID   diltiazem  120 mg Oral QHS   docusate sodium  100 mg Oral BID   enoxaparin (LOVENOX) injection  40 mg Subcutaneous Q24H   folic acid  1 mg Oral Daily   insulin aspart  0-15 Units Subcutaneous TID WC   insulin aspart  0-5 Units Subcutaneous QHS   insulin glargine-yfgn  15 Units Subcutaneous QHS   [START ON 08/24/2020] isosorbide mononitrate  15 mg Oral Daily   levothyroxine  75 mcg Oral QAC breakfast   pantoprazole  40 mg Oral Daily   potassium chloride  20 mEq Oral Daily   pravastatin  20 mg Oral Daily   QUEtiapine  100 mg Oral QHS   spironolactone  25 mg Oral Daily   Continuous Infusions:  azithromycin (ZITHROMAX) 500 MG IVPB (  Vial-Mate [employer]) Stopped (08/22/20 2333)   cefTRIAXone (ROCEPHIN)  IV Stopped (08/22/20 2225)   PRN Meds:.acetaminophen **OR** [DISCONTINUED] acetaminophen, ondansetron (ZOFRAN) IV, traMADol  Antibiotics  :    Anti-infectives (From admission, onward)    Start     Dose/Rate Route Frequency Ordered Stop   08/21/20 2230  azithromycin (ZITHROMAX) 500 mg in sodium chloride 0.9 % 250 mL IVPB        500 mg 250 mL/hr over 60 Minutes Intravenous Every 24 hours 08/21/20 0259 08/24/20 2229   08/21/20 2230  cefTRIAXone  (ROCEPHIN) 1 g in sodium chloride 0.9 % 100 mL IVPB        1 g 200 mL/hr over 30 Minutes Intravenous Every 24 hours 08/21/20 0259 08/26/20 2229   08/20/20 2230  cefTRIAXone (ROCEPHIN) 1 g in sodium chloride 0.9 % 100 mL IVPB        1 g 200 mL/hr over 30 Minutes Intravenous  Once 08/20/20 2228 08/20/20 2345   08/20/20 2230  azithromycin (ZITHROMAX) 500 mg in sodium chloride 0.9 % 250 mL IVPB        500 mg 250 mL/hr over 60 Minutes Intravenous  Once 08/20/20 2228 08/20/20 2359        Time Spent in minutes  30   Susa Raring M.D on 08/23/2020 at 9:53 AM  To page go to [email or web address]   [facility] -  Office  [phone]    See all Orders from today for further details    Objective:   Vitals:   08/23/20 0000 08/23/20 0350 08/23/20 0500 08/23/20 0800  BP: (!) 141/76  (!) 147/71 123/64  Pulse: 84 84 75 82  Resp: 19 (!) Temp:  98.1 F (36.7 C) 98 F (36.7 C) (!) 97.5 F (36.4 C)  TempSrc:  Oral Oral Oral  SpO2: 96% 95% 96% 96%  Weight:      Height:        Wt Readings from Last 3 Encounters:  08/21/20 105.2 kg  07/10/20 108.6 kg  06/13/20 107 kg     Intake/Output Summary (Last 24 hours) at 08/23/2020 0953 Last data filed at 08/23/2020 0909 Gross per 24 hour  Intake 1280 ml  Output 1000 ml  Net 280 ml     Physical Exam  Awake Alert, No new F.N deficits, Normal affect [place].AT,PERRAL Supple Neck,No JVD, No cervical lymphadenopathy appriciated.  Symmetrical Chest wall movement, Good air movement bilaterally, CTAB RRR,No Gallops, Rubs or new Murmurs, No Parasternal Heave +ve B.Sounds, Abd distended but no focal tenderness, 1+ leg edema   Data Review:    CBC Recent Labs  Lab 08/20/20 2116 08/22/20 0205 08/23/20 0033  WBC 8.6 7.4 6.0  HGB 13.4 12.6 13.1  HCT 40.5 38.6 39.4  PLT 261 215 211  MCV 91.6 91.7 90.6  MCH 30.3 29.9 30.1  MCHC 33.1 32.6 33.2  RDW 14.6 14.6 14.4  LYMPHSABS 1.2 1.6 1.4  MONOABS 1.1* 1.3* 1.1*  EOSABS 0.2 0.1 0.2   BASOSABS 0.0 0.0 0.0    Recent Labs  Lab 08/20/20 2116 08/20/20 2117 08/20/20 2118 08/20/20 2316 08/21/20 0344 08/22/20 0205 08/22/20 0733 08/23/20 0033  NA 130*  --   --   --  139 139  --  136  K 3.9  --   --   --  4.2 3.5  --  3.9  CL 96*  --   --   --  104 100  --  100  CO2 24  --   --   --  26 31  --  27  GLUCOSE 209*  --   --   --  141* 148*  --  157*  BUN 20  --   --   --  17 13  --  13  CREATININE 1.10*  --   --   --  1.01* 0.96  --  0.88  CALCIUM 8.3*  --   --   --  8.2* 8.2*  --  8.8*  AST 22  --   --   --   --  19  --  28  ALT 31  --   --   --   --  25  --  25  ALKPHOS 76  --   --   --   --  69  --  70  BILITOT 0.6  --   --   --   --  0.4  --  0.5  ALBUMIN 3.4*  --   --   --   --  3.0*  --  3.0*  MG  --   --   --   --   --  2.1  --  2.1  CRP  --   --   --   --   --  20.2*  --  19.0*  PROCALCITON  --   --   --   --   --   --  0.11 0.11  LATICACIDVEN 3.1*  --   --  2.3* 1.9  --   --   --   TSH  --  1.526  --   --   --   --   --   --   AMMONIA  --  29  --   --   --   --   --   --   BNP  --   --  186.8*  --   --  134.7*  --  69.9    ------------------------------------------------------------------------------------------------------------------ No results for input(s): CHOL, HDL, LDLCALC, TRIG, CHOLHDL, LDLDIRECT in the last 72 hours.  Lab Results  Component Value Date   HGBA1C 7.8 (H) 07/04/2020   ------------------------------------------------------------------------------------------------------------------ Recent Labs    08/20/20 2117  TSH 1.526    Cardiac Enzymes No results for input(s): CKMB, TROPONINI, MYOGLOBIN in the last 168 hours.  Invalid input(s): CK ------------------------------------------------------------------------------------------------------------------    Component Value Date/Time   BNP 69.9 08/23/2020 0033    Radiology Reports CT ABDOMEN PELVIS WO CONTRAST  Result Date: 08/20/2020 CLINICAL DATA:  Acute nonlocalized  abdominal pain EXAM: CT ABDOMEN AND PELVIS WITHOUT CONTRAST TECHNIQUE: Multidetector CT imaging of the abdomen and pelvis was performed following the standard protocol without IV contrast. COMPARISON:  10/16/2015 FINDINGS: Lower chest: Dependent consolidation within the left lower lobe likely reflects atelectasis. No effusion or pneumothorax. Hepatobiliary: There is diffuse hepatic steatosis. No focal liver abnormality on this unenhanced exam. The gallbladder is surgically absent. Pancreas: Unremarkable. No pancreatic ductal dilatation or surrounding inflammatory changes. Spleen: Normal in size without focal abnormality. Adrenals/Urinary Tract: Chronic thickening of the left adrenal gland consistent with adenoma or hyperplasia. The right adrenals unremarkable. No urinary tract calculi or obstructive uropathy within either kidney. The bladder is unremarkable. Stomach/Bowel: No bowel obstruction or ileus. The appendix is surgically absent. Diverticulosis of the distal colon without diverticulitis. No bowel wall thickening or inflammatory change. Small hiatal hernia. Vascular/Lymphatic: Aortic atherosclerosis. No enlarged abdominal or pelvic lymph nodes. Reproductive: Status post hysterectomy. No adnexal masses. Other: No free fluid or free gas.  No  abdominal wall hernia. Musculoskeletal: No acute or destructive bony lesions. Reconstructed images demonstrate no additional findings. IMPRESSION: 1. Diverticulosis without diverticulitis. 2. Hepatic steatosis. 3. Small hiatal hernia. 4.  Aortic Atherosclerosis (ICD10-I70.0). Electronically Signed   By: Sharlet Salina M.D.   On: 08/20/2020 23:37   DG Abd 1 View  Result Date: 08/21/2020 CLINICAL DATA:  Abdominal pain EXAM: ABDOMEN - 1 VIEW COMPARISON:  10/01/2019 FINDINGS: The bowel gas pattern is normal. No radio-opaque calculi or other significant radiographic abnormality are seen. Surgical clips right upper quadrant IMPRESSION: Negative. Electronically Signed   By:  Marlan Palau M.D.   On: 08/21/2020 08:29   CT HEAD WO CONTRAST ( )  Result Date: 08/20/2020 CLINICAL DATA:  Mental status change, unknown cause EXAM: CT HEAD WITHOUT CONTRAST TECHNIQUE: Contiguous axial images were obtained from the base of the skull through the vertex without intravenous contrast. COMPARISON:  MRI 06/23/2017 FINDINGS: Brain: Small foci of macroscopic fat within the falx are compatible with tiny dermoids measuring up to 9 mm. Relatively mild parenchymal volume loss given the patient's age. Mild periventricular white matter changes are present likely reflecting the sequela of small vessel ischemia. No abnormal intra or extra-axial mass lesion or fluid collection. No abnormal mass effect or midline shift. No evidence of acute intracranial hemorrhage or infarct. Ventricular size is normal. Cerebellum unremarkable. Vascular: Unremarkable Skull: Intact Sinuses/Orbits: Paranasal sinuses are clear. Right scleral banding has been performed. Orbits are otherwise unremarkable. Other: Mastoid air cells and middle ear cavities are clear. IMPRESSION: No acute intracranial abnormality. Mild senescent change. Electronically Signed   By: Helyn Numbers MD   On: 08/20/2020 23:45   CT ABDOMEN PELVIS W CONTRAST  Result Date: 08/21/2020 CLINICAL DATA:  Abdominal pain, fever EXAM: CT ABDOMEN AND PELVIS WITH CONTRAST TECHNIQUE: Multidetector CT imaging of the abdomen and pelvis was performed using the standard protocol following bolus administration of intravenous contrast. CONTRAST:  80mL OMNIPAQUE IOHEXOL 350 MG/ML SOLN COMPARISON:  08/20/2020 FINDINGS: Lower chest: Bibasilar atelectasis or scarring. Heart is normal size. Hepatobiliary: Prior cholecystectomy. Diffuse fatty infiltration of the liver. No focal hepatic abnormality. Pancreas: No focal abnormality or ductal dilatation. Spleen: No focal abnormality.  Normal size. Adrenals/Urinary Tract: No adrenal abnormality. No focal renal abnormality. No stones  or hydronephrosis. Urinary bladder is unremarkable. Stomach/Bowel: Sigmoid diverticulosis. No active diverticulitis. Stomach and small bowel decompressed, unremarkable. Vascular/Lymphatic: Scattered aortic atherosclerosis. No evidence of aneurysm or adenopathy. Reproductive: Prior hysterectomy.  No adnexal masses. Other: No free fluid or free air. Musculoskeletal: No acute bony abnormality. IMPRESSION: Hepatic steatosis. Bibasilar atelectasis or scarring, stable. Sigmoid diverticulosis.  No active diverticulitis. Aortic atherosclerosis. No acute findings. Electronically Signed   By: Charlett Nose M.D.   On: 08/21/2020 09:16   DG Chest Port 1 View  Result Date: 08/22/2020 CLINICAL DATA:  Shortness of breath. EXAM: PORTABLE CHEST 1 VIEW COMPARISON:  08/21/2020 FINDINGS: Heart size and mediastinal contours are unremarkable. No pleural effusion or edema identified. No airspace densities. Visualized osseous structures are unremarkable. IMPRESSION: No acute cardiopulmonary abnormalities. Electronically Signed   By: Signa Kell M.D.   On: 08/22/2020 08:38   DG Chest Port 1 View  Result Date: 08/21/2020 CLINICAL DATA:  Short of breath, abdominal pain EXAM: PORTABLE CHEST 1 VIEW COMPARISON:  08/20/2020 FINDINGS: Hypoventilation with mild atelectasis in the lung bases. Left lower lobe airspace disease unchanged. Cardiac enlargement with mild vascular congestion. Negative for edema or effusion. IMPRESSION: Hypoventilation with bibasilar atelectasis. Mild left lower lobe airspace disease unchanged Mild vascular  congestion without edema. Electronically Signed   By: Marlan Palau M.D.   On: 08/21/2020 08:18   DG Chest Portable 1 View  Result Date: 08/20/2020 CLINICAL DATA:  Dyspnea EXAM: PORTABLE CHEST 1 VIEW COMPARISON:  09/05/2018 FINDINGS: Lung volumes are small and there is resultant vascular crowding at the hila. There is superimposed asymmetric interstitial pulmonary infiltrate within the left mid and lower lung  zone, infection versus asymmetric pulmonary edema. No pneumothorax or pleural effusion. Cardiac size within normal limits. No acute bone abnormality. IMPRESSION: Asymmetric left mid and lower lung zone pulmonary infiltrate, possibly infectious in the appropriate clinical setting. Pulmonary hypoinflation. Electronically Signed   By: Helyn Numbers MD   On: 08/20/2020 21:49

## 2020-08-23 NOTE — Progress Notes (Signed)
Pt refused CPAP for tonight.  

## 2020-08-24 DIAGNOSIS — R410 Disorientation, unspecified: Secondary | ICD-10-CM | POA: Diagnosis not present

## 2020-08-24 DIAGNOSIS — G9341 Metabolic encephalopathy: Secondary | ICD-10-CM | POA: Diagnosis not present

## 2020-08-24 DIAGNOSIS — J189 Pneumonia, unspecified organism: Secondary | ICD-10-CM | POA: Diagnosis not present

## 2020-08-24 LAB — GLUCOSE, CAPILLARY
Glucose-Capillary: 260 mg/dL — ABNORMAL HIGH (ref 70–99)
Glucose-Capillary: 265 mg/dL — ABNORMAL HIGH (ref 70–99)
Glucose-Capillary: 264 mg/dL — ABNORMAL HIGH (ref 70–99)
Glucose-Capillary: 162 mg/dL — ABNORMAL HIGH (ref 70–99)
Glucose-Capillary: 196 mg/dL — ABNORMAL HIGH (ref 70–99)

## 2020-08-24 LAB — BASIC METABOLIC PANEL
Anion gap: 9 (ref 5–15)
BUN: 10 mg/dL (ref 8–23)
CO2: 25 mmol/L (ref 22–32)
Calcium: 8.8 mg/dL — ABNORMAL LOW (ref 8.9–10.3)
Chloride: 101 mmol/L (ref 98–111)
Creatinine, Ser: 0.81 mg/dL (ref 0.44–1.00)
GFR, Estimated: >60 mL/min (ref >60)
Glucose, Bld: 174 mg/dL — ABNORMAL HIGH (ref 70–99)
Potassium: 3.4 mmol/L — ABNORMAL LOW (ref 3.5–5.1)
Sodium: 135 mmol/L (ref 135–145)

## 2020-08-24 MED ORDER — POTASSIUM CHLORIDE CRYS ER 20 MEQ PO TBCR: 20.0000 meq | EXTENDED_RELEASE_TABLET | Freq: Every day | ORAL | Status: DC

## 2020-08-24 MED ORDER — FUROSEMIDE 10 MG/ML IJ SOLN
40.0000 mg | Freq: Two times a day (BID) | INTRAMUSCULAR | Status: DC
  Administered 2020-08-24: 40 mg via INTRAVENOUS
  Filled 2020-08-24: qty 4

## 2020-08-24 MED ORDER — POTASSIUM CHLORIDE CRYS ER 20 MEQ PO TBCR
40.0000 meq | EXTENDED_RELEASE_TABLET | Freq: Two times a day (BID) | ORAL | Status: AC
  Administered 2020-08-24 (×2): 40 meq via ORAL
  Filled 2020-08-24 (×2): qty 2

## 2020-08-24 NOTE — Plan of Care (Signed)

## 2020-08-24 NOTE — Progress Notes (Signed)
Pt refused CPAP for tonight.  

## 2020-08-24 NOTE — Progress Notes (Signed)
PROGRESS NOTE                                                                                                                                                                                                             Patient Demographics:    [name], is a 80 y.o. female, DOB - 08-15-1940, WUJ:[id number]  Outpatient Primary MD for the patient is Georgianne Fick, MD    LOS - 3  Admit date - 08/20/2020    Chief Complaint  Patient presents with   Altered Mental Status       Brief Narrative (HPI from H&P) - [name] is a 80 y.o. female with medical history significant of insulin-dependent type 2 diabetes, obesity (BMI 41.10), history of TIA, history of pericarditis, hypertension, hyperlipidemia, OSA on CPAP, hypothyroidism, depression, bipolar disorder was brought in by EMS for some confusion along with generalized abdominal pain and shortness of breath.  In the ER she was found to have possible pneumonia and fluid overload and admitted.   Subjective:   Patient in bed, appears comfortable, denies any headache, no fever, no chest pain or pressure, no shortness of breath , no abdominal pain. No new focal weakness.    Assessment  & Plan :    Subacute generalized abdominal pain along with orthopnea, shortness of breath and massive fluid overload - her abdominal exam is benign and CT scan abdomen pelvis with oral and IV contrast is nonacute except for fatty liver, she does have diverticulosis and some constipation.    Most of her issues seem to be due to fluid overload and responded very well to diuresis with IV Lasix most of her symptoms have resolved, no diarrhea or abdominal pain now, breathing is improved quite a bit continue diuresis with IV Lasix and monitor closely.  2.  Acute on chronic diastolic CHF recent EF around 95%.  Continue IV diuresis, fluid restriction and monitor mild elevation of troponin due  to CHF decompensation.  No chest pain.  EKG nonacute.  Monitor.  3.  History of TIA.  On aspirin and statin for secondary prevention.  4.  Hypertension.  On calcium channel blocker.  Continue.  5.  Dyslipidemia.  On statin.  6. Morbid Obesity.  BMI of 41.  Follow with PCP for weight loss. CPAP nightly for OSA.  7.  Questionable  pneumonia.  Clinically doubt she has pneumonia, continue antibiotics for another 1 to 2 days and monitor clinically, ABX x 5 days, monitoring CRP along with CBC and procalcitonin.  8. Hypokalemia.  Replaced.    9. DM type II.  Placed on Lantus and sliding scale monitor  Lab Results  Component Value Date   HGBA1C 7.8 (H) 07/04/2020   CBG (last 3)  Recent Labs    08/23/20 2050 08/24/20 0904 08/24/20 1053  GLUCAP 244* 260* 265*         Condition - Fair  Family Communication  : None bedside on 08/22/2020, 08/24/20  Code Status :  Full  Consults  :  None  PUD Prophylaxis : PPI   Procedures  :     CT scan abdomen pelvis with oral IV contrast.  Fatty liver and diverticulosis.  Echocardiogram. 05/20/20 -  1. Left ventricular ejection fraction, by estimation, is 70 to 75%. The left ventricle has hyperdynamic function. The left ventricle has no regional wall motion abnormalities. There is moderate concentric left ventricular hypertrophy. Left ventricular diastolic parameters are consistent with Grade I diastolic dysfunction  (impaired relaxation).   2. Right ventricular systolic function is normal. The right ventricular size is normal. Tricuspid regurgitation signal is inadequate for assessing PA pressure.   3. The mitral valve is degenerative. Trivial mitral valve regurgitation. No evidence of mitral stenosis.   4. The aortic valve is tricuspid. There is mild calcification of the aortic valve. Aortic valve regurgitation is not visualized. No aortic stenosis is present.       Disposition Plan  :    Status is: Inpatient  Remains inpatient appropriate  because:IV treatments appropriate due to intensity of illness or inability to take PO  Dispo: The patient is from: Home              Anticipated d/c is to: Home              Patient currently is not medically stable to d/c.   Difficult to place patient No   DVT Prophylaxis  :    Place TED hose Start: 08/22/20 0951 Place TED hose Start: 08/21/20 0825 enoxaparin (LOVENOX) injection 40 mg Start: 08/21/20 0600  Lab Results  Component Value Date   PLT 211 08/23/2020    Diet :  Diet Order             DIET SOFT Room service appropriate? No; Fluid consistency: Thin  Diet effective now                    Inpatient Medications  Scheduled Meds:  aspirin EC  81 mg Oral Daily   carbamazepine  200 mg Oral BID   diltiazem  120 mg Oral QHS   docusate sodium  100 mg Oral BID   enoxaparin (LOVENOX) injection  40 mg Subcutaneous Q24H   folic acid  1 mg Oral Daily   furosemide  40 mg Intravenous BID   insulin aspart  0-15 Units Subcutaneous TID WC   insulin aspart  0-5 Units Subcutaneous QHS   insulin glargine-yfgn  15 Units Subcutaneous QHS   isosorbide mononitrate  15 mg Oral Daily   levothyroxine  75 mcg Oral QAC breakfast   pantoprazole  40 mg Oral Daily   [START ON 08/25/2020] potassium chloride  20 mEq Oral Daily   potassium chloride  40 mEq Oral BID   pravastatin  20 mg Oral Daily   QUEtiapine  100 mg Oral QHS  spironolactone  25 mg Oral Daily   Continuous Infusions:  cefTRIAXone (ROCEPHIN)  IV Stopped (08/23/20 2210)   PRN Meds:.acetaminophen **OR** [DISCONTINUED] acetaminophen, ondansetron (ZOFRAN) IV, traMADol  Antibiotics  :    Anti-infectives (From admission, onward)    Start     Dose/Rate Route Frequency Ordered Stop   08/21/20 2230  azithromycin (ZITHROMAX) 500 mg in sodium chloride 0.9 % 250 mL IVPB        500 mg 250 mL/hr over 60 Minutes Intravenous Every 24 hours 08/21/20 0259 08/23/20 2332   08/21/20 2230  cefTRIAXone (ROCEPHIN) 1 g in sodium chloride  0.9 % 100 mL IVPB        1 g 200 mL/hr over 30 Minutes Intravenous Every 24 hours 08/21/20 0259 08/26/20 2229   08/20/20 2230  cefTRIAXone (ROCEPHIN) 1 g in sodium chloride 0.9 % 100 mL IVPB        1 g 200 mL/hr over 30 Minutes Intravenous  Once 08/20/20 2228 08/20/20 2345   08/20/20 2230  azithromycin (ZITHROMAX) 500 mg in sodium chloride 0.9 % 250 mL IVPB        500 mg 250 mL/hr over 60 Minutes Intravenous  Once 08/20/20 2228 08/20/20 2359        Time Spent in minutes  30   Susa Raring M.D on 08/24/2020 at 11:22 AM  To page go to [email or web address]   [facility] -  Office  [phone]    See all Orders from today for further details    Objective:   Vitals:   08/23/20 1959 08/23/20 2340 08/24/20 0400 08/24/20 0900  BP: (!) 152/77 (!) 162/75 (!) 155/72 (!) 159/73  Pulse: 88 78 83 88  Resp: 16 16 20 20   Temp: 98.9 F (37.2 C) 98 F (36.7 C) 98.3 F (36.8 C) 98.6 F (37 C)  TempSrc: Oral Oral Oral Oral  SpO2: 95% 94% 94% 94%  Weight:      Height:        Wt Readings from Last 3 Encounters:  08/21/20 105.2 kg  07/10/20 108.6 kg  06/13/20 107 kg     Intake/Output Summary (Last 24 hours) at 08/24/2020 1122 Last data filed at 08/24/2020 0533 Gross per 24 hour  Intake 590 ml  Output 2200 ml  Net -1610 ml     Physical Exam  Awake Alert, No new F.N deficits, Normal affect [place].AT,PERRAL Supple Neck,No JVD, No cervical lymphadenopathy appriciated.  Symmetrical Chest wall movement, Good air movement bilaterally, CTAB RRR,No Gallops, Rubs or new Murmurs, No Parasternal Heave +ve B.Sounds, Abd Soft, No tenderness, No organomegaly appriciated, No rebound - guarding or rigidity. No Cyanosis, trace edema - TEDs    Data Review:    CBC Recent Labs  Lab 08/20/20 2116 08/22/20 0205 08/23/20 0033  WBC 8.6 7.4 6.0  HGB 13.4 12.6 13.1  HCT 40.5 38.6 39.4  PLT 261 215 211  MCV 91.6 91.7 90.6  MCH 30.3 29.9 30.1  MCHC 33.1 32.6 33.2  RDW 14.6 14.6 14.4   LYMPHSABS 1.2 1.6 1.4  MONOABS 1.1* 1.3* 1.1*  EOSABS 0.2 0.1 0.2  BASOSABS 0.0 0.0 0.0    Recent Labs  Lab 08/20/20 2116 08/20/20 2117 08/20/20 2118 08/20/20 2316 08/21/20 0344 08/22/20 0205 08/22/20 0733 08/23/20 0033 08/24/20 0046  NA 130*  --   --   --  139 139  --  136 135  K 3.9  --   --   --  4.2 3.5  --  3.9 3.4*  CL 96*  --   --   --  104 100  --  100 101  CO2 24  --   --   --  26 31  --  27 25  GLUCOSE 209*  --   --   --  141* 148*  --  157* 174*  BUN 20  --   --   --  17 13  --  13 10  CREATININE 1.10*  --   --   --  1.01* 0.96  --  0.88 0.81  CALCIUM 8.3*  --   --   --  8.2* 8.2*  --  8.8* 8.8*  AST 22  --   --   --   --  19  --  28  --   ALT 31  --   --   --   --  25  --  25  --   ALKPHOS 76  --   --   --   --  69  --  70  --   BILITOT 0.6  --   --   --   --  0.4  --  0.5  --   ALBUMIN 3.4*  --   --   --   --  3.0*  --  3.0*  --   MG  --   --   --   --   --  2.1  --  2.1  --   CRP  --   --   --   --   --  20.2*  --  19.0*  --   PROCALCITON  --   --   --   --   --   --  0.11 0.11  --   LATICACIDVEN 3.1*  --   --  2.3* 1.9  --   --   --   --   TSH  --  1.526  --   --   --   --   --   --   --   AMMONIA  --  29  --   --   --   --   --   --   --   BNP  --   --  186.8*  --   --  134.7*  --  69.9  --     ------------------------------------------------------------------------------------------------------------------ No results for input(s): CHOL, HDL, LDLCALC, TRIG, CHOLHDL, LDLDIRECT in the last 72 hours.  Lab Results  Component Value Date   HGBA1C 7.8 (H) 07/04/2020   ------------------------------------------------------------------------------------------------------------------ No results for input(s): TSH, T4TOTAL, T3FREE, THYROIDAB in the last 72 hours.  Invalid input(s): FREET3   Cardiac Enzymes No results for input(s): CKMB, TROPONINI, MYOGLOBIN in the last 168 hours.  Invalid input(s):  CK ------------------------------------------------------------------------------------------------------------------    Component Value Date/Time   BNP 69.9 08/23/2020 0033    Radiology Reports CT ABDOMEN PELVIS WO CONTRAST  Result Date: 08/20/2020 CLINICAL DATA:  Acute nonlocalized abdominal pain EXAM: CT ABDOMEN AND PELVIS WITHOUT CONTRAST TECHNIQUE: Multidetector CT imaging of the abdomen and pelvis was performed following the standard protocol without IV contrast. COMPARISON:  10/16/2015 FINDINGS: Lower chest: Dependent consolidation within the left lower lobe likely reflects atelectasis. No effusion or pneumothorax. Hepatobiliary: There is diffuse hepatic steatosis. No focal liver abnormality on this unenhanced exam. The gallbladder is surgically absent. Pancreas: Unremarkable. No pancreatic ductal dilatation or surrounding inflammatory changes. Spleen: Normal in size without focal abnormality. Adrenals/Urinary Tract: Chronic thickening of the left adrenal gland  consistent with adenoma or hyperplasia. The right adrenals unremarkable. No urinary tract calculi or obstructive uropathy within either kidney. The bladder is unremarkable. Stomach/Bowel: No bowel obstruction or ileus. The appendix is surgically absent. Diverticulosis of the distal colon without diverticulitis. No bowel wall thickening or inflammatory change. Small hiatal hernia. Vascular/Lymphatic: Aortic atherosclerosis. No enlarged abdominal or pelvic lymph nodes. Reproductive: Status post hysterectomy. No adnexal masses. Other: No free fluid or free gas.  No abdominal wall hernia. Musculoskeletal: No acute or destructive bony lesions. Reconstructed images demonstrate no additional findings. IMPRESSION: 1. Diverticulosis without diverticulitis. 2. Hepatic steatosis. 3. Small hiatal hernia. 4.  Aortic Atherosclerosis (ICD10-I70.0). Electronically Signed   By: Sharlet Salina M.D.   On: 08/20/2020 23:37   DG Abd 1 View  Result Date:  08/21/2020 CLINICAL DATA:  Abdominal pain EXAM: ABDOMEN - 1 VIEW COMPARISON:  10/01/2019 FINDINGS: The bowel gas pattern is normal. No radio-opaque calculi or other significant radiographic abnormality are seen. Surgical clips right upper quadrant IMPRESSION: Negative. Electronically Signed   By: Marlan Palau M.D.   On: 08/21/2020 08:29   CT HEAD WO CONTRAST ( )  Result Date: 08/20/2020 CLINICAL DATA:  Mental status change, unknown cause EXAM: CT HEAD WITHOUT CONTRAST TECHNIQUE: Contiguous axial images were obtained from the base of the skull through the vertex without intravenous contrast. COMPARISON:  MRI 06/23/2017 FINDINGS: Brain: Small foci of macroscopic fat within the falx are compatible with tiny dermoids measuring up to 9 mm. Relatively mild parenchymal volume loss given the patient's age. Mild periventricular white matter changes are present likely reflecting the sequela of small vessel ischemia. No abnormal intra or extra-axial mass lesion or fluid collection. No abnormal mass effect or midline shift. No evidence of acute intracranial hemorrhage or infarct. Ventricular size is normal. Cerebellum unremarkable. Vascular: Unremarkable Skull: Intact Sinuses/Orbits: Paranasal sinuses are clear. Right scleral banding has been performed. Orbits are otherwise unremarkable. Other: Mastoid air cells and middle ear cavities are clear. IMPRESSION: No acute intracranial abnormality. Mild senescent change. Electronically Signed   By: Helyn Numbers MD   On: 08/20/2020 23:45   CT ABDOMEN PELVIS W CONTRAST  Result Date: 08/21/2020 CLINICAL DATA:  Abdominal pain, fever EXAM: CT ABDOMEN AND PELVIS WITH CONTRAST TECHNIQUE: Multidetector CT imaging of the abdomen and pelvis was performed using the standard protocol following bolus administration of intravenous contrast. CONTRAST:  24mL OMNIPAQUE IOHEXOL 350 MG/ML SOLN COMPARISON:  08/20/2020 FINDINGS: Lower chest: Bibasilar atelectasis or scarring. Heart is normal  size. Hepatobiliary: Prior cholecystectomy. Diffuse fatty infiltration of the liver. No focal hepatic abnormality. Pancreas: No focal abnormality or ductal dilatation. Spleen: No focal abnormality.  Normal size. Adrenals/Urinary Tract: No adrenal abnormality. No focal renal abnormality. No stones or hydronephrosis. Urinary bladder is unremarkable. Stomach/Bowel: Sigmoid diverticulosis. No active diverticulitis. Stomach and small bowel decompressed, unremarkable. Vascular/Lymphatic: Scattered aortic atherosclerosis. No evidence of aneurysm or adenopathy. Reproductive: Prior hysterectomy.  No adnexal masses. Other: No free fluid or free air. Musculoskeletal: No acute bony abnormality. IMPRESSION: Hepatic steatosis. Bibasilar atelectasis or scarring, stable. Sigmoid diverticulosis.  No active diverticulitis. Aortic atherosclerosis. No acute findings. Electronically Signed   By: Charlett Nose M.D.   On: 08/21/2020 09:16   DG Chest Port 1 View  Result Date: 08/22/2020 CLINICAL DATA:  Shortness of breath. EXAM: PORTABLE CHEST 1 VIEW COMPARISON:  08/21/2020 FINDINGS: Heart size and mediastinal contours are unremarkable. No pleural effusion or edema identified. No airspace densities. Visualized osseous structures are unremarkable. IMPRESSION: No acute cardiopulmonary abnormalities. Electronically Signed  By: Signa Kellaylor  Stroud M.D.   On: 08/22/2020 08:38   DG Chest Port 1 View  Result Date: 08/21/2020 CLINICAL DATA:  Short of breath, abdominal pain EXAM: PORTABLE CHEST 1 VIEW COMPARISON:  08/20/2020 FINDINGS: Hypoventilation with mild atelectasis in the lung bases. Left lower lobe airspace disease unchanged. Cardiac enlargement with mild vascular congestion. Negative for edema or effusion. IMPRESSION: Hypoventilation with bibasilar atelectasis. Mild left lower lobe airspace disease unchanged Mild vascular congestion without edema. Electronically Signed   By: Marlan Palauharles  Clark M.D.   On: 08/21/2020 08:18   DG Chest Portable  1 View  Result Date: 08/20/2020 CLINICAL DATA:  Dyspnea EXAM: PORTABLE CHEST 1 VIEW COMPARISON:  09/05/2018 FINDINGS: Lung volumes are small and there is resultant vascular crowding at the hila. There is superimposed asymmetric interstitial pulmonary infiltrate within the left mid and lower lung zone, infection versus asymmetric pulmonary edema. No pneumothorax or pleural effusion. Cardiac size within normal limits. No acute bone abnormality. IMPRESSION: Asymmetric left mid and lower lung zone pulmonary infiltrate, possibly infectious in the appropriate clinical setting. Pulmonary hypoinflation. Electronically Signed   By: Helyn NumbersAshesh  Parikh MD   On: 08/20/2020 21:49

## 2020-08-25 DIAGNOSIS — G9341 Metabolic encephalopathy: Secondary | ICD-10-CM | POA: Diagnosis not present

## 2020-08-25 DIAGNOSIS — R410 Disorientation, unspecified: Secondary | ICD-10-CM | POA: Diagnosis not present

## 2020-08-25 DIAGNOSIS — J189 Pneumonia, unspecified organism: Secondary | ICD-10-CM | POA: Diagnosis not present

## 2020-08-25 LAB — GLUCOSE, CAPILLARY
Glucose-Capillary: 207 mg/dL — ABNORMAL HIGH (ref 70–99)
Glucose-Capillary: 204 mg/dL — ABNORMAL HIGH (ref 70–99)
Glucose-Capillary: 187 mg/dL — ABNORMAL HIGH (ref 70–99)
Glucose-Capillary: 213 mg/dL — ABNORMAL HIGH (ref 70–99)

## 2020-08-25 LAB — CBC
HCT: 42.6 % (ref 36.0–46.0)
Hemoglobin: 14 g/dL (ref 12.0–15.0)
MCH: 30 pg (ref 26.0–34.0)
MCHC: 32.9 g/dL (ref 30.0–36.0)
MCV: 91.2 fL (ref 80.0–100.0)
Platelets: 307 10*3/uL (ref 150–400)
RBC: 4.67 MIL/uL (ref 3.87–5.11)
RDW: 14.1 % (ref 11.5–15.5)
WBC: 5.5 10*3/uL (ref 4.0–10.5)
nRBC: 0 % (ref 0.0–0.2)

## 2020-08-25 LAB — POTASSIUM: Potassium: 4.3 mmol/L (ref 3.5–5.1)

## 2020-08-25 MED ORDER — FUROSEMIDE 40 MG PO TABS
60.0000 mg | ORAL_TABLET | Freq: Every day | ORAL | Status: DC
  Administered 2020-08-25: 60 mg via ORAL
  Filled 2020-08-25: qty 1

## 2020-08-25 MED ORDER — POTASSIUM CHLORIDE CRYS ER 10 MEQ PO TBCR
10.0000 meq | EXTENDED_RELEASE_TABLET | Freq: Every day | ORAL | Status: DC
  Administered 2020-08-25: 10 meq via ORAL
  Filled 2020-08-25: qty 1

## 2020-08-25 MED ORDER — ALBUTEROL SULFATE (2.5 MG/3ML) 0.083% IN NEBU: 2.5000 mg | INHALATION_SOLUTION | RESPIRATORY_TRACT | Status: DC | PRN

## 2020-08-25 MED ORDER — FUROSEMIDE 40 MG PO TABS: 40.0000 mg | ORAL_TABLET | Freq: Every day | ORAL | Status: DC

## 2020-08-25 NOTE — Progress Notes (Signed)
Patient resting in bed. Son at bedside. Patient medicated with insulin at all three meals. Glucose levels have improved today. No complaints or concerns stated at this time.

## 2020-08-25 NOTE — Progress Notes (Signed)
Occupational Therapy Treatment Patient Details Name: [name] MRN: [id number] DOB: 1940-07-13 Today's Date: 08/25/2020    History of present illness 80yo female admitted 08/20/20 with AMS. CTH negative. Found to be in sepsis secondary to CAP. PMH bipolar, HLD, HTN, TIA, DM, vertigo, urinary incontinence   OT comments  Patient progressing and showed improved in-room ambulation with RW to bathroom, toilet transfers and standing at sink for most grooming with supervision only compared to previous session where pt required Min guard external assistance. Patient remains limited by impaired activity tolerance with increased shortness of breath with standing activities although vitals remaining stable. Pt continues to demonstrate good rehab potential and would benefit from continued skilled OT to increase safety and independence with ADLs and functional transfers to allow pt to return home safely and reduce caregiver burden and fall risk.   Follow Up Recommendations  Home health OT    Equipment Recommendations       Recommendations for Other Services      Precautions / Restrictions Precautions Precautions: Fall Restrictions Weight Bearing Restrictions: No       Mobility Bed Mobility               General bed mobility comments: Pt up in recliner    Transfers Overall transfer level: Needs assistance Equipment used: Rolling walker (2 wheeled) Transfers: Sit to/from Stand Sit to Stand: Supervision              Balance Overall balance assessment: Needs assistance Sitting-balance support: Feet supported;Single extremity supported Sitting balance-Leahy Scale: Good     Standing balance support: No upper extremity supported Standing balance-Leahy Scale: Fair Standing balance comment: fair statically, safer with RW.                           ADL either performed or assessed with clinical judgement   ADL Overall ADL's : Needs assistance/impaired      Grooming: Wash/dry hands;Standing;Oral care;Minimal assistance;Min guard;Brushing hair;Supervision/safety Grooming Details (indicate cue type and reason): Min As to thoroughly comb hair, otherwise pt supervision to stand at sink for oral care and face/hand washing, with cues for positioning of RW for safety.                 Toilet Transfer: Wellsite geologist Details (indicate cue type and reason): Pt usde RW to ambulate from Recliner to bathroom ~15'. BSC placed on top of toilet and pt able to perform [facility] transfer with supervision. Toileting- Architect and Hygiene: Min guard;Sit to/from stand;Sitting/lateral lean Toileting - Clothing Manipulation Details (indicate cue type and reason): Min Guard for clothing management. Pt able to perform her own hygiene with anterior leans and supervision.     Functional mobility during ADLs: Min guard;Rolling walker;Supervision/safety       Vision   Vision Assessment?: No apparent visual deficits   Perception     Praxis      Cognition Arousal/Alertness: Awake/alert Behavior During Therapy: WFL for tasks assessed/performed Overall Cognitive Status: Within Functional Limits for tasks assessed                                 General Comments: A&Ox4.        Exercises Other Exercises Other Exercises: Pt educated on chair or bed level simple AROM exercises to Bil LEs and UEs. Pt instructed to move arms or legs to comfort until a little more  tired then when started, then taking shorter but more frequent breaks. Pt verbalized understanding. Did not initiate exercises this visit due to fatigue post ADLs and functional mobility.   Shoulder Instructions       General Comments      Pertinent Vitals/ Pain       Pain Assessment: No/denies pain  Home Living                                          Prior Functioning/Environment              Frequency  Min  2X/week        Progress Toward Goals  OT Goals(current goals can now be found in the care plan section)  Progress towards OT goals: Progressing toward goals  Acute Rehab OT Goals Patient Stated Goal: go home OT Goal Formulation: With patient/family Time For Goal Achievement: 09/05/20 Potential to Achieve Goals: Good  Plan Discharge plan needs to be updated    Co-evaluation                 AM-PAC OT "6 Clicks" Daily Activity     Outcome Measure   Help from another person eating meals?: None Help from another person taking care of personal grooming?: A Little Help from another person toileting, which includes using toliet, bedpan, or urinal?: A Little Help from another person bathing (including washing, rinsing, drying)?: A Lot Help from another person to put on and taking off regular upper body clothing?: A Little Help from another person to put on and taking off regular lower body clothing?: A Lot 6 Click Score: 17    End of Session Equipment Utilized During Treatment: Rolling walker;Gait belt  OT Visit Diagnosis: Unsteadiness on feet (R26.81);Other abnormalities of gait and mobility (R26.89)   Activity Tolerance Patient tolerated treatment well   Patient Left in chair;with call bell/phone within reach;with family/visitor present   Nurse Communication Mobility status        Time: 0932-1000 OT Time Calculation (min): 28 min  Charges: OT General Charges $OT Visit: 1 Visit OT Treatments $Self Care/Home Management : 8-22 mins $Therapeutic Activity: 8-22 mins  Victorino Dike, OT Acute Rehab Services Office: [phone] 08/25/2020   Theodoro Clock 08/25/2020, 10:12 AM

## 2020-08-25 NOTE — Progress Notes (Signed)
PROGRESS NOTE                                                                                                                                                                                                             Patient Demographics:    [name], is a 80 y.o. female, DOB - February 10, 1940, ZOX:[id number]  Outpatient Primary MD for the patient is Georgianne Fick, MD    LOS - 4  Admit date - 08/20/2020    Chief Complaint  Patient presents with   Altered Mental Status       Brief Narrative (HPI from H&P) - [name] is a 80 y.o. female with medical history significant of insulin-dependent type 2 diabetes, obesity (BMI 41.10), history of TIA, history of pericarditis, hypertension, hyperlipidemia, OSA on CPAP, hypothyroidism, depression, bipolar disorder was brought in by EMS for some confusion along with generalized abdominal pain and shortness of breath.  In the ER she was found to have possible pneumonia and fluid overload and admitted.   Subjective:   Patient in bed, appears comfortable, denies any headache, no fever, no chest pain or pressure, no shortness of breath , no abdominal pain, mild diarrhea x 2  No new focal weakness.   Assessment  & Plan :    Subacute generalized abdominal pain along with orthopnea, shortness of breath and massive fluid overload - her abdominal exam is benign and CT scan abdomen pelvis with oral and IV contrast is nonacute except for fatty liver, she does have diverticulosis and some constipation.    Most of her issues seem to be due to fluid overload and responded very well to diuresis with Lasix most of her symptoms have resolved, breathing is improved quite a bit continue diuresis with Lasix and monitor closely.  2.  Acute on chronic diastolic CHF recent EF around 81%.  Continue diuresis, fluid restriction and monitor mild elevation of troponin due to CHF decompensation.  No  chest pain.  EKG nonacute.  Monitor.  3.  History of TIA.  On aspirin and statin for secondary prevention.  4.  Hypertension.  On calcium channel blocker.  Continue.  5.  Dyslipidemia.  On statin.  6. Morbid Obesity.  BMI of 41.  Follow with PCP for weight loss. CPAP nightly for OSA.  7.  Questionable pneumonia.  Clinically doubt she  has pneumonia, has finished ABX, clinically resolved.  8. Hypokalemia.  Replaced.    9. Mild Diarrhea x 2 - no fever, stable WBC, hold colace and monitor.  10. DM type II.  Placed on Lantus and sliding scale monitor  Lab Results  Component Value Date   HGBA1C 7.8 (H) 07/04/2020   CBG (last 3)  Recent Labs    08/24/20 2026 08/25/20 0728 08/25/20 1222  GLUCAP 196* 207* 204*         Condition - Fair  Family Communication  : None bedside on 08/22/2020, 08/24/20, 08/25/20  Code Status :  Full  Consults  :  None  PUD Prophylaxis : PPI   Procedures  :     CT scan abdomen pelvis with oral IV contrast.  Fatty liver and diverticulosis.  Echocardiogram. 05/20/20 -  1. Left ventricular ejection fraction, by estimation, is 70 to 75%. The left ventricle has hyperdynamic function. The left ventricle has no regional wall motion abnormalities. There is moderate concentric left ventricular hypertrophy. Left ventricular diastolic parameters are consistent with Grade I diastolic dysfunction  (impaired relaxation).   2. Right ventricular systolic function is normal. The right ventricular size is normal. Tricuspid regurgitation signal is inadequate for assessing PA pressure.   3. The mitral valve is degenerative. Trivial mitral valve regurgitation. No evidence of mitral stenosis.   4. The aortic valve is tricuspid. There is mild calcification of the aortic valve. Aortic valve regurgitation is not visualized. No aortic stenosis is present.       Disposition Plan  :    Status is: Inpatient  Remains inpatient appropriate because:IV treatments appropriate due  to intensity of illness or inability to take PO  Dispo: The patient is from: Home              Anticipated d/c is to: Home              Patient currently is not medically stable to d/c.   Difficult to place patient No   DVT Prophylaxis  :    Place TED hose Start: 08/22/20 0951 Place TED hose Start: 08/21/20 0825 enoxaparin (LOVENOX) injection 40 mg Start: 08/21/20 0600  Lab Results  Component Value Date   PLT 307 08/25/2020    Diet :  Diet Order             DIET SOFT Room service appropriate? No; Fluid consistency: Thin  Diet effective now                    Inpatient Medications  Scheduled Meds:  aspirin EC  81 mg Oral Daily   carbamazepine  200 mg Oral BID   diltiazem  120 mg Oral QHS   enoxaparin (LOVENOX) injection  40 mg Subcutaneous Q24H   folic acid  1 mg Oral Daily   insulin aspart  0-15 Units Subcutaneous TID WC   insulin aspart  0-5 Units Subcutaneous QHS   insulin glargine-yfgn  15 Units Subcutaneous QHS   isosorbide mononitrate  15 mg Oral Daily   levothyroxine  75 mcg Oral QAC breakfast   pantoprazole  40 mg Oral Daily   pravastatin  20 mg Oral Daily   QUEtiapine  100 mg Oral QHS   Continuous Infusions:   PRN Meds:.acetaminophen **OR** [DISCONTINUED] acetaminophen, albuterol, ondansetron (ZOFRAN) IV, traMADol  Antibiotics  :    Anti-infectives (From admission, onward)    Start     Dose/Rate Route Frequency Ordered Stop   08/21/20 2230  azithromycin (ZITHROMAX) 500 mg in sodium chloride 0.9 % 250 mL IVPB        500 mg 250 mL/hr over 60 Minutes Intravenous Every 24 hours 08/21/20 0259 08/23/20 2332   08/21/20 2230  cefTRIAXone (ROCEPHIN) 1 g in sodium chloride 0.9 % 100 mL IVPB  Status:  Discontinued        1 g 200 mL/hr over 30 Minutes Intravenous Every 24 hours 08/21/20 0259 08/25/20 0917   08/20/20 2230  cefTRIAXone (ROCEPHIN) 1 g in sodium chloride 0.9 % 100 mL IVPB        1 g 200 mL/hr over 30 Minutes Intravenous  Once 08/20/20 2228  08/20/20 2345   08/20/20 2230  azithromycin (ZITHROMAX) 500 mg in sodium chloride 0.9 % 250 mL IVPB        500 mg 250 mL/hr over 60 Minutes Intravenous  Once 08/20/20 2228 08/20/20 2359        Time Spent in minutes  30   Susa Raring M.D on 08/25/2020 at 1:19 PM  To page go to [email or web address]   [facility] -  Office  [phone]    See all Orders from today for further details    Objective:   Vitals:   08/25/20 0400 08/25/20 0720 08/25/20 1005 08/25/20 1228  BP: 137/73 (!) 144/63  (!) 120/59  Pulse: 76 81 86 91  Resp: 17 19 16 18   Temp: 98 F (36.7 C) 98 F (36.7 C)  98.6 F (37 C)  TempSrc: Oral Oral  Oral  SpO2: 94% 92% 95% 94%  Weight:      Height:        Wt Readings from Last 3 Encounters:  08/21/20 105.2 kg  07/10/20 108.6 kg  06/13/20 107 kg     Intake/Output Summary (Last 24 hours) at 08/25/2020 1319 Last data filed at 08/25/2020 1227 Gross per 24 hour  Intake 360 ml  Output 1100 ml  Net -740 ml     Physical Exam  Awake Alert, No new F.N deficits, Normal affect [place].AT,PERRAL Supple Neck,No JVD, No cervical lymphadenopathy appriciated.  Symmetrical Chest wall movement, Good air movement bilaterally, CTAB RRR,No Gallops, Rubs or new Murmurs, No Parasternal Heave +ve B.Sounds, Abd Soft, No tenderness, No organomegaly appriciated, No rebound - guarding or rigidity. No Cyanosis, trace edema - TEDs    Data Review:    CBC Recent Labs  Lab 08/20/20 2116 08/22/20 0205 08/23/20 0033 08/25/20 1020  WBC 8.6 7.4 6.0 5.5  HGB 13.4 12.6 13.1 14.0  HCT 40.5 38.6 39.4 42.6  PLT 261 215 211 307  MCV 91.6 91.7 90.6 91.2  MCH 30.3 29.9 30.1 30.0  MCHC 33.1 32.6 33.2 32.9  RDW 14.6 14.6 14.4 14.1  LYMPHSABS 1.2 1.6 1.4  --   MONOABS 1.1* 1.3* 1.1*  --   EOSABS 0.2 0.1 0.2  --   BASOSABS 0.0 0.0 0.0  --     Recent Labs  Lab 08/20/20 2116 08/20/20 2117 08/20/20 2118 08/20/20 2316 08/21/20 0344 08/22/20 0205 08/22/20 0733  08/23/20 0033 08/24/20 0046 08/25/20 0300  NA 130*  --   --   --  139 139  --  136 135  --   K 3.9  --   --   --  4.2 3.5  --  3.9 3.4* 4.3  CL 96*  --   --   --  104 100  --  100 101  --   CO2 24  --   --   --  26 31  --  27 25  --   GLUCOSE 209*  --   --   --  141* 148*  --  157* 174*  --   BUN 20  --   --   --  17 13  --  13 10  --   CREATININE 1.10*  --   --   --  1.01* 0.96  --  0.88 0.81  --   CALCIUM 8.3*  --   --   --  8.2* 8.2*  --  8.8* 8.8*  --   AST 22  --   --   --   --  19  --  28  --   --   ALT 31  --   --   --   --  25  --  25  --   --   ALKPHOS 76  --   --   --   --  69  --  70  --   --   BILITOT 0.6  --   --   --   --  0.4  --  0.5  --   --   ALBUMIN 3.4*  --   --   --   --  3.0*  --  3.0*  --   --   MG  --   --   --   --   --  2.1  --  2.1  --   --   CRP  --   --   --   --   --  20.2*  --  19.0*  --   --   PROCALCITON  --   --   --   --   --   --  0.11 0.11  --   --   LATICACIDVEN 3.1*  --   --  2.3* 1.9  --   --   --   --   --   TSH  --  1.526  --   --   --   --   --   --   --   --   AMMONIA  --  29  --   --   --   --   --   --   --   --   BNP  --   --  186.8*  --   --  134.7*  --  69.9  --   --     ------------------------------------------------------------------------------------------------------------------ No results for input(s): CHOL, HDL, LDLCALC, TRIG, CHOLHDL, LDLDIRECT in the last 72 hours.  Lab Results  Component Value Date   HGBA1C 7.8 (H) 07/04/2020   ------------------------------------------------------------------------------------------------------------------ No results for input(s): TSH, T4TOTAL, T3FREE, THYROIDAB in the last 72 hours.  Invalid input(s): FREET3   Cardiac Enzymes No results for input(s): CKMB, TROPONINI, MYOGLOBIN in the last 168 hours.  Invalid input(s): CK ------------------------------------------------------------------------------------------------------------------    Component Value Date/Time   BNP 69.9  08/23/2020 0033    Radiology Reports CT ABDOMEN PELVIS WO CONTRAST  Result Date: 08/20/2020 CLINICAL DATA:  Acute nonlocalized abdominal pain EXAM: CT ABDOMEN AND PELVIS WITHOUT CONTRAST TECHNIQUE: Multidetector CT imaging of the abdomen and pelvis was performed following the standard protocol without IV contrast. COMPARISON:  10/16/2015 FINDINGS: Lower chest: Dependent consolidation within the left lower lobe likely reflects atelectasis. No effusion or pneumothorax. Hepatobiliary: There is diffuse hepatic steatosis. No focal liver abnormality on this unenhanced exam. The gallbladder is surgically absent. Pancreas: Unremarkable. No pancreatic ductal  dilatation or surrounding inflammatory changes. Spleen: Normal in size without focal abnormality. Adrenals/Urinary Tract: Chronic thickening of the left adrenal gland consistent with adenoma or hyperplasia. The right adrenals unremarkable. No urinary tract calculi or obstructive uropathy within either kidney. The bladder is unremarkable. Stomach/Bowel: No bowel obstruction or ileus. The appendix is surgically absent. Diverticulosis of the distal colon without diverticulitis. No bowel wall thickening or inflammatory change. Small hiatal hernia. Vascular/Lymphatic: Aortic atherosclerosis. No enlarged abdominal or pelvic lymph nodes. Reproductive: Status post hysterectomy. No adnexal masses. Other: No free fluid or free gas.  No abdominal wall hernia. Musculoskeletal: No acute or destructive bony lesions. Reconstructed images demonstrate no additional findings. IMPRESSION: 1. Diverticulosis without diverticulitis. 2. Hepatic steatosis. 3. Small hiatal hernia. 4.  Aortic Atherosclerosis (ICD10-I70.0). Electronically Signed   By: Sharlet Salina M.D.   On: 08/20/2020 23:37   DG Abd 1 View  Result Date: 08/21/2020 CLINICAL DATA:  Abdominal pain EXAM: ABDOMEN - 1 VIEW COMPARISON:  10/01/2019 FINDINGS: The bowel gas pattern is normal. No radio-opaque calculi or other  significant radiographic abnormality are seen. Surgical clips right upper quadrant IMPRESSION: Negative. Electronically Signed   By: Marlan Palau M.D.   On: 08/21/2020 08:29   CT HEAD WO CONTRAST ( )  Result Date: 08/20/2020 CLINICAL DATA:  Mental status change, unknown cause EXAM: CT HEAD WITHOUT CONTRAST TECHNIQUE: Contiguous axial images were obtained from the base of the skull through the vertex without intravenous contrast. COMPARISON:  MRI 06/23/2017 FINDINGS: Brain: Small foci of macroscopic fat within the falx are compatible with tiny dermoids measuring up to 9 mm. Relatively mild parenchymal volume loss given the patient's age. Mild periventricular white matter changes are present likely reflecting the sequela of small vessel ischemia. No abnormal intra or extra-axial mass lesion or fluid collection. No abnormal mass effect or midline shift. No evidence of acute intracranial hemorrhage or infarct. Ventricular size is normal. Cerebellum unremarkable. Vascular: Unremarkable Skull: Intact Sinuses/Orbits: Paranasal sinuses are clear. Right scleral banding has been performed. Orbits are otherwise unremarkable. Other: Mastoid air cells and middle ear cavities are clear. IMPRESSION: No acute intracranial abnormality. Mild senescent change. Electronically Signed   By: Helyn Numbers MD   On: 08/20/2020 23:45   CT ABDOMEN PELVIS W CONTRAST  Result Date: 08/21/2020 CLINICAL DATA:  Abdominal pain, fever EXAM: CT ABDOMEN AND PELVIS WITH CONTRAST TECHNIQUE: Multidetector CT imaging of the abdomen and pelvis was performed using the standard protocol following bolus administration of intravenous contrast. CONTRAST:  80mL OMNIPAQUE IOHEXOL 350 MG/ML SOLN COMPARISON:  08/20/2020 FINDINGS: Lower chest: Bibasilar atelectasis or scarring. Heart is normal size. Hepatobiliary: Prior cholecystectomy. Diffuse fatty infiltration of the liver. No focal hepatic abnormality. Pancreas: No focal abnormality or ductal  dilatation. Spleen: No focal abnormality.  Normal size. Adrenals/Urinary Tract: No adrenal abnormality. No focal renal abnormality. No stones or hydronephrosis. Urinary bladder is unremarkable. Stomach/Bowel: Sigmoid diverticulosis. No active diverticulitis. Stomach and small bowel decompressed, unremarkable. Vascular/Lymphatic: Scattered aortic atherosclerosis. No evidence of aneurysm or adenopathy. Reproductive: Prior hysterectomy.  No adnexal masses. Other: No free fluid or free air. Musculoskeletal: No acute bony abnormality. IMPRESSION: Hepatic steatosis. Bibasilar atelectasis or scarring, stable. Sigmoid diverticulosis.  No active diverticulitis. Aortic atherosclerosis. No acute findings. Electronically Signed   By: Charlett Nose M.D.   On: 08/21/2020 09:16   DG Chest Port 1 View  Result Date: 08/22/2020 CLINICAL DATA:  Shortness of breath. EXAM: PORTABLE CHEST 1 VIEW COMPARISON:  08/21/2020 FINDINGS: Heart size and mediastinal contours are unremarkable. No pleural  effusion or edema identified. No airspace densities. Visualized osseous structures are unremarkable. IMPRESSION: No acute cardiopulmonary abnormalities. Electronically Signed   By: Signa Kellaylor  Stroud M.D.   On: 08/22/2020 08:38   DG Chest Port 1 View  Result Date: 08/21/2020 CLINICAL DATA:  Short of breath, abdominal pain EXAM: PORTABLE CHEST 1 VIEW COMPARISON:  08/20/2020 FINDINGS: Hypoventilation with mild atelectasis in the lung bases. Left lower lobe airspace disease unchanged. Cardiac enlargement with mild vascular congestion. Negative for edema or effusion. IMPRESSION: Hypoventilation with bibasilar atelectasis. Mild left lower lobe airspace disease unchanged Mild vascular congestion without edema. Electronically Signed   By: Marlan Palauharles  Clark M.D.   On: 08/21/2020 08:18   DG Chest Portable 1 View  Result Date: 08/20/2020 CLINICAL DATA:  Dyspnea EXAM: PORTABLE CHEST 1 VIEW COMPARISON:  09/05/2018 FINDINGS: Lung volumes are small and there is  resultant vascular crowding at the hila. There is superimposed asymmetric interstitial pulmonary infiltrate within the left mid and lower lung zone, infection versus asymmetric pulmonary edema. No pneumothorax or pleural effusion. Cardiac size within normal limits. No acute bone abnormality. IMPRESSION: Asymmetric left mid and lower lung zone pulmonary infiltrate, possibly infectious in the appropriate clinical setting. Pulmonary hypoinflation. Electronically Signed   By: Helyn NumbersAshesh  Parikh MD   On: 08/20/2020 21:49

## 2020-08-25 NOTE — Progress Notes (Addendum)
Physical Therapy Treatment Patient Details Name: [name] MRN: [id number] DOB: [birth year] Today's Date: 08/25/2020    History of Present Illness 80yo female admitted 08/20/20 with AMS. CTH negative. Found to be in sepsis secondary to CAP. PMH bipolar, HLD, HTN, TIA, DM, vertigo, urinary incontinence    PT Comments    Patient received in recliner, pleasant but reports she is not feeling well today due to ongoing diarrhea. Used brief as a precaution, then walked laps in her room per her request due to the diarrhea- steady with RW, just with slow gait speed and easily fatigued. VSS on RA. Left in bed with all needs met, bed alarm active. Will continue to follow, continue to recommend OP PT.     Follow Up Recommendations  Outpatient PT;Supervision for mobility/OOB     Equipment Recommendations  Rolling walker with 5" wheels;3in1 (PT)    Recommendations for Other Services       Precautions / Restrictions Precautions Precautions: Fall Restrictions Weight Bearing Restrictions: No    Mobility  Bed Mobility Overal bed mobility: Needs Assistance Bed Mobility: Sit to Supine       Sit to supine: Min guard;HOB elevated   General bed mobility comments: increased time and effort; able to help wiggle up bed in trendelenburg position after getting into supine    Transfers Overall transfer level: Needs assistance Equipment used: Rolling walker (2 wheeled) Transfers: Sit to/from Stand Sit to Stand: Supervision         General transfer comment: cues for hand placement and sequencing, otherwise no physical assist given  Ambulation/Gait Ambulation/Gait assistance: Supervision Gait Distance (Feet): 80 Feet Assistive device: Rolling walker (2 wheeled) Gait Pattern/deviations: Step-through pattern;Decreased step length - right;Decreased step length - left;Trunk flexed Gait velocity: decreased   General Gait Details: laps in room per her request due to ongoing diarrhea. More  easily fatigued today possibly due to diarrhea. VSS on RA.   Stairs             Wheelchair Mobility    Modified Rankin (Stroke Patients Only)       Balance Overall balance assessment: Needs assistance Sitting-balance support: Feet supported;Single extremity supported Sitting balance-Leahy Scale: Good     Standing balance support: No upper extremity supported Standing balance-Leahy Scale: Fair Standing balance comment: fair statically, safer with RW.                            Cognition Arousal/Alertness: Awake/alert Behavior During Therapy: WFL for tasks assessed/performed Overall Cognitive Status: Within Functional Limits for tasks assessed                                 General Comments: A&Ox4.      Exercises      General Comments        Pertinent Vitals/Pain Pain Assessment: No/denies pain Faces Pain Scale: No hurt Pain Intervention(s): Limited activity within patient's tolerance;Monitored during session    Home Living                      Prior Function            PT Goals (current goals can now be found in the care plan section) Acute Rehab PT Goals Patient Stated Goal: go home PT Goal Formulation: With patient/family Time For Goal Achievement: 09/05/20 Potential to Achieve Goals: Good Progress towards PT goals:  Progressing toward goals (slowly)    Frequency    Min 3X/week      PT Plan Current plan remains appropriate    Co-evaluation              AM-PAC PT "6 Clicks" Mobility   Outcome Measure  Help needed turning from your back to your side while in a flat bed without using bedrails?: A Little Help needed moving from lying on your back to sitting on the side of a flat bed without using bedrails?: A Little Help needed moving to and from a bed to a chair (including a wheelchair)?: A Little Help needed standing up from a chair using your arms (e.g., wheelchair or bedside chair)?: A  Little Help needed to walk in hospital room?: A Little Help needed climbing 3-5 steps with a railing? : A Lot 6 Click Score: 17    End of Session   Activity Tolerance: Patient tolerated treatment well Patient left: in bed;with call bell/phone within reach;with bed alarm set Nurse Communication: Mobility status PT Visit Diagnosis: Muscle weakness (generalized) (M62.81);Unsteadiness on feet (R26.81)     Time: 2182-8833 PT Time Calculation (min) (ACUTE ONLY): 19 min  Charges:  $Gait Training: 8-22 mins                    Windell Norfolk, DPT, PN2   Supplemental Physical Therapist [place]    Pager [phone] Acute Rehab Office [phone]

## 2020-08-25 NOTE — Plan of Care (Signed)
  Problem: Education: Goal: Knowledge of General Education information will improve Description: Including pain rating scale, medication(s)/side effects and non-pharmacologic comfort measures 08/25/2020 1339 by Jerral Ralph, RN Outcome: Progressing 08/25/2020 1338 by Jerral Ralph, RN Outcome: Progressing   Problem: Health Behavior/Discharge Planning: Goal: Ability to manage health-related needs will improve 08/25/2020 1339 by Jerral Ralph, RN Outcome: Progressing 08/25/2020 1338 by Jerral Ralph, RN Outcome: Progressing   Problem: Clinical Measurements: Goal: Ability to maintain clinical measurements within normal limits will improve 08/25/2020 1339 by Jerral Ralph, RN Outcome: Progressing 08/25/2020 1338 by Jerral Ralph, RN Outcome: Progressing Goal: Will remain free from infection 08/25/2020 1339 by Jerral Ralph, RN Outcome: Progressing 08/25/2020 1338 by Jerral Ralph, RN Outcome: Progressing Goal: Diagnostic test results will improve 08/25/2020 1339 by Jerral Ralph, RN Outcome: Progressing 08/25/2020 1338 by Jerral Ralph, RN Outcome: Progressing Goal: Respiratory complications will improve 08/25/2020 1339 by Jerral Ralph, RN Outcome: Progressing 08/25/2020 1338 by Jerral Ralph, RN Outcome: Progressing Goal: Cardiovascular complication will be avoided 08/25/2020 1339 by Jerral Ralph, RN Outcome: Progressing 08/25/2020 1338 by Jerral Ralph, RN Outcome: Progressing   Problem: Activity: Goal: Risk for activity intolerance will decrease 08/25/2020 1339 by Jerral Ralph, RN Outcome: Progressing 08/25/2020 1338 by Jerral Ralph, RN Outcome: Progressing   Problem: Nutrition: Goal: Adequate nutrition will be maintained 08/25/2020 1339 by Jerral Ralph, RN Outcome: Progressing 08/25/2020 1338 by Jerral Ralph, RN Outcome: Progressing   Problem: Coping: Goal: Level of anxiety will decrease 08/25/2020 1339 by Jerral Ralph, RN Outcome: Progressing 08/25/2020 1338 by Jerral Ralph, RN Outcome: Progressing   Problem: Elimination: Goal: Will not experience complications related to bowel motility 08/25/2020 1339 by Jerral Ralph, RN Outcome: Progressing 08/25/2020 1338 by Jerral Ralph, RN Outcome: Progressing Goal: Will not experience complications related to urinary retention 08/25/2020 1339 by Jerral Ralph, RN Outcome: Progressing 08/25/2020 1338 by Jerral Ralph, RN Outcome: Progressing   Problem: Pain Managment: Goal: General experience of comfort will improve 08/25/2020 1339 by Jerral Ralph, RN Outcome: Progressing 08/25/2020 1338 by Jerral Ralph, RN Outcome: Progressing   Problem: Safety: Goal: Ability to remain free from injury will improve 08/25/2020 1339 by Jerral Ralph, RN Outcome: Progressing 08/25/2020 1338 by Jerral Ralph, RN Outcome: Progressing   Problem: Skin Integrity: Goal: Risk for impaired skin integrity will decrease 08/25/2020 1339 by Jerral Ralph, RN Outcome: Progressing 08/25/2020 1338 by Jerral Ralph, RN Outcome: Progressing

## 2020-08-25 NOTE — Care Management Important Message (Signed)
Important Message  Patient Details  Name: [name] MRN: [id number] Date of Birth: 1940/05/30   Medicare Important Message Given:  Yes     Dorena Bodo 08/25/2020, 4:46 PM

## 2020-08-25 NOTE — Plan of Care (Signed)

## 2020-08-26 DIAGNOSIS — R0602 Shortness of breath: Secondary | ICD-10-CM

## 2020-08-26 LAB — CULTURE, BLOOD (ROUTINE X 2)
Culture: NO GROWTH
Culture: NO GROWTH
Special Requests: ADEQUATE
Special Requests: ADEQUATE

## 2020-08-26 LAB — GLUCOSE, CAPILLARY
Glucose-Capillary: 203 mg/dL — ABNORMAL HIGH (ref 70–99)
Glucose-Capillary: 251 mg/dL — ABNORMAL HIGH (ref 70–99)

## 2020-08-26 MED ORDER — POTASSIUM CHLORIDE CRYS ER 10 MEQ PO TBCR: 10.0000 meq | EXTENDED_RELEASE_TABLET | Freq: Every day | ORAL | 0 refills | Status: DC

## 2020-08-26 MED ORDER — SPIRONOLACTONE 25 MG PO TABS: 25.0000 mg | ORAL_TABLET | Freq: Every day | ORAL | 0 refills | Status: DC

## 2020-08-26 MED ORDER — IRBESARTAN 75 MG PO TABS
37.5000 mg | ORAL_TABLET | Freq: Every day | ORAL | Status: DC
  Filled 2020-08-26: qty 0.5

## 2020-08-26 MED ORDER — FUROSEMIDE 40 MG PO TABS: 40.0000 mg | ORAL_TABLET | Freq: Two times a day (BID) | ORAL | 0 refills | Status: DC

## 2020-08-26 MED ORDER — METOLAZONE 2.5 MG PO TABS: ORAL_TABLET | ORAL | 0 refills | Status: DC

## 2020-08-26 MED ORDER — FUROSEMIDE 40 MG PO TABS
40.0000 mg | ORAL_TABLET | Freq: Two times a day (BID) | ORAL | Status: DC
  Administered 2020-08-26: 40 mg via ORAL
  Filled 2020-08-26: qty 1

## 2020-08-26 MED ORDER — POTASSIUM CHLORIDE CRYS ER 10 MEQ PO TBCR
10.0000 meq | EXTENDED_RELEASE_TABLET | Freq: Once | ORAL | Status: AC
  Administered 2020-08-26: 10 meq via ORAL
  Filled 2020-08-26: qty 1

## 2020-08-26 NOTE — Plan of Care (Signed)

## 2020-08-26 NOTE — Discharge Summary (Signed)
[name] WKH:[id number] DOB: March 15, 1940 DOA: 08/20/2020  PCP: Merrilee Seashore, MD  Admit date: 08/20/2020  Discharge date: 08/26/2020  Admitted From: Home  Disposition:  Home   Recommendations for Outpatient Follow-up:   Follow up with PCP in 1-2 weeks  PCP Please obtain BMP/CBC, 2 view CXR in 1week,  (see Discharge instructions)   PCP Please follow up on the following pending results: Please monitor her electrolytes, diuretic dose and blood pressure closely   Home Health: PT, RN Equipment/Devices: as below  Consultations: None  Discharge Condition: Stable    CODE STATUS: Full    Diet Recommendation: Heart Healthy carbohydrate with 1.5 L fluid restriction per day.  Chief Complaint  Patient presents with   Altered Mental Status     Brief history of present illness from the day of admission and additional interim summary    [name] is a 80 y.o. female with medical history significant of insulin-dependent type 2 diabetes, obesity (BMI 41.10), history of TIA, history of pericarditis, hypertension, hyperlipidemia, OSA on CPAP, hypothyroidism, depression, bipolar disorder was brought in by EMS for some confusion along with generalized abdominal pain and shortness of breath.  In the ER she was found to have possible pneumonia and fluid overload and admitted.                                                                 Hospital Course     Subacute generalized abdominal pain along with orthopnea, shortness of breath and massive fluid overload - her abdominal exam is benign and CT scan abdomen pelvis with oral and IV contrast is nonacute except for fatty liver, she does have diverticulosis and some constipation.     Most of her issues seem to be due to fluid overload and responded very well to  diuresis with Lasix she has diuresed over 7 L and her symptoms have resolved, breathing is improved quite a bit and she is now symptom-free on room air, her diuretic regimen has been adjusted and she has been placed on fluid restriction, request PCP to monitor her electrolytes, blood pressure medications and diuretic medications closely over the next few weeks.   2.  Acute on chronic diastolic CHF recent EF around 65%.  Now compensated after aggressive diuresis, educated on fluid restriction,  she is now symptom-free on room air, mild elevation of troponin due to CHF decompensation.  No chest pain.  EKG nonacute.  Will have her follow-up with PCP and primary cardiologist in 1 to 2 weeks   3.  History of TIA.  On aspirin and statin for secondary prevention.   4.  Hypertension.  On calcium channel blocker, resume home dose ARB upon discharge.  Currently heart rate stable hence will discontinue low-dose beta-blocker she takes in addition to calcium  channel blocker, PCP to monitor.   5.  Dyslipidemia.  On statin.   6. Morbid Obesity.  BMI of 41.  Follow with PCP for weight loss. CPAP nightly for OSA.   7.  Questionable pneumonia.  Clinically doubt she has pneumonia, has finished ABX, clinically resolved.   8. Hypokalemia.  Replaced.     9. Mild Diarrhea x 2 - no fever, resolved with supportive care.   10. DM type II.  To new home regimen and follow with PCP for glycemic control.  Lab Results  Component Value Date   HGBA1C 7.8 (H) 07/04/2020    Discharge diagnosis     Principal Problem:   CAP (community acquired pneumonia) Active Problems:   Sepsis ([place])   Acute metabolic encephalopathy   Elevated troponin   Abdominal pain    Discharge instructions    Discharge Instructions     Ambulatory referral to Physical Therapy   Complete by: As directed    Discharge instructions   Complete by: As directed    Follow with Primary MD Merrilee Seashore, MD in 5-7 days   Get CBC, CMP,  Magnesium, 2 view Chest X ray -  checked next visit within 5-7 days by Primary MD   Activity: As tolerated with Full fall precautions use walker/cane & assistance as needed  Disposition Home   Diet: Heart Healthy low carbohydrate diet with strict 1.5 L/day total fluid restriction.  Check your sugars q. [facility] S  Check your Weight same time everyday, if you gain over 2 pounds, or you develop in leg swelling, experience more shortness of breath or chest pain, call your Primary MD immediately. Follow Cardiac Low Salt Diet and 1.5 lit/day fluid restriction.  Special Instructions: If you have smoked or chewed Tobacco  in the last 2 yrs please stop smoking, stop any regular Alcohol  and or any Recreational drug use.  On your next visit with your primary care physician please Get Medicines reviewed and adjusted.  Please request your Prim.MD to go over all Hospital Tests and Procedure/Radiological results at the follow up, please get all Hospital records sent to your Prim MD by signing hospital release before you go home.  If you experience worsening of your admission symptoms, develop shortness of breath, life threatening emergency, suicidal or homicidal thoughts you must seek medical attention immediately by calling 911 or calling your MD immediately  if symptoms less severe.  You Must read complete instructions/literature along with all the possible adverse reactions/side effects for all the Medicines you take and that have been prescribed to you. Take any new Medicines after you have completely understood and accpet all the possible adverse reactions/side effects.   Increase activity slowly   Complete by: As directed        Discharge Medications   Allergies as of 08/26/2020       Reactions   Flagyl [metronidazole Hcl] Itching, Rash   Ciprofloxacin Itching, Rash   Metformin And Related Rash   Milk-related Compounds Other (See Comments)   Stomach pains   Other Other (See Comments)   [place]  nuts cause severe facial redness        Medication List     STOP taking these medications    metoprolol tartrate 25 MG tablet Commonly known as: LOPRESSOR   ondansetron 8 MG tablet Commonly known as: Zofran       TAKE these medications    Accu-Chek Guide test strip Generic drug: glucose blood Use 1-4 times daily as needed/instructed  DX E11.9   Accu-Chek Guide w/Device Kit Use 1-4 times daily as needed/directed  DX E11.9   aspirin 81 MG EC tablet Take 1 tablet (81 mg total) by mouth daily.   carbamazepine 200 MG 12 hr capsule Commonly known as: CARBATROL Take 200 mg by mouth 2 (two) times daily.   clobetasol 0.05 % Gel Commonly known as: TEMOVATE Apply 1 application topically 2 (two) times daily as needed (rash).   dapagliflozin propanediol 10 MG Tabs tablet Commonly known as: FARXIGA Take 1 tablet (10 mg total) by mouth daily. Generic only   diltiazem 240 MG 24 hr capsule Commonly known as: CARDIZEM CD Take 1 capsule (240 mg total) by mouth at bedtime. What changed: when to take this   donepezil 10 MG tablet Commonly known as: ARICEPT Take 10 mg by mouth at bedtime.   esomeprazole 40 MG capsule Commonly known as: NexIUM Take 1 capsule (40 mg total) by mouth daily at 12 noon. What changed: when to take this   famotidine 20 MG tablet Commonly known as: PEPCID Take 20 mg by mouth 2 (two) times daily.   folic acid 1 MG tablet Commonly known as: FOLVITE Take 1 mg by mouth daily.   furosemide 40 MG tablet Commonly known as: LASIX Take 1 tablet (40 mg total) by mouth 2 (two) times daily. What changed: when to take this   gabapentin 300 MG capsule Commonly known as: NEURONTIN Take 300 mg by mouth at bedtime.   Insulin Aspart FlexPen 100 UNIT/ML Commonly known as: NOVOLOG INJECT 14 TO 16 UNITS BEFORE MEALS AS DIRECTED What changed:  how much to take how to take this when to take this additional instructions   Insulin Pen Needle 33G X 5 MM  Misc Commonly known as: Comfort EZ Pen Needles 1 each by Does not apply route daily. Use with Tresiba pen   Insulin Pen Needle 32G X 5 MM Misc 1 each by Does not apply route daily. Use with Tyler Aas pen   BD Pen Needle Nano U/F 32G X 4 MM Misc Generic drug: Insulin Pen Needle USE 8 PEN NEEDLES PER DAY   isosorbide mononitrate 30 MG 24 hr tablet Commonly known as: IMDUR Take 1 tablet (30 mg total) by mouth daily.   levothyroxine 75 MCG tablet Commonly known as: SYNTHROID Take 75 mcg by mouth daily before breakfast.   methotrexate 2.5 MG tablet Commonly known as: RHEUMATREX Take 10 mg by mouth every Monday. For rash   metolazone 2.5 MG tablet Commonly known as: ZAROXOLYN Take 1 pill once a week every Wednesday as needed if you have gained more than 2.5 pounds of weight in a week, take an extra pill of 20 mg of potassium that day.   montelukast 5 MG chewable tablet Commonly known as: SINGULAIR Chew 5 mg by mouth at bedtime.   OLANZapine 7.5 MG tablet Commonly known as: ZYPREXA Take 15 mg by mouth at bedtime.   OLANZapine 20 MG tablet Commonly known as: ZYPREXA Take 20 mg by mouth at bedtime.   olmesartan 5 MG tablet Commonly known as: BENICAR Take 5 mg by mouth daily.   OneTouch Delica Lancets 97L Misc USE LANCET TO TEST FIVE TIMES A DAY   Accu-Chek Softclix Lancets lancets Use 1-4 times daily as needed/instructed  DX E11.9   potassium chloride 10 MEQ tablet Commonly known as: KLOR-CON Take 1 tablet (10 mEq total) by mouth daily.   pravastatin 20 MG tablet Commonly known as: PRAVACHOL Take 20 mg by  mouth daily.   QC Vitamin D3 50 MCG (2000 UT) Tabs Generic drug: Cholecalciferol Take 2,000 Units by mouth daily.   QUEtiapine 25 MG tablet Commonly known as: SEROQUEL Take 100 mg by mouth at bedtime.   spironolactone 25 MG tablet Commonly known as: Aldactone Take 1 tablet (25 mg total) by mouth daily.   Tyler Aas FlexTouch 100 UNIT/ML FlexTouch Pen Generic  drug: insulin degludec Inject 32 Units into the skin daily. Takes daily in the AM   V-Go 40 Kit USE 1 KIT DAILY AS DIRECTED   Victoza 18 MG/3ML Sopn Generic drug: liraglutide Inject 1.8 mg daily What changed:  how much to take how to take this when to take this additional instructions               Durable Medical Equipment  (From admission, onward)           Start     Ordered   08/25/20 1319  For home use only DME Walker rolling  Once       Comments: 5 wheel  Question Answer Comment  Walker: With [place] Wheels   Patient needs a walker to treat with the following condition Weakness      08/25/20 1318   08/25/20 1319  For home use only DME 3 n 1  Once        08/25/20 1318             Follow-up Information     [facility]. Schedule an appointment as soon as possible for a visit.   Specialty: Rehabilitation Why: Please call the outpatient pharmacy to schedule an apointment Contact information: [address] [place], [address] [phone] [place]        Merrilee Seashore, MD. Schedule an appointment as soon as possible for a visit in 1 week(s).   Specialty: Internal Medicine Contact information: [address] [place] [address] [phone]         Croitoru, Dani Gobble, MD. Schedule an appointment as soon as possible for a visit in 1 week(s).   Specialty: Cardiology Contact information: [address] [place] [address] [phone]                 Major procedures and Radiology Reports - PLEASE review detailed and final reports thoroughly  -       CT ABDOMEN PELVIS WO CONTRAST  Result Date: 08/20/2020 CLINICAL DATA:  Acute nonlocalized abdominal pain EXAM: CT ABDOMEN AND PELVIS WITHOUT CONTRAST TECHNIQUE: Multidetector CT imaging of the abdomen and pelvis was performed following the standard protocol without IV contrast.  COMPARISON:  10/16/2015 FINDINGS: Lower chest: Dependent consolidation within the left lower lobe likely reflects atelectasis. No effusion or pneumothorax. Hepatobiliary: There is diffuse hepatic steatosis. No focal liver abnormality on this unenhanced exam. The gallbladder is surgically absent. Pancreas: Unremarkable. No pancreatic ductal dilatation or surrounding inflammatory changes. Spleen: Normal in size without focal abnormality. Adrenals/Urinary Tract: Chronic thickening of the left adrenal gland consistent with adenoma or hyperplasia. The right adrenals unremarkable. No urinary tract calculi or obstructive uropathy within either kidney. The bladder is unremarkable. Stomach/Bowel: No bowel obstruction or ileus. The appendix is surgically absent. Diverticulosis of the distal colon without diverticulitis. No bowel wall thickening or inflammatory change. Small hiatal hernia. Vascular/Lymphatic: Aortic atherosclerosis. No enlarged abdominal or pelvic lymph nodes. Reproductive: Status post hysterectomy. No adnexal masses. Other: No free fluid or free gas.  No abdominal wall hernia. Musculoskeletal:  No acute or destructive bony lesions. Reconstructed images demonstrate no additional findings. IMPRESSION: 1. Diverticulosis without diverticulitis. 2. Hepatic steatosis. 3. Small hiatal hernia. 4.  Aortic Atherosclerosis (ICD10-I70.0). Electronically Signed   By: Randa Ngo M.D.   On: 08/20/2020 23:37   DG Abd 1 View  Result Date: 08/21/2020 CLINICAL DATA:  Abdominal pain EXAM: ABDOMEN - 1 VIEW COMPARISON:  10/01/2019 FINDINGS: The bowel gas pattern is normal. No radio-opaque calculi or other significant radiographic abnormality are seen. Surgical clips right upper quadrant IMPRESSION: Negative. Electronically Signed   By: Franchot Gallo M.D.   On: 08/21/2020 08:29   CT HEAD WO CONTRAST (5MM)  Result Date: 08/20/2020 CLINICAL DATA:  Mental status change, unknown cause EXAM: CT HEAD WITHOUT CONTRAST TECHNIQUE:  Contiguous axial images were obtained from the base of the skull through the vertex without intravenous contrast. COMPARISON:  MRI 06/23/2017 FINDINGS: Brain: Small foci of macroscopic fat within the falx are compatible with tiny dermoids measuring up to 9 mm. Relatively mild parenchymal volume loss given the patient's age. Mild periventricular white matter changes are present likely reflecting the sequela of small vessel ischemia. No abnormal intra or extra-axial mass lesion or fluid collection. No abnormal mass effect or midline shift. No evidence of acute intracranial hemorrhage or infarct. Ventricular size is normal. Cerebellum unremarkable. Vascular: Unremarkable Skull: Intact Sinuses/Orbits: Paranasal sinuses are clear. Right scleral banding has been performed. Orbits are otherwise unremarkable. Other: Mastoid air cells and middle ear cavities are clear. IMPRESSION: No acute intracranial abnormality. Mild senescent change. Electronically Signed   By: Fidela Salisbury MD   On: 08/20/2020 23:45   CT ABDOMEN PELVIS W CONTRAST  Result Date: 08/21/2020 CLINICAL DATA:  Abdominal pain, fever EXAM: CT ABDOMEN AND PELVIS WITH CONTRAST TECHNIQUE: Multidetector CT imaging of the abdomen and pelvis was performed using the standard protocol following bolus administration of intravenous contrast. CONTRAST:  52m OMNIPAQUE IOHEXOL 350 MG/ML SOLN COMPARISON:  08/20/2020 FINDINGS: Lower chest: Bibasilar atelectasis or scarring. Heart is normal size. Hepatobiliary: Prior cholecystectomy. Diffuse fatty infiltration of the liver. No focal hepatic abnormality. Pancreas: No focal abnormality or ductal dilatation. Spleen: No focal abnormality.  Normal size. Adrenals/Urinary Tract: No adrenal abnormality. No focal renal abnormality. No stones or hydronephrosis. Urinary bladder is unremarkable. Stomach/Bowel: Sigmoid diverticulosis. No active diverticulitis. Stomach and small bowel decompressed, unremarkable. Vascular/Lymphatic:  Scattered aortic atherosclerosis. No evidence of aneurysm or adenopathy. Reproductive: Prior hysterectomy.  No adnexal masses. Other: No free fluid or free air. Musculoskeletal: No acute bony abnormality. IMPRESSION: Hepatic steatosis. Bibasilar atelectasis or scarring, stable. Sigmoid diverticulosis.  No active diverticulitis. Aortic atherosclerosis. No acute findings. Electronically Signed   By: KRolm BaptiseM.D.   On: 08/21/2020 09:16   DG Chest Port 1 View  Result Date: 08/22/2020 CLINICAL DATA:  Shortness of breath. EXAM: PORTABLE CHEST 1 VIEW COMPARISON:  08/21/2020 FINDINGS: Heart size and mediastinal contours are unremarkable. No pleural effusion or edema identified. No airspace densities. Visualized osseous structures are unremarkable. IMPRESSION: No acute cardiopulmonary abnormalities. Electronically Signed   By: TKerby MoorsM.D.   On: 08/22/2020 08:38   DG Chest Port 1 View  Result Date: 08/21/2020 CLINICAL DATA:  Short of breath, abdominal pain EXAM: PORTABLE CHEST 1 VIEW COMPARISON:  08/20/2020 FINDINGS: Hypoventilation with mild atelectasis in the lung bases. Left lower lobe airspace disease unchanged. Cardiac enlargement with mild vascular congestion. Negative for edema or effusion. IMPRESSION: Hypoventilation with bibasilar atelectasis. Mild left lower lobe airspace disease unchanged Mild vascular congestion without edema. Electronically  Signed   By: Franchot Gallo M.D.   On: 08/21/2020 08:18   DG Chest Portable 1 View  Result Date: 08/20/2020 CLINICAL DATA:  Dyspnea EXAM: PORTABLE CHEST 1 VIEW COMPARISON:  09/05/2018 FINDINGS: Lung volumes are small and there is resultant vascular crowding at the hila. There is superimposed asymmetric interstitial pulmonary infiltrate within the left mid and lower lung zone, infection versus asymmetric pulmonary edema. No pneumothorax or pleural effusion. Cardiac size within normal limits. No acute bone abnormality. IMPRESSION: Asymmetric left mid and  lower lung zone pulmonary infiltrate, possibly infectious in the appropriate clinical setting. Pulmonary hypoinflation. Electronically Signed   By: Fidela Salisbury MD   On: 08/20/2020 21:49      Today   Subjective    Jackey Loge today has no headache,no chest abdominal pain,no new weakness tingling or numbness, feels much better wants to go home today.     Objective   Blood pressure 150/90 pulse 89, temperature 98.7 F (37.1 C), temperature source Axillary, resp. rate 19, height 5' 3"  (1.6 m), weight 105.2 kg, SpO2 96 %.   Intake/Output Summary (Last 24 hours) at 08/26/2020 1005 Last data filed at 08/26/2020 0834 Gross per 24 hour  Intake 660 ml  Output 2250 ml  Net -1590 ml    Exam  Awake Alert, No new F.N deficits, Normal affect [place].AT,PERRAL Supple Neck,No JVD, No cervical lymphadenopathy appriciated.  Symmetrical Chest wall movement, Good air movement bilaterally, CTAB RRR,No Gallops,Rubs or new Murmurs, No Parasternal Heave +ve B.Sounds, Abd Soft, Non tender, No organomegaly appriciated, No rebound -guarding or rigidity. No Cyanosis, Clubbing , trace edema   Data Review   CBC w Diff:  Lab Results  Component Value Date   WBC 5.5 08/25/2020   HGB 14.0 08/25/2020   HCT 42.6 08/25/2020   PLT 307 08/25/2020   LYMPHOPCT 23 08/23/2020   MONOPCT 19 08/23/2020   EOSPCT 4 08/23/2020   BASOPCT 1 08/23/2020    CMP:  Lab Results  Component Value Date   NA 135 08/24/2020   NA 140 05/01/2020   K 4.3 08/25/2020   CL 101 08/24/2020   CO2 25 08/24/2020   BUN 10 08/24/2020   BUN 18 05/01/2020   CREATININE 0.81 08/24/2020   CREATININE 0.90 12/23/2014   PROT 6.4 (L) 08/23/2020   ALBUMIN 3.0 (L) 08/23/2020   BILITOT 0.5 08/23/2020   ALKPHOS 70 08/23/2020   AST 28 08/23/2020   ALT 25 08/23/2020  .   Total Time in preparing paper work, data evaluation and todays exam - 47 minutes  Lala Lund M.D on 08/26/2020 at 10:05 AM  [facility]

## 2020-08-26 NOTE — Progress Notes (Signed)
Patient and family educated on medication regimen and follow up appointments. IV removed without complications. All personal items gathered and sent home with patient. Son had concerns about not having home health as a service. This RN contacted Case Management and Home Health was initiated. Patient and family escorted to main lobby pick up via wheelchair by the nurse aid . No questions or concerns stated.

## 2020-08-26 NOTE — TOC Transition Note (Signed)
Transition of Care [facility]) - CM/SW Discharge Note   Patient Details  Name: [name] MRN: [id number] Date of Birth: 1940-08-15  [facility] CM/SW Contact:  Durenda Guthrie, RN Phone Number: 08/26/2020, 1:04 PM   Clinical Narrative:  Case manager spoke with patient and her son, [name] concerning discharge needs. [name] stated that his mom is too weak to go to outpatient therapy and he wants Home Health arranged. He states they have used [facility] in the past and wishes to do so now. CM called referral to Kennyth Arnold, Liaison for [facility]. Patient has necessary DME at home, Joni Fears states that he will be staying with his mom as she recovers. Case Manager has addressed all concerns, informed him to have RN call if he has further questions.    Final next level of care: Home w Home Health Services Barriers to Discharge: No Barriers Identified   Patient Goals and CMS Choice   CMS Medicare.gov Compare Post Acute Care list provided to:: Patient Choice offered to / list presented to : Adult Children  Discharge Placement                       Discharge Plan and Services   Discharge Planning Services: CM Consult Post Acute Care Choice: Home Health          DME Arranged: N/A DME Agency: NA       HH Arranged: PT, OT HH Agency: [facility] Date HH Agency Contacted: 08/26/20 Time HH Agency Contacted: 1303 Representative spoke with at [facility] Agency: [place]  Social Determinants of Health (SDOH) Interventions     Readmission Risk Interventions No flowsheet data found.

## 2020-08-26 NOTE — Discharge Instructions (Addendum)
Follow with Primary MD Georgianne Fick, MD in 5-7 days   Get CBC, CMP, Magnesium, 2 view Chest X ray -  checked next visit within 5-7 days by Primary MD   Activity: As tolerated with Full fall precautions use walker/cane & assistance as needed  Disposition Home   Diet: Heart Healthy low carbohydrate diet with strict 1.5 L/day total fluid restriction.  Check your sugars q. [facility] S  Check your Weight same time everyday, if you gain over 2 pounds, or you develop in leg swelling, experience more shortness of breath or chest pain, call your Primary MD immediately. Follow Cardiac Low Salt Diet and 1.5 lit/day fluid restriction.  Special Instructions: If you have smoked or chewed Tobacco  in the last 2 yrs please stop smoking, stop any regular Alcohol  and or any Recreational drug use.  On your next visit with your primary care physician please Get Medicines reviewed and adjusted.  Please request your Prim.MD to go over all Hospital Tests and Procedure/Radiological results at the follow up, please get all Hospital records sent to your Prim MD by signing hospital release before you go home.  If you experience worsening of your admission symptoms, develop shortness of breath, life threatening emergency, suicidal or homicidal thoughts you must seek medical attention immediately by calling 911 or calling your MD immediately  if symptoms less severe.  You Must read complete instructions/literature along with all the possible adverse reactions/side effects for all the Medicines you take and that have been prescribed to you. Take any new Medicines after you have completely understood and accpet all the possible adverse reactions/side effects.

## 2020-08-27 DIAGNOSIS — E871 Hypo-osmolality and hyponatremia: Secondary | ICD-10-CM | POA: Diagnosis not present

## 2020-08-27 DIAGNOSIS — I200 Unstable angina: Secondary | ICD-10-CM | POA: Diagnosis not present

## 2020-08-27 DIAGNOSIS — E785 Hyperlipidemia, unspecified: Secondary | ICD-10-CM | POA: Diagnosis not present

## 2020-08-27 DIAGNOSIS — E063 Autoimmune thyroiditis: Secondary | ICD-10-CM | POA: Diagnosis not present

## 2020-08-27 DIAGNOSIS — E119 Type 2 diabetes mellitus without complications: Secondary | ICD-10-CM | POA: Diagnosis not present

## 2020-08-27 DIAGNOSIS — I110 Hypertensive heart disease with heart failure: Secondary | ICD-10-CM | POA: Diagnosis not present

## 2020-08-27 DIAGNOSIS — G4733 Obstructive sleep apnea (adult) (pediatric): Secondary | ICD-10-CM | POA: Diagnosis not present

## 2020-08-27 DIAGNOSIS — I5033 Acute on chronic diastolic (congestive) heart failure: Secondary | ICD-10-CM | POA: Diagnosis not present

## 2020-08-31 DIAGNOSIS — G4733 Obstructive sleep apnea (adult) (pediatric): Secondary | ICD-10-CM | POA: Diagnosis not present

## 2020-08-31 DIAGNOSIS — E063 Autoimmune thyroiditis: Secondary | ICD-10-CM | POA: Diagnosis not present

## 2020-08-31 DIAGNOSIS — I110 Hypertensive heart disease with heart failure: Secondary | ICD-10-CM | POA: Diagnosis not present

## 2020-08-31 DIAGNOSIS — E785 Hyperlipidemia, unspecified: Secondary | ICD-10-CM | POA: Diagnosis not present

## 2020-08-31 DIAGNOSIS — I5033 Acute on chronic diastolic (congestive) heart failure: Secondary | ICD-10-CM | POA: Diagnosis not present

## 2020-08-31 DIAGNOSIS — I200 Unstable angina: Secondary | ICD-10-CM | POA: Diagnosis not present

## 2020-08-31 DIAGNOSIS — E871 Hypo-osmolality and hyponatremia: Secondary | ICD-10-CM | POA: Diagnosis not present

## 2020-08-31 DIAGNOSIS — E119 Type 2 diabetes mellitus without complications: Secondary | ICD-10-CM | POA: Diagnosis not present

## 2020-09-01 ENCOUNTER — Telehealth: Payer: Self-pay | Admitting: Cardiovascular Disease

## 2020-09-01 ENCOUNTER — Other Ambulatory Visit: Payer: Self-pay | Admitting: Endocrinology

## 2020-09-01 DIAGNOSIS — E871 Hypo-osmolality and hyponatremia: Secondary | ICD-10-CM | POA: Diagnosis not present

## 2020-09-01 DIAGNOSIS — E785 Hyperlipidemia, unspecified: Secondary | ICD-10-CM | POA: Diagnosis not present

## 2020-09-01 DIAGNOSIS — G4733 Obstructive sleep apnea (adult) (pediatric): Secondary | ICD-10-CM | POA: Diagnosis not present

## 2020-09-01 DIAGNOSIS — I5033 Acute on chronic diastolic (congestive) heart failure: Secondary | ICD-10-CM | POA: Diagnosis not present

## 2020-09-01 DIAGNOSIS — E063 Autoimmune thyroiditis: Secondary | ICD-10-CM | POA: Diagnosis not present

## 2020-09-01 DIAGNOSIS — I110 Hypertensive heart disease with heart failure: Secondary | ICD-10-CM | POA: Diagnosis not present

## 2020-09-01 DIAGNOSIS — E119 Type 2 diabetes mellitus without complications: Secondary | ICD-10-CM | POA: Diagnosis not present

## 2020-09-01 DIAGNOSIS — I200 Unstable angina: Secondary | ICD-10-CM | POA: Diagnosis not present

## 2020-09-01 NOTE — Telephone Encounter (Signed)
Received a call from patient she stated she has gained 4 to 5 lbs within the past 4 days.No sob.Swelling in both lower legs about the same.B/P this morning 105/68.Advised to double Lasix dose for the next 3 days then return to normal dose.Appointment scheduled with Dr.Croitoru 8/18 at 11:00 am.

## 2020-09-01 NOTE — Telephone Encounter (Signed)
Pt c/o swelling: STAT is pt has developed SOB within 24 hours  If swelling, where is the swelling located? Legs and feet  How much weight have you gained and in what time span? 5.2 in four days  Have you gained 3 pounds in a day or 5 pounds in a week? yes  Do you have a log of your daily weights (if so, list)? Yes 232.6 yesterday this morning 231.0  Are you currently taking a fluid pill? Yes   Are you currently SOB? No   Have you traveled recently? No

## 2020-09-02 DIAGNOSIS — R5383 Other fatigue: Secondary | ICD-10-CM | POA: Diagnosis not present

## 2020-09-02 DIAGNOSIS — Z09 Encounter for follow-up examination after completed treatment for conditions other than malignant neoplasm: Secondary | ICD-10-CM | POA: Diagnosis not present

## 2020-09-02 DIAGNOSIS — R110 Nausea: Secondary | ICD-10-CM | POA: Diagnosis not present

## 2020-09-02 DIAGNOSIS — R0602 Shortness of breath: Secondary | ICD-10-CM | POA: Diagnosis not present

## 2020-09-03 DIAGNOSIS — E119 Type 2 diabetes mellitus without complications: Secondary | ICD-10-CM | POA: Diagnosis not present

## 2020-09-03 DIAGNOSIS — E785 Hyperlipidemia, unspecified: Secondary | ICD-10-CM | POA: Diagnosis not present

## 2020-09-03 DIAGNOSIS — G4733 Obstructive sleep apnea (adult) (pediatric): Secondary | ICD-10-CM | POA: Diagnosis not present

## 2020-09-03 DIAGNOSIS — I5033 Acute on chronic diastolic (congestive) heart failure: Secondary | ICD-10-CM | POA: Diagnosis not present

## 2020-09-03 DIAGNOSIS — I110 Hypertensive heart disease with heart failure: Secondary | ICD-10-CM | POA: Diagnosis not present

## 2020-09-03 DIAGNOSIS — E871 Hypo-osmolality and hyponatremia: Secondary | ICD-10-CM | POA: Diagnosis not present

## 2020-09-03 DIAGNOSIS — I200 Unstable angina: Secondary | ICD-10-CM | POA: Diagnosis not present

## 2020-09-03 DIAGNOSIS — E063 Autoimmune thyroiditis: Secondary | ICD-10-CM | POA: Diagnosis not present

## 2020-09-04 ENCOUNTER — Encounter: Payer: Self-pay | Admitting: Cardiovascular Disease

## 2020-09-04 ENCOUNTER — Other Ambulatory Visit: Payer: Self-pay

## 2020-09-04 ENCOUNTER — Ambulatory Visit (INDEPENDENT_AMBULATORY_CARE_PROVIDER_SITE_OTHER): Payer: Medicare PPO | Admitting: Cardiovascular Disease

## 2020-09-04 VITALS — BP 110/50 | HR 91 | Resp 20 | Ht 63.0 in | Wt 230.4 lb

## 2020-09-04 DIAGNOSIS — E782 Mixed hyperlipidemia: Secondary | ICD-10-CM

## 2020-09-04 DIAGNOSIS — R42 Dizziness and giddiness: Secondary | ICD-10-CM | POA: Diagnosis not present

## 2020-09-04 DIAGNOSIS — G4733 Obstructive sleep apnea (adult) (pediatric): Secondary | ICD-10-CM

## 2020-09-04 DIAGNOSIS — Z8679 Personal history of other diseases of the circulatory system: Secondary | ICD-10-CM | POA: Diagnosis not present

## 2020-09-04 DIAGNOSIS — E119 Type 2 diabetes mellitus without complications: Secondary | ICD-10-CM

## 2020-09-04 DIAGNOSIS — I10 Essential (primary) hypertension: Secondary | ICD-10-CM

## 2020-09-04 DIAGNOSIS — E861 Hypovolemia: Secondary | ICD-10-CM | POA: Diagnosis not present

## 2020-09-04 DIAGNOSIS — I5032 Chronic diastolic (congestive) heart failure: Secondary | ICD-10-CM | POA: Diagnosis not present

## 2020-09-04 DIAGNOSIS — R3589 Other polyuria: Secondary | ICD-10-CM

## 2020-09-04 DIAGNOSIS — Z794 Long term (current) use of insulin: Secondary | ICD-10-CM | POA: Diagnosis not present

## 2020-09-04 MED ORDER — METOLAZONE 2.5 MG PO TABS: ORAL_TABLET | ORAL | 0 refills | Status: DC

## 2020-09-04 NOTE — Progress Notes (Signed)
Cardiology Office Note    Date:  09/11/2020   ID:  [name], Mary 1941-01-06, MRN [id number]  PCP:  Merrilee Seashore, MD  Cardiologist:   Sanda Klein, MD   Chief Complaint  Patient presents with   Fatigue     History of Present Illness:  [name] is a 80 y.o. female with obesity, type 2 diabetes mellitus, previous history of TIA and possible ischemic colitis, hypertension. She has normal left ventricular systolic function and regional wall motion without valvular abnormalities.  She had a protracted course of acute pericarditis throughout the first half of 2019.  Cardiac MRI in April 2019 showed normal left ventricular systolic function with mild LVH and no evidence of late gadolinium enhancement.  Echo on August 10, 2017 showed complete resolution of the pericardial effusion.  The most recent echo in May 2022 showed a hyperdynamic LV with EF of 70-75%, moderate concentric LVH and grade 1 diastolic dysfunction without any pericardial effusion or valvular abnormalities.  She had normal coronary arteries by catheterization in 2016.  She was hospitalized from August 03 through August 10/07/2020 with pneumonia.  Her presentation was actually with abdominal pain and shortness of breath.  During her hospital stay she was treated aggressively with diuretics and reportedly diuresed 7 L.  Discharge weight was 227 pounds.  CT of the abdomen and pelvis showed fatty liver changes, constipation and diverticulosis, but was otherwise benign.  She has had problems this year with lower extremity edema which made it difficult to even put shoes on, despite wearing compression stockings and following a low-sodium diet.  She took a dose of metolazone yesterday for worsening edema and weight gain.  She had marked increase in her diuresis and today she feels extremely weak, dizzy and nauseous.  This is despite the fact that today she weighs 230 pounds which is more than the discharge weight  from her hospitalization on 08/27/2020 when she weighed 227 pounds.  She still has about 1-2+ lower extremity edema and is wearing compression stockings.  Her blood pressure is low for her at 110/50 and she is relatively tachycardic with a rate of 91 bpm.  Her blood pressure tends to be a little variable, with a range of 135-162/76-91 heart rate in the 70s and 80s, oxygen saturation consistently normal  Past Medical History:  Diagnosis Date   Bipolar disorder ([place])    Depression    Bipolar   GERD (gastroesophageal reflux disease)    History of hiatal hernia    History of kidney stones    History of stomach ulcers 1970s   "bleeding"   Hyperlipidemia    Hypertension    Hypothyroidism    Ischemic colitis, enteritis, or enterocolitis ([place])    OSA on CPAP    Pericarditis 05/06/2017   Pneumonia    "several times" (06/23/2017)   Thyroid disease    TIA (transient ischemic attack) 2011 X 2   Type II diabetes mellitus ([place])    Urinary bladder incontinence    Vertigo     Past Surgical History:  Procedure Laterality Date   APPENDECTOMY     BREAST CYST ASPIRATION Bilateral    CARDIAC CATHETERIZATION N/A 12/24/2014   Procedure: Right/Left Heart Cath and Coronary Angiography;  Surgeon: Sanda Klein, MD;  Location: [facility];  Service: Cardiovascular;  Laterality: N/A;   CATARACT EXTRACTION W/ INTRAOCULAR LENS  IMPLANT, BILATERAL Bilateral    EXCISIONAL HEMORRHOIDECTOMY     EYE SURGERY  FRACTURE SURGERY     IR THORACENTESIS ASP PLEURAL SPACE W/IMG GUIDE  05/13/2017   LAPAROSCOPIC CHOLECYSTECTOMY     LITHOTRIPSY  "several times"   RETINAL DETACHMENT SURGERY     "think it was on the left; not sure" (06/23/2017)   TONSILLECTOMY     VAGINAL HYSTERECTOMY     partial     Current Medications: Outpatient Medications Prior to Visit  Medication Sig Dispense Refill   Accu-Chek Softclix Lancets lancets Use 1-4 times daily as needed/instructed  DX E11.9 360 each 3   aspirin EC 81 MG  EC tablet Take 1 tablet (81 mg total) by mouth daily. 30 tablet 0   Blood Glucose Monitoring Suppl (ACCU-CHEK GUIDE) w/Device KIT Use 1-4 times daily as needed/directed  DX E11.9 1 kit 1   carbamazepine (CARBATROL) 200 MG 12 hr capsule Take 200 mg by mouth 2 (two) times daily.      Cholecalciferol (QC VITAMIN D3) 50 MCG (2000 UT) TABS Take 2,000 Units by mouth daily.     clobetasol (TEMOVATE) 0.05 % GEL Apply 1 application topically 2 (two) times daily as needed (rash).      dapagliflozin propanediol (FARXIGA) 10 MG TABS tablet Take 1 tablet (10 mg total) by mouth daily. Generic only 30 tablet 5   diltiazem (CARDIZEM CD) 240 MG 24 hr capsule Take 1 capsule (240 mg total) by mouth at bedtime. 30 capsule 6   donepezil (ARICEPT) 10 MG tablet Take 10 mg by mouth at bedtime.     esomeprazole (NEXIUM) 40 MG capsule Take 1 capsule (40 mg total) by mouth daily at 12 noon. 30 capsule 2   famotidine (PEPCID) 20 MG tablet Take 20 mg by mouth 2 (two) times daily.     folic acid (FOLVITE) 1 MG tablet Take 1 mg by mouth daily.     furosemide (LASIX) 40 MG tablet Take 1 tablet (40 mg total) by mouth 2 (two) times daily. 60 tablet 0   gabapentin (NEURONTIN) 300 MG capsule Take 300 mg by mouth at bedtime.     glucose blood (ACCU-CHEK GUIDE) test strip Use 1-4 times daily as needed/instructed DX E11.9 300 each 12   Insulin Aspart FlexPen 100 UNIT/ML SOPN INJECT 14 TO 16 UNITS BEFORE MEALS AS DIRECTED 15 mL 1   insulin degludec (TRESIBA FLEXTOUCH) 100 UNIT/ML FlexTouch Pen Inject 32 Units into the skin daily. Takes daily in the AM 15 mL 5   Insulin Disposable Pump (V-GO 40) KIT USE 1 KIT DAILY AS DIRECTED 1 kit 5   Insulin Pen Needle (BD PEN NEEDLE NANO U/F) 32G X 4 MM MISC USE 8 PEN NEEDLES PER DAY 200 each 0   Insulin Pen Needle (COMFORT EZ PEN NEEDLES) 33G X 5 MM MISC 1 each by Does not apply route daily. Use with Tresiba pen 100 each 0   Insulin Pen Needle 32G X 5 MM MISC 1 each by Does not apply route daily.  Use with Tresiba pen 100 each 0   isosorbide mononitrate (IMDUR) 30 MG 24 hr tablet Take 1 tablet (30 mg total) by mouth daily. 90 tablet 3   levothyroxine (SYNTHROID, LEVOTHROID) 75 MCG tablet Take 75 mcg by mouth daily before breakfast.     liraglutide (VICTOZA) 18 MG/3ML SOPN DIAL AND INJECT UNDER THE SKIN 1.8 MG DAILY 9 mL 3   methotrexate (RHEUMATREX) 2.5 MG tablet Take 10 mg by mouth every Monday. For rash     montelukast (SINGULAIR) 5 MG chewable tablet Chew 5  mg by mouth at bedtime.     OLANZapine (ZYPREXA) 20 MG tablet Take 20 mg by mouth at bedtime.     OLANZapine (ZYPREXA) 7.5 MG tablet Take 15 mg by mouth at bedtime.     olmesartan (BENICAR) 5 MG tablet Take 5 mg by mouth daily.     ondansetron (ZOFRAN) 4 MG tablet Take 4 mg by mouth every 8 (eight) hours as needed for nausea or vomiting.     ONETOUCH DELICA LANCETS 95M MISC USE LANCET TO TEST FIVE TIMES A DAY 200 each 0   potassium chloride (KLOR-CON) 10 MEQ tablet Take 1 tablet (10 mEq total) by mouth daily. 30 tablet 0   pravastatin (PRAVACHOL) 20 MG tablet Take 20 mg by mouth daily.     QUEtiapine (SEROQUEL) 25 MG tablet Take 100 mg by mouth at bedtime.     spironolactone (ALDACTONE) 25 MG tablet Take 1 tablet (25 mg total) by mouth daily. 30 tablet 0   metolazone (ZAROXOLYN) 2.5 MG tablet Take 1 pill once a week every Wednesday as needed if you have gained more than 2.5 pounds of weight in a week, take an extra pill of 20 mg of potassium that day. 10 tablet 0   No facility-administered medications prior to visit.     Allergies:   Flagyl [metronidazole hcl], Ciprofloxacin, Metformin and related, Milk-related compounds, and Other   Social History   Socioeconomic History   Marital status: Widowed    Spouse name: Not on file   Number of children: 2   Years of education: 29   Highest education level: Not on file  Occupational History   Occupation: Retired  Tobacco Use   Smoking status: Never   Smokeless tobacco: Never   Vaping Use   Vaping Use: Never used  Substance and Sexual Activity   Alcohol use: Never   Drug use: Never   Sexual activity: Not on file  Other Topics Concern   Not on file  Social History Narrative   Lives at home alone.   Right-handed.   No caffeine use.   Social Determinants of Health   Financial Resource Strain: Not on file  Food Insecurity: Not on file  Transportation Needs: Not on file  Physical Activity: Not on file  Stress: Not on file  Social Connections: Not on file     Family History:  The patient's family history includes Cancer in her brother and sister; Heart disease in her mother; Parkinson's disease in her father; Stroke in her father.   ROS:   Please see the history of present illness.   All other systems are reviewed and are negative.   PHYSICAL EXAM:   VS:  BP (!) 110/50   Pulse 91   Resp 20   Ht 5' 3"  (1.6 m)   Wt 230 lb 6.4 oz (104.5 kg)   SpO2 93%   Breastfeeding Unknown   BMI 40.81 kg/m      General: Alert, oriented x3, no distress, morbidly obese Head: no evidence of trauma, PERRL, EOMI, no exophtalmos or lid lag, no myxedema, no xanthelasma; normal ears, nose and oropharynx Neck: normal jugular venous pulsations and no hepatojugular reflux; brisk carotid pulses without delay and no carotid bruits Chest: clear to auscultation, no signs of consolidation by percussion or palpation, normal fremitus, symmetrical and full respiratory excursions Cardiovascular: normal position and quality of the apical impulse, regular rhythm, normal first and second heart sounds, no murmurs, rubs or gallops Abdomen: no tenderness or distention, no  masses by palpation, no abnormal pulsatility or arterial bruits, normal bowel sounds, no hepatosplenomegaly Extremities: no clubbing, cyanosis ; 1-2+ pretibial edema ; 2+ radial, ulnar and brachial pulses bilaterally; 2+ right femoral, posterior tibial and dorsalis pedis pulses; 2+ left femoral, posterior tibial and  dorsalis pedis pulses; no subclavian or femoral bruits Neurological: grossly nonfocal Psych: Normal mood and affect    Wt Readings from Last 3 Encounters:  09/04/20 230 lb 6.4 oz (104.5 kg)  08/21/20 232 lb (105.2 kg)  07/10/20 239 lb 6.4 oz (108.6 kg)      Studies/Labs Reviewed:   ECHO 05/20/2020:  1. Left ventricular ejection fraction, by estimation, is 70 to 75%. The  left ventricle has hyperdynamic function. The left ventricle has no  regional wall motion abnormalities. There is moderate concentric left  ventricular hypertrophy. Left ventricular  diastolic parameters are consistent with Grade I diastolic dysfunction  (impaired relaxation).   2. Right ventricular systolic function is normal. The right ventricular  size is normal. Tricuspid regurgitation signal is inadequate for assessing  PA pressure.   3. The mitral valve is degenerative. Trivial mitral valve regurgitation.  No evidence of mitral stenosis.   4. The aortic valve is tricuspid. There is mild calcification of the  aortic valve. Aortic valve regurgitation is not visualized. No aortic  stenosis is present.   Comparison(s): No significant change from prior study.   EKG:  EKG is not  ordered today.  The most recent tracing from 08/21/2020 shows normal sinus rhythm and left axis deviation at about -30 degrees and poor R wave progression in the anterior leads, isolated T wave inversion in leads I and aVL (in the past she has had full-blown left anterior fascicular block)  Recent Labs: 08/20/2020: TSH 1.526 08/23/2020: ALT 25 08/25/2020: Hemoglobin 14.0; Platelets 307 09/04/2020: BNP 17.9; BUN 23; Creatinine, Ser 1.19; Magnesium 2.2; Potassium 4.5; Sodium 129   Lipid Panel    Component Value Date/Time   CHOL 149 03/26/2017 1710   TRIG 130 03/26/2017 1710   HDL 56 12/24/2019 0000   CHOLHDL 3.0 03/26/2017 1710   VLDL 26 03/26/2017 1710   LDLCALC 82 12/24/2019 0000    05/14/2020 Cholesterol 179, HDL 53, LDL 92,  triglycerides 198 Hemoglobin A1c 7.5% Creatinine 1.04 Normal liver function tests 12/18/2019 TSH 2.010  ASSESSMENT:    1. Hypovolemia associated with diuresis   2. Chronic diastolic heart failure ([place])   3. History of pericarditis   4. Essential hypertension, benign   5. Mixed hyperlipidemia   6. Type 2 diabetes mellitus without complication, with long-term current use of insulin ([place])   7. OSA (obstructive sleep apnea)       PLAN:  In order of problems listed above:  Weakness/hypotension: She appears to be hypovolemic after the dose of metolazone that she took yesterday.  Liberal fluid and salt intake today.  We will try to avoid metolazone in the future - increase the weight threshold for metolazone administration to 235 pounds.  Checked labs today: She has hyponatremia consistent with abrupt hypovolemia, BUN/creatinine are not too bad, but well above her baseline, BNP is extremely low at 18, thankfully electrolytes are normal. CHF: Her BNP has never been elevated, but she has had shortness of breath and edema that has improved with diuretic therapy.  She will probably continue to have some degree of lower extremity edema even when hypovolemic due to longstanding OSA and obesity, peripheral venous insufficiency and treatment with diltiazem.  The edema needs to be treated  only to the degree that it causes discomfort or unhealthy skin changes. History of acute pericarditis: No evidence of effusion or constriction on her follow-up echo. HTN: Low today, usually well controlled.  Did not tolerate metoprolol well in the past.  In the past, she has had a lot of difficulty ever getting her blood pressure control except when using a calcium channel blocker. HLP: All lipid parameters appear to be in target range, except for very mild hypertriglyceridemia.  Olanzapine may be contributing to her dyslipidemia. DM: Good glycemic control.  Her medications include an SGLT2 inhibitor. OSA: Monitored in  the pulmonary clinic by Dr. Elsworth Soho.  She denies daytime hypersomnolence and reports compliance with CPAP.  Medication Adjustments/Labs and Tests Ordered: Current medicines are reviewed at length with the patient today.  Concerns regarding medicines are outlined above.  Medication changes, Labs and Tests ordered today are listed in the Patient Instructions below. Patient Instructions  Medication Instructions:  Metolazone- take one tablet once a week for a weight over 235 pounds. Do not take more than once weekly  Magnesium: take one 400 mg tablet today  *If you need a refill on your cardiac medications before your next appointment, please call your pharmacy*   Lab Work: Your provider would like for you to have the following labs today: BNP, BMET and Magnesium  If you have labs (blood work) drawn today and your tests are completely normal, you will receive your results only by: [place] (if you have MyChart) OR A paper copy in the mail If you have any lab test that is abnormal or we need to change your treatment, we will call you to review the results.   Testing/Procedures: None ordered   Follow-Up: At [facility], you and your health needs are our priority.  As part of our continuing mission to provide you with exceptional heart care, we have created designated Provider Care Teams.  These Care Teams include your primary Cardiologist (physician) and Advanced Practice Providers (APPs -  Physician Assistants and Nurse Practitioners) who all work together to provide you with the care you need, when you need it.  We recommend signing up for the patient portal called "MyChart".  Sign up information is provided on this After Visit Summary.  MyChart is used to connect with patients for Virtual Visits (Telemedicine).  Patients are able to view lab/test results, encounter notes, upcoming appointments, etc.  Non-urgent messages can be sent to your provider as well.   To learn more about  what you can do with MyChart, go to [email or web address].    Your next appointment:   Keep your follow up with Jory Sims, DNP on 09/12/20   Signed, Sanda Klein, MD  09/11/2020 7:17 PM    [facility] [address], [place], [place]  [address] Phone: [phone]; Fax: [phone]

## 2020-09-04 NOTE — Patient Instructions (Addendum)
Medication Instructions:  Metolazone- take one tablet once a week for a weight over 235 pounds. Do not take more than once weekly  Magnesium: take one 400 mg tablet today  *If you need a refill on your cardiac medications before your next appointment, please call your pharmacy*   Lab Work: Your provider would like for you to have the following labs today: BNP, BMET and Magnesium  If you have labs (blood work) drawn today and your tests are completely normal, you will receive your results only by: MyChart Message (if you have MyChart) OR A paper copy in the mail If you have any lab test that is abnormal or we need to change your treatment, we will call you to review the results.   Testing/Procedures: None ordered   Follow-Up: At [facility], you and your health needs are our priority.  As part of our continuing mission to provide you with exceptional heart care, we have created designated Provider Care Teams.  These Care Teams include your primary Cardiologist (physician) and Advanced Practice Providers (APPs -  Physician Assistants and Nurse Practitioners) who all work together to provide you with the care you need, when you need it.  We recommend signing up for the patient portal called "MyChart".  Sign up information is provided on this After Visit Summary.  MyChart is used to connect with patients for Virtual Visits (Telemedicine).  Patients are able to view lab/test results, encounter notes, upcoming appointments, etc.  Non-urgent messages can be sent to your provider as well.   To learn more about what you can do with MyChart, go to [email or web address].    Your next appointment:   Keep your follow up with Joni Reining, DNP on 09/12/20

## 2020-09-05 ENCOUNTER — Other Ambulatory Visit: Payer: Medicare PPO

## 2020-09-05 LAB — MAGNESIUM: Magnesium: 2.2 mg/dL (ref 1.6–2.3)

## 2020-09-05 LAB — BASIC METABOLIC PANEL
BUN/Creatinine Ratio: 19 (ref 12–28)
BUN: 23 mg/dL (ref 8–27)
CO2: 24 mmol/L (ref 20–29)
Calcium: 10 mg/dL (ref 8.7–10.3)
Chloride: 84 mmol/L — ABNORMAL LOW (ref 96–106)
Creatinine, Ser: 1.19 mg/dL — ABNORMAL HIGH (ref 0.57–1.00)
Glucose: 136 mg/dL — ABNORMAL HIGH (ref 65–99)
Potassium: 4.5 mmol/L (ref 3.5–5.2)
Sodium: 129 mmol/L — ABNORMAL LOW (ref 134–144)
eGFR: 47 mL/min/{1.73_m2} — ABNORMAL LOW (ref >59)

## 2020-09-05 LAB — BRAIN NATRIURETIC PEPTIDE: BNP: 17.9 pg/mL (ref 0.0–100.0)

## 2020-09-08 ENCOUNTER — Telehealth: Payer: Self-pay | Admitting: Nutrition

## 2020-09-08 DIAGNOSIS — E119 Type 2 diabetes mellitus without complications: Secondary | ICD-10-CM | POA: Diagnosis not present

## 2020-09-08 DIAGNOSIS — Z794 Long term (current) use of insulin: Secondary | ICD-10-CM

## 2020-09-08 DIAGNOSIS — G4733 Obstructive sleep apnea (adult) (pediatric): Secondary | ICD-10-CM | POA: Diagnosis not present

## 2020-09-08 DIAGNOSIS — E785 Hyperlipidemia, unspecified: Secondary | ICD-10-CM | POA: Diagnosis not present

## 2020-09-08 DIAGNOSIS — I200 Unstable angina: Secondary | ICD-10-CM | POA: Diagnosis not present

## 2020-09-08 DIAGNOSIS — I5033 Acute on chronic diastolic (congestive) heart failure: Secondary | ICD-10-CM | POA: Diagnosis not present

## 2020-09-08 DIAGNOSIS — E871 Hypo-osmolality and hyponatremia: Secondary | ICD-10-CM | POA: Diagnosis not present

## 2020-09-08 DIAGNOSIS — E063 Autoimmune thyroiditis: Secondary | ICD-10-CM | POA: Diagnosis not present

## 2020-09-08 DIAGNOSIS — I110 Hypertensive heart disease with heart failure: Secondary | ICD-10-CM | POA: Diagnosis not present

## 2020-09-08 NOTE — Telephone Encounter (Signed)
Patient is requesting a Libre 2.  She will need the reader as well. She was notified that [facility] will be contacting her once the script goes through.

## 2020-09-09 DIAGNOSIS — I200 Unstable angina: Secondary | ICD-10-CM | POA: Diagnosis not present

## 2020-09-09 DIAGNOSIS — E119 Type 2 diabetes mellitus without complications: Secondary | ICD-10-CM | POA: Diagnosis not present

## 2020-09-09 DIAGNOSIS — I5033 Acute on chronic diastolic (congestive) heart failure: Secondary | ICD-10-CM | POA: Diagnosis not present

## 2020-09-09 DIAGNOSIS — G4733 Obstructive sleep apnea (adult) (pediatric): Secondary | ICD-10-CM | POA: Diagnosis not present

## 2020-09-09 DIAGNOSIS — E063 Autoimmune thyroiditis: Secondary | ICD-10-CM | POA: Diagnosis not present

## 2020-09-09 DIAGNOSIS — E785 Hyperlipidemia, unspecified: Secondary | ICD-10-CM | POA: Diagnosis not present

## 2020-09-09 DIAGNOSIS — I110 Hypertensive heart disease with heart failure: Secondary | ICD-10-CM | POA: Diagnosis not present

## 2020-09-09 DIAGNOSIS — E871 Hypo-osmolality and hyponatremia: Secondary | ICD-10-CM | POA: Diagnosis not present

## 2020-09-10 DIAGNOSIS — I110 Hypertensive heart disease with heart failure: Secondary | ICD-10-CM | POA: Diagnosis not present

## 2020-09-10 DIAGNOSIS — E063 Autoimmune thyroiditis: Secondary | ICD-10-CM | POA: Diagnosis not present

## 2020-09-10 DIAGNOSIS — E871 Hypo-osmolality and hyponatremia: Secondary | ICD-10-CM | POA: Diagnosis not present

## 2020-09-10 DIAGNOSIS — I5033 Acute on chronic diastolic (congestive) heart failure: Secondary | ICD-10-CM | POA: Diagnosis not present

## 2020-09-10 DIAGNOSIS — E785 Hyperlipidemia, unspecified: Secondary | ICD-10-CM | POA: Diagnosis not present

## 2020-09-10 DIAGNOSIS — G4733 Obstructive sleep apnea (adult) (pediatric): Secondary | ICD-10-CM | POA: Diagnosis not present

## 2020-09-10 DIAGNOSIS — E119 Type 2 diabetes mellitus without complications: Secondary | ICD-10-CM | POA: Diagnosis not present

## 2020-09-10 DIAGNOSIS — I200 Unstable angina: Secondary | ICD-10-CM | POA: Diagnosis not present

## 2020-09-11 ENCOUNTER — Encounter: Payer: Self-pay | Admitting: Cardiovascular Disease

## 2020-09-11 NOTE — Progress Notes (Signed)
Cardiology Office Note   Date:  09/12/2020   ID:  [name], [name] December 03, 1940, MRN [id number]  PCP:  Mary Seashore, MD  Cardiologist:  Dr.Croitoru  CC: Hospital Follow Up   History of Present Illness: [name] is a 80 y.o. female who presents for ongoing assessment and management of hypertension, history of acute pericarditis in 2019, previous history of TIA and possible ischemic colitis, obesity, type 2 diabetes, OSA on CPAP.  She did have a cardiac MRI in April 2019 which revealed normal LV systolic function with mild LVH and no evidence of late gadolinium enhancement.  Follow-up echocardiogram in July 2019 revealed complete resolution of the pericardial effusion.  Cardiac catheterization in 2016 revealed normal coronary arteries.  The patient's main complaint is chronic lower extremity edema which is felt to be related to peripheral venous insufficiency and diltiazem.  Unfortunately taking her off of calcium channel blocker and changing to other agent did not adequately control her blood pressure and therefore this was continued.  She especially did not tolerate metoprolol.  She is being seen today posthospitalization follow-up after being admitted with generalized abdominal pain, orthopnea, shortness of breath, and volume overload, acute on chronic diastolic CHF.  She was given IV diuresis with Lasix and had 7 L removed, with resolution of symptoms.  She was also treated for community-acquired pneumonia and acute metabolic encephalopathy.  She comes today with her son and I have multiple questions concerning her status and reason for decompensation of heart failure.  She has limited her salt intake, changed her diet and is starting to become more active, walking to the mailbox.  She denies any chest pain dyspnea on exertion PND or orthopnea.  She is now wearing compression hose.  She does not have any coughing or wheezing.  She is weighing herself daily as directed.  Past  Medical History:  Diagnosis Date   Bipolar disorder ([place])    Depression    Bipolar   GERD (gastroesophageal reflux disease)    History of hiatal hernia    History of kidney stones    History of stomach ulcers 1970s   "bleeding"   Hyperlipidemia    Hypertension    Hypothyroidism    Ischemic colitis, enteritis, or enterocolitis ([place])    OSA on CPAP    Pericarditis 05/06/2017   Pneumonia    "several times" (06/23/2017)   Thyroid disease    TIA (transient ischemic attack) 2011 X 2   Type II diabetes mellitus ([place])    Urinary bladder incontinence    Vertigo     Past Surgical History:  Procedure Laterality Date   APPENDECTOMY     BREAST CYST ASPIRATION Bilateral    CARDIAC CATHETERIZATION N/A 12/24/2014   Procedure: Right/Left Heart Cath and Coronary Angiography;  Surgeon: Mary Klein, MD;  Location: [facility];  Service: Cardiovascular;  Laterality: N/A;   CATARACT EXTRACTION W/ INTRAOCULAR LENS  IMPLANT, BILATERAL Bilateral    EXCISIONAL HEMORRHOIDECTOMY     EYE SURGERY     FRACTURE SURGERY     IR THORACENTESIS ASP PLEURAL SPACE W/IMG GUIDE  05/13/2017   LAPAROSCOPIC CHOLECYSTECTOMY     LITHOTRIPSY  "several times"   RETINAL DETACHMENT SURGERY     "think it was on the left; not sure" (06/23/2017)   TONSILLECTOMY     VAGINAL HYSTERECTOMY     partial      Current Outpatient Medications  Medication Sig Dispense Refill   Accu-Chek Softclix Lancets lancets Use 1-4  times daily as needed/instructed  DX E11.9 360 each 3   aspirin EC 81 MG EC tablet Take 1 tablet (81 mg total) by mouth daily. 30 tablet 0   Blood Glucose Monitoring Suppl (ACCU-CHEK GUIDE) w/Device KIT Use 1-4 times daily as needed/directed  DX E11.9 1 kit 1   carbamazepine (CARBATROL) 200 MG 12 hr capsule Take 200 mg by mouth 2 (two) times daily.      Cholecalciferol (QC VITAMIN D3) 50 MCG (2000 UT) TABS Take 2,000 Units by mouth daily.     clobetasol (TEMOVATE) 0.05 % GEL Apply 1 application topically 2  (two) times daily as needed (rash).      dapagliflozin propanediol (FARXIGA) 10 MG TABS tablet Take 1 tablet (10 mg total) by mouth daily. Generic only 30 tablet 5   diltiazem (CARDIZEM CD) 240 MG 24 hr capsule Take 1 capsule (240 mg total) by mouth at bedtime. 30 capsule 6   donepezil (ARICEPT) 10 MG tablet Take 10 mg by mouth at bedtime.     esomeprazole (NEXIUM) 40 MG capsule Take 1 capsule (40 mg total) by mouth daily at 12 noon. 30 capsule 2   famotidine (PEPCID) 20 MG tablet Take 20 mg by mouth 2 (two) times daily.     folic acid (FOLVITE) 1 MG tablet Take 1 mg by mouth daily.     furosemide (LASIX) 40 MG tablet Take 1 tablet (40 mg total) by mouth 2 (two) times daily. 60 tablet 0   gabapentin (NEURONTIN) 300 MG capsule Take 300 mg by mouth at bedtime.     glucose blood (ACCU-CHEK GUIDE) test strip Use 1-4 times daily as needed/instructed DX E11.9 300 each 12   Insulin Aspart FlexPen 100 UNIT/ML SOPN INJECT 14 TO 16 UNITS BEFORE MEALS AS DIRECTED 15 mL 1   insulin degludec (TRESIBA FLEXTOUCH) 100 UNIT/ML FlexTouch Pen Inject 32 Units into the skin daily. Takes daily in the AM 15 mL 5   Insulin Disposable Pump (V-GO 40) KIT USE 1 KIT DAILY AS DIRECTED 1 kit 5   Insulin Pen Needle (BD PEN NEEDLE NANO U/F) 32G X 4 MM MISC USE 8 PEN NEEDLES PER DAY 200 each 0   Insulin Pen Needle (COMFORT EZ PEN NEEDLES) 33G X 5 MM MISC 1 each by Does not apply route daily. Use with Tresiba pen 100 each 0   Insulin Pen Needle 32G X 5 MM MISC 1 each by Does not apply route daily. Use with Tresiba pen 100 each 0   isosorbide mononitrate (IMDUR) 30 MG 24 hr tablet Take 1 tablet (30 mg total) by mouth daily. 90 tablet 3   levothyroxine (SYNTHROID, LEVOTHROID) 75 MCG tablet Take 75 mcg by mouth daily before breakfast.     liraglutide (VICTOZA) 18 MG/3ML SOPN DIAL AND INJECT UNDER THE SKIN 1.8 MG DAILY 9 mL 3   methotrexate (RHEUMATREX) 2.5 MG tablet Take 10 mg by mouth every Monday. For rash     metolazone  (ZAROXOLYN) 2.5 MG tablet Take one tablet once a week for a weight over 235 pounds. Do not take more than once a week without provider approval. 10 tablet 0   montelukast (SINGULAIR) 5 MG chewable tablet Chew 5 mg by mouth at bedtime.     OLANZapine (ZYPREXA) 20 MG tablet Take 20 mg by mouth at bedtime.     OLANZapine (ZYPREXA) 7.5 MG tablet Take 15 mg by mouth at bedtime.     olmesartan (BENICAR) 5 MG tablet Take 5 mg by  mouth daily.     ondansetron (ZOFRAN) 4 MG tablet Take 4 mg by mouth every 8 (eight) hours as needed for nausea or vomiting.     ONETOUCH DELICA LANCETS 16X MISC USE LANCET TO TEST FIVE TIMES A DAY 200 each 0   potassium chloride (KLOR-CON) 10 MEQ tablet Take 1 tablet (10 mEq total) by mouth daily. 30 tablet 0   pravastatin (PRAVACHOL) 20 MG tablet Take 20 mg by mouth daily.     QUEtiapine (SEROQUEL) 25 MG tablet Take 100 mg by mouth at bedtime.     spironolactone (ALDACTONE) 25 MG tablet Take 1 tablet (25 mg total) by mouth daily. 30 tablet 0   No current facility-administered medications for this visit.    Allergies:   Flagyl [metronidazole hcl], Ciprofloxacin, Metformin and related, Milk-related compounds, and Other    Social History:  The patient  reports that she has never smoked. She has never used smokeless tobacco. She reports that she does not drink alcohol and does not use drugs.   Family History:  The patient's family history includes Cancer in her brother and sister; Heart disease in her mother; Parkinson's disease in her father; Stroke in her father.    ROS: All other systems are reviewed and negative. Unless otherwise mentioned in H&P    PHYSICAL EXAM: VS:  BP 130/68   Pulse 61   Ht 5' 3"  (1.6 m)   Wt 233 lb 9.6 oz (106 kg)   SpO2 95%   BMI 41.38 kg/m  , BMI Body mass index is 41.38 kg/m. GEN: Well nourished, well developed, in no acute distress HEENT: normal Neck: no JVD, carotid bruits, or masses Cardiac: RRR; no murmurs, rubs, or gallops,no  edema  Respiratory:  Clear to auscultation bilaterally, normal work of breathing GI: soft, nontender, nondistended, + BS MS: no deformity or atrophy, wearing support hose. Skin: warm and dry, no rash Neuro:  Strength and sensation are intact Psych: euthymic mood, full affect   EKG:  EKG is not ordered today.    Recent Labs: 08/20/2020: TSH 1.526 08/23/2020: ALT 25 08/25/2020: Hemoglobin 14.0; Platelets 307 09/04/2020: BNP 17.9; BUN 23; Creatinine, Ser 1.19; Magnesium 2.2; Potassium 4.5; Sodium 129    Lipid Panel    Component Value Date/Time   CHOL 149 03/26/2017 1710   TRIG 130 03/26/2017 1710   HDL 56 12/24/2019 0000   CHOLHDL 3.0 03/26/2017 1710   VLDL 26 03/26/2017 1710   LDLCALC 82 12/24/2019 0000      Wt Readings from Last 3 Encounters:  09/12/20 233 lb 9.6 oz (106 kg)  09/04/20 230 lb 6.4 oz (104.5 kg)  08/21/20 232 lb (105.2 kg)      Other studies Reviewed: Echocardiogram 2020/05/23 1. Left ventricular ejection fraction, by estimation, is 70 to 75%. The  left ventricle has hyperdynamic function. The left ventricle has no  regional wall motion abnormalities. There is moderate concentric left  ventricular hypertrophy. Left ventricular  diastolic parameters are consistent with Grade I diastolic dysfunction  (impaired relaxation).   2. Right ventricular systolic function is normal. The right ventricular  size is normal. Tricuspid regurgitation signal is inadequate for assessing  PA pressure.   3. The mitral valve is degenerative. Trivial mitral valve regurgitation.  No evidence of mitral stenosis.   4. The aortic valve is tricuspid. There is mild calcification of the  aortic valve. Aortic valve regurgitation is not visualized. No aortic  stenosis is present.   ASSESSMENT AND PLAN:  1.  Chronic diastolic heart failure: Recent hospitalization for decompensation with 7 L diuresis with IV diuretics.  I have reviewed her most recent echocardiogram which revealed normal  LV function in May 4373 with diastolic dysfunction.  She is severely limiting her salt intake and trying to eliminate it altogether.  She is weighing daily and taking her Lasix as directed.  Her son is very involved in her care and is supportive of her health maintenance.  I have answered questions and given explanations concerning diastolic heart failure.  2.  Hypertension: Continue diltiazem as directed along with Lasix for volume management.  As above she does not tolerate other antihypertensives.    3.  Type 2 diabetes: Blood sugar has been lower in the evening is requiring her to have to eat extra foods to bring her blood sugar up.  I advised her to let her primary care physician know so an insulin adjustment can be made especially if she is changing her eating habits and will be losing weight.    Current medicines are reviewed at length with the patient today.  I have spent 40 min's  dedicated to the care of this patient on the date of this encounter to include pre-visit review of records, assessment, management and diagnostic testing,with shared decision making.  Labs/ tests ordered today include:None   Phill Myron. West Pugh, ANP, AACC   09/12/2020 12:20 PM    [facility] [place] [address] Office [phone] Fax [phone]  Notice: This dictation was prepared with Dragon dictation along with smaller phrase technology. Any transcriptional errors that result from this process are unintentional and may not be corrected upon review.

## 2020-09-12 ENCOUNTER — Ambulatory Visit: Payer: Medicare PPO | Admitting: Endocrinology

## 2020-09-12 ENCOUNTER — Other Ambulatory Visit: Payer: Self-pay

## 2020-09-12 ENCOUNTER — Encounter: Payer: Self-pay | Admitting: Adult Health

## 2020-09-12 ENCOUNTER — Ambulatory Visit (INDEPENDENT_AMBULATORY_CARE_PROVIDER_SITE_OTHER): Payer: Medicare PPO | Admitting: Adult Health

## 2020-09-12 VITALS — BP 130/68 | HR 61 | Ht 63.0 in | Wt 233.6 lb

## 2020-09-12 DIAGNOSIS — I5032 Chronic diastolic (congestive) heart failure: Secondary | ICD-10-CM

## 2020-09-12 DIAGNOSIS — I5033 Acute on chronic diastolic (congestive) heart failure: Secondary | ICD-10-CM | POA: Diagnosis not present

## 2020-09-12 DIAGNOSIS — Z794 Long term (current) use of insulin: Secondary | ICD-10-CM | POA: Diagnosis not present

## 2020-09-12 DIAGNOSIS — I110 Hypertensive heart disease with heart failure: Secondary | ICD-10-CM | POA: Diagnosis not present

## 2020-09-12 DIAGNOSIS — I10 Essential (primary) hypertension: Secondary | ICD-10-CM | POA: Diagnosis not present

## 2020-09-12 DIAGNOSIS — E119 Type 2 diabetes mellitus without complications: Secondary | ICD-10-CM | POA: Diagnosis not present

## 2020-09-12 DIAGNOSIS — E063 Autoimmune thyroiditis: Secondary | ICD-10-CM | POA: Diagnosis not present

## 2020-09-12 NOTE — Patient Instructions (Signed)
Medication Instructions:  Continue current medications  *If you need a refill on your cardiac medications before your next appointment, please call your pharmacy*   Lab Work: None Ordered   Testing/Procedures: None Ordered   Follow-Up: At [facility], you and your health needs are our priority.  As part of our continuing mission to provide you with exceptional heart care, we have created designated Provider Care Teams.  These Care Teams include your primary Cardiologist (physician) and Advanced Practice Providers (APPs -  Physician Assistants and Nurse Practitioners) who all work together to provide you with the care you need, when you need it.  We recommend signing up for the patient portal called "MyChart".  Sign up information is provided on this After Visit Summary.  MyChart is used to connect with patients for Virtual Visits (Telemedicine).  Patients are able to view lab/test results, encounter notes, upcoming appointments, etc.  Non-urgent messages can be sent to your provider as well.   To learn more about what you can do with MyChart, go to [email or web address].    Your next appointment:   Keep appointment with Dr Royann Shivers on December 9th @ 3:30 pm   Heart Failure, Diagnosis  Heart failure means that your heart is not able to pump blood in the right way. This makes it hard for your body to work well. Heart failure is usually a long-term (chronic) condition. You must take good care of yourself and follow your treatment planfrom your doctor. What are the causes? High blood pressure. Buildup of cholesterol and fat in the arteries. Heart attack. This injures the heart muscle. Heart valves that do not open and close properly. Damage of the heart muscle. This is also called cardiomyopathy. Infection of the heart muscle. This is also called myocarditis. Lung disease. What increases the risk? Getting older. The risk of heart failure goes up as a person ages. Being  overweight. Being female. Use tobacco or nicotine products. Abusing alcohol or drugs. Having taken medicines that can damage the heart. Having any of these conditions: Diabetes. Abnormal heart rhythms. Thyroid problems. Low blood counts (anemia). Having a family history of heart failure. What are the signs or symptoms? Shortness of breath. Coughing. Swelling of the feet, ankles, legs, or belly. Losing or gaining weight for no reason. Trouble breathing. Waking from sleep because of the need to sit up and get more air. Fast heartbeat. Being very tired. Feeling dizzy, or feeling like you may pass out (faint). Having no desire to eat. Feeling like you may vomit (nauseous). Peeing (urinating) more at night. Feeling confused. How is this treated? This condition may be treated with: Medicines. These can be given to treat blood pressure and to make the heart muscles stronger. Changes in your daily life. These may include: Eating a healthy diet. Staying at a healthy body weight. Quitting tobacco, alcohol, and drug use. Doing exercises. Participating in a cardiac rehabilitation program. This program helps you improve your health through exercise, education, and counseling. Surgery. Surgery can be done to open blocked valves, or to put devices in the heart, such as pacemakers. A donor heart (heart transplant). You will receive a healthy heart from a donor. Follow these instructions at home: Treat other conditions as told by your doctor. These may include high blood pressure, diabetes, thyroid disease, or abnormal heart rhythms. Learn as much as you can about heart failure. Get support as you need it. Keep all follow-up visits. Summary Heart failure means that your heart is not able to  pump blood in the right way. This condition is often caused by high blood pressure, heart attack, or damage of the heart muscle. Symptoms of this condition include shortness of breath and swelling of the  feet, ankles, legs, or belly. You may also feel very tired or feel like you may vomit. You may be treated with medicines, surgery, or changes in your daily life. Treat other health conditions as told by your doctor. This information is not intended to replace advice given to you by your health care provider. Make sure you discuss any questions you have with your healthcare provider. Document Revised: 07/28/2019 Document Reviewed: 07/28/2019 Elsevier Patient Education  2022 [employer].

## 2020-09-15 DIAGNOSIS — I110 Hypertensive heart disease with heart failure: Secondary | ICD-10-CM | POA: Diagnosis not present

## 2020-09-15 DIAGNOSIS — E063 Autoimmune thyroiditis: Secondary | ICD-10-CM | POA: Diagnosis not present

## 2020-09-15 DIAGNOSIS — G4733 Obstructive sleep apnea (adult) (pediatric): Secondary | ICD-10-CM | POA: Diagnosis not present

## 2020-09-15 DIAGNOSIS — E785 Hyperlipidemia, unspecified: Secondary | ICD-10-CM | POA: Diagnosis not present

## 2020-09-15 DIAGNOSIS — E871 Hypo-osmolality and hyponatremia: Secondary | ICD-10-CM | POA: Diagnosis not present

## 2020-09-15 DIAGNOSIS — I200 Unstable angina: Secondary | ICD-10-CM | POA: Diagnosis not present

## 2020-09-15 DIAGNOSIS — E119 Type 2 diabetes mellitus without complications: Secondary | ICD-10-CM | POA: Diagnosis not present

## 2020-09-15 DIAGNOSIS — I5033 Acute on chronic diastolic (congestive) heart failure: Secondary | ICD-10-CM | POA: Diagnosis not present

## 2020-09-16 ENCOUNTER — Other Ambulatory Visit: Payer: Self-pay

## 2020-09-16 ENCOUNTER — Other Ambulatory Visit (INDEPENDENT_AMBULATORY_CARE_PROVIDER_SITE_OTHER): Payer: Medicare PPO

## 2020-09-16 DIAGNOSIS — E1165 Type 2 diabetes mellitus with hyperglycemia: Secondary | ICD-10-CM

## 2020-09-16 DIAGNOSIS — Z794 Long term (current) use of insulin: Secondary | ICD-10-CM | POA: Diagnosis not present

## 2020-09-16 LAB — BASIC METABOLIC PANEL
BUN: 16 mg/dL (ref 6–23)
CO2: 31 mEq/L (ref 19–32)
Calcium: 9.2 mg/dL (ref 8.4–10.5)
Chloride: 93 mEq/L — ABNORMAL LOW (ref 96–112)
Creatinine, Ser: 0.9 mg/dL (ref 0.40–1.20)
GFR: 60.86 mL/min (ref >60.00)
Glucose, Bld: 83 mg/dL (ref 70–99)
Potassium: 4.6 mEq/L (ref 3.5–5.1)
Sodium: 129 mEq/L — ABNORMAL LOW (ref 135–145)

## 2020-09-17 ENCOUNTER — Telehealth: Payer: Self-pay | Admitting: *Deleted

## 2020-09-17 DIAGNOSIS — E871 Hypo-osmolality and hyponatremia: Secondary | ICD-10-CM | POA: Diagnosis not present

## 2020-09-17 DIAGNOSIS — I5033 Acute on chronic diastolic (congestive) heart failure: Secondary | ICD-10-CM | POA: Diagnosis not present

## 2020-09-17 DIAGNOSIS — E119 Type 2 diabetes mellitus without complications: Secondary | ICD-10-CM | POA: Diagnosis not present

## 2020-09-17 DIAGNOSIS — I110 Hypertensive heart disease with heart failure: Secondary | ICD-10-CM | POA: Diagnosis not present

## 2020-09-17 DIAGNOSIS — E063 Autoimmune thyroiditis: Secondary | ICD-10-CM | POA: Diagnosis not present

## 2020-09-17 DIAGNOSIS — I200 Unstable angina: Secondary | ICD-10-CM | POA: Diagnosis not present

## 2020-09-17 DIAGNOSIS — E785 Hyperlipidemia, unspecified: Secondary | ICD-10-CM | POA: Diagnosis not present

## 2020-09-17 DIAGNOSIS — G4733 Obstructive sleep apnea (adult) (pediatric): Secondary | ICD-10-CM | POA: Diagnosis not present

## 2020-09-17 LAB — FRUCTOSAMINE: Fructosamine: 223 umol/L (ref 0–285)

## 2020-09-17 MED ORDER — FREESTYLE LIBRE 2 SENSOR MISC: 3 refills | Status: DC

## 2020-09-17 MED ORDER — FREESTYLE LIBRE 2 READER DEVI: 0 refills | Status: DC

## 2020-09-17 NOTE — Telephone Encounter (Signed)
Rx sent to [facility]

## 2020-09-17 NOTE — Telephone Encounter (Signed)
Called the patient concerning her MyChart message. She stated that her weight was 235 two days ago and then 236 today so she took the metolazone. She stated that after she took the Metolazone she felt tired and weak and was unable to stand. PT came to the house but she was unable to do it today. She was feeling somewhat better by the time of the phone call. The son stated that she had no visible edema.  Her son who was also on the phone stated that he does not feel like her scale is reliable and does not think any medical changes should be made according to her scale readings. He will go buy her a new one. They have both been advised to have the patient weigh daily and to keep a record of these weights. They feel like the 235 weight cut off may need to be changed.  The patient stated that before taking the metolazone this morning she had been starting to feel better since her las visit.   Blood pressure this morning was 171/93 and then this afternoon was 140/80.  They will continue to keep a record of her blood pressures and weights. She will not take the metolazone again.  She currently takes Furosemide 40 mg bid and Spironolactone 25 mg once daily.   My weight increased to 236, so I took the metolazone and potassium as prescribed. I am feeling VERY weak and light headed and unable to stand or walk. This is how I felt last time I took this medication. Is there another medication I can take to help with fluid weight gain

## 2020-09-19 ENCOUNTER — Encounter: Payer: Self-pay | Admitting: Endocrinology

## 2020-09-19 ENCOUNTER — Other Ambulatory Visit: Payer: Self-pay

## 2020-09-19 ENCOUNTER — Ambulatory Visit (INDEPENDENT_AMBULATORY_CARE_PROVIDER_SITE_OTHER): Payer: Medicare PPO | Admitting: Endocrinology

## 2020-09-19 VITALS — BP 118/64 | HR 94 | Ht 63.0 in | Wt 230.6 lb

## 2020-09-19 DIAGNOSIS — Z794 Long term (current) use of insulin: Secondary | ICD-10-CM | POA: Diagnosis not present

## 2020-09-19 DIAGNOSIS — E063 Autoimmune thyroiditis: Secondary | ICD-10-CM

## 2020-09-19 DIAGNOSIS — E1165 Type 2 diabetes mellitus with hyperglycemia: Secondary | ICD-10-CM

## 2020-09-19 LAB — MICROALBUMIN / CREATININE URINE RATIO
Creatinine,U: 21.2 mg/dL
Microalb Creat Ratio: 3.3 mg/g (ref 0.0–30.0)
Microalb, Ur: <0.7 mg/dL (ref 0.0–1.9)

## 2020-09-19 NOTE — Patient Instructions (Addendum)
[place] [id number]  Check sugar when shaky

## 2020-09-19 NOTE — Progress Notes (Signed)
Patient ID: [name], female   DOB: 05-Apr-1940, 80 y.o.   MRN: [id number]    Reason for Appointment: Followup for Type 2 Diabetes    History of Present Illness:          Diagnosis: Type 2 diabetes mellitus, date of diagnosis:2011       Past history: Since she was apparently intolerant to metformin she was treated with Onglyza until about 2014  At that time because of increasing A1c of about 8% she was started on Levemir 15 units and this was aggressively increased Onglyza was continued She has not tried any other treatments for diabetes Her A1c has been 10-10.5 in 2015 In 3/15 she was told to start small doses of NovoLog at lunch and supper and add 15 units of Levemir at night also To control  hyperglycemia with her basal bolus insulin regimen she was given Victoza in addition on her initial consultation in 5/15 She has been on Victoza since 8/15  She has been on the V-go pump since 09/2014  Recent history:   INSULIN regimen :  V-go pump 40 unit basal, boluses at meal times: 10-28-10/14 units TRESIBA 32 units daily   Noninsulin hypoglycemic drugs the patient is taking are: Farxiga 10 mg, daily VICTOZA 1.8 mg  Her A1c is 7.8 in June Now fructosamine is 223  Current management, blood sugar patterns and problems: She was recommended the freestyle libre but still has not been able to be contacted by the DME supplier  The professional CGM that was done however appeared to show relatively low readings compared to her usual fingerstick readings  Without any change in insulin her home readings are now averaging 170 compared to 196 at previous visit She again complains of symptoms of shakiness around 9:00 at night but no blood sugar testing at that time with only 1 reading of 159 after supper She thinks she gets better and 20 minutes after eating some carbohydrate and protein at that time Also her CGM previously her lowest readings are after supper No overnight  hypoglycemia FASTING readings are quite variable but overall still high; however lab glucose fasting was only 83 POSTPRANDIAL readings are checked mostly in the afternoon and these are again variable She is usually trying to adjust her mealtime boluses based on what she is eating Taking her [place] and Victoza regularly She changes her pump at the same time daily at 2-3 pm She is not able to do much walking because of shortness of breath    Meals: 3 meals per day.  Breakfast: Oatmeal or egg or sausage at 8-9 am sometimes with sausage for protein, dinner 5 pm.;   Night snacks will be fruit or peanut butter crackers    Side effects from medications have been: Metformin: rash  Glucose monitoring:  done 1 time a day at least, mostly in the morning       Glucometer: Accu-Chek    Blood Glucose readings from review of meter:   PRE-MEAL Fasting Lunch Dinner Bedtime Overall  Glucose range: 140-212  111  110-267  Mean/median: 171    170   POST-MEAL PC Breakfast PC Lunch PC Dinner  Glucose range:     Mean/median:  181    Prior  PRE-MEAL Mornings Lunch Dinner Bedtime Overall  Glucose range: 159-439  142 140 122-439  Mean/median: 220 200   196   POST-MEAL PC Breakfast PC Lunch PC Dinner  Glucose range:   189  Mean/median:  Glycemic control:    Lab Results  Component Value Date   HGBA1C 7.8 (H) 07/04/2020   HGBA1C 7.5 (H) 03/28/2020   HGBA1C 6.8 12/24/2019   Lab Results  Component Value Date   MICROALBUR <0.7 10/02/2019   LDLCALC 82 12/24/2019   CREATININE 0.90 09/16/2020   Lab Results  Component Value Date   FRUCTOSAMINE 223 09/16/2020   FRUCTOSAMINE 259 10/17/2017   FRUCTOSAMINE 252 08/17/2016     Self-care: The diet that the patient has been following is: tries to limit fats            Dietician visit: Most recent: 2/19   CDE visit: 9/16    Weight history:  Wt Readings from Last 3 Encounters:  09/19/20 230 lb 9.6 oz (104.6 kg)  09/12/20 233 lb 9.6 oz  (106 kg)  09/04/20 230 lb 6.4 oz (104.5 kg)    Other active problems: See review of systems    Allergies as of 09/19/2020       Reactions   Flagyl [metronidazole Hcl] Itching, Rash   Ciprofloxacin Itching, Rash   Metformin And Related Rash   Milk-related Compounds Other (See Comments)   Stomach pains   Other Other (See Comments)   [place] nuts cause severe facial redness        Medication List        Accurate as of September 19, 2020  9:41 AM. If you have any questions, ask your nurse or doctor.          Accu-Chek Guide test strip Generic drug: glucose blood Use 1-4 times daily as needed/instructed DX E11.9   Accu-Chek Guide w/Device Kit Use 1-4 times daily as needed/directed  DX E11.9   aspirin 81 MG EC tablet Take 1 tablet (81 mg total) by mouth daily.   carbamazepine 200 MG 12 hr capsule Commonly known as: CARBATROL Take 200 mg by mouth 2 (two) times daily.   clobetasol 0.05 % Gel Commonly known as: TEMOVATE Apply 1 application topically 2 (two) times daily as needed (rash).   dapagliflozin propanediol 10 MG Tabs tablet Commonly known as: FARXIGA Take 1 tablet (10 mg total) by mouth daily. Generic only   diltiazem 240 MG 24 hr capsule Commonly known as: CARDIZEM CD Take 1 capsule (240 mg total) by mouth at bedtime.   donepezil 10 MG tablet Commonly known as: ARICEPT Take 10 mg by mouth at bedtime.   esomeprazole 40 MG capsule Commonly known as: NexIUM Take 1 capsule (40 mg total) by mouth daily at 12 noon.   famotidine 20 MG tablet Commonly known as: PEPCID Take 20 mg by mouth 2 (two) times daily.   folic acid 1 MG tablet Commonly known as: FOLVITE Take 1 mg by mouth daily.   FreeStyle Libre 2 Reader Kerrin Mo Use to check blood sugar daily   FreeStyle Libre 2 Sensor Misc Use to check blood sugar. Change every 14 days   furosemide 40 MG tablet Commonly known as: LASIX Take 1 tablet (40 mg total) by mouth 2 (two) times daily.   gabapentin  300 MG capsule Commonly known as: NEURONTIN Take 300 mg by mouth at bedtime.   Insulin Aspart FlexPen 100 UNIT/ML Commonly known as: NOVOLOG INJECT 14 TO 16 UNITS BEFORE MEALS AS DIRECTED   Insulin Pen Needle 33G X 5 MM Misc Commonly known as: Comfort EZ Pen Needles 1 each by Does not apply route daily. Use with Tresiba pen   Insulin Pen Needle 32G X 5 MM Misc 1  each by Does not apply route daily. Use with Tyler Aas pen   BD Pen Needle Nano U/F 32G X 4 MM Misc Generic drug: Insulin Pen Needle USE 8 PEN NEEDLES PER DAY   isosorbide mononitrate 30 MG 24 hr tablet Commonly known as: IMDUR Take 1 tablet (30 mg total) by mouth daily.   levothyroxine 75 MCG tablet Commonly known as: SYNTHROID Take 75 mcg by mouth daily before breakfast.   methotrexate 2.5 MG tablet Commonly known as: RHEUMATREX Take 10 mg by mouth every Monday. For rash   metolazone 2.5 MG tablet Commonly known as: ZAROXOLYN Take one tablet once a week for a weight over 235 pounds. Do not take more than once a week without provider approval.   montelukast 5 MG chewable tablet Commonly known as: SINGULAIR Chew 5 mg by mouth at bedtime.   OLANZapine 7.5 MG tablet Commonly known as: ZYPREXA Take 15 mg by mouth at bedtime.   OLANZapine 20 MG tablet Commonly known as: ZYPREXA Take 20 mg by mouth at bedtime.   olmesartan 5 MG tablet Commonly known as: BENICAR Take 5 mg by mouth daily.   ondansetron 4 MG tablet Commonly known as: ZOFRAN Take 4 mg by mouth every 8 (eight) hours as needed for nausea or vomiting.   OneTouch Delica Lancets 35H Misc USE LANCET TO TEST FIVE TIMES A DAY   Accu-Chek Softclix Lancets lancets Use 1-4 times daily as needed/instructed  DX E11.9   potassium chloride 10 MEQ tablet Commonly known as: KLOR-CON Take 1 tablet (10 mEq total) by mouth daily.   pravastatin 20 MG tablet Commonly known as: PRAVACHOL Take 20 mg by mouth daily.   QC Vitamin D3 50 MCG (2000 UT)  Tabs Generic drug: Cholecalciferol Take 2,000 Units by mouth daily.   QUEtiapine 25 MG tablet Commonly known as: SEROQUEL Take 100 mg by mouth at bedtime.   spironolactone 25 MG tablet Commonly known as: Aldactone Take 1 tablet (25 mg total) by mouth daily.   Tyler Aas FlexTouch 100 UNIT/ML FlexTouch Pen Generic drug: insulin degludec Inject 32 Units into the skin daily. Takes daily in the AM   V-Go 40 Kit USE 1 KIT DAILY AS DIRECTED   Victoza 18 MG/3ML Sopn Generic drug: liraglutide DIAL AND INJECT UNDER THE SKIN 1.8 MG DAILY        Allergies:  Allergies  Allergen Reactions   Flagyl [Metronidazole Hcl] Itching and Rash   Ciprofloxacin Itching and Rash   Metformin And Related Rash   Milk-Related Compounds Other (See Comments)    Stomach pains   Other Other (See Comments)    [place] nuts cause severe facial redness    Past Medical History:  Diagnosis Date   Bipolar disorder ([place])    Depression    Bipolar   GERD (gastroesophageal reflux disease)    History of hiatal hernia    History of kidney stones    History of stomach ulcers 1970s   "bleeding"   Hyperlipidemia    Hypertension    Hypothyroidism    Ischemic colitis, enteritis, or enterocolitis ([place])    OSA on CPAP    Pericarditis 05/06/2017   Pneumonia    "several times" (06/23/2017)   Thyroid disease    TIA (transient ischemic attack) 2011 X 2   Type II diabetes mellitus ([name])    Urinary bladder incontinence    Vertigo     Past Surgical History:  Procedure Laterality Date   APPENDECTOMY     BREAST CYST ASPIRATION Bilateral  CARDIAC CATHETERIZATION N/A 12/24/2014   Procedure: Right/Left Heart Cath and Coronary Angiography;  Surgeon: Sanda Klein, MD;  Location: [facility];  Service: Cardiovascular;  Laterality: N/A;   CATARACT EXTRACTION W/ INTRAOCULAR LENS  IMPLANT, BILATERAL Bilateral    EXCISIONAL HEMORRHOIDECTOMY     EYE SURGERY     FRACTURE SURGERY     IR THORACENTESIS ASP PLEURAL  SPACE W/IMG GUIDE  05/13/2017   LAPAROSCOPIC CHOLECYSTECTOMY     LITHOTRIPSY  "several times"   RETINAL DETACHMENT SURGERY     "think it was on the left; not sure" (06/23/2017)   TONSILLECTOMY     VAGINAL HYSTERECTOMY     partial     Family History  Problem Relation Age of Onset   Heart disease Mother    Stroke Father    Parkinson's disease Father    Cancer Sister    Cancer Brother     Social History:  reports that she has never smoked. She has never used smokeless tobacco. She reports that she does not drink alcohol and does not use drugs.    Review of Systems    NEUROPATHY:  Taking gabapentin for symptom relief      Lipids: Last LDL from PCP was 92 in 4/22, followed by PCP       Lab Results  Component Value Date   CHOL 149 03/26/2017   HDL 56 12/24/2019   LDLCALC 82 12/24/2019   TRIG 130 03/26/2017   CHOLHDL 3.0 03/26/2017       Thyroid:   Has had hypothyroidism for over 20 years, has not had a change in her 75 mcg levothyroxine dose in several years TSH history:   Lab Results  Component Value Date   TSH 1.526 08/20/2020   TSH 1.71 07/04/2020   TSH 3.12 10/02/2019   FREET4 1.04 05/05/2017   FREET4 0.86 01/31/2014   FREET4 0.91 08/28/2013       The blood pressure has been managed by PCP with a combination of diltiazem  and benicar 5 mg     BP Readings from Last 3 Encounters:  09/19/20 118/64  09/12/20 130/68  09/04/20 (!) 110/50     Physical Examination:  BP 118/64   Pulse 94   Ht _0  (1.6 m)   Wt 230 lb 9.6 oz (104.6 kg)   SpO2 99%   BMI 40.85 kg/m      ASSESSMENT/PLAN:   Diabetes type 2, with obesity  See history of present illness for detailed discussion of his current management, blood sugar patterns and problems identified  A1c is 7.8 most recently Fructosamine 225  She is on a V-go pump using NovoLog along with [place] as basal insulin 32 units daily and also [place] and Victoza  Blood sugar monitoring is is again done  mostly in the mornings or midday and difficult to assess postprandial readings  Although she thinks she is getting hypoglycemia after supper not clear if she has any documentation when she gets shaky   However she is agreeing to try the freestyle libre to help better assess her blood sugar patterns and adjust her insulin boluses as well as basal insulin requirement She has multiple other problems and multiple antipsychotic drugs which may be affecting her insulin action  Microalbumin normal  Recommendations: She will try to get the freestyle libre, given phone number for DME supplier She will be trained by the diabetes educator when this is available She will need to check her blood sugar when she  feels hypoglycemic Otherwise if she still has relatively low readings at night she will need to reduce her boluses Take injections of NovoLog if she is running out of boluses with her pump for mealtime coverage Again reminded her to reduce bedtime snacks especially high fat Continue to change in the pump consistently at the same time daily To take bolus clicks before eating Discussed appropriate treatment of hypoglycemia if documented as juice rather than food   Hypothyroidism: TSH normal and she will stay on 75 mcg levothyroxine     There are no Patient Instructions on file for this visit.  Total visit time including counseling = 30 minutes   Elayne Snare 09/19/2020, 9:41 AM   Note: This office note was prepared with Dragon voice recognition system technology. Any transcriptional errors that result from this process are unintentional.

## 2020-09-22 DIAGNOSIS — E063 Autoimmune thyroiditis: Secondary | ICD-10-CM | POA: Diagnosis not present

## 2020-09-22 DIAGNOSIS — I200 Unstable angina: Secondary | ICD-10-CM | POA: Diagnosis not present

## 2020-09-22 DIAGNOSIS — E785 Hyperlipidemia, unspecified: Secondary | ICD-10-CM | POA: Diagnosis not present

## 2020-09-22 DIAGNOSIS — E871 Hypo-osmolality and hyponatremia: Secondary | ICD-10-CM | POA: Diagnosis not present

## 2020-09-22 DIAGNOSIS — G4733 Obstructive sleep apnea (adult) (pediatric): Secondary | ICD-10-CM | POA: Diagnosis not present

## 2020-09-22 DIAGNOSIS — I5033 Acute on chronic diastolic (congestive) heart failure: Secondary | ICD-10-CM | POA: Diagnosis not present

## 2020-09-22 DIAGNOSIS — I110 Hypertensive heart disease with heart failure: Secondary | ICD-10-CM | POA: Diagnosis not present

## 2020-09-22 DIAGNOSIS — E119 Type 2 diabetes mellitus without complications: Secondary | ICD-10-CM | POA: Diagnosis not present

## 2020-09-23 DIAGNOSIS — I110 Hypertensive heart disease with heart failure: Secondary | ICD-10-CM | POA: Diagnosis not present

## 2020-09-23 DIAGNOSIS — I200 Unstable angina: Secondary | ICD-10-CM | POA: Diagnosis not present

## 2020-09-23 DIAGNOSIS — G4733 Obstructive sleep apnea (adult) (pediatric): Secondary | ICD-10-CM | POA: Diagnosis not present

## 2020-09-23 DIAGNOSIS — E785 Hyperlipidemia, unspecified: Secondary | ICD-10-CM | POA: Diagnosis not present

## 2020-09-23 DIAGNOSIS — E871 Hypo-osmolality and hyponatremia: Secondary | ICD-10-CM | POA: Diagnosis not present

## 2020-09-23 DIAGNOSIS — I5033 Acute on chronic diastolic (congestive) heart failure: Secondary | ICD-10-CM | POA: Diagnosis not present

## 2020-09-23 DIAGNOSIS — E119 Type 2 diabetes mellitus without complications: Secondary | ICD-10-CM | POA: Diagnosis not present

## 2020-09-23 DIAGNOSIS — E063 Autoimmune thyroiditis: Secondary | ICD-10-CM | POA: Diagnosis not present

## 2020-09-24 DIAGNOSIS — I5033 Acute on chronic diastolic (congestive) heart failure: Secondary | ICD-10-CM | POA: Diagnosis not present

## 2020-09-24 DIAGNOSIS — E871 Hypo-osmolality and hyponatremia: Secondary | ICD-10-CM | POA: Diagnosis not present

## 2020-09-24 DIAGNOSIS — E785 Hyperlipidemia, unspecified: Secondary | ICD-10-CM | POA: Diagnosis not present

## 2020-09-24 DIAGNOSIS — G4733 Obstructive sleep apnea (adult) (pediatric): Secondary | ICD-10-CM | POA: Diagnosis not present

## 2020-09-24 DIAGNOSIS — E119 Type 2 diabetes mellitus without complications: Secondary | ICD-10-CM | POA: Diagnosis not present

## 2020-09-24 DIAGNOSIS — I200 Unstable angina: Secondary | ICD-10-CM | POA: Diagnosis not present

## 2020-09-24 DIAGNOSIS — I110 Hypertensive heart disease with heart failure: Secondary | ICD-10-CM | POA: Diagnosis not present

## 2020-09-24 DIAGNOSIS — E063 Autoimmune thyroiditis: Secondary | ICD-10-CM | POA: Diagnosis not present

## 2020-09-26 DIAGNOSIS — E785 Hyperlipidemia, unspecified: Secondary | ICD-10-CM | POA: Diagnosis not present

## 2020-09-26 DIAGNOSIS — I110 Hypertensive heart disease with heart failure: Secondary | ICD-10-CM | POA: Diagnosis not present

## 2020-09-26 DIAGNOSIS — I200 Unstable angina: Secondary | ICD-10-CM | POA: Diagnosis not present

## 2020-09-26 DIAGNOSIS — E063 Autoimmune thyroiditis: Secondary | ICD-10-CM | POA: Diagnosis not present

## 2020-09-26 DIAGNOSIS — E119 Type 2 diabetes mellitus without complications: Secondary | ICD-10-CM | POA: Diagnosis not present

## 2020-09-26 DIAGNOSIS — E871 Hypo-osmolality and hyponatremia: Secondary | ICD-10-CM | POA: Diagnosis not present

## 2020-09-26 DIAGNOSIS — G4733 Obstructive sleep apnea (adult) (pediatric): Secondary | ICD-10-CM | POA: Diagnosis not present

## 2020-09-26 DIAGNOSIS — I5033 Acute on chronic diastolic (congestive) heart failure: Secondary | ICD-10-CM | POA: Diagnosis not present

## 2020-09-29 ENCOUNTER — Telehealth: Payer: Self-pay | Admitting: Cardiovascular Disease

## 2020-09-29 DIAGNOSIS — E119 Type 2 diabetes mellitus without complications: Secondary | ICD-10-CM | POA: Diagnosis not present

## 2020-09-29 DIAGNOSIS — I110 Hypertensive heart disease with heart failure: Secondary | ICD-10-CM | POA: Diagnosis not present

## 2020-09-29 DIAGNOSIS — I200 Unstable angina: Secondary | ICD-10-CM | POA: Diagnosis not present

## 2020-09-29 DIAGNOSIS — G4733 Obstructive sleep apnea (adult) (pediatric): Secondary | ICD-10-CM | POA: Diagnosis not present

## 2020-09-29 DIAGNOSIS — I5033 Acute on chronic diastolic (congestive) heart failure: Secondary | ICD-10-CM | POA: Diagnosis not present

## 2020-09-29 DIAGNOSIS — E063 Autoimmune thyroiditis: Secondary | ICD-10-CM | POA: Diagnosis not present

## 2020-09-29 DIAGNOSIS — E871 Hypo-osmolality and hyponatremia: Secondary | ICD-10-CM | POA: Diagnosis not present

## 2020-09-29 DIAGNOSIS — E785 Hyperlipidemia, unspecified: Secondary | ICD-10-CM | POA: Diagnosis not present

## 2020-09-29 MED ORDER — SPIRONOLACTONE 25 MG PO TABS: 25.0000 mg | ORAL_TABLET | Freq: Every day | ORAL | 3 refills | Status: DC

## 2020-09-29 MED ORDER — POTASSIUM CHLORIDE CRYS ER 10 MEQ PO TBCR: 10.0000 meq | EXTENDED_RELEASE_TABLET | Freq: Every day | ORAL | 3 refills | Status: DC

## 2020-09-29 NOTE — Telephone Encounter (Signed)
*  STAT* If patient is at the pharmacy, call can be transferred to refill team.   1. Which medications need to be refilled? (please list name of each medication and dose if known)  potassium chloride (KLOR-CON) 10 MEQ tablet spironolactone (ALDACTONE) 25 MG tablet   2. Which pharmacy/location (including street and city if local pharmacy) is medication to be sent to? [facility] [id number] - [place], [place] - [address]  3. Do they need a 30 day or 90 day supply? 90 day  Patient has 1 day left.

## 2020-09-30 MED ORDER — POTASSIUM CHLORIDE CRYS ER 10 MEQ PO TBCR: 10.0000 meq | EXTENDED_RELEASE_TABLET | Freq: Every day | ORAL | 3 refills | Status: DC

## 2020-09-30 MED ORDER — SPIRONOLACTONE 25 MG PO TABS: 25.0000 mg | ORAL_TABLET | Freq: Every day | ORAL | 3 refills | Status: DC

## 2020-09-30 NOTE — Telephone Encounter (Signed)
Refills has been resent. 

## 2020-10-01 DIAGNOSIS — I5033 Acute on chronic diastolic (congestive) heart failure: Secondary | ICD-10-CM | POA: Diagnosis not present

## 2020-10-01 DIAGNOSIS — E119 Type 2 diabetes mellitus without complications: Secondary | ICD-10-CM | POA: Diagnosis not present

## 2020-10-01 DIAGNOSIS — E063 Autoimmune thyroiditis: Secondary | ICD-10-CM | POA: Diagnosis not present

## 2020-10-01 DIAGNOSIS — E871 Hypo-osmolality and hyponatremia: Secondary | ICD-10-CM | POA: Diagnosis not present

## 2020-10-01 DIAGNOSIS — G4733 Obstructive sleep apnea (adult) (pediatric): Secondary | ICD-10-CM | POA: Diagnosis not present

## 2020-10-01 DIAGNOSIS — I110 Hypertensive heart disease with heart failure: Secondary | ICD-10-CM | POA: Diagnosis not present

## 2020-10-01 DIAGNOSIS — I200 Unstable angina: Secondary | ICD-10-CM | POA: Diagnosis not present

## 2020-10-01 DIAGNOSIS — E785 Hyperlipidemia, unspecified: Secondary | ICD-10-CM | POA: Diagnosis not present

## 2020-10-03 DIAGNOSIS — N182 Chronic kidney disease, stage 2 (mild): Secondary | ICD-10-CM | POA: Diagnosis not present

## 2020-10-03 DIAGNOSIS — E782 Mixed hyperlipidemia: Secondary | ICD-10-CM | POA: Diagnosis not present

## 2020-10-03 DIAGNOSIS — I129 Hypertensive chronic kidney disease with stage 1 through stage 4 chronic kidney disease, or unspecified chronic kidney disease: Secondary | ICD-10-CM | POA: Diagnosis not present

## 2020-10-03 DIAGNOSIS — I5032 Chronic diastolic (congestive) heart failure: Secondary | ICD-10-CM | POA: Diagnosis not present

## 2020-10-03 DIAGNOSIS — E1165 Type 2 diabetes mellitus with hyperglycemia: Secondary | ICD-10-CM | POA: Diagnosis not present

## 2020-10-03 DIAGNOSIS — Z23 Encounter for immunization: Secondary | ICD-10-CM | POA: Diagnosis not present

## 2020-10-06 ENCOUNTER — Other Ambulatory Visit: Payer: Self-pay | Admitting: Cardiovascular Disease

## 2020-10-06 DIAGNOSIS — I110 Hypertensive heart disease with heart failure: Secondary | ICD-10-CM | POA: Diagnosis not present

## 2020-10-06 DIAGNOSIS — I200 Unstable angina: Secondary | ICD-10-CM | POA: Diagnosis not present

## 2020-10-06 DIAGNOSIS — E785 Hyperlipidemia, unspecified: Secondary | ICD-10-CM | POA: Diagnosis not present

## 2020-10-06 DIAGNOSIS — E119 Type 2 diabetes mellitus without complications: Secondary | ICD-10-CM | POA: Diagnosis not present

## 2020-10-06 DIAGNOSIS — E063 Autoimmune thyroiditis: Secondary | ICD-10-CM | POA: Diagnosis not present

## 2020-10-06 DIAGNOSIS — G4733 Obstructive sleep apnea (adult) (pediatric): Secondary | ICD-10-CM | POA: Diagnosis not present

## 2020-10-06 DIAGNOSIS — I5033 Acute on chronic diastolic (congestive) heart failure: Secondary | ICD-10-CM | POA: Diagnosis not present

## 2020-10-06 DIAGNOSIS — E871 Hypo-osmolality and hyponatremia: Secondary | ICD-10-CM | POA: Diagnosis not present

## 2020-10-07 DIAGNOSIS — E119 Type 2 diabetes mellitus without complications: Secondary | ICD-10-CM | POA: Diagnosis not present

## 2020-10-13 DIAGNOSIS — G4733 Obstructive sleep apnea (adult) (pediatric): Secondary | ICD-10-CM | POA: Diagnosis not present

## 2020-10-13 DIAGNOSIS — I110 Hypertensive heart disease with heart failure: Secondary | ICD-10-CM | POA: Diagnosis not present

## 2020-10-13 DIAGNOSIS — E785 Hyperlipidemia, unspecified: Secondary | ICD-10-CM | POA: Diagnosis not present

## 2020-10-13 DIAGNOSIS — E119 Type 2 diabetes mellitus without complications: Secondary | ICD-10-CM | POA: Diagnosis not present

## 2020-10-13 DIAGNOSIS — E871 Hypo-osmolality and hyponatremia: Secondary | ICD-10-CM | POA: Diagnosis not present

## 2020-10-13 DIAGNOSIS — I5033 Acute on chronic diastolic (congestive) heart failure: Secondary | ICD-10-CM | POA: Diagnosis not present

## 2020-10-13 DIAGNOSIS — E063 Autoimmune thyroiditis: Secondary | ICD-10-CM | POA: Diagnosis not present

## 2020-10-13 DIAGNOSIS — I200 Unstable angina: Secondary | ICD-10-CM | POA: Diagnosis not present

## 2020-10-20 ENCOUNTER — Encounter: Payer: Medicare PPO | Attending: Endocrinology | Admitting: Nutrition

## 2020-10-20 ENCOUNTER — Other Ambulatory Visit: Payer: Self-pay

## 2020-10-20 DIAGNOSIS — E119 Type 2 diabetes mellitus without complications: Secondary | ICD-10-CM | POA: Diagnosis not present

## 2020-10-21 NOTE — Progress Notes (Signed)
Patient was trained on how to use the [employer].  The readings are going to a reader, because patient's phone was not able to support the app.  The readier was set with date and time.  We discussed the difference between sensor readings and blood sugar readings, and when it is necessary to test her blood sugars.  She reported good understanding of this. The Josephine Igo was inserted into her left upper outer arm and started on her reader.  She was shown how to scan the reader, and encouraged her to do this when first waking, ac meals and at HS to make sure the doctor sees a full 24 hours of readings.  She agreed to do this and had no final questions.

## 2020-10-21 NOTE — Patient Instructions (Signed)
Scan sensor when first wake up, before meals and at bedtime, or whenever you want to know what your blood sugar is doing. Call [phone] number if questions or problems.

## 2020-10-22 DIAGNOSIS — E063 Autoimmune thyroiditis: Secondary | ICD-10-CM | POA: Diagnosis not present

## 2020-10-22 DIAGNOSIS — G4733 Obstructive sleep apnea (adult) (pediatric): Secondary | ICD-10-CM | POA: Diagnosis not present

## 2020-10-22 DIAGNOSIS — I110 Hypertensive heart disease with heart failure: Secondary | ICD-10-CM | POA: Diagnosis not present

## 2020-10-22 DIAGNOSIS — E871 Hypo-osmolality and hyponatremia: Secondary | ICD-10-CM | POA: Diagnosis not present

## 2020-10-22 DIAGNOSIS — I5033 Acute on chronic diastolic (congestive) heart failure: Secondary | ICD-10-CM | POA: Diagnosis not present

## 2020-10-22 DIAGNOSIS — I200 Unstable angina: Secondary | ICD-10-CM | POA: Diagnosis not present

## 2020-10-22 DIAGNOSIS — E785 Hyperlipidemia, unspecified: Secondary | ICD-10-CM | POA: Diagnosis not present

## 2020-10-22 DIAGNOSIS — E119 Type 2 diabetes mellitus without complications: Secondary | ICD-10-CM | POA: Diagnosis not present

## 2020-10-24 DIAGNOSIS — E063 Autoimmune thyroiditis: Secondary | ICD-10-CM | POA: Diagnosis not present

## 2020-10-24 DIAGNOSIS — I5033 Acute on chronic diastolic (congestive) heart failure: Secondary | ICD-10-CM | POA: Diagnosis not present

## 2020-10-24 DIAGNOSIS — G4733 Obstructive sleep apnea (adult) (pediatric): Secondary | ICD-10-CM | POA: Diagnosis not present

## 2020-10-24 DIAGNOSIS — R350 Frequency of micturition: Secondary | ICD-10-CM | POA: Diagnosis not present

## 2020-10-24 DIAGNOSIS — E119 Type 2 diabetes mellitus without complications: Secondary | ICD-10-CM | POA: Diagnosis not present

## 2020-10-24 DIAGNOSIS — E785 Hyperlipidemia, unspecified: Secondary | ICD-10-CM | POA: Diagnosis not present

## 2020-10-24 DIAGNOSIS — I200 Unstable angina: Secondary | ICD-10-CM | POA: Diagnosis not present

## 2020-10-24 DIAGNOSIS — E871 Hypo-osmolality and hyponatremia: Secondary | ICD-10-CM | POA: Diagnosis not present

## 2020-10-24 DIAGNOSIS — I110 Hypertensive heart disease with heart failure: Secondary | ICD-10-CM | POA: Diagnosis not present

## 2020-10-30 ENCOUNTER — Other Ambulatory Visit (HOSPITAL_COMMUNITY): Payer: Self-pay

## 2020-11-02 ENCOUNTER — Other Ambulatory Visit: Payer: Self-pay | Admitting: Endocrinology

## 2020-11-04 ENCOUNTER — Other Ambulatory Visit (HOSPITAL_COMMUNITY): Payer: Self-pay

## 2020-11-05 DIAGNOSIS — N3941 Urge incontinence: Secondary | ICD-10-CM | POA: Diagnosis not present

## 2020-11-06 DIAGNOSIS — E119 Type 2 diabetes mellitus without complications: Secondary | ICD-10-CM | POA: Diagnosis not present

## 2020-11-07 ENCOUNTER — Telehealth: Payer: Self-pay | Admitting: Endocrinology

## 2020-11-07 NOTE — Telephone Encounter (Signed)
Pt calling about status of a PA for Victoza and/or find out if Dr Lucianne Muss prescribed the alternative that he discussed with her at her last visit  Patient wants a call back at [phone]

## 2020-11-10 ENCOUNTER — Other Ambulatory Visit (HOSPITAL_COMMUNITY): Payer: Self-pay

## 2020-11-13 ENCOUNTER — Telehealth: Payer: Self-pay | Admitting: Cardiovascular Disease

## 2020-11-13 NOTE — Telephone Encounter (Signed)
*  STAT* If patient is at the pharmacy, call can be transferred to refill team.   1. Which medications need to be refilled? (please list name of each medication and dose if known) furosemide (LASIX) 40 MG tablet  2. Which pharmacy/location (including street and city if local pharmacy) is medication to be sent to? [facility] [id number] - [place], [place] - [address]  3. Do they need a 30 day or 90 day supply? 90

## 2020-11-14 ENCOUNTER — Other Ambulatory Visit (INDEPENDENT_AMBULATORY_CARE_PROVIDER_SITE_OTHER): Payer: Medicare PPO

## 2020-11-14 ENCOUNTER — Other Ambulatory Visit: Payer: Self-pay

## 2020-11-14 DIAGNOSIS — E1165 Type 2 diabetes mellitus with hyperglycemia: Secondary | ICD-10-CM | POA: Diagnosis not present

## 2020-11-14 DIAGNOSIS — Z794 Long term (current) use of insulin: Secondary | ICD-10-CM

## 2020-11-14 LAB — BASIC METABOLIC PANEL
BUN: 19 mg/dL (ref 6–23)
CO2: 31 mEq/L (ref 19–32)
Calcium: 9.4 mg/dL (ref 8.4–10.5)
Chloride: 98 mEq/L (ref 96–112)
Creatinine, Ser: 1.11 mg/dL (ref 0.40–1.20)
GFR: 47.27 mL/min — ABNORMAL LOW (ref >60.00)
Glucose, Bld: 238 mg/dL — ABNORMAL HIGH (ref 70–99)
Potassium: 4.2 mEq/L (ref 3.5–5.1)
Sodium: 137 mEq/L (ref 135–145)

## 2020-11-14 LAB — HEMOGLOBIN A1C: Hgb A1c MFr Bld: 7.2 % — ABNORMAL HIGH (ref 4.6–6.5)

## 2020-11-20 DIAGNOSIS — N3281 Overactive bladder: Secondary | ICD-10-CM | POA: Diagnosis not present

## 2020-11-20 DIAGNOSIS — R8271 Bacteriuria: Secondary | ICD-10-CM | POA: Diagnosis not present

## 2020-11-20 DIAGNOSIS — N3000 Acute cystitis without hematuria: Secondary | ICD-10-CM | POA: Diagnosis not present

## 2020-11-21 ENCOUNTER — Encounter: Payer: Self-pay | Admitting: Endocrinology

## 2020-11-21 ENCOUNTER — Telehealth: Payer: Self-pay | Admitting: Endocrinology

## 2020-11-21 ENCOUNTER — Ambulatory Visit (INDEPENDENT_AMBULATORY_CARE_PROVIDER_SITE_OTHER): Payer: Medicare PPO | Admitting: Endocrinology

## 2020-11-21 ENCOUNTER — Other Ambulatory Visit: Payer: Self-pay

## 2020-11-21 VITALS — BP 134/74 | HR 76 | Ht 63.0 in | Wt 230.8 lb

## 2020-11-21 DIAGNOSIS — E1165 Type 2 diabetes mellitus with hyperglycemia: Secondary | ICD-10-CM

## 2020-11-21 DIAGNOSIS — Z794 Long term (current) use of insulin: Secondary | ICD-10-CM | POA: Diagnosis not present

## 2020-11-21 DIAGNOSIS — I10 Essential (primary) hypertension: Secondary | ICD-10-CM | POA: Diagnosis not present

## 2020-11-21 NOTE — Progress Notes (Signed)
Patient ID: [name], female   DOB: 12-Mar-1940, 80 y.o.   MRN: [id number]    Reason for Appointment: Followup for Type 2 Diabetes    History of Present Illness:          Diagnosis: Type 2 diabetes mellitus, date of diagnosis:2011       Past history: Since she was apparently intolerant to metformin she was treated with Onglyza until about 2014  At that time because of increasing A1c of about 8% she was started on Levemir 15 units and this was aggressively increased Onglyza was continued She has not tried any other treatments for diabetes Her A1c has been 10-10.5 in 2015 In 3/15 she was told to start small doses of NovoLog at lunch and supper and add 15 units of Levemir at night also To control  hyperglycemia with her basal bolus insulin regimen she was given Victoza in addition on her initial consultation in 5/15 She has been on Victoza since 8/15  She has been on the V-go pump since 09/2014  Recent history:   INSULIN regimen :  V-go pump 40 unit basal, boluses at meal times: 10-10/12   -12/14 units TRESIBA 32 units daily   Noninsulin hypoglycemic drugs the patient is taking are: Farxiga 10 mg, daily VICTOZA 1.8 mg  Her A1c is 7.2 compared to 7.8   Current management, blood sugar patterns and problems: She was able to start the freestyle libre for the last month or so  She thinks she feels better controlled with this and is able to keep up with her insulin better  However not clear why she is taking additional doses of NovoLog routinely at breakfast and bedtime even though she is not generally running out of clicks on her pump  Also she is sometimes bolusing 30 minutes after eating  Surprisingly even with taking extra NovoLog at bedtime without substantial snack she does not have low sugars except on 1 or 2 occasions  Also in the mornings she is eating mostly protein and only occasionally cereal but with the extra insulin bolus she is not getting low  Overall  blood sugars are still high overnight but rising early morning  She is not adjusting her insulin very much maybe only 2 units based on what she is eating Taking her [place] and Victoza regularly She changes her pump at the same time daily at 2-3 pm She is not able to do much walking because of shortness of breath    Meals: 3 meals per day.  Breakfast: Oatmeal or egg or sausage at 8-9 am sometimes with sausage for protein, dinner 5 pm.;   Night snacks will be fruit or peanut butter crackers    Side effects from medications have been: Metformin: rash   CONTINUOUS GLUCOSE MONITORING RECORD INTERPRETATION    Dates of Recording: 10/22 through 11/4  Sensor description: Freestyle libre version 2  Results statistics:   CGM use % of time   Average and SD   Time in range   78     %  % Time Above 180 21+1  % Time above 250   % Time Below target     PRE-MEAL Fasting Lunch Dinner Bedtime Overall  Glucose range:       Mean/median: 150 161 158 129    POST-MEAL PC Breakfast PC Lunch PC Dinner  Glucose range:     Mean/median: 164 1 166 142    Glycemic patterns summary: Blood sugars are on an  average within the target range throughout the day with tendency to high readings at all different times at times  Hyperglycemic episodes are occurring sporadically mid mornings, around 5 PM and occasionally overnight  Hypoglycemic episodes occurred rarely with only 1 documented low sugar around 10 PM Lowest blood sugar also after 10 PM  Overnight periods: Blood sugars are generally variable with occasionally going up to over 200 but at times low normal and also but averaging mostly around 150  Preprandial periods: Blood sugars are mildly increased at all times around 160 average  Postprandial periods:   After breakfast:   On an average blood sugars are not rising excessively with occasional high readings After lunch:   Generally blood sugars are well controlled with less variability After  dinner: Blood sugars after her evening meal are trending lower by about 9-10 PM   Previous data:    PRE-MEAL Fasting Lunch Dinner Bedtime Overall  Glucose range: 140-212  111  110-267  Mean/median: 171    170   POST-MEAL PC Breakfast PC Lunch PC Dinner  Glucose range:     Mean/median:  181      Glycemic control:    Lab Results  Component Value Date   HGBA1C 7.2 (H) 11/14/2020   HGBA1C 7.8 (H) 07/04/2020   HGBA1C 7.5 (H) 03/28/2020   Lab Results  Component Value Date   MICROALBUR <0.7 09/19/2020   LDLCALC 82 12/24/2019   CREATININE 1.11 11/14/2020   Lab Results  Component Value Date   FRUCTOSAMINE 223 09/16/2020   FRUCTOSAMINE 259 10/17/2017   FRUCTOSAMINE 252 08/17/2016     Self-care: The diet that the patient has been following is: tries to limit fats            Dietician visit: Most recent: 2/19   CDE visit: 9/16    Weight history:  Wt Readings from Last 3 Encounters:  11/21/20 230 lb 12.8 oz (104.7 kg)  09/19/20 230 lb 9.6 oz (104.6 kg)  09/12/20 233 lb 9.6 oz (106 kg)    Other active problems: See review of systems    Allergies as of 11/21/2020       Reactions   Flagyl [metronidazole Hcl] Itching, Rash   Ciprofloxacin Itching, Rash   Metformin And Related Rash   Methimazole Rash   Milk-related Compounds Other (See Comments)   Stomach pains   Other Other (See Comments)   [place] nuts cause severe facial redness        Medication List        Accurate as of November 21, 2020  3:02 PM. If you have any questions, ask your nurse or doctor.          Accu-Chek Guide test strip Generic drug: glucose blood Use 1-4 times daily as needed/instructed DX E11.9   Accu-Chek Guide w/Device Kit Use 1-4 times daily as needed/directed  DX E11.9   aspirin 81 MG EC tablet Take 1 tablet (81 mg total) by mouth daily.   carbamazepine 200 MG 12 hr capsule Commonly known as: CARBATROL Take 200 mg by mouth 2 (two) times daily.   clobetasol 0.05 %  Gel Commonly known as: TEMOVATE Apply 1 application topically 2 (two) times daily as needed (rash).   diltiazem 240 MG 24 hr capsule Commonly known as: CARDIZEM CD Take 1 capsule (240 mg total) by mouth at bedtime.   donepezil 10 MG tablet Commonly known as: ARICEPT Take 10 mg by mouth at bedtime.   esomeprazole 40 MG capsule Commonly  known as: NexIUM Take 1 capsule (40 mg total) by mouth daily at 12 noon.   famotidine 20 MG tablet Commonly known as: PEPCID Take 20 mg by mouth 2 (two) times daily.   Farxiga 10 MG Tabs tablet Generic drug: dapagliflozin propanediol TAKE ONE TABLET BY MOUTH DAILY   folic acid 1 MG tablet Commonly known as: FOLVITE Take 1 mg by mouth daily.   FreeStyle Libre 2 Reader Kerrin Mo Use to check blood sugar daily   FreeStyle Libre 2 Sensor Misc Use to check blood sugar. Change every 14 days   furosemide 40 MG tablet Commonly known as: LASIX Take 1 tablet (40 mg total) by mouth 2 (two) times daily.   gabapentin 300 MG capsule Commonly known as: NEURONTIN Take 300 mg by mouth at bedtime.   Insulin Aspart FlexPen 100 UNIT/ML Commonly known as: NOVOLOG INJECT 14 TO 16 UNITS BEFORE MEALS AS DIRECTED   Insulin Pen Needle 33G X 5 MM Misc Commonly known as: Comfort EZ Pen Needles 1 each by Does not apply route daily. Use with Tresiba pen   Insulin Pen Needle 32G X 5 MM Misc 1 each by Does not apply route daily. Use with Tyler Aas pen   BD Pen Needle Nano U/F 32G X 4 MM Misc Generic drug: Insulin Pen Needle USE 8 PEN NEEDLES PER DAY   isosorbide mononitrate 30 MG 24 hr tablet Commonly known as: IMDUR Take 1 tablet (30 mg total) by mouth daily.   levothyroxine 75 MCG tablet Commonly known as: SYNTHROID Take 75 mcg by mouth daily before breakfast.   methotrexate 2.5 MG tablet Commonly known as: RHEUMATREX Take 10 mg by mouth every Monday. For rash   metolazone 2.5 MG tablet Commonly known as: ZAROXOLYN Take one tablet once a week for a  weight over 235 pounds. Do not take more than once a week without provider approval.   montelukast 5 MG chewable tablet Commonly known as: SINGULAIR Chew 5 mg by mouth at bedtime.   OLANZapine 7.5 MG tablet Commonly known as: ZYPREXA Take 15 mg by mouth at bedtime.   olmesartan 5 MG tablet Commonly known as: BENICAR Take 5 mg by mouth daily.   ondansetron 4 MG tablet Commonly known as: ZOFRAN Take 4 mg by mouth every 8 (eight) hours as needed for nausea or vomiting.   OneTouch Delica Lancets 22Q Misc USE LANCET TO TEST FIVE TIMES A DAY   Accu-Chek Softclix Lancets lancets Use 1-4 times daily as needed/instructed  DX E11.9   potassium chloride 10 MEQ tablet Commonly known as: KLOR-CON Take 1 tablet (10 mEq total) by mouth daily.   pravastatin 20 MG tablet Commonly known as: PRAVACHOL Take 20 mg by mouth daily.   QC Vitamin D3 50 MCG (2000 UT) Tabs Generic drug: Cholecalciferol Take 2,000 Units by mouth daily.   QUEtiapine 25 MG tablet Commonly known as: SEROQUEL Take 100 mg by mouth at bedtime.   spironolactone 25 MG tablet Commonly known as: Aldactone Take 1 tablet (25 mg total) by mouth daily.   Tyler Aas FlexTouch 100 UNIT/ML FlexTouch Pen Generic drug: insulin degludec Inject 32 Units into the skin daily. Takes daily in the AM   V-Go 40 Kit USE 1 KIT DAILY AS DIRECTED   Victoza 18 MG/3ML Sopn Generic drug: liraglutide DIAL AND INJECT UNDER THE SKIN 1.8 MG DAILY        Allergies:  Allergies  Allergen Reactions   Flagyl [Metronidazole Hcl] Itching and Rash   Ciprofloxacin Itching and Rash  Metformin And Related Rash   Methimazole Rash   Milk-Related Compounds Other (See Comments)    Stomach pains   Other Other (See Comments)    [place] nuts cause severe facial redness    Past Medical History:  Diagnosis Date   Bipolar disorder ([place])    Depression    Bipolar   GERD (gastroesophageal reflux disease)    History of hiatal hernia    History of  kidney stones    History of stomach ulcers 1970s   "bleeding"   Hyperlipidemia    Hypertension    Hypothyroidism    Ischemic colitis, enteritis, or enterocolitis ([place])    OSA on CPAP    Pericarditis 05/06/2017   Pneumonia    "several times" (06/23/2017)   Thyroid disease    TIA (transient ischemic attack) 2011 X 2   Type II diabetes mellitus ([place])    Urinary bladder incontinence    Vertigo     Past Surgical History:  Procedure Laterality Date   APPENDECTOMY     BREAST CYST ASPIRATION Bilateral    CARDIAC CATHETERIZATION N/A 12/24/2014   Procedure: Right/Left Heart Cath and Coronary Angiography;  Surgeon: Sanda Klein, MD;  Location: [facility];  Service: Cardiovascular;  Laterality: N/A;   CATARACT EXTRACTION W/ INTRAOCULAR LENS  IMPLANT, BILATERAL Bilateral    EXCISIONAL HEMORRHOIDECTOMY     EYE SURGERY     FRACTURE SURGERY     IR THORACENTESIS ASP PLEURAL SPACE W/IMG GUIDE  05/13/2017   LAPAROSCOPIC CHOLECYSTECTOMY     LITHOTRIPSY  "several times"   RETINAL DETACHMENT SURGERY     "think it was on the left; not sure" (06/23/2017)   TONSILLECTOMY     VAGINAL HYSTERECTOMY     partial     Family History  Problem Relation Age of Onset   Heart disease Mother    Stroke Father    Parkinson's disease Father    Cancer Sister    Cancer Brother     Social History:  reports that she has never smoked. She has never used smokeless tobacco. She reports that she does not drink alcohol and does not use drugs.    Review of Systems    NEUROPATHY:  Taking gabapentin for symptom relief      HYPERLIPIDEMIA: Last LDL from PCP was 92 in 4/22, followed by PCP Taking pravastatin 20 mg daily      Lab Results  Component Value Date   CHOL 149 03/26/2017   HDL 56 12/24/2019   LDLCALC 82 12/24/2019   TRIG 130 03/26/2017   CHOLHDL 3.0 03/26/2017       Thyroid:   Has had hypothyroidism for over 20 years, has not had a change in her 75 mcg levothyroxine dose in several  years TSH history:   Lab Results  Component Value Date   TSH 1.526 08/20/2020   TSH 1.71 07/04/2020   TSH 3.12 10/02/2019   FREET4 1.04 05/05/2017   FREET4 0.86 01/31/2014   FREET4 0.91 08/28/2013       The blood pressure has been managed by PCP with a combination of diltiazem  and benicar 5 mg     BP Readings from Last 3 Encounters:  11/21/20 134/74  09/19/20 118/64  09/12/20 130/68     Physical Examination:  BP 134/74   Pulse 76   Ht 5' 3"  (1.6 m)   Wt 230 lb 12.8 oz (104.7 kg)   SpO2 95%   BMI 40.88 kg/m   No ankle  edema She has whitish discoloration of the first toenail on the right and also on the edge of the left first toenail without excessive thickening  ASSESSMENT/PLAN:   Diabetes type 2, with obesity  See history of present illness for detailed discussion of his current management, blood sugar patterns and problems identified  A1c is 7.2 most recently   She is on a V-go pump using NovoLog along with [place] as basal insulin 32 units daily and also [place] and Victoza  Blood sugar patterns are assessed by the freestyle libre now Although generally her blood sugars are adequate with 78% within target range she does have some variability and generally high fasting readings again  Timing of her NovoLog insulin, adjustment based on her meal size and carbohydrates was discussed in detail   Microalbumin normal  Recommendations:  No Novolog to be taken in the morning except for cereal, she will only use the 5 or 6 clicks for breakfast  Clicks to be done for bolusing BEFORE starting meals She will not take any NovoLog at bedtime  Go up to 34 units [place] and if fasting blood sugar stays >140 go to 36 units  Lamisil cream for possible toenail fungus on the first toe of the left foot although she needs to likely discuss with her PCP if not better  Avoid overtreating low sugars and consume only juice/crackers for lows instead of adding more food and  peanut butter  She will continue to change her pump at the same time daily   Total visit time including counseling is 30 minutes     Patient Instructions  No Novolog except for cereal  Clicks BEFORE starting meals  34 Tresiba and if am stays >140 go to 36  Lamisil cream  Only juice/crackers for lows   Elayne Snare 11/21/2020, 3:02 PM   Note: This office note was prepared with Dragon voice recognition system technology. Any transcriptional errors that result from this process are unintentional.

## 2020-11-21 NOTE — Telephone Encounter (Signed)
They were faxed about 20 minutes ago

## 2020-11-21 NOTE — Patient Instructions (Addendum)
No Novolog except for cereal  Clicks BEFORE starting meals  34 Tresiba and if am stays >140 go to 36  Lamisil cream  Only juice/crackers for lows

## 2020-11-21 NOTE — Telephone Encounter (Signed)
[facility] calling to request recent chart notes. Fax back to [phone]

## 2020-11-27 ENCOUNTER — Other Ambulatory Visit: Payer: Self-pay | Admitting: Endocrinology

## 2020-12-02 ENCOUNTER — Telehealth: Payer: Self-pay | Admitting: Endocrinology

## 2020-12-02 NOTE — Telephone Encounter (Signed)
Khaliah from [facility] calling in to request a new work order (prescription) for a CGM machine for patient. Last order is expired.   [facility] fax number [phone]

## 2020-12-04 NOTE — Telephone Encounter (Signed)
Order has been faxed over 

## 2020-12-06 DIAGNOSIS — E119 Type 2 diabetes mellitus without complications: Secondary | ICD-10-CM | POA: Diagnosis not present

## 2020-12-08 ENCOUNTER — Other Ambulatory Visit: Payer: Self-pay

## 2020-12-08 ENCOUNTER — Telehealth: Payer: Self-pay | Admitting: Nutrition

## 2020-12-08 ENCOUNTER — Telehealth: Payer: Self-pay | Admitting: Cardiovascular Disease

## 2020-12-08 MED ORDER — FUROSEMIDE 40 MG PO TABS: 40.0000 mg | ORAL_TABLET | Freq: Two times a day (BID) | ORAL | 0 refills | Status: DC

## 2020-12-08 NOTE — Telephone Encounter (Signed)
*  STAT* If patient is at the pharmacy, call can be transferred to refill team.   1. Which medications need to be refilled? (please list name of each medication and dose if known) furosemide (LASIX) 40 MG tablet  2. Which pharmacy/location (including street and city if local pharmacy) is medication to be sent to? [facility] [id number] - [place], [place] - [address]  3. Do they need a 30 day or 90 day supply? 90

## 2020-12-08 NOTE — Telephone Encounter (Signed)
Patient on last LIbre 2 and needs refill Says [facility] sent a form to [place] for this.  Please rush it back.  Thank you

## 2020-12-09 NOTE — Telephone Encounter (Signed)
Patient called back saying they need additional information, and something was faxed over yesterday from them.  She is not sure what this is???

## 2020-12-10 ENCOUNTER — Telehealth: Payer: Self-pay | Admitting: Endocrinology

## 2020-12-10 NOTE — Telephone Encounter (Signed)
Patient is calling to say that she still has not been able to get her prescription for FreeStyle Libre 2 and wants to make sure that the fax number is correct.  The fax number for [facility] that she has is [phone] and patient says that have not received the prescription.  Patient uses [facility].  Continuous Blood Gluc Sensor (FREESTYLE LIBRE 2 SENSOR) MISC   [id number] - [place], [place] - [address] Phone:  [phone]  Fax:  [phone]

## 2020-12-15 NOTE — Telephone Encounter (Signed)
Pt called back and needs a response ASAP.

## 2020-12-15 NOTE — Telephone Encounter (Signed)
Spoke with patient faxed to number below as well as refaxed to number on order

## 2020-12-15 NOTE — Telephone Encounter (Signed)
Order has been refaxed.  

## 2020-12-22 DIAGNOSIS — E1165 Type 2 diabetes mellitus with hyperglycemia: Secondary | ICD-10-CM | POA: Diagnosis not present

## 2020-12-25 ENCOUNTER — Other Ambulatory Visit: Payer: Self-pay

## 2020-12-25 ENCOUNTER — Encounter (INDEPENDENT_AMBULATORY_CARE_PROVIDER_SITE_OTHER): Payer: Medicare PPO | Admitting: Ophthalmology

## 2020-12-25 DIAGNOSIS — H35033 Hypertensive retinopathy, bilateral: Secondary | ICD-10-CM

## 2020-12-25 DIAGNOSIS — H43813 Vitreous degeneration, bilateral: Secondary | ICD-10-CM | POA: Diagnosis not present

## 2020-12-25 DIAGNOSIS — I10 Essential (primary) hypertension: Secondary | ICD-10-CM

## 2020-12-25 DIAGNOSIS — E113293 Type 2 diabetes mellitus with mild nonproliferative diabetic retinopathy without macular edema, bilateral: Secondary | ICD-10-CM | POA: Diagnosis not present

## 2020-12-25 DIAGNOSIS — H338 Other retinal detachments: Secondary | ICD-10-CM

## 2020-12-26 ENCOUNTER — Encounter: Payer: Self-pay | Admitting: Cardiovascular Disease

## 2020-12-26 ENCOUNTER — Ambulatory Visit (INDEPENDENT_AMBULATORY_CARE_PROVIDER_SITE_OTHER): Payer: Medicare PPO | Admitting: Cardiovascular Disease

## 2020-12-26 VITALS — BP 113/67 | HR 93 | Ht 63.0 in | Wt 223.8 lb

## 2020-12-26 DIAGNOSIS — E119 Type 2 diabetes mellitus without complications: Secondary | ICD-10-CM

## 2020-12-26 DIAGNOSIS — I10 Essential (primary) hypertension: Secondary | ICD-10-CM | POA: Diagnosis not present

## 2020-12-26 DIAGNOSIS — Z794 Long term (current) use of insulin: Secondary | ICD-10-CM | POA: Diagnosis not present

## 2020-12-26 DIAGNOSIS — G4733 Obstructive sleep apnea (adult) (pediatric): Secondary | ICD-10-CM | POA: Diagnosis not present

## 2020-12-26 DIAGNOSIS — Z8679 Personal history of other diseases of the circulatory system: Secondary | ICD-10-CM | POA: Diagnosis not present

## 2020-12-26 DIAGNOSIS — I5032 Chronic diastolic (congestive) heart failure: Secondary | ICD-10-CM

## 2020-12-26 DIAGNOSIS — E782 Mixed hyperlipidemia: Secondary | ICD-10-CM

## 2020-12-26 NOTE — Progress Notes (Signed)
Cardiology Office Note    Date:  12/27/2020   ID:  Mary, [name] 06-02-1940, MRN [id number]  PCP:  Mary Seashore, MD  Cardiologist:   Mary Klein, MD   Chief Complaint  Patient presents with   Congestive Heart Failure           History of Present Illness:  [name] is a 80 y.o. female with obesity, type 2 diabetes mellitus, previous history of TIA and possible ischemic colitis, hypertension. She has normal left ventricular systolic function and regional wall motion without valvular abnormalities.  She had a protracted course of acute pericarditis throughout the first half of 2019.  Despite consistently normal levels of BMP, she does have intermittent shortness of breath and lower extremity edema that responds well to diuretic therapy.  However, treatment with combined metolazone-loop diuretics has led to significant hypovolemia and acute kidney injury.  Cardiac MRI in April 2019 showed normal left ventricular systolic function with mild LVH and no evidence of late gadolinium enhancement.  Echo on August 10, 2017 showed complete resolution of the pericardial effusion.  The most recent echo in May 2022 showed a hyperdynamic LV with EF of 70-75%, moderate concentric LVH and grade 1 diastolic dysfunction without any pericardial effusion or valvular abnormalities.  She had normal coronary arteries by catheterization in 2016.  She was hospitalized from August 03 through August 10/07/2020 with pneumonia.  Her presentation was actually with abdominal pain and shortness of breath.  During her hospital stay she was treated aggressively with diuretics and reportedly diuresed 7 L.  Discharge weight was 227 pounds.  CT of the abdomen and pelvis showed fatty liver changes, constipation and diverticulosis, but was otherwise benign.  Glycemic control has improved substantially since she began using a freestyle libre device.  She is probably lost at least 10 pounds.  Her current  "dry weight" is uncertain.  She feels well today and does not have edema or dyspnea at a weight of 223 pounds.  Her blood pressure is normal today.  As before, she is relatively tachycardic at rest with a heart rate of 93 bpm (sinus rhythm.  A log of her blood pressure at home show substantial variability, but the most common readings appear to be in the 120s-130s/70s-80s.  Highest reading recorded was 179/105, but just a few hours later the blood pressure was down to 121/84.   Past Medical History:  Diagnosis Date   Bipolar disorder ([name])    Depression    Bipolar   GERD (gastroesophageal reflux disease)    History of hiatal hernia    History of kidney stones    History of stomach ulcers 1970s   "bleeding"   Hyperlipidemia    Hypertension    Hypothyroidism    Ischemic colitis, enteritis, or enterocolitis ([place])    OSA on CPAP    Pericarditis 05/06/2017   Pneumonia    "several times" (06/23/2017)   Thyroid disease    TIA (transient ischemic attack) 2011 X 2   Type II diabetes mellitus ([place])    Urinary bladder incontinence    Vertigo     Past Surgical History:  Procedure Laterality Date   APPENDECTOMY     BREAST CYST ASPIRATION Bilateral    CARDIAC CATHETERIZATION N/A 12/24/2014   Procedure: Right/Left Heart Cath and Coronary Angiography;  Surgeon: Mary Klein, MD;  Location: [facility];  Service: Cardiovascular;  Laterality: N/A;   CATARACT EXTRACTION W/ INTRAOCULAR LENS  IMPLANT, BILATERAL Bilateral  EXCISIONAL HEMORRHOIDECTOMY     EYE SURGERY     FRACTURE SURGERY     IR THORACENTESIS ASP PLEURAL SPACE W/IMG GUIDE  05/13/2017   LAPAROSCOPIC CHOLECYSTECTOMY     LITHOTRIPSY  "several times"   RETINAL DETACHMENT SURGERY     "think it was on the left; not sure" (06/23/2017)   TONSILLECTOMY     VAGINAL HYSTERECTOMY     partial     Current Medications: Outpatient Medications Prior to Visit  Medication Sig Dispense Refill   Accu-Chek Softclix Lancets lancets Use 1-4  times daily as needed/instructed  DX E11.9 360 each 3   aspirin EC 81 MG EC tablet Take 1 tablet (81 mg total) by mouth daily. 30 tablet 0   Blood Glucose Monitoring Suppl (ACCU-CHEK GUIDE) w/Device KIT Use 1-4 times daily as needed/directed  DX E11.9 1 kit 1   carbamazepine (CARBATROL) 200 MG 12 hr capsule Take 200 mg by mouth 2 (two) times daily.      Cholecalciferol (QC VITAMIN D3) 50 MCG (2000 UT) TABS Take 2,000 Units by mouth daily.     clobetasol (TEMOVATE) 0.05 % GEL Apply 1 application topically 2 (two) times daily as needed (rash).      Continuous Blood Gluc Receiver (FREESTYLE LIBRE 2 READER) DEVI Use to check blood sugar daily 1 each 0   Continuous Blood Gluc Sensor (FREESTYLE LIBRE 2 SENSOR) MISC Use to check blood sugar. Change every 14 days 2 each 3   diltiazem (CARDIZEM CD) 240 MG 24 hr capsule Take 1 capsule (240 mg total) by mouth at bedtime. 30 capsule 6   donepezil (ARICEPT) 10 MG tablet Take 10 mg by mouth at bedtime.     esomeprazole (NEXIUM) 40 MG capsule Take 1 capsule (40 mg total) by mouth daily at 12 noon. 30 capsule 2   famotidine (PEPCID) 20 MG tablet Take 20 mg by mouth 2 (two) times daily.     FARXIGA 10 MG TABS tablet TAKE ONE TABLET BY MOUTH DAILY 30 tablet 5   folic acid (FOLVITE) 1 MG tablet Take 1 mg by mouth daily.     furosemide (LASIX) 40 MG tablet Take 1 tablet (40 mg total) by mouth 2 (two) times daily. 60 tablet 0   gabapentin (NEURONTIN) 300 MG capsule Take 300 mg by mouth at bedtime.     glucose blood (ACCU-CHEK GUIDE) test strip Use 1-4 times daily as needed/instructed DX E11.9 300 each 12   Insulin Aspart FlexPen (NOVOLOG) 100 UNIT/ML INJECT 14 TO 16 UNITS BEFORE MEALS AS DIRECTED 15 mL 1   insulin degludec (TRESIBA FLEXTOUCH) 100 UNIT/ML FlexTouch Pen Inject 32 Units into the skin daily. Takes daily in the AM 15 mL 5   Insulin Disposable Pump (V-GO 40) KIT USE 1 KIT DAILY AS DIRECTED 1 kit 5   Insulin Pen Needle (BD PEN NEEDLE NANO U/F) 32G X 4 MM  MISC USE 8 PEN NEEDLES PER DAY 200 each 0   Insulin Pen Needle (COMFORT EZ PEN NEEDLES) 33G X 5 MM MISC 1 each by Does not apply route daily. Use with Tresiba pen 100 each 0   Insulin Pen Needle 32G X 5 MM MISC 1 each by Does not apply route daily. Use with Tresiba pen 100 each 0   levothyroxine (SYNTHROID, LEVOTHROID) 75 MCG tablet Take 75 mcg by mouth daily before breakfast.     liraglutide (VICTOZA) 18 MG/3ML SOPN DIAL AND INJECT UNDER THE SKIN 1.8 MG DAILY 9 mL 3  methotrexate (RHEUMATREX) 2.5 MG tablet Take 10 mg by mouth every Monday. For rash     montelukast (SINGULAIR) 5 MG chewable tablet Chew 5 mg by mouth at bedtime.     OLANZapine (ZYPREXA) 7.5 MG tablet Take 15 mg by mouth at bedtime.     olmesartan (BENICAR) 5 MG tablet Take 5 mg by mouth daily.     ONETOUCH DELICA LANCETS 16X MISC USE LANCET TO TEST FIVE TIMES A DAY 200 each 0   pravastatin (PRAVACHOL) 20 MG tablet Take 20 mg by mouth daily.     QUEtiapine (SEROQUEL) 25 MG tablet Take 100 mg by mouth at bedtime.     spironolactone (ALDACTONE) 25 MG tablet Take 1 tablet (25 mg total) by mouth daily. 90 tablet 3   isosorbide mononitrate (IMDUR) 30 MG 24 hr tablet Take 1 tablet (30 mg total) by mouth daily. 90 tablet 3   potassium chloride (KLOR-CON) 10 MEQ tablet Take 1 tablet (10 mEq total) by mouth daily. 90 tablet 3   HYDROcodone-acetaminophen (NORCO/VICODIN) 5-325 MG tablet Take 1 tablet by mouth daily as needed. (Patient not taking: Reported on 12/26/2020)     metolazone (ZAROXOLYN) 2.5 MG tablet Take one tablet once a week for a weight over 235 pounds. Do not take more than once a week without provider approval. (Patient not taking: Reported on 12/26/2020) 10 tablet 0   ondansetron (ZOFRAN) 4 MG tablet Take 4 mg by mouth every 8 (eight) hours as needed for nausea or vomiting. (Patient not taking: Reported on 12/26/2020)     No facility-administered medications prior to visit.     Allergies:   Flagyl [metronidazole hcl],  Ciprofloxacin, Metformin and related, Methimazole, Milk-related compounds, and Other   Social History   Socioeconomic History   Marital status: Widowed    Spouse name: Not on file   Number of children: 2   Years of education: 67   Highest education level: Not on file  Occupational History   Occupation: Retired  Tobacco Use   Smoking status: Never   Smokeless tobacco: Never  Vaping Use   Vaping Use: Never used  Substance and Sexual Activity   Alcohol use: Never   Drug use: Never   Sexual activity: Not on file  Other Topics Concern   Not on file  Social History Narrative   Lives at home alone.   Right-handed.   No caffeine use.   Social Determinants of Health   Financial Resource Strain: Not on file  Food Insecurity: Not on file  Transportation Needs: Not on file  Physical Activity: Not on file  Stress: Not on file  Social Connections: Not on file     Family History:  The patient's family history includes Cancer in her brother and sister; Heart disease in her mother; Parkinson's disease in her father; Stroke in her father.   ROS:   Please see the history of present illness.   All other systems are reviewed and are negative.   PHYSICAL EXAM:   VS:  BP 113/67 (BP Location: Left Arm, Patient Position: Sitting, Cuff Size: Large)   Pulse 93   Ht $R'5\' 3"'zf$  (1.6 m)   Wt 223 lb 12.8 oz (101.5 kg)   SpO2 96%   BMI 39.64 kg/m      General: Alert, oriented x3, no distress, severely obese Head: no evidence of trauma, PERRL, EOMI, no exophtalmos or lid lag, no myxedema, no xanthelasma; normal ears, nose and oropharynx Neck: normal jugular venous pulsations and no  hepatojugular reflux; brisk carotid pulses without delay and no carotid bruits Chest: clear to auscultation, no signs of consolidation by percussion or palpation, normal fremitus, symmetrical and full respiratory excursions Cardiovascular: normal position and quality of the apical impulse, regular rhythm, normal first  and second heart sounds, no murmurs, rubs or gallops Abdomen: no tenderness or distention, no masses by palpation, no abnormal pulsatility or arterial bruits, normal bowel sounds, no hepatosplenomegaly Extremities: no clubbing, cyanosis; trivial symmetrical ankle edema; 2+ radial, ulnar and brachial pulses bilaterally; 2+ right femoral, posterior tibial and dorsalis pedis pulses; 2+ left femoral, posterior tibial and dorsalis pedis pulses; no subclavian or femoral bruits Neurological: grossly nonfocal Psych: Normal mood and affect    Wt Readings from Last 3 Encounters:  12/26/20 223 lb 12.8 oz (101.5 kg)  11/21/20 230 lb 12.8 oz (104.7 kg)  09/19/20 230 lb 9.6 oz (104.6 kg)      Studies/Labs Reviewed:   ECHO 05/20/2020:  1. Left ventricular ejection fraction, by estimation, is 70 to 75%. The left ventricle has hyperdynamic function. The left ventricle has no regional wall motion abnormalities. There is moderate concentric left ventricular hypertrophy. Left ventricular  diastolic parameters are consistent with Grade I diastolic dysfunction (impaired relaxation).   2. Right ventricular systolic function is normal. The right ventricular size is normal. Tricuspid regurgitation signal is inadequate for assessing PA pressure.   3. The mitral valve is degenerative. Trivial mitral valve regurgitation. No evidence of mitral stenosis.   4. The aortic valve is tricuspid. There is mild calcification of the  aortic valve. Aortic valve regurgitation is not visualized. No aortic  stenosis is present.   Comparison(s): No significant change from prior study.   EKG:  EKG is not  ordered today.  The most recent tracing from 08/21/2020 shows normal sinus rhythm and left axis deviation at about -30 degrees and poor R wave progression in the anterior leads, isolated T wave inversion in leads I and aVL (in the past she has had full-blown left anterior fascicular block)  Recent Labs: 08/20/2020: TSH  1.526 08/23/2020: ALT 25 08/25/2020: Hemoglobin 14.0; Platelets 307 09/04/2020: BNP 17.9; Magnesium 2.2 11/14/2020: BUN 19; Creatinine, Ser 1.11; Potassium 4.2; Sodium 137   Lipid Panel    Component Value Date/Time   CHOL 149 03/26/2017 1710   TRIG 130 03/26/2017 1710   HDL 56 12/24/2019 0000   CHOLHDL 3.0 03/26/2017 1710   VLDL 26 03/26/2017 1710   LDLCALC 82 12/24/2019 0000    05/14/2020 Cholesterol 179, HDL 53, LDL 92, triglycerides 198 Hemoglobin A1c 7.5% Creatinine 1.04 Normal liver function tests 12/18/2019 TSH 2.010  ASSESSMENT:    1. Chronic diastolic heart failure ([place])   2. History of pericarditis   3. Essential hypertension   4. Mixed hyperlipidemia   5. Type 2 diabetes mellitus without complication, with long-term current use of insulin ([place])   6. OSA (obstructive sleep apnea)   7. Severe obesity (BMI 35.0-39.9) with comorbidity ([place])        PLAN:  In order of problems listed above:  CHF: Her BNP has never been elevated, but she has had shortness of breath and edema that has improved with diuretic therapy.  No longer certain what her "dry weight" is since she is losing real weight.  Clinically euvolemic today, with only trivial ankle edema.  Potassium level is normal and she is on spironolactone.  We will stop the potassium supplement.  Also stop the isosorbide mononitrate.  She will probably continue to have  some degree of lower extremity edema even when hypovolemic due to longstanding OSA and obesity, peripheral venous insufficiency and treatment with diltiazem.  She is on an SGLT2 inhibitor. History of acute pericarditis: No evidence of effusion or constriction on her follow-up echo. HTN: Somewhat variable at home, very well controlled today.  Notes that we have never really been able to keep her blood pressure well controlled without using a calcium channel blocker. HLP: Mildly elevated triglycerides, otherwise all lipid parameters are good.  Olanzapine may be  contributing to her dyslipidemia. DM: Good glycemic control.  Hemoglobin A1c currently 7.2%, but recently as low as 6.8%. OSA: Monitored by Dr. Elsworth Soho, denies daytime hypersomnolence, reports compliance with CPAP.  Probably contributing to edema via a component of right heart failure. Severe obesity: With the current approximately 10 pound weight loss she is no longer in morbidly obese range.  Congratulated on I hope she can keep up with the successful weight loss.  We will need to constantly reassess her "dry weight".  Medication Adjustments/Labs and Tests Ordered: Current medicines are reviewed at length with the patient today.  Concerns regarding medicines are outlined above.  Medication changes, Labs and Tests ordered today are listed in the Patient Instructions below. Patient Instructions  Medication Instructions:  STOP the Isosorbide (Imdur) STOP the Potassium  *If you need a refill on your cardiac medications before your next appointment, please call your pharmacy*   Lab Work: None ordered If you have labs (blood work) drawn today and your tests are completely normal, you will receive your results only by: [employer] (if you have MyChart) OR A paper copy in the mail If you have any lab test that is abnormal or we need to change your treatment, we will call you to review the results.   Testing/Procedures: None ordered   Follow-Up: At [facility], you and your health needs are our priority.  As part of our continuing mission to provide you with exceptional heart care, we have created designated Provider Care Teams.  These Care Teams include your primary Cardiologist (physician) and Advanced Practice Providers (APPs -  Physician Assistants and Nurse Practitioners) who all work together to provide you with the care you need, when you need it.  We recommend signing up for the patient portal called "MyChart".  Sign up information is provided on this After Visit Summary.  MyChart  is used to connect with patients for Virtual Visits (Telemedicine).  Patients are able to view lab/test results, encounter notes, upcoming appointments, etc.  Non-urgent messages can be sent to your provider as well.   To learn more about what you can do with MyChart, go to [email or web address].    Your next appointment:   Follow up in 6 months with an APP Follow up in 12 months with Dr. Sallyanne Kuster   Signed, Mary Klein, MD  12/27/2020 12:37 PM    [address], [place], [place]  [address] Phone: [phone]; Fax: [phone]

## 2020-12-26 NOTE — Patient Instructions (Signed)
Medication Instructions:  STOP the Isosorbide (Imdur) STOP the Potassium  *If you need a refill on your cardiac medications before your next appointment, please call your pharmacy*   Lab Work: None ordered If you have labs (blood work) drawn today and your tests are completely normal, you will receive your results only by: MyChart Message (if you have MyChart) OR A paper copy in the mail If you have any lab test that is abnormal or we need to change your treatment, we will call you to review the results.   Testing/Procedures: None ordered   Follow-Up: At [facility], you and your health needs are our priority.  As part of our continuing mission to provide you with exceptional heart care, we have created designated Provider Care Teams.  These Care Teams include your primary Cardiologist (physician) and Advanced Practice Providers (APPs -  Physician Assistants and Nurse Practitioners) who all work together to provide you with the care you need, when you need it.  We recommend signing up for the patient portal called "MyChart".  Sign up information is provided on this After Visit Summary.  MyChart is used to connect with patients for Virtual Visits (Telemedicine).  Patients are able to view lab/test results, encounter notes, upcoming appointments, etc.  Non-urgent messages can be sent to your provider as well.   To learn more about what you can do with MyChart, go to [email or web address].    Your next appointment:   Follow up in 6 months with an APP Follow up in 12 months with Dr. Royann Shivers

## 2020-12-27 ENCOUNTER — Encounter: Payer: Self-pay | Admitting: Cardiovascular Disease

## 2021-01-05 DIAGNOSIS — I509 Heart failure, unspecified: Secondary | ICD-10-CM | POA: Diagnosis not present

## 2021-01-05 DIAGNOSIS — Z86010 Personal history of colonic polyps: Secondary | ICD-10-CM | POA: Diagnosis not present

## 2021-01-16 ENCOUNTER — Telehealth: Payer: Self-pay | Admitting: Endocrinology

## 2021-01-16 DIAGNOSIS — E1165 Type 2 diabetes mellitus with hyperglycemia: Secondary | ICD-10-CM | POA: Diagnosis not present

## 2021-01-16 DIAGNOSIS — N1831 Chronic kidney disease, stage 3a: Secondary | ICD-10-CM | POA: Diagnosis not present

## 2021-01-16 DIAGNOSIS — I10 Essential (primary) hypertension: Secondary | ICD-10-CM | POA: Diagnosis not present

## 2021-01-16 DIAGNOSIS — I5032 Chronic diastolic (congestive) heart failure: Secondary | ICD-10-CM | POA: Diagnosis not present

## 2021-01-16 NOTE — Telephone Encounter (Signed)
MEDICATION: liraglutide (VICTOZA) 18 MG/3ML SOPN  PHARMACY:  [facility] [address], [place]  HAS THE PATIENT CONTACTED THEIR PHARMACY?  yes  IS THIS A 90 DAY SUPPLY : 30 days  IS PATIENT OUT OF MEDICATION: 1 day left  IF NOT; HOW MUCH IS LEFT:   LAST APPOINTMENT DATE: @11 /23/2022  NEXT APPOINTMENT DATE:@2 /10/2021  DO WE HAVE YOUR PERMISSION TO LEAVE A DETAILED MESSAGE?:  OTHER COMMENTS: Please note that all [facility] [address], [place], [place] going forward.   **Let patient know to contact pharmacy at the end of the day to make sure medication is ready. **  ** Please notify patient to allow 48-72 hours to process**  **Encourage patient to contact the pharmacy for refills or they can request refills through [facility]**

## 2021-01-20 MED ORDER — VICTOZA 18 MG/3ML ~~LOC~~ SOPN: PEN_INJECTOR | SUBCUTANEOUS | 3 refills | Status: DC

## 2021-01-20 NOTE — Telephone Encounter (Signed)
Script sent  

## 2021-01-26 ENCOUNTER — Telehealth: Payer: Self-pay | Admitting: Cardiovascular Disease

## 2021-01-26 NOTE — Telephone Encounter (Signed)
°*  STAT* If patient is at the pharmacy, call can be transferred to refill team.   1. Which medications need to be refilled? (please list name of each medication and dose if known)  famotidine (PEPCID) 20 MG tablet  2. Which pharmacy/location (including street and city if local pharmacy) is medication to be sent to? [id number] [place], [place] - [address]  3. Do they need a 30 day or 90 day supply? 90 with refills   Patient is out of medication

## 2021-01-27 ENCOUNTER — Other Ambulatory Visit: Payer: Self-pay

## 2021-01-27 DIAGNOSIS — L280 Lichen simplex chronicus: Secondary | ICD-10-CM | POA: Diagnosis not present

## 2021-01-27 DIAGNOSIS — B351 Tinea unguium: Secondary | ICD-10-CM | POA: Diagnosis not present

## 2021-01-27 DIAGNOSIS — L820 Inflamed seborrheic keratosis: Secondary | ICD-10-CM | POA: Diagnosis not present

## 2021-01-27 DIAGNOSIS — L299 Pruritus, unspecified: Secondary | ICD-10-CM | POA: Diagnosis not present

## 2021-01-27 DIAGNOSIS — Z79631 Long term (current) use of antimetabolite agent: Secondary | ICD-10-CM | POA: Diagnosis not present

## 2021-01-27 DIAGNOSIS — Z79899 Other long term (current) drug therapy: Secondary | ICD-10-CM | POA: Diagnosis not present

## 2021-01-27 DIAGNOSIS — Z5181 Encounter for therapeutic drug level monitoring: Secondary | ICD-10-CM | POA: Diagnosis not present

## 2021-01-27 MED ORDER — FAMOTIDINE 20 MG PO TABS: 20.0000 mg | ORAL_TABLET | Freq: Two times a day (BID) | ORAL | 1 refills | Status: AC

## 2021-02-04 DIAGNOSIS — I129 Hypertensive chronic kidney disease with stage 1 through stage 4 chronic kidney disease, or unspecified chronic kidney disease: Secondary | ICD-10-CM | POA: Diagnosis not present

## 2021-02-04 DIAGNOSIS — N182 Chronic kidney disease, stage 2 (mild): Secondary | ICD-10-CM | POA: Diagnosis not present

## 2021-02-04 DIAGNOSIS — E782 Mixed hyperlipidemia: Secondary | ICD-10-CM | POA: Diagnosis not present

## 2021-02-04 DIAGNOSIS — E1165 Type 2 diabetes mellitus with hyperglycemia: Secondary | ICD-10-CM | POA: Diagnosis not present

## 2021-02-04 DIAGNOSIS — Z0000 Encounter for general adult medical examination without abnormal findings: Secondary | ICD-10-CM | POA: Diagnosis not present

## 2021-02-06 DIAGNOSIS — E782 Mixed hyperlipidemia: Secondary | ICD-10-CM | POA: Diagnosis not present

## 2021-02-06 DIAGNOSIS — I130 Hypertensive heart and chronic kidney disease with heart failure and stage 1 through stage 4 chronic kidney disease, or unspecified chronic kidney disease: Secondary | ICD-10-CM | POA: Diagnosis not present

## 2021-02-06 DIAGNOSIS — Z0000 Encounter for general adult medical examination without abnormal findings: Secondary | ICD-10-CM | POA: Diagnosis not present

## 2021-02-06 DIAGNOSIS — I129 Hypertensive chronic kidney disease with stage 1 through stage 4 chronic kidney disease, or unspecified chronic kidney disease: Secondary | ICD-10-CM | POA: Diagnosis not present

## 2021-02-06 DIAGNOSIS — E1165 Type 2 diabetes mellitus with hyperglycemia: Secondary | ICD-10-CM | POA: Diagnosis not present

## 2021-02-06 DIAGNOSIS — Z794 Long term (current) use of insulin: Secondary | ICD-10-CM | POA: Diagnosis not present

## 2021-02-06 DIAGNOSIS — N1831 Chronic kidney disease, stage 3a: Secondary | ICD-10-CM | POA: Diagnosis not present

## 2021-02-06 DIAGNOSIS — I5032 Chronic diastolic (congestive) heart failure: Secondary | ICD-10-CM | POA: Diagnosis not present

## 2021-02-15 ENCOUNTER — Other Ambulatory Visit: Payer: Self-pay | Admitting: Endocrinology

## 2021-02-16 ENCOUNTER — Other Ambulatory Visit: Payer: Self-pay | Admitting: Endocrinology

## 2021-02-16 ENCOUNTER — Telehealth: Payer: Self-pay | Admitting: Endocrinology

## 2021-02-16 DIAGNOSIS — Z794 Long term (current) use of insulin: Secondary | ICD-10-CM

## 2021-02-16 DIAGNOSIS — E1165 Type 2 diabetes mellitus with hyperglycemia: Secondary | ICD-10-CM

## 2021-02-16 MED ORDER — VICTOZA 18 MG/3ML ~~LOC~~ SOPN
PEN_INJECTOR | SUBCUTANEOUS | 3 refills | Status: DC
Start: 2021-02-16 — End: 2021-03-06

## 2021-02-16 NOTE — Telephone Encounter (Signed)
RX now sent to patient's preferred pharmacy 

## 2021-02-16 NOTE — Telephone Encounter (Signed)
Patient asking new prescription on all medication go to the following pharmacy: [facility] [address], [place], [place].   Patient completely out of the below and needs new prescription ASAP.  liraglutide (VICTOZA) 18 MG/3ML SOPN

## 2021-02-24 ENCOUNTER — Other Ambulatory Visit: Payer: Self-pay

## 2021-02-24 DIAGNOSIS — E1165 Type 2 diabetes mellitus with hyperglycemia: Secondary | ICD-10-CM

## 2021-02-24 DIAGNOSIS — Z794 Long term (current) use of insulin: Secondary | ICD-10-CM

## 2021-02-24 MED ORDER — TRESIBA FLEXTOUCH 100 UNIT/ML ~~LOC~~ SOPN: 32.0000 [IU] | PEN_INJECTOR | Freq: Every day | SUBCUTANEOUS | 5 refills | Status: DC

## 2021-02-27 ENCOUNTER — Other Ambulatory Visit (INDEPENDENT_AMBULATORY_CARE_PROVIDER_SITE_OTHER): Payer: Medicare PPO

## 2021-02-27 ENCOUNTER — Other Ambulatory Visit: Payer: Self-pay

## 2021-02-27 DIAGNOSIS — E1165 Type 2 diabetes mellitus with hyperglycemia: Secondary | ICD-10-CM | POA: Diagnosis not present

## 2021-02-27 DIAGNOSIS — Z794 Long term (current) use of insulin: Secondary | ICD-10-CM

## 2021-02-27 LAB — BASIC METABOLIC PANEL
BUN: 19 mg/dL (ref 6–23)
CO2: 35 mEq/L — ABNORMAL HIGH (ref 19–32)
Calcium: 10.1 mg/dL (ref 8.4–10.5)
Chloride: 96 mEq/L (ref 96–112)
Creatinine, Ser: 1.03 mg/dL (ref 0.40–1.20)
GFR: 51.6 mL/min — ABNORMAL LOW (ref >60.00)
Glucose, Bld: 109 mg/dL — ABNORMAL HIGH (ref 70–99)
Potassium: 4.6 mEq/L (ref 3.5–5.1)
Sodium: 139 mEq/L (ref 135–145)

## 2021-02-27 LAB — HEMOGLOBIN A1C: Hgb A1c MFr Bld: 7.5 % — ABNORMAL HIGH (ref 4.6–6.5)

## 2021-03-06 ENCOUNTER — Other Ambulatory Visit: Payer: Self-pay

## 2021-03-06 ENCOUNTER — Encounter: Payer: Self-pay | Admitting: Endocrinology

## 2021-03-06 ENCOUNTER — Ambulatory Visit (INDEPENDENT_AMBULATORY_CARE_PROVIDER_SITE_OTHER): Payer: Medicare PPO | Admitting: Endocrinology

## 2021-03-06 VITALS — BP 132/70 | HR 86 | Ht 63.0 in | Wt 229.2 lb

## 2021-03-06 DIAGNOSIS — Z794 Long term (current) use of insulin: Secondary | ICD-10-CM

## 2021-03-06 DIAGNOSIS — I10 Essential (primary) hypertension: Secondary | ICD-10-CM | POA: Diagnosis not present

## 2021-03-06 DIAGNOSIS — E1165 Type 2 diabetes mellitus with hyperglycemia: Secondary | ICD-10-CM | POA: Diagnosis not present

## 2021-03-06 MED ORDER — TIRZEPATIDE 5 MG/0.5ML ~~LOC~~ SOAJ: 5.0000 mg | SUBCUTANEOUS | 1 refills | Status: DC

## 2021-03-06 NOTE — Progress Notes (Signed)
Patient ID: [name], female   DOB: [birth year], 81 y.o.   MRN: [id number]    Reason for Appointment: Followup for Type 2 Diabetes    History of Present Illness:          Diagnosis: Type 2 diabetes mellitus, date of diagnosis:2011       Past history: Since she was apparently intolerant to metformin she was treated with Onglyza until about 2014  At that time because of increasing A1c of about 8% she was started on Levemir 15 units and this was aggressively increased Onglyza was continued She has not tried any other treatments for diabetes Her A1c has been 10-10.5 in 2015 In 3/15 she was told to start small doses of NovoLog at lunch and supper and add 15 units of Levemir at night also To control  hyperglycemia with her basal bolus insulin regimen she was given Victoza in addition on her initial consultation in 5/15 She has been on Victoza since 8/15  She has been on the V-go pump since 09/2014  Recent history:   INSULIN regimen :  V-go pump 40 unit basal, boluses at meal times: 10-10/12   -12/14 units TRESIBA 32 units daily   Noninsulin hypoglycemic drugs the patient is taking are: Farxiga 10 mg, daily VICTOZA 1.8 mg  Her A1c is slightly higher at 7.5  Current management, blood sugar patterns and problems:   She tends to have high readings fasting consistently despite taking [place] along with her insulin in the pump However not clear if her freestyle Elenor Legato is consistently accurate as her fasting glucose was only 109 when [place] was reading nearly 160  She is generally trying to bolus before her meals consistently about the same amount of 10 to 12 units Has been taking less bolus clicks for breakfast as recommended Not clear why her blood sugars appear to be higher in the early part of the night even though she is not eating any food overnight  She will take 1 or 2 clicks for her bedtime snack and on 10-11 PM which is mostly peanut butter crackers  As before not  able to do any exercise or walking because of dyspnea  Has taken her Victoza consistently No recent change in her weight  She changes her pump consistently around 2 PM Usually taking bolus clicks before starting to eat Overall her diet is still fairly balanced with low fat intake    Meals: 3 meals per day.  Breakfast: Oatmeal or egg or sausage at 8-9 am sometimes with sausage for protein, dinner 5 pm.;   Night snacks will be fruit or peanut butter crackers    Side effects from medications have been: Metformin: rash  Sensor description: Freestyle libre version 2  Analysis of her CGM download for the last 2 weeks is as follows  Her blood sugars are mostly fairly stable throughout the day and night with mildly increased blood sugars throughout the day and night However LAB glucose was only 109 when her Elenor Legato was reading about 160 around the same time OVERNIGHT blood sugars are quite variable with increased readings up to as much as 250 but at times as low as 100 with average consistently high especially around 4 AM.  No hypoglycemia overnight POSTPRANDIAL readings are very well controlled with only mild increase in blood sugars at times after dinner compared to Premeal readings Premeal readings are mostly 150-160 at all meals No hypoglycemia  Results statistics:  CGM use % of time  95  2-week average/GV 160/19  Time in range       77 % was 78  % Time Above 180 23  % Time above 250   % Time Below 70 0     PRE-MEAL Fasting Lunch Dinner Bedtime Overall  Glucose range:       Averages: 150  159  160   POST-MEAL PC Breakfast PC Lunch PC Dinner  Glucose range:     Averages: 152 162 175   Previously:    CGM use % of time   Average and SD   Time in range   78     %  % Time Above 180 21+1  % Time above 250   % Time Below target     PRE-MEAL Fasting Lunch Dinner Bedtime Overall  Glucose range:       Mean/median: 150 161 158 129    POST-MEAL PC Breakfast PC Lunch PC Dinner   Glucose range:     Mean/median: 164 1 166 142    Glycemic control:    Lab Results  Component Value Date   HGBA1C 7.5 (H) 02/27/2021   HGBA1C 7.2 (H) 11/14/2020   HGBA1C 7.8 (H) 07/04/2020   Lab Results  Component Value Date   MICROALBUR <0.7 09/19/2020   LDLCALC 82 12/24/2019   CREATININE 1.03 02/27/2021   Lab Results  Component Value Date   FRUCTOSAMINE 223 09/16/2020   FRUCTOSAMINE 259 10/17/2017   FRUCTOSAMINE 252 08/17/2016     Self-care: The diet that the patient has been following is: tries to limit fats            Dietician visit: Most recent: 2/19   CDE visit: 9/16    Weight history:  Wt Readings from Last 3 Encounters:  03/06/21 229 lb 3.2 oz (104 kg)  12/26/20 223 lb 12.8 oz (101.5 kg)  11/21/20 230 lb 12.8 oz (104.7 kg)    Other active problems: See review of systems    Allergies as of 03/06/2021       Reactions   Flagyl [metronidazole Hcl] Itching, Rash   Ciprofloxacin Itching, Rash   Metformin And Related Rash   Methimazole Rash   Milk-related Compounds Other (See Comments)   Stomach pains   Other Other (See Comments)   [place] nuts cause severe facial redness        Medication List        Accurate as of March 06, 2021 11:59 PM. If you have any questions, ask your nurse or doctor.          STOP taking these medications    Victoza 18 MG/3ML Sopn Generic drug: liraglutide Stopped by: Elayne Snare, MD       TAKE these medications    Accu-Chek Guide test strip Generic drug: glucose blood Use 1-4 times daily as needed/instructed DX E11.9   Accu-Chek Guide w/Device Kit Use 1-4 times daily as needed/directed  DX E11.9   aspirin 81 MG EC tablet Take 1 tablet (81 mg total) by mouth daily.   carbamazepine 200 MG 12 hr capsule Commonly known as: CARBATROL Take 200 mg by mouth 2 (two) times daily.   clobetasol 0.05 % Gel Commonly known as: TEMOVATE Apply 1 application topically 2 (two) times daily as needed (rash).    diltiazem 240 MG 24 hr capsule Commonly known as: CARDIZEM CD Take 1 capsule (240 mg total) by mouth at bedtime.   donepezil 10 MG tablet Commonly known as: ARICEPT Take 10 mg by  mouth at bedtime.   esomeprazole 40 MG capsule Commonly known as: NexIUM Take 1 capsule (40 mg total) by mouth daily at 12 noon.   famotidine 20 MG tablet Commonly known as: PEPCID Take 1 tablet (20 mg total) by mouth 2 (two) times daily.   Farxiga 10 MG Tabs tablet Generic drug: dapagliflozin propanediol TAKE ONE TABLET BY MOUTH DAILY   folic acid 1 MG tablet Commonly known as: FOLVITE Take 1 mg by mouth daily.   FreeStyle Libre 2 Reader Kerrin Mo Use to check blood sugar daily   FreeStyle Libre 2 Sensor Misc Use to check blood sugar. Change every 14 days   furosemide 40 MG tablet Commonly known as: LASIX Take 1 tablet (40 mg total) by mouth 2 (two) times daily.   gabapentin 300 MG capsule Commonly known as: NEURONTIN Take 300 mg by mouth at bedtime.   HYDROcodone-acetaminophen 5-325 MG tablet Commonly known as: NORCO/VICODIN Take 1 tablet by mouth daily as needed.   Insulin Pen Needle 33G X 5 MM Misc Commonly known as: Comfort EZ Pen Needles 1 each by Does not apply route daily. Use with Tresiba pen   Insulin Pen Needle 32G X 5 MM Misc 1 each by Does not apply route daily. Use with Tyler Aas pen   BD Pen Needle Nano U/F 32G X 4 MM Misc Generic drug: Insulin Pen Needle USE 8 PEN NEEDLES PER DAY   levothyroxine 75 MCG tablet Commonly known as: SYNTHROID Take 75 mcg by mouth daily before breakfast.   methotrexate 2.5 MG tablet Commonly known as: RHEUMATREX Take 10 mg by mouth every Monday. For rash   metolazone 2.5 MG tablet Commonly known as: ZAROXOLYN Take one tablet once a week for a weight over 235 pounds. Do not take more than once a week without provider approval.   montelukast 5 MG chewable tablet Commonly known as: SINGULAIR Chew 5 mg by mouth at bedtime.   NovoLOG  FlexPen 100 UNIT/ML FlexPen Generic drug: insulin aspart INJECT 14 TO 16 UNITS BEFORE MEALS AS DIRECTED   NovoLOG 100 UNIT/ML injection Generic drug: insulin aspart USE MAX OF 80 UNITS V GO PER DAY   OLANZapine 7.5 MG tablet Commonly known as: ZYPREXA Take 15 mg by mouth at bedtime.   olmesartan 5 MG tablet Commonly known as: BENICAR Take 5 mg by mouth daily.   ondansetron 4 MG tablet Commonly known as: ZOFRAN Take 4 mg by mouth every 8 (eight) hours as needed for nausea or vomiting.   OneTouch Delica Lancets 62U Misc USE LANCET TO TEST FIVE TIMES A DAY   Accu-Chek Softclix Lancets lancets Use 1-4 times daily as needed/instructed  DX E11.9   pravastatin 20 MG tablet Commonly known as: PRAVACHOL Take 20 mg by mouth daily.   QC Vitamin D3 50 MCG (2000 UT) Tabs Generic drug: Cholecalciferol Take 2,000 Units by mouth daily.   QUEtiapine 25 MG tablet Commonly known as: SEROQUEL Take 100 mg by mouth at bedtime.   spironolactone 25 MG tablet Commonly known as: Aldactone Take 1 tablet (25 mg total) by mouth daily.   tirzepatide 5 MG/0.5ML Pen Commonly known as: MOUNJARO Inject 5 mg into the skin once a week. Started by: Elayne Snare, MD   Tyler Aas FlexTouch 100 UNIT/ML FlexTouch Pen Generic drug: insulin degludec Inject 32 Units into the skin daily. Takes daily in the AM   V-Go 40 Kit USE 1 KIT DAILY AS DIRECTED        Allergies:  Allergies  Allergen Reactions  Flagyl [Metronidazole Hcl] Itching and Rash   Ciprofloxacin Itching and Rash   Metformin And Related Rash   Methimazole Rash   Milk-Related Compounds Other (See Comments)    Stomach pains   Other Other (See Comments)    [place] nuts cause severe facial redness    Past Medical History:  Diagnosis Date   Bipolar disorder ([place])    Depression    Bipolar   GERD (gastroesophageal reflux disease)    History of hiatal hernia    History of kidney stones    History of stomach ulcers 1970s   "bleeding"    Hyperlipidemia    Hypertension    Hypothyroidism    Ischemic colitis, enteritis, or enterocolitis ([place])    OSA on CPAP    Pericarditis 05/06/2017   Pneumonia    "several times" (06/23/2017)   Thyroid disease    TIA (transient ischemic attack) 2011 X 2   Type II diabetes mellitus ([place])    Urinary bladder incontinence    Vertigo     Past Surgical History:  Procedure Laterality Date   APPENDECTOMY     BREAST CYST ASPIRATION Bilateral    CARDIAC CATHETERIZATION N/A 12/24/2014   Procedure: Right/Left Heart Cath and Coronary Angiography;  Surgeon: Sanda Klein, MD;  Location: [facility];  Service: Cardiovascular;  Laterality: N/A;   CATARACT EXTRACTION W/ INTRAOCULAR LENS  IMPLANT, BILATERAL Bilateral    EXCISIONAL HEMORRHOIDECTOMY     EYE SURGERY     FRACTURE SURGERY     IR THORACENTESIS ASP PLEURAL SPACE W/IMG GUIDE  05/13/2017   LAPAROSCOPIC CHOLECYSTECTOMY     LITHOTRIPSY  "several times"   RETINAL DETACHMENT SURGERY     "think it was on the left; not sure" (06/23/2017)   TONSILLECTOMY     VAGINAL HYSTERECTOMY     partial     Family History  Problem Relation Age of Onset   Heart disease Mother    Stroke Father    Parkinson's disease Father    Cancer Sister    Cancer Brother     Social History:  reports that she has never smoked. She has never used smokeless tobacco. She reports that she does not drink alcohol and does not use drugs.    Review of Systems    NEUROPATHY:  Taking gabapentin for painful paresthesia      HYPERLIPIDEMIA: Controlled consistently Taking pravastatin 20 mg daily Labs were done recently in 1/23 by PCP      Lab Results  Component Value Date   CHOL 149 03/26/2017   HDL 56 12/24/2019   LDLCALC 82 12/24/2019   TRIG 130 03/26/2017   CHOLHDL 3.0 03/26/2017       Thyroid:   Has had hypothyroidism for over 20 years, has not had a change in her 75 mcg levothyroxine dose in several years TSH normal as of 1/23 done by PCP:   Lab  Results  Component Value Date   TSH 1.526 08/20/2020   TSH 1.71 07/04/2020   TSH 3.12 10/02/2019   FREET4 1.04 05/05/2017   FREET4 0.86 01/31/2014   FREET4 0.91 08/28/2013       The blood pressure has been managed by PCP with a combination of diltiazem  and benicar 5 mg     BP Readings from Last 3 Encounters:  03/06/21 132/70  12/26/20 113/67  11/21/20 134/74    She was told that she had mild kidney disease but her creatinine has been Quite normal, GFR slightly decreased as  expected for her age No microalbuminuria  Lab Results  Component Value Date   CREATININE 1.03 02/27/2021   CREATININE 1.11 11/14/2020   CREATININE 0.90 09/16/2020      Physical Examination:  BP 132/70    Pulse 86    Ht 5' 3"  (1.6 m)    Wt 229 lb 3.2 oz (104 kg)    SpO2 95%    BMI 40.60 kg/m    ASSESSMENT/PLAN:   Diabetes type 2, with obesity  See history of present illness for detailed discussion of his current management, blood sugar patterns and problems identified  A1c is 7.5   She is on a V-go pump using NovoLog along with [place] as basal insulin 32 units daily and also [place] and Victoza  She is able to monitor her blood sugars better with the freestyle libre although this was reading falsely high at least on the day of her labs Considering her age and comorbid conditions she still has adequate control with 77% blood sugars within her target range in the last 2 weeks Postprandial readings are better controlled also However she has difficulty losing weight Also still requiring relatively large doses of insulin  Recommendations:  Trial of MOUNJARO to see if this would help her blood sugar control, weight loss and reduce insulin requirement Discussed differences between [facility] and Victoza, nature of combination GLP/GIP drugs and ways how this works for improving blood sugar control Discussed possible side effects of nausea/vomiting Patient brochure given and showed her how to do the  injection with the autoinjector  She can finish her Victoza and start taking this on Mondays when she can remember better She will continue 5 mg Mounjaro until her next visit and may consider increasing the dose at that time if tolerated No change in [place] unless morning sugars are going below 100 Also continue same boluses She will call if she has any low sugars  Continue Farxiga and discussed long-term benefits of cardiac and renal protection   Total visit time including counseling = 30 minutes     Patient Instructions  Mounjaro 74m weekly after Victoza out     AElayne Snare2/18/2023, 8:40 PM   Note: This office note was prepared with Dragon voice recognition system technology. Any transcriptional errors that result from this process are unintentional.

## 2021-03-06 NOTE — Patient Instructions (Addendum)
Mounjaro 5mg  weekly after Victoza out

## 2021-03-10 ENCOUNTER — Encounter: Payer: Self-pay | Admitting: Endocrinology

## 2021-03-12 DIAGNOSIS — E1165 Type 2 diabetes mellitus with hyperglycemia: Secondary | ICD-10-CM | POA: Diagnosis not present

## 2021-05-01 ENCOUNTER — Other Ambulatory Visit: Payer: Medicare PPO

## 2021-05-05 ENCOUNTER — Other Ambulatory Visit: Payer: Self-pay | Admitting: Endocrinology

## 2021-05-06 ENCOUNTER — Other Ambulatory Visit: Payer: Self-pay | Admitting: Endocrinology

## 2021-05-06 ENCOUNTER — Other Ambulatory Visit: Payer: Self-pay | Admitting: Cardiovascular Disease

## 2021-05-08 ENCOUNTER — Ambulatory Visit: Payer: Medicare PPO | Admitting: Endocrinology

## 2021-05-22 DIAGNOSIS — N390 Urinary tract infection, site not specified: Secondary | ICD-10-CM | POA: Diagnosis not present

## 2021-05-22 DIAGNOSIS — N3281 Overactive bladder: Secondary | ICD-10-CM | POA: Diagnosis not present

## 2021-05-28 ENCOUNTER — Other Ambulatory Visit (INDEPENDENT_AMBULATORY_CARE_PROVIDER_SITE_OTHER): Payer: Medicare PPO

## 2021-05-28 DIAGNOSIS — E1165 Type 2 diabetes mellitus with hyperglycemia: Secondary | ICD-10-CM

## 2021-05-28 DIAGNOSIS — Z794 Long term (current) use of insulin: Secondary | ICD-10-CM

## 2021-05-28 LAB — BASIC METABOLIC PANEL
BUN: 19 mg/dL (ref 6–23)
CO2: 29 mEq/L (ref 19–32)
Calcium: 9.4 mg/dL (ref 8.4–10.5)
Chloride: 94 mEq/L — ABNORMAL LOW (ref 96–112)
Creatinine, Ser: 1 mg/dL (ref 0.40–1.20)
GFR: 53.37 mL/min — ABNORMAL LOW (ref >60.00)
Glucose, Bld: 217 mg/dL — ABNORMAL HIGH (ref 70–99)
Potassium: 4.3 mEq/L (ref 3.5–5.1)
Sodium: 130 mEq/L — ABNORMAL LOW (ref 135–145)

## 2021-05-29 LAB — FRUCTOSAMINE: Fructosamine: 267 umol/L (ref 0–285)

## 2021-06-01 ENCOUNTER — Ambulatory Visit (INDEPENDENT_AMBULATORY_CARE_PROVIDER_SITE_OTHER): Payer: Medicare PPO | Admitting: Endocrinology

## 2021-06-01 ENCOUNTER — Encounter: Payer: Self-pay | Admitting: Endocrinology

## 2021-06-01 VITALS — BP 128/78 | HR 89 | Ht 63.0 in | Wt 230.0 lb

## 2021-06-01 DIAGNOSIS — Z794 Long term (current) use of insulin: Secondary | ICD-10-CM

## 2021-06-01 DIAGNOSIS — E871 Hypo-osmolality and hyponatremia: Secondary | ICD-10-CM | POA: Diagnosis not present

## 2021-06-01 DIAGNOSIS — E1165 Type 2 diabetes mellitus with hyperglycemia: Secondary | ICD-10-CM

## 2021-06-01 LAB — POCT GLYCOSYLATED HEMOGLOBIN (HGB A1C): Hemoglobin A1C: 7.4 % — AB (ref 4.0–5.6)

## 2021-06-01 MED ORDER — TIRZEPATIDE 7.5 MG/0.5ML ~~LOC~~ SOAJ: 7.5000 mg | SUBCUTANEOUS | 1 refills | Status: DC

## 2021-06-01 MED ORDER — LANTUS SOLOSTAR 100 UNIT/ML ~~LOC~~ SOPN
40.0000 [IU] | PEN_INJECTOR | Freq: Every day | SUBCUTANEOUS | 11 refills | Status: DC
Start: 2021-06-01 — End: 2021-09-28

## 2021-06-01 NOTE — Progress Notes (Signed)
? ? ?Patient ID: [name], female   DOB: 06-20-1940, 81 y.o.   MRN: [id number] ? ? ? ?Reason for Appointment: Followup for Type 2 Diabetes   ? ?History of Present Illness:  ?        ?Diagnosis: Type 2 diabetes mellitus, date of diagnosis:2011      ? ?Past history: Since she was apparently intolerant to metformin she was treated with Onglyza until about 2014  ?At that time because of increasing A1c of about 8% she was started on Levemir 15 units and this was aggressively increased ?Onglyza was continued ?She has not tried any other treatments for diabetes ?Her A1c has been 10-10.5 in 2015 ?In 3/15 she was told to start small doses of NovoLog at lunch and supper and add 15 units of Levemir at night also ?To control  hyperglycemia with her basal bolus insulin regimen she was given Victoza in addition on her initial consultation in 5/15 ?She has been on Victoza since 8/15 ? ?She has been on the V-go pump since 09/2014 ? ?Recent history:  ? ?INSULIN regimen :  V-go pump 40 unit basal, boluses at meal times: 10-10/12   -12/14 units ?Novolg 14 at meals ?TRESIBA 32 units daily ? ? Noninsulin hypoglycemic drugs the patient is taking are: Farxiga 10 mg, daily, Mounjaro 5 mg weekly ? ?Her A1c is still higher at 7.4 compared to 7.5 ? ?Current management, blood sugar patterns and problems: ? ? ?She does not appear to be having better results with Mounjaro compared to Victoza  ?Although she thinks she was starting to get some low sugars initially when she switched to [facility] now her blood sugars recently appear to be higher than before  ?She has not changed her diet ?She does not think she has any greater satiety with Mounjaro compared to Victoza  ? ?No recent change in her weight  ?She changes her pump consistently around 2 PM ?Usually taking bolus clicks AFTER starting to eat and not clear why she is doing this ?Although she is supposed to use NovoLog only as a backup for inadequate boluses she now taking 14 units  with every meal regularly after eating ?Overall her diet is still controlled with low fat intake ?She also has cut down on her bedtime snack which may be sometimes causing her sugars to be low normal around midnight ?Again not able to explain why she has blood sugar spikes during the night even though she thinks she is not eating any snacks ?She says she is walking about 3 to 4 days a week about 15 minutes ? ?  ?Meals: 3 meals per day.  Breakfast: Oatmeal or egg or sausage at 8-9 am sometimes with sausage for protein, dinner 5 pm.;   ?Night snacks will be fruit or peanut butter crackers   ? ?Side effects from medications have been: Metformin: rash ? ?Wt Readings from Last 3 Encounters:  ?06/01/21 230 lb (104.3 kg)  ?03/06/21 229 lb 3.2 oz (104 kg)  ?12/26/20 223 lb 12.8 oz (101.5 kg)  ? ? ? ?Sensor description: [employer] version 2 ? ?Analysis of her CGM download for the last 2 weeks is as follows ? ?Her blood sugars are mostly averaging close to 180 throughout the day and night ?HIGHEST blood sugars on an average are around 3 AM and lowest around 12 AM  ?Moderate variability is present especially overnight  ?OVERNIGHT blood sugars periodically will tend to rise after midnight for different lengths of time and  tend to be the highest between 2-5 AM ?Lowest blood sugars overnight are close to midnight including once below 70  ?POSTPRANDIAL readings on an average do not increase significantly with only occasional random spikes after breakfast or dinner ?Postprandial readings on an average after dinner are lower after 8 PM but level compared to Premeal readings at breakfast and lunch ?Only rare hypoglycemia as discussed overnight above ? ?Results statistics: ? ?CGM use % of time   ?2-week average/GV 176  ?Time in range      58  %  ?% Time Above 180 38+4  ?% Time above 250   ?% Time Below 70 0  ? ?  ?PRE-MEAL Fasting Lunch Dinner Bedtime Overall  ?Glucose range:       ?Averages: 158   153 176  ? ?POST-MEAL PC  Breakfast PC Lunch PC Dinner  ?Glucose range:     ?Averages: 185 188 197  ? ? ? ? ?CGM use % of time 95  ?2-week average/GV 160/19  ?Time in range       77 % was 78  ?% Time Above 180 23  ?% Time above 250   ?% Time Below 70 0  ? ?  ?PRE-MEAL Fasting Lunch Dinner Bedtime Overall  ?Glucose range:       ?Averages: 150  159  160  ? ?POST-MEAL PC Breakfast PC Lunch PC Dinner  ?Glucose range:     ?Averages: 152 162 175  ? ? ? ?Glycemic control:  ? ? ?Lab Results  ?Component Value Date  ? HGBA1C 7.4 (A) 06/01/2021  ? HGBA1C 7.5 (H) 02/27/2021  ? HGBA1C 7.2 (H) 11/14/2020  ? ?Lab Results  ?Component Value Date  ? MICROALBUR <0.7 09/19/2020  ? [place] 82 12/24/2019  ? CREATININE 1.00 05/28/2021  ? ?Lab Results  ?Component Value Date  ? FRUCTOSAMINE 267 05/28/2021  ? FRUCTOSAMINE 223 09/16/2020  ? FRUCTOSAMINE 259 10/17/2017  ? ? ? ?Self-care: The diet that the patient has been following is: tries to limit fats  ?        ?  ?Dietician visit: Most recent: 2/19 ?  ?CDE visit: 9/16  ? ? ?Weight history: ? ?Wt Readings from Last 3 Encounters:  ?06/01/21 230 lb (104.3 kg)  ?03/06/21 229 lb 3.2 oz (104 kg)  ?12/26/20 223 lb 12.8 oz (101.5 kg)  ? ? ?Other active problems: See review of systems ? ? ? ?Allergies as of 06/01/2021   ? ?   Reactions  ? Flagyl [metronidazole Hcl] Itching, Rash  ? Ciprofloxacin Itching, Rash  ? Metformin And Related Rash  ? Methimazole Rash  ? Milk-related Compounds Other (See Comments)  ? Stomach pains  ? Other Other (See Comments)  ? [place] nuts cause severe facial redness  ? ?  ? ?  ?Medication List  ?  ? ?  ? Accurate as of Jun 01, 2021  4:10 PM. If you have any questions, ask your nurse or doctor.  ?  ?  ? ?  ? ?STOP taking these medications   ? ?Mounjaro 5 MG/0.5ML Pen ?Generic drug: tirzepatide ?Replaced by: tirzepatide 7.5 MG/0.5ML Pen ?Stopped by: Elayne Snare, MD ?  ?Tyler Aas FlexTouch 100 UNIT/ML FlexTouch Pen ?Generic drug: insulin degludec ?Stopped by: Elayne Snare, MD ?  ? ?  ? ?TAKE these  medications   ? ?Accu-Chek Guide test strip ?Generic drug: glucose blood ?Use 1-4 times daily as needed/instructed DX E11.9 ?  ?Accu-Chek Guide w/Device Kit ?Use 1-4 times daily as needed/directed  DX E11.9 ?  ?aspirin 81 MG EC tablet ?Take 1 tablet (81 mg total) by mouth daily. ?  ?carbamazepine 200 MG 12 hr capsule ?Commonly known as: CARBATROL ?Take 200 mg by mouth 2 (two) times daily. ?  ?clobetasol 0.05 % Gel ?Commonly known as: TEMOVATE ?Apply 1 application topically 2 (two) times daily as needed (rash). ?  ?diltiazem 240 MG 24 hr capsule ?Commonly known as: CARDIZEM CD ?TAKE 1 CAPSULE BY MOUTH AT NIGHT AT BEDTIME ?  ?donepezil 10 MG tablet ?Commonly known as: ARICEPT ?Take 10 mg by mouth at bedtime. ?  ?esomeprazole 40 MG capsule ?Commonly known as: NexIUM ?Take 1 capsule (40 mg total) by mouth daily at 12 noon. ?  ?famotidine 20 MG tablet ?Commonly known as: PEPCID ?Take 1 tablet (20 mg total) by mouth 2 (two) times daily. ?  ?Farxiga 10 MG Tabs tablet ?Generic drug: dapagliflozin propanediol ?TAKE ONE TABLET BY MOUTH DAILY ?  ?folic acid 1 MG tablet ?Commonly known as: FOLVITE ?Take 1 mg by mouth daily. ?  ?FreeStyle [place] 2 Reader Kerrin Mo ?Use to check blood sugar daily ?  ?FreeStyle Libre 2 Sensor Misc ?Use to check blood sugar. Change every 14 days ?  ?furosemide 40 MG tablet ?Commonly known as: LASIX ?Take 1 tablet (40 mg total) by mouth 2 (two) times daily. ?  ?gabapentin 300 MG capsule ?Commonly known as: NEURONTIN ?Take 300 mg by mouth at bedtime. ?  ?HYDROcodone-acetaminophen 5-325 MG tablet ?Commonly known as: NORCO/VICODIN ?Take 1 tablet by mouth daily as needed. ?  ?Insulin Pen Needle 33G X 5 MM Misc ?Commonly known as: Comfort EZ Pen Needles ?1 each by Does not apply route daily. Use with [place] pen ?  ?Insulin Pen Needle 32G X 5 MM Misc ?1 each by Does not apply route daily. Use with [place] pen ?  ?BD Pen Needle Nano U/F 32G X 4 MM Misc ?Generic drug: Insulin Pen Needle ?USE 8 PEN NEEDLES PER  DAY ?  ?Lantus SoloStar 100 UNIT/ML Solostar Pen ?Generic drug: insulin glargine ?Inject 40 Units into the skin at bedtime. ?Started by: Elayne Snare, MD ?  ?levothyroxine 75 MCG tablet ?Commonly known as: SYNTHROID

## 2021-06-01 NOTE — Patient Instructions (Addendum)
Tresiba 36 units daily, Lantus same dose when [place] out ? ?LANTUS at bedtime  ? ?Call when 7.5mg  Mounjaro out ? ?Clicks at start of meals ? ?Supper 8 clicks and no shot ?

## 2021-06-09 ENCOUNTER — Encounter: Payer: Self-pay | Admitting: Endocrinology

## 2021-06-22 DIAGNOSIS — N390 Urinary tract infection, site not specified: Secondary | ICD-10-CM | POA: Diagnosis not present

## 2021-06-26 ENCOUNTER — Telehealth: Payer: Self-pay

## 2021-06-26 NOTE — Telephone Encounter (Signed)
Patient called in states that since last office visit (06/01/21) when she takes lantus her blood sugar is dropping an hour after taking. 70-80 range. Feels it is too much. She is taking 36 units at night. Please advise.

## 2021-06-30 ENCOUNTER — Other Ambulatory Visit: Payer: Self-pay | Admitting: Endocrinology

## 2021-07-01 ENCOUNTER — Telehealth: Payer: Self-pay

## 2021-07-01 NOTE — Telephone Encounter (Signed)
Error

## 2021-07-02 ENCOUNTER — Other Ambulatory Visit: Payer: Self-pay | Admitting: Endocrinology

## 2021-07-02 DIAGNOSIS — N3946 Mixed incontinence: Secondary | ICD-10-CM | POA: Diagnosis not present

## 2021-07-17 DIAGNOSIS — N3946 Mixed incontinence: Secondary | ICD-10-CM | POA: Diagnosis not present

## 2021-07-24 ENCOUNTER — Telehealth: Payer: Self-pay

## 2021-07-24 NOTE — Telephone Encounter (Signed)
Patient called in states needs a PA done for [facility] please start PA for patient.

## 2021-07-24 NOTE — Telephone Encounter (Signed)
Patient called in will probably miss her dose that is due on Monday. Wants to know what you recommend?

## 2021-07-28 ENCOUNTER — Other Ambulatory Visit (HOSPITAL_COMMUNITY): Payer: Self-pay

## 2021-07-28 ENCOUNTER — Other Ambulatory Visit: Payer: Self-pay

## 2021-07-28 DIAGNOSIS — E1165 Type 2 diabetes mellitus with hyperglycemia: Secondary | ICD-10-CM

## 2021-07-28 MED ORDER — TIRZEPATIDE 7.5 MG/0.5ML ~~LOC~~ SOAJ: 7.5000 mg | SUBCUTANEOUS | 0 refills | Status: DC

## 2021-07-29 ENCOUNTER — Other Ambulatory Visit: Payer: Self-pay | Admitting: Endocrinology

## 2021-07-29 NOTE — Telephone Encounter (Signed)
Spoke with [place] from PA team and she states that PA is not needed. I called patient spoke with Son and let him know.

## 2021-07-31 DIAGNOSIS — Z794 Long term (current) use of insulin: Secondary | ICD-10-CM | POA: Diagnosis not present

## 2021-07-31 DIAGNOSIS — I130 Hypertensive heart and chronic kidney disease with heart failure and stage 1 through stage 4 chronic kidney disease, or unspecified chronic kidney disease: Secondary | ICD-10-CM | POA: Diagnosis not present

## 2021-07-31 DIAGNOSIS — Z0000 Encounter for general adult medical examination without abnormal findings: Secondary | ICD-10-CM | POA: Diagnosis not present

## 2021-07-31 DIAGNOSIS — E039 Hypothyroidism, unspecified: Secondary | ICD-10-CM | POA: Diagnosis not present

## 2021-07-31 DIAGNOSIS — E1165 Type 2 diabetes mellitus with hyperglycemia: Secondary | ICD-10-CM | POA: Diagnosis not present

## 2021-07-31 DIAGNOSIS — E782 Mixed hyperlipidemia: Secondary | ICD-10-CM | POA: Diagnosis not present

## 2021-07-31 DIAGNOSIS — I129 Hypertensive chronic kidney disease with stage 1 through stage 4 chronic kidney disease, or unspecified chronic kidney disease: Secondary | ICD-10-CM | POA: Diagnosis not present

## 2021-07-31 DIAGNOSIS — I5032 Chronic diastolic (congestive) heart failure: Secondary | ICD-10-CM | POA: Diagnosis not present

## 2021-07-31 DIAGNOSIS — N1831 Chronic kidney disease, stage 3a: Secondary | ICD-10-CM | POA: Diagnosis not present

## 2021-08-07 DIAGNOSIS — I130 Hypertensive heart and chronic kidney disease with heart failure and stage 1 through stage 4 chronic kidney disease, or unspecified chronic kidney disease: Secondary | ICD-10-CM | POA: Diagnosis not present

## 2021-08-07 DIAGNOSIS — E782 Mixed hyperlipidemia: Secondary | ICD-10-CM | POA: Diagnosis not present

## 2021-08-07 DIAGNOSIS — E1165 Type 2 diabetes mellitus with hyperglycemia: Secondary | ICD-10-CM | POA: Diagnosis not present

## 2021-08-07 DIAGNOSIS — N1831 Chronic kidney disease, stage 3a: Secondary | ICD-10-CM | POA: Diagnosis not present

## 2021-08-07 DIAGNOSIS — I5032 Chronic diastolic (congestive) heart failure: Secondary | ICD-10-CM | POA: Diagnosis not present

## 2021-08-07 DIAGNOSIS — E039 Hypothyroidism, unspecified: Secondary | ICD-10-CM | POA: Diagnosis not present

## 2021-08-07 DIAGNOSIS — Z794 Long term (current) use of insulin: Secondary | ICD-10-CM | POA: Diagnosis not present

## 2021-08-07 DIAGNOSIS — I129 Hypertensive chronic kidney disease with stage 1 through stage 4 chronic kidney disease, or unspecified chronic kidney disease: Secondary | ICD-10-CM | POA: Diagnosis not present

## 2021-08-19 ENCOUNTER — Other Ambulatory Visit: Payer: Self-pay | Admitting: Cardiovascular Disease

## 2021-08-19 NOTE — Telephone Encounter (Signed)
Rx(s) sent to pharmacy electronically.  

## 2021-08-20 DIAGNOSIS — E1165 Type 2 diabetes mellitus with hyperglycemia: Secondary | ICD-10-CM | POA: Diagnosis not present

## 2021-08-24 ENCOUNTER — Other Ambulatory Visit: Payer: Self-pay

## 2021-08-24 DIAGNOSIS — E1165 Type 2 diabetes mellitus with hyperglycemia: Secondary | ICD-10-CM

## 2021-08-24 MED ORDER — TIRZEPATIDE 7.5 MG/0.5ML ~~LOC~~ SOAJ: 7.5000 mg | SUBCUTANEOUS | 0 refills | Status: DC

## 2021-08-27 ENCOUNTER — Other Ambulatory Visit (INDEPENDENT_AMBULATORY_CARE_PROVIDER_SITE_OTHER): Payer: Medicare PPO

## 2021-08-27 DIAGNOSIS — Z794 Long term (current) use of insulin: Secondary | ICD-10-CM | POA: Diagnosis not present

## 2021-08-27 DIAGNOSIS — E1165 Type 2 diabetes mellitus with hyperglycemia: Secondary | ICD-10-CM

## 2021-08-27 LAB — HEMOGLOBIN A1C: Hgb A1c MFr Bld: 7.1 % — ABNORMAL HIGH (ref 4.6–6.5)

## 2021-08-27 LAB — BASIC METABOLIC PANEL
BUN: 18 mg/dL (ref 6–23)
CO2: 30 mEq/L (ref 19–32)
Calcium: 9.6 mg/dL (ref 8.4–10.5)
Chloride: 94 mEq/L — ABNORMAL LOW (ref 96–112)
Creatinine, Ser: 1.13 mg/dL (ref 0.40–1.20)
GFR: 46.01 mL/min — ABNORMAL LOW (ref >60.00)
Glucose, Bld: 91 mg/dL (ref 70–99)
Potassium: 4.1 mEq/L (ref 3.5–5.1)
Sodium: 132 mEq/L — ABNORMAL LOW (ref 135–145)

## 2021-08-28 ENCOUNTER — Telehealth: Payer: Self-pay

## 2021-08-28 NOTE — Telephone Encounter (Signed)
Patient called in to request labs to be sent to [facility]. [name]. Sent

## 2021-09-03 ENCOUNTER — Encounter: Payer: Self-pay | Admitting: Endocrinology

## 2021-09-03 ENCOUNTER — Ambulatory Visit (INDEPENDENT_AMBULATORY_CARE_PROVIDER_SITE_OTHER): Payer: Medicare PPO | Admitting: Endocrinology

## 2021-09-03 VITALS — BP 142/62 | HR 93 | Ht 63.0 in | Wt 225.0 lb

## 2021-09-03 DIAGNOSIS — E871 Hypo-osmolality and hyponatremia: Secondary | ICD-10-CM | POA: Diagnosis not present

## 2021-09-03 DIAGNOSIS — I10 Essential (primary) hypertension: Secondary | ICD-10-CM | POA: Diagnosis not present

## 2021-09-03 DIAGNOSIS — E1165 Type 2 diabetes mellitus with hyperglycemia: Secondary | ICD-10-CM

## 2021-09-03 DIAGNOSIS — Z794 Long term (current) use of insulin: Secondary | ICD-10-CM | POA: Diagnosis not present

## 2021-09-03 NOTE — Patient Instructions (Addendum)
Lantus 20 units at bedtime  Check blood sugars on waking up days a week  Also check blood sugars about 2 hours after meals and do this after different meals by rotation  Recommended blood sugar levels on waking up are 100-130 and about 2 hours after meal is 130-180  Please bring your blood sugar monitor to each visit, thank you

## 2021-09-03 NOTE — Progress Notes (Signed)
Patient ID: [name], female   DOB: 1940-12-11, 81 y.o.   MRN: [id number]    Reason for Appointment: Followup for Type 2 Diabetes    History of Present Illness:          Diagnosis: Type 2 diabetes mellitus, date of diagnosis:2011       Past history: Since she was apparently intolerant to metformin she was treated with Onglyza until about 2014  At that time because of increasing A1c of about 8% she was started on Levemir 15 units and this was aggressively increased Onglyza was continued She has not tried any other treatments for diabetes Her A1c has been 10-10.5 in 2015 In 3/15 she was told to start small doses of NovoLog at lunch and supper and add 15 units of Levemir at night also To control  hyperglycemia with her basal bolus insulin regimen she was given Victoza in addition on her initial consultation in 5/15 She has been on Victoza since 8/15  She has been on the V-go pump since 09/2014  Recent history:   INSULIN regimen :  V-go pump 40 unit basal, boluses at meal times: 10 units breakfast-10/12 at lunch  Lantus 30 units daily in a.m.   Noninsulin hypoglycemic drugs the patient is taking are: Farxiga 10 mg, daily, Mounjaro 7.5 mg weekly  Her A1c is improving at 7.1 compared to 7.4 She thinks her A1c with her PCP was 6.5   Current management, blood sugar patterns and problems:  She finally appears to be getting better blood sugar control with going up to 7.5 mg Mounjaro No issues with insurance coverage now that she was able to get PA for  Since her sugars were higher mostly overnight previously she was changed from [place] to Lantus but she is taking this in the morning instead of at night as directed  With this she thinks her blood sugars are getting low around dinnertime in the evening  For this reason she has not taken any boluses at dinnertime Surprisingly her blood sugars do not go higher after her evening meal or snacks Although she has some  inconsistent control with tendency to high sugars even overnight her overall blood sugars at home are now averaging only 140 compared to 176  She still has some tendency to hypoglycemia in the afternoon between lunch and supper time She says she has verified her Elenor Legato reading with a fingerstick  Also unclear why she still has relatively high readings after breakfast at times despite reporting taking the bolus before eating, unlikely that she is doing this consistently as sometimes blood sugars may be low normal before eating Weight is down 5 pounds now Overall insulin requirement has gone down including basal    Meals: 3 meals per day.  Breakfast: Oatmeal or egg or sausage at 8-9 am sometimes with sausage for protein, dinner 5 pm.;   Night snacks will be fruit or peanut butter crackers    Side effects from medications have been: Metformin: rash  Wt Readings from Last 3 Encounters:  09/03/21 225 lb (102.1 kg)  06/01/21 230 lb (104.3 kg)  03/06/21 229 lb 3.2 oz (104 kg)     Sensor description: Freestyle libre version 2  Analysis of her CGM download for the last 2 weeks is as follows  Her blood sugars are overall fairly steady throughout the day and night with average reading at the lowest 126 at midnight and highest 155 after breakfast for the 2 hourly analysis  Compared to her last visit her time in range is much better at 90 compared to 58%   HIGHEST blood sugars on an average are after breakfast compared to 3 AM previously She also has less variability Postprandial readings periodically higher after breakfast or lunch but generally not after dinner Blood sugars will tend to be low normal or low before dinner although hypoglycemia is documented only once  Results statistics:  CGM use % of time 94  2-week average/GV 141/21  Time in range      90%  % Time Above 180 10  % Time above 250   % Time Below 70 0     PRE-MEAL Fasting Lunch Dinner Bedtime Overall  Glucose range:        Averages: 137       POST-MEAL PC Breakfast PC Lunch PC Dinner  Glucose range:     Averages: 155 148 145   PREVIOUSLY  CGM use % of time   2-week average/GV 176  Time in range      58  %  % Time Above 180 38+4  % Time above 250   % Time Below 70 0     PRE-MEAL Fasting Lunch Dinner Bedtime Overall  Glucose range:       Averages: 158   153 176   POST-MEAL PC Breakfast PC Lunch PC Dinner  Glucose range:     Averages: 185 188 197    Glycemic control:    Lab Results  Component Value Date   HGBA1C 7.1 (H) 08/27/2021   HGBA1C 7.4 (A) 06/01/2021   HGBA1C 7.5 (H) 02/27/2021   Lab Results  Component Value Date   MICROALBUR <0.7 09/19/2020   LDLCALC 82 12/24/2019   CREATININE 1.13 08/27/2021   Lab Results  Component Value Date   FRUCTOSAMINE 267 05/28/2021   FRUCTOSAMINE 223 09/16/2020   FRUCTOSAMINE 259 10/17/2017     Self-care: The diet that the patient has been following is: tries to limit fats            Dietician visit: Most recent: 2/19   CDE visit: 9/16    Weight history:  Wt Readings from Last 3 Encounters:  09/03/21 225 lb (102.1 kg)  06/01/21 230 lb (104.3 kg)  03/06/21 229 lb 3.2 oz (104 kg)    Other active problems: See review of systems    Allergies as of 09/03/2021       Reactions   Flagyl [metronidazole Hcl] Itching, Rash   Ciprofloxacin Itching, Rash   Metformin And Related Rash   Methimazole Rash   Milk-related Compounds Other (See Comments)   Stomach pains   Other Other (See Comments)   [place] nuts cause severe facial redness        Medication List        Accurate as of September 03, 2021 11:59 PM. If you have any questions, ask your nurse or doctor.          Accu-Chek Guide test strip Generic drug: glucose blood Use 1-4 times daily as needed/instructed DX E11.9   Accu-Chek Guide w/Device Kit Use 1-4 times daily as needed/directed  DX E11.9   aspirin EC 81 MG tablet Take 1 tablet (81 mg total) by mouth daily.    carbamazepine 200 MG 12 hr capsule Commonly known as: CARBATROL Take 200 mg by mouth 2 (two) times daily.   clobetasol 0.05 % Gel Commonly known as: TEMOVATE Apply 1 application topically 2 (two) times daily as needed (rash).  diltiazem 240 MG 24 hr capsule Commonly known as: CARDIZEM CD TAKE 1 CAPSULE BY MOUTH AT NIGHT AT BEDTIME   donepezil 10 MG tablet Commonly known as: ARICEPT Take 10 mg by mouth at bedtime.   esomeprazole 40 MG capsule Commonly known as: NexIUM Take 1 capsule (40 mg total) by mouth daily at 12 noon.   famotidine 20 MG tablet Commonly known as: PEPCID Take 1 tablet (20 mg total) by mouth 2 (two) times daily.   Farxiga 10 MG Tabs tablet Generic drug: dapagliflozin propanediol TAKE 1 TABLET BY MOUTH EVERY DAY   folic acid 1 MG tablet Commonly known as: FOLVITE Take 1 mg by mouth daily.   FreeStyle Libre 2 Reader Kerrin Mo Use to check blood sugar daily   FreeStyle Libre 2 Sensor Misc Use to check blood sugar. Change every 14 days   furosemide 40 MG tablet Commonly known as: LASIX TAKE 1 TABLET BY MOUTH EVERY DAY   gabapentin 300 MG capsule Commonly known as: NEURONTIN Take 300 mg by mouth at bedtime.   HYDROcodone-acetaminophen 5-325 MG tablet Commonly known as: NORCO/VICODIN Take 1 tablet by mouth daily as needed.   Insulin Pen Needle 33G X 5 MM Misc Commonly known as: Comfort EZ Pen Needles 1 each by Does not apply route daily. Use with Tresiba pen   Insulin Pen Needle 32G X 5 MM Misc 1 each by Does not apply route daily. Use with Tyler Aas pen   BD Pen Needle Nano U/F 32G X 4 MM Misc Generic drug: Insulin Pen Needle USE 8 PEN NEEDLES PER DAY   Lantus SoloStar 100 UNIT/ML Solostar Pen Generic drug: insulin glargine Inject 40 Units into the skin at bedtime.   levothyroxine 75 MCG tablet Commonly known as: SYNTHROID Take 75 mcg by mouth daily before breakfast.   methotrexate 2.5 MG tablet Commonly known as: RHEUMATREX Take 10 mg  by mouth every Monday. For rash   montelukast 5 MG chewable tablet Commonly known as: SINGULAIR Chew 5 mg by mouth at bedtime.   NovoLOG FlexPen 100 UNIT/ML FlexPen Generic drug: insulin aspart INJECT 14 TO 16 UNITS BEFORE MEALS AS DIRECTED   NovoLOG 100 UNIT/ML injection Generic drug: insulin aspart USE MAX OF 80 UNITS PER DAY   OLANZapine 7.5 MG tablet Commonly known as: ZYPREXA Take 15 mg by mouth at bedtime.   olmesartan 5 MG tablet Commonly known as: BENICAR Take 5 mg by mouth daily.   ondansetron 4 MG tablet Commonly known as: ZOFRAN Take 4 mg by mouth every 8 (eight) hours as needed for nausea or vomiting.   OneTouch Delica Lancets 46E Misc USE LANCET TO TEST FIVE TIMES A DAY   Accu-Chek Softclix Lancets lancets Use 1-4 times daily as needed/instructed  DX E11.9   pravastatin 20 MG tablet Commonly known as: PRAVACHOL Take 20 mg by mouth daily.   QC Vitamin D3 50 MCG (2000 UT) Tabs Generic drug: Cholecalciferol Take 2,000 Units by mouth daily.   QUEtiapine 25 MG tablet Commonly known as: SEROQUEL Take 100 mg by mouth at bedtime.   spironolactone 25 MG tablet Commonly known as: Aldactone Take 1 tablet (25 mg total) by mouth daily.   tirzepatide 7.5 MG/0.5ML Pen Commonly known as: MOUNJARO Inject 7.5 mg into the skin once a week.   V-Go 40 Kit USE 1 KIT DAILY AS DIRECTED   V-Go 40 40 UNIT/24HR Kit USE ONE KIT AS DIRECTED        Allergies:  Allergies  Allergen Reactions  Flagyl [Metronidazole Hcl] Itching and Rash   Ciprofloxacin Itching and Rash   Metformin And Related Rash   Methimazole Rash   Milk-Related Compounds Other (See Comments)    Stomach pains   Other Other (See Comments)    [place] nuts cause severe facial redness    Past Medical History:  Diagnosis Date   Bipolar disorder ([place])    Depression    Bipolar   GERD (gastroesophageal reflux disease)    History of hiatal hernia    History of kidney stones    History of  stomach ulcers 1970s   "bleeding"   Hyperlipidemia    Hypertension    Hypothyroidism    Ischemic colitis, enteritis, or enterocolitis ([place])    OSA on CPAP    Pericarditis 05/06/2017   Pneumonia    "several times" (06/23/2017)   Thyroid disease    TIA (transient ischemic attack) 2011 X 2   Type II diabetes mellitus ([place])    Urinary bladder incontinence    Vertigo     Past Surgical History:  Procedure Laterality Date   APPENDECTOMY     BREAST CYST ASPIRATION Bilateral    CARDIAC CATHETERIZATION N/A 12/24/2014   Procedure: Right/Left Heart Cath and Coronary Angiography;  Surgeon: Sanda Klein, MD;  Location: [facility];  Service: Cardiovascular;  Laterality: N/A;   CATARACT EXTRACTION W/ INTRAOCULAR LENS  IMPLANT, BILATERAL Bilateral    EXCISIONAL HEMORRHOIDECTOMY     EYE SURGERY     FRACTURE SURGERY     IR THORACENTESIS ASP PLEURAL SPACE W/IMG GUIDE  05/13/2017   LAPAROSCOPIC CHOLECYSTECTOMY     LITHOTRIPSY  "several times"   RETINAL DETACHMENT SURGERY     "think it was on the left; not sure" (06/23/2017)   TONSILLECTOMY     VAGINAL HYSTERECTOMY     partial     Family History  Problem Relation Age of Onset   Heart disease Mother    Stroke Father    Parkinson's disease Father    Cancer Sister    Cancer Brother     Social History:  reports that she has never smoked. She has never used smokeless tobacco. She reports that she does not drink alcohol and does not use drugs.    Review of Systems    NEUROPATHY:  Taking gabapentin for painful paresthesia      HYPERLIPIDEMIA: Controlled consistently Taking pravastatin 20 mg daily Labs were done recently in 1/23 by PCP      Lab Results  Component Value Date   CHOL 149 03/26/2017   HDL 56 12/24/2019   LDLCALC 82 12/24/2019   TRIG 130 03/26/2017   CHOLHDL 3.0 03/26/2017       Thyroid:   Has had hypothyroidism for over 20 years, has not had a change in her 75 mcg levothyroxine dose in several years TSH normal  as of 1/23 done by PCP:   Lab Results  Component Value Date   TSH 1.526 08/20/2020   TSH 1.71 07/04/2020   TSH 3.12 10/02/2019   FREET4 1.04 05/05/2017   FREET4 0.86 01/31/2014   FREET4 0.91 08/28/2013       The blood pressure has been managed by PCP with a combination of diltiazem  and benicar 5 mg     BP Readings from Last 3 Encounters:  09/03/21 (!) 142/62  06/01/21 128/78  03/06/21 132/70    She was told that she had mild kidney disease but her creatinine has been Quite normal, GFR slightly decreased  as expected for her age No microalbuminuria  Lab Results  Component Value Date   CREATININE 1.13 08/27/2021   CREATININE 1.00 05/28/2021   CREATININE 1.03 02/27/2021      Physical Examination:  BP (!) 142/62   Pulse 93   Ht 5' 3"  (1.6 m)   Wt 225 lb (102.1 kg)   SpO2 96%   BMI 39.86 kg/m   2+ pedal edema present  ASSESSMENT/PLAN:   Diabetes type 2, with obesity  See history of present illness for detailed discussion of his current management, blood sugar patterns and problems identified  A1c is in progress 7.1   She is on a V-go pump using NovoLog along with LANTUS as basal insulin 30 units daily and also [place] and Mounjaro 5 mg  She has had reduced blood sugars and reduced insulin requirement with going up to 7.5 mg Mounjaro Also likely has more control of her blood sugar overnight also Appears to be more insulin sensitive and blood sugars are surprisingly low normal at dinnertime and she is not needing any boluses in the evening This may be from a peak effect of her Lantus that she takes in the morning Weight has finally come down  Recommendations:  Will change her Lantus to after dinner instead of morning This may improve her blood sugars overnight slightly and avoid low sugars in the late afternoon Also at the same time she can try taking only 20 units No change in her pump basal rate of 40 and to change this consistently at the same  time Continue adjusting boluses at meals based on meal size Reminded to bolus a few minutes before starting to eat her meal regardless of blood sugars  Reminded her to to avoid overtreating low sugars   HYPONATREMIA: Likely to be from fluid overload and history of CHF and sodium is relatively stable at 132  Total visit time for evaluation and management including counseling = 30 minutes     Patient Instructions  Lantus 20 units at bedtime  Check blood sugars on waking up days a week  Also check blood sugars about 2 hours after meals and do this after different meals by rotation  Recommended blood sugar levels on waking up are 100-130 and about 2 hours after meal is 130-180  Please bring your blood sugar monitor to each visit, thank you    Elayne Snare 09/04/2021, 11:01 AM   Note: This office note was prepared with Dragon voice recognition system technology. Any transcriptional errors that result from this process are unintentional.

## 2021-09-08 ENCOUNTER — Encounter: Payer: Self-pay | Admitting: Endocrinology

## 2021-09-15 DIAGNOSIS — L821 Other seborrheic keratosis: Secondary | ICD-10-CM | POA: Diagnosis not present

## 2021-09-15 DIAGNOSIS — R202 Paresthesia of skin: Secondary | ICD-10-CM | POA: Diagnosis not present

## 2021-09-16 ENCOUNTER — Other Ambulatory Visit: Payer: Self-pay

## 2021-09-16 DIAGNOSIS — E1165 Type 2 diabetes mellitus with hyperglycemia: Secondary | ICD-10-CM

## 2021-09-16 MED ORDER — NOVOLOG FLEXPEN 100 UNIT/ML ~~LOC~~ SOPN: PEN_INJECTOR | SUBCUTANEOUS | 2 refills | Status: DC

## 2021-09-18 ENCOUNTER — Other Ambulatory Visit: Payer: Self-pay

## 2021-09-18 DIAGNOSIS — E1165 Type 2 diabetes mellitus with hyperglycemia: Secondary | ICD-10-CM

## 2021-09-18 MED ORDER — TIRZEPATIDE 7.5 MG/0.5ML ~~LOC~~ SOAJ: 7.5000 mg | SUBCUTANEOUS | 2 refills | Status: DC

## 2021-09-18 MED ORDER — DAPAGLIFLOZIN PROPANEDIOL 10 MG PO TABS: 10.0000 mg | ORAL_TABLET | Freq: Every day | ORAL | 4 refills | Status: DC

## 2021-09-23 ENCOUNTER — Other Ambulatory Visit: Payer: Self-pay

## 2021-09-23 DIAGNOSIS — E1165 Type 2 diabetes mellitus with hyperglycemia: Secondary | ICD-10-CM

## 2021-09-23 MED ORDER — TIRZEPATIDE 7.5 MG/0.5ML ~~LOC~~ SOAJ: 7.5000 mg | SUBCUTANEOUS | 2 refills | Status: DC

## 2021-09-25 ENCOUNTER — Telehealth: Payer: Self-pay

## 2021-09-25 DIAGNOSIS — E1165 Type 2 diabetes mellitus with hyperglycemia: Secondary | ICD-10-CM

## 2021-09-25 NOTE — Telephone Encounter (Signed)
Noted  

## 2021-09-25 NOTE — Telephone Encounter (Signed)
Received paperwork from [facility] that Victoza is not covered. You wrote on paperwork that your wanted her to go to 3mg  Trulicity. Patient is currently on mounjaro 7.5mg  weekly. Please confirm that you want her to be on both medications.

## 2021-09-28 ENCOUNTER — Other Ambulatory Visit: Payer: Self-pay

## 2021-09-28 DIAGNOSIS — E1165 Type 2 diabetes mellitus with hyperglycemia: Secondary | ICD-10-CM

## 2021-09-28 MED ORDER — LANTUS SOLOSTAR 100 UNIT/ML ~~LOC~~ SOPN: 40.0000 [IU] | PEN_INJECTOR | Freq: Every day | SUBCUTANEOUS | 11 refills | Status: DC

## 2021-09-29 DIAGNOSIS — R202 Paresthesia of skin: Secondary | ICD-10-CM | POA: Diagnosis not present

## 2021-09-29 DIAGNOSIS — D239 Other benign neoplasm of skin, unspecified: Secondary | ICD-10-CM | POA: Diagnosis not present

## 2021-10-02 ENCOUNTER — Other Ambulatory Visit: Payer: Self-pay

## 2021-10-02 DIAGNOSIS — E1165 Type 2 diabetes mellitus with hyperglycemia: Secondary | ICD-10-CM

## 2021-10-02 MED ORDER — INSULIN ASPART 100 UNIT/ML IJ SOLN: INTRAMUSCULAR | 2 refills | Status: DC

## 2021-10-09 ENCOUNTER — Other Ambulatory Visit: Payer: Self-pay | Admitting: Endocrinology

## 2021-10-09 DIAGNOSIS — E1165 Type 2 diabetes mellitus with hyperglycemia: Secondary | ICD-10-CM

## 2021-10-18 ENCOUNTER — Other Ambulatory Visit: Payer: Self-pay | Admitting: Cardiovascular Disease

## 2021-10-21 ENCOUNTER — Other Ambulatory Visit: Payer: Self-pay | Admitting: Internal Medicine

## 2021-10-21 DIAGNOSIS — Z1231 Encounter for screening mammogram for malignant neoplasm of breast: Secondary | ICD-10-CM

## 2021-10-23 ENCOUNTER — Ambulatory Visit
Admission: RE | Admit: 2021-10-23 | Discharge: 2021-10-23 | Disposition: A | Discharge status: Home / Self Care | Payer: Medicare PPO | Source: Ambulatory Visit | Attending: Internal Medicine | Admitting: Internal Medicine

## 2021-10-23 DIAGNOSIS — Z1231 Encounter for screening mammogram for malignant neoplasm of breast: Secondary | ICD-10-CM | POA: Diagnosis not present

## 2021-11-03 ENCOUNTER — Other Ambulatory Visit: Payer: Self-pay

## 2021-11-03 DIAGNOSIS — E1165 Type 2 diabetes mellitus with hyperglycemia: Secondary | ICD-10-CM

## 2021-11-03 MED ORDER — V-GO 40 40 UNIT/24HR KIT: 1.0000 | PACK | Freq: Every day | 5 refills | Status: DC

## 2021-11-09 DIAGNOSIS — E1165 Type 2 diabetes mellitus with hyperglycemia: Secondary | ICD-10-CM | POA: Diagnosis not present

## 2021-11-12 DIAGNOSIS — Z86010 Personal history of colonic polyps: Secondary | ICD-10-CM | POA: Diagnosis not present

## 2021-11-12 DIAGNOSIS — I509 Heart failure, unspecified: Secondary | ICD-10-CM | POA: Diagnosis not present

## 2021-11-12 DIAGNOSIS — Z800 Family history of malignant neoplasm of digestive organs: Secondary | ICD-10-CM | POA: Diagnosis not present

## 2021-11-17 ENCOUNTER — Telehealth: Payer: Self-pay

## 2021-11-17 NOTE — Telephone Encounter (Signed)
   Name: BETSEY SOSSAMON  DOB: February 14, 1940  MRN: [id number]  Primary Cardiologist: Sanda Klein, MD  Chart reviewed as part of pre-operative protocol coverage. Because of Missi Mcmackin B[place]age's past medical history and time since last visit, she will require a follow-up in-office visit in order to better assess preoperative cardiovascular risk.  Pre-op covering staff: - Please schedule appointment and call patient to inform them. Overdue to follow up. If patient already had an upcoming appointment within acceptable timeframe, please add "pre-op clearance" to the appointment notes so provider is aware. - Please contact requesting surgeon's office via preferred method (i.e, phone, fax) to inform them of need for appointment prior to surgery.    Christell Faith, PA-C  11/17/2021, 9:23 AM

## 2021-11-17 NOTE — Telephone Encounter (Signed)
   Pre-operative Risk Assessment    Patient Name: [name]  DOB: August 16, 1940 MRN: [id number]      Request for Surgical Clearance    Procedure:   Colonoscopy 2 Date of Surgery:  Clearance TBD                                 Surgeon:  Dr Arta Silence Surgeon's Group or Practice Name:  [facility] Phone number:  [phone] Fax number:  [phone]   Type of Clearance Requested:   - Medical History of colon polyps   Type of Anesthesia:   propofol   Additional requests/questions:  Please fax a copy of cardiac clearance fax to [phone] to the surgeon's office.  Signed, Jeanmarie Plant [place]  CCMA 11/17/2021, 8:46 AM

## 2021-11-17 NOTE — Telephone Encounter (Signed)
I left a message for the pt to call back about pre op appt. At this time pt has appt with Dr. Sallyanne Kuster 01/07/22. I do not see that her procedure has a scheduled date yet.  We can off the pt a couple of options.   She can keep her appt with Dr. Loletha Grayer 01/07/22 and see him for pre op clearance as well (which means procedure not done until she see's Dr. Loletha Grayer)  Or we can schedule sooner in office appt with APP if not with Dr. Loletha Grayer for pre op clearance.   Left message for the pt to call back and let us know which way she would like to proceed. If she would like a sooner appt, the schedulers will be able to help the pt while they have her on the phone.

## 2021-11-19 NOTE — Telephone Encounter (Signed)
Patient states that she does not plan on having her colonoscopy until January.

## 2021-11-19 NOTE — Telephone Encounter (Signed)
2nd attempt to reach pt in regards to preop appt. No answer. Lvm

## 2021-11-19 NOTE — Telephone Encounter (Signed)
Patient returning call.

## 2021-11-29 ENCOUNTER — Other Ambulatory Visit: Payer: Self-pay | Admitting: Endocrinology

## 2021-11-29 DIAGNOSIS — E1165 Type 2 diabetes mellitus with hyperglycemia: Secondary | ICD-10-CM

## 2021-12-02 ENCOUNTER — Telehealth: Payer: Self-pay

## 2021-12-02 NOTE — Telephone Encounter (Signed)
Pt lvm to advise her new preferred pharmacy is [facility] on [address]. All rx should be sent to new pharmacy.

## 2021-12-23 ENCOUNTER — Other Ambulatory Visit (INDEPENDENT_AMBULATORY_CARE_PROVIDER_SITE_OTHER): Payer: Medicare PPO

## 2021-12-23 DIAGNOSIS — Z794 Long term (current) use of insulin: Secondary | ICD-10-CM | POA: Diagnosis not present

## 2021-12-23 DIAGNOSIS — E1165 Type 2 diabetes mellitus with hyperglycemia: Secondary | ICD-10-CM

## 2021-12-23 LAB — BASIC METABOLIC PANEL
BUN: 18 mg/dL (ref 6–23)
CO2: 31 mEq/L (ref 19–32)
Calcium: 9.8 mg/dL (ref 8.4–10.5)
Chloride: 93 mEq/L — ABNORMAL LOW (ref 96–112)
Creatinine, Ser: 1.06 mg/dL (ref 0.40–1.20)
GFR: 49.57 mL/min — ABNORMAL LOW (ref >60.00)
Glucose, Bld: 81 mg/dL (ref 70–99)
Potassium: 4.3 mEq/L (ref 3.5–5.1)
Sodium: 133 mEq/L — ABNORMAL LOW (ref 135–145)

## 2021-12-23 LAB — HEMOGLOBIN A1C: Hgb A1c MFr Bld: 7.1 % — ABNORMAL HIGH (ref 4.6–6.5)

## 2021-12-24 ENCOUNTER — Telehealth: Payer: Self-pay

## 2021-12-24 NOTE — Telephone Encounter (Signed)
...     Pre-operative Risk Assessment    Patient Name: [name]  DOB: 07/09/1940 MRN: [id number]      Request for Surgical Clearance    Procedure:   COLONOSCOPY  Date of Surgery:  Clearance TBD                                 Surgeon:  DR Willis Modena Surgeon's Group or Practice Name:  [facility] Phone number:  [phone] Fax number:  [phone]   Type of Clearance Requested:   - Medical    Type of Anesthesia:   PROPOFOL   Additional requests/questions:    Jola Babinski   12/24/2021, 12:14 PM

## 2021-12-24 NOTE — Telephone Encounter (Signed)
   Name: [name]  DOB: 1940-07-08  MRN: [id number]  Primary Cardiologist: Thurmon Fair, MD  Chart reviewed as part of pre-operative protocol coverage. The patient has an upcoming visit scheduled with Dr. Royann Shivers on 01/07/2022 at which time clearance can be addressed in case there are any issues that would impact surgical recommendations.  Colonoscopy is not scheduled until TBD as below. I added preop FYI to appointment note so that provider is aware to address at time of outpatient visit.  Per office protocol the cardiology provider should forward their finalized clearance decision and recommendations regarding antiplatelet therapy to the requesting party below.    I will route this message as FYI to requesting party and remove this message from the preop box as separate preop APP input not needed at this time.   Please call with any questions.  Joylene Grapes, NP  12/24/2021, 12:44 PM

## 2021-12-25 ENCOUNTER — Other Ambulatory Visit: Payer: Medicare PPO

## 2021-12-31 ENCOUNTER — Encounter (INDEPENDENT_AMBULATORY_CARE_PROVIDER_SITE_OTHER): Payer: Medicare PPO | Admitting: Ophthalmology

## 2021-12-31 DIAGNOSIS — I10 Essential (primary) hypertension: Secondary | ICD-10-CM

## 2021-12-31 DIAGNOSIS — H43813 Vitreous degeneration, bilateral: Secondary | ICD-10-CM

## 2021-12-31 DIAGNOSIS — H338 Other retinal detachments: Secondary | ICD-10-CM | POA: Diagnosis not present

## 2021-12-31 DIAGNOSIS — E113393 Type 2 diabetes mellitus with moderate nonproliferative diabetic retinopathy without macular edema, bilateral: Secondary | ICD-10-CM | POA: Diagnosis not present

## 2021-12-31 DIAGNOSIS — H35033 Hypertensive retinopathy, bilateral: Secondary | ICD-10-CM

## 2022-01-01 ENCOUNTER — Encounter: Payer: Self-pay | Admitting: Endocrinology

## 2022-01-01 ENCOUNTER — Ambulatory Visit (INDEPENDENT_AMBULATORY_CARE_PROVIDER_SITE_OTHER): Payer: Medicare PPO | Admitting: Endocrinology

## 2022-01-01 VITALS — BP 142/72 | HR 73 | Ht 63.0 in | Wt 223.0 lb

## 2022-01-01 DIAGNOSIS — E871 Hypo-osmolality and hyponatremia: Secondary | ICD-10-CM

## 2022-01-01 DIAGNOSIS — N3946 Mixed incontinence: Secondary | ICD-10-CM | POA: Diagnosis not present

## 2022-01-01 DIAGNOSIS — N3281 Overactive bladder: Secondary | ICD-10-CM | POA: Diagnosis not present

## 2022-01-01 DIAGNOSIS — Z794 Long term (current) use of insulin: Secondary | ICD-10-CM | POA: Diagnosis not present

## 2022-01-01 DIAGNOSIS — E1165 Type 2 diabetes mellitus with hyperglycemia: Secondary | ICD-10-CM | POA: Diagnosis not present

## 2022-01-01 NOTE — Patient Instructions (Addendum)
Bolus BEFORE each meal 1 extra click for eating cereal in the morning  At dinnertime take 5-6 clicks instead of 6-7

## 2022-01-01 NOTE — Progress Notes (Unsigned)
Patient ID: [name], female   DOB: 1940/07/03, 81 y.o.   MRN: [id number]    Reason for Appointment: Followup for Type 2 Diabetes    History of Present Illness:          Diagnosis: Type 2 diabetes mellitus, date of diagnosis:2011       Past history: Since she was apparently intolerant to metformin she was treated with Onglyza until about 2014  At that time because of increasing A1c of about 8% she was started on Levemir 15 units and this was aggressively increased Onglyza was continued She has not tried any other treatments for diabetes Her A1c has been 10-10.5 in 2015 In 3/15 she was told to start small doses of NovoLog at lunch and supper and add 15 units of Levemir at night also To control  hyperglycemia with her basal bolus insulin regimen she was given Victoza in addition on her initial consultation in 5/15 She has been on Victoza since 8/15  She has been on the V-go pump since 09/2014  Recent history:   INSULIN regimen :  V-go pump 40 unit basal, boluses at meal times: 10 units breakfast-10/12 at lunch 12-14 Lantus 0 units daily in a.m.   Noninsulin hypoglycemic drugs the patient is taking are: Farxiga 10 mg, daily, Mounjaro 7.5 mg weekly  Her A1c is unchanged at 7.1 compared to 7.4 earlier  Current management, blood sugar patterns and problems:  She says that after her last visit especially with continuing Mounjaro she was starting to get low sugars overnight and before meals and progressively decrease her insulin On her last visit she was told to take 20 units and now she is not taking any for the last 3 to 4 weeks Although she is having some dawn phenomenon she is still has readings as low as 120s average during the night on her CGM Discussed details of the CGM below Blood sugar may be higher after breakfast but this is primarily when she is eating cereal without any protein She does appear to be needing less insulin to cover dinner however No nausea  from [facility] However has not lost significant amount of weight yet and has limited ability to exercise    Meals: 3 meals per day.  Breakfast: Oatmeal or egg or sausage at 8-9 am sometimes with sausage for protein, dinner 5 pm.;   Night snacks will be fruit or peanut butter crackers    Side effects from medications have been: Metformin: rash  Wt Readings from Last 3 Encounters:  01/01/22 223 lb (101.2 kg)  09/03/21 225 lb (102.1 kg)  06/01/21 230 lb (104.3 kg)     Sensor description: Freestyle libre version 2  Analysis of her CGM download for the last 2 weeks is as follows  Overall blood sugars are on an average within the target range at all times Highest blood sugars are late morning and midday and lowest after 10 PM Overnight blood sugars did not show any hypoglycemia but are somewhat variable but generally in the target range, there is a significant dawn phenomenon starting around 5 AM through breakfast time Postprandial readings periodically higher after breakfast rising as much is about 250 but not consistently with highest readings around 11 AM  Blood sugars after lunch are usually only minimally higher than Premeal readings but occasionally above target Usually at dinner time blood sugars are level with the Premeal readings or sometimes fall to low normal readings by midnight  Time in range  is similar to the last visit Results statistics:  CGM use % of time 91  2-week average/GV 141/25  Time in range  88      %  % Time Above 180 11+1  % Time above 250   % Time Below 70      PRE-MEAL Fasting Lunch Dinner Bedtime Overall  Glucose range:       Averages: 140 165 139     POST-MEAL PC Breakfast PC Lunch PC Dinner  Glucose range:     Averages: 170 154 119    Previously:  CGM use % of time 94  2-week average/GV 141/21  Time in range      90%  % Time Above 180 10  % Time above 250   % Time Below 70 0     PRE-MEAL Fasting Lunch Dinner Bedtime Overall  Glucose  range:       Averages: 137       POST-MEAL PC Breakfast PC Lunch PC Dinner  Glucose range:     Averages: 155 148 145   Glycemic control:    Lab Results  Component Value Date   HGBA1C 7.1 (H) 12/23/2021   HGBA1C 7.1 (H) 08/27/2021   HGBA1C 7.4 (A) 06/01/2021   Lab Results  Component Value Date   MICROALBUR <0.7 09/19/2020   LDLCALC 82 12/24/2019   CREATININE 1.06 12/23/2021   Lab Results  Component Value Date   FRUCTOSAMINE 267 05/28/2021   FRUCTOSAMINE 223 09/16/2020   FRUCTOSAMINE 259 10/17/2017     Self-care: The diet that the patient has been following is: tries to limit fats            Dietician visit: Most recent: 2/19   CDE visit: 9/16    Weight history:  Wt Readings from Last 3 Encounters:  01/01/22 223 lb (101.2 kg)  09/03/21 225 lb (102.1 kg)  06/01/21 230 lb (104.3 kg)    Other active problems: See review of systems    Allergies as of 01/01/2022       Reactions   Flagyl [metronidazole Hcl] Itching, Rash   Ciprofloxacin Itching, Rash   Metformin And Related Rash   Methimazole Rash   Milk-related Compounds Other (See Comments)   Stomach pains   Other Other (See Comments)   [place] nuts cause severe facial redness        Medication List        Accurate as of January 01, 2022 11:59 PM. If you have any questions, ask your nurse or doctor.          Accu-Chek Guide test strip Generic drug: glucose blood Use 1-4 times daily as needed/instructed DX E11.9   Accu-Chek Guide w/Device Kit Use 1-4 times daily as needed/directed  DX E11.9   aspirin EC 81 MG tablet Take 1 tablet (81 mg total) by mouth daily.   carbamazepine 200 MG 12 hr capsule Commonly known as: CARBATROL Take 200 mg by mouth 2 (two) times daily.   clobetasol 0.05 % Gel Commonly known as: TEMOVATE Apply 1 application topically 2 (two) times daily as needed (rash).   diltiazem 240 MG 24 hr capsule Commonly known as: CARDIZEM CD TAKE 1 CAPSULE BY MOUTH AT NIGHT  AT BEDTIME   donepezil 10 MG tablet Commonly known as: ARICEPT Take 10 mg by mouth at bedtime.   esomeprazole 40 MG capsule Commonly known as: NexIUM Take 1 capsule (40 mg total) by mouth daily at 12 noon.   famotidine 20 MG tablet  Commonly known as: PEPCID Take 1 tablet (20 mg total) by mouth 2 (two) times daily.   Farxiga 10 MG Tabs tablet Generic drug: dapagliflozin propanediol TAKE 1 TABLET BY MOUTH EVERY DAY   folic acid 1 MG tablet Commonly known as: FOLVITE Take 1 mg by mouth daily.   FreeStyle Libre 2 Reader Kerrin Mo Use to check blood sugar daily   FreeStyle Libre 2 Sensor Misc Use to check blood sugar. Change every 14 days   furosemide 40 MG tablet Commonly known as: LASIX TAKE 1 TABLET BY MOUTH EVERY DAY   gabapentin 300 MG capsule Commonly known as: NEURONTIN Take 300 mg by mouth at bedtime.   HYDROcodone-acetaminophen 5-325 MG tablet Commonly known as: NORCO/VICODIN Take 1 tablet by mouth daily as needed.   Insulin Pen Needle 33G X 5 MM Misc Commonly known as: Comfort EZ Pen Needles 1 each by Does not apply route daily. Use with Tresiba pen   Insulin Pen Needle 32G X 5 MM Misc 1 each by Does not apply route daily. Use with Tyler Aas pen   BD Pen Needle Nano U/F 32G X 4 MM Misc Generic drug: Insulin Pen Needle USE 8 PEN NEEDLES PER DAY   Lantus SoloStar 100 UNIT/ML Solostar Pen Generic drug: insulin glargine Inject 40 Units into the skin at bedtime.   levothyroxine 75 MCG tablet Commonly known as: SYNTHROID Take 75 mcg by mouth daily before breakfast.   methotrexate 2.5 MG tablet Commonly known as: RHEUMATREX Take 10 mg by mouth every Monday. For rash   montelukast 5 MG chewable tablet Commonly known as: SINGULAIR Chew 5 mg by mouth at bedtime.   Mounjaro 7.5 MG/0.5ML Pen Generic drug: tirzepatide INJECT 7.5 MG SUBCUTANEOUSLY WEEKLY   NovoLOG FlexPen 100 UNIT/ML FlexPen Generic drug: insulin aspart INJECT 14 TO 16 UNITS BEFORE MEALS AS  DIRECTED   insulin aspart 100 UNIT/ML injection Commonly known as: NovoLOG USE MAX OF 80 UNITS PER DAY   OLANZapine 7.5 MG tablet Commonly known as: ZYPREXA Take 15 mg by mouth at bedtime.   olmesartan 5 MG tablet Commonly known as: BENICAR Take 5 mg by mouth daily.   ondansetron 4 MG tablet Commonly known as: ZOFRAN Take 4 mg by mouth every 8 (eight) hours as needed for nausea or vomiting.   OneTouch Delica Lancets 95M Misc USE LANCET TO TEST FIVE TIMES A DAY   Accu-Chek Softclix Lancets lancets Use 1-4 times daily as needed/instructed  DX E11.9   pravastatin 20 MG tablet Commonly known as: PRAVACHOL Take 20 mg by mouth daily.   QC Vitamin D3 50 MCG (2000 UT) Tabs Generic drug: Cholecalciferol Take 2,000 Units by mouth daily.   QUEtiapine 25 MG tablet Commonly known as: SEROQUEL Take 100 mg by mouth at bedtime.   spironolactone 25 MG tablet Commonly known as: ALDACTONE TAKE 1 TABLET BY MOUTH EVERY DAY   V-Go 40 Kit USE 1 KIT DAILY AS DIRECTED   V-Go 40 40 UNIT/24HR Kit Apply 1 kit topically daily.        Allergies:  Allergies  Allergen Reactions   Flagyl [Metronidazole Hcl] Itching and Rash   Ciprofloxacin Itching and Rash   Metformin And Related Rash   Methimazole Rash   Milk-Related Compounds Other (See Comments)    Stomach pains   Other Other (See Comments)    [place] nuts cause severe facial redness    Past Medical History:  Diagnosis Date   Bipolar disorder ([place])    Depression    Bipolar  GERD (gastroesophageal reflux disease)    History of hiatal hernia    History of kidney stones    History of stomach ulcers 1970s   "bleeding"   Hyperlipidemia    Hypertension    Hypothyroidism    Ischemic colitis, enteritis, or enterocolitis ([place])    OSA on CPAP    Pericarditis 05/06/2017   Pneumonia    "several times" (06/23/2017)   Thyroid disease    TIA (transient ischemic attack) 2011 X 2   Type II diabetes mellitus ([place])    Urinary bladder  incontinence    Vertigo     Past Surgical History:  Procedure Laterality Date   APPENDECTOMY     BREAST CYST ASPIRATION Bilateral    CARDIAC CATHETERIZATION N/A 12/24/2014   Procedure: Right/Left Heart Cath and Coronary Angiography;  Surgeon: Sanda Klein, MD;  Location: [facility];  Service: Cardiovascular;  Laterality: N/A;   CATARACT EXTRACTION W/ INTRAOCULAR LENS  IMPLANT, BILATERAL Bilateral    EXCISIONAL HEMORRHOIDECTOMY     EYE SURGERY     FRACTURE SURGERY     IR THORACENTESIS ASP PLEURAL SPACE W/IMG GUIDE  05/13/2017   LAPAROSCOPIC CHOLECYSTECTOMY     LITHOTRIPSY  "several times"   RETINAL DETACHMENT SURGERY     "think it was on the left; not sure" (06/23/2017)   TONSILLECTOMY     VAGINAL HYSTERECTOMY     partial     Family History  Problem Relation Age of Onset   Heart disease Mother    Stroke Father    Parkinson's disease Father    Cancer Sister    Cancer Brother     Social History:  reports that she has never smoked. She has never used smokeless tobacco. She reports that she does not drink alcohol and does not use drugs.    Review of Systems    NEUROPATHY:  Taking gabapentin for painful paresthesiae in her feet      HYPERLIPIDEMIA: Controlled consistently Taking pravastatin 20 mg daily Labs were done recently in 1/23 by PCP      Lab Results  Component Value Date   CHOL 149 03/26/2017   HDL 56 12/24/2019   LDLCALC 82 12/24/2019   TRIG 130 03/26/2017   CHOLHDL 3.0 03/26/2017       Thyroid:   Has had hypothyroidism for over 20 years, has not had a change in her 75 mcg levothyroxine dose in several years TSH normal as of 1/23 done by PCP:   Lab Results  Component Value Date   TSH 1.526 08/20/2020   TSH 1.71 07/04/2020   TSH 3.12 10/02/2019   FREET4 1.04 05/05/2017   FREET4 0.86 01/31/2014   FREET4 0.91 08/28/2013       The blood pressure has been managed by PCP with a combination of diltiazem  and benicar 5 mg     BP Readings from  Last 3 Encounters:  01/01/22 (!) 142/72  09/03/21 (!) 142/62  06/01/21 128/78    She was told that she had mild kidney disease but her creatinine has been Quite normal, GFR slightly decreased as expected for her age No microalbuminuria  Lab Results  Component Value Date   CREATININE 1.06 12/23/2021   CREATININE 1.13 08/27/2021   CREATININE 1.00 05/28/2021      Physical Examination:  BP (!) 142/72   Pulse 73   Ht _0  (1.6 m)   Wt 223 lb (101.2 kg)   SpO2 99%   BMI 39.50 kg/m  1+ pedal edema  ASSESSMENT/PLAN:   Diabetes type 2, with obesity  See history of present illness for detailed discussion of his current management, blood sugar patterns and problems identified  A1c is stable at 7.1  She is on a V-go pump using NovoLog along with LANTUS as basal insulin 30 units daily and also [place] and Mounjaro 7.5 mg  She has had less basal insulin requirement and likely doing overall better with adding Mounjaro She is not taking only 40 units compared to as much is 70 units total basal including the pump She is generally bolusing after eating and this was discussed Blood sugars may be better after breakfast with Premeal bolusing a few minutes before eating Also she tends to have higher readings after breakfast with eating cereal   However on the other hand frequently they may be low normal after dinner  Recommendations:   Will change her boluses to be taken before eating instead of after She needs to take another click at least to cover her breakfast if eating cereal Reduce suppertime bolus 6 dose by 2 units to potential tendency to hypoglycemia and keep blood sugars between 130-160 after meals at all times Again try to change the pump at the same time every day Call if starting to get low sugars again No change in [place] or Mounjaro   HYPONATREMIA: Likely to be from fluid overload and gradually improving, now 133  Thyroid levels: Need to be monitored and she  will request her PCP to do this next month   Total visit time for evaluation and management including counseling = 30 minutes     Patient Instructions  Bolus BEFORE each meal 1 extra click for eating cereal in the morning  At dinnertime take 5-6 clicks instead of 6-7   Nancy Arvin 01/03/2022, 11:43 AM   Note: This office note was prepared with Dragon voice recognition system technology. Any transcriptional errors that result from this process are unintentional.

## 2022-01-05 ENCOUNTER — Encounter: Payer: Self-pay | Admitting: Endocrinology

## 2022-01-07 ENCOUNTER — Ambulatory Visit: Payer: Medicare PPO | Attending: Cardiovascular Disease | Admitting: Cardiovascular Disease

## 2022-01-07 ENCOUNTER — Encounter: Payer: Self-pay | Admitting: Cardiovascular Disease

## 2022-01-07 VITALS — BP 122/68 | HR 74 | Ht 63.0 in | Wt 218.2 lb

## 2022-01-07 DIAGNOSIS — I5032 Chronic diastolic (congestive) heart failure: Secondary | ICD-10-CM | POA: Diagnosis not present

## 2022-01-07 DIAGNOSIS — Z8679 Personal history of other diseases of the circulatory system: Secondary | ICD-10-CM | POA: Diagnosis not present

## 2022-01-07 DIAGNOSIS — G4733 Obstructive sleep apnea (adult) (pediatric): Secondary | ICD-10-CM

## 2022-01-07 DIAGNOSIS — Z794 Long term (current) use of insulin: Secondary | ICD-10-CM | POA: Diagnosis not present

## 2022-01-07 DIAGNOSIS — E119 Type 2 diabetes mellitus without complications: Secondary | ICD-10-CM | POA: Diagnosis not present

## 2022-01-07 DIAGNOSIS — I10 Essential (primary) hypertension: Secondary | ICD-10-CM | POA: Diagnosis not present

## 2022-01-07 DIAGNOSIS — E782 Mixed hyperlipidemia: Secondary | ICD-10-CM | POA: Diagnosis not present

## 2022-01-07 NOTE — Patient Instructions (Signed)
Medication Instructions:   No changes  *If you need a refill on your cardiac medications before your next appointment, please call your pharmacy*   Lab Work: Not needed    Testing/Procedures:  Not  needed  Follow-Up: At [facility], you and your health needs are our priority.  As part of our continuing mission to provide you with exceptional heart care, we have created designated Provider Care Teams.  These Care Teams include your primary Cardiologist (physician) and Advanced Practice Providers (APPs -  Physician Assistants and Nurse Practitioners) who all work together to provide you with the care you need, when you need it.     Your next appointment:   12 month(s)  The format for your next appointment:   In Person  Provider:   Mihai Croitoru, MD    

## 2022-01-07 NOTE — Progress Notes (Signed)
Cardiology Office Note    Date:  01/07/2022   ID:  Mary, [name] 1940/06/23, MRN [id number]  PCP:  Merrilee Seashore, MD  Cardiologist:   Sanda Klein, MD   Chief Complaint  Patient presents with   Congestive Heart Failure         History of Present Illness:  [name] is a 81 y.o. female with obesity, type 2 diabetes mellitus, previous history of TIA and possible ischemic colitis, hypertension. She has normal left ventricular systolic function and regional wall motion without valvular abnormalities.  She had a protracted course of acute pericarditis throughout the first half of 2019.  Despite consistently normal levels of BNP, she does have intermittent shortness of breath and lower extremity edema that responds well to diuretic therapy.  However, treatment with combined metolazone-loop diuretics has led to significant hypovolemia and acute kidney injury.  She feels very well.  She has not started losing weight.  Has started [facility].  Continues to also need relatively high doses of insulin.  Denies any problems with shortness of breath at rest or with activity.  No orthopnea or PND.  Has chronic mild ankle swelling, but has not had any pretibial edema.  Denies chest pain at rest or with activity.  No focal neurological events, palpitations, dizziness or syncope.  Ever since using the freestyle libre device and the [place] prescription she has been steadily losing weight.  She is lost another 10 pounds since I last saw her and is down to 214 pounds.  Uncertain what her "dry weight" is anymore.  Cardiac MRI in April 2019 showed normal left ventricular systolic function with mild LVH and no evidence of late gadolinium enhancement.  Echo on August 10, 2017 showed complete resolution of the pericardial effusion.  The most recent echo in May 2022 showed a hyperdynamic LV with EF of 70-75%, moderate concentric LVH and grade 1 diastolic dysfunction without any pericardial  effusion or valvular abnormalities.  She had normal coronary arteries by catheterization in 2016.  She was hospitalized from August 03 through August 10/07/2020 with pneumonia.  Her presentation was actually with abdominal pain and shortness of breath.  During her hospital stay she was treated aggressively with diuretics and reportedly diuresed 7 L.  Discharge weight was 227 pounds.  CT of the abdomen and pelvis showed fatty liver changes, constipation and diverticulosis, but was otherwise benign.    Past Surgical History:  Procedure Laterality Date   APPENDECTOMY     BREAST CYST ASPIRATION Bilateral    CARDIAC CATHETERIZATION N/A 12/24/2014   Procedure: Right/Left Heart Cath and Coronary Angiography;  Surgeon: Sanda Klein, MD;  Location: [facility];  Service: Cardiovascular;  Laterality: N/A;   CATARACT EXTRACTION W/ INTRAOCULAR LENS  IMPLANT, BILATERAL Bilateral    EXCISIONAL HEMORRHOIDECTOMY     EYE SURGERY     FRACTURE SURGERY     IR THORACENTESIS ASP PLEURAL SPACE W/IMG GUIDE  05/13/2017   LAPAROSCOPIC CHOLECYSTECTOMY     LITHOTRIPSY  "several times"   RETINAL DETACHMENT SURGERY     "think it was on the left; not sure" (06/23/2017)   TONSILLECTOMY     VAGINAL HYSTERECTOMY     partial     Current Medications: Outpatient Medications Prior to Visit  Medication Sig Dispense Refill   Accu-Chek Softclix Lancets lancets Use 1-4 times daily as needed/instructed  DX E11.9 360 each 3   aspirin EC 81 MG EC tablet Take 1 tablet (81 mg total) by  mouth daily. 30 tablet 0   Blood Glucose Monitoring Suppl (ACCU-CHEK GUIDE) w/Device KIT Use 1-4 times daily as needed/directed  DX E11.9 1 kit 1   carbamazepine (CARBATROL) 200 MG 12 hr capsule Take 200 mg by mouth 2 (two) times daily.      Cholecalciferol (QC VITAMIN D3) 50 MCG (2000 UT) TABS Take 2,000 Units by mouth daily.     clobetasol (TEMOVATE) 0.05 % GEL Apply 1 application topically 2 (two) times daily as needed (rash).       Continuous Blood Gluc Receiver (FREESTYLE LIBRE 2 READER) DEVI Use to check blood sugar daily 1 each 0   Continuous Blood Gluc Sensor (FREESTYLE LIBRE 2 SENSOR) MISC Use to check blood sugar. Change every 14 days 2 each 3   dapagliflozin propanediol (FARXIGA) 10 MG TABS tablet TAKE 1 TABLET BY MOUTH EVERY DAY 30 tablet 3   diltiazem (CARDIZEM CD) 240 MG 24 hr capsule TAKE 1 CAPSULE BY MOUTH AT NIGHT AT BEDTIME 90 capsule 1   donepezil (ARICEPT) 10 MG tablet Take 10 mg by mouth at bedtime.     esomeprazole (NEXIUM) 40 MG capsule Take 1 capsule (40 mg total) by mouth daily at 12 noon. 30 capsule 2   famotidine (PEPCID) 20 MG tablet Take 1 tablet (20 mg total) by mouth 2 (two) times daily. 60 tablet 1   folic acid (FOLVITE) 1 MG tablet Take 1 mg by mouth daily.     furosemide (LASIX) 40 MG tablet TAKE 1 TABLET BY MOUTH EVERY DAY 90 tablet 1   gabapentin (NEURONTIN) 300 MG capsule Take 300 mg by mouth at bedtime.     glucose blood (ACCU-CHEK GUIDE) test strip Use 1-4 times daily as needed/instructed DX E11.9 300 each 12   insulin aspart (NOVOLOG FLEXPEN) 100 UNIT/ML FlexPen INJECT 14 TO 16 UNITS BEFORE MEALS AS DIRECTED 15 mL 2   insulin aspart (NOVOLOG) 100 UNIT/ML injection USE MAX OF 80 UNITS PER DAY 30 mL 2   Insulin Disposable Pump (V-GO 40) 40 UNIT/24HR KIT Apply 1 kit topically daily. 30 kit 5   Insulin Disposable Pump (V-GO 40) KIT USE 1 KIT DAILY AS DIRECTED 1 kit 5   Insulin Pen Needle (BD PEN NEEDLE NANO U/F) 32G X 4 MM MISC USE 8 PEN NEEDLES PER DAY 200 each 0   Insulin Pen Needle (COMFORT EZ PEN NEEDLES) 33G X 5 MM MISC 1 each by Does not apply route daily. Use with Tresiba pen 100 each 0   Insulin Pen Needle 32G X 5 MM MISC 1 each by Does not apply route daily. Use with Tresiba pen 100 each 0   levothyroxine (SYNTHROID, LEVOTHROID) 75 MCG tablet Take 75 mcg by mouth daily before breakfast.     methotrexate (RHEUMATREX) 2.5 MG tablet Take 10 mg by mouth every Monday. For rash      montelukast (SINGULAIR) 5 MG chewable tablet Chew 5 mg by mouth at bedtime.     MOUNJARO 7.5 MG/0.5ML Pen INJECT 7.5 MG SUBCUTANEOUSLY WEEKLY 6 mL 2   OLANZapine (ZYPREXA) 7.5 MG tablet Take 15 mg by mouth at bedtime.     olmesartan (BENICAR) 5 MG tablet Take 5 mg by mouth daily.     ondansetron (ZOFRAN) 4 MG tablet Take 4 mg by mouth every 8 (eight) hours as needed for nausea or vomiting.     ONETOUCH DELICA LANCETS 01X MISC USE LANCET TO TEST FIVE TIMES A DAY 200 each 0   pravastatin (PRAVACHOL) 20 MG  tablet Take 20 mg by mouth daily.     QUEtiapine (SEROQUEL) 25 MG tablet Take 100 mg by mouth at bedtime.     spironolactone (ALDACTONE) 25 MG tablet TAKE 1 TABLET BY MOUTH EVERY DAY 90 tablet 0   Vibegron (GEMTESA PO) Take 1 tablet by mouth daily in the afternoon.     HYDROcodone-acetaminophen (NORCO/VICODIN) 5-325 MG tablet Take 1 tablet by mouth daily as needed. (Patient not taking: Reported on 01/07/2022)     insulin glargine (LANTUS SOLOSTAR) 100 UNIT/ML Solostar Pen Inject 40 Units into the skin at bedtime. (Patient not taking: Reported on 01/07/2022) 15 mL 11   No facility-administered medications prior to visit.     Allergies:   Flagyl [metronidazole hcl], Ciprofloxacin, Metformin and related, Methimazole, Milk-related compounds, and Other   Social History   Socioeconomic History   Marital status: Widowed    Spouse name: Not on file   Number of children: 2   Years of education: 32   Highest education level: Not on file  Occupational History   Occupation: Retired  Tobacco Use   Smoking status: Never   Smokeless tobacco: Never  Vaping Use   Vaping Use: Never used  Substance and Sexual Activity   Alcohol use: Never   Drug use: Never   Sexual activity: Not on file  Other Topics Concern   Not on file  Social History Narrative   Lives at home alone.   Right-handed.   No caffeine use.   Social Determinants of Health   Financial Resource Strain: Not on file  Food  Insecurity: Not on file  Transportation Needs: Not on file  Physical Activity: Not on file  Stress: Not on file  Social Connections: Not on file     Family History:  The patient's family history includes Cancer in her brother and sister; Heart disease in her mother; Parkinson's disease in her father; Stroke in her father.   ROS:   Please see the history of present illness.   All other systems are reviewed and are negative.   PHYSICAL EXAM:   VS:  BP 122/68   Pulse 74   Ht _0  (1.6 m)   Wt 218 lb 3.2 oz (99 kg)   SpO2 95%   BMI 38.65 kg/m      General: Alert, oriented x3, no distress, severely obese Head: no evidence of trauma, PERRL, EOMI, no exophtalmos or lid lag, no myxedema, no xanthelasma; normal ears, nose and oropharynx Neck: normal jugular venous pulsations and no hepatojugular reflux; brisk carotid pulses without delay and no carotid bruits Chest: clear to auscultation, no signs of consolidation by percussion or palpation, normal fremitus, symmetrical and full respiratory excursions Cardiovascular: normal position and quality of the apical impulse, regular rhythm, normal first and second heart sounds, no murmurs, rubs or gallops Abdomen: no tenderness or distention, no masses by palpation, no abnormal pulsatility or arterial bruits, normal bowel sounds, no hepatosplenomegaly Extremities: no clubbing, cyanosis; mild ankle edema, but no pretibial edema, symmetrically Neurological: grossly nonfocal Psych: Normal mood and affect     Wt Readings from Last 3 Encounters:  01/07/22 218 lb 3.2 oz (99 kg)  01/01/22 223 lb (101.2 kg)  09/03/21 225 lb (102.1 kg)      Studies/Labs Reviewed:   ECHO 05/20/2020:  1. Left ventricular ejection fraction, by estimation, is 70 to 75%. The left ventricle has hyperdynamic function. The left ventricle has no regional wall motion abnormalities. There is moderate concentric left ventricular hypertrophy. Left  ventricular  diastolic  parameters are consistent with Grade I diastolic dysfunction (impaired relaxation).   2. Right ventricular systolic function is normal. The right ventricular size is normal. Tricuspid regurgitation signal is inadequate for assessing PA pressure.   3. The mitral valve is degenerative. Trivial mitral valve regurgitation. No evidence of mitral stenosis.   4. The aortic valve is tricuspid. There is mild calcification of the  aortic valve. Aortic valve regurgitation is not visualized. No aortic  stenosis is present.   Comparison(s): No significant change from prior study.   EKG:  EKG is ordered today.  It shows normal sinus rhythm with left axis deviation that looks a lot like left anterior fascicular block, although it does not quite meet the -45 degrees angle.  Unchanged wave inversion leads I and aVL, QTc 400 ms. Recent Labs: 12/23/2021: BUN 18; Creatinine, Ser 1.06; Potassium 4.3; Sodium 133   Lipid Panel    Component Value Date/Time   CHOL 149 03/26/2017 1710   TRIG 130 03/26/2017 1710   HDL 56 12/24/2019 0000   CHOLHDL 3.0 03/26/2017 1710   VLDL 26 03/26/2017 1710   LDLCALC 82 12/24/2019 0000    05/14/2020 Cholesterol 179, HDL 53, LDL 92, triglycerides 198 Hemoglobin A1c 7.5% Creatinine 1.04 Normal liver function tests 12/18/2019 TSH 2.010  02/04/2021 Cholesterol 163, HDL 48, triglycerides 191  12/23/2021 Hemoglobin A1c 7.1%, creatinine 1.06, potassium 4.3  ASSESSMENT:    1. Chronic diastolic heart failure ([place])   2. History of pericarditis   3. Essential hypertension   4. Mixed hyperlipidemia   5. Type 2 diabetes mellitus without complication, with long-term current use of insulin ([place])   6. OSA (obstructive sleep apnea)   7. Severe obesity (BMI 35.0-39.9) with comorbidity ([place])         PLAN:  In order of problems listed above:  CHF: As far as I can tell she is clinically euvolemic.  NYHA functional class I.  Dry weight is now very uncertain since she is losing  real weight.  Her BNP has never been elevated, but she has had shortness of breath and edema that has improved with diuretic therapy.  She will probably continue to have some degree of lower extremity edema even when hypovolemic due to longstanding OSA and obesity, peripheral venous insufficiency and treatment with diltiazem.  She is on an SGLT2 inhibitor. History of acute pericarditis: No evidence of effusion or constriction on her follow-up echo. HTN: Consistently well-controlled.  Ideally if she loses weight we will be able to stop her calcium channel blocker which is probably contributing to the edema. HLP: Other than mildly elevated triglycerides, other lipid parameters are in acceptable range.  Olanzapine may be contributing to her dyslipidemia. DM: Fair glycemic control, most recent A1c 7.1%. OSA: Reports, blindness with CPAP and denies daytime hypersomnolence.  Monitored by Dr. Elsworth Soho. Severe obesity: Congratulated her on her steady weight loss.  Medication Adjustments/Labs and Tests Ordered: Current medicines are reviewed at length with the patient today.  Concerns regarding medicines are outlined above.  Medication changes, Labs and Tests ordered today are listed in the Patient Instructions below. Patient Instructions  Medication Instructions:   No changes  *If you need a refill on your cardiac medications before your next appointment, please call your pharmacy*   Lab Work: Not needed    Testing/Procedures:  Not needed  Follow-Up: At [facility], you and your health needs are our priority.  As part of our continuing mission to provide you with exceptional  heart care, we have created designated Provider Care Teams.  These Care Teams include your primary Cardiologist (physician) and Advanced Practice Providers (APPs -  Physician Assistants and Nurse Practitioners) who all work together to provide you with the care you need, when you need it.     Your next appointment:   12  month(s)  The format for your next appointment:   In Person  Provider:   Sanda Klein, MD       Signed, Sanda Klein, MD  01/07/2022 1:47 PM    [facility] [address], [place], [place]  [address] Phone: [phone]; Fax: [phone]

## 2022-01-14 ENCOUNTER — Encounter: Payer: Self-pay | Admitting: Cardiovascular Disease

## 2022-01-14 ENCOUNTER — Other Ambulatory Visit: Payer: Self-pay

## 2022-01-14 DIAGNOSIS — E1165 Type 2 diabetes mellitus with hyperglycemia: Secondary | ICD-10-CM

## 2022-01-14 MED ORDER — NOVOLOG FLEXPEN 100 UNIT/ML ~~LOC~~ SOPN: PEN_INJECTOR | SUBCUTANEOUS | 2 refills | Status: DC

## 2022-01-14 NOTE — Telephone Encounter (Signed)
   Patient Name: KINSEY KARCH  DOB: Oct 01, 1940 MRN: [id number]  Primary Cardiologist: Thurmon Fair, MD  Chart reviewed as part of pre-operative protocol coverage. Given past medical history and time since last visit, based on ACC/AHA guidelines, and per Dr. Jomarie Longs, primary cardiologist,  Clayborn Bigness is at acceptable risk for the planned procedure without further cardiovascular testing.   Per Dr. Jomarie Longs, "Clearance letter sent via epic".   I will route this recommendation to the requesting party via Epic fax function and remove from pre-op pool.  Please call with questions.  Flossie Dibble, NP 01/14/2022, 11:04 AM

## 2022-02-02 DIAGNOSIS — L739 Follicular disorder, unspecified: Secondary | ICD-10-CM | POA: Diagnosis not present

## 2022-02-05 ENCOUNTER — Other Ambulatory Visit: Payer: Self-pay

## 2022-02-05 DIAGNOSIS — E1165 Type 2 diabetes mellitus with hyperglycemia: Secondary | ICD-10-CM

## 2022-02-05 MED ORDER — MOUNJARO 7.5 MG/0.5ML ~~LOC~~ SOAJ: SUBCUTANEOUS | 2 refills | Status: DC

## 2022-02-07 DIAGNOSIS — E1165 Type 2 diabetes mellitus with hyperglycemia: Secondary | ICD-10-CM | POA: Diagnosis not present

## 2022-02-10 ENCOUNTER — Other Ambulatory Visit: Payer: Self-pay | Admitting: Endocrinology

## 2022-02-10 DIAGNOSIS — E1165 Type 2 diabetes mellitus with hyperglycemia: Secondary | ICD-10-CM

## 2022-02-12 DIAGNOSIS — Z794 Long term (current) use of insulin: Secondary | ICD-10-CM | POA: Diagnosis not present

## 2022-02-12 DIAGNOSIS — F319 Bipolar disorder, unspecified: Secondary | ICD-10-CM | POA: Diagnosis not present

## 2022-02-12 DIAGNOSIS — I129 Hypertensive chronic kidney disease with stage 1 through stage 4 chronic kidney disease, or unspecified chronic kidney disease: Secondary | ICD-10-CM | POA: Diagnosis not present

## 2022-02-12 DIAGNOSIS — I5032 Chronic diastolic (congestive) heart failure: Secondary | ICD-10-CM | POA: Diagnosis not present

## 2022-02-12 DIAGNOSIS — I130 Hypertensive heart and chronic kidney disease with heart failure and stage 1 through stage 4 chronic kidney disease, or unspecified chronic kidney disease: Secondary | ICD-10-CM | POA: Diagnosis not present

## 2022-02-12 DIAGNOSIS — E782 Mixed hyperlipidemia: Secondary | ICD-10-CM | POA: Diagnosis not present

## 2022-02-12 DIAGNOSIS — E1165 Type 2 diabetes mellitus with hyperglycemia: Secondary | ICD-10-CM | POA: Diagnosis not present

## 2022-02-12 DIAGNOSIS — N1831 Chronic kidney disease, stage 3a: Secondary | ICD-10-CM | POA: Diagnosis not present

## 2022-02-12 DIAGNOSIS — E039 Hypothyroidism, unspecified: Secondary | ICD-10-CM | POA: Diagnosis not present

## 2022-02-19 DIAGNOSIS — Z0000 Encounter for general adult medical examination without abnormal findings: Secondary | ICD-10-CM | POA: Diagnosis not present

## 2022-02-19 DIAGNOSIS — I5032 Chronic diastolic (congestive) heart failure: Secondary | ICD-10-CM | POA: Diagnosis not present

## 2022-02-19 DIAGNOSIS — I129 Hypertensive chronic kidney disease with stage 1 through stage 4 chronic kidney disease, or unspecified chronic kidney disease: Secondary | ICD-10-CM | POA: Diagnosis not present

## 2022-02-19 DIAGNOSIS — E1165 Type 2 diabetes mellitus with hyperglycemia: Secondary | ICD-10-CM | POA: Diagnosis not present

## 2022-02-19 DIAGNOSIS — E782 Mixed hyperlipidemia: Secondary | ICD-10-CM | POA: Diagnosis not present

## 2022-02-19 DIAGNOSIS — I130 Hypertensive heart and chronic kidney disease with heart failure and stage 1 through stage 4 chronic kidney disease, or unspecified chronic kidney disease: Secondary | ICD-10-CM | POA: Diagnosis not present

## 2022-02-19 DIAGNOSIS — N1831 Chronic kidney disease, stage 3a: Secondary | ICD-10-CM | POA: Diagnosis not present

## 2022-02-19 DIAGNOSIS — Z794 Long term (current) use of insulin: Secondary | ICD-10-CM | POA: Diagnosis not present

## 2022-02-25 DIAGNOSIS — L814 Other melanin hyperpigmentation: Secondary | ICD-10-CM | POA: Diagnosis not present

## 2022-02-25 DIAGNOSIS — L821 Other seborrheic keratosis: Secondary | ICD-10-CM | POA: Diagnosis not present

## 2022-02-25 DIAGNOSIS — D1801 Hemangioma of skin and subcutaneous tissue: Secondary | ICD-10-CM | POA: Diagnosis not present

## 2022-02-25 DIAGNOSIS — L309 Dermatitis, unspecified: Secondary | ICD-10-CM | POA: Diagnosis not present

## 2022-02-25 DIAGNOSIS — L739 Follicular disorder, unspecified: Secondary | ICD-10-CM | POA: Diagnosis not present

## 2022-02-25 DIAGNOSIS — L578 Other skin changes due to chronic exposure to nonionizing radiation: Secondary | ICD-10-CM | POA: Diagnosis not present

## 2022-02-25 DIAGNOSIS — D229 Melanocytic nevi, unspecified: Secondary | ICD-10-CM | POA: Diagnosis not present

## 2022-02-25 DIAGNOSIS — L299 Pruritus, unspecified: Secondary | ICD-10-CM | POA: Diagnosis not present

## 2022-03-09 DIAGNOSIS — K5730 Diverticulosis of large intestine without perforation or abscess without bleeding: Secondary | ICD-10-CM | POA: Diagnosis not present

## 2022-03-09 DIAGNOSIS — Z09 Encounter for follow-up examination after completed treatment for conditions other than malignant neoplasm: Secondary | ICD-10-CM | POA: Diagnosis not present

## 2022-03-09 DIAGNOSIS — Z86010 Personal history of colonic polyps: Secondary | ICD-10-CM | POA: Diagnosis not present

## 2022-03-09 DIAGNOSIS — K649 Unspecified hemorrhoids: Secondary | ICD-10-CM | POA: Diagnosis not present

## 2022-03-09 DIAGNOSIS — D123 Benign neoplasm of transverse colon: Secondary | ICD-10-CM | POA: Diagnosis not present

## 2022-03-09 DIAGNOSIS — D127 Benign neoplasm of rectosigmoid junction: Secondary | ICD-10-CM | POA: Diagnosis not present

## 2022-03-11 DIAGNOSIS — D123 Benign neoplasm of transverse colon: Secondary | ICD-10-CM | POA: Diagnosis not present

## 2022-03-11 DIAGNOSIS — D127 Benign neoplasm of rectosigmoid junction: Secondary | ICD-10-CM | POA: Diagnosis not present

## 2022-03-26 ENCOUNTER — Telehealth: Payer: Self-pay

## 2022-03-26 NOTE — Telephone Encounter (Signed)
Pt brought letter to the office regarding coverage of Dapagliflozin 10 mg tablet. Pt has been supplied with a temp supply of the medication and has about 1 week left. Letter from insurance states that it is no longer included on their formulary drug list an is requesting an alternative. Letter did not state any covered alternatives. Please advise

## 2022-03-30 ENCOUNTER — Other Ambulatory Visit: Payer: Self-pay

## 2022-03-30 DIAGNOSIS — E1165 Type 2 diabetes mellitus with hyperglycemia: Secondary | ICD-10-CM

## 2022-03-30 MED ORDER — DAPAGLIFLOZIN PROPANEDIOL 10 MG PO TABS: 10.0000 mg | ORAL_TABLET | Freq: Every day | ORAL | 3 refills | Status: DC

## 2022-04-13 MED ORDER — FARXIGA 10 MG PO TABS: 10.0000 mg | ORAL_TABLET | Freq: Every day | ORAL | 3 refills | Status: DC

## 2022-04-13 NOTE — Telephone Encounter (Signed)
Script has been sent.

## 2022-04-13 NOTE — Addendum Note (Signed)
Addended by: Jefferson Fuel on: 04/13/2022 01:36 PM   Modules accepted: Orders

## 2022-04-20 DIAGNOSIS — E1165 Type 2 diabetes mellitus with hyperglycemia: Secondary | ICD-10-CM | POA: Diagnosis not present

## 2022-04-30 ENCOUNTER — Other Ambulatory Visit: Payer: Medicare PPO

## 2022-05-03 ENCOUNTER — Other Ambulatory Visit: Payer: Self-pay | Admitting: Endocrinology

## 2022-05-03 DIAGNOSIS — E1165 Type 2 diabetes mellitus with hyperglycemia: Secondary | ICD-10-CM

## 2022-05-04 ENCOUNTER — Ambulatory Visit: Payer: Medicare PPO | Admitting: Endocrinology

## 2022-05-05 ENCOUNTER — Telehealth: Payer: Self-pay

## 2022-05-05 NOTE — Telephone Encounter (Signed)
Pt called to advise when she takes her Mounjaro medication she has low blood sugars for the first 3-4 days. After the 4th day her sugars regulate. Asked how to manage.

## 2022-05-06 NOTE — Telephone Encounter (Signed)
LMTCB

## 2022-05-10 ENCOUNTER — Other Ambulatory Visit: Payer: Self-pay | Admitting: Endocrinology

## 2022-05-10 DIAGNOSIS — E1165 Type 2 diabetes mellitus with hyperglycemia: Secondary | ICD-10-CM

## 2022-05-10 NOTE — Telephone Encounter (Signed)
I have attempted to contact patient 3 separate times with no success. VM have been left.

## 2022-06-04 ENCOUNTER — Other Ambulatory Visit: Payer: Medicare PPO

## 2022-06-07 ENCOUNTER — Other Ambulatory Visit (INDEPENDENT_AMBULATORY_CARE_PROVIDER_SITE_OTHER): Payer: Medicare PPO

## 2022-06-07 ENCOUNTER — Other Ambulatory Visit: Payer: Self-pay | Admitting: Endocrinology

## 2022-06-07 ENCOUNTER — Other Ambulatory Visit: Payer: Medicare PPO

## 2022-06-07 DIAGNOSIS — Z794 Long term (current) use of insulin: Secondary | ICD-10-CM

## 2022-06-07 DIAGNOSIS — E1165 Type 2 diabetes mellitus with hyperglycemia: Secondary | ICD-10-CM

## 2022-06-07 DIAGNOSIS — E871 Hypo-osmolality and hyponatremia: Secondary | ICD-10-CM

## 2022-06-07 LAB — BASIC METABOLIC PANEL
BUN: 24 mg/dL — ABNORMAL HIGH (ref 6–23)
CO2: 33 mEq/L — ABNORMAL HIGH (ref 19–32)
Calcium: 10 mg/dL (ref 8.4–10.5)
Chloride: 93 mEq/L — ABNORMAL LOW (ref 96–112)
Creatinine, Ser: 1.1 mg/dL (ref 0.40–1.20)
GFR: 47.26 mL/min — ABNORMAL LOW (ref >60.00)
Glucose, Bld: 140 mg/dL — ABNORMAL HIGH (ref 70–99)
Potassium: 4.1 mEq/L (ref 3.5–5.1)
Sodium: 134 mEq/L — ABNORMAL LOW (ref 135–145)

## 2022-06-07 LAB — HEMOGLOBIN A1C: Hgb A1c MFr Bld: 7.3 % — ABNORMAL HIGH (ref 4.6–6.5)

## 2022-06-08 ENCOUNTER — Ambulatory Visit (INDEPENDENT_AMBULATORY_CARE_PROVIDER_SITE_OTHER): Payer: Medicare PPO | Admitting: Endocrinology

## 2022-06-08 ENCOUNTER — Encounter: Payer: Self-pay | Admitting: Endocrinology

## 2022-06-08 VITALS — BP 120/64 | HR 77 | Ht 63.0 in | Wt 217.4 lb

## 2022-06-08 DIAGNOSIS — I10 Essential (primary) hypertension: Secondary | ICD-10-CM

## 2022-06-08 DIAGNOSIS — E871 Hypo-osmolality and hyponatremia: Secondary | ICD-10-CM

## 2022-06-08 DIAGNOSIS — E1165 Type 2 diabetes mellitus with hyperglycemia: Secondary | ICD-10-CM

## 2022-06-08 DIAGNOSIS — Z794 Long term (current) use of insulin: Secondary | ICD-10-CM

## 2022-06-08 LAB — GLUCOSE, POCT (MANUAL RESULT ENTRY): POC Glucose: 160 mg/dl — AB (ref 70–99)

## 2022-06-08 MED ORDER — FREESTYLE LIBRE 3 SENSOR MISC
1.0000 | 2 refills | Status: AC
Start: 2022-06-08 — End: ?

## 2022-06-08 MED ORDER — FREESTYLE LIBRE 3 READER DEVI: 1.0000 [IU] | Freq: Once | 0 refills | Status: AC

## 2022-06-08 NOTE — Patient Instructions (Addendum)
Take 5-6 clicks at supper  Lantus 6 units daily and if still >150 in am go up 2 units  Call [facility] for [place] [facility]

## 2022-06-08 NOTE — Progress Notes (Signed)
Patient ID: [name], female   DOB: February 22, 1940, 82 y.o.   MRN: [id number]    Reason for Appointment: Followup for Type 2 Diabetes    History of Present Illness:          Diagnosis: Type 2 diabetes mellitus, date of diagnosis:2011       Past history: Since she was apparently intolerant to metformin she was treated with Onglyza until about 2014  At that time because of increasing A1c of about 8% she was started on Levemir 15 units and this was aggressively increased Onglyza was continued She has not tried any other treatments for diabetes Her A1c has been 10-10.5 in 2015 In 3/15 she was told to start small doses of NovoLog at lunch and supper and add 15 units of Levemir at night also To control  hyperglycemia with her basal bolus insulin regimen she was given Victoza in addition on her initial consultation in 5/15 She has been on Victoza since 8/15  She has been on the V-go pump since 09/2014  Recent history:   INSULIN regimen :  V-go pump 40 unit basal, boluses at meal times: 10 units breakfast-10/12 at lunch and 12-14 at dinnertime  Lantus: Not taking   Noninsulin hypoglycemic drugs the patient is taking are: Farxiga 10 mg, daily, Mounjaro 7.5 mg weekly  Her A1c is slightly higher at 7.3  Current management, blood sugar patterns and problems:  She was doing much better previously but recently her blood sugars appear to be generally higher and not clear why She is taking her Mounjaro regularly at the same dose Although she thinks that her blood sugars are generally lower for couple of days after her shot there is no consistent pattern Last night she had a smaller meal with only 1 carbohydrate and blood sugar was mildly hypoglycemic about 5 hours later  She is not adjusting her boluses based on what she is eating Also trying to bolus before starting to bleed as discussed before Without taking Lantus her overnight blood sugars are mostly high Has relatively  normal appetite and no recent intercurrent illnesses On her last visit she was told to reduce her coverage at dinnertime but she is still taking 14 units, lowest blood sugars are after 9 PM during the day No nausea from [facility] Her weight is -3lbs from her last visit She changes her pump regularly in the afternoon daily    Meals: 3 meals per day.  Breakfast: Oatmeal or egg or sausage at 8-9 am sometimes with sausage for protein, dinner 5 pm.;   Night snacks will be fruit or peanut butter crackers    Side effects from medications have been: Metformin: rash  Sensor description: Freestyle libre version 2  Analysis of her CGM download for the last 2 weeks is as follows  Overall blood sugars are higher compared to the last visit and an average around the upper limit of the target range at all times except around 9-10 PM Highest blood sugars are in the mid afternoon Overnight blood sugars are somewhat variable but mostly running relatively high with some variability and occasional significant increase after midnight Blood sugars before meals are consistently high as seen on the averages discussed below POSTPRANDIAL readings are usually not increasing significantly after any given meal with occasionally rising more after lunch Blood sugars occasionally been in the low normal around midnight including last night when she had mild hypoglycemia after midnight CGM was reading 150 and fingerstick reading was  160 in the office  Results statistics:  CGM use % of time   2-week average/GV 179  Time in range        56% was 88  % Time Above 180 33  % Time above 250 10  % Time Below 70 1     PRE-MEAL Fasting Lunch Dinner Bedtime Overall  Glucose range:       Averages: 170   157    POST-MEAL PC Breakfast PC Lunch PC Dinner  Glucose range:     Averages: 178 203 174   Previously:  CGM use % of time 91  2-week average/GV 141/25  Time in range  88      %  % Time Above 180 11+1  % Time above  250   % Time Below 70      PRE-MEAL Fasting Lunch Dinner Bedtime Overall  Glucose range:       Averages: 140 165 139     POST-MEAL PC Breakfast PC Lunch PC Dinner  Glucose range:     Averages: 170 154 119   Wt Readings from Last 3 Encounters:  06/08/22 217 lb 6.4 oz (98.6 kg)  01/07/22 218 lb 3.2 oz (99 kg)  01/01/22 223 lb (101.2 kg)     Glycemic control:    Lab Results  Component Value Date   HGBA1C 7.3 (H) 06/07/2022   HGBA1C 7.1 (H) 12/23/2021   HGBA1C 7.1 (H) 08/27/2021   Lab Results  Component Value Date   MICROALBUR <0.7 09/19/2020   LDLCALC 82 12/24/2019   CREATININE 1.10 06/07/2022   Lab Results  Component Value Date   FRUCTOSAMINE 267 05/28/2021   FRUCTOSAMINE 223 09/16/2020   FRUCTOSAMINE 259 10/17/2017     Self-care: The diet that the patient has been following is: tries to limit fats          Dietician visit: Most recent: 2/19   CDE visit: 9/16    Weight history:  Wt Readings from Last 3 Encounters:  06/08/22 217 lb 6.4 oz (98.6 kg)  01/07/22 218 lb 3.2 oz (99 kg)  01/01/22 223 lb (101.2 kg)    Other active problems: See review of systems    Allergies as of 06/08/2022       Reactions   Flagyl [metronidazole Hcl] Itching, Rash   Ciprofloxacin Itching, Rash   Metformin And Related Rash   Methimazole Rash   Milk-related Compounds Other (See Comments)   Stomach pains   Other Other (See Comments)   [place] nuts cause severe facial redness        Medication List        Accurate as of Jun 08, 2022  3:18 PM. If you have any questions, ask your nurse or doctor.          Accu-Chek Guide test strip Generic drug: glucose blood Use 1-4 times daily as needed/instructed DX E11.9   Accu-Chek Guide w/Device Kit Use 1-4 times daily as needed/directed  DX E11.9   aspirin EC 81 MG tablet Take 1 tablet (81 mg total) by mouth daily.   carbamazepine 200 MG 12 hr capsule Commonly known as: CARBATROL Take 200 mg by mouth 2 (two)  times daily.   clobetasol 0.05 % Gel Commonly known as: TEMOVATE Apply 1 application topically 2 (two) times daily as needed (rash).   dapagliflozin propanediol 10 MG Tabs tablet Commonly known as: Farxiga Take 1 tablet (10 mg total) by mouth daily.   diltiazem 240 MG 24 hr capsule Commonly  known as: CARDIZEM CD TAKE 1 CAPSULE BY MOUTH AT NIGHT AT BEDTIME   donepezil 10 MG tablet Commonly known as: ARICEPT Take 10 mg by mouth at bedtime.   esomeprazole 40 MG capsule Commonly known as: NexIUM Take 1 capsule (40 mg total) by mouth daily at 12 noon.   famotidine 20 MG tablet Commonly known as: PEPCID Take 1 tablet (20 mg total) by mouth 2 (two) times daily.   folic acid 1 MG tablet Commonly known as: FOLVITE Take 1 mg by mouth daily.   FreeStyle Libre 2 Sensor Misc Use to check blood sugar. Change every 14 days What changed: Another medication with the same name was added. Make sure you understand how and when to take each. Changed by: Reather Littler, MD   FreeStyle Libre 3 Sensor Misc 1 Device by Does not apply route every 14 (fourteen) days. Apply 1 sensor on upper arm every 14 days for continuous glucose monitoring What changed: You were already taking a medication with the same name, and this prescription was added. Make sure you understand how and when to take each. Changed by: Reather Littler, MD   FreeStyle Libre 3 Reader [facility] 1 Units by Does not apply route once for 1 dose. What changed:  how much to take how to take this when to take this additional instructions Changed by: Reather Littler, MD   furosemide 40 MG tablet Commonly known as: LASIX TAKE 1 TABLET BY MOUTH EVERY DAY   gabapentin 300 MG capsule Commonly known as: NEURONTIN Take 300 mg by mouth at bedtime.   GEMTESA PO Take 1 tablet by mouth daily in the afternoon.   insulin aspart 100 UNIT/ML injection Commonly known as: novoLOG INJECT 80 UNITS MAX PER DAY   Insulin Aspart FlexPen 100 UNIT/ML Commonly  known as: NOVOLOG ADMINISTER 14 TO 16 UNITS UNDER THE SKIN BEFORE MEALS AS DIRECTED   Insulin Pen Needle 33G X 5 MM Misc Commonly known as: Comfort EZ Pen Needles 1 each by Does not apply route daily. Use with Tresiba pen   Insulin Pen Needle 32G X 5 MM Misc 1 each by Does not apply route daily. Use with Evaristo Bury pen   BD Pen Needle Nano U/F 32G X 4 MM Misc Generic drug: Insulin Pen Needle USE 8 PEN NEEDLES PER DAY   levothyroxine 75 MCG tablet Commonly known as: SYNTHROID Take 75 mcg by mouth daily before breakfast.   methotrexate 2.5 MG tablet Commonly known as: RHEUMATREX Take 10 mg by mouth every Monday. For rash   montelukast 5 MG chewable tablet Commonly known as: SINGULAIR Chew 5 mg by mouth at bedtime.   Mounjaro 7.5 MG/0.5ML Pen Generic drug: tirzepatide INJECT 7.5 MG INTO THE SKIN ONCE WEEKLY   OLANZapine 7.5 MG tablet Commonly known as: ZYPREXA Take 15 mg by mouth at bedtime.   olmesartan 5 MG tablet Commonly known as: BENICAR Take 5 mg by mouth daily.   ondansetron 4 MG tablet Commonly known as: ZOFRAN Take 4 mg by mouth every 8 (eight) hours as needed for nausea or vomiting.   OneTouch Delica Lancets 33G Misc USE LANCET TO TEST FIVE TIMES A DAY   Accu-Chek Softclix Lancets lancets Use 1-4 times daily as needed/instructed  DX E11.9   pravastatin 20 MG tablet Commonly known as: PRAVACHOL Take 20 mg by mouth daily.   QC Vitamin D3 50 MCG (2000 UT) Tabs Generic drug: Cholecalciferol Take 2,000 Units by mouth daily.   QUEtiapine 25 MG tablet Commonly known  as: SEROQUEL Take 100 mg by mouth at bedtime.   spironolactone 25 MG tablet Commonly known as: ALDACTONE TAKE 1 TABLET BY MOUTH EVERY DAY   V-Go 40 Kit USE 1 KIT DAILY AS DIRECTED   V-Go 40 40 UNIT/24HR Kit Apply 1 kit topically daily.        Allergies:  Allergies  Allergen Reactions   Flagyl [Metronidazole Hcl] Itching and Rash   Ciprofloxacin Itching and Rash   Metformin And  Related Rash   Methimazole Rash   Milk-Related Compounds Other (See Comments)    Stomach pains   Other Other (See Comments)    [place] nuts cause severe facial redness    Past Medical History:  Diagnosis Date   Bipolar disorder (HCC)    Depression    Bipolar   GERD (gastroesophageal reflux disease)    History of hiatal hernia    History of kidney stones    History of stomach ulcers 1970s   "bleeding"   Hyperlipidemia    Hypertension    Hypothyroidism    Ischemic colitis, enteritis, or enterocolitis (HCC)    OSA on CPAP    Pericarditis 05/06/2017   Pneumonia    "several times" (06/23/2017)   Thyroid disease    TIA (transient ischemic attack) 2011 X 2   Type II diabetes mellitus (HCC)    Urinary bladder incontinence    Vertigo     Past Surgical History:  Procedure Laterality Date   APPENDECTOMY     BREAST CYST ASPIRATION Bilateral    CARDIAC CATHETERIZATION N/A 12/24/2014   Procedure: Right/Left Heart Cath and Coronary Angiography;  Surgeon: Thurmon Fair, MD;  Location: [facility];  Service: Cardiovascular;  Laterality: N/A;   CATARACT EXTRACTION W/ INTRAOCULAR LENS  IMPLANT, BILATERAL Bilateral    EXCISIONAL HEMORRHOIDECTOMY     EYE SURGERY     FRACTURE SURGERY     IR THORACENTESIS ASP PLEURAL SPACE W/IMG GUIDE  05/13/2017   LAPAROSCOPIC CHOLECYSTECTOMY     LITHOTRIPSY  "several times"   RETINAL DETACHMENT SURGERY     "think it was on the left; not sure" (06/23/2017)   TONSILLECTOMY     VAGINAL HYSTERECTOMY     partial     Family History  Problem Relation Age of Onset   Heart disease Mother    Stroke Father    Parkinson's disease Father    Cancer Sister    Cancer Brother     Social History:  reports that she has never smoked. She has never used smokeless tobacco. She reports that she does not drink alcohol and does not use drugs.    Review of Systems    NEUROPATHY:  Taking gabapentin for painful paresthesiae in her feet      HYPERLIPIDEMIA:  Controlled consistently Taking pravastatin 20 mg daily Labs were done in 1/23 by PCP      Lab Results  Component Value Date   CHOL 149 03/26/2017   HDL 56 12/24/2019   LDLCALC 82 12/24/2019   TRIG 130 03/26/2017   CHOLHDL 3.0 03/26/2017       Thyroid:   Has had hypothyroidism for over 20 years, has not had a change in her 75 mcg levothyroxine dose in several years.  Followed by PCP TSH normal as of 1/23 done by PCP:   Lab Results  Component Value Date   TSH 1.526 08/20/2020   TSH 1.71 07/04/2020   TSH 3.12 10/02/2019   FREET4 1.04 05/05/2017   FREET4 0.86 01/31/2014  FREET4 0.91 08/28/2013       The blood pressure has been managed by PCP with a combination of diltiazem  and benicar 5 mg     BP Readings from Last 3 Encounters:  06/08/22 120/64  01/07/22 122/68  01/01/22 (!) 142/72    Renal function: Normal No microalbuminuria  Lab Results  Component Value Date   CREATININE 1.10 06/07/2022   CREATININE 1.06 12/23/2021   CREATININE 1.13 08/27/2021      Physical Examination:  BP 120/64 (BP Location: Left Arm, Patient Position: Sitting, Cuff Size: Normal)   Pulse 77   Ht 5\' 3"  (1.6 m)   Wt 217 lb 6.4 oz (98.6 kg)   SpO2 97%   BMI 38.51 kg/m    ASSESSMENT/PLAN:   Diabetes type 2, with obesity  See history of present illness for detailed discussion of his current management, blood sugar patterns and problems identified  A1c is stable at 7.3, last visit was in 12/15  She is on a V-go pump using NovoLog along with  Marcelline Deist and Mounjaro 7.5 mg  Although she was able to stop Lantus after her last visit she is now again recently showing higher blood sugars overnight Also periodically will have high readings at different times including overnight with no consistent pattern Overall time in range is only 56% No consistent with some postprandial hyperglycemia As before periodically blood sugars may be relatively low after dinner including mild hypoglycemia  last night  Recommendations:   Will change her bolus dose to about 10 to 12 units instead of 14 at dinnertime based on her meal size Adjust boluses at other meals based on meal size Start back on Lantus 6 units daily and increase gradually until morning sugars are at least below 150 No change in Mounjaro doses for now More regular follow-up She will call [facility] to get the libre 3 if approved by insurance for better accuracy and monitoring completely   HYPONATREMIA: Relatively better  Thyroid levels: Need to be rechecked and she will request her PCP to do this at upcoming appointment this month   Total visit time for evaluation and management including counseling = 30 minutes   Patient Instructions  Take 5-6 clicks at supper  Lantus 6 units daily and if still >150 in am go up 2 units  Call [facility] for LIbre 3   Reather Littler 06/08/2022, 3:18 PM   Note: This office note was prepared with Dragon voice recognition system technology. Any transcriptional errors that result from this process are unintentional.

## 2022-06-09 DIAGNOSIS — R2681 Unsteadiness on feet: Secondary | ICD-10-CM | POA: Diagnosis not present

## 2022-06-09 DIAGNOSIS — R5383 Other fatigue: Secondary | ICD-10-CM | POA: Diagnosis not present

## 2022-06-09 DIAGNOSIS — E1165 Type 2 diabetes mellitus with hyperglycemia: Secondary | ICD-10-CM | POA: Diagnosis not present

## 2022-06-09 DIAGNOSIS — N1831 Chronic kidney disease, stage 3a: Secondary | ICD-10-CM | POA: Diagnosis not present

## 2022-06-09 DIAGNOSIS — E039 Hypothyroidism, unspecified: Secondary | ICD-10-CM | POA: Diagnosis not present

## 2022-06-09 DIAGNOSIS — L309 Dermatitis, unspecified: Secondary | ICD-10-CM | POA: Diagnosis not present

## 2022-06-09 DIAGNOSIS — L739 Follicular disorder, unspecified: Secondary | ICD-10-CM | POA: Diagnosis not present

## 2022-06-09 DIAGNOSIS — L299 Pruritus, unspecified: Secondary | ICD-10-CM | POA: Diagnosis not present

## 2022-06-09 DIAGNOSIS — I130 Hypertensive heart and chronic kidney disease with heart failure and stage 1 through stage 4 chronic kidney disease, or unspecified chronic kidney disease: Secondary | ICD-10-CM | POA: Diagnosis not present

## 2022-06-10 ENCOUNTER — Encounter: Payer: Self-pay | Admitting: Endocrinology

## 2022-07-02 DIAGNOSIS — I130 Hypertensive heart and chronic kidney disease with heart failure and stage 1 through stage 4 chronic kidney disease, or unspecified chronic kidney disease: Secondary | ICD-10-CM | POA: Diagnosis not present

## 2022-07-02 DIAGNOSIS — E782 Mixed hyperlipidemia: Secondary | ICD-10-CM | POA: Diagnosis not present

## 2022-07-02 DIAGNOSIS — I5032 Chronic diastolic (congestive) heart failure: Secondary | ICD-10-CM | POA: Diagnosis not present

## 2022-07-02 DIAGNOSIS — E1165 Type 2 diabetes mellitus with hyperglycemia: Secondary | ICD-10-CM | POA: Diagnosis not present

## 2022-07-12 ENCOUNTER — Other Ambulatory Visit: Payer: Self-pay | Admitting: Endocrinology

## 2022-07-12 DIAGNOSIS — E1165 Type 2 diabetes mellitus with hyperglycemia: Secondary | ICD-10-CM

## 2022-07-15 ENCOUNTER — Other Ambulatory Visit: Payer: Self-pay | Admitting: Endocrinology

## 2022-07-15 ENCOUNTER — Other Ambulatory Visit: Payer: Self-pay

## 2022-07-15 DIAGNOSIS — E1165 Type 2 diabetes mellitus with hyperglycemia: Secondary | ICD-10-CM

## 2022-07-15 MED ORDER — DAPAGLIFLOZIN PROPANEDIOL 10 MG PO TABS
ORAL_TABLET | ORAL | 2 refills | Status: AC
Start: 2022-07-15 — End: ?

## 2022-07-16 DIAGNOSIS — I5032 Chronic diastolic (congestive) heart failure: Secondary | ICD-10-CM | POA: Diagnosis not present

## 2022-07-16 DIAGNOSIS — Z23 Encounter for immunization: Secondary | ICD-10-CM | POA: Diagnosis not present

## 2022-07-16 DIAGNOSIS — E782 Mixed hyperlipidemia: Secondary | ICD-10-CM | POA: Diagnosis not present

## 2022-07-16 DIAGNOSIS — E039 Hypothyroidism, unspecified: Secondary | ICD-10-CM | POA: Diagnosis not present

## 2022-07-16 DIAGNOSIS — I130 Hypertensive heart and chronic kidney disease with heart failure and stage 1 through stage 4 chronic kidney disease, or unspecified chronic kidney disease: Secondary | ICD-10-CM | POA: Diagnosis not present

## 2022-07-16 DIAGNOSIS — E1165 Type 2 diabetes mellitus with hyperglycemia: Secondary | ICD-10-CM | POA: Diagnosis not present

## 2022-07-16 DIAGNOSIS — Z794 Long term (current) use of insulin: Secondary | ICD-10-CM | POA: Diagnosis not present

## 2022-07-16 DIAGNOSIS — I129 Hypertensive chronic kidney disease with stage 1 through stage 4 chronic kidney disease, or unspecified chronic kidney disease: Secondary | ICD-10-CM | POA: Diagnosis not present

## 2022-07-16 DIAGNOSIS — N1831 Chronic kidney disease, stage 3a: Secondary | ICD-10-CM | POA: Diagnosis not present

## 2022-07-19 ENCOUNTER — Telehealth: Payer: Self-pay | Admitting: Nutrition

## 2022-07-19 DIAGNOSIS — E1165 Type 2 diabetes mellitus with hyperglycemia: Secondary | ICD-10-CM | POA: Diagnosis not present

## 2022-07-19 NOTE — Telephone Encounter (Signed)
Patient received her [name] 3 sensors but did not receive a reader.  Please order this for her.  She is using [facility].  Thank you

## 2022-07-26 NOTE — Telephone Encounter (Signed)
Thank you :)

## 2022-07-27 ENCOUNTER — Telehealth: Payer: Self-pay | Admitting: Nutrition

## 2022-07-27 NOTE — Telephone Encounter (Unsigned)
Patient called saying that she got the [place] 3 sensors but no reader.  Please order this for her from [facility] ASAP.  Her old sensor will expire in 10 days  Thank you

## 2022-07-29 DIAGNOSIS — E1165 Type 2 diabetes mellitus with hyperglycemia: Secondary | ICD-10-CM | POA: Diagnosis not present

## 2022-08-02 ENCOUNTER — Telehealth: Payer: Self-pay | Admitting: Cardiovascular Disease

## 2022-08-02 MED ORDER — DILTIAZEM HCL ER COATED BEADS 240 MG PO CP24: 240.0000 mg | ORAL_CAPSULE | Freq: Every day | ORAL | 1 refills | Status: DC

## 2022-08-02 NOTE — Telephone Encounter (Signed)
*  STAT* If patient is at the pharmacy, call can be transferred to refill team.   1. Which medications need to be refilled? (please list name of each medication and dose if known)   diltiazem (CARDIZEM CD) 240 MG 24 hr capsule    2. Which pharmacy/location (including street and city if local pharmacy) is medication to be sent to?  [id number] - [place], [place] - [address]      3. Do they need a 30 day or 90 day supply? 90 day    Pt is completely out of medication

## 2022-08-06 DIAGNOSIS — Z79899 Other long term (current) drug therapy: Secondary | ICD-10-CM | POA: Diagnosis not present

## 2022-08-06 DIAGNOSIS — F339 Major depressive disorder, recurrent, unspecified: Secondary | ICD-10-CM | POA: Diagnosis not present

## 2022-08-09 ENCOUNTER — Other Ambulatory Visit: Payer: Self-pay | Admitting: Endocrinology

## 2022-08-09 DIAGNOSIS — E1165 Type 2 diabetes mellitus with hyperglycemia: Secondary | ICD-10-CM

## 2022-08-13 ENCOUNTER — Other Ambulatory Visit (INDEPENDENT_AMBULATORY_CARE_PROVIDER_SITE_OTHER): Payer: Medicare PPO

## 2022-08-13 ENCOUNTER — Other Ambulatory Visit: Payer: Self-pay | Admitting: Endocrinology

## 2022-08-13 DIAGNOSIS — E1165 Type 2 diabetes mellitus with hyperglycemia: Secondary | ICD-10-CM

## 2022-08-13 DIAGNOSIS — E063 Autoimmune thyroiditis: Secondary | ICD-10-CM | POA: Diagnosis not present

## 2022-08-13 DIAGNOSIS — Z794 Long term (current) use of insulin: Secondary | ICD-10-CM | POA: Diagnosis not present

## 2022-08-13 LAB — BASIC METABOLIC PANEL
BUN: 18 mg/dL (ref 6–23)
CO2: 29 mEq/L (ref 19–32)
Calcium: 9.5 mg/dL (ref 8.4–10.5)
Chloride: 98 mEq/L (ref 96–112)
Creatinine, Ser: 1.07 mg/dL (ref 0.40–1.20)
GFR: 48.79 mL/min — ABNORMAL LOW (ref >60.00)
Glucose, Bld: 184 mg/dL — ABNORMAL HIGH (ref 70–99)
Potassium: 4.3 mEq/L (ref 3.5–5.1)
Sodium: 137 mEq/L (ref 135–145)

## 2022-08-13 LAB — MICROALBUMIN / CREATININE URINE RATIO
Creatinine,U: 32.1 mg/dL
Microalb Creat Ratio: 2.2 mg/g (ref 0.0–30.0)
Microalb, Ur: <0.7 mg/dL (ref 0.0–1.9)

## 2022-08-13 LAB — TSH: TSH: 2.15 u[IU]/mL (ref 0.35–5.50)

## 2022-08-13 LAB — HEMOGLOBIN A1C: Hgb A1c MFr Bld: 7.7 % — ABNORMAL HIGH (ref 4.6–6.5)

## 2022-08-20 ENCOUNTER — Encounter: Payer: Self-pay | Admitting: Endocrinology

## 2022-08-20 ENCOUNTER — Ambulatory Visit: Payer: Medicare PPO | Admitting: Endocrinology

## 2022-08-20 VITALS — BP 124/80 | HR 75 | Ht 63.0 in | Wt 219.6 lb

## 2022-08-20 DIAGNOSIS — Z794 Long term (current) use of insulin: Secondary | ICD-10-CM | POA: Diagnosis not present

## 2022-08-20 DIAGNOSIS — E1165 Type 2 diabetes mellitus with hyperglycemia: Secondary | ICD-10-CM | POA: Diagnosis not present

## 2022-08-20 DIAGNOSIS — I10 Essential (primary) hypertension: Secondary | ICD-10-CM

## 2022-08-20 DIAGNOSIS — E063 Autoimmune thyroiditis: Secondary | ICD-10-CM

## 2022-08-20 MED ORDER — FREESTYLE LIBRE 3 SENSOR MISC: 1.0000 | 2 refills | Status: DC

## 2022-08-20 MED ORDER — FREESTYLE LIBRE 3 READER DEVI: 0 refills | Status: DC

## 2022-08-20 NOTE — Patient Instructions (Addendum)
Take 7 clicks at bFst and 7 clicks lunch

## 2022-08-20 NOTE — Progress Notes (Unsigned)
Patient ID: [name], female   DOB: Jun 09, 1940, 82 y.o.   MRN: [id number]    Reason for Appointment: Followup for Type 2 Diabetes    History of Present Illness:          Diagnosis: Type 2 diabetes mellitus, date of diagnosis:2011       Past history: Since she was apparently intolerant to metformin she was treated with Onglyza until about 2014  At that time because of increasing A1c of about 8% she was started on Levemir 15 units and this was aggressively increased Onglyza was continued She has not tried any other treatments for diabetes Her A1c has been 10-10.5 in 2015 In 3/15 she was told to start small doses of NovoLog at lunch and supper and add 15 units of Levemir at night also To control  hyperglycemia with her basal bolus insulin regimen she was given Victoza in addition on her initial consultation in 5/15 She has been on Victoza since 8/15  She has been on the V-go pump since 09/2014  Recent history:   INSULIN regimen :  V-go pump 40 unit basal, boluses at meal times: 10 units breakfast (7 am)-10/12 at lunch and 12-14 at dinnertime  Lantus: 7 units   Noninsulin hypoglycemic drugs the patient is taking are: Farxiga 10 mg, daily, Mounjaro 7.5 mg weekly  Her A1c is slightly higher at 7.7  Current management, blood sugar patterns and problems:  She was started back on low-dose Lantus on the last visit because overnight hyperglycemia With only 7 units her morning sugars are not as high but although she has some from day-to-day variability However her time in range is not as good diet only 55% and she has significantly high readings during the day This is despite changing her pump consistently at the same time She has not increased her boluses even with higher readings at meals She does continue her Mounjaro regularly without side effect She may have fasting readings as low as 95 much better previously but recently her blood sugars appear to be generally  higher and not clear why She is taking her Mounjaro regularly at the same dose She is fairly consistent with monitoring her blood sugars No nausea from [facility] Her weight is same [employer] indoor but not as much as before    Meals: 3 meals per day.  Breakfast: Oatmeal or egg or sausage at 8-9 am sometimes with sausage for protein, dinner 5 pm.;   Night snacks will be fruit or peanut butter crackers    Side effects from medications have been: Metformin: rash  Sensor description: Freestyle libre version 2  Analysis of her CGM download for the last 2 weeks is as follows  Overall blood sugars are generally variable from day-to-day with most consistent pattern being relatively high readings during the day but better overnight and late evening She has periods of HYPERGLYCEMIA at all different times during the day but no consistent mealtime spikes and sometimes with higher readings overnight also Generally overnight readings are highly variable but generally not above 180 and minimal hypoglycemia As above postprandial readings are variable with some rise after breakfast or lunch but not as much in the evening No hypoglycemia during the day   Results statistics:  CGM use % of time   2-week average/GV 170  Time in range   53     %  % Time Above 180 44  % Time above 250 2  % Time Below 70 1  PRE-MEAL Fasting Lunch Dinner Bedtime Overall  Glucose range:       Averages: 169 194 167     POST-MEAL PC Breakfast PC Lunch PC Dinner  Glucose range:     Averages: 194 208 165   Previously:  CGM use % of time   2-week average/GV 179  Time in range        56% was 88  % Time Above 180 33  % Time above 250 10  % Time Below 70 1     PRE-MEAL Fasting Lunch Dinner Bedtime Overall  Glucose range:       Averages: 170   157    POST-MEAL PC Breakfast PC Lunch PC Dinner  Glucose range:     Averages: 178 203 174     Wt Readings from Last 3 Encounters:  08/20/22 219 lb 9.6 oz (99.6 kg)   06/08/22 217 lb 6.4 oz (98.6 kg)  01/07/22 218 lb 3.2 oz (99 kg)     Glycemic control:    Lab Results  Component Value Date   HGBA1C 7.7 (H) 08/13/2022   HGBA1C 7.3 (H) 06/07/2022   HGBA1C 7.1 (H) 12/23/2021   Lab Results  Component Value Date   MICROALBUR <0.7 08/13/2022   LDLCALC 82 12/24/2019   CREATININE 1.07 08/13/2022   Lab Results  Component Value Date   FRUCTOSAMINE 267 05/28/2021   FRUCTOSAMINE 223 09/16/2020   FRUCTOSAMINE 259 10/17/2017     Self-care: The diet that the patient has been following is: tries to limit fats          Dietician visit: Most recent: 2/19   CDE visit: 9/16    Weight history:  Wt Readings from Last 3 Encounters:  08/20/22 219 lb 9.6 oz (99.6 kg)  06/08/22 217 lb 6.4 oz (98.6 kg)  01/07/22 218 lb 3.2 oz (99 kg)    Other active problems: See review of systems    Allergies as of 08/20/2022       Reactions   Flagyl [metronidazole Hcl] Itching, Rash   Ciprofloxacin Itching, Rash   Metformin And Related Rash   Methimazole Rash   Milk-related Compounds Other (See Comments)   Stomach pains   Other Other (See Comments)   [place] nuts cause severe facial redness        Medication List        Accurate as of August 20, 2022 10:54 AM. If you have any questions, ask your nurse or doctor.          Accu-Chek Guide test strip Generic drug: glucose blood Use 1-4 times daily as needed/instructed DX E11.9   Accu-Chek Guide w/Device Kit Use 1-4 times daily as needed/directed  DX E11.9   aspirin EC 81 MG tablet Take 1 tablet (81 mg total) by mouth daily.   carbamazepine 200 MG 12 hr capsule Commonly known as: CARBATROL Take 200 mg by mouth 2 (two) times daily.   clobetasol 0.05 % Gel Commonly known as: TEMOVATE Apply 1 application topically 2 (two) times daily as needed (rash).   dapagliflozin propanediol 10 MG Tabs tablet Commonly known as: Farxiga TAKE 1 TABLET(10 MG) BY MOUTH DAILY   diltiazem 240 MG 24 hr  capsule Commonly known as: CARDIZEM CD Take 1 capsule (240 mg total) by mouth daily.   donepezil 10 MG tablet Commonly known as: ARICEPT Take 10 mg by mouth at bedtime.   esomeprazole 40 MG capsule Commonly known as: NexIUM Take 1 capsule (40 mg total) by mouth daily  at 12 noon.   famotidine 20 MG tablet Commonly known as: PEPCID Take 1 tablet (20 mg total) by mouth 2 (two) times daily.   folic acid 1 MG tablet Commonly known as: FOLVITE Take 1 mg by mouth daily.   FreeStyle Libre 2 Sensor Misc Use to check blood sugar. Change every 14 days What changed: Another medication with the same name was added. Make sure you understand how and when to take each. Changed by: Reather Littler   FreeStyle Libre 3 Sensor Misc 1 Device by Does not apply route every 14 (fourteen) days. Apply 1 sensor on upper arm every 14 days for continuous glucose monitoring What changed: Another medication with the same name was added. Make sure you understand how and when to take each. Changed by: Reather Littler   FreeStyle Libre 3 Sensor Misc 1 Device by Does not apply route every 14 (fourteen) days. Apply 1 sensor on upper arm every 14 days for continuous glucose monitoring What changed: You were already taking a medication with the same name, and this prescription was added. Make sure you understand how and when to take each. Changed by: Reather Littler   FreeStyle Libre 3 Reader Hardie Pulley Use for sensor Started by: Reather Littler   furosemide 40 MG tablet Commonly known as: LASIX TAKE 1 TABLET BY MOUTH EVERY DAY   gabapentin 300 MG capsule Commonly known as: NEURONTIN Take 300 mg by mouth at bedtime.   GEMTESA PO Take 1 tablet by mouth daily in the afternoon.   insulin aspart 100 UNIT/ML injection Commonly known as: novoLOG INJECT 80 UNITS MAX PER DAY   Insulin Aspart FlexPen 100 UNIT/ML Commonly known as: NOVOLOG ADMINISTER 14 TO 16 UNITS UNDER THE SKIN BEFORE MEALS AS DIRECTED   Insulin Pen Needle 33G X  5 MM Misc Commonly known as: Comfort EZ Pen Needles 1 each by Does not apply route daily. Use with Tresiba pen   Insulin Pen Needle 32G X 5 MM Misc 1 each by Does not apply route daily. Use with Evaristo Bury pen   BD Pen Needle Nano U/F 32G X 4 MM Misc Generic drug: Insulin Pen Needle USE 8 PEN NEEDLES PER DAY   levothyroxine 75 MCG tablet Commonly known as: SYNTHROID Take 75 mcg by mouth daily before breakfast.   methotrexate 2.5 MG tablet Commonly known as: RHEUMATREX Take 10 mg by mouth every Monday. For rash   montelukast 5 MG chewable tablet Commonly known as: SINGULAIR Chew 5 mg by mouth at bedtime.   Mounjaro 7.5 MG/0.5ML Pen Generic drug: tirzepatide INJECT 7.5 MG INTO THE SKIN ONCE WEEKLY   OLANZapine 7.5 MG tablet Commonly known as: ZYPREXA Take 15 mg by mouth at bedtime.   olmesartan 5 MG tablet Commonly known as: BENICAR Take 5 mg by mouth daily.   ondansetron 4 MG tablet Commonly known as: ZOFRAN Take 4 mg by mouth every 8 (eight) hours as needed for nausea or vomiting.   OneTouch Delica Lancets 33G Misc USE LANCET TO TEST FIVE TIMES A DAY   Accu-Chek Softclix Lancets lancets Use 1-4 times daily as needed/instructed  DX E11.9   pravastatin 20 MG tablet Commonly known as: PRAVACHOL Take 20 mg by mouth daily.   QC Vitamin D3 50 MCG (2000 UT) Tabs Generic drug: Cholecalciferol Take 2,000 Units by mouth daily.   QUEtiapine 25 MG tablet Commonly known as: SEROQUEL Take 100 mg by mouth at bedtime.   spironolactone 25 MG tablet Commonly known as: ALDACTONE TAKE 1 TABLET  BY MOUTH EVERY DAY   V-Go 40 Kit USE 1 KIT DAILY AS DIRECTED   V-Go 40 40 UNIT/24HR Kit Apply 1 kit topically daily.        Allergies:  Allergies  Allergen Reactions   Flagyl [Metronidazole Hcl] Itching and Rash   Ciprofloxacin Itching and Rash   Metformin And Related Rash   Methimazole Rash   Milk-Related Compounds Other (See Comments)    Stomach pains   Other Other  (See Comments)    [place] nuts cause severe facial redness    Past Medical History:  Diagnosis Date   Bipolar disorder (HCC)    Depression    Bipolar   GERD (gastroesophageal reflux disease)    History of hiatal hernia    History of kidney stones    History of stomach ulcers 1970s   "bleeding"   Hyperlipidemia    Hypertension    Hypothyroidism    Ischemic colitis, enteritis, or enterocolitis (HCC)    OSA on CPAP    Pericarditis 05/06/2017   Pneumonia    "several times" (06/23/2017)   Thyroid disease    TIA (transient ischemic attack) 2011 X 2   Type II diabetes mellitus (HCC)    Urinary bladder incontinence    Vertigo     Past Surgical History:  Procedure Laterality Date   APPENDECTOMY     BREAST CYST ASPIRATION Bilateral    CARDIAC CATHETERIZATION N/A 12/24/2014   Procedure: Right/Left Heart Cath and Coronary Angiography;  Surgeon: Thurmon Fair, MD;  Location: [facility];  Service: Cardiovascular;  Laterality: N/A;   CATARACT EXTRACTION W/ INTRAOCULAR LENS  IMPLANT, BILATERAL Bilateral    EXCISIONAL HEMORRHOIDECTOMY     EYE SURGERY     FRACTURE SURGERY     IR THORACENTESIS ASP PLEURAL SPACE W/IMG GUIDE  05/13/2017   LAPAROSCOPIC CHOLECYSTECTOMY     LITHOTRIPSY  "several times"   RETINAL DETACHMENT SURGERY     "think it was on the left; not sure" (06/23/2017)   TONSILLECTOMY     VAGINAL HYSTERECTOMY     partial     Family History  Problem Relation Age of Onset   Heart disease Mother    Stroke Father    Parkinson's disease Father    Cancer Sister    Cancer Brother     Social History:  reports that she has never smoked. She has never used smokeless tobacco. She reports that she does not drink alcohol and does not use drugs.    Review of Systems    NEUROPATHY:  Taking gabapentin for painful paresthesiae in her feet      HYPERLIPIDEMIA: Controlled consistently Taking pravastatin 20 mg daily Labs were done in 1/23 by PCP      Lab Results   Component Value Date   CHOL 149 03/26/2017   HDL 56 12/24/2019   LDLCALC 82 12/24/2019   TRIG 130 03/26/2017   CHOLHDL 3.0 03/26/2017       Thyroid:   Has had hypothyroidism for over 20 years, has not had a change in her 75 mcg levothyroxine dose in several years.  Followed by PCP TSH normal as of 1/23 done by PCP:   Lab Results  Component Value Date   TSH 2.15 08/13/2022   TSH 1.526 08/20/2020   TSH 1.71 07/04/2020   FREET4 1.04 05/05/2017   FREET4 0.86 01/31/2014   FREET4 0.91 08/28/2013       The blood pressure has been managed by PCP with a combination of  diltiazem  and benicar 5 mg     BP Readings from Last 3 Encounters:  08/20/22 124/80  06/08/22 120/64  01/07/22 122/68    Renal function: Normal No microalbuminuria  Lab Results  Component Value Date   CREATININE 1.07 08/13/2022   CREATININE 1.10 06/07/2022   CREATININE 1.06 12/23/2021      Physical Examination:  BP 124/80   Pulse 75   Ht 5\' 3"  (1.6 m)   Wt 219 lb 9.6 oz (99.6 kg)   SpO2 96%   BMI 38.90 kg/m    ASSESSMENT/PLAN:   Diabetes type 2, with obesity  See history of present illness for detailed discussion of his current management, blood sugar patterns and problems identified  A1c is 7.7, previously was at 7.3  She is on a V-go pump using NovoLog along with low-dose Lantus, Farxiga and Mounjaro 7.5 mg  Although she was able to stop Lantus after her last visit she is now again recently showing higher blood sugars overnight Also periodically will have high readings at different times including overnight with no consistent pattern Overall time in range is only 56% No consistent with some postprandial hyperglycemia As before periodically blood sugars may be relatively low after dinner including mild hypoglycemia last night  Recommendations:   Will change her bolus dose to an average of 14 at all meals She can adjust the bolus by 1 click up or down based on her Premeal blood sugars  or what she is eating Continue 7 units of Lantus for now Based on her meal size Adjust boluses at other meals based on meal size Start back on Lantus 6 units daily and increase gradually until morning sugars are at least below 150 No change in Mounjaro doses for now More regular follow-up  She will be able to get the [place] 3 sensor apparently if she buys the reader. She thinks she can afford this and given a written prescription for the reader She will need to start using the libre 3 sensor Discussed benefits of the [place] 3 sensor compared to the [place] 2   HYPONATREMIA: Resolved   Thyroid levels: Normal and she can continue the same dose of levothyroxine   Total visit time for evaluation and management including counseling = 30 minutes   Patient Instructions  Take 7 clicks at bFst and 7 clicks lunch     Reather Littler 08/20/2022, 10:54 AM   Note: This office note was prepared with Dragon voice recognition system technology. Any transcriptional errors that result from this process are unintentional.

## 2022-08-23 ENCOUNTER — Encounter: Payer: Self-pay | Admitting: Endocrinology

## 2022-08-24 ENCOUNTER — Encounter: Payer: Self-pay | Admitting: Endocrinology

## 2022-09-06 ENCOUNTER — Other Ambulatory Visit: Payer: Self-pay

## 2022-09-06 DIAGNOSIS — E1165 Type 2 diabetes mellitus with hyperglycemia: Secondary | ICD-10-CM

## 2022-09-06 MED ORDER — INSULIN ASPART 100 UNIT/ML IJ SOLN
INTRAMUSCULAR | 2 refills | Status: DC
Start: 2022-09-06 — End: 2022-09-16

## 2022-09-14 ENCOUNTER — Telehealth: Payer: Self-pay | Admitting: Cardiovascular Disease

## 2022-09-14 NOTE — Telephone Encounter (Signed)
Pt c/o swelling/edema: STAT if pt has developed SOB within 24 hours  If swelling, where is the swelling located? Legs, ankles, and feet  How much weight have you gained and in what time span?  4 lbs in a day   Have you gained 2 pounds in a day or 5 pounds in a week? Yes  Do you have a log of your daily weights (if so, list)?  08/27 224 lbs 08/26 220 lbs 08/25 220 lbs 08/24 224 lbs 08/23 224 lbs All readings are first thing in the morning   Are you currently taking a fluid pill? Yes  Are you currently SOB? No   Have you traveled recently in a car or plane for an extended period of time? No   Reports she has been wearing her compression socks, but they have not helped. States she is weighing about 225-226 lbs in the afternoon. In the past two months she has went from 229 lbs to 224 lbs.

## 2022-09-14 NOTE — Telephone Encounter (Signed)
Spoke to patient she stated she has been having swelling in both lower legs for the past 1 month or longer.Stated she gained 4 lbs over night.No sob.Advised to double lasix dose for the next 3 days then return to normal dose.Appointment scheduled with Edd Fabian NP 9/3 at 2:45 pm.

## 2022-09-15 DIAGNOSIS — E1165 Type 2 diabetes mellitus with hyperglycemia: Secondary | ICD-10-CM | POA: Diagnosis not present

## 2022-09-15 NOTE — Progress Notes (Unsigned)
Cardiology Clinic Note   Patient Name: [name] Date of Encounter: 09/21/2022  Primary Care Provider:  Georgianne Fick, MD Primary Cardiologist:  Thurmon Fair, MD  Patient Profile    [name] 82 year old female presents to the clinic today for evaluation of her lower extremity swelling and weight gain. Past Medical History    Past Medical History:  Diagnosis Date   Bipolar disorder (HCC)    Depression    Bipolar   GERD (gastroesophageal reflux disease)    History of hiatal hernia    History of kidney stones    History of stomach ulcers 1970s   "bleeding"   Hyperlipidemia    Hypertension    Hypothyroidism    Ischemic colitis, enteritis, or enterocolitis (HCC)    OSA on CPAP    Pericarditis 05/06/2017   Pneumonia    "several times" (06/23/2017)   Thyroid disease    TIA (transient ischemic attack) 2011 X 2   Type II diabetes mellitus (HCC)    Urinary bladder incontinence    Vertigo    Past Surgical History:  Procedure Laterality Date   APPENDECTOMY     BREAST CYST ASPIRATION Bilateral    CARDIAC CATHETERIZATION N/A 12/24/2014   Procedure: Right/Left Heart Cath and Coronary Angiography;  Surgeon: Thurmon Fair, MD;  Location: [facility];  Service: Cardiovascular;  Laterality: N/A;   CATARACT EXTRACTION W/ INTRAOCULAR LENS  IMPLANT, BILATERAL Bilateral    EXCISIONAL HEMORRHOIDECTOMY     EYE SURGERY     FRACTURE SURGERY     IR THORACENTESIS ASP PLEURAL SPACE W/IMG GUIDE  05/13/2017   LAPAROSCOPIC CHOLECYSTECTOMY     LITHOTRIPSY  "several times"   RETINAL DETACHMENT SURGERY     "think it was on the left; not sure" (06/23/2017)   TONSILLECTOMY     VAGINAL HYSTERECTOMY     partial     Allergies  Allergies  Allergen Reactions   Flagyl [Metronidazole Hcl] Itching and Rash   Ciprofloxacin Itching and Rash   Metformin And Related Rash   Methimazole Rash   Milk-Related Compounds Other (See Comments)    Stomach pains   Other Other (See  Comments)    [place] nuts cause severe facial redness    History of Present Illness    [name] has a PMH of hypertension, angina, pericardial effusion, OSA , bilateral pleural effusion, community-acquired pneumonia, type 2 diabetes, AKI, dyspnea, hyperlipidemia, obesity, atypical chest pain, syncope, sepsis, abdominal pain and new atrial flutter.  She also had previous TIA.  Her echocardiogram showed normal left systolic function and normal wall motion.  She was not noted to have valvular abnormalities.  She did have acute pericarditis in 2019.  She was noted to have consistent normal levels of BNP.  She previously was noted to have intermittent episodes of shortness of breath and lower extremity edema that responded well to diuretics.  She did receive a course of metolazone and loop diuretics which caused significant hypovolemia and acute kidney injury.  She had cardiac MRI 4/19 which showed normal LV systolic function, mild LVH and no evidence of LGE.  Echocardiogram 7/19 showed complete resolution of her pericardial effusion.  Echocardiogram 5/22 showed LVEF of 70-75%, moderate concentric LVH and G1 DD.  She underwent cardiac catheterization in 2016 which showed normal coronaries.  She was last seen by Dr. Royann Shivers 01/07/2022.  During that time she felt well.  She had been started on Mounjaro.  She had not started losing weight.  She continued to need fairly high doses of insulin.  She denied PND and orthopnea.  She denied shortness of breath with increased activity.  She was noted to have chronic mild ankle swelling.  She denied chest pain, presyncope and syncope.  She presents to the clinic today for evaluation of her weight gain and lower extremity edema.  She states she is doing well, has had significant improvement in her lower extremity edema although does not feel she is back to her normal. She does endorse some mild dyspnea on exertion. She denies chest pain, or orthopnea. She  endorses intermittent palpitations, feeling of fast heart rate the last few months. Reports she initially felt it was related to increased caffeine intake, drinking 6-7 cups of coffee a day, now drinks 3 daily.   Today she denies chest pain, fatigue, melena, hematuria, hemoptysis, diaphoresis, weakness, presyncope, syncope, orthopnea, and PND.  Home Medications    Prior to Admission medications   Medication Sig Start Date End Date Taking? Authorizing Provider  Accu-Chek Softclix Lancets lancets Use 1-4 times daily as needed/instructed  DX E11.9 10/27/19   Reather Littler, MD  aspirin EC 81 MG EC tablet Take 1 tablet (81 mg total) by mouth daily. 05/18/17   Burnadette Pop, MD  Blood Glucose Monitoring Suppl (ACCU-CHEK GUIDE) w/Device KIT Use 1-4 times daily as needed/directed  DX E11.9 10/27/19   Reather Littler, MD  carbamazepine (CARBATROL) 200 MG 12 hr capsule Take 200 mg by mouth 2 (two) times daily.     [provider]  Cholecalciferol (QC VITAMIN D3) 50 MCG (2000 UT) TABS Take 2,000 Units by mouth daily.    [provider]  clobetasol (TEMOVATE) 0.05 % GEL Apply 1 application topically 2 (two) times daily as needed (rash).     [provider]  Continuous Blood Gluc Sensor (FREESTYLE LIBRE 2 SENSOR) MISC Use to check blood sugar. Change every 14 days 09/17/20   Reather Littler, MD  Continuous Glucose Receiver (FREESTYLE LIBRE 3 READER) DEVI Use for sensor 08/20/22   Reather Littler, MD  Continuous Glucose Sensor (FREESTYLE LIBRE 3 SENSOR) MISC 1 Device by Does not apply route every 14 (fourteen) days. Apply 1 sensor on upper arm every 14 days for continuous glucose monitoring 06/08/22   Reather Littler, MD  Continuous Glucose Sensor (FREESTYLE LIBRE 3 SENSOR) MISC 1 Device by Does not apply route every 14 (fourteen) days. Apply 1 sensor on upper arm every 14 days for continuous glucose monitoring 08/20/22   Reather Littler, MD  dapagliflozin propanediol (FARXIGA) 10 MG TABS tablet TAKE 1 TABLET(10  MG) BY MOUTH DAILY 07/15/22   Reather Littler, MD  diltiazem (CARDIZEM CD) 240 MG 24 hr capsule Take 1 capsule (240 mg total) by mouth daily. 08/02/22   Croitoru, Mihai, MD  donepezil (ARICEPT) 10 MG tablet Take 10 mg by mouth at bedtime. 01/22/19   [provider]  esomeprazole (NEXIUM) 40 MG capsule Take 1 capsule (40 mg total) by mouth daily at 12 noon. 06/27/17   Oretha Milch, MD  famotidine (PEPCID) 20 MG tablet Take 1 tablet (20 mg total) by mouth 2 (two) times daily. 01/27/21   Croitoru, Mihai, MD  folic acid (FOLVITE) 1 MG tablet Take 1 mg by mouth daily.    [provider]  furosemide (LASIX) 40 MG tablet TAKE 1 TABLET BY MOUTH EVERY DAY 08/19/21   Croitoru, Mihai, MD  gabapentin (NEURONTIN) 300 MG capsule Take 300 mg by mouth at bedtime.    [provider]  glucose blood (ACCU-CHEK GUIDE) test strip Use 1-4 times daily as needed/instructed DX E11.9 10/27/19   Reather Littler, MD  insulin aspart (NOVOLOG) 100 UNIT/ML injection INJECT 80 UNITS MAX PER DAY 09/06/22   Reather Littler, MD  Insulin Aspart FlexPen (NOVOLOG) 100 UNIT/ML ADMINISTER 14 TO 16 UNITS UNDER THE SKIN BEFORE MEALS AS DIRECTED 08/09/22   Reather Littler, MD  Insulin Disposable Pump (V-GO 40) 40 UNIT/24HR KIT Apply 1 kit topically daily. 11/03/21   Reather Littler, MD  Insulin Disposable Pump (V-GO 40) KIT USE 1 KIT DAILY AS DIRECTED 05/28/20   Reather Littler, MD  Insulin Pen Needle (BD PEN NEEDLE NANO U/F) 32G X 4 MM MISC USE 8 PEN NEEDLES PER DAY 09/18/18   Reather Littler, MD  Insulin Pen Needle (COMFORT EZ PEN NEEDLES) 33G X 5 MM MISC 1 each by Does not apply route daily. Use with Evaristo Bury pen 09/23/17   Reather Littler, MD  Insulin Pen Needle 32G X 5 MM MISC 1 each by Does not apply route daily. Use with Evaristo Bury pen 09/27/17   Reather Littler, MD  levothyroxine (SYNTHROID, LEVOTHROID) 75 MCG tablet Take 75 mcg by mouth daily before breakfast.    [provider]  methotrexate (RHEUMATREX) 2.5 MG tablet Take 10 mg by mouth every  Monday. For rash    [provider]  montelukast (SINGULAIR) 5 MG chewable tablet Chew 5 mg by mouth at bedtime.    [provider]  OLANZapine (ZYPREXA) 7.5 MG tablet Take 15 mg by mouth at bedtime.    [provider]  olmesartan (BENICAR) 5 MG tablet Take 5 mg by mouth daily.    [provider]  ondansetron (ZOFRAN) 4 MG tablet Take 4 mg by mouth every 8 (eight) hours as needed for nausea or vomiting.    [provider]  Dola Argyle LANCETS 33G MISC USE LANCET TO TEST FIVE TIMES A DAY 05/31/17   Reather Littler, MD  pravastatin (PRAVACHOL) 20 MG tablet Take 20 mg by mouth daily. 07/14/20   [provider]  QUEtiapine (SEROQUEL) 25 MG tablet Take 100 mg by mouth at bedtime. 12/29/18   [provider]  spironolactone (ALDACTONE) 25 MG tablet TAKE 1 TABLET BY MOUTH EVERY DAY 10/19/21   Croitoru, Mihai, MD  tirzepatide [facility]) 7.5 MG/0.5ML Pen INJECT 7.5 MG INTO THE SKIN ONCE WEEKLY 05/10/22   Reather Littler, MD  Vibegron (GEMTESA PO) Take 1 tablet by mouth daily in the afternoon.    [provider]    Family History    Family History  Problem Relation Age of Onset   Heart disease Mother    Stroke Father    Parkinson's disease Father    Cancer Sister    Cancer Brother    She indicated that her mother is alive. She indicated that her father is deceased. She indicated that her sister is alive. She indicated that the status of her brother is unknown.  Social History    Social History   Socioeconomic History   Marital status: Widowed    Spouse name: Not on file   Number of children: 2   Years of education: 65   Highest education level: Not on file  Occupational History   Occupation: Retired  Tobacco Use   Smoking status: Never   Smokeless tobacco: Never  Vaping Use   Vaping status: Never Used  Substance and Sexual Activity   Alcohol use: Never   Drug use: Never   Sexual  activity: Not on file  Other Topics  Concern   Not on file  Social History Narrative   Lives at home alone.   Right-handed.   No caffeine use.   Social Determinants of Health   Financial Resource Strain: Not on file  Food Insecurity: Not on file  Transportation Needs: Not on file  Physical Activity: Not on file  Stress: Not on file  Social Connections: Not on file  Intimate Partner Violence: Not on file     Review of Systems    General:  No chills, fever, night sweats or weight changes.  Cardiovascular:  No chest pain, orthopnea, paroxysmal nocturnal dyspnea. Dermatological: No rash, lesions/masses Respiratory: No cough, dyspnea Urologic: No hematuria, dysuria Abdominal:   No nausea, vomiting, diarrhea, bright red blood per rectum, melena, or hematemesis Neurologic:  No visual changes, wkns, changes in mental status. All other systems reviewed and are otherwise negative except as noted above.  Physical Exam    VS:  BP 134/80   Pulse 70   Ht 5\' 3"  (1.6 m)   Wt 220 lb (99.8 kg)   SpO2 95%   BMI 38.97 kg/m  , BMI Body mass index is 38.97 kg/m. GEN: Well nourished, well developed, in no acute distress. HEENT: normal. Neck: Supple, no JVD, carotid bruits, or masses. Cardiac: irregular rate and rhythm, no murmurs, rubs, or gallops. No clubbing, cyanosis. +1 pitting edema in bilateral feet. Radials/DP/PT 2+ and equal bilaterally.  Respiratory:  Respirations regular and unlabored, clear to auscultation bilaterally. GI: Soft, nontender, nondistended, BS + x 4. MS: no deformity or atrophy. Skin: warm and dry, no rash. Neuro:  Strength and sensation are intact. Psych: Normal affect.  Accessory Clinical Findings    Recent Labs: 08/13/2022: BUN 18; Creatinine, Ser 1.07; Potassium 4.3; Sodium 137; TSH 2.15   Recent Lipid Panel    Component Value Date/Time   CHOL 149 03/26/2017 1710   TRIG 130 03/26/2017 1710   HDL 56 12/24/2019 0000   CHOLHDL 3.0 03/26/2017 1710   VLDL 26 03/26/2017 1710   LDLCALC 82  12/24/2019 0000      ECG personally reviewed by me today- EKG Interpretation Date/Time:  Tuesday September 21 2022 14:34:26 EDT Ventricular Rate:  70 PR Interval:    QRS Duration:  82 QT Interval:  374 QTC Calculation: 403 R Axis:   -45  Text Interpretation: Atrial flutter with variable A-V block Left axis deviation Minimal voltage criteria for LVH, may be normal variant ( Cornell product ) ST & T wave abnormality, consider lateral ischemia Confirmed by Reather Littler [phone]) on 09/21/2022 3:19:01 PM    Echocardiogram 05/20/2020  IMPRESSIONS   1. Left ventricular ejection fraction, by estimation, is 70 to 75%. The  left ventricle has hyperdynamic function. The left ventricle has no  regional wall motion abnormalities. There is moderate concentric left  ventricular hypertrophy. Left ventricular  diastolic parameters are consistent with Grade I diastolic dysfunction  (impaired relaxation).   2. Right ventricular systolic function is normal. The right ventricular  size is normal. Tricuspid regurgitation signal is inadequate for assessing  PA pressure.   3. The mitral valve is degenerative. Trivial mitral valve regurgitation.  No evidence of mitral stenosis.   4. The aortic valve is tricuspid. There is mild calcification of the  aortic valve. Aortic valve regurgitation is not visualized. No aortic  stenosis is present.   Comparison(s): No significant change from prior study.   FINDINGS   Left Ventricle: Left ventricular ejection fraction,  by estimation, is 70  to 75%. The left ventricle has hyperdynamic function. The left ventricle  has no regional wall motion abnormalities. The left ventricular internal  cavity size was normal in size.  There is moderate concentric left ventricular hypertrophy. Left  ventricular diastolic parameters are consistent with Grade I diastolic  dysfunction (impaired relaxation).   Right Ventricle: The right ventricular size is normal. No increase in   right ventricular wall thickness. Right ventricular systolic function is  normal. Tricuspid regurgitation signal is inadequate for assessing PA  pressure.   Left Atrium: Left atrial size was normal in size.   Right Atrium: Right atrial size was normal in size.   Pericardium: Trivial pericardial effusion is present. Presence of  pericardial fat pad.   Mitral Valve: The mitral valve is degenerative in appearance. Mild mitral  annular calcification. Trivial mitral valve regurgitation. No evidence of  mitral valve stenosis.   Tricuspid Valve: The tricuspid valve is grossly normal. Tricuspid valve  regurgitation is not demonstrated. No evidence of tricuspid stenosis.   Aortic Valve: The aortic valve is tricuspid. There is mild calcification  of the aortic valve. Aortic valve regurgitation is not visualized. No  aortic stenosis is present.   Pulmonic Valve: The pulmonic valve was grossly normal. Pulmonic valve  regurgitation is not visualized. No evidence of pulmonic stenosis.   Aorta: The aortic root and ascending aorta are structurally normal, with  no evidence of dilitation.   Venous: The inferior vena cava was not well visualized.   IAS/Shunts: The atrial septum is grossly normal.   Assessment & Plan   Atrial flutter/Palpitations- EKG today shows atrial flutter at 70 bpm. Today she does endorse intermittent palpitations that have been ongoing for the last few months with slightly more dyspnea on exertion. Initially felt it was related to increased caffeine intake but persisted following decreased intake. Reviewed triggers such as caffeine, chocolate, ETOH, electrolyte imbalances or dehydration.  Reviewed ED precautions Will plan for her to return to clinic in three weeks to further discuss cardioversion.  CHA2DS2-VASc Score = 8 [CHF History: 1, HTN History: 1, Diabetes History: 1, Stroke History: 2, Vascular Disease History: 0, Age Score: 2, Gender Score: 1].  Therefore, the  patient's annual risk of stroke is 10.8 %.     Start Eliquis 5 mg twice daily Continue Cardizem 240 mg daily  Check CBC, BMET, Mag and TSH today.    Chronic diastolic CHF/ Lower extremity edema-weight today 220 lbs.  NYHA class II-III.  +1 pitting edema on tops of her feet. Reviewed home weights, frequent fluctuations in weight, around 2 lbs.  Notified office on 09/14/22 of increased swelling and weight gain, instructed to take 40mg  twice daily for three days. She notes improvement following this.  Heart healthy low-sodium diet Daily weights-contact office with a weight increase of 2 to 3 pounds overnight or 5 pounds in 1 week Elevate lower extremities when not active Lower extremity support stockings Continue spironolactone, Farxiga, olmesartan and furosemide 40 mg daily.  Can consider increasing furosemide as needed pending lab results and new atrial flutter.   Wt Readings from Last 3 Encounters:  09/21/22 220 lb (99.8 kg)  08/20/22 219 lb 9.6 oz (99.6 kg)  06/08/22 217 lb 6.4 oz (98.6 kg)   Essential hypertension- Initial blood pressure today 142/76, on recheck was 134/80. Maintain blood pressure log Continue current medical therapy Heart healthy low-sodium diet  Hyperlipidemia- LDL 46 in 06/2022. High-fiber diet Continue aspirin, pravastatin  Type 2 diabetes-A1c 7.7  on 08/13/2022. Carb modified diet Continue insulin therapy Follows with PCP  Disposition: Follow-up with Dr. Royann Shivers or Edd Fabian, NP in three weeks.    Reather Littler, NP     09/21/2022, 4:06 PM [name] [address] Office [phone] Fax [phone]

## 2022-09-16 ENCOUNTER — Other Ambulatory Visit: Payer: Self-pay

## 2022-09-16 DIAGNOSIS — E1165 Type 2 diabetes mellitus with hyperglycemia: Secondary | ICD-10-CM

## 2022-09-16 MED ORDER — INSULIN ASPART 100 UNIT/ML IJ SOLN
INTRAMUSCULAR | 2 refills | Status: AC
Start: 2022-09-16 — End: ?

## 2022-09-21 ENCOUNTER — Encounter: Payer: Self-pay | Admitting: General Practice

## 2022-09-21 ENCOUNTER — Ambulatory Visit: Payer: Medicare PPO | Attending: General Practice | Admitting: Cardiology

## 2022-09-21 VITALS — BP 134/80 | HR 70 | Ht 63.0 in | Wt 220.0 lb

## 2022-09-21 DIAGNOSIS — I4892 Unspecified atrial flutter: Secondary | ICD-10-CM

## 2022-09-21 DIAGNOSIS — E782 Mixed hyperlipidemia: Secondary | ICD-10-CM | POA: Diagnosis not present

## 2022-09-21 DIAGNOSIS — E119 Type 2 diabetes mellitus without complications: Secondary | ICD-10-CM | POA: Diagnosis not present

## 2022-09-21 DIAGNOSIS — I5032 Chronic diastolic (congestive) heart failure: Secondary | ICD-10-CM

## 2022-09-21 DIAGNOSIS — R600 Localized edema: Secondary | ICD-10-CM

## 2022-09-21 DIAGNOSIS — Z794 Long term (current) use of insulin: Secondary | ICD-10-CM | POA: Diagnosis not present

## 2022-09-21 DIAGNOSIS — E7800 Pure hypercholesterolemia, unspecified: Secondary | ICD-10-CM | POA: Diagnosis not present

## 2022-09-21 DIAGNOSIS — I10 Essential (primary) hypertension: Secondary | ICD-10-CM | POA: Diagnosis not present

## 2022-09-21 MED ORDER — APIXABAN 5 MG PO TABS: 5.0000 mg | ORAL_TABLET | Freq: Two times a day (BID) | ORAL | 6 refills | Status: DC

## 2022-09-21 NOTE — Patient Instructions (Signed)
Medication Instructions:  START ELIQUIS 5MG  TWICE DAILY *If you need a refill on your cardiac medications before your next appointment, please call your pharmacy*  Lab Work: CBC, MAG,BMET AND TSH TODAY If you have labs (blood work) drawn today and your tests are completely normal, you will receive your results only by:  MyChart Message (if you have MyChart) OR  A paper copy in the mail If you have any lab test that is abnormal or we need to change your treatment, we will call you to review the results.  Testing/Procedures: NONE   Follow-Up: At [facility], you and your health needs are our priority.  As part of our continuing mission to provide you with exceptional heart care, we have created designated Provider Care Teams.  These Care Teams include your primary Cardiologist (physician) and Advanced Practice Providers (APPs -  Physician Assistants and Nurse Practitioners) who all work together to provide you with the care you need, when you need it.  Your next appointment:   3-4 week(s)  Provider:   Thurmon Fair, MD , Edd Fabian, FNP or Reather Littler, NP     ,  Other Instructions REVIEW THE ATRIAL FLUTTER TEACHING BELOW    Atrial Flutter  Atrial flutter is a type of abnormal heart rhythm (arrhythmia). The heart has an electrical system that tells it how to beat. In atrial flutter, the signals move rapidly in the top chambers of the heart (the atria). This makes your heart beat very fast. Atrial flutter can come and go, or it can be permanent. The goal of treatment is to prevent blood clots from forming, control your heart rate, or get your heartbeat back into a normal rhythm. If this condition is not treated, it can cause serious problems, such as a weakened heart muscle (cardiomyopathy) or a stroke. What are the causes? This condition is often caused by conditions that damage the heart's electrical system. These include: Heart conditions and heart surgery. These include  heart attacks and open-heart surgery. Lung problems, such as COPD or a blood clot in the lung (pulmonary embolism, or PE). Poorly controlled high blood pressure (hypertension). Diabetes. Overactive thyroid (hyperthyroidism). Taking medicines to treat other heart rhythm problems (antiarrhythmic medicines). Obstructive sleep apnea. In some cases, the cause of this condition is not known. What increases the risk? This condition is more likely to develop in people who: Are older adults. Are overweight (obese). Have a family history of atrial flutter. Use alcohol or drugs, including cannabis. Smoke. What are the signs or symptoms? Symptoms of this condition include: A feeling that your heart is pounding or racing (palpitations). Shortness of breath. Chest pain. Feeling dizzy or light-headed. Fainting. Low blood pressure (hypotension). Feeling tired (fatigue). Tiring easily during exercise or activity. In some cases, there are no symptoms. How is this diagnosed? This condition may be diagnosed with: An electrocardiogram (ECG) to check the electrical signals of your heart. An ambulatory cardiac monitor to record your heart's activity for a few days. An echocardiogram. This test uses ultrasound imaging to create pictures of your heart. A transesophageal echocardiogram (TEE). This uses a flexible tube passed down your esophagus to create better pictures of your heart. A stress test to check your heart's blood supply while you exercise. Imaging tests, such as a CT scan or chest X-ray. Blood tests. How is this treated? Treatment depends on underlying conditions and how you feel when you have atrial flutter. This condition may be treated with: Medicines to prevent blood clots  or to treat heart rate or heart rhythm problems. Electrical cardioversion. This uses a jolt of electricity to reset the heart's rhythm. A procedure to remove the heart tissue that sends abnormal signals  (ablation). Left atrial appendage closure. This is a procedure to seal off a small area of the heart where blood clots can form. In some cases, underlying conditions will be treated. Follow these instructions at home: Medicines Take over-the-counter and prescription medicines only as told by your health care provider. Do not take any new medicines without talking to your provider. If you are taking blood thinners: Talk with your provider before taking aspirin or NSAIDs. These medicines can raise your risk of bleeding. Take your medicines as told. Take them at the same time each day. Do not do things that could hurt or bruise you. Be careful to avoid falls. Wear an alert bracelet or carry a card that says you take blood thinners. Lifestyle Eat heart-healthy foods. Talk with a dietitian to make an eating plan that is right for you. Do not use any products that contain nicotine or tobacco. These products include cigarettes, chewing tobacco, and vaping devices, such as e-cigarettes. If you need help quitting, ask your provider. Do not drink alcohol. Do not use drugs, including cannabis. Lose weight if you are overweight or obese. Exercise regularly as told by your provider. General instructions Do not use diet pills unless your provider approves. Diet pills may make heart problems worse. If you have obstructive sleep apnea, manage your condition as told. Keep all follow-up visits for continued treatment and for your safety. Contact a health care provider if: You notice a change in the rate, rhythm, or strength of your heartbeat. You are taking a blood thinner and you have more bruising. You have a sudden change in weight. You tire more easily when you exercise or do heavy work. Get help right away if you have: Pain or pressure in your chest. Shortness of breath. Fainting. Increasing sweating with no known cause. Side effects of blood thinners, such as blood in your vomit, stool, or pee  (urine), or bleeding that you cannot stop. Any symptoms of a stroke. "BE FAST" is an easy way to remember the main warning signs of a stroke: B - Balance. Signs are dizziness, sudden trouble walking, or loss of balance. E - Eyes. Signs are trouble seeing or a sudden change in vision. F - Face. Signs are sudden weakness or numbness of the face, or the face or eyelid drooping on one side. A - Arms. Signs are weakness or numbness in an arm. This happens suddenly and usually on one side of the body. S - Speech. Signs are sudden trouble speaking, slurred speech, or trouble understanding what people say. T - Time. Time to call emergency services. Write down what time symptoms started. Other signs of a stroke, such as: A sudden, severe headache with no known cause. Nausea or vomiting. Seizure. These symptoms may be an emergency. Get help right away. Call 911. Do not wait to see if the symptoms will go away. Do not drive yourself to the hospital. This information is not intended to replace advice given to you by your health care provider. Make sure you discuss any questions you have with your health care provider. Document Revised: 09/08/2021 Document Reviewed: 09/08/2021 Elsevier Patient Education  2024 [employer].

## 2022-09-22 ENCOUNTER — Telehealth: Payer: Self-pay

## 2022-09-22 LAB — BASIC METABOLIC PANEL
BUN/Creatinine Ratio: 17 (ref 12–28)
BUN: 18 mg/dL (ref 8–27)
CO2: 25 mmol/L (ref 20–29)
Calcium: 9.8 mg/dL (ref 8.7–10.3)
Chloride: 101 mmol/L (ref 96–106)
Creatinine, Ser: 1.04 mg/dL — ABNORMAL HIGH (ref 0.57–1.00)
Glucose: 257 mg/dL — ABNORMAL HIGH (ref 70–99)
Potassium: 4.8 mmol/L (ref 3.5–5.2)
Sodium: 141 mmol/L (ref 134–144)
eGFR: 54 mL/min/{1.73_m2} — ABNORMAL LOW (ref >59)

## 2022-09-22 LAB — CBC
Hematocrit: 46.2 % (ref 34.0–46.6)
Hemoglobin: 14.8 g/dL (ref 11.1–15.9)
MCH: 30.5 pg (ref 26.6–33.0)
MCHC: 32 g/dL (ref 31.5–35.7)
MCV: 95 fL (ref 79–97)
Platelets: 298 10*3/uL (ref 150–450)
RBC: 4.85 x10E6/uL (ref 3.77–5.28)
RDW: 12.5 % (ref 11.7–15.4)
WBC: 7.8 10*3/uL (ref 3.4–10.8)

## 2022-09-22 LAB — TSH: TSH: 2.79 u[IU]/mL (ref 0.450–4.500)

## 2022-09-22 LAB — MAGNESIUM: Magnesium: 2.2 mg/dL (ref 1.6–2.3)

## 2022-09-22 NOTE — Telephone Encounter (Signed)
Spoke with son Joni Fears per DPR and he is aware of lab results and provider recommendations. Verbalized understanding.

## 2022-09-22 NOTE — Telephone Encounter (Signed)
-----   Message from Rip Harbour sent at 09/22/2022  1:21 PM EDT ----- Please let [name] know that her CBC shows no evidence of anemia or infection. Her kidney function is stable, and her electrolytes are normal. Her thyroid function is normal as well. Continue with plan discussed yesterday, if she notices a weight gain of 2-3 lbs overnight she may take one additional dose of lasix daily as needed.

## 2022-09-29 ENCOUNTER — Telehealth (INDEPENDENT_AMBULATORY_CARE_PROVIDER_SITE_OTHER): Payer: Medicare PPO | Admitting: Endocrinology

## 2022-09-29 ENCOUNTER — Ambulatory Visit (INDEPENDENT_AMBULATORY_CARE_PROVIDER_SITE_OTHER): Payer: Medicare PPO | Admitting: Endocrinology

## 2022-09-29 DIAGNOSIS — E1165 Type 2 diabetes mellitus with hyperglycemia: Secondary | ICD-10-CM

## 2022-09-29 DIAGNOSIS — Z794 Long term (current) use of insulin: Secondary | ICD-10-CM

## 2022-09-29 DIAGNOSIS — Z7984 Long term (current) use of oral hypoglycemic drugs: Secondary | ICD-10-CM | POA: Diagnosis not present

## 2022-09-29 DIAGNOSIS — Z7985 Long-term (current) use of injectable non-insulin antidiabetic drugs: Secondary | ICD-10-CM

## 2022-09-29 DIAGNOSIS — E1140 Type 2 diabetes mellitus with diabetic neuropathy, unspecified: Secondary | ICD-10-CM | POA: Diagnosis not present

## 2022-09-29 LAB — GLUCOSE, POCT (MANUAL RESULT ENTRY): POC Glucose: 189 mg/dL — AB (ref 70–99)

## 2022-09-29 MED ORDER — TIRZEPATIDE 10 MG/0.5ML ~~LOC~~ SOAJ: 10.0000 mg | SUBCUTANEOUS | 4 refills | Status: DC

## 2022-09-29 NOTE — Telephone Encounter (Signed)
Patient presented to office requesting assistance with high blood sugars. Advises that she left a VM Monday regarding high blood sugars over the weekend - 260's. insulin did not assist in reducing her sugars over the weekend but her blood sugars are some lower now. She does have meter in hand.

## 2022-09-29 NOTE — Patient Instructions (Addendum)
Diabetes regimen - V-Go 40 insulin pump same  - For  meals breakfast, lunch and supper 5-7 click depending upon meal size. For large meal take 7 click, regular meal take 6 click and smaller meal 5 clicks.  -Increase Mounjaro to 10 mg weekly.  -Continue Farxiga 10 mg daily.

## 2022-09-29 NOTE — Telephone Encounter (Signed)
Patient is here in the office now advising are running up and down , had a dental implant on Friday and since then blood sugars have been running high she is currently taking Faxiga 10mg  daily Novolog 10 to 14 units daily before meals, Novolog 80 units max per day, Mounjaro 7.5 mg weekly , CGM has been printed off and given to Dr Erroll Luna for Advice

## 2022-09-29 NOTE — Telephone Encounter (Signed)
Checked fasting blood glucose and it states its 189 at this time

## 2022-09-29 NOTE — Progress Notes (Signed)
Outpatient Endocrinology Note Mary Melysa Schroyer, MD  10/01/22  Patient's Name: [name]    DOB: Oct 14, 1940    MRN: [id number]                                                    REASON OF VISIT: Follow up for type 2 diabetes mellitus  PCP: Georgianne Fick, MD  HISTORY OF PRESENT ILLNESS:   [name] is a 82 y.o. old female with past medical history listed below, is here for follow up of type 2 diabetes mellitus.   Pertinent Diabetes History: Patient was diagnosed with type 2 diabetes mellitus in 2011.  She was apparently intolerant to metformin and was treated with Onglyza until about 2014.  Insulin therapy was started around 2014 initially on basal insulin and later mealtime insulin was added. She has been on V-Go insulin pump since 09/2014.   Chronic Diabetes Complications : Retinopathy: no. Last ophthalmology exam was done on annually, reportedly.  Nephropathy: no, on olmesartan Peripheral neuropathy: yes, painful paresthesia, on gabapentin. Coronary artery disease: no Stroke: yes, TIA  Relevant comorbidities and cardiovascular risk factors: Obesity: yes There is no height or weight on file to calculate BMI.  Hypertension: yes Hyperlipidemia. Yes,on statin  Current / Home Diabetic regimen includes: V-Go pump 40 units basal, meal times with 10 units with breakfast, lunch and dinner ( 5 clicks with each meal). Farxiga 10 mg daily. Mounjaro 7.5 mg weekly.  Prior diabetic medications: Metformin - intolerance.  Onglyza  Glycemic data:    CONTINUOUS GLUCOSE MONITORING SYSTEM (CGMS) INTERPRETATION: At today's visit, we reviewed CGM downloads. The full report is scanned in the media. Reviewing the CGM trends, blood glucose are as follows:  FreeStyle Libre 3 CGM-  Sensor Download (Sensor download was reviewed and summarized below.) Dates: August 29 to September 29, 2018 for 14 days Sensor Average: 181  Glucose Management Indicator: 7.6% Glucose Variability:  25.6%  % data captured: 85%  Glycemic Trends:  <54: 0% 54-70: 0% 71-180: 51% 181-250: 42% 251-400: 7%  Interpretation: -Patient has variable blood sugar most of the days acceptable blood sugar with occasional hyperglycemia with blood sugar up to 275-380 range, other times mild hyperglycemia with blood sugar up to 250s range related with meals.  Rare hypoglycemia had episode of hypoglycemia on one of the days on September 5 around bedtime with blood sugar of 55.  No other episodes of hypoglycemia.  Overnight blood sugar mostly acceptable.  Hypoglycemia: Patient has no significant hypoglycemic episodes. Patient has hypoglycemia awareness.  Factors modifying glucose control: 1.  Diabetic diet assessment: three meals a day.  2.  Staying active or exercising: no formal exercise. Walks indoor.   3.  Medication compliance: compliant all of the time.  # Primary hypothyroidism : for several years, on levothyroxine 75 mcg daily,  managed by PCP.   Interval history 10/01/22 Patient presented with a concern of variable blood sugars mainly hyperglycemia, CGM data as reviewed above.  No other complaints today.  REVIEW OF SYSTEMS As per history of present illness.   PAST MEDICAL HISTORY: Past Medical History:  Diagnosis Date   Bipolar disorder (HCC)    Depression    Bipolar   GERD (gastroesophageal reflux disease)    History of hiatal hernia    History of kidney stones    History of  stomach ulcers 1970s   "bleeding"   Hyperlipidemia    Hypertension    Hypothyroidism    Ischemic colitis, enteritis, or enterocolitis (HCC)    OSA on CPAP    Pericarditis 05/06/2017   Pneumonia    "several times" (06/23/2017)   Thyroid disease    TIA (transient ischemic attack) 2011 X 2   Type II diabetes mellitus (HCC)    Urinary bladder incontinence    Vertigo     PAST SURGICAL HISTORY: Past Surgical History:  Procedure Laterality Date   APPENDECTOMY     BREAST CYST ASPIRATION Bilateral     CARDIAC CATHETERIZATION N/A 12/24/2014   Procedure: Right/Left Heart Cath and Coronary Angiography;  Surgeon: Thurmon Fair, MD;  Location: [facility];  Service: Cardiovascular;  Laterality: N/A;   CATARACT EXTRACTION W/ INTRAOCULAR LENS  IMPLANT, BILATERAL Bilateral    EXCISIONAL HEMORRHOIDECTOMY     EYE SURGERY     FRACTURE SURGERY     IR THORACENTESIS ASP PLEURAL SPACE W/IMG GUIDE  05/13/2017   LAPAROSCOPIC CHOLECYSTECTOMY     LITHOTRIPSY  "several times"   RETINAL DETACHMENT SURGERY     "think it was on the left; not sure" (06/23/2017)   TONSILLECTOMY     VAGINAL HYSTERECTOMY     partial     ALLERGIES: Allergies  Allergen Reactions   Flagyl [Metronidazole Hcl] Itching and Rash   Ciprofloxacin Itching and Rash   Metformin And Related Rash   Methimazole Rash   Milk-Related Compounds Other (See Comments)    Stomach pains   Other Other (See Comments)    [place] nuts cause severe facial redness    FAMILY HISTORY:  Family History  Problem Relation Age of Onset   Heart disease Mother    Stroke Father    Parkinson's disease Father    Cancer Sister    Cancer Brother     SOCIAL HISTORY: Social History   Socioeconomic History   Marital status: Widowed    Spouse name: Not on file   Number of children: 2   Years of education: 17   Highest education level: Not on file  Occupational History   Occupation: Retired  Tobacco Use   Smoking status: Never   Smokeless tobacco: Never  Vaping Use   Vaping status: Never Used  Substance and Sexual Activity   Alcohol use: Never   Drug use: Never   Sexual activity: Not on file  Other Topics Concern   Not on file  Social History Narrative   Lives at home alone.   Right-handed.   No caffeine use.   Social Determinants of Health   Financial Resource Strain: Not on file  Food Insecurity: Not on file  Transportation Needs: Not on file  Physical Activity: Not on file  Stress: Not on file  Social Connections: Not on file     MEDICATIONS:  Current Outpatient Medications  Medication Sig Dispense Refill   tirzepatide (MOUNJARO) 10 MG/0.5ML Pen Inject 10 mg into the skin once a week. 6 mL 4   Accu-Chek Softclix Lancets lancets Use 1-4 times daily as needed/instructed  DX E11.9 360 each 3   apixaban (ELIQUIS) 5 MG TABS tablet Take 1 tablet (5 mg total) by mouth 2 (two) times daily. 60 tablet 6   aspirin EC 81 MG EC tablet Take 1 tablet (81 mg total) by mouth daily. 30 tablet 0   Blood Glucose Monitoring Suppl (ACCU-CHEK GUIDE) w/Device KIT Use 1-4 times daily as needed/directed  DX E11.9  1 kit 1   carbamazepine (CARBATROL) 200 MG 12 hr capsule Take 200 mg by mouth 2 (two) times daily.      Cholecalciferol (QC VITAMIN D3) 50 MCG (2000 UT) TABS Take 2,000 Units by mouth daily.     clobetasol (TEMOVATE) 0.05 % GEL Apply 1 application topically 2 (two) times daily as needed (rash).      Continuous Blood Gluc Sensor (FREESTYLE LIBRE 2 SENSOR) MISC Use to check blood sugar. Change every 14 days (Patient not taking: Reported on 09/21/2022) 2 each 3   Continuous Glucose Receiver (FREESTYLE LIBRE 3 READER) DEVI Use for sensor 1 each 0   Continuous Glucose Sensor (FREESTYLE LIBRE 3 SENSOR) MISC 1 Device by Does not apply route every 14 (fourteen) days. Apply 1 sensor on upper arm every 14 days for continuous glucose monitoring 2 each 2   Continuous Glucose Sensor (FREESTYLE LIBRE 3 SENSOR) MISC 1 Device by Does not apply route every 14 (fourteen) days. Apply 1 sensor on upper arm every 14 days for continuous glucose monitoring 2 each 2   dapagliflozin propanediol (FARXIGA) 10 MG TABS tablet TAKE 1 TABLET(10 MG) BY MOUTH DAILY 90 tablet 2   diltiazem (CARDIZEM CD) 240 MG 24 hr capsule Take 1 capsule (240 mg total) by mouth daily. 90 capsule 1   donepezil (ARICEPT) 10 MG tablet Take 10 mg by mouth at bedtime.     esomeprazole (NEXIUM) 40 MG capsule Take 1 capsule (40 mg total) by mouth daily at 12 noon. 30 capsule 2   famotidine  (PEPCID) 20 MG tablet Take 1 tablet (20 mg total) by mouth 2 (two) times daily. 60 tablet 1   folic acid (FOLVITE) 1 MG tablet Take 1 mg by mouth daily.     furosemide (LASIX) 40 MG tablet TAKE 1 TABLET BY MOUTH EVERY DAY 90 tablet 1   gabapentin (NEURONTIN) 300 MG capsule Take 300 mg by mouth at bedtime.     glucose blood (ACCU-CHEK GUIDE) test strip Use 1-4 times daily as needed/instructed DX E11.9 300 each 12   insulin aspart (NOVOLOG) 100 UNIT/ML injection INJECT 80 UNITS MAX PER DAY 30 mL 2   Insulin Aspart FlexPen (NOVOLOG) 100 UNIT/ML ADMINISTER 14 TO 16 UNITS UNDER THE SKIN BEFORE MEALS AS DIRECTED 15 mL 0   Insulin Disposable Pump (V-GO 40) 40 UNIT/24HR KIT Apply 1 kit topically daily. 30 kit 5   Insulin Disposable Pump (V-GO 40) KIT USE 1 KIT DAILY AS DIRECTED 1 kit 5   Insulin Pen Needle (BD PEN NEEDLE NANO U/F) 32G X 4 MM MISC USE 8 PEN NEEDLES PER DAY 200 each 0   Insulin Pen Needle (COMFORT EZ PEN NEEDLES) 33G X 5 MM MISC 1 each by Does not apply route daily. Use with Tresiba pen 100 each 0   Insulin Pen Needle 32G X 5 MM MISC 1 each by Does not apply route daily. Use with Tresiba pen 100 each 0   levothyroxine (SYNTHROID, LEVOTHROID) 75 MCG tablet Take 75 mcg by mouth daily before breakfast.     methotrexate (RHEUMATREX) 2.5 MG tablet Take 10 mg by mouth every Monday. For rash     montelukast (SINGULAIR) 5 MG chewable tablet Chew 5 mg by mouth at bedtime.     OLANZapine (ZYPREXA) 7.5 MG tablet Take 15 mg by mouth at bedtime.     olmesartan (BENICAR) 5 MG tablet Take 5 mg by mouth daily.     ondansetron (ZOFRAN) 4 MG tablet Take 4 mg  by mouth every 8 (eight) hours as needed for nausea or vomiting.     ONETOUCH DELICA LANCETS 33G MISC USE LANCET TO TEST FIVE TIMES A DAY 200 each 0   pravastatin (PRAVACHOL) 20 MG tablet Take 20 mg by mouth daily.     QUEtiapine (SEROQUEL) 25 MG tablet Take 100 mg by mouth at bedtime.     spironolactone (ALDACTONE) 25 MG tablet TAKE 1 TABLET BY MOUTH  EVERY DAY 90 tablet 0   Vibegron (GEMTESA PO) Take 1 tablet by mouth daily in the afternoon.     No current facility-administered medications for this visit.    PHYSICAL EXAM: There were no vitals filed for this visit. There is no height or weight on file to calculate BMI.  Wt Readings from Last 3 Encounters:  09/21/22 220 lb (99.8 kg)  08/20/22 219 lb 9.6 oz (99.6 kg)  06/08/22 217 lb 6.4 oz (98.6 kg)    General: Well developed, well nourished female in no apparent distress.  HEENT: AT/[place], no external lesions.  Eyes: Conjunctiva clear and no icterus. Neck: Neck supple  Lungs: Respirations not labored Neurologic: Alert, oriented, normal speech Extremities / Skin: Dry. No sores or rashes noted.  Psychiatric: Does not appear depressed or anxious  Diabetic Foot Exam - Simple   No data filed    LABS Reviewed Lab Results  Component Value Date   HGBA1C 7.7 (H) 08/13/2022   HGBA1C 7.3 (H) 06/07/2022   HGBA1C 7.1 (H) 12/23/2021   Lab Results  Component Value Date   FRUCTOSAMINE 267 05/28/2021   FRUCTOSAMINE 223 09/16/2020   FRUCTOSAMINE 259 10/17/2017   Lab Results  Component Value Date   CHOL 149 03/26/2017   HDL 56 12/24/2019   LDLCALC 82 12/24/2019   TRIG 130 03/26/2017   CHOLHDL 3.0 03/26/2017   Lab Results  Component Value Date   MICRALBCREAT 2.2 08/13/2022   MICRALBCREAT 3.3 09/19/2020   Lab Results  Component Value Date   CREATININE 1.04 (H) 09/21/2022   Lab Results  Component Value Date   GFR 48.79 (L) 08/13/2022    ASSESSMENT / PLAN  1. Uncontrolled type 2 diabetes mellitus with hyperglycemia, with long-term current use of insulin (HCC)   2. Long term (current) use of oral hypoglycemic drugs   3. Long term current use of insulin (HCC)   4. Long-term current use of injectable noninsulin antidiabetic medication   5. Type 2 diabetes mellitus with diabetic neuropathy, with long-term current use of insulin (HCC)     Diabetes Mellitus type 2,  complicated by peripheral neuropathy. - Diabetic status / severity: Uncontrolled.  Lab Results  Component Value Date   HGBA1C 7.7 (H) 08/13/2022    - Hemoglobin A1c goal : <7.5%  - Medications:  Diabetes regimen -Continue V-Go pump 40 units basal same  - For  meals breakfast, lunch and supper 5-7 click depending upon meal size. For large meal take 7 click, regular meal take 6 click and smaller meal 5 clicks.  This will be 10 to 14 units with each meals.  -Increase Mounjaro to 10 mg weekly.  -Continue Farxiga 10 mg daily.  - Home glucose testing: Freestyle libre 3 CGM and check as needed. - Discussed/ Gave Hypoglycemia treatment plan.  # Consult : not required at this time.   # Annual urine for microalbuminuria/ creatinine ratio, no microalbuminuria currently, continue ACE/ARB /olmesartan. Last  Lab Results  Component Value Date   MICRALBCREAT 2.2 08/13/2022    # Foot check nightly /  neuropathy, continue gabapentin.  # Annual dilated diabetic eye exams.   - Diet: Make healthy diabetic food choices - Life style / activity / exercise: Discussed.  2. Blood pressure  -  BP Readings from Last 1 Encounters:  09/21/22 134/80    - Control is in target.  - No change in current plans.  3. Lipid status / Hyperlipidemia - Last  Lab Results  Component Value Date   LDLCALC 82 12/24/2019   - Continue pravastatin 20 mg daily.  Managed by primary care provider.  Diagnoses and all orders for this visit:  Uncontrolled type 2 diabetes mellitus with hyperglycemia, with long-term current use of insulin (HCC)  Long term (current) use of oral hypoglycemic drugs  Long term current use of insulin (HCC)  Long-term current use of injectable noninsulin antidiabetic medication  Type 2 diabetes mellitus with diabetic neuropathy, with long-term current use of insulin (HCC)  Other orders -     tirzepatide (MOUNJARO) 10 MG/0.5ML Pen; Inject 10 mg into the skin once a  week.    DISPOSITION Follow up in clinic in as scheduled in November.   All questions answered and patient verbalized understanding of the plan.  Mary Kaydn Kumpf, MD [facility] Endocrinology [facility] [address] [place], [address] [place], [place] [address] Phone # [phone]  At least part of this note was generated using voice recognition software. Inadvertent word errors may have occurred, which were not recognized during the proofreading process.

## 2022-09-30 ENCOUNTER — Encounter: Payer: Self-pay | Admitting: Endocrinology

## 2022-10-06 ENCOUNTER — Other Ambulatory Visit: Payer: Self-pay

## 2022-10-06 ENCOUNTER — Telehealth: Payer: Self-pay | Admitting: Nutrition

## 2022-10-06 ENCOUNTER — Encounter: Payer: Medicare PPO | Attending: Endocrinology | Admitting: Nutrition

## 2022-10-06 DIAGNOSIS — E119 Type 2 diabetes mellitus without complications: Secondary | ICD-10-CM | POA: Insufficient documentation

## 2022-10-06 DIAGNOSIS — Z713 Dietary counseling and surveillance: Secondary | ICD-10-CM | POA: Diagnosis not present

## 2022-10-06 DIAGNOSIS — E1165 Type 2 diabetes mellitus with hyperglycemia: Secondary | ICD-10-CM

## 2022-10-06 NOTE — Telephone Encounter (Signed)
[facility] does not carry these Precision Neo Test Strips I have tried to reach [name] by phone I LVM for a returned call to get her retail pharmacy information

## 2022-10-06 NOTE — Progress Notes (Signed)
Patient is here today because she was not able to set up her reader with date/time, and other settings.  She is currently wearing the [facility] 3 Plus sensor Reader was set up with date/time, alarms, target range and sound preferences.  She is wanting to use this as her meter as well. Note to Soledad Gerlach to order the PrecisionNeo test strips  from [facility] for this.  She had no final questions.

## 2022-10-06 NOTE — Patient Instructions (Signed)
Apply new sensor every 14 days Check blood sugar with fingerstick whenever you are in doubt about the reading.

## 2022-10-06 NOTE — Telephone Encounter (Signed)
Patient was started today on the Libre 3 CGM.  She is wanting to use this as her blood glucose meter as well.  Please order her the test stips for this at [facility].  They are called Libre Precision Neo test strips.  She will need 1 vial/month.  Thank you

## 2022-10-13 DIAGNOSIS — N3946 Mixed incontinence: Secondary | ICD-10-CM | POA: Diagnosis not present

## 2022-10-13 DIAGNOSIS — R350 Frequency of micturition: Secondary | ICD-10-CM | POA: Diagnosis not present

## 2022-10-15 NOTE — Progress Notes (Unsigned)
Cardiology Clinic Note   Patient Name: [name] Date of Encounter: 10/18/2022  Primary Care Provider:  Georgianne Fick, MD Primary Cardiologist:  Thurmon Fair, MD  Patient Profile    [name] 82 year old female presents to the clinic today for follow-up evaluation of her atrial flutter.  Past Medical History    Past Medical History:  Diagnosis Date   Bipolar disorder (HCC)    Depression    Bipolar   GERD (gastroesophageal reflux disease)    History of hiatal hernia    History of kidney stones    History of stomach ulcers 1970s   "bleeding"   Hyperlipidemia    Hypertension    Hypothyroidism    Ischemic colitis, enteritis, or enterocolitis (HCC)    OSA on CPAP    Pericarditis 05/06/2017   Pneumonia    "several times" (06/23/2017)   Thyroid disease    TIA (transient ischemic attack) 2011 X 2   Type II diabetes mellitus (HCC)    Urinary bladder incontinence    Vertigo    Past Surgical History:  Procedure Laterality Date   APPENDECTOMY     BREAST CYST ASPIRATION Bilateral    CARDIAC CATHETERIZATION N/A 12/24/2014   Procedure: Right/Left Heart Cath and Coronary Angiography;  Surgeon: Thurmon Fair, MD;  Location: [facility];  Service: Cardiovascular;  Laterality: N/A;   CATARACT EXTRACTION W/ INTRAOCULAR LENS  IMPLANT, BILATERAL Bilateral    EXCISIONAL HEMORRHOIDECTOMY     EYE SURGERY     FRACTURE SURGERY     IR THORACENTESIS ASP PLEURAL SPACE W/IMG GUIDE  05/13/2017   LAPAROSCOPIC CHOLECYSTECTOMY     LITHOTRIPSY  "several times"   RETINAL DETACHMENT SURGERY     "think it was on the left; not sure" (06/23/2017)   TONSILLECTOMY     VAGINAL HYSTERECTOMY     partial     Allergies  Allergies  Allergen Reactions   Flagyl [Metronidazole Hcl] Itching and Rash   Ciprofloxacin Itching and Rash   Metformin And Related Rash   Methimazole Rash   Milk-Related Compounds Other (See Comments)    Stomach pains   Other Other (See Comments)     [place] nuts cause severe facial redness    History of Present Illness    [name] has a PMH of HTN, angina, pericardial effusion, OSA, bilateral pleural effusion, community-acquired pneumonia, type 2 diabetes, AKI, hyperlipidemia, obesity, syncope, atypical chest pain, abdominal pain, TIA, and atrial flutter.  She was noted to have acute pericarditis in 2019.  She was also noted to have previous episodes of intermittent shortness of breath and lower extremity swelling which responded well to diuresis.  She did receive a course of metolazone and loop diuretics which caused significant hypovolemia and acute kidney injury.  Echocardiogram showed hyperdynamic LV function and no significant valvular abnormalities 5/22.  Her cardiac MRI 4/19 showed normal LV function, mild LVH, and no evidence of LGE.  She was seen in follow-up by Dr. Royann Shivers 12/23.  At that time she felt well.  She had been started on Mounjaro.  She had not started losing weight.  She denied PND and orthopnea.  She denied shortness of breath and continued with chronic mild ankle edema.  She denied chest pain and syncope.  She followed up with KatlynWest NP on 09/21/2022.  She had reported weight gain and lower extremity swelling.  She reported that she was doing well and had significant improvement with her lower extremity edema.  However,  she was not feeling back to normal.  She did note some mild dyspnea on exertion.  She denied chest pain and orthopnea.  She did note palpitations and noted an increased amount of palpitations over the last few months.  She was noted to be in atrial flutter.  She was started on Eliquis and recommendations for DCCV were reviewed.  She presents to the clinic today for follow-up evaluation and states she continues to have fatigue x 2-3 months with walking.  She reports compliance with her apixaban.  She does note that she has had bright red blood in her stools for several weeks.  She has not brought  this to the attention of any healthcare provider.  Her blood pressure initially today is 142/74 and on recheck is 136/73.  I will repeat a CBC, instructed her to follow-up with her PCP this week and we will plan follow-up in 1-2 months.  I will also increase her olmesartan to 7.5 mg daily.  Today she denies chest pain, shortness of breath, lower extremity edema,  palpitations,  diaphoresis, weakness, presyncope, syncope, orthopnea, and PND.    Home Medications    Prior to Admission medications   Medication Sig Start Date End Date Taking? Authorizing Provider  Accu-Chek Softclix Lancets lancets Use 1-4 times daily as needed/instructed  DX E11.9 10/27/19   Reather Littler, MD  apixaban (ELIQUIS) 5 MG TABS tablet Take 1 tablet (5 mg total) by mouth 2 (two) times daily. 09/21/22   Reather Littler D, NP  aspirin EC 81 MG EC tablet Take 1 tablet (81 mg total) by mouth daily. 05/18/17   Burnadette Pop, MD  Blood Glucose Monitoring Suppl (ACCU-CHEK GUIDE) w/Device KIT Use 1-4 times daily as needed/directed  DX E11.9 10/27/19   Reather Littler, MD  carbamazepine (CARBATROL) 200 MG 12 hr capsule Take 200 mg by mouth 2 (two) times daily.     [provider]  Cholecalciferol (QC VITAMIN D3) 50 MCG (2000 UT) TABS Take 2,000 Units by mouth daily.    [provider]  clobetasol (TEMOVATE) 0.05 % GEL Apply 1 application topically 2 (two) times daily as needed (rash).     [provider]  Continuous Blood Gluc Sensor (FREESTYLE LIBRE 2 SENSOR) MISC Use to check blood sugar. Change every 14 days Patient not taking: Reported on 09/21/2022 09/17/20   Reather Littler, MD  Continuous Glucose Receiver (FREESTYLE LIBRE 3 READER) DEVI Use for sensor 08/20/22   Reather Littler, MD  Continuous Glucose Sensor (FREESTYLE LIBRE 3 SENSOR) MISC 1 Device by Does not apply route every 14 (fourteen) days. Apply 1 sensor on upper arm every 14 days for continuous glucose monitoring 06/08/22   Reather Littler, MD  Continuous Glucose Sensor  (FREESTYLE LIBRE 3 SENSOR) MISC 1 Device by Does not apply route every 14 (fourteen) days. Apply 1 sensor on upper arm every 14 days for continuous glucose monitoring 08/20/22   Reather Littler, MD  dapagliflozin propanediol (FARXIGA) 10 MG TABS tablet TAKE 1 TABLET(10 MG) BY MOUTH DAILY 07/15/22   Reather Littler, MD  diltiazem (CARDIZEM CD) 240 MG 24 hr capsule Take 1 capsule (240 mg total) by mouth daily. 08/02/22   Croitoru, Mihai, MD  donepezil (ARICEPT) 10 MG tablet Take 10 mg by mouth at bedtime. 01/22/19   [provider]  esomeprazole (NEXIUM) 40 MG capsule Take 1 capsule (40 mg total) by mouth daily at 12 noon. 06/27/17   Oretha Milch, MD  famotidine (PEPCID) 20 MG tablet Take 1  tablet (20 mg total) by mouth 2 (two) times daily. 01/27/21   Croitoru, Mihai, MD  folic acid (FOLVITE) 1 MG tablet Take 1 mg by mouth daily.    [provider]  furosemide (LASIX) 40 MG tablet TAKE 1 TABLET BY MOUTH EVERY DAY 08/19/21   Croitoru, Mihai, MD  gabapentin (NEURONTIN) 300 MG capsule Take 300 mg by mouth at bedtime.    [provider]  glucose blood (ACCU-CHEK GUIDE) test strip Use 1-4 times daily as needed/instructed DX E11.9 10/27/19   Reather Littler, MD  insulin aspart (NOVOLOG) 100 UNIT/ML injection INJECT 80 UNITS MAX PER DAY 09/16/22   Reather Littler, MD  Insulin Aspart FlexPen (NOVOLOG) 100 UNIT/ML ADMINISTER 14 TO 16 UNITS UNDER THE SKIN BEFORE MEALS AS DIRECTED 08/09/22   Reather Littler, MD  Insulin Disposable Pump (V-GO 40) 40 UNIT/24HR KIT Apply 1 kit topically daily. 11/03/21   Reather Littler, MD  Insulin Disposable Pump (V-GO 40) KIT USE 1 KIT DAILY AS DIRECTED 05/28/20   Reather Littler, MD  Insulin Pen Needle (BD PEN NEEDLE NANO U/F) 32G X 4 MM MISC USE 8 PEN NEEDLES PER DAY 09/18/18   Reather Littler, MD  Insulin Pen Needle (COMFORT EZ PEN NEEDLES) 33G X 5 MM MISC 1 each by Does not apply route daily. Use with Evaristo Bury pen 09/23/17   Reather Littler, MD  Insulin Pen Needle 32G X 5 MM MISC 1 each by Does not  apply route daily. Use with Evaristo Bury pen 09/27/17   Reather Littler, MD  levothyroxine (SYNTHROID, LEVOTHROID) 75 MCG tablet Take 75 mcg by mouth daily before breakfast.    [provider]  methotrexate (RHEUMATREX) 2.5 MG tablet Take 10 mg by mouth every Monday. For rash    [provider]  montelukast (SINGULAIR) 5 MG chewable tablet Chew 5 mg by mouth at bedtime.    [provider]  OLANZapine (ZYPREXA) 7.5 MG tablet Take 15 mg by mouth at bedtime.    [provider]  olmesartan (BENICAR) 5 MG tablet Take 5 mg by mouth daily.    [provider]  ondansetron (ZOFRAN) 4 MG tablet Take 4 mg by mouth every 8 (eight) hours as needed for nausea or vomiting.    [provider]  Dola Argyle LANCETS 33G MISC USE LANCET TO TEST FIVE TIMES A DAY 05/31/17   Reather Littler, MD  pravastatin (PRAVACHOL) 20 MG tablet Take 20 mg by mouth daily. 07/14/20   [provider]  QUEtiapine (SEROQUEL) 25 MG tablet Take 100 mg by mouth at bedtime. 12/29/18   [provider]  spironolactone (ALDACTONE) 25 MG tablet TAKE 1 TABLET BY MOUTH EVERY DAY 10/19/21   Croitoru, Mihai, MD  tirzepatide [facility]) 10 MG/0.5ML Pen Inject 10 mg into the skin once a week. 09/29/22   Thapa, [place], MD  Vibegron (GEMTESA PO) Take 1 tablet by mouth daily in the afternoon.    [provider]    Family History    Family History  Problem Relation Age of Onset   Heart disease Mother    Stroke Father    Parkinson's disease Father    Cancer Sister    Cancer Brother    She indicated that her mother is alive. She indicated that her father is deceased. She indicated that her sister is alive. She indicated that the status of her brother is unknown.  Social History    Social History   Socioeconomic History   Marital status: Widowed  Spouse name: Not on file   Number of children: 2   Years of education: 60   Highest education level: Not on file  Occupational  History   Occupation: Retired  Tobacco Use   Smoking status: Never   Smokeless tobacco: Never  Vaping Use   Vaping status: Never Used  Substance and Sexual Activity   Alcohol use: Never   Drug use: Never   Sexual activity: Not on file  Other Topics Concern   Not on file  Social History Narrative   Lives at home alone.   Right-handed.   No caffeine use.   Social Determinants of Health   Financial Resource Strain: Not on file  Food Insecurity: Not on file  Transportation Needs: Not on file  Physical Activity: Not on file  Stress: Not on file  Social Connections: Not on file  Intimate Partner Violence: Not on file     Review of Systems    General:  No chills, fever, night sweats or weight changes.  Cardiovascular:  No chest pain, dyspnea on exertion, edema, orthopnea, palpitations, paroxysmal nocturnal dyspnea. Dermatological: No rash, lesions/masses Respiratory: No cough, dyspnea Urologic: No hematuria, dysuria Abdominal:   No nausea, vomiting, diarrhea, bright red blood per rectum, melena, or hematemesis Neurologic:  No visual changes, wkns, changes in mental status. All other systems reviewed and are otherwise negative except as noted above.  Physical Exam    VS:  BP 136/72   Pulse 95   Ht 5\' 3"  (1.6 m)   Wt 224 lb 12.8 oz (102 kg)   SpO2 95%   BMI 39.82 kg/m  , BMI Body mass index is 39.82 kg/m. GEN: Well nourished, well developed, in no acute distress. HEENT: normal. Neck: Supple, no JVD, carotid bruits, or masses. Cardiac: RRR, no murmurs, rubs, or gallops. No clubbing, cyanosis, edema.  Radials/DP/PT 2+ and equal bilaterally.  Respiratory:  Respirations regular and unlabored, clear to auscultation bilaterally. GI: Soft, nontender, nondistended, BS + x 4. MS: no deformity or atrophy. Skin: warm and dry, no rash. Neuro:  Strength and sensation are intact. Psych: Normal affect.  Accessory Clinical Findings    Recent Labs: 09/21/2022: BUN 18; Creatinine,  Ser 1.04; Hemoglobin 14.8; Magnesium 2.2; Platelets 298; Potassium 4.8; Sodium 141; TSH 2.790   Recent Lipid Panel    Component Value Date/Time   CHOL 149 03/26/2017 1710   TRIG 130 03/26/2017 1710   HDL 56 12/24/2019 0000   CHOLHDL 3.0 03/26/2017 1710   VLDL 26 03/26/2017 1710   LDLCALC 82 12/24/2019 0000         ECG personally reviewed by me today- EKG Interpretation Date/Time:  Monday October 18 2022 14:05:44 EDT Ventricular Rate:  92 PR Interval:  180 QRS Duration:  80 QT Interval:  338 QTC Calculation: 417 R Axis:   -74  Text Interpretation: Normal sinus rhythm Left axis deviation Inferior infarct , age undetermined Anterior infarct , age undetermined When compared with ECG of 21-Sep-2022 14:34, Sinus rhythm has replaced Atrial flutter Inferior infarct is now Present T wave inversion less evident in Lateral leads Confirmed by Edd Fabian [phone]) on 10/18/2022 2:07:34 PM    Echocardiogram 05/20/2020  IMPRESSIONS     1. Left ventricular ejection fraction, by estimation, is 70 to 75%. The  left ventricle has hyperdynamic function. The left ventricle has no  regional wall motion abnormalities. There is moderate concentric left  ventricular hypertrophy. Left ventricular  diastolic parameters are consistent with Grade I diastolic dysfunction  (impaired  relaxation).   2. Right ventricular systolic function is normal. The right ventricular  size is normal. Tricuspid regurgitation signal is inadequate for assessing  PA pressure.   3. The mitral valve is degenerative. Trivial mitral valve regurgitation.  No evidence of mitral stenosis.   4. The aortic valve is tricuspid. There is mild calcification of the  aortic valve. Aortic valve regurgitation is not visualized. No aortic  stenosis is present.   Comparison(s): No significant change from prior study.   FINDINGS   Left Ventricle: Left ventricular ejection fraction, by estimation, is 70  to 75%. The left ventricle has  hyperdynamic function. The left ventricle  has no regional wall motion abnormalities. The left ventricular internal  cavity size was normal in size.  There is moderate concentric left ventricular hypertrophy. Left  ventricular diastolic parameters are consistent with Grade I diastolic  dysfunction (impaired relaxation).   Right Ventricle: The right ventricular size is normal. No increase in  right ventricular wall thickness. Right ventricular systolic function is  normal. Tricuspid regurgitation signal is inadequate for assessing PA  pressure.   Left Atrium: Left atrial size was normal in size.   Right Atrium: Right atrial size was normal in size.   Pericardium: Trivial pericardial effusion is present. Presence of  pericardial fat pad.   Mitral Valve: The mitral valve is degenerative in appearance. Mild mitral  annular calcification. Trivial mitral valve regurgitation. No evidence of  mitral valve stenosis.   Tricuspid Valve: The tricuspid valve is grossly normal. Tricuspid valve  regurgitation is not demonstrated. No evidence of tricuspid stenosis.   Aortic Valve: The aortic valve is tricuspid. There is mild calcification  of the aortic valve. Aortic valve regurgitation is not visualized. No  aortic stenosis is present.   Pulmonic Valve: The pulmonic valve was grossly normal. Pulmonic valve  regurgitation is not visualized. No evidence of pulmonic stenosis.   Aorta: The aortic root and ascending aorta are structurally normal, with  no evidence of dilitation.   Venous: The inferior vena cava was not well visualized.   IAS/Shunts: The atrial septum is grossly normal.          Assessment & Plan   1.  Atrial flutter-EKG today shows sinus rhythm.  Reports compliance with apixaban.  Denies bleeding issues. This patients CHA2DS2-VASc Score and unadjusted Ischemic Stroke Rate (% per year) is equal to 9.7 % stroke rate/year from a score of 6 Above score calculated as 1 point  each if present [CHF, HTN, DM, Female,Age > 75, ]-6 Continue Cardizem, apixaban Order CBC  Blood in stool-reports several weeks of bright red blood in stool.  Will anticipate discontinuation of apixaban based on lab results. Follow-up with PCP Order CBC  Chronic diastolic CHF-weight today 224.  Previous and noted to be 220 pounds. Continue furosemide-may take an extra dose of furosemide for weight increase of 2 to 3 pounds overnight or 5 pounds in 1 week. Continue spironolactone, Farxiga, olmesartan Heart healthy low-sodium diet Elevate lower extremities when not active Maintain weight log  Hyperlipidemia-LDL 46 on 6/24. Continue current medical therapy High-fiber diet Increase physical activity as tolerated  Essential hypertension-BP today 136/72 . Continue spironolactone, Cardizem,  Increase olmesartan to 7.5 mg daily Maintain blood pressure log Low-sodium diet  Disposition: Follow-up with Dr. Royann Shivers or me in 1 months.   Thomasene Ripple. Marypat Kimmet NP-C     10/18/2022, 4:49 PM [facility] [address] Office [phone] Fax [phone]    I  spent 13 minutes examining this patient, reviewing medications, and using patient centered shared decision making involving her cardiac care.  Prior to her visit I spent greater than 20 minutes reviewing her past medical history,  medications, and prior cardiac tests.

## 2022-10-18 ENCOUNTER — Ambulatory Visit: Payer: Medicare PPO | Attending: General Practice | Admitting: General Practice

## 2022-10-18 ENCOUNTER — Other Ambulatory Visit: Payer: Self-pay

## 2022-10-18 ENCOUNTER — Encounter: Payer: Self-pay | Admitting: General Practice

## 2022-10-18 VITALS — BP 136/72 | HR 95 | Ht 63.0 in | Wt 224.8 lb

## 2022-10-18 DIAGNOSIS — I5032 Chronic diastolic (congestive) heart failure: Secondary | ICD-10-CM | POA: Diagnosis not present

## 2022-10-18 DIAGNOSIS — K921 Melena: Secondary | ICD-10-CM | POA: Diagnosis not present

## 2022-10-18 DIAGNOSIS — I4892 Unspecified atrial flutter: Secondary | ICD-10-CM | POA: Diagnosis not present

## 2022-10-18 DIAGNOSIS — I10 Essential (primary) hypertension: Secondary | ICD-10-CM

## 2022-10-18 DIAGNOSIS — Z794 Long term (current) use of insulin: Secondary | ICD-10-CM

## 2022-10-18 DIAGNOSIS — E7800 Pure hypercholesterolemia, unspecified: Secondary | ICD-10-CM

## 2022-10-18 DIAGNOSIS — E1165 Type 2 diabetes mellitus with hyperglycemia: Secondary | ICD-10-CM

## 2022-10-18 MED ORDER — FREESTYLE LIBRE 2 SENSOR MISC: 3 refills | Status: DC

## 2022-10-18 MED ORDER — V-GO 40 40 UNIT/24HR KIT: 1.0000 | PACK | Freq: Every day | 5 refills | Status: DC

## 2022-10-18 MED ORDER — COMFORT EZ PEN NEEDLES 33G X 5 MM MISC
1.0000 | Freq: Every day | 0 refills | Status: DC
Start: 2022-10-18 — End: 2023-09-06

## 2022-10-18 MED ORDER — DAPAGLIFLOZIN PROPANEDIOL 10 MG PO TABS
ORAL_TABLET | ORAL | 2 refills | Status: DC
Start: 2022-10-18 — End: 2023-06-10

## 2022-10-18 MED ORDER — OLMESARTAN MEDOXOMIL 5 MG PO TABS: 7.5000 mg | ORAL_TABLET | Freq: Every day | ORAL | Status: DC

## 2022-10-18 MED ORDER — ACCU-CHEK GUIDE W/DEVICE KIT: PACK | 1 refills | Status: DC

## 2022-10-18 MED ORDER — ACCU-CHEK GUIDE VI STRP: ORAL_STRIP | 12 refills | Status: DC

## 2022-10-18 MED ORDER — BD PEN NEEDLE NANO U/F 32G X 4 MM MISC: 0 refills | Status: DC

## 2022-10-18 MED ORDER — ONETOUCH DELICA LANCETS 33G MISC: 0 refills | Status: DC

## 2022-10-18 MED ORDER — INSULIN ASPART 100 UNIT/ML IJ SOLN: INTRAMUSCULAR | 2 refills | Status: DC

## 2022-10-18 MED ORDER — ACCU-CHEK SOFTCLIX LANCETS MISC: 3 refills | Status: DC

## 2022-10-18 NOTE — Telephone Encounter (Signed)
Patient needs all meds previously prescribed by Lucianne Muss sent over with Thapa as prescriber per VM left.

## 2022-10-18 NOTE — Patient Instructions (Addendum)
Medication Instructions:  INCREASE OLMESARTAN 7.5 MG DAILY *If you need a refill on your cardiac medications before your next appointment, please call your pharmacy*  Lab Work: CBC TODAY If you have labs (blood work) drawn today and your tests are completely normal, you will receive your results only by:   MyChart Message (if you have MyChart) OR  A paper copy in the mail If you have any lab test that is abnormal or we need to change your treatment, we will call you to review the results.  Other Instructions IF YOU STILL HAVE BLOOD IN YOUR STOOL NEXT WEEK CALL FOR FURTHER DIRECTION-DISCUSS POSITIVE RESULT MAKE SURE TO KEEP HYDRATED  Please try to avoid these triggers: Do not use any products that have nicotine or tobacco in them. These include cigarettes, e-cigarettes, and chewing tobacco. If you need help quitting, ask your doctor. Eat heart-healthy foods. Talk with your doctor about the right eating plan for you. Exercise regularly as told by your doctor. Stay hydrated Do not drink alcohol, Caffeine or chocolate. Lose weight if you are overweight. Do not use drugs, including cannabis  Follow-Up: At [facility], you and your health needs are our priority.  As part of our continuing mission to provide you with exceptional heart care, we have created designated Provider Care Teams.  These Care Teams include your primary Cardiologist (physician) and Advanced Practice Providers (APPs -  Physician Assistants and Nurse Practitioners) who all work together to provide you with the care you need, when you need it.  Your next appointment:   1-2 month(s)  Provider:   Thurmon Fair, MD  or Edd Fabian, FNP

## 2022-10-19 LAB — CBC
Hematocrit: 43 % (ref 34.0–46.6)
Hemoglobin: 14.4 g/dL (ref 11.1–15.9)
MCH: 30.5 pg (ref 26.6–33.0)
MCHC: 33.5 g/dL (ref 31.5–35.7)
MCV: 91 fL (ref 79–97)
Platelets: 314 10*3/uL (ref 150–450)
RBC: 4.72 x10E6/uL (ref 3.77–5.28)
RDW: 12.6 % (ref 11.7–15.4)
WBC: 6.8 10*3/uL (ref 3.4–10.8)

## 2022-10-20 ENCOUNTER — Telehealth: Payer: Self-pay | Admitting: Cardiovascular Disease

## 2022-10-20 ENCOUNTER — Other Ambulatory Visit: Payer: Self-pay | Admitting: Urology

## 2022-10-20 NOTE — Telephone Encounter (Signed)
   Pre-operative Risk Assessment    Patient Name: [name]  DOB: 1940/01/24 MRN: [id number]      Request for Surgical Clearance    Procedure:   Cysto with Botox  Date of Surgery:  Clearance 11/23/22                                 Surgeon: Dr. Sherron Monday Surgeon's Group or Practice Name: [facility] Phone number: [phone] Fax number: [phone]   Type of Clearance Requested:   - Medical  - Pharmacy:  Hold Apixaban (Eliquis) 2 days    Type of Anesthesia:  General    Additional requests/questions:   No  Barbette Reichmann   10/20/2022, 10:49 AM

## 2022-10-20 NOTE — Telephone Encounter (Signed)
Left message for pt that she can hold Eliquis x 1 day is the preference of the cardiologist and pre op APP. However, if ok to hold x 2 days prior if needed per the surgeon. Pt will need to resume Eliquis ASAP after procedure due to CV risk, see notes.

## 2022-10-20 NOTE — Telephone Encounter (Signed)
Pharmacy please advise on holding Eliquis prior to cystoscopy with Botox scheduled for 11/23/2022. Thank you.      Good Morning [name]  We have received a surgical clearance request for [name]. They were seen recently in clinic on 10/18/2022. Can you please comment on surgical clearance.  Please forward you guidance and recommendations to P CV DIV PREOP

## 2022-10-20 NOTE — Telephone Encounter (Signed)
   Patient Name: [name]  DOB: Oct 13, 1940 MRN: [id number]  Primary Cardiologist: Thurmon Fair, MD  Chart reviewed as part of pre-operative protocol coverage. Given past medical history and time since last visit, based on ACC/AHA guidelines, [name] is at acceptable risk for the planned procedure without further cardiovascular testing.   The patient was advised that if she develops new symptoms prior to surgery to contact our office to arrange for a follow-up visit, and she verbalized understanding.  Would prefer 1 day Eliquis hold prior to procedure given elevated CV risk. If 2 day hold is necessary, pt needs to resume as soon as safely possible after.   I will route this recommendation to the requesting party via Epic fax function and remove from pre-op pool.  Please call with questions.  Napoleon Form, Leodis Rains, NP 10/20/2022, 1:44 PM

## 2022-10-20 NOTE — Telephone Encounter (Signed)
Will fax notes to requesting office to see clearance notes from pre op APP.

## 2022-10-20 NOTE — Telephone Encounter (Signed)
Patient with diagnosis of aflutter on Eliquis for anticoagulation.    Procedure: cystoscopy with botox Date of procedure: 11/23/22  CHA2DS2-VASc Score = 9  This indicates a 12.2% annual risk of stroke. The patient's score is based upon: CHF History: 1 HTN History: 1 Diabetes History: 1 Stroke History: 2 Vascular Disease History: 1 Age Score: 2 Gender Score: 1   TIA in 2011  CrCl 47mL/min using adjusted body weight (1mL/min using actual body weight) Platelet count 314K  Would prefer 1 day Eliquis hold prior to procedure given elevated CV risk. If 2 day hold is necessary, pt needs to resume as soon as safely possible after.  **This guidance is not considered finalized until pre-operative APP has relayed final recommendations.**

## 2022-10-21 DIAGNOSIS — Z23 Encounter for immunization: Secondary | ICD-10-CM | POA: Diagnosis not present

## 2022-10-21 DIAGNOSIS — I10 Essential (primary) hypertension: Secondary | ICD-10-CM | POA: Diagnosis not present

## 2022-10-21 DIAGNOSIS — I129 Hypertensive chronic kidney disease with stage 1 through stage 4 chronic kidney disease, or unspecified chronic kidney disease: Secondary | ICD-10-CM | POA: Diagnosis not present

## 2022-10-25 NOTE — Telephone Encounter (Signed)
I called the pt and left message that Dr. Sherron Monday replied he is ok with the recommendation from the cardiologist and pharm-d to hold Eliquis x 1 days prior to procedure.

## 2022-10-26 ENCOUNTER — Other Ambulatory Visit: Payer: Self-pay

## 2022-10-26 DIAGNOSIS — Z794 Long term (current) use of insulin: Secondary | ICD-10-CM

## 2022-10-26 MED ORDER — ACCU-CHEK GUIDE VI STRP: ORAL_STRIP | 12 refills | Status: DC

## 2022-11-10 NOTE — Patient Instructions (Signed)
DUE TO COVID-19 ONLY TWO VISITORS  (aged 81 and older)  ARE ALLOWED TO COME WITH YOU AND STAY IN THE WAITING ROOM ONLY DURING PRE OP AND PROCEDURE.   **NO VISITORS ARE ALLOWED IN THE SHORT STAY AREA OR RECOVERY ROOM!!**  IF YOU WILL BE ADMITTED INTO THE HOSPITAL YOU ARE ALLOWED ONLY FOUR SUPPORT PEOPLE DURING VISITATION HOURS ONLY (7 AM -8PM)   The support person(s) must pass our screening, gel in and out, and wear a mask at all times, including in the patient's room. Patients must also wear a mask when staff or their support person are in the room. Visitors GUEST BADGE MUST BE WORN VISIBLY  One adult visitor may remain with you overnight and MUST be in the room by 8 P.M.     Your procedure is scheduled on: 11/23/22   Report to [facility] Main Entrance    Report to admitting at : 8:30 AM   Call this number if you have problems the morning of surgery [phone]   Do not eat food :After Midnight.   After Midnight you may have the following liquids until : 7:30 AM DAY OF SURGERY  Water Black Coffee (sugar ok, NO MILK/CREAM OR CREAMERS)  Tea (sugar ok, NO MILK/CREAM OR CREAMERS) regular and decaf                             Plain Jell-O (NO RED)                                           Fruit ices (not with fruit pulp, NO RED)                                     Popsicles (NO RED)                                                                  Juice: apple, WHITE grape, WHITE cranberry Sports drinks like Gatorade (NO RED)              FOLLOW ANY ADDITIONAL PRE OP INSTRUCTIONS YOU RECEIVED FROM YOUR SURGEON'S OFFICE!!!   Oral Hygiene is also important to reduce your risk of infection.                                    Remember - BRUSH YOUR TEETH THE MORNING OF SURGERY WITH YOUR REGULAR TOOTHPASTE  DENTURES WILL BE REMOVED PRIOR TO SURGERY PLEASE DO NOT APPLY "Poly grip" OR ADHESIVES!!!   Do NOT smoke after Midnight   Take these medicines the morning of surgery with A  SIP OF WATER: carbamazepine,gabapentin,diltiazem,levothyroxine,esomeprazole,vibegron,famotidine. How to Manage Your Diabetes Before and After Surgery  Why is it important to control my blood sugar before and after surgery? Improving blood sugar levels before and after surgery helps healing and can limit problems. A way of improving blood sugar control is eating a healthy diet by:  Eating less sugar and  carbohydrates  Increasing activity/exercise  Talking with your doctor about reaching your blood sugar goals High blood sugars (greater than 180 mg/dL) can raise your risk of infections and slow your recovery, so you will need to focus on controlling your diabetes during the weeks before surgery. Make sure that the doctor who takes care of your diabetes knows about your planned surgery including the date and location.  How do I manage my blood sugar before surgery? Check your blood sugar at least 4 times a day, starting 2 days before surgery, to make sure that the level is not too high or low. Check your blood sugar the morning of your surgery when you wake up and every 2 hours until you get to the Short Stay unit. If your blood sugar is less than 70 mg/dL, you will need to treat for low blood sugar: Do not take insulin. Treat a low blood sugar (less than 70 mg/dL) with  cup of clear juice (cranberry or apple), 4 glucose tablets, OR glucose gel. Recheck blood sugar in 15 minutes after treatment (to make sure it is greater than 70 mg/dL). If your blood sugar is not greater than 70 mg/dL on recheck, call [phone] for further instructions. Report your blood sugar to the short stay nurse when you get to Short Stay.  If you are admitted to the hospital after surgery: Your blood sugar will be checked by the staff and you will probably be given insulin after surgery (instead of oral diabetes medicines) to make sure you have good blood sugar levels. The goal for blood sugar control after surgery is  80-180 mg/dL.   WHAT DO I DO ABOUT MY DIABETES MEDICATION?  HOLD farxiga after: 11/19/22  THE NIGHT BEFORE SURGERY, take ONLY half of novolog insulin dose.     THE MORNING OF SURGERY, DO NOT TAKE ANY ORAL DIABETIC MEDICATIONS DAY OF YOUR SURGERY.If your CBG is greater than 220 mg/dL, you may take  of your sliding scale  (correction) dose of insulin.  DO NOT TAKE THE FOLLOWING 7 DAYS PRIOR TO SURGERY: Ozempic, Wegovy, Rybelsus (Semaglutide), Byetta (exenatide), Bydureon (exenatide ER), Victoza, Saxenda (liraglutide), or Trulicity (dulaglutide) Mounjaro (Tirzepatide) Adlyxin (Lixisenatide), Polyethylene Glycol Loxenatide. HOLD mounjaro after: 11/15/22.                              You may not have any metal on your body including hair pins, jewelry, and body piercing             Do not wear make-up, lotions, powders, perfumes/cologne, or deodorant  Do not wear nail polish including gel and S&S, artificial/acrylic nails, or any other type of covering on natural nails including finger and toenails. If you have artificial nails, gel coating, etc. that needs to be removed by a nail salon please have this removed prior to surgery or surgery may need to be canceled/ delayed if the surgeon/ anesthesia feels like they are unable to be safely monitored.   Do not shave  48 hours prior to surgery.    Do not bring valuables to the hospital. [facility] IS NOT             RESPONSIBLE   FOR VALUABLES.   Contacts, glasses, or bridgework may not be worn into surgery.   Bring small overnight bag day of surgery.   DO NOT BRING YOUR HOME MEDICATIONS TO THE HOSPITAL. PHARMACY WILL DISPENSE MEDICATIONS LISTED ON YOUR MEDICATION  LIST TO YOU DURING YOUR ADMISSION IN THE HOSPITAL!    Patients discharged on the day of surgery will not be allowed to drive home.  Someone NEEDS to stay with you for the first 24 hours after anesthesia.   Special Instructions: Bring a copy of your healthcare power of attorney and  living will documents         the day of surgery if you haven't scanned them before.              Please read over the following fact sheets you were given: IF YOU HAVE QUESTIONS ABOUT YOUR PRE-OP INSTRUCTIONS PLEASE CALL [phone]    [facility] - Preparing for Surgery Before surgery, you can play an important role.  Because skin is not sterile, your skin needs to be as free of germs as possible.  You can reduce the number of germs on your skin by washing with CHG (chlorahexidine gluconate) soap before surgery.  CHG is an antiseptic cleaner which kills germs and bonds with the skin to continue killing germs even after washing. Please DO NOT use if you have an allergy to CHG or antibacterial soaps.  If your skin becomes reddened/irritated stop using the CHG and inform your nurse when you arrive at Short Stay. Do not shave (including legs and underarms) for at least 48 hours prior to the first CHG shower.  You may shave your face/neck. Please follow these instructions carefully:  1.  Shower with CHG Soap the night before surgery and the  morning of Surgery.  2.  If you choose to wash your hair, wash your hair first as usual with your  normal  shampoo.  3.  After you shampoo, rinse your hair and body thoroughly to remove the  shampoo.                           4.  Use CHG as you would any other liquid soap.  You can apply chg directly  to the skin and wash                       Gently with a scrungie or clean washcloth.  5.  Apply the CHG Soap to your body ONLY FROM THE NECK DOWN.   Do not use on face/ open                           Wound or open sores. Avoid contact with eyes, ears mouth and genitals (private parts).                       Wash face,  Genitals (private parts) with your normal soap.             6.  Wash thoroughly, paying special attention to the area where your surgery  will be performed.  7.  Thoroughly rinse your body with warm water from the neck down.  8.  DO NOT shower/wash  with your normal soap after using and rinsing off  the CHG Soap.                9.  Pat yourself dry with a clean towel.            10.  Wear clean pajamas.            11.  Place clean sheets on your bed the  night of your first shower and do not  sleep with pets. Day of Surgery : Do not apply any lotions/deodorants the morning of surgery.  Please wear clean clothes to the hospital/surgery center.  FAILURE TO FOLLOW THESE INSTRUCTIONS MAY RESULT IN THE CANCELLATION OF YOUR SURGERY PATIENT SIGNATURE_________________________________  NURSE SIGNATURE__________________________________  ________________________________________________________________________

## 2022-11-11 ENCOUNTER — Other Ambulatory Visit: Payer: Self-pay

## 2022-11-11 ENCOUNTER — Encounter (HOSPITAL_COMMUNITY)
Admission: RE | Admit: 2022-11-11 | Discharge: 2022-11-11 | Disposition: A | Discharge status: Home / Self Care | Payer: Medicare PPO | Source: Ambulatory Visit | Attending: Family Medicine | Admitting: Family Medicine

## 2022-11-11 ENCOUNTER — Encounter (HOSPITAL_COMMUNITY): Payer: Self-pay

## 2022-11-11 VITALS — BP 152/70 | HR 79 | Temp 98.9°F | Ht 63.0 in | Wt 222.0 lb

## 2022-11-11 DIAGNOSIS — E119 Type 2 diabetes mellitus without complications: Secondary | ICD-10-CM | POA: Insufficient documentation

## 2022-11-11 DIAGNOSIS — Z01818 Encounter for other preprocedural examination: Secondary | ICD-10-CM | POA: Diagnosis present

## 2022-11-11 DIAGNOSIS — I10 Essential (primary) hypertension: Secondary | ICD-10-CM | POA: Insufficient documentation

## 2022-11-11 DIAGNOSIS — Z01812 Encounter for preprocedural laboratory examination: Secondary | ICD-10-CM | POA: Diagnosis not present

## 2022-11-11 HISTORY — DX: Cardiac arrhythmia, unspecified: I49.9

## 2022-11-11 HISTORY — DX: Heart failure, unspecified: I50.9

## 2022-11-11 LAB — CBC
HCT: 43.5 % (ref 36.0–46.0)
Hemoglobin: 14.2 g/dL (ref 12.0–15.0)
MCH: 30.1 pg (ref 26.0–34.0)
MCHC: 32.6 g/dL (ref 30.0–36.0)
MCV: 92.4 fL (ref 80.0–100.0)
Platelets: 345 10*3/uL (ref 150–400)
RBC: 4.71 MIL/uL (ref 3.87–5.11)
RDW: 12.3 % (ref 11.5–15.5)
WBC: 6.6 10*3/uL (ref 4.0–10.5)
nRBC: 0 % (ref 0.0–0.2)

## 2022-11-11 LAB — BASIC METABOLIC PANEL
Anion gap: 9 (ref 5–15)
BUN: 19 mg/dL (ref 8–23)
CO2: 27 mmol/L (ref 22–32)
Calcium: 9.5 mg/dL (ref 8.9–10.3)
Chloride: 100 mmol/L (ref 98–111)
Creatinine, Ser: 0.94 mg/dL (ref 0.44–1.00)
GFR, Estimated: >60 mL/min (ref >60)
Glucose, Bld: 178 mg/dL — ABNORMAL HIGH (ref 70–99)
Potassium: 4.2 mmol/L (ref 3.5–5.1)
Sodium: 136 mmol/L (ref 135–145)

## 2022-11-11 LAB — GLUCOSE, CAPILLARY: Glucose-Capillary: 204 mg/dL — ABNORMAL HIGH (ref 70–99)

## 2022-11-11 LAB — HEMOGLOBIN A1C
Hgb A1c MFr Bld: 6.7 % — ABNORMAL HIGH (ref 4.8–5.6)
Mean Plasma Glucose: 145.59 mg/dL

## 2022-11-11 NOTE — Progress Notes (Signed)
For Short Stay: COVID SWAB appointment date:  Bowel Prep reminder:   For Anesthesia: PCP - Georgianne Fick, MD  Cardiologist - Croitoru, Rachelle Hora, MD  Clearance: Robin Searing: NP: 10/20/22 Chest x-ray -  EKG - 10/18/22 Stress Test -  ECHO - 05/20/20 Cardiac Cath - 12/24/14 Pacemaker/ICD device last checked: Pacemaker orders received: Device Rep notified:  Spinal Cord Stimulator: N/A  Sleep Study - Yes CPAP - Yes  Fasting Blood Sugar - 100's Checks Blood Sugar : freestyle continuous monitor Date and result of last Hgb A1c-n7.7: 08/13/22  Last dose of GLP1 agonist- Mounjaro GLP1 instructions: Hold fter: 11/15/22.  Last dose of SGLT-2 inhibitors- Farxiga. SGLT-2 instructions: Hold after: 11/19/22  Blood Thinner Instructions:Eliquis: Hold after: 11/20/22 Aspirin Instructions: Hold after: 11/16/22 Last Dose:  Activity level: Can go up a flight of stairs and activities of daily living without stopping and without chest pain and/or shortness of breath   Able to exercise without chest pain and/or shortness of breath  Anesthesia review: Hx: HTN,DIA,TIA,Aflutter,Afib,OSA(CPAP),Insulin pump.  Patient denies shortness of breath, fever, cough and chest pain at PAT appointment   Patient verbalized understanding of instructions that were given to them at the PAT appointment. Patient was also instructed that they will need to review over the PAT instructions again at home before surgery.

## 2022-11-17 ENCOUNTER — Telehealth: Payer: Self-pay | Admitting: Cardiovascular Disease

## 2022-11-17 NOTE — Telephone Encounter (Signed)
Pt states that her rectal bleeding has stopped. Please advise

## 2022-11-17 NOTE — Telephone Encounter (Signed)
Spoke to patient who stated that she hasn't had rectal bleeding for about 4 days.

## 2022-11-17 NOTE — Telephone Encounter (Signed)
Call to patient voicemail picked up but it never beeped. Tried to return call to patient and it was busy x3. Will try again

## 2022-11-17 NOTE — Telephone Encounter (Signed)
Patient called back returning LPN's call.   Please advise.

## 2022-11-18 DIAGNOSIS — I10 Essential (primary) hypertension: Secondary | ICD-10-CM | POA: Diagnosis not present

## 2022-11-18 NOTE — Telephone Encounter (Signed)
Left message to return call 

## 2022-11-18 NOTE — Telephone Encounter (Signed)
Spoke to patient and she stated Dr Molli Hazard was aware. She did not stop Eliquis, It was just informations she pass on letting him know she was no longer having rectal bleeding.

## 2022-11-18 NOTE — Telephone Encounter (Signed)
Pt returning nurses phone call. Please advise ?

## 2022-11-18 NOTE — Telephone Encounter (Signed)
I'm sorry, but there is no mention of when the rectal bleeding occurred and why. So I am a little confused. If the question is regarding restarting Eliquis that was stopped, she can restart it.

## 2022-11-19 NOTE — H&P (Signed)
Last Botox 10/20/2018  Use speculum to reduce cystocele. Bladder mucosa trigone were normal. No stitch for bladder carcinoma. On daily prophylaxis. Injected 100 units of Botox in 10 cc of normal saline with my usual template at 5 and 0:86 a.m. and cephalad to the interureteric ridge. No bleeding. She tolerated very well. Patient will be followed as per protocol   TOday  Frequency stable. Incontinence stable. She did well after her last Botox and residual was 230 mL  Urgency incontinence did not is respond quite as well this last time she was still having urgency  She says she is not on daily prophylaxis. Clinically was not infected and urinalysis was normal   Today  In my absence patient had Botox by my partner November 05, 2020. Postvoid residual was 74 mL. 3 out of the last 4 urine cultures have been negative.  Urge incontinence persisting. Clinically not infected. Frequency stable  I reduced her cystocele with a speculum. I injected 100 units of Botox and 10 cc of normal saline with my usual template. I used 0.5 cc and 1 cc for the injection sites. Very well-tolerated. She had little bit of bleeding from some mild urethral mucosal prolapse. Reassurance given. Follow as per protocol   Today  I last saw the patient in June 2023. Her last Botox was on that day. Post procedure residual was 180 mL and she reportedly was doing well. She was seen 6 months later December 2023 and she said the Botox worked but not as long as it used to. She was given [place]. She has been having Botox for more than 5 years   She said it hurt so much last time she was not sure she could tolerate it again. She wants to do it under anesthesia. I will get a urine culture prior and give her IV antibiotics prior. Will proceed accordingly. Gemtesa helped for a month. Is no longer working as well. Clinically not infected. Could not leave a urine     ALLERGIES: [place] Nuts Cipro TABS Flagyl HydroCHLOROthiazide TABS MetFORMIN  HCl TABS Nuts    MEDICATIONS: Bactrim Ds 800 mg-160 mg tablet 1 PO BID starting the day before procedure  Gemtesa 75 mg tablet 1 tablet PO Daily  Metoprolol Tartrate 25 mg tablet  Aricept 10 mg tablet Oral  Aspirin 81 MG TABS Oral  Betamethasone Diprop Augmented 0.05 % lotion External  Carbatrol 200 mg capsule,extended release multiphase 12hr Oral  Clobetasol Propionate 0.05 % cream External  Diltiazem 24Hr Er (Cd) 360 mg capsule, ext release 24 hr  Farxiga 10 mg tablet  Folic Acid 1 mg tablet Oral  Furosemide 40 mg tablet  Gabapentin 300 mg capsule Oral  Hydrocodone-Acetaminophen 5 mg-325 mg tablet 1 tablet PO Daily PRN  Hydrocodone-Acetaminophen 5 mg-325 mg tablet 1 tablet PO Daily As Directed  Isosorbide Mononitrate Er 30 mg tablet, extended release 24 hr  Methotrexate 2.5 mg tablet  Mounjaro  NexIUM 40 MG Oral Capsule Delayed Release Oral  Novolog Flexpen 100 unit/ml (3 ml) insulin pen Subcutaneous  Pravastatin Sodium 40 mg tablet  Seroquel 25 mg tablet  Synthroid 75 mcg tablet Oral  Tresiba Flextouch U-100 100 unit/ml (3 ml) insulin pen  Victoza 3-Pak 0.6 mg/0.1 ml (18 mg/3 ml) pen injector Subcutaneous  Vimpat 50 mg tablet  Zofran 8 mg tablet Oral  Zyprexa 10 mg tablet     GU PSH: Complex Uroflow - 2019 Cystourethroscopy, W/Injection For Chemodenervation Of Bladder - 07/02/2021, 11/05/2020, 2021, 2021, 2020, 2020, 2018  Hysterectomy Unilat SO - 2011       PSH Notes: Hemorrhoidectomy, Breast Surgery, Heart Surgery, Foot Surgery, Eye Surgery, Hysterectomy   NON-GU PSH: Breast Surgery Procedure - 2011 Hemorrhoidectomy (favorite) - 2011     GU PMH: Mixed incontinence - 01/01/2022, - 07/17/2021, - 07/02/2021, Urge and stress incontinence, - 2016 Overactive bladder - 01/01/2022, - 05/22/2021, - 11/20/2020, - 11/05/2020, - 2021, Overactive bladder, - 2014 Acute Cystitis/UTI - 07/02/2021, - 11/20/2020 Cystocele, Unspec - 07/02/2021, Vaginal wall prolapse, - 2014 Urge  incontinence - 11/05/2020, - 2021 (Stable), - 2019, Urge incontinence of urine, - 2017 Urinary Frequency - 2021, (Stable), - 2018, Increased urinary frequency, - 2016 Nocturia, Nocturia - 2016 Nocturnal Enuresis, Enuresis, nocturnal only - 2016 Rectocele, Rectocele - 2016 Renal calculus, Nephrolithiasis - 2014 Stress Incontinence, Female stress incontinence - 2014 Urinary Tract Inf, Unspec site, Urinary tract infection - 2014      PMH Notes:  2009-12-16 14:47:33 - Note: Bipolar Disorder   NON-GU PMH: Encounter for general adult medical examination without abnormal findings, Encounter for preventive health examination - 2016 Personal history of other diseases of the circulatory system, History of atrial fibrillation - 2016, History of hypertension, - 2014 Personal history of other diseases of the digestive system, History of stomach ulcers - 2016, History of esophageal reflux, - 2014 Anxiety, Anxiety (Symptom) - 2014 Personal history of other diseases of the nervous system and sense organs, History of sleep apnea - 2014 Personal history of other endocrine, nutritional and metabolic disease, History of hypercholesterolemia - 2014, History of diabetes mellitus, - 2014 Personal history of other mental and behavioral disorders, History of depression - 2014 Atrial Fibrillation    FAMILY HISTORY: Acute Myocardial Infarction - Mother Blood In Urine - Brother Death In The Family Father - Runs In Family Diverticulitis Of Colon - Runs In Family Family Health Status - Mother's Age - Mother Family Health Status Number - Mother Kidney Cancer - Father nephrolithiasis - Uncle Prostate Cancer - Father   SOCIAL HISTORY: Marital Status: Widowed     Notes: Caffeine Use, Two children, Retired From Work, Marital History - Widowed, Alcohol Use, Tobacco Use   REVIEW OF SYSTEMS:    GU Review Female:   Patient reports frequent urination, hard to postpone urination, get up at night to urinate, and leakage  of urine. Patient denies burning /pain with urination, stream starts and stops, trouble starting your stream, have to strain to urinate, and being pregnant.  Gastrointestinal (Upper):   Patient denies nausea, vomiting, and indigestion/ heartburn.  Gastrointestinal (Lower):   Patient denies diarrhea and constipation.  Constitutional:   Patient denies fever, night sweats, weight loss, and fatigue.  Skin:   Patient denies skin rash/ lesion and itching.  Eyes:   Patient denies blurred vision and double vision.  Ears/ Nose/ Throat:   Patient denies sore throat and sinus problems.  Hematologic/Lymphatic:   Patient denies swollen glands and easy bruising.  Cardiovascular:   Patient denies leg swelling and chest pains.  Respiratory:   Patient denies cough and shortness of breath.  Endocrine:   Patient denies excessive thirst.  Musculoskeletal:   Patient denies back pain and joint pain.  Neurological:   Patient denies headaches and dizziness.  Psychologic:   Patient denies depression and anxiety.   VITAL SIGNS: None   PAST DATA REVIEW: None   PROCEDURES:          Visit Complexity - G2211    ASSESSMENT:      ICD-10 Details  1 GU:   Mixed incontinence - N39.46   2   Urinary Frequency - R35.0      PLAN:           Schedule Return Visit/Planned Activity: Return PRN - Office Visit             Note: We will schedule surgery

## 2022-11-23 ENCOUNTER — Encounter (HOSPITAL_COMMUNITY): Payer: Self-pay | Admitting: Urology

## 2022-11-23 ENCOUNTER — Ambulatory Visit (HOSPITAL_COMMUNITY): Payer: Medicare PPO | Admitting: Physician Assistant

## 2022-11-23 ENCOUNTER — Other Ambulatory Visit: Payer: Self-pay

## 2022-11-23 ENCOUNTER — Ambulatory Visit (HOSPITAL_COMMUNITY)
Admission: RE | Admit: 2022-11-23 | Discharge: 2022-11-23 | Disposition: A | Discharge status: Home / Self Care | Payer: Medicare PPO | Source: Ambulatory Visit | Attending: Urology | Admitting: Urology

## 2022-11-23 ENCOUNTER — Encounter (HOSPITAL_COMMUNITY)
Admission: RE | Disposition: A | Discharge status: Home / Self Care | Payer: Self-pay | Source: Ambulatory Visit | Attending: Urology

## 2022-11-23 ENCOUNTER — Ambulatory Visit (HOSPITAL_BASED_OUTPATIENT_CLINIC_OR_DEPARTMENT_OTHER): Payer: Medicare PPO | Admitting: Anesthesiology

## 2022-11-23 DIAGNOSIS — I110 Hypertensive heart disease with heart failure: Secondary | ICD-10-CM | POA: Diagnosis not present

## 2022-11-23 DIAGNOSIS — N3946 Mixed incontinence: Secondary | ICD-10-CM | POA: Diagnosis not present

## 2022-11-23 DIAGNOSIS — K449 Diaphragmatic hernia without obstruction or gangrene: Secondary | ICD-10-CM | POA: Diagnosis not present

## 2022-11-23 DIAGNOSIS — I509 Heart failure, unspecified: Secondary | ICD-10-CM | POA: Insufficient documentation

## 2022-11-23 DIAGNOSIS — N3941 Urge incontinence: Secondary | ICD-10-CM | POA: Diagnosis not present

## 2022-11-23 DIAGNOSIS — F319 Bipolar disorder, unspecified: Secondary | ICD-10-CM | POA: Insufficient documentation

## 2022-11-23 DIAGNOSIS — E1165 Type 2 diabetes mellitus with hyperglycemia: Secondary | ICD-10-CM

## 2022-11-23 DIAGNOSIS — R3915 Urgency of urination: Secondary | ICD-10-CM

## 2022-11-23 DIAGNOSIS — Z7985 Long-term (current) use of injectable non-insulin antidiabetic drugs: Secondary | ICD-10-CM | POA: Diagnosis not present

## 2022-11-23 DIAGNOSIS — R350 Frequency of micturition: Secondary | ICD-10-CM | POA: Diagnosis not present

## 2022-11-23 DIAGNOSIS — E119 Type 2 diabetes mellitus without complications: Secondary | ICD-10-CM | POA: Diagnosis not present

## 2022-11-23 DIAGNOSIS — Z7984 Long term (current) use of oral hypoglycemic drugs: Secondary | ICD-10-CM | POA: Insufficient documentation

## 2022-11-23 DIAGNOSIS — Z8673 Personal history of transient ischemic attack (TIA), and cerebral infarction without residual deficits: Secondary | ICD-10-CM | POA: Insufficient documentation

## 2022-11-23 DIAGNOSIS — E039 Hypothyroidism, unspecified: Secondary | ICD-10-CM | POA: Insufficient documentation

## 2022-11-23 DIAGNOSIS — G4730 Sleep apnea, unspecified: Secondary | ICD-10-CM | POA: Diagnosis not present

## 2022-11-23 DIAGNOSIS — Z79899 Other long term (current) drug therapy: Secondary | ICD-10-CM | POA: Insufficient documentation

## 2022-11-23 DIAGNOSIS — K219 Gastro-esophageal reflux disease without esophagitis: Secondary | ICD-10-CM | POA: Insufficient documentation

## 2022-11-23 DIAGNOSIS — Z794 Long term (current) use of insulin: Secondary | ICD-10-CM | POA: Insufficient documentation

## 2022-11-23 HISTORY — PX: BOTOX INJECTION: SHX5754

## 2022-11-23 LAB — GLUCOSE, CAPILLARY
Glucose-Capillary: 132 mg/dL — ABNORMAL HIGH (ref 70–99)
Glucose-Capillary: 95 mg/dL (ref 70–99)

## 2022-11-23 SURGERY — BOTOX INJECTION
Anesthesia: General | Site: Bladder

## 2022-11-23 MED ORDER — SULFAMETHOXAZOLE-TRIMETHOPRIM 800-160 MG PO TABS: 1.0000 | ORAL_TABLET | Freq: Two times a day (BID) | ORAL | 0 refills | Status: DC

## 2022-11-23 MED ORDER — FENTANYL CITRATE (PF) 100 MCG/2ML IJ SOLN
INTRAMUSCULAR | Status: DC | PRN
  Administered 2022-11-23: 12.5 ug via INTRAVENOUS

## 2022-11-23 MED ORDER — PROPOFOL 10 MG/ML IV BOLUS
INTRAVENOUS | Status: AC
  Filled 2022-11-23: qty 20

## 2022-11-23 MED ORDER — INSULIN ASPART 100 UNIT/ML IJ SOLN
0.0000 [IU] | INTRAMUSCULAR | Status: DC | PRN
Start: 2022-11-23 — End: 2022-11-23

## 2022-11-23 MED ORDER — CHLORHEXIDINE GLUCONATE 0.12 % MT SOLN
15.0000 mL | Freq: Once | OROMUCOSAL | Status: AC
  Administered 2022-11-23: 15 mL via OROMUCOSAL

## 2022-11-23 MED ORDER — ONDANSETRON HCL 4 MG/2ML IJ SOLN
INTRAMUSCULAR | Status: AC
Start: 2022-11-23 — End: ?
  Filled 2022-11-23: qty 2

## 2022-11-23 MED ORDER — LACTATED RINGERS IV SOLN: INTRAVENOUS | Status: DC

## 2022-11-23 MED ORDER — ONABOTULINUMTOXINA 100 UNITS IJ SOLR
INTRAMUSCULAR | Status: AC
Start: 2022-11-23 — End: ?
  Filled 2022-11-23: qty 100

## 2022-11-23 MED ORDER — LIDOCAINE HCL (PF) 2 % IJ SOLN
INTRAMUSCULAR | Status: AC
  Filled 2022-11-23: qty 5

## 2022-11-23 MED ORDER — SODIUM CHLORIDE 0.9 % IR SOLN
Status: DC | PRN
  Administered 2022-11-23: 3000 mL

## 2022-11-23 MED ORDER — LIDOCAINE HCL (CARDIAC) PF 100 MG/5ML IV SOSY
PREFILLED_SYRINGE | INTRAVENOUS | Status: DC | PRN
  Administered 2022-11-23: 40 mg via INTRAVENOUS

## 2022-11-23 MED ORDER — ONDANSETRON HCL 4 MG/2ML IJ SOLN
INTRAMUSCULAR | Status: DC | PRN
  Administered 2022-11-23: 4 mg via INTRAVENOUS

## 2022-11-23 MED ORDER — PROPOFOL 10 MG/ML IV BOLUS
INTRAVENOUS | Status: DC | PRN
  Administered 2022-11-23: 170 mg via INTRAVENOUS

## 2022-11-23 MED ORDER — SODIUM CHLORIDE (PF) 0.9 % IJ SOLN
INTRAMUSCULAR | Status: DC | PRN
  Administered 2022-11-23: 10 mL

## 2022-11-23 MED ORDER — DEXAMETHASONE SODIUM PHOSPHATE 10 MG/ML IJ SOLN
INTRAMUSCULAR | Status: AC
Start: 2022-11-23 — End: ?
  Filled 2022-11-23: qty 1

## 2022-11-23 MED ORDER — CEFAZOLIN SODIUM-DEXTROSE 2-4 GM/100ML-% IV SOLN
2.0000 g | INTRAVENOUS | Status: AC
  Administered 2022-11-23: 2 g via INTRAVENOUS
  Filled 2022-11-23: qty 100

## 2022-11-23 MED ORDER — DEXAMETHASONE SODIUM PHOSPHATE 10 MG/ML IJ SOLN
INTRAMUSCULAR | Status: DC | PRN
  Administered 2022-11-23: 5 mg via INTRAVENOUS

## 2022-11-23 MED ORDER — ONABOTULINUMTOXINA 100 UNITS IJ SOLR
INTRAMUSCULAR | Status: DC | PRN
  Administered 2022-11-23: 100 [IU] via INTRAMUSCULAR

## 2022-11-23 MED ORDER — FENTANYL CITRATE PF 50 MCG/ML IJ SOSY: 25.0000 ug | PREFILLED_SYRINGE | INTRAMUSCULAR | Status: DC | PRN

## 2022-11-23 MED ORDER — FENTANYL CITRATE (PF) 100 MCG/2ML IJ SOLN
INTRAMUSCULAR | Status: AC
  Filled 2022-11-23: qty 2

## 2022-11-23 MED ORDER — ORAL CARE MOUTH RINSE: 15.0000 mL | Freq: Once | OROMUCOSAL | Status: AC

## 2022-11-23 MED ORDER — SODIUM CHLORIDE (PF) 0.9 % IJ SOLN
INTRAMUSCULAR | Status: AC
  Filled 2022-11-23: qty 30

## 2022-11-23 MED ORDER — ONABOTULINUMTOXINA 100 UNITS IJ SOLR
INTRAMUSCULAR | Status: AC
  Filled 2022-11-23: qty 100

## 2022-11-23 SURGICAL SUPPLY — 14 items
BAG URO CATCHER STRL LF (MISCELLANEOUS) ×1 IMPLANT
CLOTH BEACON ORANGE TIMEOUT ST (SAFETY) ×1 IMPLANT
GLOVE SURG LX STRL 7.5 STRW (GLOVE) ×1 IMPLANT
GOWN STRL REUS W/ TWL XL LVL3 (GOWN DISPOSABLE) ×1 IMPLANT
GOWN STRL REUS W/TWL XL LVL3 (GOWN DISPOSABLE) ×1
NDL ASPIRATION 22 (NEEDLE) ×1 IMPLANT
NDL SAFETY ECLIPSE 18X1.5 (NEEDLE) IMPLANT
NEEDLE ASPIRATION 22 (NEEDLE) ×1
PACK CYSTO (CUSTOM PROCEDURE TRAY) ×1 IMPLANT
PAD PREP 24X48 CUFFED NSTRL (MISCELLANEOUS) ×1 IMPLANT
SYR 10ML LL (SYRINGE) IMPLANT
SYR CONTROL 10ML LL (SYRINGE) IMPLANT
TUBING CONNECTING 10 (TUBING) IMPLANT
WATER STERILE IRR 3000ML UROMA (IV SOLUTION) ×1 IMPLANT

## 2022-11-23 NOTE — Anesthesia Postprocedure Evaluation (Signed)
Anesthesia Post Note  Patient: KAISA WOFFORD  Procedure(s) Performed: CYSTOBOTOX INJECTION (Bladder)     Patient location during evaluation: PACU Anesthesia Type: General Level of consciousness: awake and alert Pain management: pain level controlled Vital Signs Assessment: post-procedure vital signs reviewed and stable Respiratory status: spontaneous breathing, nonlabored ventilation, respiratory function stable and patient connected to nasal cannula oxygen Cardiovascular status: blood pressure returned to baseline and stable Postop Assessment: no apparent nausea or vomiting Anesthetic complications: no  No notable events documented.  Last Vitals:  Vitals:   11/23/22 1145 11/23/22 1200  BP: (!) 147/65 (!) 147/85  Pulse: 63 (!) 45  Resp: 15 16  Temp: (!) 36 C 36.6 C  SpO2: 99% 96%    Last Pain:  Vitals:   11/23/22 1200  TempSrc:   PainSc: 0-No pain                 Shelton Silvas

## 2022-11-23 NOTE — Anesthesia Procedure Notes (Signed)
Procedure Name: LMA Insertion Date/Time: 11/23/2022 10:47 AM  Performed by: Hulan Fess, CRNAPre-anesthesia Checklist: Patient identified, Emergency Drugs available, Suction available and Patient being monitored Patient Re-evaluated:Patient Re-evaluated prior to induction Oxygen Delivery Method: Circle System Utilized Preoxygenation: Pre-oxygenation with 100% oxygen Induction Type: IV induction Ventilation: Mask ventilation without difficulty LMA: LMA inserted LMA Size: 4.0 Number of attempts: 1 Airway Equipment and Method: Bite block Placement Confirmation: positive ETCO2 Tube secured with: Tape Dental Injury: Teeth and Oropharynx as per pre-operative assessment

## 2022-11-23 NOTE — Op Note (Signed)
Preoperative diagnosis: Refractory urgency incontinence Postoperative diagnosis: Refractory urgency incontinence Surgery: Cystoscopy injection of botulinum toxin Surgeon: Dr. Lorin Picket Mcihael Hinderman  The patient has the above diagnosis and consented the above procedure.  Extra care was taken with leg positioning to minimize the risk of compartment syndrome neuropathy and deep vein thrombosis.  She had a large posterior defect that was reduced minimally so I could not insert the injection scope with trocar easily.  Bladder mucosa and trigone were normal.  No significant cystocele.  No cystitis.  I injected 100 units of Botox and 10 cc of normal saline in the lower third of the bladder with my usual templates.  And trigone.  There was no bleeding.  Bladder was left partially full  Patient will be followed as per protocol

## 2022-11-23 NOTE — Anesthesia Preprocedure Evaluation (Addendum)
Anesthesia Evaluation  Patient identified by MRN, date of birth, ID band Patient awake    Reviewed: Allergy & Precautions, NPO status , Patient's Chart, lab work & pertinent test results  Airway Mallampati: III  TM Distance: >3 FB Neck ROM: Full    Dental  (+) Poor Dentition, Dental Advisory Given   Pulmonary sleep apnea    breath sounds clear to auscultation       Cardiovascular hypertension, Pt. on medications +CHF  + dysrhythmias  Rhythm:Regular Rate:Normal     Neuro/Psych  PSYCHIATRIC DISORDERS  Depression Bipolar Disorder   TIA   GI/Hepatic Neg liver ROS, hiatal hernia,GERD  Medicated,,  Endo/Other  diabetes, Type 2, Insulin DependentHypothyroidism    Renal/GU Renal disease     Musculoskeletal negative musculoskeletal ROS (+)    Abdominal   Peds  Hematology negative hematology ROS (+)   Anesthesia Other Findings   Reproductive/Obstetrics                             Anesthesia Physical Anesthesia Plan  ASA: 3  Anesthesia Plan: General   Post-op Pain Management: Tylenol PO (pre-op)*   Induction: Intravenous  PONV Risk Score and Plan: 4 or greater and Ondansetron and Treatment may vary due to age or medical condition  Airway Management Planned: LMA  Additional Equipment: None  Intra-op Plan:   Post-operative Plan: Extubation in OR  Informed Consent: I have reviewed the patients History and Physical, chart, labs and discussed the procedure including the risks, benefits and alternatives for the proposed anesthesia with the patient or authorized representative who has indicated his/her understanding and acceptance.     Dental advisory given  Plan Discussed with: CRNA  Anesthesia Plan Comments: (GLP-1 > 1 week ago)       Anesthesia Quick Evaluation

## 2022-11-23 NOTE — Interval H&P Note (Signed)
History and Physical Interval Note:  11/23/2022 10:02 AM  [name]  has presented today for surgery, with the diagnosis of REFRACTORY URGENCY INCONTINENCE.  The various methods of treatment have been discussed with the patient and family. After consideration of risks, benefits and other options for treatment, the patient has consented to  Procedure(s): CYSTOBOTOX INJECTION (N/A) as a surgical intervention.  The patient's history has been reviewed, patient examined, no change in status, stable for surgery.  I have reviewed the patient's chart and labs.  Questions were answered to the patient's satisfaction.     Ilaria Much A [place] Pallas

## 2022-11-23 NOTE — Progress Notes (Signed)
Cardiology Clinic Note   Patient Name: ADDISIN HEGEMAN Date of Encounter: 11/26/2022  Primary Care Provider:  Georgianne Fick, MD Primary Cardiologist:  Thurmon Fair, MD  Patient Profile    TIDA GRUENER 82 year old female presents to the clinic today for follow-up evaluation of her atrial flutter.  Past Medical History    Past Medical History:  Diagnosis Date   Bipolar disorder (HCC)    CHF (congestive heart failure) (HCC)    Depression    Bipolar   Dysrhythmia    GERD (gastroesophageal reflux disease)    History of hiatal hernia    History of kidney stones    History of stomach ulcers 1970s   "bleeding"   Hyperlipidemia    Hypertension    Hypothyroidism    Ischemic colitis, enteritis, or enterocolitis (HCC)    OSA on CPAP    Pericarditis 05/06/2017   Pneumonia    "several times" (06/23/2017)   Thyroid disease    TIA (transient ischemic attack) 2011 X 2   Type II diabetes mellitus (HCC)    Urinary bladder incontinence    Vertigo    Past Surgical History:  Procedure Laterality Date   APPENDECTOMY     BOTOX INJECTION N/A 11/23/2022   Procedure: CYSTOBOTOX INJECTION;  Surgeon: Alfredo Martinez, MD;  Location: [facility];  Service: Urology;  Laterality: N/A;   BREAST CYST ASPIRATION Bilateral    CARDIAC CATHETERIZATION N/A 12/24/2014   Procedure: Right/Left Heart Cath and Coronary Angiography;  Surgeon: Thurmon Fair, MD;  Location: [facility];  Service: Cardiovascular;  Laterality: N/A;   CATARACT EXTRACTION W/ INTRAOCULAR LENS  IMPLANT, BILATERAL Bilateral    EXCISIONAL HEMORRHOIDECTOMY     EYE SURGERY     FRACTURE SURGERY     IR THORACENTESIS ASP PLEURAL SPACE W/IMG GUIDE  05/13/2017   LAPAROSCOPIC CHOLECYSTECTOMY     LITHOTRIPSY  "several times"   RETINAL DETACHMENT SURGERY     "think it was on the left; not sure" (06/23/2017)   TONSILLECTOMY     VAGINAL HYSTERECTOMY     partial     Allergies  Allergies  Allergen Reactions   Flagyl  [Metronidazole Hcl] Itching and Rash   Ciprofloxacin Itching and Rash   Metformin And Related Rash   Methimazole Rash   Milk-Related Compounds Other (See Comments)    Stomach pains   Other Other (See Comments)    [place] nuts cause severe facial redness    History of Present Illness    LINCY HUNDT has a PMH of HTN, angina, pericardial effusion, OSA, bilateral pleural effusion, community-acquired pneumonia, type 2 diabetes, AKI, hyperlipidemia, obesity, syncope, atypical chest pain, abdominal pain, TIA, and atrial flutter.  She was noted to have acute pericarditis in 2019.  She was also noted to have previous episodes of intermittent shortness of breath and lower extremity swelling which responded well to diuresis.  She did receive a course of metolazone and loop diuretics which caused significant hypovolemia and acute kidney injury.  Echocardiogram showed hyperdynamic LV function and no significant valvular abnormalities 5/22.  Her cardiac MRI 4/19 showed normal LV function, mild LVH, and no evidence of LGE.  She was seen in follow-up by Dr. Royann Shivers 12/23.  At that time she felt well.  She had been started on Mounjaro.  She had not started losing weight.  She denied PND and orthopnea.  She denied shortness of breath and continued with chronic mild ankle edema.  She denied chest pain and syncope.  She followed up with KatlynWest NP on 09/21/2022.  She had reported weight gain and lower extremity swelling.  She reported that she was doing well and had significant improvement with her lower extremity edema.  However, she was not feeling back to normal.  She did note some mild dyspnea on exertion.  She denied chest pain and orthopnea.  She did note palpitations and noted an increased amount of palpitations over the last few months.  She was noted to be in atrial flutter.  She was started on Eliquis and recommendations for DCCV were reviewed.  She presented to the clinic 10/18/22 for follow-up  evaluation and stated she continued to have fatigue x 2-3 months with walking.  She reported compliance with her apixaban.  She did note that she has had bright red blood in her stools for several weeks.  She had not brought this to the attention of any healthcare provider.  Her blood pressure initially was 142/74 and on recheck was 136/73.  I repeated a CBC, instructed her to follow-up with her PCP that week and planned follow-up in 1-2 months.  I also increased her olmesartan to 7.5 mg daily.  Her CBC showed stable hemoglobin.  She presents to the clinic today for follow-up evaluation and states she is pleased that her blood pressure is well-controlled.  We reviewed her previous blood in stool and her previous cardiology clinic visit.  I reviewed her CBC.  She expressed understanding.  She asked about her blood sugar and Zyprexa medication.  I will defer to endocrinology for recommendations.  I have asked her to increase her p.o. hydration, increase her physical activity and we will plan follow-up in 4 to 6 months.  Today she denies chest pain, shortness of breath, lower extremity edema,  palpitations,  diaphoresis, weakness, presyncope, syncope, orthopnea, and PND.    Home Medications    Prior to Admission medications   Medication Sig Start Date End Date Taking? Authorizing Provider  Accu-Chek Softclix Lancets lancets Use 1-4 times daily as needed/instructed  DX E11.9 10/27/19   Reather Littler, MD  apixaban (ELIQUIS) 5 MG TABS tablet Take 1 tablet (5 mg total) by mouth 2 (two) times daily. 09/21/22   Reather Littler D, NP  aspirin EC 81 MG EC tablet Take 1 tablet (81 mg total) by mouth daily. 05/18/17   Burnadette Pop, MD  Blood Glucose Monitoring Suppl (ACCU-CHEK GUIDE) w/Device KIT Use 1-4 times daily as needed/directed  DX E11.9 10/27/19   Reather Littler, MD  carbamazepine (CARBATROL) 200 MG 12 hr capsule Take 200 mg by mouth 2 (two) times daily.     [provider]  Cholecalciferol (QC VITAMIN D3)  50 MCG (2000 UT) TABS Take 2,000 Units by mouth daily.    [provider]  clobetasol (TEMOVATE) 0.05 % GEL Apply 1 application topically 2 (two) times daily as needed (rash).     [provider]  Continuous Blood Gluc Sensor (FREESTYLE LIBRE 2 SENSOR) MISC Use to check blood sugar. Change every 14 days Patient not taking: Reported on 09/21/2022 09/17/20   Reather Littler, MD  Continuous Glucose Receiver (FREESTYLE LIBRE 3 READER) DEVI Use for sensor 08/20/22   Reather Littler, MD  Continuous Glucose Sensor (FREESTYLE LIBRE 3 SENSOR) MISC 1 Device by Does not apply route every 14 (fourteen) days. Apply 1 sensor on upper arm every 14 days for continuous glucose monitoring 06/08/22   Reather Littler, MD  Continuous Glucose Sensor (FREESTYLE LIBRE 3 SENSOR) MISC 1 Device by  Does not apply route every 14 (fourteen) days. Apply 1 sensor on upper arm every 14 days for continuous glucose monitoring 08/20/22   Reather Littler, MD  dapagliflozin propanediol (FARXIGA) 10 MG TABS tablet TAKE 1 TABLET(10 MG) BY MOUTH DAILY 07/15/22   Reather Littler, MD  diltiazem (CARDIZEM CD) 240 MG 24 hr capsule Take 1 capsule (240 mg total) by mouth daily. 08/02/22   Croitoru, Mihai, MD  donepezil (ARICEPT) 10 MG tablet Take 10 mg by mouth at bedtime. 01/22/19   [provider]  esomeprazole (NEXIUM) 40 MG capsule Take 1 capsule (40 mg total) by mouth daily at 12 noon. 06/27/17   Oretha Milch, MD  famotidine (PEPCID) 20 MG tablet Take 1 tablet (20 mg total) by mouth 2 (two) times daily. 01/27/21   Croitoru, Mihai, MD  folic acid (FOLVITE) 1 MG tablet Take 1 mg by mouth daily.    [provider]  furosemide (LASIX) 40 MG tablet TAKE 1 TABLET BY MOUTH EVERY DAY 08/19/21   Croitoru, Mihai, MD  gabapentin (NEURONTIN) 300 MG capsule Take 300 mg by mouth at bedtime.    [provider]  glucose blood (ACCU-CHEK GUIDE) test strip Use 1-4 times daily as needed/instructed DX E11.9 10/27/19   Reather Littler, MD  insulin aspart  (NOVOLOG) 100 UNIT/ML injection INJECT 80 UNITS MAX PER DAY 09/16/22   Reather Littler, MD  Insulin Aspart FlexPen (NOVOLOG) 100 UNIT/ML ADMINISTER 14 TO 16 UNITS UNDER THE SKIN BEFORE MEALS AS DIRECTED 08/09/22   Reather Littler, MD  Insulin Disposable Pump (V-GO 40) 40 UNIT/24HR KIT Apply 1 kit topically daily. 11/03/21   Reather Littler, MD  Insulin Disposable Pump (V-GO 40) KIT USE 1 KIT DAILY AS DIRECTED 05/28/20   Reather Littler, MD  Insulin Pen Needle (BD PEN NEEDLE NANO U/F) 32G X 4 MM MISC USE 8 PEN NEEDLES PER DAY 09/18/18   Reather Littler, MD  Insulin Pen Needle (COMFORT EZ PEN NEEDLES) 33G X 5 MM MISC 1 each by Does not apply route daily. Use with Evaristo Bury pen 09/23/17   Reather Littler, MD  Insulin Pen Needle 32G X 5 MM MISC 1 each by Does not apply route daily. Use with Evaristo Bury pen 09/27/17   Reather Littler, MD  levothyroxine (SYNTHROID, LEVOTHROID) 75 MCG tablet Take 75 mcg by mouth daily before breakfast.    [provider]  methotrexate (RHEUMATREX) 2.5 MG tablet Take 10 mg by mouth every Monday. For rash    [provider]  montelukast (SINGULAIR) 5 MG chewable tablet Chew 5 mg by mouth at bedtime.    [provider]  OLANZapine (ZYPREXA) 7.5 MG tablet Take 15 mg by mouth at bedtime.    [provider]  olmesartan (BENICAR) 5 MG tablet Take 5 mg by mouth daily.    [provider]  ondansetron (ZOFRAN) 4 MG tablet Take 4 mg by mouth every 8 (eight) hours as needed for nausea or vomiting.    [provider]  Dola Argyle LANCETS 33G MISC USE LANCET TO TEST FIVE TIMES A DAY 05/31/17   Reather Littler, MD  pravastatin (PRAVACHOL) 20 MG tablet Take 20 mg by mouth daily. 07/14/20   [provider]  QUEtiapine (SEROQUEL) 25 MG tablet Take 100 mg by mouth at bedtime. 12/29/18   [provider]  spironolactone (ALDACTONE) 25 MG tablet TAKE 1 TABLET BY MOUTH EVERY DAY 10/19/21   Croitoru, Mihai, MD  tirzepatide [facility]) 10 MG/0.5ML Pen Inject 10 mg into  the  skin once a week. 09/29/22   Thapa, [place], MD  Vibegron (GEMTESA PO) Take 1 tablet by mouth daily in the afternoon.    [provider]    Family History    Family History  Problem Relation Age of Onset   Heart disease Mother    Stroke Father    Parkinson's disease Father    Cancer Sister    Cancer Brother    She indicated that her mother is alive. She indicated that her father is deceased. She indicated that her sister is alive. She indicated that the status of her brother is unknown.  Social History    Social History   Socioeconomic History   Marital status: Widowed    Spouse name: Not on file   Number of children: 2   Years of education: 11   Highest education level: Not on file  Occupational History   Occupation: Retired  Tobacco Use   Smoking status: Never   Smokeless tobacco: Never  Vaping Use   Vaping status: Never Used  Substance and Sexual Activity   Alcohol use: Never   Drug use: Never   Sexual activity: Not on file  Other Topics Concern   Not on file  Social History Narrative   Lives at home alone.   Right-handed.   No caffeine use.   Social Determinants of Health   Financial Resource Strain: Not on file  Food Insecurity: Not on file  Transportation Needs: Not on file  Physical Activity: Not on file  Stress: Not on file  Social Connections: Not on file  Intimate Partner Violence: Not on file     Review of Systems    General:  No chills, fever, night sweats or weight changes.  Cardiovascular:  No chest pain, dyspnea on exertion, edema, orthopnea, palpitations, paroxysmal nocturnal dyspnea. Dermatological: No rash, lesions/masses Respiratory: No cough, dyspnea Urologic: No hematuria, dysuria Abdominal:   No nausea, vomiting, diarrhea, bright red blood per rectum, melena, or hematemesis Neurologic:  No visual changes, wkns, changes in mental status. All other systems reviewed and are otherwise negative except as noted  above.  Physical Exam    VS:  BP 116/66 (BP Location: Left Arm, Patient Position: Sitting, Cuff Size: Normal)   Pulse 79   Ht 5\' 3"  (1.6 m)   Wt 227 lb 6.4 oz (103.1 kg)   SpO2 95%   BMI 40.28 kg/m  , BMI Body mass index is 40.28 kg/m. GEN: Well nourished, well developed, in no acute distress. HEENT: normal. Neck: Supple, no JVD, carotid bruits, or masses. Cardiac: RRR, no murmurs, rubs, or gallops. No clubbing, cyanosis, bilateral ankle edema.  Radials/DP/PT 2+ and equal bilaterally.  Respiratory:  Respirations regular and unlabored, clear to auscultation bilaterally. GI: Soft, nontender, nondistended, BS + x 4. MS: no deformity or atrophy. Skin: warm and dry, no rash. Neuro:  Strength and sensation are intact. Psych: Normal affect.  Accessory Clinical Findings    Recent Labs: 09/21/2022: Magnesium 2.2; TSH 2.790 11/11/2022: Hemoglobin 14.2; Platelets 345 11/25/2022: BUN 23; Creatinine, Ser 1.37; Potassium 4.3; Sodium 132   Recent Lipid Panel    Component Value Date/Time   CHOL 149 03/26/2017 1710   TRIG 130 03/26/2017 1710   HDL 56 12/24/2019 0000   CHOLHDL 3.0 03/26/2017 1710   VLDL 26 03/26/2017 1710   LDLCALC 82 12/24/2019 0000         ECG personally reviewed by me today-   none today.   Echocardiogram 05/20/2020  IMPRESSIONS  1. Left ventricular ejection fraction, by estimation, is 70 to 75%. The  left ventricle has hyperdynamic function. The left ventricle has no  regional wall motion abnormalities. There is moderate concentric left  ventricular hypertrophy. Left ventricular  diastolic parameters are consistent with Grade I diastolic dysfunction  (impaired relaxation).   2. Right ventricular systolic function is normal. The right ventricular  size is normal. Tricuspid regurgitation signal is inadequate for assessing  PA pressure.   3. The mitral valve is degenerative. Trivial mitral valve regurgitation.  No evidence of mitral stenosis.   4. The  aortic valve is tricuspid. There is mild calcification of the  aortic valve. Aortic valve regurgitation is not visualized. No aortic  stenosis is present.   Comparison(s): No significant change from prior study.   FINDINGS   Left Ventricle: Left ventricular ejection fraction, by estimation, is 70  to 75%. The left ventricle has hyperdynamic function. The left ventricle  has no regional wall motion abnormalities. The left ventricular internal  cavity size was normal in size.  There is moderate concentric left ventricular hypertrophy. Left  ventricular diastolic parameters are consistent with Grade I diastolic  dysfunction (impaired relaxation).   Right Ventricle: The right ventricular size is normal. No increase in  right ventricular wall thickness. Right ventricular systolic function is  normal. Tricuspid regurgitation signal is inadequate for assessing PA  pressure.   Left Atrium: Left atrial size was normal in size.   Right Atrium: Right atrial size was normal in size.   Pericardium: Trivial pericardial effusion is present. Presence of  pericardial fat pad.   Mitral Valve: The mitral valve is degenerative in appearance. Mild mitral  annular calcification. Trivial mitral valve regurgitation. No evidence of  mitral valve stenosis.   Tricuspid Valve: The tricuspid valve is grossly normal. Tricuspid valve  regurgitation is not demonstrated. No evidence of tricuspid stenosis.   Aortic Valve: The aortic valve is tricuspid. There is mild calcification  of the aortic valve. Aortic valve regurgitation is not visualized. No  aortic stenosis is present.   Pulmonic Valve: The pulmonic valve was grossly normal. Pulmonic valve  regurgitation is not visualized. No evidence of pulmonic stenosis.   Aorta: The aortic root and ascending aorta are structurally normal, with  no evidence of dilitation.   Venous: The inferior vena cava was not well visualized.   IAS/Shunts: The atrial septum  is grossly normal.          Assessment & Plan   1.Essential hypertension-BP today 116/66.  Improved with increased olmesartan.  BMP stable 11/11/22 Continue spironolactone, Cardizem, olmesartan Maintain blood pressure log Low-sodium diet   Chronic diastolic CHF-weight today 227.4.   Continue furosemide-may take an extra dose of furosemide for weight increase of 2 to 3 pounds overnight or 5 pounds in 1 week.-Reviewed Continue spironolactone, Farxiga, olmesartan Heart healthy low-sodium diet Elevate lower extremities when not active Maintain weight log   Atrial flutter-heart rate today 79.  Compliant with apixaban.  Again denies bleeding issues. This patients CHA2DS2-VASc Score and unadjusted Ischemic Stroke Rate (% per year) is equal to 9.7 % stroke rate/year from a score of 6 Above score calculated as 1 point each if present [CHF, HTN, DM, Female,Age > 75, ]-6 Continue Cardizem, apixaban Recent CBC were reassuring  Blood in stool-seen and evaluated by PCP/GI.  Bleeding resolved.  Hemoglobin stable. Continues to follow with PCP  Hyperlipidemia-LDL 46 on 6/24. Continue current medical therapy Continue high-fiber diet    Disposition: Follow-up with Dr.  Croitoru or me in 4-6 months.   Thomasene Ripple. Tytan Sandate NP-C     11/26/2022, 11:22 AM [facility] [address] Office [phone] Fax [phone]    I spent 13 minutes examining this patient, reviewing medications, and using patient centered shared decision making involving her cardiac care.  Prior to her visit I spent greater than 20 minutes reviewing her past medical history,  medications, and prior cardiac tests.

## 2022-11-23 NOTE — Transfer of Care (Signed)
Immediate Anesthesia Transfer of Care Note  Patient: [name]  Procedure(s) Performed: CYSTOBOTOX INJECTION (Bladder)  Patient Location: PACU  Anesthesia Type:General  Level of Consciousness: awake  Airway & Oxygen Therapy: Patient Spontanous Breathing and Patient connected to nasal cannula oxygen  Post-op Assessment: Report given to RN and Post -op Vital signs reviewed and stable  Post vital signs: Reviewed and stable  Last Vitals:  Vitals Value Taken Time  BP 148/71 11/23/22 1115  Temp    Pulse 65 11/23/22 1117  Resp 16 11/23/22 1117  SpO2 97 % 11/23/22 1117  Vitals shown include unfiled device data.  Last Pain:  Vitals:   11/23/22 0837  TempSrc: Oral         Complications: No notable events documented.

## 2022-11-23 NOTE — Discharge Instructions (Signed)
I have reviewed discharge instructions in detail with the patient. They will follow-up with me or their physician as scheduled. My nurse will also be calling the patients as per protocol.   

## 2022-11-24 ENCOUNTER — Encounter (HOSPITAL_COMMUNITY): Payer: Self-pay | Admitting: Urology

## 2022-11-25 ENCOUNTER — Other Ambulatory Visit: Payer: Medicare PPO

## 2022-11-25 DIAGNOSIS — E1165 Type 2 diabetes mellitus with hyperglycemia: Secondary | ICD-10-CM | POA: Diagnosis not present

## 2022-11-25 DIAGNOSIS — Z794 Long term (current) use of insulin: Secondary | ICD-10-CM | POA: Diagnosis not present

## 2022-11-25 LAB — BASIC METABOLIC PANEL
BUN: 23 mg/dL (ref 6–23)
CO2: 29 meq/L (ref 19–32)
Calcium: 9.3 mg/dL (ref 8.4–10.5)
Chloride: 93 meq/L — ABNORMAL LOW (ref 96–112)
Creatinine, Ser: 1.37 mg/dL — ABNORMAL HIGH (ref 0.40–1.20)
GFR: 36.2 mL/min — ABNORMAL LOW (ref >60.00)
Glucose, Bld: 157 mg/dL — ABNORMAL HIGH (ref 70–99)
Potassium: 4.3 meq/L (ref 3.5–5.1)
Sodium: 132 meq/L — ABNORMAL LOW (ref 135–145)

## 2022-11-25 LAB — HEMOGLOBIN A1C: Hgb A1c MFr Bld: 7 % — ABNORMAL HIGH (ref 4.6–6.5)

## 2022-11-26 ENCOUNTER — Ambulatory Visit: Payer: Medicare PPO | Attending: General Practice | Admitting: General Practice

## 2022-11-26 ENCOUNTER — Encounter: Payer: Self-pay | Admitting: General Practice

## 2022-11-26 VITALS — BP 116/66 | HR 79 | Ht 63.0 in | Wt 227.4 lb

## 2022-11-26 DIAGNOSIS — K921 Melena: Secondary | ICD-10-CM | POA: Diagnosis not present

## 2022-11-26 DIAGNOSIS — E7800 Pure hypercholesterolemia, unspecified: Secondary | ICD-10-CM

## 2022-11-26 DIAGNOSIS — I5032 Chronic diastolic (congestive) heart failure: Secondary | ICD-10-CM | POA: Diagnosis not present

## 2022-11-26 DIAGNOSIS — I4892 Unspecified atrial flutter: Secondary | ICD-10-CM | POA: Diagnosis not present

## 2022-11-26 DIAGNOSIS — I10 Essential (primary) hypertension: Secondary | ICD-10-CM

## 2022-11-26 NOTE — Patient Instructions (Addendum)
Medication Instructions:  The current medical regimen is effective;  continue present plan and medications as directed. Please refer to the Current Medication list given to you today.  *If you need a refill on your cardiac medications before your next appointment, please call your pharmacy*  Lab Work: NONE If you have labs (blood work) drawn today and your tests are completely normal, you will receive your results only by: MyChart Message (if you have MyChart) OR  A paper copy in the mail If you have any lab test that is abnormal or we need to change your treatment, we will call you to review the results.  Other Instructions INCREASE HYDRATION WALK MOST DAYS-EVEN SHORT AMOUNT OF TIME DISCUSS ZYPREXA AND BLOOD SUGAR WITH ENDOCRINOLOGIST   Follow-Up: At [facility], you and your health needs are our priority.  As part of our continuing mission to provide you with exceptional heart care, we have created designated Provider Care Teams.  These Care Teams include your primary Cardiologist (physician) and Advanced Practice Providers (APPs -  Physician Assistants and Nurse Practitioners) who all work together to provide you with the care you need, when you need it.  Your next appointment:   4-6 month(s)  Provider:   Thurmon Fair, MD

## 2022-12-03 ENCOUNTER — Encounter: Payer: Self-pay | Admitting: Endocrinology

## 2022-12-03 ENCOUNTER — Ambulatory Visit (INDEPENDENT_AMBULATORY_CARE_PROVIDER_SITE_OTHER): Payer: Medicare PPO | Admitting: Endocrinology

## 2022-12-03 VITALS — BP 150/62 | HR 87 | Resp 18 | Ht 63.0 in | Wt 222.6 lb

## 2022-12-03 DIAGNOSIS — E118 Type 2 diabetes mellitus with unspecified complications: Secondary | ICD-10-CM | POA: Diagnosis not present

## 2022-12-03 DIAGNOSIS — Z794 Long term (current) use of insulin: Secondary | ICD-10-CM

## 2022-12-03 NOTE — Progress Notes (Signed)
Outpatient Endocrinology Note [place] Lyriq Finerty, MD  12/03/22  Patient's Name: [name]    DOB: Sep 18, 1940    MRN: [id number]                                                    REASON OF VISIT: Follow up for type 2 diabetes mellitus  PCP: Georgianne Fick, MD  HISTORY OF PRESENT ILLNESS:   [name] is a 82 y.o. old female with past medical history listed below, is here for follow up of type 2 diabetes mellitus.   Pertinent Diabetes History: Patient was diagnosed with type 2 diabetes mellitus in 2011.  She was apparently intolerant to metformin and was treated with Onglyza until about 2014.  Insulin therapy was started around 2014 initially on basal insulin and later mealtime insulin was added. She has been on V-Go insulin pump since 09/2014.   Chronic Diabetes Complications : Retinopathy: no. Last ophthalmology exam was done on annually, reportedly.  Nephropathy: no, on olmesartan Peripheral neuropathy: yes, painful paresthesia, on gabapentin. Coronary artery disease: no Stroke: yes, TIA  Relevant comorbidities and cardiovascular risk factors: Obesity: yes Body mass index is 39.43 kg/m.  Hypertension: yes Hyperlipidemia. Yes,on statin  Current / Home Diabetic regimen includes: V-Go pump 40 units basal, meal times with 10 - 14 units with breakfast, lunch and dinner. Farxiga 10 mg daily. Mounjaro 10 mg weekly.  For  meals breakfast, lunch and supper 5-7 click depending upon meal size. For large meal take 7 click, regular meal take 6 click and smaller meal 5 clicks.  This will be 10 to 14 units with each meals.  Prior diabetic medications: Metformin - intolerance.  Onglyza  Glycemic data:    CONTINUOUS GLUCOSE MONITORING SYSTEM (CGMS) INTERPRETATION: At today's visit, we reviewed CGM downloads. The full report is scanned in the media. Reviewing the CGM trends, blood glucose are as follows:  FreeStyle Libre 3 CGM-  Sensor Download (Sensor download was  reviewed and summarized below.) Dates: November 2 to December 03, 2022, 14 days Sensor Average: 153 Glucose Management Indicator: 7.0% Glucose Variability: 32.9%  % data captured:96%  Glycemic Trends:  <54: 0% 54-70: 0% 71-180: 78% 181-250: 15% 251-400: 7%  Interpretation: Mostly acceptable blood sugar with meals, in between the meals and overnight.  She had occasional hyperglycemia with blood sugar up to 250 range on the first week and on November 5 and 6 he had blood sugar up to 300s probably related with high carb meal.  Last 1 week has been perfectly good blood sugars.  She had 1 episode of blood sugar 68 in the morning on November 5 otherwise no other hypoglycemia in the last 2 weeks.  Hypoglycemia: Patient has no significant hypoglycemic episodes. Patient has hypoglycemia awareness.  Factors modifying glucose control: 1.  Diabetic diet assessment: three meals a day.  2.  Staying active or exercising: no formal exercise. Walks indoor.   3.  Medication compliance: compliant all of the time.  # Primary hypothyroidism : for several years, on levothyroxine 75 mcg daily,  managed by PCP.   Interval history  CGM data as reviewed above.  Improvement in blood sugar.  Recent hemoglobin A1c 7%.  GMI on CGM 7%.  She has been tolerating Mounjaro well.  No other complaints today.  REVIEW OF SYSTEMS As per history of  present illness.   PAST MEDICAL HISTORY: Past Medical History:  Diagnosis Date   Bipolar disorder (HCC)    CHF (congestive heart failure) (HCC)    Depression    Bipolar   Dysrhythmia    GERD (gastroesophageal reflux disease)    History of hiatal hernia    History of kidney stones    History of stomach ulcers 1970s   "bleeding"   Hyperlipidemia    Hypertension    Hypothyroidism    Ischemic colitis, enteritis, or enterocolitis (HCC)    OSA on CPAP    Pericarditis 05/06/2017   Pneumonia    "several times" (06/23/2017)   Thyroid disease    TIA (transient  ischemic attack) 2011 X 2   Type II diabetes mellitus (HCC)    Urinary bladder incontinence    Vertigo     PAST SURGICAL HISTORY: Past Surgical History:  Procedure Laterality Date   APPENDECTOMY     BOTOX INJECTION N/A 11/23/2022   Procedure: CYSTOBOTOX INJECTION;  Surgeon: Alfredo Martinez, MD;  Location: [facility];  Service: Urology;  Laterality: N/A;   BREAST CYST ASPIRATION Bilateral    CARDIAC CATHETERIZATION N/A 12/24/2014   Procedure: Right/Left Heart Cath and Coronary Angiography;  Surgeon: Thurmon Fair, MD;  Location: [facility];  Service: Cardiovascular;  Laterality: N/A;   CATARACT EXTRACTION W/ INTRAOCULAR LENS  IMPLANT, BILATERAL Bilateral    EXCISIONAL HEMORRHOIDECTOMY     EYE SURGERY     FRACTURE SURGERY     IR THORACENTESIS ASP PLEURAL SPACE W/IMG GUIDE  05/13/2017   LAPAROSCOPIC CHOLECYSTECTOMY     LITHOTRIPSY  "several times"   RETINAL DETACHMENT SURGERY     "think it was on the left; not sure" (06/23/2017)   TONSILLECTOMY     VAGINAL HYSTERECTOMY     partial     ALLERGIES: Allergies  Allergen Reactions   Flagyl [Metronidazole Hcl] Itching and Rash   Ciprofloxacin Itching and Rash   Metformin And Related Rash   Methimazole Rash   Milk-Related Compounds Other (See Comments)    Stomach pains   Other Other (See Comments)    [place] nuts cause severe facial redness    FAMILY HISTORY:  Family History  Problem Relation Age of Onset   Heart disease Mother    Stroke Father    Parkinson's disease Father    Cancer Sister    Cancer Brother     SOCIAL HISTORY: Social History   Socioeconomic History   Marital status: Widowed    Spouse name: Not on file   Number of children: 2   Years of education: 48   Highest education level: Not on file  Occupational History   Occupation: Retired  Tobacco Use   Smoking status: Never   Smokeless tobacco: Never  Vaping Use   Vaping status: Never Used  Substance and Sexual Activity   Alcohol use: Never    Drug use: Never   Sexual activity: Not on file  Other Topics Concern   Not on file  Social History Narrative   Lives at home alone.   Right-handed.   No caffeine use.   Social Determinants of Health   Financial Resource Strain: Not on file  Food Insecurity: Not on file  Transportation Needs: Not on file  Physical Activity: Not on file  Stress: Not on file  Social Connections: Not on file    MEDICATIONS:  Current Outpatient Medications  Medication Sig Dispense Refill   Accu-Chek Softclix Lancets lancets Use 1-4 times daily  as needed/instructed  DX E11.9 360 each 3   apixaban (ELIQUIS) 5 MG TABS tablet Take 1 tablet (5 mg total) by mouth 2 (two) times daily. 60 tablet 6   aspirin EC 81 MG EC tablet Take 1 tablet (81 mg total) by mouth daily. 30 tablet 0   Blood Glucose Monitoring Suppl (ACCU-CHEK GUIDE) w/Device KIT Use 1-4 times daily as needed/directed  DX E11.9 1 kit 1   carbamazepine (CARBATROL) 200 MG 12 hr capsule Take 200 mg by mouth 2 (two) times daily.      Cholecalciferol (QC VITAMIN D3) 50 MCG (2000 UT) TABS Take 2,000 Units by mouth daily.     clobetasol (TEMOVATE) 0.05 % GEL Apply 1 application topically 2 (two) times daily as needed (rash).      Continuous Glucose Receiver (FREESTYLE LIBRE 3 READER) DEVI Use for sensor 1 each 0   Continuous Glucose Sensor (FREESTYLE LIBRE 2 SENSOR) MISC Use to check blood sugar. Change every 14 days 2 each 3   Continuous Glucose Sensor (FREESTYLE LIBRE 3 SENSOR) MISC 1 Device by Does not apply route every 14 (fourteen) days. Apply 1 sensor on upper arm every 14 days for continuous glucose monitoring 2 each 2   Continuous Glucose Sensor (FREESTYLE LIBRE 3 SENSOR) MISC 1 Device by Does not apply route every 14 (fourteen) days. Apply 1 sensor on upper arm every 14 days for continuous glucose monitoring 2 each 2   dapagliflozin propanediol (FARXIGA) 10 MG TABS tablet TAKE 1 TABLET(10 MG) BY MOUTH DAILY 90 tablet 2   diltiazem (CARDIZEM CD)  240 MG 24 hr capsule Take 1 capsule (240 mg total) by mouth daily. 90 capsule 1   donepezil (ARICEPT) 10 MG tablet Take 10 mg by mouth at bedtime.     esomeprazole (NEXIUM) 40 MG capsule Take 1 capsule (40 mg total) by mouth daily at 12 noon. 30 capsule 2   famotidine (PEPCID) 20 MG tablet Take 1 tablet (20 mg total) by mouth 2 (two) times daily. 60 tablet 1   folic acid (FOLVITE) 1 MG tablet Take 1 mg by mouth daily.     furosemide (LASIX) 40 MG tablet TAKE 1 TABLET BY MOUTH EVERY DAY 90 tablet 1   gabapentin (NEURONTIN) 300 MG capsule Take 300 mg by mouth 4 (four) times daily.     glucose blood (ACCU-CHEK GUIDE) test strip Use 1-4 times daily as needed/instructed DX E11.9 300 each 12   insulin aspart (NOVOLOG) 100 UNIT/ML injection INJECT 80 UNITS MAX PER DAY 30 mL 2   Insulin Aspart FlexPen (NOVOLOG) 100 UNIT/ML ADMINISTER 14 TO 16 UNITS UNDER THE SKIN BEFORE MEALS AS DIRECTED (Patient taking differently: Inject 14-16 Units into the skin 3 (three) times daily before meals.) 15 mL 0   Insulin Disposable Pump (V-GO 40) 40 UNIT/24HR KIT Apply 1 kit topically daily. 30 kit 5   Insulin Disposable Pump (V-GO 40) KIT USE 1 KIT DAILY AS DIRECTED 1 kit 5   Insulin Pen Needle (BD PEN NEEDLE NANO U/F) 32G X 4 MM MISC USE 8 PEN NEEDLES PER DAY 200 each 0   Insulin Pen Needle (COMFORT EZ PEN NEEDLES) 33G X 5 MM MISC 1 each by Does not apply route daily. Use with Tresiba pen 100 each 0   Insulin Pen Needle 32G X 5 MM MISC 1 each by Does not apply route daily. Use with Tresiba pen 100 each 0   levothyroxine (SYNTHROID, LEVOTHROID) 75 MCG tablet Take 75 mcg by mouth daily  before breakfast.     methotrexate (RHEUMATREX) 2.5 MG tablet Take 10 mg by mouth every Monday.     montelukast (SINGULAIR) 5 MG chewable tablet Chew 5 mg by mouth at bedtime.     OLANZapine (ZYPREXA) 15 MG tablet Take 15 mg by mouth at bedtime.     olmesartan (BENICAR) 20 MG tablet Take 20 mg by mouth daily.     olmesartan (BENICAR) 5 MG  tablet Take 1.5 tablets (7.5 mg total) by mouth daily.     ondansetron (ZOFRAN) 4 MG tablet Take 4 mg by mouth every 8 (eight) hours as needed for nausea or vomiting.     OneTouch Delica Lancets 33G MISC Use as directed 200 each 0   pravastatin (PRAVACHOL) 20 MG tablet Take 20 mg by mouth daily.     QUEtiapine (SEROQUEL) 100 MG tablet Take 100 mg by mouth at bedtime.     rosuvastatin (CRESTOR) 20 MG tablet Take 20 mg by mouth daily.     spironolactone (ALDACTONE) 25 MG tablet TAKE 1 TABLET BY MOUTH EVERY DAY 90 tablet 0   sulfamethoxazole-trimethoprim (BACTRIM DS) 800-160 MG tablet Take 1 tablet by mouth 2 (two) times daily. 6 tablet 0   tirzepatide (MOUNJARO) 10 MG/0.5ML Pen Inject 10 mg into the skin once a week. 6 mL 4   triamcinolone cream (KENALOG) 0.1 % Apply 1 Application topically as needed.     Vibegron 75 MG TABS Take 75 mg by mouth daily in the afternoon.     No current facility-administered medications for this visit.    PHYSICAL EXAM: Vitals:   12/03/22 0909  BP: (!) 150/62  Pulse: 87  Resp: 18  SpO2: 97%  Weight: 222 lb 9.6 oz (101 kg)  Height: 5\' 3"  (1.6 m)   Body mass index is 39.43 kg/m.  Wt Readings from Last 3 Encounters:  12/03/22 222 lb 9.6 oz (101 kg)  11/26/22 227 lb 6.4 oz (103.1 kg)  11/23/22 222 lb (100.7 kg)    General: Well developed, well nourished female in no apparent distress.  HEENT: AT/[place], no external lesions.  Eyes: Conjunctiva clear and no icterus. Neck: Neck supple  Lungs: Respirations not labored Neurologic: Alert, oriented, normal speech Extremities / Skin: Dry.  Psychiatric: Does not appear depressed or anxious  Diabetic Foot Exam - Simple   No data filed    LABS Reviewed Lab Results  Component Value Date   HGBA1C 7.0 (H) 11/25/2022   HGBA1C 6.7 (H) 11/11/2022   HGBA1C 7.7 (H) 08/13/2022   Lab Results  Component Value Date   FRUCTOSAMINE 267 05/28/2021   FRUCTOSAMINE 223 09/16/2020   FRUCTOSAMINE 259 10/17/2017   Lab  Results  Component Value Date   CHOL 149 03/26/2017   HDL 56 12/24/2019   LDLCALC 82 12/24/2019   TRIG 130 03/26/2017   CHOLHDL 3.0 03/26/2017   Lab Results  Component Value Date   MICRALBCREAT 2.2 08/13/2022   MICRALBCREAT 3.3 09/19/2020   Lab Results  Component Value Date   CREATININE 1.37 (H) 11/25/2022   Lab Results  Component Value Date   GFR 36.20 (L) 11/25/2022    ASSESSMENT / PLAN  1. Controlled type 2 diabetes mellitus with complication, with long-term current use of insulin (HCC)      Diabetes Mellitus type 2, complicated by peripheral neuropathy. - Diabetic status / severity: Uncontrolled.  Lab Results  Component Value Date   HGBA1C 7.0 (H) 11/25/2022    - Hemoglobin A1c goal : <7.5%  -  Medications:  Diabetes regimen -Continue V-Go pump 40 units basal same  - For  meals breakfast, lunch and supper 5-7 click depending upon meal size. For large meal take 7 click, regular meal take 6 click and smaller meal 5 clicks.  This will be 10 to 14 units with each meals.  -Continue Mounjaro 10 mg weekly.  -Continue Farxiga 10 mg daily.  - Home glucose testing: Freestyle libre 3 CGM and check as needed. - Discussed/ Gave Hypoglycemia treatment plan.  # Consult : not required at this time.   # Annual urine for microalbuminuria/ creatinine ratio, no microalbuminuria currently, continue ACE/ARB /olmesartan. Last  Lab Results  Component Value Date   MICRALBCREAT 2.2 08/13/2022    # Foot check nightly / neuropathy, continue gabapentin.  # Annual dilated diabetic eye exams.   - Diet: Make healthy diabetic food choices - Life style / activity / exercise: Discussed.  2. Blood pressure  -  BP Readings from Last 1 Encounters:  12/03/22 (!) 150/62    - Control is in target.  - No change in current plans.  Asked to monitor at home and if  stays high discussed with primary care provider.  3. Lipid status / Hyperlipidemia - Last  Lab Results  Component  Value Date   LDLCALC 82 12/24/2019   - Continue pravastatin 20 mg daily.  Managed by primary care provider.  Diagnoses and all orders for this visit:  Controlled type 2 diabetes mellitus with complication, with long-term current use of insulin (HCC) -     Basic metabolic panel; Future -     Hemoglobin A1c; Future     DISPOSITION Follow up in clinic in 3 months suggested.   All questions answered and patient verbalized understanding of the plan.  [place] Deondre Marinaro, MD [facility] Endocrinology [facility] [address] [place], [address] [place], [place] [address] Phone # [phone]  At least part of this note was generated using voice recognition software. Inadvertent word errors may have occurred, which were not recognized during the proofreading process.

## 2022-12-08 DIAGNOSIS — R3914 Feeling of incomplete bladder emptying: Secondary | ICD-10-CM | POA: Diagnosis not present

## 2022-12-08 DIAGNOSIS — N3946 Mixed incontinence: Secondary | ICD-10-CM | POA: Diagnosis not present

## 2022-12-08 DIAGNOSIS — N3944 Nocturnal enuresis: Secondary | ICD-10-CM | POA: Diagnosis not present

## 2022-12-23 DIAGNOSIS — E1165 Type 2 diabetes mellitus with hyperglycemia: Secondary | ICD-10-CM | POA: Diagnosis not present

## 2022-12-24 DIAGNOSIS — E782 Mixed hyperlipidemia: Secondary | ICD-10-CM | POA: Diagnosis not present

## 2022-12-24 DIAGNOSIS — I129 Hypertensive chronic kidney disease with stage 1 through stage 4 chronic kidney disease, or unspecified chronic kidney disease: Secondary | ICD-10-CM | POA: Diagnosis not present

## 2022-12-24 DIAGNOSIS — I130 Hypertensive heart and chronic kidney disease with heart failure and stage 1 through stage 4 chronic kidney disease, or unspecified chronic kidney disease: Secondary | ICD-10-CM | POA: Diagnosis not present

## 2022-12-24 DIAGNOSIS — E039 Hypothyroidism, unspecified: Secondary | ICD-10-CM | POA: Diagnosis not present

## 2022-12-24 DIAGNOSIS — N1831 Chronic kidney disease, stage 3a: Secondary | ICD-10-CM | POA: Diagnosis not present

## 2022-12-24 DIAGNOSIS — I5032 Chronic diastolic (congestive) heart failure: Secondary | ICD-10-CM | POA: Diagnosis not present

## 2022-12-24 DIAGNOSIS — E1165 Type 2 diabetes mellitus with hyperglycemia: Secondary | ICD-10-CM | POA: Diagnosis not present

## 2022-12-25 LAB — LAB REPORT - SCANNED
A1c: 6.8
Albumin, Urine POC: 8
Creatinine, POC: 35.5 mg/dL
EGFR: 51
Microalbumin, Urine: 0.3
TSH: 1.1 (ref 0.41–5.90)

## 2023-01-07 ENCOUNTER — Encounter (INDEPENDENT_AMBULATORY_CARE_PROVIDER_SITE_OTHER): Payer: Medicare PPO | Admitting: Ophthalmology

## 2023-01-07 ENCOUNTER — Ambulatory Visit: Payer: Medicare PPO | Admitting: Cardiovascular Disease

## 2023-01-07 DIAGNOSIS — H35033 Hypertensive retinopathy, bilateral: Secondary | ICD-10-CM | POA: Diagnosis not present

## 2023-01-07 DIAGNOSIS — Z794 Long term (current) use of insulin: Secondary | ICD-10-CM

## 2023-01-07 DIAGNOSIS — H338 Other retinal detachments: Secondary | ICD-10-CM

## 2023-01-07 DIAGNOSIS — I10 Essential (primary) hypertension: Secondary | ICD-10-CM | POA: Diagnosis not present

## 2023-01-07 DIAGNOSIS — H43813 Vitreous degeneration, bilateral: Secondary | ICD-10-CM | POA: Diagnosis not present

## 2023-01-07 DIAGNOSIS — E113393 Type 2 diabetes mellitus with moderate nonproliferative diabetic retinopathy without macular edema, bilateral: Secondary | ICD-10-CM

## 2023-01-21 ENCOUNTER — Other Ambulatory Visit: Payer: Self-pay | Admitting: Cardiovascular Disease

## 2023-01-27 DIAGNOSIS — N3946 Mixed incontinence: Secondary | ICD-10-CM | POA: Diagnosis not present

## 2023-01-27 DIAGNOSIS — R3914 Feeling of incomplete bladder emptying: Secondary | ICD-10-CM | POA: Diagnosis not present

## 2023-02-02 DIAGNOSIS — E782 Mixed hyperlipidemia: Secondary | ICD-10-CM | POA: Diagnosis not present

## 2023-02-02 DIAGNOSIS — Z794 Long term (current) use of insulin: Secondary | ICD-10-CM | POA: Diagnosis not present

## 2023-02-02 DIAGNOSIS — N1831 Chronic kidney disease, stage 3a: Secondary | ICD-10-CM | POA: Diagnosis not present

## 2023-02-02 DIAGNOSIS — I129 Hypertensive chronic kidney disease with stage 1 through stage 4 chronic kidney disease, or unspecified chronic kidney disease: Secondary | ICD-10-CM | POA: Diagnosis not present

## 2023-02-02 DIAGNOSIS — E1165 Type 2 diabetes mellitus with hyperglycemia: Secondary | ICD-10-CM | POA: Diagnosis not present

## 2023-02-02 DIAGNOSIS — R42 Dizziness and giddiness: Secondary | ICD-10-CM | POA: Diagnosis not present

## 2023-02-02 DIAGNOSIS — I130 Hypertensive heart and chronic kidney disease with heart failure and stage 1 through stage 4 chronic kidney disease, or unspecified chronic kidney disease: Secondary | ICD-10-CM | POA: Diagnosis not present

## 2023-02-02 DIAGNOSIS — I5032 Chronic diastolic (congestive) heart failure: Secondary | ICD-10-CM | POA: Diagnosis not present

## 2023-02-07 ENCOUNTER — Other Ambulatory Visit: Payer: Self-pay | Admitting: Endocrinology

## 2023-02-07 DIAGNOSIS — E1165 Type 2 diabetes mellitus with hyperglycemia: Secondary | ICD-10-CM

## 2023-02-07 NOTE — Telephone Encounter (Signed)
Insulin aspart/Novolog refill request complete

## 2023-02-09 ENCOUNTER — Encounter: Payer: Self-pay | Admitting: Endocrinology

## 2023-02-18 ENCOUNTER — Telehealth: Payer: Self-pay | Admitting: Endocrinology

## 2023-02-18 NOTE — Telephone Encounter (Signed)
Patient forwarded me with a letter from her insurance that her V-go pump is no longer cover and provided alternative medications including Cequr Q simplicity device or OmniPod 5.  She has appointment with me on February 28, she can continue to use V-Go pump until then if she has enough.  Will make a additional plan or alternative plan at the time of visit.  Please check with the patient if she has been on V-Go pump until February 28.  Mary Misao Fackrell, MD [facility] Endocrinology [facility] [address] [place], [address] [place], [place] [address] Phone # [phone]

## 2023-03-04 DIAGNOSIS — R42 Dizziness and giddiness: Secondary | ICD-10-CM | POA: Diagnosis not present

## 2023-03-04 DIAGNOSIS — I130 Hypertensive heart and chronic kidney disease with heart failure and stage 1 through stage 4 chronic kidney disease, or unspecified chronic kidney disease: Secondary | ICD-10-CM | POA: Diagnosis not present

## 2023-03-04 DIAGNOSIS — R251 Tremor, unspecified: Secondary | ICD-10-CM | POA: Diagnosis not present

## 2023-03-04 DIAGNOSIS — I5032 Chronic diastolic (congestive) heart failure: Secondary | ICD-10-CM | POA: Diagnosis not present

## 2023-03-04 DIAGNOSIS — E782 Mixed hyperlipidemia: Secondary | ICD-10-CM | POA: Diagnosis not present

## 2023-03-04 DIAGNOSIS — I129 Hypertensive chronic kidney disease with stage 1 through stage 4 chronic kidney disease, or unspecified chronic kidney disease: Secondary | ICD-10-CM | POA: Diagnosis not present

## 2023-03-04 DIAGNOSIS — E1165 Type 2 diabetes mellitus with hyperglycemia: Secondary | ICD-10-CM | POA: Diagnosis not present

## 2023-03-04 DIAGNOSIS — N1831 Chronic kidney disease, stage 3a: Secondary | ICD-10-CM | POA: Diagnosis not present

## 2023-03-04 DIAGNOSIS — Z794 Long term (current) use of insulin: Secondary | ICD-10-CM | POA: Diagnosis not present

## 2023-03-07 ENCOUNTER — Other Ambulatory Visit: Payer: Self-pay

## 2023-03-07 DIAGNOSIS — E118 Type 2 diabetes mellitus with unspecified complications: Secondary | ICD-10-CM

## 2023-03-11 ENCOUNTER — Other Ambulatory Visit: Payer: Medicare PPO

## 2023-03-15 ENCOUNTER — Encounter: Payer: Self-pay | Admitting: Endocrinology

## 2023-03-15 ENCOUNTER — Other Ambulatory Visit: Payer: Medicare PPO

## 2023-03-15 DIAGNOSIS — E118 Type 2 diabetes mellitus with unspecified complications: Secondary | ICD-10-CM | POA: Diagnosis not present

## 2023-03-15 DIAGNOSIS — Z794 Long term (current) use of insulin: Secondary | ICD-10-CM | POA: Diagnosis not present

## 2023-03-15 LAB — BASIC METABOLIC PANEL
BUN/Creatinine Ratio: 15 (calc) (ref 6–22)
BUN: 16 mg/dL (ref 7–25)
CO2: 28 mmol/L (ref 20–32)
Calcium: 9.1 mg/dL (ref 8.6–10.4)
Chloride: 95 mmol/L — ABNORMAL LOW (ref 98–110)
Creat: 1.06 mg/dL — ABNORMAL HIGH (ref 0.60–0.95)
Glucose, Bld: 157 mg/dL — ABNORMAL HIGH (ref 65–139)
Potassium: 4.2 mmol/L (ref 3.5–5.3)
Sodium: 133 mmol/L — ABNORMAL LOW (ref 135–146)

## 2023-03-15 LAB — HEMOGLOBIN: Hemoglobin: 13.7 g/dL (ref 11.7–15.5)

## 2023-03-17 ENCOUNTER — Other Ambulatory Visit: Payer: Self-pay

## 2023-03-17 DIAGNOSIS — L309 Dermatitis, unspecified: Secondary | ICD-10-CM | POA: Diagnosis not present

## 2023-03-17 DIAGNOSIS — L739 Follicular disorder, unspecified: Secondary | ICD-10-CM | POA: Diagnosis not present

## 2023-03-17 DIAGNOSIS — Q828 Other specified congenital malformations of skin: Secondary | ICD-10-CM | POA: Diagnosis not present

## 2023-03-17 DIAGNOSIS — D229 Melanocytic nevi, unspecified: Secondary | ICD-10-CM | POA: Diagnosis not present

## 2023-03-17 DIAGNOSIS — L299 Pruritus, unspecified: Secondary | ICD-10-CM | POA: Diagnosis not present

## 2023-03-17 DIAGNOSIS — D1801 Hemangioma of skin and subcutaneous tissue: Secondary | ICD-10-CM | POA: Diagnosis not present

## 2023-03-17 DIAGNOSIS — L821 Other seborrheic keratosis: Secondary | ICD-10-CM | POA: Diagnosis not present

## 2023-03-17 DIAGNOSIS — L814 Other melanin hyperpigmentation: Secondary | ICD-10-CM | POA: Diagnosis not present

## 2023-03-17 DIAGNOSIS — L578 Other skin changes due to chronic exposure to nonionizing radiation: Secondary | ICD-10-CM | POA: Diagnosis not present

## 2023-03-17 MED ORDER — APIXABAN 5 MG PO TABS: 5.0000 mg | ORAL_TABLET | Freq: Two times a day (BID) | ORAL | 6 refills | Status: DC

## 2023-03-17 NOTE — Telephone Encounter (Signed)
 Faxed refill request for Eliquis received from [facility].  Pt last saw Mary Fabian, NP on 11/26/22, last labs Creat 1.06, age 83, weight 101kg, based on specified criteria pt is on appropriate dosage of Eliquis 5mg  BID for aflutter.  Will refill rx.

## 2023-03-18 ENCOUNTER — Other Ambulatory Visit: Payer: Self-pay

## 2023-03-18 ENCOUNTER — Encounter: Payer: Self-pay | Admitting: Endocrinology

## 2023-03-18 ENCOUNTER — Ambulatory Visit: Payer: Medicare PPO | Admitting: Endocrinology

## 2023-03-18 VITALS — BP 124/68 | HR 76 | Ht 63.0 in | Wt 223.0 lb

## 2023-03-18 DIAGNOSIS — E1165 Type 2 diabetes mellitus with hyperglycemia: Secondary | ICD-10-CM

## 2023-03-18 DIAGNOSIS — Z794 Long term (current) use of insulin: Secondary | ICD-10-CM

## 2023-03-18 DIAGNOSIS — E119 Type 2 diabetes mellitus without complications: Secondary | ICD-10-CM

## 2023-03-18 LAB — POCT GLYCOSYLATED HEMOGLOBIN (HGB A1C): Hemoglobin A1C: 7.1 % — AB (ref 4.0–5.6)

## 2023-03-18 MED ORDER — BD PEN NEEDLE NANO U/F 32G X 4 MM MISC: 0 refills | Status: DC

## 2023-03-18 MED ORDER — V-GO 40 40 UNIT/24HR KIT: 1.0000 | PACK | Freq: Every day | 5 refills | Status: DC

## 2023-03-18 NOTE — Progress Notes (Signed)
 Outpatient Endocrinology Note [place] Yatziry Deakins, MD  03/18/23  Patient's Name: [name]    DOB: Mar 03, 1940    MRN: [id number]                                                    REASON OF VISIT: Follow up for type 2 diabetes mellitus  PCP: Georgianne Fick, MD  HISTORY OF PRESENT ILLNESS:   [name] is a 83 y.o. old female with past medical history listed below, is here for follow up of type 2 diabetes mellitus.   Pertinent Diabetes History: Patient was diagnosed with type 2 diabetes mellitus in 2011.  She was apparently intolerant to metformin and was treated with Onglyza until about 2014.  Insulin therapy was started around 2014 initially on basal insulin and later mealtime insulin was added.  She has been on V-Go insulin pump since 09/2014.   Chronic Diabetes Complications : Retinopathy: no. Last ophthalmology exam was done on annually, reportedly.  Nephropathy: no, on olmesartan Peripheral neuropathy: yes, painful paresthesia, on gabapentin. Coronary artery disease: no Stroke: yes, TIA  Relevant comorbidities and cardiovascular risk factors: Obesity: yes Body mass index is 39.5 kg/m.  Hypertension: yes Hyperlipidemia. Yes,on statin  Current / Home Diabetic regimen includes: V-Go pump 40 units basal, meal times with 10 - 14 units with breakfast, lunch and dinner. Farxiga 10 mg daily. Mounjaro 10 mg weekly.  For  meals breakfast, lunch and supper 5-7 click depending upon meal size. For large meal take 7 click, regular meal take 6 click and smaller meal 5 clicks.  This will be 10 to 14 units with each meals.  Prior diabetic medications: Metformin - intolerance.  Onglyza  Glycemic data:    CONTINUOUS GLUCOSE MONITORING SYSTEM (CGMS) INTERPRETATION: At today's visit, we reviewed CGM downloads. The full report is scanned in the media. Reviewing the CGM trends, blood glucose are as follows:  FreeStyle Libre 3 CGM-  Sensor Download (Sensor download was  reviewed and summarized below.) Dates: February 15 to March 18, 2023, 14 days Sensor Average: 153 Glucose Management Indicator: 7.2%  % data captured:96%    Interpretation: Most of the blood sugar are acceptable.  She has occasional hyperglycemia with blood sugar up to 260s especially with breakfast and rarely with supper.  Most of the other times acceptable blood sugar.  Blood sugar overnight are acceptable.  No hypoglycemia.  Hypoglycemia: Patient has no significant hypoglycemic episodes. Patient has hypoglycemia awareness.  Factors modifying glucose control: 1.  Diabetic diet assessment: three meals a day.  2.  Staying active or exercising: no formal exercise. Walks indoor.   3.  Medication compliance: compliant all of the time.  # Primary hypothyroidism : for several years, on levothyroxine 75 mcg daily,  managed by PCP.   Interval history  CGM data as reviewed above.  Hemoglobin A1c 7.1%.  She has no complaints today.  Few days ago patient forwarded me with a letter from her insurance that her V-go pump is no longer cover and provided alternative medications including Cequr Q simplicity device or OmniPod 5.  Patient is still has some V-Go pump for few days at home.  No later available to review in the clinic today.  Patient reports that she may not be able to refill V-Go pump from next month.  REVIEW OF SYSTEMS As  per history of present illness.   PAST MEDICAL HISTORY: Past Medical History:  Diagnosis Date   Bipolar disorder (HCC)    CHF (congestive heart failure) (HCC)    Depression    Bipolar   Dysrhythmia    GERD (gastroesophageal reflux disease)    History of hiatal hernia    History of kidney stones    History of stomach ulcers 1970s   "bleeding"   Hyperlipidemia    Hypertension    Hypothyroidism    Ischemic colitis, enteritis, or enterocolitis (HCC)    OSA on CPAP    Pericarditis 05/06/2017   Pneumonia    "several times" (06/23/2017)   Thyroid  disease    TIA (transient ischemic attack) 2011 X 2   Type II diabetes mellitus (HCC)    Urinary bladder incontinence    Vertigo     PAST SURGICAL HISTORY: Past Surgical History:  Procedure Laterality Date   APPENDECTOMY     BOTOX INJECTION N/A 11/23/2022   Procedure: CYSTOBOTOX INJECTION;  Surgeon: Alfredo Martinez, MD;  Location: [facility];  Service: Urology;  Laterality: N/A;   BREAST CYST ASPIRATION Bilateral    CARDIAC CATHETERIZATION N/A 12/24/2014   Procedure: Right/Left Heart Cath and Coronary Angiography;  Surgeon: Thurmon Fair, MD;  Location: [facility];  Service: Cardiovascular;  Laterality: N/A;   CATARACT EXTRACTION W/ INTRAOCULAR LENS  IMPLANT, BILATERAL Bilateral    EXCISIONAL HEMORRHOIDECTOMY     EYE SURGERY     FRACTURE SURGERY     IR THORACENTESIS ASP PLEURAL SPACE W/IMG GUIDE  05/13/2017   LAPAROSCOPIC CHOLECYSTECTOMY     LITHOTRIPSY  "several times"   RETINAL DETACHMENT SURGERY     "think it was on the left; not sure" (06/23/2017)   TONSILLECTOMY     VAGINAL HYSTERECTOMY     partial     ALLERGIES: Allergies  Allergen Reactions   Flagyl [Metronidazole Hcl] Itching and Rash   Ciprofloxacin Itching and Rash   Metformin And Related Rash   Methimazole Rash   Milk-Related Compounds Other (See Comments)    Stomach pains   Other Other (See Comments)    [place] nuts cause severe facial redness    FAMILY HISTORY:  Family History  Problem Relation Age of Onset   Heart disease Mother    Stroke Father    Parkinson's disease Father    Cancer Sister    Cancer Brother     SOCIAL HISTORY: Social History   Socioeconomic History   Marital status: Widowed    Spouse name: Not on file   Number of children: 2   Years of education: 41   Highest education level: Not on file  Occupational History   Occupation: Retired  Tobacco Use   Smoking status: Never   Smokeless tobacco: Never  Vaping Use   Vaping status: Never Used  Substance and Sexual Activity    Alcohol use: Never   Drug use: Never   Sexual activity: Not on file  Other Topics Concern   Not on file  Social History Narrative   Lives at home alone.   Right-handed.   No caffeine use.   Social Drivers of Corporate investment banker Strain: Not on file  Food Insecurity: Not on file  Transportation Needs: Not on file  Physical Activity: Not on file  Stress: Not on file  Social Connections: Not on file    MEDICATIONS:  Current Outpatient Medications  Medication Sig Dispense Refill   apixaban (ELIQUIS) 5 MG TABS  tablet Take 1 tablet (5 mg total) by mouth 2 (two) times daily. 60 tablet 6   AREXVY 120 MCG/0.5ML injection      aspirin EC 81 MG EC tablet Take 1 tablet (81 mg total) by mouth daily. 30 tablet 0   carbamazepine (CARBATROL) 200 MG 12 hr capsule Take 200 mg by mouth 2 (two) times daily.      Cholecalciferol (QC VITAMIN D3) 50 MCG (2000 UT) TABS Take 2,000 Units by mouth daily.     clobetasol (TEMOVATE) 0.05 % GEL Apply 1 application topically 2 (two) times daily as needed (rash).      Continuous Glucose Receiver (FREESTYLE LIBRE 3 READER) DEVI Use for sensor 1 each 0   Continuous Glucose Sensor (FREESTYLE LIBRE 3 SENSOR) MISC 1 Device by Does not apply route every 14 (fourteen) days. Apply 1 sensor on upper arm every 14 days for continuous glucose monitoring 2 each 2   Continuous Glucose Sensor (FREESTYLE LIBRE 3 SENSOR) MISC 1 Device by Does not apply route every 14 (fourteen) days. Apply 1 sensor on upper arm every 14 days for continuous glucose monitoring 2 each 2   dapagliflozin propanediol (FARXIGA) 10 MG TABS tablet TAKE 1 TABLET(10 MG) BY MOUTH DAILY 90 tablet 2   diltiazem (CARDIZEM CD) 240 MG 24 hr capsule TAKE 1 CAPSULE(240 MG) BY MOUTH DAILY 90 capsule 1   donepezil (ARICEPT) 10 MG tablet Take 10 mg by mouth at bedtime.     esomeprazole (NEXIUM) 40 MG capsule Take 1 capsule (40 mg total) by mouth daily at 12 noon. 30 capsule 2   famotidine (PEPCID) 20 MG  tablet Take 1 tablet (20 mg total) by mouth 2 (two) times daily. 60 tablet 1   folic acid (FOLVITE) 1 MG tablet Take 1 mg by mouth daily.     furosemide (LASIX) 40 MG tablet TAKE 1 TABLET BY MOUTH EVERY DAY 90 tablet 1   gabapentin (NEURONTIN) 300 MG capsule Take 300 mg by mouth 4 (four) times daily.     HYDROcodone-acetaminophen (NORCO/VICODIN) 5-325 MG tablet Take 1 tablet by mouth every 6 (six) hours as needed.     Insulin Aspart FlexPen (NOVOLOG) 100 UNIT/ML ADMINISTER 14 TO 16 UNITS UNDER THE SKIN BEFORE MEALS AS DIRECTED (Patient taking differently: Inject 14-16 Units into the skin 3 (three) times daily before meals.) 15 mL 0   Insulin Disposable Pump (V-GO 40) KIT USE 1 KIT DAILY AS DIRECTED 1 kit 5   Insulin Pen Needle (COMFORT EZ PEN NEEDLES) 33G X 5 MM MISC 1 each by Does not apply route daily. Use with Tresiba pen 100 each 0   Insulin Pen Needle 32G X 5 MM MISC 1 each by Does not apply route daily. Use with Tresiba pen 100 each 0   levothyroxine (SYNTHROID, LEVOTHROID) 75 MCG tablet Take 75 mcg by mouth daily before breakfast.     montelukast (SINGULAIR) 5 MG chewable tablet Chew 5 mg by mouth at bedtime.     OLANZapine (ZYPREXA) 15 MG tablet Take 15 mg by mouth at bedtime.     olmesartan (BENICAR) 20 MG tablet Take 20 mg by mouth daily.     olmesartan (BENICAR) 5 MG tablet Take 1.5 tablets (7.5 mg total) by mouth daily.     ondansetron (ZOFRAN) 4 MG tablet Take 4 mg by mouth every 8 (eight) hours as needed for nausea or vomiting.     pravastatin (PRAVACHOL) 20 MG tablet Take 20 mg by mouth daily.  QUEtiapine (SEROQUEL) 100 MG tablet Take 100 mg by mouth at bedtime.     rosuvastatin (CRESTOR) 20 MG tablet Take 20 mg by mouth daily.     spironolactone (ALDACTONE) 25 MG tablet TAKE 1 TABLET BY MOUTH EVERY DAY 90 tablet 0   TIADYLT ER 180 MG 24 hr capsule TAKE 1 CAPSULE BY MOUTH DAILY for 30     tirzepatide (MOUNJARO) 10 MG/0.5ML Pen Inject 10 mg into the skin once a week. 6 mL 4    triamcinolone cream (KENALOG) 0.1 % Apply 1 Application topically as needed.     Vibegron 75 MG TABS Take 75 mg by mouth daily in the afternoon.     Accu-Chek Softclix Lancets lancets Use 1-4 times daily as needed/instructed  DX E11.9 (Patient not taking: Reported on 03/18/2023) 360 each 3   Blood Glucose Monitoring Suppl (ACCU-CHEK GUIDE) w/Device KIT Use 1-4 times daily as needed/directed  DX E11.9 (Patient not taking: Reported on 03/18/2023) 1 kit 1   Continuous Glucose Sensor (FREESTYLE LIBRE 2 SENSOR) MISC Use to check blood sugar. Change every 14 days (Patient not taking: Reported on 03/18/2023) 2 each 3   glucose blood (ACCU-CHEK GUIDE) test strip Use 1-4 times daily as needed/instructed DX E11.9 (Patient not taking: Reported on 03/18/2023) 300 each 12   insulin aspart (NOVOLOG) 100 UNIT/ML injection INJECT 80 UNITS EVERY DAY (Patient not taking: Reported on 03/18/2023) 30 mL 2   Insulin Disposable Pump (V-GO 40) 40 UNIT/24HR KIT Apply 1 kit topically daily. 30 kit 5   Insulin Pen Needle (BD PEN NEEDLE NANO U/F) 32G X 4 MM MISC USE 8 PEN NEEDLES PER DAY 200 each 0   methotrexate (RHEUMATREX) 2.5 MG tablet Take 10 mg by mouth every Monday.     OneTouch Delica Lancets 33G MISC Use as directed (Patient not taking: Reported on 03/18/2023) 200 each 0   sulfamethoxazole-trimethoprim (BACTRIM DS) 800-160 MG tablet Take 1 tablet by mouth 2 (two) times daily. (Patient not taking: Reported on 03/18/2023) 6 tablet 0   No current facility-administered medications for this visit.    PHYSICAL EXAM: Vitals:   03/18/23 0852  BP: 124/68  Pulse: 76  SpO2: 97%  Weight: 223 lb (101.2 kg)  Height: 5\' 3"  (1.6 m)   Body mass index is 39.5 kg/m.  Wt Readings from Last 3 Encounters:  03/18/23 223 lb (101.2 kg)  12/03/22 222 lb 9.6 oz (101 kg)  11/26/22 227 lb 6.4 oz (103.1 kg)    General: Well developed, well nourished female in no apparent distress.  HEENT: AT/[place], no external lesions.  Eyes: Conjunctiva  clear and no icterus. Neck: Neck supple  Lungs: Respirations not labored Neurologic: Alert, oriented, normal speech Extremities / Skin: Dry.  Psychiatric: Does not appear depressed or anxious  Diabetic Foot Exam - Simple   Simple Foot Form Visual Inspection No deformities, no ulcerations, no other skin breakdown bilaterally: Yes Sensation Testing Intact to touch and monofilament testing bilaterally: Yes Pulse Check See comments: Yes Comments DP feeble, difficult to palpate due to subcutaneous tissue.     LABS Reviewed Lab Results  Component Value Date   HGBA1C 7.1 (A) 03/18/2023   HGBA1C 7.0 (H) 11/25/2022   HGBA1C 6.7 (H) 11/11/2022   Lab Results  Component Value Date   FRUCTOSAMINE 267 05/28/2021   FRUCTOSAMINE 223 09/16/2020   FRUCTOSAMINE 259 10/17/2017   Lab Results  Component Value Date   CHOL 149 03/26/2017   HDL 56 12/24/2019   LDLCALC 82 12/24/2019  TRIG 130 03/26/2017   CHOLHDL 3.0 03/26/2017   Lab Results  Component Value Date   MICRALBCREAT 2.2 08/13/2022   MICRALBCREAT 3.3 09/19/2020   Lab Results  Component Value Date   CREATININE 1.06 (H) 03/15/2023   Lab Results  Component Value Date   GFR 36.20 (L) 11/25/2022    ASSESSMENT / PLAN  1. Uncontrolled type 2 diabetes mellitus with hyperglycemia, with long-term current use of insulin (HCC)     Diabetes Mellitus type 2, complicated by peripheral neuropathy. - Diabetic status / severity: Fair control.  Lab Results  Component Value Date   HGBA1C 7.1 (A) 03/18/2023    - Hemoglobin A1c goal : <7.5%  Overall acceptable control in blood sugars, based on CGM data.  Patient's medical insurance, no longer covering V-Go pump, provided option for Cequr Q simplicity device or OmniPod 5.  Discussed options with patient : multidose insulin regimen versus OmniPod 5 vs Cequr Q simplicity device.  Patient felt like using OmniPod 5 would be too much for her to handle.  She rather not prefer  multidose insulin regimen.  Will plan with Cequr Q simplicity device and basal insulin, after the diabetic educator visit/training for Cegur Q simplicity device.  After the diabetes educator visit if she feels comfortable using OmniPod 5 we can consider that option as well.  For now we will continue with following regimen with V-Go pump.  - Medications:  Diabetes regimen -Continue V-Go pump 40 units basal same. - For  meals breakfast, lunch and supper 5-7 click depending upon meal size. For large meal take 7 click, regular meal take 6 click and smaller meal 5 clicks.  This will be 10 to 14 units with each meals.  -Continue Mounjaro 10 mg weekly.  -Continue Farxiga 10 mg daily.  - Home glucose testing: Freestyle libre 3 CGM and check as needed. - Discussed/ Gave Hypoglycemia treatment plan.  # Consult : Sent referral to diabetes educator.   # Annual urine for microalbuminuria/ creatinine ratio, no microalbuminuria currently, continue ACE/ARB /olmesartan. Last  Lab Results  Component Value Date   MICRALBCREAT 2.2 08/13/2022    # Foot check nightly / neuropathy, continue gabapentin.  # Annual dilated diabetic eye exams.   - Diet: Make healthy diabetic food choices - Life style / activity / exercise: Discussed.  2. Blood pressure  -  BP Readings from Last 1 Encounters:  03/18/23 124/68    - Control is in target.  - No change in current plans.   3. Lipid status / Hyperlipidemia - Last  Lab Results  Component Value Date   LDLCALC 82 12/24/2019   - Continue pravastatin 20 mg daily.  Managed by primary care provider.  [name] was seen today for follow-up.  Diagnoses and all orders for this visit:  Uncontrolled type 2 diabetes mellitus with hyperglycemia, with long-term current use of insulin (HCC) -     POCT glycosylated hemoglobin (Hb A1C) -     Insulin Disposable Pump (V-GO 40) 40 UNIT/24HR KIT; Apply 1 kit topically daily. -     Amb Referral to Nutrition and  Diabetic Education     DISPOSITION Follow up in clinic in 6 weeks suggested.   All questions answered and patient verbalized understanding of the plan.  [place] Fatuma Dowers, MD [facility] [name] [place], [address] [place], [place] [address] Phone # [phone]  At least part of this note was generated using voice recognition software. Inadvertent word errors may have  occurred, which were not recognized during the proofreading process.

## 2023-03-25 DIAGNOSIS — E1165 Type 2 diabetes mellitus with hyperglycemia: Secondary | ICD-10-CM | POA: Diagnosis not present

## 2023-04-05 ENCOUNTER — Telehealth: Payer: Self-pay

## 2023-04-05 NOTE — Telephone Encounter (Signed)
 I had referred to diabetes educator Bonita Quin, and the most recent visit in February 28 to learn about Cequr Q simplicity device and possibly OmniPod 5.  Ask patient to have diabetic educator visit, then will decide on the diabetes regimen for the replacement of her current V-Go pump.  Please check with the patient how long her V- pump will last with the current supplies?  [place] Bladimir Auman, MD [facility] [address] [place], [address] [place], [place] [address] Phone # [phone]

## 2023-04-05 NOTE — Telephone Encounter (Signed)
 Pt call regarding need a replacement for insulin pump

## 2023-04-07 ENCOUNTER — Telehealth: Payer: Self-pay | Admitting: Dietician

## 2023-04-07 NOTE — Telephone Encounter (Signed)
 Patient called to get a prescription for something to replace her V-go as this is no longer covered by insurance.  Chart reviewed.  Dr. Erroll Luna wishes for her to see the diabetes educator about a CeQur vs. Omnipod for further treatment of her diabetes.  Appointment made.  Oran Rein, RD, LDN, CDCES, DipACLM

## 2023-04-13 ENCOUNTER — Ambulatory Visit: Payer: Medicare PPO | Attending: Cardiovascular Disease | Admitting: Cardiovascular Disease

## 2023-04-13 ENCOUNTER — Encounter: Payer: Self-pay | Admitting: Cardiovascular Disease

## 2023-04-13 VITALS — BP 132/80 | HR 80 | Ht 63.0 in | Wt 214.2 lb

## 2023-04-13 DIAGNOSIS — I5032 Chronic diastolic (congestive) heart failure: Secondary | ICD-10-CM | POA: Diagnosis not present

## 2023-04-13 DIAGNOSIS — Z794 Long term (current) use of insulin: Secondary | ICD-10-CM

## 2023-04-13 DIAGNOSIS — E782 Mixed hyperlipidemia: Secondary | ICD-10-CM | POA: Diagnosis not present

## 2023-04-13 DIAGNOSIS — I10 Essential (primary) hypertension: Secondary | ICD-10-CM

## 2023-04-13 DIAGNOSIS — G4733 Obstructive sleep apnea (adult) (pediatric): Secondary | ICD-10-CM

## 2023-04-13 DIAGNOSIS — I4892 Unspecified atrial flutter: Secondary | ICD-10-CM

## 2023-04-13 DIAGNOSIS — E119 Type 2 diabetes mellitus without complications: Secondary | ICD-10-CM | POA: Diagnosis not present

## 2023-04-13 DIAGNOSIS — Z8679 Personal history of other diseases of the circulatory system: Secondary | ICD-10-CM

## 2023-04-13 NOTE — Patient Instructions (Signed)
 Medication Instructions:  Stop Aspirin *If you need a refill on your cardiac medications before your next appointment, please call your pharmacy*  Follow-Up: At [facility], you and your health needs are our priority.  As part of our continuing mission to provide you with exceptional heart care, we have created designated Provider Care Teams.  These Care Teams include your primary Cardiologist (physician) and Advanced Practice Providers (APPs -  Physician Assistants and Nurse Practitioners) who all work together to provide you with the care you need, when you need it.  We recommend signing up for the patient portal called "MyChart".  Sign up information is provided on this After Visit Summary.  MyChart is used to connect with patients for Virtual Visits (Telemedicine).  Patients are able to view lab/test results, encounter notes, upcoming appointments, etc.  Non-urgent messages can be sent to your provider as well.   To learn more about what you can do with MyChart, go to [email or web address].    Your next appointment:   1 year(s)  Provider:   Thurmon Fair, MD

## 2023-04-13 NOTE — Progress Notes (Signed)
 Cardiology Office Note    Date:  04/20/2023   ID:  [name], DOB Mar 25, 1940, MRN [id number]  PCP:  Georgianne Fick, MD  Cardiologist:   Thurmon Fair, MD   No chief complaint on file.   History of Present Illness:  [name] is a 83 y.o. female with obesity, type 2 diabetes mellitus, history of atrial flutter, history of acute pericarditis, previous history of TIA and possible ischemic colitis, hypertension. She has normal left ventricular systolic function and regional wall motion without valvular abnormalities.  She had a protracted course of acute pericarditis throughout the first half of 2019.  Despite consistently normal levels of BNP, she does have intermittent shortness of breath and lower extremity edema that responds well to diuretic therapy.  However, treatment with combined metolazone-loop diuretics has led to significant hypovolemia and acute kidney injury.  Generally feels that she is doing well.  She continues to have problems with lower extremity edema despite the fact that she wears compression stockings.  Occasionally has weeping from the legs and then has to boost her diuretic dose.  Does not have orthopnea or PND or significant exertional dyspnea (chronic NYHA functional class II).  Denies chest pain.  Has palpitations at least once a week, sometimes even daily, but they last less than a minute.  They do not associate dizziness or syncope.  She has not had any new focal neurological complaints but has issues with tardive dyskinesia from her neuroleptic agents.  She has not lost any additional weight since her last appointment but is holding steady at 214 pounds, BMI just under 38 which is a big improvement compared to a few years ago.  There is still some uncertainty as to what her optimal "dry weight" is.  She has had issues with occasional AKI when we have pushed her diuretics.  Her most recent creatinine was normal at 1.06 last month.  Electrolytes were  normal as well.  Metabolic control is good with a hemoglobin A1c of 7.1% and LDL cholesterol of 58, labs performed last December.  She was started on Eliquis after an episode of atrial flutter detected by ECG 09/21/2022.  Careful review of that ECG tracing actually shows that she has sinus rhythm with motion artifact (see lead I in leads V3-V4).  However, palpitations have been a longstanding complaint and she has numerous risk factors for atrial fibrillation.  She has a history of TIA.  Cardiac MRI in April 2019 showed normal left ventricular systolic function with mild LVH and no evidence of late gadolinium enhancement.  Echo on August 10, 2017 showed complete resolution of the pericardial effusion.  The most recent echo in May 2022 showed a hyperdynamic LV with EF of 70-75%, moderate concentric LVH and grade 1 diastolic dysfunction without any pericardial effusion or valvular abnormalities.  She had normal coronary arteries by catheterization in 2016. Previous CT of the abdomen and pelvis showed fatty liver changes, constipation and diverticulosis, but was otherwise benign.    Past Surgical History:  Procedure Laterality Date   APPENDECTOMY     BOTOX INJECTION N/A 11/23/2022   Procedure: CYSTOBOTOX INJECTION;  Surgeon: Alfredo Martinez, MD;  Location: [facility];  Service: Urology;  Laterality: N/A;   BREAST CYST ASPIRATION Bilateral    CARDIAC CATHETERIZATION N/A 12/24/2014   Procedure: Right/Left Heart Cath and Coronary Angiography;  Surgeon: Thurmon Fair, MD;  Location: [facility];  Service: Cardiovascular;  Laterality: N/A;   CATARACT EXTRACTION W/ INTRAOCULAR LENS  IMPLANT, BILATERAL Bilateral    EXCISIONAL HEMORRHOIDECTOMY     EYE SURGERY     FRACTURE SURGERY     IR THORACENTESIS ASP PLEURAL SPACE W/IMG GUIDE  05/13/2017   LAPAROSCOPIC CHOLECYSTECTOMY     LITHOTRIPSY  "several times"   RETINAL DETACHMENT SURGERY     "think it was on the left; not sure" (06/23/2017)   TONSILLECTOMY      VAGINAL HYSTERECTOMY     partial     Current Medications: Outpatient Medications Prior to Visit  Medication Sig Dispense Refill   Accu-Chek Softclix Lancets lancets Use 1-4 times daily as needed/instructed  DX E11.9 360 each 3   apixaban (ELIQUIS) 5 MG TABS tablet Take 1 tablet (5 mg total) by mouth 2 (two) times daily. 60 tablet 6   Blood Glucose Monitoring Suppl (ACCU-CHEK GUIDE) w/Device KIT Use 1-4 times daily as needed/directed  DX E11.9 1 kit 1   carbamazepine (CARBATROL) 200 MG 12 hr capsule Take 200 mg by mouth 2 (two) times daily.      Cholecalciferol (QC VITAMIN D3) 50 MCG (2000 UT) TABS Take 2,000 Units by mouth daily.     clobetasol (TEMOVATE) 0.05 % GEL Apply 1 application topically 2 (two) times daily as needed (rash).      Continuous Glucose Receiver (FREESTYLE LIBRE 3 READER) DEVI Use for sensor 1 each 0   Continuous Glucose Sensor (FREESTYLE LIBRE 3 SENSOR) MISC 1 Device by Does not apply route every 14 (fourteen) days. Apply 1 sensor on upper arm every 14 days for continuous glucose monitoring 2 each 2   Continuous Glucose Sensor (FREESTYLE LIBRE 3 SENSOR) MISC 1 Device by Does not apply route every 14 (fourteen) days. Apply 1 sensor on upper arm every 14 days for continuous glucose monitoring 2 each 2   dapagliflozin propanediol (FARXIGA) 10 MG TABS tablet TAKE 1 TABLET(10 MG) BY MOUTH DAILY 90 tablet 2   diltiazem (CARDIZEM CD) 240 MG 24 hr capsule TAKE 1 CAPSULE(240 MG) BY MOUTH DAILY 90 capsule 1   donepezil (ARICEPT) 10 MG tablet Take 10 mg by mouth at bedtime.     esomeprazole (NEXIUM) 40 MG capsule Take 1 capsule (40 mg total) by mouth daily at 12 noon. 30 capsule 2   famotidine (PEPCID) 20 MG tablet Take 1 tablet (20 mg total) by mouth 2 (two) times daily. 60 tablet 1   folic acid (FOLVITE) 1 MG tablet Take 1 mg by mouth daily.     furosemide (LASIX) 40 MG tablet TAKE 1 TABLET BY MOUTH EVERY DAY 90 tablet 1   gabapentin (NEURONTIN) 300 MG capsule Take 300 mg by  mouth 4 (four) times daily. Patient takes 1 capsule in the am, 1 capsule in the afternoon and two capsules in the evening     glucose blood (ACCU-CHEK GUIDE) test strip Use 1-4 times daily as needed/instructed DX E11.9 300 each 12   insulin aspart (NOVOLOG) 100 UNIT/ML injection INJECT 80 UNITS EVERY DAY 30 mL 2   Insulin Aspart FlexPen (NOVOLOG) 100 UNIT/ML ADMINISTER 14 TO 16 UNITS UNDER THE SKIN BEFORE MEALS AS DIRECTED (Patient taking differently: Inject 14-16 Units into the skin 3 (three) times daily before meals.) 15 mL 0   Insulin Pen Needle (BD PEN NEEDLE NANO U/F) 32G X 4 MM MISC USE 8 PEN NEEDLES PER DAY 200 each 0   Insulin Pen Needle (COMFORT EZ PEN NEEDLES) 33G X 5 MM MISC 1 each by Does not apply route daily. Use with [place] pen  100 each 0   Insulin Pen Needle 32G X 5 MM MISC 1 each by Does not apply route daily. Use with [place] pen 100 each 0   levothyroxine (SYNTHROID, LEVOTHROID) 75 MCG tablet Take 75 mcg by mouth daily before breakfast.     montelukast (SINGULAIR) 5 MG chewable tablet Chew 5 mg by mouth at bedtime.     OLANZapine (ZYPREXA) 10 MG tablet Take 10 mg by mouth at bedtime.     olmesartan (BENICAR) 20 MG tablet Take 20 mg by mouth daily.     OneTouch Delica Lancets 33G MISC Use as directed 200 each 0   pravastatin (PRAVACHOL) 20 MG tablet Take 20 mg by mouth daily.     QUEtiapine (SEROQUEL) 100 MG tablet Take 100 mg by mouth at bedtime.     rosuvastatin (CRESTOR) 20 MG tablet Take 20 mg by mouth daily.     spironolactone (ALDACTONE) 25 MG tablet TAKE 1 TABLET BY MOUTH EVERY DAY 90 tablet 0   sulfamethoxazole-trimethoprim (BACTRIM DS) 800-160 MG tablet Take 1 tablet by mouth 2 (two) times daily. 6 tablet 0   tirzepatide (MOUNJARO) 10 MG/0.5ML Pen Inject 10 mg into the skin once a week. 6 mL 4   triamcinolone cream (KENALOG) 0.1 % Apply 1 Application topically as needed.     Vibegron 75 MG TABS Take 75 mg by mouth daily in the afternoon.     aspirin EC 81 MG EC tablet  Take 1 tablet (81 mg total) by mouth daily. 30 tablet 0   olmesartan (BENICAR) 5 MG tablet Take 1.5 tablets (7.5 mg total) by mouth daily.     TIADYLT ER 180 MG 24 hr capsule TAKE 1 CAPSULE BY MOUTH DAILY for 30     AREXVY 120 MCG/0.5ML injection  (Patient not taking: Reported on 04/13/2023)     Continuous Glucose Sensor (FREESTYLE LIBRE 2 SENSOR) MISC Use to check blood sugar. Change every 14 days (Patient not taking: Reported on 03/18/2023) 2 each 3   HYDROcodone-acetaminophen (NORCO/VICODIN) 5-325 MG tablet Take 1 tablet by mouth every 6 (six) hours as needed. (Patient not taking: Reported on 04/13/2023)     Insulin Disposable Pump (V-GO 40) 40 UNIT/24HR KIT Apply 1 kit topically daily. (Patient not taking: Reported on 04/13/2023) 30 kit 5   Insulin Disposable Pump (V-GO 40) KIT USE 1 KIT DAILY AS DIRECTED (Patient not taking: Reported on 04/13/2023) 1 kit 5   methotrexate (RHEUMATREX) 2.5 MG tablet Take 10 mg by mouth every Monday. (Patient not taking: Reported on 04/13/2023)     OLANZapine (ZYPREXA) 15 MG tablet Take 15 mg by mouth at bedtime. (Patient not taking: Reported on 04/13/2023)     ondansetron (ZOFRAN) 4 MG tablet Take 4 mg by mouth every 8 (eight) hours as needed for nausea or vomiting. (Patient not taking: Reported on 04/13/2023)     No facility-administered medications prior to visit.     Allergies:   Flagyl [metronidazole hcl], Ciprofloxacin, Metformin and related, Methimazole, Milk-related compounds, and Other     Family History:  The patient's family history includes Cancer in her brother and sister; Heart disease in her mother; Parkinson's disease in her father; Stroke in her father.    PHYSICAL EXAM:   VS:  BP 132/80 (BP Location: Left Arm, Patient Position: Sitting, Cuff Size: Large)   Pulse 80   Ht 5\' 3"  (1.6 m)   Wt 214 lb 3.2 oz (97.2 kg)   BMI 37.94 kg/m  General: Alert, oriented x3, no distress, severely obese Head: no evidence of trauma, PERRL, EOMI, no  exophtalmos or lid lag, no myxedema, no xanthelasma; normal ears, nose and oropharynx Neck: normal jugular venous pulsations and no hepatojugular reflux; brisk carotid pulses without delay and no carotid bruits Chest: clear to auscultation, no signs of consolidation by percussion or palpation, normal fremitus, symmetrical and full respiratory excursions Cardiovascular: normal position and quality of the apical impulse, regular rhythm, normal first and second heart sounds, no murmurs, rubs or gallops Abdomen: no tenderness or distention, no masses by palpation, no abnormal pulsatility or arterial bruits, normal bowel sounds, no hepatosplenomegaly Extremities: 2+ bilateral pedal ankle and calf edema, soft pitting. Neurological: grossly nonfocal Psych: Normal mood and affect   Wt Readings from Last 3 Encounters:  04/13/23 214 lb 3.2 oz (97.2 kg)  03/18/23 223 lb (101.2 kg)  12/03/22 222 lb 9.6 oz (101 kg)      Studies/Labs Reviewed:   ECHO 05/20/2020:  1. Left ventricular ejection fraction, by estimation, is 70 to 75%. The left ventricle has hyperdynamic function. The left ventricle has no regional wall motion abnormalities. There is moderate concentric left ventricular hypertrophy. Left ventricular  diastolic parameters are consistent with Grade I diastolic dysfunction (impaired relaxation).   2. Right ventricular systolic function is normal. The right ventricular size is normal. Tricuspid regurgitation signal is inadequate for assessing PA pressure.   3. The mitral valve is degenerative. Trivial mitral valve regurgitation. No evidence of mitral stenosis.   4. The aortic valve is tricuspid. There is mild calcification of the  aortic valve. Aortic valve regurgitation is not visualized. No aortic  stenosis is present.   Comparison(s): No significant change from prior study.   EKG:    EKG Interpretation Date/Time:  Wednesday April 13 2023 12:13:50 EDT Ventricular Rate:  80 PR  Interval:  184 QRS Duration:  88 QT Interval:  368 QTC Calculation: 424 R Axis:   -34  Text Interpretation: Normal sinus rhythm Left axis deviation Left ventricular hypertrophy with repolarization abnormality ( R in aVL , Cornell product ) Inferior infarct (cited on or before 18-Oct-2022) When compared with ECG of 18-Oct-2022 14:05, No significant change was found Confirmed by America Sandall ([id number]) on 04/13/2023 12:35:05 PM        Recent Labs: 09/21/2022: Magnesium 2.2 11/11/2022: Platelets 345 12/25/2022: TSH 1.10 03/15/2023: BUN 16; Creat 1.06; Hemoglobin 13.7; Potassium 4.2; Sodium 133   Lipid Panel    Component Value Date/Time   CHOL 149 03/26/2017 1710   TRIG 130 03/26/2017 1710   HDL 56 12/24/2019 0000   CHOLHDL 3.0 03/26/2017 1710   VLDL 26 03/26/2017 1710   LDLCALC 82 12/24/2019 0000    05/14/2020 Cholesterol 179, HDL 53, LDL 92, triglycerides 198 Hemoglobin A1c 7.5% Creatinine 1.04 Normal liver function tests 12/18/2019 TSH 2.010  02/04/2021 Cholesterol 163, HDL 48, triglycerides 191  12/23/2021 Hemoglobin A1c 7.1%, creatinine 1.06, potassium 4.3  12/24/2022 Cholesterol 142, HDL 56, LDL 58, triglycerides 169  03/18/2023 Hemoglobin A1c 7.1%, hemoglobin 13.7, creatinine 1.06, potassium 4.2  ASSESSMENT:    1. Essential hypertension   2. Atrial flutter, unspecified type (HCC)         PLAN:  In order of problems listed above:  CHF: Predominantly right heart failure, also clearly a component of diastolic left heart failure based on her echo diastolic parameters (e'=4-6, E/e'=10-17).  Hard to know what her dry weight is but she seems to be close to euvolemia, other than persistent leg  edema.  Avoid excessive diuresis due to her history of acute kidney injury.  She denies any shortness of breath.  She will probably continue to have some degree of lower extremity edema even when hypovolemic due to longstanding OSA and obesity, peripheral venous insufficiency  and treatment with diltiazem.  She is on an SGLT2 inhibitor which is also useful for her diabetes control. Possible Atrial flutter: Questionable diagnosis.  ECG from September 2024 actually does not show atrial flutter but baseline motion artifact, probably from her tardive dyskinesia.  No clinically meaningful recurrence.  She has longstanding complaints of brief palpitations.  If she does have atrial arrhythmia, her embolic risk is high due to her numerous risk factors (age, gender, HTN, DM) and she is at increased risk for this arrhythmia due to obstructive sleep apnea and right heart failure. Anticoagulation: Tolerating Eliquis without bleeding problems.  She should definitely not to take both aspirin and Eliquis.  Aspirin was stopped today.  Low threshold to discontinue anticoagulation if she has any bleeding complications. History of acute pericarditis: This occurred in 2024 and resolved with anti-inflammatories. HTN: Consistently well-controlled.  Her diltiazem is useful also for her palpitations, but is contributing to her lower extremity edema. HLP: Although triglycerides are borderline elevated, otherwise she has an excellent lipid profile.  Continue rosuvastatin DM: Good glycemic control.  On both insulin and SGLT2 inhibitor. OSA: Reports 100 send compliance with CPAP.  Denies hypersomnolence.  Followed by Dr. Vassie Loll. Severe obesity: Has not lost additional weight, but is still well below her peak weight after starting [place] and [place].  Medication Adjustments/Labs and Tests Ordered: Current medicines are reviewed at length with the patient today.  Concerns regarding medicines are outlined above.  Medication changes, Labs and Tests ordered today are listed in the Patient Instructions below. Patient Instructions  Medication Instructions:  Stop Aspirin *If you need a refill on your cardiac medications before your next appointment, please call your pharmacy*  Follow-Up: At [facility], you and your health needs are our priority.  As part of our continuing mission to provide you with exceptional heart care, we have created designated Provider Care Teams.  These Care Teams include your primary Cardiologist (physician) and Advanced Practice Providers (APPs -  Physician Assistants and Nurse Practitioners) who all work together to provide you with the care you need, when you need it.  We recommend signing up for the patient portal called "MyChart".  Sign up information is provided on this After Visit Summary.  MyChart is used to connect with patients for Virtual Visits (Telemedicine).  Patients are able to view lab/test results, encounter notes, upcoming appointments, etc.  Non-urgent messages can be sent to your provider as well.   To learn more about what you can do with MyChart, go to [email or web address].    Your next appointment:   1 year(s)  Provider:   Thurmon Fair, MD             Signed, Thurmon Fair, MD  04/20/2023 3:38 PM    [facility] [address] [place], [place], [place]  [address] Phone: [phone]; Fax: [phone]

## 2023-04-20 ENCOUNTER — Encounter: Attending: Endocrinology | Admitting: Nutrition

## 2023-04-20 ENCOUNTER — Telehealth: Payer: Self-pay | Admitting: Nutrition

## 2023-04-20 DIAGNOSIS — Z794 Long term (current) use of insulin: Secondary | ICD-10-CM | POA: Diagnosis not present

## 2023-04-20 DIAGNOSIS — E119 Type 2 diabetes mellitus without complications: Secondary | ICD-10-CM | POA: Diagnosis not present

## 2023-04-20 MED ORDER — LANTUS SOLOSTAR 100 UNIT/ML ~~LOC~~ SOPN: 40.0000 [IU] | PEN_INJECTOR | Freq: Every day | SUBCUTANEOUS | 4 refills | Status: DC

## 2023-04-20 MED ORDER — CEQUR SIMPLICITY 2U DEVI: 1.0000 | 3 refills | Status: DC

## 2023-04-20 MED ORDER — CEQUR SIMPLICITY INSERTER MISC: 0 refills | Status: DC

## 2023-04-20 NOTE — Telephone Encounter (Signed)
 I sent prescription for basal insulin Lantus 40 units daily and CeQur Simplicity.

## 2023-04-20 NOTE — Addendum Note (Signed)
 Addended by: Kirbie Stodghill, [place] on: 04/20/2023 12:47 PM   Modules accepted: Orders

## 2023-04-20 NOTE — Progress Notes (Addendum)
 Patient is no longer taking V-Go.  Finished last one yesterday.  Taking Novolog via pen, but no long acting insulin.   Discussed the Cequr Simplicity and use with a one time/day shot of long acing insulin, or the OmniPod insulin pump.  She is currently wearing the LIbre 3 sensors.  She chose the Simplicity due to it being simpler to use.  Note sent to Dr. Erroll Luna to order the long acting insulin today for her to take tonight, as well as the cequr Simplicity at her new drug store: [facility] on [address].   Patient also complaining of having low blood sugars before lunch at 11AM every day-shakey and sweaty, as well as every day acS.  Says has lows at 11PM 3X/wk.  Last night it was 52mg /dl.  She is currently taking 14u-16u ac meals.  She reports taking Mounjaro and has lost weight, as well as having decreased appetite.  Suggested she reduce that dose to 12-14u.  Written instructions given to her for this.  She was told to call me when she gets her Cecur devices in for training.  She agreed to do this and had no final questions.

## 2023-04-20 NOTE — Patient Instructions (Signed)
 Call when you pick up your Cequr Simplicity devices for training.   Reduce meal time insulin to 12-14u. Ac meals.

## 2023-04-20 NOTE — Telephone Encounter (Signed)
 Patient finished her V-Go 72s yesterday.  She is only taking Novolog 14-16u ac meals now.  We discussed OmniPod pump and Cequr Simplicity.  She would prefer the easier option of a long acing insulin once a day and Cequr's 4 day Novolog filled device.

## 2023-04-21 ENCOUNTER — Telehealth: Payer: Self-pay | Admitting: Dietician

## 2023-04-21 ENCOUNTER — Other Ambulatory Visit: Payer: Self-pay

## 2023-04-21 DIAGNOSIS — E119 Type 2 diabetes mellitus without complications: Secondary | ICD-10-CM

## 2023-04-21 DIAGNOSIS — E1165 Type 2 diabetes mellitus with hyperglycemia: Secondary | ICD-10-CM

## 2023-04-21 MED ORDER — BD PEN NEEDLE NANO U/F 32G X 4 MM MISC: 0 refills | Status: DC

## 2023-04-21 NOTE — Telephone Encounter (Signed)
 Patient called and needs a prescription for the needles for her Lantus pen.  Request sent to RN.  Oran Rein, RD, LDN, CDCES, DipACLM

## 2023-04-26 DIAGNOSIS — N3946 Mixed incontinence: Secondary | ICD-10-CM | POA: Diagnosis not present

## 2023-04-26 DIAGNOSIS — R350 Frequency of micturition: Secondary | ICD-10-CM | POA: Diagnosis not present

## 2023-04-27 ENCOUNTER — Telehealth: Payer: Self-pay | Admitting: Nutrition

## 2023-04-27 ENCOUNTER — Other Ambulatory Visit: Payer: Self-pay

## 2023-04-27 DIAGNOSIS — E1165 Type 2 diabetes mellitus with hyperglycemia: Secondary | ICD-10-CM

## 2023-04-27 MED ORDER — INSULIN ASPART 100 UNIT/ML IJ SOLN: INTRAMUSCULAR | 11 refills | Status: DC

## 2023-04-27 NOTE — Telephone Encounter (Signed)
 Patient called saying that her CeQur Simplicity is in and requests trainging on this.  However, she only has Novolog in a pen.  She will need Novolog in a vial.  Please order this.  Her appointment is Monday morning.  Thank you.

## 2023-04-29 ENCOUNTER — Ambulatory Visit (INDEPENDENT_AMBULATORY_CARE_PROVIDER_SITE_OTHER): Payer: Medicare PPO | Admitting: Endocrinology

## 2023-04-29 ENCOUNTER — Encounter: Payer: Self-pay | Admitting: Endocrinology

## 2023-04-29 DIAGNOSIS — E1165 Type 2 diabetes mellitus with hyperglycemia: Secondary | ICD-10-CM

## 2023-04-29 DIAGNOSIS — Z794 Long term (current) use of insulin: Secondary | ICD-10-CM | POA: Diagnosis not present

## 2023-04-29 MED ORDER — LANTUS SOLOSTAR 100 UNIT/ML ~~LOC~~ SOPN: 35.0000 [IU] | PEN_INJECTOR | Freq: Every day | SUBCUTANEOUS | 4 refills | Status: DC

## 2023-04-29 NOTE — Progress Notes (Signed)
 Outpatient Endocrinology Note Mary Shakerra Red, MD  04/29/23  Patient's Name: [name]    DOB: 01-04-1941    MRN: [id number]                                                    REASON OF VISIT: Follow up for type 2 diabetes mellitus  PCP: Mary Fick, MD  HISTORY OF PRESENT ILLNESS:   [name] is a 83 y.o. old female with past medical history listed below, is here for follow up of type 2 diabetes mellitus.   Pertinent Diabetes History: Patient was diagnosed with type 2 diabetes mellitus in 2011.  She was apparently intolerant to metformin and was treated with Onglyza until about 2014.  Insulin therapy was started around 2014 initially on basal insulin and later mealtime insulin was added.  She has been on V-Go insulin pump since 09/2014.   Chronic Diabetes Complications : Retinopathy: no. Last ophthalmology exam was done on annually, reportedly.  Nephropathy: no, on olmesartan Peripheral neuropathy: yes, painful paresthesia, on gabapentin. Coronary artery disease: no Stroke: yes, TIA  Relevant comorbidities and cardiovascular risk factors: Obesity: yes Body mass index is 39.04 kg/m.  Hypertension: yes Hyperlipidemia. Yes,on statin  Current / Home Diabetic regimen includes: Farxiga 10 mg daily. Mounjaro 10 mg weekly. Lantus 40 units at bedtime.   Prior diabetic medications: Metformin - intolerance.  Onglyza V-Go pump 40 units basal, meal times with 10 - 14 units with breakfast, lunch and dinner.  For  meals breakfast, lunch and supper 5-7 click depending upon meal size. For large meal take 7 click, regular meal take 6 click and smaller meal 5 clicks.  This will be 10 to 14 units with each meals.  Glycemic data:    CONTINUOUS GLUCOSE MONITORING SYSTEM (CGMS) INTERPRETATION: At today's visit, we reviewed CGM downloads. The full report is scanned in the media. Reviewing the CGM trends, blood glucose are as follows:  FreeStyle Libre 3 CGM-  Sensor  Download (Sensor download was reviewed and summarized below.) Dates: March 29 to April 29, 2023, 14 days Sensor Average: 141 Glucose Management Indicator: 6.7%  % data captured: 97%    Interpretation: Mostly acceptable blood sugar.  Blood sugar overnight and in between the meals are acceptable.  Mild hyperglycemia with blood sugar up to 180 range postprandially.  Occasional lower normal blood sugar in between the meals and overnight.  Rare hypoglycemia with blood sugar in 60s related to mealtime bolus.  No significant hypoglycemia.  Hypoglycemia: Patient has rare significant hypoglycemic episodes. Patient has hypoglycemia awareness.  Factors modifying glucose control: 1.  Diabetic diet assessment: three meals a day.  2.  Staying active or exercising: no formal exercise. Walks indoor.   3.  Medication compliance: compliant all of the time.  # Primary hypothyroidism : for several years, on levothyroxine 75 mcg daily,  managed by PCP.   Interval history  V-Go pump was discontinued and it was not covered by medical insurance.  Patient was on V-Go pump until 1 week ago and then switch to Lantus.  She has been on Lantus for last 1 week.  She is taking Lantus 40 units daily along with [place].  She received supplies of CeQur, plan for training on coming Monday with diabetes educator.  CGM data as reviewed above.  She has  been feeling comfortable with basal insulin injection.  Patient was given the option with OmniPod 5 versus CeQur simplicity.  With the complexity of managing closed-loop pump system she preferred to stay on basal and CeQur simplicity system.  Patient reports she occasionally gets blood sugar up to 55 or in 60s overnight not seen on the CGM download.  REVIEW OF SYSTEMS As per history of present illness.   PAST MEDICAL HISTORY: Past Medical History:  Diagnosis Date   Bipolar disorder (HCC)    CHF (congestive heart failure) (HCC)    Depression    Bipolar    Dysrhythmia    GERD (gastroesophageal reflux disease)    History of hiatal hernia    History of kidney stones    History of stomach ulcers 1970s   "bleeding"   Hyperlipidemia    Hypertension    Hypothyroidism    Ischemic colitis, enteritis, or enterocolitis (HCC)    OSA on CPAP    Pericarditis 05/06/2017   Pneumonia    "several times" (06/23/2017)   Thyroid disease    TIA (transient ischemic attack) 2011 X 2   Type II diabetes mellitus (HCC)    Urinary bladder incontinence    Vertigo     PAST SURGICAL HISTORY: Past Surgical History:  Procedure Laterality Date   APPENDECTOMY     BOTOX INJECTION N/A 11/23/2022   Procedure: CYSTOBOTOX INJECTION;  Surgeon: Alfredo Martinez, MD;  Location: [facility];  Service: Urology;  Laterality: N/A;   BREAST CYST ASPIRATION Bilateral    CARDIAC CATHETERIZATION N/A 12/24/2014   Procedure: Right/Left Heart Cath and Coronary Angiography;  Surgeon: Thurmon Fair, MD;  Location: [facility];  Service: Cardiovascular;  Laterality: N/A;   CATARACT EXTRACTION W/ INTRAOCULAR LENS  IMPLANT, BILATERAL Bilateral    EXCISIONAL HEMORRHOIDECTOMY     EYE SURGERY     FRACTURE SURGERY     IR THORACENTESIS ASP PLEURAL SPACE W/IMG GUIDE  05/13/2017   LAPAROSCOPIC CHOLECYSTECTOMY     LITHOTRIPSY  "several times"   RETINAL DETACHMENT SURGERY     "think it was on the left; not sure" (06/23/2017)   TONSILLECTOMY     VAGINAL HYSTERECTOMY     partial     ALLERGIES: Allergies  Allergen Reactions   Flagyl [Metronidazole Hcl] Itching and Rash   Ciprofloxacin Itching and Rash   Metformin And Related Rash   Methimazole Rash   Milk-Related Compounds Other (See Comments)    Stomach pains   Other Other (See Comments)    [place] nuts cause severe facial redness    FAMILY HISTORY:  Family History  Problem Relation Age of Onset   Heart disease Mother    Stroke Father    Parkinson's disease Father    Cancer Sister    Cancer Brother     SOCIAL  HISTORY: Social History   Socioeconomic History   Marital status: Widowed    Spouse name: Not on file   Number of children: 2   Years of education: 60   Highest education level: Not on file  Occupational History   Occupation: Retired  Tobacco Use   Smoking status: Never   Smokeless tobacco: Never  Vaping Use   Vaping status: Never Used  Substance and Sexual Activity   Alcohol use: Never   Drug use: Never   Sexual activity: Not on file  Other Topics Concern   Not on file  Social History Narrative   Lives at home alone.   Right-handed.   No caffeine  use.   Social Drivers of Corporate investment banker Strain: Not on file  Food Insecurity: Not on file  Transportation Needs: Not on file  Physical Activity: Not on file  Stress: Not on file  Social Connections: Not on file    MEDICATIONS:  Current Outpatient Medications  Medication Sig Dispense Refill   Accu-Chek Softclix Lancets lancets Use 1-4 times daily as needed/instructed  DX E11.9 360 each 3   apixaban (ELIQUIS) 5 MG TABS tablet Take 1 tablet (5 mg total) by mouth 2 (two) times daily. 60 tablet 6   Blood Glucose Monitoring Suppl (ACCU-CHEK GUIDE) w/Device KIT Use 1-4 times daily as needed/directed  DX E11.9 1 kit 1   carbamazepine (CARBATROL) 200 MG 12 hr capsule Take 200 mg by mouth 2 (two) times daily.      Cholecalciferol (QC VITAMIN D3) 50 MCG (2000 UT) TABS Take 2,000 Units by mouth daily.     clobetasol (TEMOVATE) 0.05 % GEL Apply 1 application topically 2 (two) times daily as needed (rash).      Continuous Glucose Receiver (FREESTYLE LIBRE 3 READER) DEVI Use for sensor 1 each 0   Continuous Glucose Sensor (FREESTYLE LIBRE 3 SENSOR) MISC 1 Device by Does not apply route every 14 (fourteen) days. Apply 1 sensor on upper arm every 14 days for continuous glucose monitoring 2 each 2   Continuous Glucose Sensor (FREESTYLE LIBRE 3 SENSOR) MISC 1 Device by Does not apply route every 14 (fourteen) days. Apply 1 sensor  on upper arm every 14 days for continuous glucose monitoring 2 each 2   dapagliflozin propanediol (FARXIGA) 10 MG TABS tablet TAKE 1 TABLET(10 MG) BY MOUTH DAILY 90 tablet 2   diltiazem (CARDIZEM CD) 240 MG 24 hr capsule TAKE 1 CAPSULE(240 MG) BY MOUTH DAILY 90 capsule 1   donepezil (ARICEPT) 10 MG tablet Take 10 mg by mouth at bedtime.     esomeprazole (NEXIUM) 40 MG capsule Take 1 capsule (40 mg total) by mouth daily at 12 noon. 30 capsule 2   famotidine (PEPCID) 20 MG tablet Take 1 tablet (20 mg total) by mouth 2 (two) times daily. 60 tablet 1   folic acid (FOLVITE) 1 MG tablet Take 1 mg by mouth daily.     furosemide (LASIX) 40 MG tablet TAKE 1 TABLET BY MOUTH EVERY DAY 90 tablet 1   gabapentin (NEURONTIN) 300 MG capsule Take 300 mg by mouth 4 (four) times daily. Patient takes 1 capsule in the am, 1 capsule in the afternoon and two capsules in the evening     glucose blood (ACCU-CHEK GUIDE) test strip Use 1-4 times daily as needed/instructed DX E11.9 300 each 12   HYDROcodone-acetaminophen (NORCO/VICODIN) 5-325 MG tablet Take 1 tablet by mouth every 6 (six) hours as needed.     injection device for insulin (CEQUR SIMPLICITY 2U) DEVI 1 each by Other route every 3 (three) days. Apply patch as directed and dose as directed, 5-7 squeezes  with meal 3 times  a day ( 1 squeeze = 2 units). 24 each 3   Injection Device for Insulin (CEQUR SIMPLICITY INSERTER) MISC Use as advised 1 each 0   Insulin Disposable Pump (V-GO 40) 40 UNIT/24HR KIT Apply 1 kit topically daily. 30 kit 5   Insulin Disposable Pump (V-GO 40) KIT USE 1 KIT DAILY AS DIRECTED 1 kit 5   Insulin Pen Needle (BD PEN NEEDLE NANO U/F) 32G X 4 MM MISC USE 8 PEN NEEDLES PER DAY 200 each 0  Insulin Pen Needle (COMFORT EZ PEN NEEDLES) 33G X 5 MM MISC 1 each by Does not apply route daily. Use with Tresiba pen 100 each 0   Insulin Pen Needle 32G X 5 MM MISC 1 each by Does not apply route daily. Use with Tresiba pen 100 each 0   levothyroxine  (SYNTHROID, LEVOTHROID) 75 MCG tablet Take 75 mcg by mouth daily before breakfast.     montelukast (SINGULAIR) 5 MG chewable tablet Chew 5 mg by mouth at bedtime.     OLANZapine (ZYPREXA) 10 MG tablet Take 10 mg by mouth at bedtime.     olmesartan (BENICAR) 20 MG tablet Take 20 mg by mouth daily.     ondansetron (ZOFRAN) 4 MG tablet Take 4 mg by mouth every 8 (eight) hours as needed for nausea or vomiting.     OneTouch Delica Lancets 33G MISC Use as directed 200 each 0   pravastatin (PRAVACHOL) 20 MG tablet Take 20 mg by mouth daily.     QUEtiapine (SEROQUEL) 100 MG tablet Take 100 mg by mouth at bedtime.     rosuvastatin (CRESTOR) 20 MG tablet Take 20 mg by mouth daily.     spironolactone (ALDACTONE) 25 MG tablet TAKE 1 TABLET BY MOUTH EVERY DAY 90 tablet 0   sulfamethoxazole-trimethoprim (BACTRIM DS) 800-160 MG tablet Take 1 tablet by mouth 2 (two) times daily. 6 tablet 0   tirzepatide (MOUNJARO) 10 MG/0.5ML Pen Inject 10 mg into the skin once a week. 6 mL 4   triamcinolone cream (KENALOG) 0.1 % Apply 1 Application topically as needed.     Vibegron 75 MG TABS Take 75 mg by mouth daily in the afternoon.     insulin aspart (NOVOLOG) 100 UNIT/ML injection V-Go pump 40 units basal, meal times with 10 - 14 units with breakfast, lunch and dinner. (Patient not taking: Reported on 04/29/2023) 10 mL 11   insulin glargine (LANTUS SOLOSTAR) 100 UNIT/ML Solostar Pen Inject 35 Units into the skin daily. 30 mL 4   No current facility-administered medications for this visit.    PHYSICAL EXAM: Vitals:   04/29/23 1047  BP: (!) 112/54  Pulse: 76  Resp: 20  SpO2: 96%  Weight: 220 lb 6.4 oz (100 kg)  Height: 5\' 3"  (1.6 m)    Body mass index is 39.04 kg/m.  Wt Readings from Last 3 Encounters:  04/29/23 220 lb 6.4 oz (100 kg)  04/13/23 214 lb 3.2 oz (97.2 kg)  03/18/23 223 lb (101.2 kg)    General: Well developed, well nourished female in no apparent distress.  HEENT: AT/[place], no external lesions.   Eyes: Conjunctiva clear and no icterus. Neck: Neck supple  Lungs: Respirations not labored Neurologic: Alert, oriented, normal speech Extremities / Skin: Dry.  Psychiatric: Does not appear depressed or anxious  Diabetic Foot Exam - Simple   No data filed    LABS Reviewed Lab Results  Component Value Date   HGBA1C 7.1 (A) 03/18/2023   HGBA1C 7.0 (H) 11/25/2022   HGBA1C 6.7 (H) 11/11/2022   Lab Results  Component Value Date   FRUCTOSAMINE 267 05/28/2021   FRUCTOSAMINE 223 09/16/2020   FRUCTOSAMINE 259 10/17/2017   Lab Results  Component Value Date   CHOL 149 03/26/2017   HDL 56 12/24/2019   LDLCALC 82 12/24/2019   TRIG 130 03/26/2017   CHOLHDL 3.0 03/26/2017   Lab Results  Component Value Date   MICRALBCREAT 2.2 08/13/2022   MICRALBCREAT 3.3 09/19/2020   Lab Results  Component  Value Date   CREATININE 1.06 (H) 03/15/2023   Lab Results  Component Value Date   GFR 36.20 (L) 11/25/2022    ASSESSMENT / PLAN  1. Type 2 diabetes mellitus with hyperglycemia, with long-term current use of insulin (HCC)     Diabetes Mellitus type 2, complicated by peripheral neuropathy. - Diabetic status / severity: Fair control.  Lab Results  Component Value Date   HGBA1C 7.1 (A) 03/18/2023    - Hemoglobin A1c goal : <7.5%  Relatively controlled diabetes mellitus.  With the patient reported concern of hypoglycemia will decrease the dose of basal insulin as follows.  - Medications:  Diabetes regimen -Decrease Lantus from 40 to 35 units daily at bedtime.  -Continue Mounjaro 10 mg weekly.  -Continue Farxiga 10 mg daily.  -Start CeQur simplicity for meal boluses 5-7 clicks for meals 3 times a day. For large meal take 7 click, regular meal take 6 click and smaller meal 5 clicks.  She has a training with diabetic educator on coming Monday.  - Home glucose testing: Freestyle libre 3 CGM and check as needed. - Discussed/ Gave Hypoglycemia treatment plan.  # Consult : Sent  referral to diabetes educator.   # Annual urine for microalbuminuria/ creatinine ratio, no microalbuminuria currently, continue ACE/ARB /olmesartan. Last  Lab Results  Component Value Date   MICRALBCREAT 2.2 08/13/2022    # Foot check nightly / neuropathy, continue gabapentin.  # Annual dilated diabetic eye exams.   - Diet: Make healthy diabetic food choices - Life style / activity / exercise: Discussed.  2. Blood pressure  -  BP Readings from Last 1 Encounters:  04/29/23 (!) 112/54    - Control is in target.  - No change in current plans.   3. Lipid status / Hyperlipidemia - Last  Lab Results  Component Value Date   LDLCALC 82 12/24/2019   - Continue pravastatin 20 mg daily.  Managed by primary care provider.  Diagnoses and all orders for this visit:  Type 2 diabetes mellitus with hyperglycemia, with long-term current use of insulin (HCC) -     insulin glargine (LANTUS SOLOSTAR) 100 UNIT/ML Solostar Pen; Inject 35 Units into the skin daily.    DISPOSITION Follow up in clinic in 6 weeks suggested.   All questions answered and patient verbalized understanding of the plan.  Mary Olufemi Mofield, MD [facility] Endocrinology [facility] [address] [place], [address] [place], [place] [address] Phone # [phone]  At least part of this note was generated using voice recognition software. Inadvertent word errors may have occurred, which were not recognized during the proofreading process.

## 2023-04-29 NOTE — Patient Instructions (Addendum)
 Decrease Lantus to 35 units daily at bedtime.  Marcelline Deist and [place] same.  Cequr start after training on Monday.

## 2023-05-02 ENCOUNTER — Encounter: Admitting: Nutrition

## 2023-05-02 DIAGNOSIS — Z794 Long term (current) use of insulin: Secondary | ICD-10-CM | POA: Diagnosis not present

## 2023-05-02 DIAGNOSIS — E119 Type 2 diabetes mellitus without complications: Secondary | ICD-10-CM | POA: Diagnosis not present

## 2023-05-02 NOTE — Patient Instructions (Signed)
 Fill and apply a new Cequr every 4 days.  Count by 2 with each button press to deliver 12u before breakfast and lunch and 14-16u before supper.   Call Cequr help line if questions.  [phone]

## 2023-05-02 NOTE — Progress Notes (Unsigned)
 Patient was trained on the use of the CeQur Simplicity device.  She was shown how to fill, apply and use this device. She is filling the device with 200u.  (She takes 12u acB and AcL, and 14-16u acS, with an occasional 12u at for HS snack 1-3X/wk.   She filled a device with 200u of Novolog and attached this to her right abdomen.  She was encouraged to rotate these sites, using both sides of her abdomen and making sure that that the next device was more than 2 inches from the previous one. She agreed to do this.  We also reviewed how to use this, making sure she knows that each button press delivers 2 units of insulin.  She re verbalized this correctly, and can easily count by 2s when pressing the button and delivering her insulin.   She had no final questions.  I called the company, and they are going to have another educator call her on Friday at 10AM for her next Cequr fill.

## 2023-05-03 ENCOUNTER — Encounter: Payer: Self-pay | Admitting: Endocrinology

## 2023-05-04 ENCOUNTER — Other Ambulatory Visit: Payer: Self-pay

## 2023-05-04 DIAGNOSIS — E1165 Type 2 diabetes mellitus with hyperglycemia: Secondary | ICD-10-CM

## 2023-05-04 NOTE — Telephone Encounter (Signed)
 Rx sent 04/27/23

## 2023-05-09 ENCOUNTER — Telehealth: Payer: Self-pay

## 2023-05-09 NOTE — Telephone Encounter (Signed)
 Patient called stating that in the evenings when she takes her Lantus  she gets a drop in her blood glucose levels a couple hours after and she has trouble keeping it up. Per patient she takes Lantus  7pm nightly and generally between 9-11pm she notices that her glucose levels are ranging in the 50s-60s with lowest being 55 on 05/04/23. Patient did not double check using a meter, RN advised patient that she should always double check, patient did admit to being symptomatic stated she felt "shaky". Patient stated she did not take it over the weekend and had numbers on Saturday and Sunday ranging from 100-110. Patient calling for advice with medication management.

## 2023-05-09 NOTE — Telephone Encounter (Signed)
 Medication changes and advisement given to patient. No further questions at this time.

## 2023-05-09 NOTE — Telephone Encounter (Signed)
 I recommend to decrease Lantus  from 35 units to 25 units daily.  Call our clinic in 1 week to update regarding blood sugar trend.

## 2023-05-20 DIAGNOSIS — I129 Hypertensive chronic kidney disease with stage 1 through stage 4 chronic kidney disease, or unspecified chronic kidney disease: Secondary | ICD-10-CM | POA: Diagnosis not present

## 2023-05-20 DIAGNOSIS — E782 Mixed hyperlipidemia: Secondary | ICD-10-CM | POA: Diagnosis not present

## 2023-05-20 DIAGNOSIS — N1831 Chronic kidney disease, stage 3a: Secondary | ICD-10-CM | POA: Diagnosis not present

## 2023-05-20 DIAGNOSIS — E1165 Type 2 diabetes mellitus with hyperglycemia: Secondary | ICD-10-CM | POA: Diagnosis not present

## 2023-05-20 DIAGNOSIS — Z794 Long term (current) use of insulin: Secondary | ICD-10-CM | POA: Diagnosis not present

## 2023-05-20 DIAGNOSIS — I5032 Chronic diastolic (congestive) heart failure: Secondary | ICD-10-CM | POA: Diagnosis not present

## 2023-05-20 DIAGNOSIS — I130 Hypertensive heart and chronic kidney disease with heart failure and stage 1 through stage 4 chronic kidney disease, or unspecified chronic kidney disease: Secondary | ICD-10-CM | POA: Diagnosis not present

## 2023-05-20 DIAGNOSIS — E039 Hypothyroidism, unspecified: Secondary | ICD-10-CM | POA: Diagnosis not present

## 2023-05-27 DIAGNOSIS — Z23 Encounter for immunization: Secondary | ICD-10-CM | POA: Diagnosis not present

## 2023-05-27 DIAGNOSIS — E1165 Type 2 diabetes mellitus with hyperglycemia: Secondary | ICD-10-CM | POA: Diagnosis not present

## 2023-05-27 DIAGNOSIS — N1831 Chronic kidney disease, stage 3a: Secondary | ICD-10-CM | POA: Diagnosis not present

## 2023-05-27 DIAGNOSIS — Z794 Long term (current) use of insulin: Secondary | ICD-10-CM | POA: Diagnosis not present

## 2023-05-27 DIAGNOSIS — Z0000 Encounter for general adult medical examination without abnormal findings: Secondary | ICD-10-CM | POA: Diagnosis not present

## 2023-05-27 DIAGNOSIS — I5032 Chronic diastolic (congestive) heart failure: Secondary | ICD-10-CM | POA: Diagnosis not present

## 2023-05-27 DIAGNOSIS — I130 Hypertensive heart and chronic kidney disease with heart failure and stage 1 through stage 4 chronic kidney disease, or unspecified chronic kidney disease: Secondary | ICD-10-CM | POA: Diagnosis not present

## 2023-05-27 DIAGNOSIS — E782 Mixed hyperlipidemia: Secondary | ICD-10-CM | POA: Diagnosis not present

## 2023-06-10 ENCOUNTER — Other Ambulatory Visit: Payer: Self-pay | Admitting: Cardiovascular Disease

## 2023-06-10 ENCOUNTER — Other Ambulatory Visit: Payer: Self-pay | Admitting: Endocrinology

## 2023-06-10 DIAGNOSIS — E1165 Type 2 diabetes mellitus with hyperglycemia: Secondary | ICD-10-CM

## 2023-06-10 NOTE — Telephone Encounter (Signed)
Medication request complete

## 2023-06-14 ENCOUNTER — Other Ambulatory Visit: Payer: Self-pay

## 2023-06-14 DIAGNOSIS — E1165 Type 2 diabetes mellitus with hyperglycemia: Secondary | ICD-10-CM

## 2023-06-14 MED ORDER — INSULIN ASPART 100 UNIT/ML IJ SOLN: INTRAMUSCULAR | 11 refills | Status: DC

## 2023-06-23 ENCOUNTER — Ambulatory Visit: Payer: Self-pay | Admitting: Endocrinology

## 2023-06-23 ENCOUNTER — Encounter: Payer: Self-pay | Admitting: Endocrinology

## 2023-06-23 ENCOUNTER — Ambulatory Visit (INDEPENDENT_AMBULATORY_CARE_PROVIDER_SITE_OTHER): Admitting: Endocrinology

## 2023-06-23 VITALS — BP 108/50 | HR 75 | Resp 20 | Ht 63.0 in | Wt 222.6 lb

## 2023-06-23 DIAGNOSIS — E119 Type 2 diabetes mellitus without complications: Secondary | ICD-10-CM

## 2023-06-23 DIAGNOSIS — E1165 Type 2 diabetes mellitus with hyperglycemia: Secondary | ICD-10-CM

## 2023-06-23 DIAGNOSIS — Z794 Long term (current) use of insulin: Secondary | ICD-10-CM | POA: Diagnosis not present

## 2023-06-23 LAB — POCT GLYCOSYLATED HEMOGLOBIN (HGB A1C): Hemoglobin A1C: 7.6 % — AB (ref 4.0–5.6)

## 2023-06-23 MED ORDER — TIRZEPATIDE 12.5 MG/0.5ML ~~LOC~~ SOAJ: 12.5000 mg | SUBCUTANEOUS | 4 refills | Status: AC

## 2023-06-23 MED ORDER — CEQUR SIMPLICITY 2U DEVI: 1.0000 | 3 refills | Status: DC

## 2023-06-23 MED ORDER — LANTUS SOLOSTAR 100 UNIT/ML ~~LOC~~ SOPN: 25.0000 [IU] | PEN_INJECTOR | Freq: Every day | SUBCUTANEOUS | 4 refills | Status: DC

## 2023-06-23 MED ORDER — INSULIN ASPART 100 UNIT/ML IJ SOLN: INTRAMUSCULAR | 11 refills | Status: DC

## 2023-06-23 NOTE — Progress Notes (Signed)
 Outpatient Endocrinology Note [place] Atiyah Bauer, MD  06/23/23  Patient's Name: [name]    DOB: 12/15/1940    MRN: [id number]                                                    REASON OF VISIT: Follow up for type 2 diabetes mellitus  PCP: Virgle Grime, MD  HISTORY OF PRESENT ILLNESS:   [name] is a 83 y.o. old female with past medical history listed below, is here for follow up of type 2 diabetes mellitus.   Pertinent Diabetes History: Patient was diagnosed with type 2 diabetes mellitus in 2011.  She was apparently intolerant to metformin  and was treated with Onglyza until about 2014.  Insulin  therapy was started around 2014 initially on basal insulin  and later mealtime insulin  was added.  She has been on V-Go insulin  pump since 09/2014.   Chronic Diabetes Complications : Retinopathy: no. Last ophthalmology exam was done on annually, reportedly.  Nephropathy: no, on olmesartan  Peripheral neuropathy: yes, painful paresthesia, on gabapentin . Coronary artery disease: no Stroke: yes, TIA  Relevant comorbidities and cardiovascular risk factors: Obesity: yes Body mass index is 39.43 kg/m.  Hypertension: yes Hyperlipidemia. Yes,on statin  Current / Home Diabetic regimen includes: Farxiga  10 mg daily. Mounjaro  10 mg weekly. Lantus  25 units at bedtime.  CeQur simplicity  14 units ( 7 squeezes)  with meals three times a day.  Taking after meal.  Lantus  was gradually decreased from 40 to 35 to 25 units in between visits.  Prior diabetic medications: Metformin  - intolerance.  Onglyza V-Go pump 40 units basal, meal times with 10 - 14 units with breakfast, lunch and dinner.  For  meals breakfast, lunch and supper 5-7 click depending upon meal size. For large meal take 7 click, regular meal take 6 click and smaller meal 5 clicks.  This will be 10 to 14 units with each meals.  Glycemic data:    CONTINUOUS GLUCOSE MONITORING SYSTEM (CGMS) INTERPRETATION: At  today's visit, we reviewed CGM downloads. The full report is scanned in the media. Reviewing the CGM trends, blood glucose are as follows:  FreeStyle Libre 3 CGM-  Sensor Download (Sensor download was reviewed and summarized below.) Dates: May 23 to June 5 , 2025, 14 days Sensor Average: 169 Glucose Management Indicator: 7.4%  % data captured: 98%    Interpretation: Overall mostly acceptable blood sugar with random hyperglycemia postprandially on some of the days especially with breakfast, sometimes with lunch and supper.  Some of the days acceptable blood sugar with meals and in between the meals and overnight.  No concerning hypoglycemia.  Hypoglycemia: Patient has no significant hypoglycemic episodes. Patient has hypoglycemia awareness.  Factors modifying glucose control: 1.  Diabetic diet assessment: three meals a day.  2.  Staying active or exercising: no formal exercise. Walks indoor.   3.  Medication compliance: compliant all of the time.  # Primary hypothyroidism : for several years, on levothyroxine  75 mcg daily,  managed by PCP.   Interval history  Diabetes regimen as reviewed above.  Patient is now on basal insulin  with Cenqur, as reviewed and noted above.  CGM data as reviewed above.  No stomach issues on Mounjaro .  No hypo and hyperthyroid symptoms.  Denies numbness tingling of the feet.  No other complaints today.  She has complaints of lightheadedness in the beginning of the visit however at the end of visit in the clinic today she denies lightheadedness or dizziness.  She has mildly low/low normal blood pressure in the clinic today.  She feels comfortable going home from the clinic today.   REVIEW OF SYSTEMS As per history of present illness.   PAST MEDICAL HISTORY: Past Medical History:  Diagnosis Date   Bipolar disorder (HCC)    CHF (congestive heart failure) (HCC)    Depression    Bipolar   Dysrhythmia    GERD (gastroesophageal reflux disease)     History of hiatal hernia    History of kidney stones    History of stomach ulcers 1970s   "bleeding"   Hyperlipidemia    Hypertension    Hypothyroidism    Ischemic colitis, enteritis, or enterocolitis (HCC)    OSA on CPAP    Pericarditis 05/06/2017   Pneumonia    "several times" (06/23/2017)   Thyroid  disease    TIA (transient ischemic attack) 2011 X 2   Type II diabetes mellitus (HCC)    Urinary bladder incontinence    Vertigo     PAST SURGICAL HISTORY: Past Surgical History:  Procedure Laterality Date   APPENDECTOMY     BOTOX  INJECTION N/A 11/23/2022   Procedure: CYSTOBOTOX INJECTION;  Surgeon: Erman Hayward, MD;  Location: [facility];  Service: Urology;  Laterality: N/A;   BREAST CYST ASPIRATION Bilateral    CARDIAC CATHETERIZATION N/A 12/24/2014   Procedure: Right/Left Heart Cath and Coronary Angiography;  Surgeon: Luana Rumple, MD;  Location: [facility];  Service: Cardiovascular;  Laterality: N/A;   CATARACT EXTRACTION W/ INTRAOCULAR LENS  IMPLANT, BILATERAL Bilateral    EXCISIONAL HEMORRHOIDECTOMY     EYE SURGERY     FRACTURE SURGERY     IR THORACENTESIS ASP PLEURAL SPACE W/IMG GUIDE  05/13/2017   LAPAROSCOPIC CHOLECYSTECTOMY     LITHOTRIPSY  "several times"   RETINAL DETACHMENT SURGERY     "think it was on the left; not sure" (06/23/2017)   TONSILLECTOMY     VAGINAL HYSTERECTOMY     partial     ALLERGIES: Allergies  Allergen Reactions   Flagyl [Metronidazole Hcl] Itching and Rash   Ciprofloxacin Itching and Rash   Metformin  And Related Rash   Methimazole Rash   Milk-Related Compounds Other (See Comments)    Stomach pains   Other Other (See Comments)    [place] nuts cause severe facial redness    FAMILY HISTORY:  Family History  Problem Relation Age of Onset   Heart disease Mother    Stroke Father    Parkinson's disease Father    Cancer Sister    Cancer Brother     SOCIAL HISTORY: Social History   Socioeconomic History   Marital status:  Widowed    Spouse name: Not on file   Number of children: 2   Years of education: 85   Highest education level: Not on file  Occupational History   Occupation: Retired  Tobacco Use   Smoking status: Never   Smokeless tobacco: Never  Vaping Use   Vaping status: Never Used  Substance and Sexual Activity   Alcohol use: Never   Drug use: Never   Sexual activity: Not on file  Other Topics Concern   Not on file  Social History Narrative   Lives at home alone.   Right-handed.   No caffeine use.   Social Drivers of Dispensing optician  Resource Strain: Not on file  Food Insecurity: Not on file  Transportation Needs: Not on file  Physical Activity: Not on file  Stress: Not on file  Social Connections: Not on file    MEDICATIONS:  Current Outpatient Medications  Medication Sig Dispense Refill   Accu-Chek Softclix Lancets lancets Use 1-4 times daily as needed/instructed  DX E11.9 360 each 3   apixaban  (ELIQUIS ) 5 MG TABS tablet Take 1 tablet (5 mg total) by mouth 2 (two) times daily. 60 tablet 6   Blood Glucose Monitoring Suppl (ACCU-CHEK GUIDE) w/Device KIT Use 1-4 times daily as needed/directed  DX E11.9 1 kit 1   carbamazepine  (CARBATROL ) 200 MG 12 hr capsule Take 200 mg by mouth 2 (two) times daily.      Cholecalciferol  (QC VITAMIN D3) 50 MCG (2000 UT) TABS Take 2,000 Units by mouth daily.     clobetasol (TEMOVATE) 0.05 % GEL Apply 1 application topically 2 (two) times daily as needed (rash).      Continuous Glucose Receiver (FREESTYLE LIBRE 3 READER) DEVI Use for sensor 1 each 0   Continuous Glucose Sensor (FREESTYLE LIBRE 3 SENSOR) MISC 1 Device by Does not apply route every 14 (fourteen) days. Apply 1 sensor on upper arm every 14 days for continuous glucose monitoring 2 each 2   Continuous Glucose Sensor (FREESTYLE LIBRE 3 SENSOR) MISC 1 Device by Does not apply route every 14 (fourteen) days. Apply 1 sensor on upper arm every 14 days for continuous glucose monitoring 2 each 2    diltiazem  (CARDIZEM  CD) 240 MG 24 hr capsule TAKE 1 CAPSULE BY MOUTH DAILY 90 capsule 3   donepezil  (ARICEPT ) 10 MG tablet Take 10 mg by mouth at bedtime.     esomeprazole  (NEXIUM ) 40 MG capsule Take 1 capsule (40 mg total) by mouth daily at 12 noon. 30 capsule 2   famotidine  (PEPCID ) 20 MG tablet Take 1 tablet (20 mg total) by mouth 2 (two) times daily. 60 tablet 1   FARXIGA  10 MG TABS tablet TAKE 1 TABLET BY MOUTH DAILY 90 tablet 2   folic acid  (FOLVITE ) 1 MG tablet Take 1 mg by mouth daily.     furosemide  (LASIX ) 40 MG tablet TAKE 1 TABLET BY MOUTH EVERY DAY 90 tablet 1   gabapentin  (NEURONTIN ) 300 MG capsule Take 300 mg by mouth 4 (four) times daily. Patient takes 1 capsule in the am, 1 capsule in the afternoon and two capsules in the evening     glucose blood (ACCU-CHEK GUIDE) test strip Use 1-4 times daily as needed/instructed DX E11.9 300 each 12   HYDROcodone -acetaminophen  (NORCO/VICODIN) 5-325 MG tablet Take 1 tablet by mouth every 6 (six) hours as needed.     Injection Device for Insulin  (CEQUR SIMPLICITY INSERTER) MISC USE AS ADVISED 1 each 0   Insulin  Pen Needle (BD PEN NEEDLE NANO U/F) 32G X 4 MM MISC USE 8 PEN NEEDLES PER DAY 200 each 0   Insulin  Pen Needle (COMFORT EZ PEN NEEDLES) 33G X 5 MM MISC 1 each by Does not apply route daily. Use with Tresiba  pen 100 each 0   Insulin  Pen Needle 32G X 5 MM MISC 1 each by Does not apply route daily. Use with Tresiba  pen 100 each 0   levothyroxine  (SYNTHROID , LEVOTHROID) 75 MCG tablet Take 75 mcg by mouth daily before breakfast.     montelukast  (SINGULAIR ) 5 MG chewable tablet Chew 5 mg by mouth at bedtime.     OLANZapine  (ZYPREXA ) 10 MG  tablet Take 20 mg by mouth at bedtime.     olmesartan  (BENICAR ) 20 MG tablet Take 20 mg by mouth daily.     ondansetron  (ZOFRAN ) 4 MG tablet Take 4 mg by mouth every 8 (eight) hours as needed for nausea or vomiting.     OneTouch Delica Lancets 33G MISC Use as directed 200 each 0   pravastatin  (PRAVACHOL ) 20  MG tablet Take 20 mg by mouth daily.     QUEtiapine  (SEROQUEL ) 100 MG tablet Take 100 mg by mouth at bedtime.     rosuvastatin (CRESTOR) 20 MG tablet Take 20 mg by mouth daily.     spironolactone  (ALDACTONE ) 25 MG tablet TAKE 1 TABLET BY MOUTH EVERY DAY 90 tablet 0   sulfamethoxazole -trimethoprim  (BACTRIM  DS) 800-160 MG tablet Take 1 tablet by mouth 2 (two) times daily. 6 tablet 0   tirzepatide  (MOUNJARO ) 12.5 MG/0.5ML Pen Inject 12.5 mg into the skin once a week. 6 mL 4   triamcinolone cream (KENALOG) 0.1 % Apply 1 Application topically as needed.     Vibegron 75 MG TABS Take 75 mg by mouth daily in the afternoon.     injection device for insulin  (CEQUR SIMPLICITY 2U) DEVI 1 each by Other route every 3 (three) days. Apply patch as directed and dose as directed, 5 squeezes  with meal 3 times  a day ( 1 squeeze = 2 units). 24 each 3   insulin  aspart (NOVOLOG ) 100 UNIT/ML injection V-Go pump 40 units basal, meal times with 10 - 14 units with breakfast, lunch and dinner. 10 mL 11   insulin  glargine (LANTUS  SOLOSTAR) 100 UNIT/ML Solostar Pen Inject 25 Units into the skin daily. 30 mL 4   No current facility-administered medications for this visit.    PHYSICAL EXAM: Vitals:   06/23/23 1437  BP: (!) 108/50  Pulse: 75  Resp: 20  SpO2: 96%  Weight: 222 lb 9.6 oz (101 kg)  Height: 5\' 3"  (1.6 m)     Body mass index is 39.43 kg/m.  Wt Readings from Last 3 Encounters:  06/23/23 222 lb 9.6 oz (101 kg)  04/29/23 220 lb 6.4 oz (100 kg)  04/13/23 214 lb 3.2 oz (97.2 kg)    General: Well developed, well nourished female in no apparent distress.  HEENT: AT/[place], no external lesions.  Eyes: Conjunctiva clear and no icterus. Neck: Neck supple  Lungs: Respirations not labored Neurologic: Alert, oriented, normal speech Extremities / Skin: Dry. B/l pedal edema.  Psychiatric: Does not appear depressed or anxious  Diabetic Foot Exam - Simple   Simple Foot Form Diabetic Foot exam was performed with  the following findings: Yes 06/23/2023  2:53 PM  Visual Inspection No deformities, no ulcerations, no other skin breakdown bilaterally: Yes Sensation Testing Intact to touch and monofilament testing bilaterally: Yes Pulse Check Posterior Tibialis and Dorsalis pulse intact bilaterally: Yes Comments    LABS Reviewed Lab Results  Component Value Date   HGBA1C 7.6 (A) 06/23/2023   HGBA1C 7.1 (A) 03/18/2023   HGBA1C 7.0 (H) 11/25/2022   Lab Results  Component Value Date   FRUCTOSAMINE 267 05/28/2021   FRUCTOSAMINE 223 09/16/2020   FRUCTOSAMINE 259 10/17/2017   Lab Results  Component Value Date   CHOL 149 03/26/2017   HDL 56 12/24/2019   LDLCALC 82 12/24/2019   TRIG 130 03/26/2017   CHOLHDL 3.0 03/26/2017   Lab Results  Component Value Date   MICRALBCREAT 2.2 08/13/2022   MICRALBCREAT 3.3 09/19/2020   Lab Results  Component Value Date   CREATININE 1.06 (H) 03/15/2023   Lab Results  Component Value Date   GFR 36.20 (L) 11/25/2022    ASSESSMENT / PLAN  1. Type 2 diabetes mellitus with hyperglycemia, with long-term current use of insulin  (HCC)   2. Uncontrolled type 2 diabetes mellitus with hyperglycemia, with long-term current use of insulin  (HCC)   3. Type 2 diabetes mellitus without complication, unspecified whether long term insulin  use (HCC)    Diabetes Mellitus type 2, complicated by peripheral neuropathy. - Diabetic status / severity: Fair control.  Lab Results  Component Value Date   HGBA1C 7.6 (A) 06/23/2023    - Hemoglobin A1c goal : <7.5%  Overall acceptable control of diabetes.  She has postprandial hyperglycemia likely related to taking CeQur insulin  after eating.  - Medications:  Diabetes regimen - Continue Lantus  25 units daily.  - Increase Mounjaro  from 10 to 12.5 mg weekly.  -Continue Farxiga  10 mg daily.  -Decrease CeQur simplicity 7 to 5 squeezes / 14 to 10 units with meals 3 times a day, advised to take before eating.  - Home glucose  testing: Freestyle libre 3 CGM and check as needed. - Discussed/ Gave Hypoglycemia treatment plan.  # Consult : Sent referral to diabetes educator.   # Annual urine for microalbuminuria/ creatinine ratio, no microalbuminuria currently, continue ACE/ARB /olmesartan . Last  Lab Results  Component Value Date   MICRALBCREAT 2.2 08/13/2022    # Foot check nightly / neuropathy, continue gabapentin .  # Annual dilated diabetic eye exams.   - Diet: Make healthy diabetic food choices - Life style / activity / exercise: Discussed.  2. Blood pressure  -  BP Readings from Last 1 Encounters:  06/23/23 (!) 108/50    - Control is in target.  - No change in current plans.   Patient initially had lightheadedness however resolved by the end of clinic visit today.  She feels comfortable going home.  Patient is asked to discuss with primary care provider and cardiology to adjust her blood pressure medication.  Patient reports she daily measures blood pressure at home and has been normal in the range of 130/80.  3. Lipid status / Hyperlipidemia - Last  Lab Results  Component Value Date   LDLCALC 82 12/24/2019   - Continue pravastatin  20 mg daily.  Managed by primary care provider.  Diagnoses and all orders for this visit:  Type 2 diabetes mellitus with hyperglycemia, with long-term current use of insulin  (HCC) -     POCT glycosylated hemoglobin (Hb A1C) -     tirzepatide  (MOUNJARO ) 12.5 MG/0.5ML Pen; Inject 12.5 mg into the skin once a week. -     Microalbumin / creatinine urine ratio -     insulin  glargine (LANTUS  SOLOSTAR) 100 UNIT/ML Solostar Pen; Inject 25 Units into the skin daily.  Uncontrolled type 2 diabetes mellitus with hyperglycemia, with long-term current use of insulin  (HCC) -     insulin  aspart (NOVOLOG ) 100 UNIT/ML injection; V-Go pump 40 units basal, meal times with 10 - 14 units with breakfast, lunch and dinner.  Type 2 diabetes mellitus without complication, unspecified  whether long term insulin  use (HCC) -     injection device for insulin  (CEQUR SIMPLICITY 2U) DEVI; 1 each by Other route every 3 (three) days. Apply patch as directed and dose as directed, 5 squeezes  with meal 3 times  a day ( 1 squeeze = 2 units).    DISPOSITION Follow up in clinic in 3 months  suggested.   All questions answered and patient verbalized understanding of the plan.  [place] Rashawd Laskaris, MD [facility] Endocrinology [facility] [address] [place], [address] [place], [place] [address] Phone # [phone]  At least part of this note was generated using voice recognition software. Inadvertent word errors may have occurred, which were not recognized during the proofreading process.

## 2023-06-23 NOTE — Patient Instructions (Addendum)
 Lantus  25 units daily at bedtime.  Farxiga  10 mg daily.  Increase mounjaro  to 12.5 mg weekly.  Cequr 5 clicks/ 10 units with 3 times a day with meals before eating.

## 2023-06-23 NOTE — Progress Notes (Signed)
 Patient's BP 108/50, patient complains of lightheadedness today. Stated she has not been lightheaded before today. MD made aware.

## 2023-06-27 ENCOUNTER — Other Ambulatory Visit: Payer: Self-pay

## 2023-06-27 DIAGNOSIS — E1165 Type 2 diabetes mellitus with hyperglycemia: Secondary | ICD-10-CM

## 2023-06-27 MED ORDER — INSULIN ASPART 100 UNIT/ML IJ SOLN: INTRAMUSCULAR | 11 refills | Status: DC

## 2023-06-30 ENCOUNTER — Other Ambulatory Visit: Payer: Self-pay | Admitting: Endocrinology

## 2023-06-30 DIAGNOSIS — E1165 Type 2 diabetes mellitus with hyperglycemia: Secondary | ICD-10-CM

## 2023-06-30 MED ORDER — INSULIN ASPART 100 UNIT/ML IJ SOLN: INTRAMUSCULAR | 11 refills | Status: DC

## 2023-07-04 DIAGNOSIS — E1165 Type 2 diabetes mellitus with hyperglycemia: Secondary | ICD-10-CM | POA: Insufficient documentation

## 2023-07-04 DIAGNOSIS — E039 Hypothyroidism, unspecified: Secondary | ICD-10-CM | POA: Insufficient documentation

## 2023-07-04 DIAGNOSIS — E559 Vitamin D deficiency, unspecified: Secondary | ICD-10-CM | POA: Insufficient documentation

## 2023-07-04 DIAGNOSIS — N1831 Chronic kidney disease, stage 3a: Secondary | ICD-10-CM | POA: Insufficient documentation

## 2023-07-04 DIAGNOSIS — Z7983 Long term (current) use of bisphosphonates: Secondary | ICD-10-CM | POA: Insufficient documentation

## 2023-07-04 DIAGNOSIS — R2681 Unsteadiness on feet: Secondary | ICD-10-CM | POA: Insufficient documentation

## 2023-07-04 DIAGNOSIS — K2100 Gastro-esophageal reflux disease with esophagitis, without bleeding: Secondary | ICD-10-CM | POA: Insufficient documentation

## 2023-07-04 DIAGNOSIS — F320 Major depressive disorder, single episode, mild: Secondary | ICD-10-CM | POA: Insufficient documentation

## 2023-07-04 DIAGNOSIS — N3946 Mixed incontinence: Secondary | ICD-10-CM | POA: Insufficient documentation

## 2023-07-04 DIAGNOSIS — Z0000 Encounter for general adult medical examination without abnormal findings: Secondary | ICD-10-CM | POA: Insufficient documentation

## 2023-07-04 DIAGNOSIS — I130 Hypertensive heart and chronic kidney disease with heart failure and stage 1 through stage 4 chronic kidney disease, or unspecified chronic kidney disease: Secondary | ICD-10-CM | POA: Insufficient documentation

## 2023-07-04 DIAGNOSIS — F319 Bipolar disorder, unspecified: Secondary | ICD-10-CM | POA: Insufficient documentation

## 2023-07-04 DIAGNOSIS — R269 Unspecified abnormalities of gait and mobility: Secondary | ICD-10-CM | POA: Insufficient documentation

## 2023-07-04 DIAGNOSIS — I5032 Chronic diastolic (congestive) heart failure: Secondary | ICD-10-CM | POA: Insufficient documentation

## 2023-07-05 ENCOUNTER — Ambulatory Visit (INDEPENDENT_AMBULATORY_CARE_PROVIDER_SITE_OTHER): Admitting: Neurology

## 2023-07-05 ENCOUNTER — Encounter: Payer: Self-pay | Admitting: Neurology

## 2023-07-05 VITALS — BP 91/53 | HR 103

## 2023-07-05 DIAGNOSIS — R251 Tremor, unspecified: Secondary | ICD-10-CM

## 2023-07-05 DIAGNOSIS — R413 Other amnesia: Secondary | ICD-10-CM | POA: Diagnosis not present

## 2023-07-05 DIAGNOSIS — R269 Unspecified abnormalities of gait and mobility: Secondary | ICD-10-CM

## 2023-07-05 NOTE — Progress Notes (Signed)
 Chief Complaint  Patient presents with   New Patient (Initial Visit)    Rm14, alone, referral for Lightheaded: ongoing 1 yr, Tremor-possible autonomic insuffiency/Dr. [facility] [phone]: orthostatic bp complete, whole body tremors ongoing a yr.       ASSESSMENT AND PLAN  [name] is a 83 y.o. female   Tremor  Left more than right, with mild bradykinesia on the left, suggesting mild parkinsonian features, she has apparent action component, mild to moderate difficulty drawing spiral circle  Long history of antipsychotic medications due to bipolar disorder, including Zyprexa , which has lower, but still potential for extrapyramidal symptoms,  DatScan to rule out dopamine deficiency Memory loss  MoCA examination 23/30  MRI of brain  Laboratory evaluation to rule out treatable etiology, she also design Alzheimer's specific profile  Return to clinic in 6 months   DIAGNOSTIC DATA (LABS, IMAGING, TESTING) - I reviewed patient records, labs, notes, testing and imaging myself where available.   MEDICAL HISTORY:  [name] is a 83 year old left handed female, seen in request by her primary care from [facility] Dr. Gloria Lares, Doyle Generous for evaluation of tremor, memory loss, initial evaluation July 05, 2023  History is obtained from the patient and review of electronic medical records. I personally reviewed pertinent available imaging films in PACS.   PMHx of  DM-insulin  dependent DM peripheral neuropathy HLD HTN Hypothyrodism Afib, CHF Bipolar disorder, polypharmacy, zyprexa  20mg  at bedtime, seroquel  100mg  at bedtime, Carbatrol  200mg  bid Stroke History of kidney stone  Patient is a retired high school Retail buyer, lives alone, drove to clinic today,  Since 2024, she began to develop this tremor, all over her body, gradually getting worse, almost constant, bothersome, but no limitation in her daily activity, I know I  got it, I should not have it  She is sedentary, walk half mile few times each week, denies significant gait change, she woke up frequently during her sleep using bathroom, has a long history of urinary urgency,  She had long history of bipolar disorder, on polypharmacy, including Zyprexa , Seroquel , Carbatrol , because of her tremor, there was a trial with her psychiatrist of lower dose of Zyprexa  without any improvement  She has decreased smell for many years, denied REM sleep disorder, or excessive movement during sleep,  Her mother suffered tremor in her old age, similar to hers, but 5 other sisters do not have similar issues  Labs in June 2025, A1C 7.6, Na 133, creat 1.06, LDL 58,  PHYSICAL EXAM:   Vitals:   07/05/23 1055 07/05/23 1056 07/05/23 1059  BP: (!) 99/55 (!) 89/56 (!) 91/53  Pulse: (!) 103 97 (!) 103  SpO2: 92% 95% 94%     PHYSICAL EXAMNIATION:  Gen: NAD, conversant, well nourised, well groomed                     Cardiovascular: Regular rate rhythm, no peripheral edema, warm, nontender. Eyes: Conjunctivae clear without exudates or hemorrhage Neck: Supple, no carotid bruits. Pulmonary: Clear to auscultation bilaterally   NEUROLOGICAL EXAM:  MENTAL STATUS: Speech/cognition: Awake, alert, oriented to history taking and casual conversation    07/05/2023   11:00 AM  Montreal Cognitive Assessment   Visuospatial/ Executive (0/5) 5  Naming (0/3) 3  Attention: Read list of digits (0/2) 2  Attention: Read list of letters (0/1) 1  Attention: Serial 7 subtraction starting at 100 (0/3) 1  Language: Repeat phrase (0/2) 2  Language : Fluency (0/1) 0  Abstraction (0/2) 2  Delayed Recall (0/5) 2  Orientation (0/6) 5  Total 23    CRANIAL NERVES: CN II: Visual fields are full to confrontation. Pupils are round equal and briskly reactive to light. CN III, IV, VI: extraocular movement are normal. No ptosis. CN V: Facial sensation is intact to light touch CN VII: Face is  symmetric with normal eye closure  CN VIII: Hearing is normal to causal conversation. CN IX, X: Phonation is normal. CN XI: Head turning and shoulder shrug are intact  MOTOR: Left more than right resting hand posturing tremor, mild bradykinesia of left upper and lower extremity, no significant rigidity, mild to moderate difficulty drawing spiral circle , visible tremor also including left more than right lower extremity,  REFLEXES: Reflexes are 1 and symmetric at the biceps, triceps, knees, and ankles. Plantar responses are flexor.  SENSORY: Length-dependent sensory changes  COORDINATION: There is no trunk or limb dysmetria noted.  GAIT/STANCE: Able to get up from seated position arm crossed, moderate stride, increased left hand tremor, cautious  REVIEW OF SYSTEMS:  Full 14 system review of systems performed and notable only for as above All other review of systems were negative.   ALLERGIES: Allergies  Allergen Reactions   Flagyl [Metronidazole Hcl] Itching and Rash   Ciprofloxacin Itching and Rash   Metformin  And Related Rash   Methimazole Rash   Milk-Related Compounds Other (See Comments)    Stomach pains   Other Other (See Comments)    [place] nuts cause severe facial redness    HOME MEDICATIONS: Current Outpatient Medications  Medication Sig Dispense Refill   Accu-Chek Softclix Lancets lancets Use 1-4 times daily as needed/instructed  DX E11.9 360 each 3   apixaban  (ELIQUIS ) 5 MG TABS tablet Take 1 tablet (5 mg total) by mouth 2 (two) times daily. 60 tablet 6   Blood Glucose Monitoring Suppl (ACCU-CHEK GUIDE) w/Device KIT Use 1-4 times daily as needed/directed  DX E11.9 1 kit 1   carbamazepine  (CARBATROL ) 200 MG 12 hr capsule Take 200 mg by mouth 2 (two) times daily.      Cholecalciferol  (QC VITAMIN D3) 50 MCG (2000 UT) TABS Take 2,000 Units by mouth daily.     clobetasol (TEMOVATE) 0.05 % GEL Apply 1 application topically 2 (two) times daily as needed (rash).       Continuous Glucose Receiver (FREESTYLE LIBRE 3 READER) DEVI Use for sensor 1 each 0   Continuous Glucose Sensor (FREESTYLE LIBRE 3 SENSOR) MISC 1 Device by Does not apply route every 14 (fourteen) days. Apply 1 sensor on upper arm every 14 days for continuous glucose monitoring 2 each 2   diltiazem  (CARDIZEM  CD) 240 MG 24 hr capsule TAKE 1 CAPSULE BY MOUTH DAILY 90 capsule 3   donepezil  (ARICEPT ) 10 MG tablet Take 10 mg by mouth at bedtime.     esomeprazole  (NEXIUM ) 40 MG capsule Take 1 capsule (40 mg total) by mouth daily at 12 noon. 30 capsule 2   famotidine  (PEPCID ) 20 MG tablet Take 1 tablet (20 mg total) by mouth 2 (two) times daily. 60 tablet 1   FARXIGA  10 MG TABS tablet TAKE 1 TABLET BY MOUTH DAILY 90 tablet 2   folic acid  (FOLVITE ) 1 MG tablet Take 1 mg by mouth daily.     furosemide  (LASIX ) 40 MG tablet TAKE 1 TABLET BY MOUTH EVERY DAY 90 tablet 1   gabapentin  (NEURONTIN ) 300 MG capsule Take 300 mg by mouth 4 (four) times daily. Patient takes  1 capsule in the am, 1 capsule in the afternoon and two capsules in the evening     glucose blood (ACCU-CHEK GUIDE) test strip Use 1-4 times daily as needed/instructed DX E11.9 300 each 12   HYDROcodone -acetaminophen  (NORCO/VICODIN) 5-325 MG tablet Take 1 tablet by mouth every 6 (six) hours as needed.     injection device for insulin  (CEQUR SIMPLICITY 2U) DEVI 1 each by Other route every 3 (three) days. Apply patch as directed and dose as directed, 5 squeezes  with meal 3 times  a day ( 1 squeeze = 2 units). 24 each 3   Injection Device for Insulin  (CEQUR SIMPLICITY INSERTER) MISC USE AS ADVISED 1 each 0   insulin  aspart (NOVOLOG ) 100 UNIT/ML injection Use up to 40 units / day via CeQur patch 10 mL 11   insulin  glargine (LANTUS  SOLOSTAR) 100 UNIT/ML Solostar Pen Inject 25 Units into the skin daily. 30 mL 4   Insulin  Pen Needle (BD PEN NEEDLE NANO U/F) 32G X 4 MM MISC USE 8 PEN NEEDLES PER DAY 200 each 0   Insulin  Pen Needle (COMFORT EZ PEN NEEDLES)  33G X 5 MM MISC 1 each by Does not apply route daily. Use with Tresiba  pen 100 each 0   Insulin  Pen Needle 32G X 5 MM MISC 1 each by Does not apply route daily. Use with Tresiba  pen 100 each 0   levothyroxine  (SYNTHROID , LEVOTHROID) 75 MCG tablet Take 75 mcg by mouth daily before breakfast.     montelukast  (SINGULAIR ) 5 MG chewable tablet Chew 5 mg by mouth at bedtime.     OLANZapine  (ZYPREXA ) 10 MG tablet Take 20 mg by mouth at bedtime.     olmesartan  (BENICAR ) 20 MG tablet Take 20 mg by mouth daily.     ondansetron  (ZOFRAN ) 4 MG tablet Take 4 mg by mouth every 8 (eight) hours as needed for nausea or vomiting.     OneTouch Delica Lancets 33G MISC Use as directed 200 each 0   pravastatin  (PRAVACHOL ) 20 MG tablet Take 20 mg by mouth daily.     QUEtiapine  (SEROQUEL ) 100 MG tablet Take 100 mg by mouth at bedtime.     rosuvastatin (CRESTOR) 20 MG tablet Take 20 mg by mouth daily.     spironolactone  (ALDACTONE ) 25 MG tablet TAKE 1 TABLET BY MOUTH EVERY DAY 90 tablet 0   tirzepatide  (MOUNJARO ) 12.5 MG/0.5ML Pen Inject 12.5 mg into the skin once a week. 6 mL 4   triamcinolone cream (KENALOG) 0.1 % Apply 1 Application topically as needed.     Vibegron 75 MG TABS Take 75 mg by mouth daily in the afternoon.     No current facility-administered medications for this visit.    PAST MEDICAL HISTORY: Past Medical History:  Diagnosis Date   Bipolar disorder (HCC)    CHF (congestive heart failure) (HCC)    Depression    Bipolar   Dysrhythmia    GERD (gastroesophageal reflux disease)    History of hiatal hernia    History of kidney stones    History of stomach ulcers 1970s   bleeding   Hyperlipidemia    Hypertension    Hypothyroidism    Ischemic colitis, enteritis, or enterocolitis (HCC)    OSA on CPAP    Pericarditis 05/06/2017   Pneumonia    several times (06/23/2017)   Thyroid  disease    TIA (transient ischemic attack) 2011 X 2   Type II diabetes mellitus (HCC)    Urinary bladder  incontinence    Vertigo     PAST SURGICAL HISTORY: Past Surgical History:  Procedure Laterality Date   APPENDECTOMY     BOTOX  INJECTION N/A 11/23/2022   Procedure: CYSTOBOTOX INJECTION;  Surgeon: Erman Hayward, MD;  Location: [facility];  Service: Urology;  Laterality: N/A;   BREAST CYST ASPIRATION Bilateral    CARDIAC CATHETERIZATION N/A 12/24/2014   Procedure: Right/Left Heart Cath and Coronary Angiography;  Surgeon: Luana Rumple, MD;  Location: [facility];  Service: Cardiovascular;  Laterality: N/A;   CATARACT EXTRACTION W/ INTRAOCULAR LENS  IMPLANT, BILATERAL Bilateral    EXCISIONAL HEMORRHOIDECTOMY     EYE SURGERY     FRACTURE SURGERY     IR THORACENTESIS ASP PLEURAL SPACE W/IMG GUIDE  05/13/2017   LAPAROSCOPIC CHOLECYSTECTOMY     LITHOTRIPSY  several times   RETINAL DETACHMENT SURGERY     think it was on the left; not sure (06/23/2017)   TONSILLECTOMY     VAGINAL HYSTERECTOMY     partial     FAMILY HISTORY: Family History  Problem Relation Age of Onset   Heart disease Mother    Stroke Father    Parkinson's disease Father    Cancer Sister    Cancer Brother     SOCIAL HISTORY: Social History   Socioeconomic History   Marital status: Widowed    Spouse name: Not on file   Number of children: 2   Years of education: 58   Highest education level: Not on file  Occupational History   Occupation: Retired  Tobacco Use   Smoking status: Never   Smokeless tobacco: Never  Vaping Use   Vaping status: Never Used  Substance and Sexual Activity   Alcohol use: Never   Drug use: Never   Sexual activity: Not on file  Other Topics Concern   Not on file  Social History Narrative   Lives at home alone.   Right-handed.   No caffeine use.   Social Drivers of Corporate investment banker Strain: Not on file  Food Insecurity: Not on file  Transportation Needs: Not on file  Physical Activity: Not on file  Stress: Not on file  Social Connections: Not on file   Intimate Partner Violence: Not on file      Phebe Brasil, M.D. Ph.D.  [facility] [address] [place], [place] [address] Ph: [phone] Fax: [phone]  CC:  Virgle Grime, MD [address] [place],  [place] [address]  Virgle Grime, MD

## 2023-07-06 ENCOUNTER — Telehealth: Payer: Self-pay | Admitting: Neurology

## 2023-07-06 NOTE — Telephone Encounter (Signed)
 MRI [place]: [id number] exp. 07/06/23-09/04/23, [employer] no auth required  Dat scan no auth required for either insurance. Both sent to [facility] [phone]

## 2023-07-08 LAB — RPR: RPR Ser Ql: NONREACTIVE

## 2023-07-08 LAB — ATN PROFILE: A -- Beta-amyloid 42/40 Ratio: 0.114 (ref >0.102)

## 2023-07-08 LAB — APOE ALZHEIMER'S RISK

## 2023-07-11 NOTE — Telephone Encounter (Signed)
 She has called me multiple times saying she needs a prior authorization. I have checked with her insurance and got what is needed. I will send her a message with this information also.  [employer] dat scan Tracking [phone] and availity tracking [id number]

## 2023-07-11 NOTE — Telephone Encounter (Signed)
 Pt called re: a PA being needed for upcoming DAT scan, the messages from Tori were relayed to pt since she is unable to access her my chart

## 2023-07-13 ENCOUNTER — Ambulatory Visit (HOSPITAL_COMMUNITY)
Admission: RE | Admit: 2023-07-13 | Discharge: 2023-07-13 | Disposition: A | Discharge status: Home / Self Care | Source: Ambulatory Visit | Attending: Neurology | Admitting: Neurology

## 2023-07-13 DIAGNOSIS — I6782 Cerebral ischemia: Secondary | ICD-10-CM | POA: Diagnosis not present

## 2023-07-13 DIAGNOSIS — R269 Unspecified abnormalities of gait and mobility: Secondary | ICD-10-CM | POA: Insufficient documentation

## 2023-07-13 DIAGNOSIS — R413 Other amnesia: Secondary | ICD-10-CM | POA: Diagnosis not present

## 2023-07-13 DIAGNOSIS — R251 Tremor, unspecified: Secondary | ICD-10-CM | POA: Insufficient documentation

## 2023-07-15 LAB — COMPREHENSIVE METABOLIC PANEL WITH GFR
ALT: 32 IU/L (ref 0–32)
AST: 25 IU/L (ref 0–40)
Albumin: 4.4 g/dL (ref 3.7–4.7)
Alkaline Phosphatase: 117 IU/L (ref 44–121)
BUN/Creatinine Ratio: 14 (ref 12–28)
BUN: 18 mg/dL (ref 8–27)
Bilirubin Total: 0.2 mg/dL (ref 0.0–1.2)
CO2: 22 mmol/L (ref 20–29)
Calcium: 9.7 mg/dL (ref 8.7–10.3)
Chloride: 98 mmol/L (ref 96–106)
Creatinine, Ser: 1.31 mg/dL — ABNORMAL HIGH (ref 0.57–1.00)
Globulin, Total: 2.9 g/dL (ref 1.5–4.5)
Glucose: 171 mg/dL — ABNORMAL HIGH (ref 70–99)
Potassium: 5 mmol/L (ref 3.5–5.2)
Sodium: 138 mmol/L (ref 134–144)
Total Protein: 7.3 g/dL (ref 6.0–8.5)
eGFR: 41 mL/min/{1.73_m2} — ABNORMAL LOW (ref >59)

## 2023-07-15 LAB — APOE ALZHEIMER'S RISK

## 2023-07-15 LAB — TSH: TSH: 1.67 u[IU]/mL (ref 0.450–4.500)

## 2023-07-15 LAB — ATN PROFILE
Beta-amyloid 40: 260.7 pg/mL
Beta-amyloid 42: 29.74 pg/mL
N -- NfL, Plasma: 2.07 pg/mL (ref 0.00–9.13)
T -- p-tau181: 0.39 pg/mL (ref 0.00–0.97)

## 2023-07-15 LAB — VITAMIN B12: Vitamin B-12: 347 pg/mL (ref 232–1245)

## 2023-07-15 LAB — RPR

## 2023-07-19 ENCOUNTER — Ambulatory Visit: Payer: Self-pay | Admitting: Neurology

## 2023-07-26 ENCOUNTER — Encounter (HOSPITAL_COMMUNITY)
Admission: RE | Admit: 2023-07-26 | Discharge: 2023-07-26 | Disposition: A | Discharge status: Home / Self Care | Source: Ambulatory Visit | Attending: Neurology | Admitting: Neurology

## 2023-07-26 DIAGNOSIS — R251 Tremor, unspecified: Secondary | ICD-10-CM | POA: Diagnosis present

## 2023-07-26 DIAGNOSIS — R2681 Unsteadiness on feet: Secondary | ICD-10-CM | POA: Diagnosis not present

## 2023-07-26 DIAGNOSIS — R413 Other amnesia: Secondary | ICD-10-CM | POA: Insufficient documentation

## 2023-07-26 DIAGNOSIS — R269 Unspecified abnormalities of gait and mobility: Secondary | ICD-10-CM | POA: Diagnosis not present

## 2023-07-26 MED ORDER — POTASSIUM IODIDE (ANTIDOTE) 130 MG PO TABS
ORAL_TABLET | ORAL | Status: AC
  Filled 2023-07-26: qty 1

## 2023-07-26 MED ORDER — IOFLUPANE I 123 185 MBQ/2.5ML IV SOLN
4.8000 | Freq: Once | INTRAVENOUS | Status: AC | PRN
  Administered 2023-07-26: 4.8 via INTRAVENOUS
  Filled 2023-07-26: qty 5

## 2023-08-04 NOTE — Progress Notes (Signed)
 Please call and inform patient that recent Dat Scan for Parkinson disease was normal. Dr. Onita will contact her if additional recommendations are needed

## 2023-08-10 NOTE — Telephone Encounter (Signed)
 Pt called and was informed of the results. Pt stated that she would like someone to call her and let her know what is the next step then to find out why she is shaking, Please advise.

## 2023-08-11 ENCOUNTER — Telehealth: Admitting: Neurology

## 2023-08-11 ENCOUNTER — Telehealth: Payer: Self-pay | Admitting: Neurology

## 2023-08-11 NOTE — Telephone Encounter (Signed)
 Pt made request to cx appointment for today, she will keep her in office appointment for 7-28 with Dr Onita, pt aware to arrive at 11:15 for 11:45 appointment

## 2023-08-15 ENCOUNTER — Ambulatory Visit (INDEPENDENT_AMBULATORY_CARE_PROVIDER_SITE_OTHER): Admitting: Neurology

## 2023-08-15 ENCOUNTER — Encounter: Payer: Self-pay | Admitting: Neurology

## 2023-08-15 VITALS — BP 108/58 | HR 93 | Ht 63.0 in | Wt 219.4 lb

## 2023-08-15 DIAGNOSIS — R251 Tremor, unspecified: Secondary | ICD-10-CM

## 2023-08-15 DIAGNOSIS — R413 Other amnesia: Secondary | ICD-10-CM

## 2023-08-15 DIAGNOSIS — R269 Unspecified abnormalities of gait and mobility: Secondary | ICD-10-CM | POA: Diagnosis not present

## 2023-08-15 DIAGNOSIS — R479 Unspecified speech disturbances: Secondary | ICD-10-CM

## 2023-08-15 MED ORDER — PROPRANOLOL HCL 10 MG PO TABS: 10.0000 mg | ORAL_TABLET | Freq: Three times a day (TID) | ORAL | 11 refills | Status: DC

## 2023-08-15 NOTE — Progress Notes (Signed)
 Chief Complaint  Patient presents with   Follow-up    Room 15 Patient is alone  Pt is follow up regarding her results and plans for treatment       ASSESSMENT AND PLAN  [name] is a 83 y.o. female   Tremor  mild difficulty drawing spiral circle, resting and posturing tremor, no significant rigidity bradykinesia  Long history of antipsychotic medications due to bipolar disorder, including Zyprexa , which has lower, but still potential for extrapyramidal symptoms,  DatScan  showed no evidence of dopamine deficiency,  Tremor most likely underlying essential tremor, medication side effect can contributed to  Will try Inderal  10 mg as needed, Mildly unsteady gait  Refer her to physical therapy  DIAGNOSTIC DATA (LABS, IMAGING, TESTING) - I reviewed patient records, labs, notes, testing and imaging myself where available.   MEDICAL HISTORY:  [name] is a 83 year old left handed female, seen in request by her primary care from [facility] Dr. Verdia, [place] for evaluation of tremor, memory loss, initial evaluation July 05, 2023  History is obtained from the patient and review of electronic medical records. I personally reviewed pertinent available imaging films in PACS.   PMHx of  DM-insulin  dependent DM peripheral neuropathy HLD HTN Hypothyrodism Afib, CHF Bipolar disorder, polypharmacy, zyprexa  20mg  at bedtime, seroquel  100mg  at bedtime, Carbatrol  200mg  bid Stroke History of kidney stone  Patient is a retired high school Retail buyer, lives alone, drove to clinic today,  Since 2024, she began to develop this tremor, all over her body, gradually getting worse, almost constant, bothersome, but no limitation in her daily activity, I know I got it, I should not have it  She is sedentary, walk half mile few times each week, denies significant gait change, she woke up frequently during her sleep using bathroom, has a long history  of urinary urgency,  She had long history of bipolar disorder, on polypharmacy, including Zyprexa , Seroquel , Carbatrol , because of her tremor, there was a trial with her psychiatrist of lower dose of Zyprexa  without any improvement  She has decreased smell for many years, denied REM sleep disorder, or excessive movement during sleep,  Her mother suffered tremor in her old age, similar to hers, but 5 other sisters do not have similar issues  Labs in June 2025, A1C 7.6, Na 133, creat 1.06, LDL 58,  UPDATE July 28th 2025: She is bothered by her intermittent tremor, but denied major limitation, mild difficulty drawing spiral circle,  MRI of the brain showed mild to moderate small vessel disease, already on Eliquis  due to history of A-fib  DaTSCAN  was negative for dopamine deficiency  Extensive laboratory evaluation were essentially normal, including ATN profile, Alzheimer's genetic testing, there was no treatable etiology identified, normal B12 TSH, RPR  PHYSICAL EXAM:   Vitals:   08/15/23 1111  BP: (!) 108/58  Pulse: 93  Weight: 219 lb 6.4 oz (99.5 kg)  Height: 5' 3 (1.6 m)     PHYSICAL EXAMNIATION:  Gen: NAD, conversant, well nourised, well groomed                     Cardiovascular: Regular rate rhythm, no peripheral edema, warm, nontender. Eyes: Conjunctivae clear without exudates or hemorrhage Neck: Supple, no carotid bruits. Pulmonary: Clear to auscultation bilaterally   NEUROLOGICAL EXAM:  MENTAL STATUS: Speech/cognition: Anxious looking elderly female, awake, alert, oriented to history taking and casual conversation    07/05/2023   11:00 AM  Montreal Cognitive Assessment  Visuospatial/ Executive (0/5) 5  Naming (0/3) 3  Attention: Read list of digits (0/2) 2  Attention: Read list of letters (0/1) 1  Attention: Serial 7 subtraction starting at 100 (0/3) 1  Language: Repeat phrase (0/2) 2  Language : Fluency (0/1) 0  Abstraction (0/2) 2  Delayed Recall (0/5) 2   Orientation (0/6) 5  Total 23    CRANIAL NERVES: CN II: Visual fields are full to confrontation. Pupils are round equal and briskly reactive to light. CN III, IV, VI: extraocular movement are normal. No ptosis. CN V: Facial sensation is intact to light touch CN VII: Face is symmetric with normal eye closure  CN VIII: Hearing is normal to causal conversation. CN IX, X: Phonation is normal. CN XI: Head turning and shoulder shrug are intact  MOTOR: Mild bilateral hands tremor, no significant rigidity, bradykinesia,  REFLEXES: Reflexes are 1 and symmetric at the biceps, triceps, knees, and ankles. Plantar responses are flexor.  SENSORY: Length-dependent sensory changes  COORDINATION: There is no trunk or limb dysmetria noted.  GAIT/STANCE: Able to get up from seated position arm crossed, moderate stride, increased left hand tremor, cautious  REVIEW OF SYSTEMS:  Full 14 system review of systems performed and notable only for as above All other review of systems were negative.   ALLERGIES: Allergies  Allergen Reactions   Flagyl [Metronidazole Hcl] Itching and Rash   Ciprofloxacin Itching and Rash   Metformin  And Related Rash   Methimazole Rash   Milk-Related Compounds Other (See Comments)    Stomach pains   Other Other (See Comments)    [place] nuts cause severe facial redness    HOME MEDICATIONS: Current Outpatient Medications  Medication Sig Dispense Refill   Accu-Chek Softclix Lancets lancets Use 1-4 times daily as needed/instructed  DX E11.9 360 each 3   apixaban  (ELIQUIS ) 5 MG TABS tablet Take 1 tablet (5 mg total) by mouth 2 (two) times daily. 60 tablet 6   Blood Glucose Monitoring Suppl (ACCU-CHEK GUIDE) w/Device KIT Use 1-4 times daily as needed/directed  DX E11.9 1 kit 1   carbamazepine  (CARBATROL ) 200 MG 12 hr capsule Take 200 mg by mouth 2 (two) times daily.      Cholecalciferol  (QC VITAMIN D3) 50 MCG (2000 UT) TABS Take 2,000 Units by mouth daily.      clobetasol (TEMOVATE) 0.05 % GEL Apply 1 application topically 2 (two) times daily as needed (rash).      Continuous Glucose Receiver (FREESTYLE LIBRE 3 READER) DEVI Use for sensor 1 each 0   Continuous Glucose Sensor (FREESTYLE LIBRE 3 SENSOR) MISC 1 Device by Does not apply route every 14 (fourteen) days. Apply 1 sensor on upper arm every 14 days for continuous glucose monitoring 2 each 2   diltiazem  (CARDIZEM  CD) 240 MG 24 hr capsule TAKE 1 CAPSULE BY MOUTH DAILY 90 capsule 3   donepezil  (ARICEPT ) 10 MG tablet Take 10 mg by mouth at bedtime.     esomeprazole  (NEXIUM ) 40 MG capsule Take 1 capsule (40 mg total) by mouth daily at 12 noon. 30 capsule 2   famotidine  (PEPCID ) 20 MG tablet Take 1 tablet (20 mg total) by mouth 2 (two) times daily. 60 tablet 1   FARXIGA  10 MG TABS tablet TAKE 1 TABLET BY MOUTH DAILY 90 tablet 2   folic acid  (FOLVITE ) 1 MG tablet Take 1 mg by mouth daily.     furosemide  (LASIX ) 40 MG tablet TAKE 1 TABLET BY MOUTH EVERY DAY 90 tablet 1  gabapentin  (NEURONTIN ) 300 MG capsule Take 300 mg by mouth 4 (four) times daily. Patient takes 1 capsule in the am, 1 capsule in the afternoon and two capsules in the evening     glucose blood (ACCU-CHEK GUIDE) test strip Use 1-4 times daily as needed/instructed DX E11.9 300 each 12   HYDROcodone -acetaminophen  (NORCO/VICODIN) 5-325 MG tablet Take 1 tablet by mouth every 6 (six) hours as needed.     injection device for insulin  (CEQUR SIMPLICITY 2U) DEVI 1 each by Other route every 3 (three) days. Apply patch as directed and dose as directed, 5 squeezes  with meal 3 times  a day ( 1 squeeze = 2 units). 24 each 3   Injection Device for Insulin  (CEQUR SIMPLICITY INSERTER) MISC USE AS ADVISED 1 each 0   insulin  aspart (NOVOLOG ) 100 UNIT/ML injection Use up to 40 units / day via CeQur patch 10 mL 11   insulin  glargine (LANTUS  SOLOSTAR) 100 UNIT/ML Solostar Pen Inject 25 Units into the skin daily. 30 mL 4   Insulin  Pen Needle (BD PEN NEEDLE NANO  U/F) 32G X 4 MM MISC USE 8 PEN NEEDLES PER DAY 200 each 0   Insulin  Pen Needle (COMFORT EZ PEN NEEDLES) 33G X 5 MM MISC 1 each by Does not apply route daily. Use with Tresiba  pen 100 each 0   Insulin  Pen Needle 32G X 5 MM MISC 1 each by Does not apply route daily. Use with Tresiba  pen 100 each 0   levothyroxine  (SYNTHROID , LEVOTHROID) 75 MCG tablet Take 75 mcg by mouth daily before breakfast.     montelukast  (SINGULAIR ) 5 MG chewable tablet Chew 5 mg by mouth at bedtime.     OLANZapine  (ZYPREXA ) 10 MG tablet Take 20 mg by mouth at bedtime.     olmesartan  (BENICAR ) 20 MG tablet Take 20 mg by mouth daily.     ondansetron  (ZOFRAN ) 4 MG tablet Take 4 mg by mouth every 8 (eight) hours as needed for nausea or vomiting.     OneTouch Delica Lancets 33G MISC Use as directed 200 each 0   pravastatin  (PRAVACHOL ) 20 MG tablet Take 20 mg by mouth daily.     QUEtiapine  (SEROQUEL ) 100 MG tablet Take 100 mg by mouth at bedtime.     rosuvastatin (CRESTOR) 20 MG tablet Take 20 mg by mouth daily.     spironolactone  (ALDACTONE ) 25 MG tablet TAKE 1 TABLET BY MOUTH EVERY DAY 90 tablet 0   tirzepatide  (MOUNJARO ) 12.5 MG/0.5ML Pen Inject 12.5 mg into the skin once a week. 6 mL 4   triamcinolone cream (KENALOG) 0.1 % Apply 1 Application topically as needed.     Vibegron 75 MG TABS Take 75 mg by mouth daily in the afternoon.     No current facility-administered medications for this visit.    PAST MEDICAL HISTORY: Past Medical History:  Diagnosis Date   Bipolar disorder (HCC)    CHF (congestive heart failure) (HCC)    Depression    Bipolar   Dysrhythmia    GERD (gastroesophageal reflux disease)    History of hiatal hernia    History of kidney stones    History of stomach ulcers 1970s   bleeding   Hyperlipidemia    Hypertension    Hypothyroidism    Ischemic colitis, enteritis, or enterocolitis (HCC)    OSA on CPAP    Pericarditis 05/06/2017   Pneumonia    several times (06/23/2017)   Thyroid  disease     TIA (transient ischemic  attack) 2011 X 2   Type II diabetes mellitus (HCC)    Urinary bladder incontinence    Vertigo     PAST SURGICAL HISTORY: Past Surgical History:  Procedure Laterality Date   APPENDECTOMY     BOTOX  INJECTION N/A 11/23/2022   Procedure: CYSTOBOTOX INJECTION;  Surgeon: Gaston Hamilton, MD;  Location: [facility];  Service: Urology;  Laterality: N/A;   BREAST CYST ASPIRATION Bilateral    CARDIAC CATHETERIZATION N/A 12/24/2014   Procedure: Right/Left Heart Cath and Coronary Angiography;  Surgeon: Jerel Balding, MD;  Location: [facility];  Service: Cardiovascular;  Laterality: N/A;   CATARACT EXTRACTION W/ INTRAOCULAR LENS  IMPLANT, BILATERAL Bilateral    EXCISIONAL HEMORRHOIDECTOMY     EYE SURGERY     FRACTURE SURGERY     IR THORACENTESIS ASP PLEURAL SPACE W/IMG GUIDE  05/13/2017   LAPAROSCOPIC CHOLECYSTECTOMY     LITHOTRIPSY  several times   RETINAL DETACHMENT SURGERY     think it was on the left; not sure (06/23/2017)   TONSILLECTOMY     VAGINAL HYSTERECTOMY     partial     FAMILY HISTORY: Family History  Problem Relation Age of Onset   Heart disease Mother    Stroke Father    Parkinson's disease Father    Cancer Sister    Cancer Brother     SOCIAL HISTORY: Social History   Socioeconomic History   Marital status: Widowed    Spouse name: Not on file   Number of children: 2   Years of education: 59   Highest education level: Not on file  Occupational History   Occupation: Retired  Tobacco Use   Smoking status: Never   Smokeless tobacco: Never  Vaping Use   Vaping status: Never Used  Substance and Sexual Activity   Alcohol use: Never   Drug use: Never   Sexual activity: Not on file  Other Topics Concern   Not on file  Social History Narrative   Lives at home alone.   Right-handed.   No caffeine use.   Social Drivers of Corporate investment banker Strain: Not on file  Food Insecurity: Not on file  Transportation Needs: Not  on file  Physical Activity: Not on file  Stress: Not on file  Social Connections: Not on file  Intimate Partner Violence: Not on file      Modena Callander, M.D. Ph.D.  [facility] [address] [place], [place] [address] Ph: [phone] Fax: [phone]  CC:  Verdia Lombard, MD [address] [place],  [place] [address]  Verdia Lombard, MD

## 2023-08-16 ENCOUNTER — Telehealth: Payer: Self-pay | Admitting: Neurology

## 2023-08-16 NOTE — Telephone Encounter (Signed)
 Patient called and wanted to know if she could take the propranolol  with farxiga  due to propranolol  being in farxiga . Upon further review, the patient indicated the looked similar but were different. Advised to continue medications as prescribed. Patient verbalized understanding and in agreement

## 2023-08-16 NOTE — Telephone Encounter (Signed)
 Pt called wanting to discuss her medications. Pt is worried about mixing two. Please advise.

## 2023-08-26 DIAGNOSIS — N3946 Mixed incontinence: Secondary | ICD-10-CM | POA: Diagnosis not present

## 2023-08-26 DIAGNOSIS — R350 Frequency of micturition: Secondary | ICD-10-CM | POA: Diagnosis not present

## 2023-09-01 ENCOUNTER — Other Ambulatory Visit: Payer: Self-pay

## 2023-09-01 ENCOUNTER — Encounter: Payer: Self-pay | Admitting: Physical Therapy

## 2023-09-01 ENCOUNTER — Ambulatory Visit: Attending: Neurology | Admitting: Physical Therapy

## 2023-09-01 DIAGNOSIS — M6281 Muscle weakness (generalized): Secondary | ICD-10-CM | POA: Diagnosis not present

## 2023-09-01 DIAGNOSIS — R2689 Other abnormalities of gait and mobility: Secondary | ICD-10-CM | POA: Insufficient documentation

## 2023-09-01 DIAGNOSIS — R269 Unspecified abnormalities of gait and mobility: Secondary | ICD-10-CM | POA: Insufficient documentation

## 2023-09-01 DIAGNOSIS — R413 Other amnesia: Secondary | ICD-10-CM | POA: Diagnosis not present

## 2023-09-01 DIAGNOSIS — R251 Tremor, unspecified: Secondary | ICD-10-CM | POA: Insufficient documentation

## 2023-09-01 DIAGNOSIS — R2681 Unsteadiness on feet: Secondary | ICD-10-CM | POA: Insufficient documentation

## 2023-09-01 NOTE — Therapy (Signed)
 OUTPATIENT PHYSICAL THERAPY NEURO EVALUATION   Patient Name: [name] MRN: [id number] DOB:April 14, 1940, 83 y.o., female Today's Date: 09/01/2023   PCP: Verdia Lombard, MD REFERRING PROVIDER: Onita Duos, MD  END OF SESSION:  PT End of Session - 09/01/23 1122     Visit Number 1    Authorization Type [employer]    Progress Note Due on Visit 10    PT Start Time 1122    PT Stop Time 1200    PT Time Calculation (min) 38 min    Activity Tolerance Patient tolerated treatment well          Past Medical History:  Diagnosis Date   Bipolar disorder (HCC)    CHF (congestive heart failure) (HCC)    Depression    Bipolar   Dysrhythmia    GERD (gastroesophageal reflux disease)    History of hiatal hernia    History of kidney stones    History of stomach ulcers 1970s   bleeding   Hyperlipidemia    Hypertension    Hypothyroidism    Ischemic colitis, enteritis, or enterocolitis (HCC)    OSA on CPAP    Pericarditis 05/06/2017   Pneumonia    several times (06/23/2017)   Thyroid  disease    TIA (transient ischemic attack) 2011 X 2   Type II diabetes mellitus (HCC)    Urinary bladder incontinence    Vertigo    Past Surgical History:  Procedure Laterality Date   APPENDECTOMY     BOTOX  INJECTION N/A 11/23/2022   Procedure: CYSTOBOTOX INJECTION;  Surgeon: Gaston Hamilton, MD;  Location: [facility];  Service: Urology;  Laterality: N/A;   BREAST CYST ASPIRATION Bilateral    CARDIAC CATHETERIZATION N/A 12/24/2014   Procedure: Right/Left Heart Cath and Coronary Angiography;  Surgeon: Jerel Balding, MD;  Location: [facility];  Service: Cardiovascular;  Laterality: N/A;   CATARACT EXTRACTION W/ INTRAOCULAR LENS  IMPLANT, BILATERAL Bilateral    EXCISIONAL HEMORRHOIDECTOMY     EYE SURGERY     FRACTURE SURGERY     IR THORACENTESIS ASP PLEURAL SPACE W/IMG GUIDE  05/13/2017   LAPAROSCOPIC CHOLECYSTECTOMY     LITHOTRIPSY  several times   RETINAL DETACHMENT  SURGERY     think it was on the left; not sure (06/23/2017)   TONSILLECTOMY     VAGINAL HYSTERECTOMY     partial    Patient Active Problem List   Diagnosis Date Noted   Tremor 07/05/2023   Memory loss 07/05/2023   Acquired hypothyroidism 07/04/2023   Bipolar 1 disorder (HCC) 07/04/2023   Chronic diastolic congestive heart failure (HCC) 07/04/2023   Chronic kidney disease, stage 3a (HCC) 07/04/2023   Current mild episode of major depressive disorder without prior episode (HCC) 07/04/2023   Encounter for general adult medical examination without abnormal findings 07/04/2023   Gastroesophageal reflux disease with esophagitis 07/04/2023   Hyperglycemia due to type 2 diabetes mellitus (HCC) 07/04/2023   Hypertensive heart and chronic kidney disease with heart failure and stage 1 through stage 4 chronic kidney disease, or unspecified chronic kidney disease (HCC) 07/04/2023   Hypocalcemia 07/04/2023   Long term current use of bisphosphonates 07/04/2023   Mixed incontinence 07/04/2023   Gait abnormality 07/04/2023   Vitamin D  deficiency 07/04/2023   CAP (community acquired pneumonia) 08/21/2020   Sepsis (HCC) 08/21/2020   Acute metabolic encephalopathy 08/21/2020   Elevated troponin 08/21/2020   Abdominal pain 08/21/2020   Acute lower UTI 06/22/2017   AKI (acute kidney injury) (HCC)  06/22/2017   Syncope 06/22/2017   Staring episodes 05/21/2017   Hyponatremia 05/21/2017   Bilateral pleural effusion    Pleural effusion on left    Cough    History of pericarditis 05/06/2017   Pericardial effusion 05/04/2017   OSA (obstructive sleep apnea) 02/18/2017   Dyspnea 01/27/2015   Atypical chest pain 12/24/2014   Accelerating angina (HCC) 12/23/2014   Chest pain at rest 10/30/2013   Exertional dyspnea 10/30/2013   Hyperlipidemia 10/30/2013   Morbid (severe) obesity due to excess calories (HCC) 10/30/2013   Acquired autoimmune hypothyroidism 09/02/2013   Type 2 diabetes mellitus without  complications (HCC) 06/14/2013   Hypertension with renal disease 06/14/2013   Mixed hyperlipidemia 06/14/2013   Atopic dermatitis 12/10/2011   Pruritus 12/10/2011    ONSET DATE: Past few years  REFERRING DIAG:  R25.1 (ICD-10-CM) - Tremor  R26.9 (ICD-10-CM) - Gait abnormality  R41.3 (ICD-10-CM) - Memory loss    THERAPY DIAG:  Other abnormalities of gait and mobility  Muscle weakness (generalized)  Unsteadiness on feet  Rationale for Evaluation and Treatment: Rehabilitation  SUBJECTIVE:                                                                                                                                                                                             SUBJECTIVE STATEMENT: Pt states she is unable to bring up words while talking. Pt had brain scan and saw white matter changes. Reports this is her main concern but she does stumble when she walks. Pt states she has a tremor.  Pt accompanied by: self  PERTINENT HISTORY:   DM, CHF, A-fib, hypothyroidism, long history of antipsychotic medications due to bipolar disorder  PAIN:  Are you having pain? No  PRECAUTIONS: None  RED FLAGS: None   WEIGHT BEARING RESTRICTIONS: No  FALLS: Has patient fallen in last 6 months? No falls but stumbles  LIVING ENVIRONMENT: Lives with: lives alone Lives in: House/apartment Stairs: None Has following equipment at home: Single point cane, Environmental consultant - 2 wheeled, Wheelchair (manual), and shower chair  PLOF: Independent; likes to watch TV, goes out to lunch  PATIENT GOALS: Decrease stumbling, improve word finding  OBJECTIVE:  Note: Objective measures were completed at Evaluation unless otherwise noted.  DIAGNOSTIC FINDINGS: Brain MRI 07/13/23 IMPRESSION: 1. No acute intracranial abnormality. 2. Progression of white matter changes of chronic small vessel ischemia.  Brain DaTSCAN  07/26/23 IMPRESSION: Ioflupane scan within normal limits. No reduced radiotracer  activity in basal ganglia to suggest Parkinson's syndrome pathology.   Of note, DaTSCAN  is not diagnostic of Parkinsonian syndromes, which remains a clinical diagnosis. DaTscan  is an adjuvant  test to aid in the clinical diagnosis of Parkinsonian syndromes.  COGNITION: Overall cognitive status: Reports word finding   SENSATION: Neuropathy just in feet and hands -- taking gabapentin   COORDINATION: Essential tremors noted L UE worse than R UE  EDEMA:  Reports bilat LE edema to her knees -- wears stockings  MUSCLE TONE: None  MUSCLE LENGTH: Did not assess  POSTURE: forward head and increased thoracic kyphosis  LOWER EXTREMITY ROM:     Active  Right Eval Left Eval  Hip flexion    Hip extension    Hip abduction    Hip adduction    Hip internal rotation    Hip external rotation    Knee flexion    Knee extension    Ankle dorsiflexion    Ankle plantarflexion    Ankle inversion    Ankle eversion     (Blank rows = not tested)  LOWER EXTREMITY MMT:    MMT Right Eval Left Eval  Hip flexion 4 4  Hip extension Holding bridge 34 sec  Hip abduction 3+ 3  Hip adduction    Hip internal rotation    Hip external rotation    Knee flexion 4 4-  Knee extension 4+ 4  Ankle dorsiflexion 3+ 3+  Ankle plantarflexion    Ankle inversion    Ankle eversion    (Blank rows = not tested)  BED MOBILITY:  Not tested  TRANSFERS: Sit to stand: Complete Independence  Assistive device utilized: None     Stand to sit: Complete Independence  Assistive device utilized: None      RAMP:  Not tested  CURB:  Not tested  STAIRS: Not tested GAIT: Findings: Gait Characteristics: step through pattern, decreased arm swing- Right, decreased arm swing- Left, decreased step length- Right, decreased step length- Left, decreased stride length, Right foot flat, Left foot flat, lateral lean- Right, lateral lean- Left, wide BOS, abducted- Right, and abducted- Left, Distance walked: Into clinic,  Level of assistance: SBA, and Comments: UEs in high guard position.  Gait speed: 0.52 m/s  FUNCTIONAL TESTS:  5 times sit to stand: 18 sec Timed up and go (TUG): 18.4 sec Berg Balance Scale:  Item Test date: 09/01/23 Test date:  Test date:   Sitting to standing 4. able to stand without using hands and stabilize independently Insert OPRCBERGREEVAL SmartPhrase at re-test date Insert OPRCBERGREEVAL SmartPhrase at re-test date  2. Standing unsupported 3. able to stand 2 minutes with supervision Limited by tremors increasing as time increased    3. Sitting with back unsupported, feet supported 4. able to sit safely and securely for 2 minutes    4. Standing to sitting 4. sits safely with minimal use of hands    5. Pivot transfer  3. able to transfer safely with definite need of hands    6. Standing unsupported with eyes closed 3. able to stand 10 seconds with supervision    7. Standing unsupported with feet together 2. able to place feet together independently but unable to hold for 30 seconds    8. Reaching forward with outstretched arms while standing 3. can reach forward 12 cm (5 inches)    9. Pick up object from the floor from standing 0. unable to try/needs assistance to keep from losing balance or falling    10. Turning to look behind over left and right shoulders while standing 4. looks behind from both sides and weight shifts well    11. Turn 360 degrees 2. able to  turn 360 degrees safely but slowly 7 sec    12. Place alternate foot on step or stool while standing unsupported 2. able to complete 4 steps without aid with supervision    13. Standing unsupported one foot in front 1. needs help to step but can hold 15 seconds    14. Standing on one leg 1. tries to lift leg unable to hold 3 seconds but remains standing independently.      Total Score 36/56 Total Score TBA/56 Total Score TBA/56     PATIENT SURVEYS:  Did not assess                                                                                                                               TREATMENT DATE: 09/01/23 Education only today    PATIENT EDUCATION: Education details: Exam findings, POC, obtaining SLP referral Person educated: Patient Education method: Medical illustrator Education comprehension: verbalized understanding, returned demonstration, and needs further education  HOME EXERCISE PROGRAM: To be initiated  GOALS: Goals reviewed with patient? Yes  SHORT TERM GOALS: Target date: 09/29/2023   Pt will be ind with initial HEP Baseline: Goal status: INITIAL  2.  Pt will have improved 5 x STS to </=15 sec Baseline: 18 Goal status: INITIAL  3.  Pt will be able to maintain static standing on compliant surface x 2 min independently for improved stability Baseline: Challenged to maintain 2 min on firm surface due to tremors increasing Goal status: INITIAL    LONG TERM GOALS: Target date: 10/27/2023   Pt will be ind with management and progression of HEP Baseline:  Goal status: INITIAL  2.  Pt will demo improved 5x STS to </=13 sec for increased functional LE strength Baseline: 18 sec Goal status: INITIAL  3.  Pt will have improved Berg Balance Score to >/=45 for reduced fall risk Baseline: 36 Goal status: INITIAL  4.  Pt will have improved TUG to </=15 sec to demo reduced fall risk Baseline: 18 sec Goal status: INITIAL    ASSESSMENT:  CLINICAL IMPRESSION: Patient is a 83 y.o. F who was seen today for physical therapy evaluation and treatment for gait instability. PMH is significant for bipolar managed with multiple antipsychotics and DM with neuropathy. Pt demos an essential tremor that increases with muscle fatigue/prolonged standing. Pt is not very active at baseline but is independent with all of her household tasks; however, assessment finds that pt is at a high fall risk based on her Berg Balance, TUG and 5x STS. Pt is grossly weak with limited endurance in bilat LEs.  She will highly benefit from PT to improve on these deficits to increase her safety at home and in the community. Pt is noting issues with word finding and may benefit from SLP assessment.   OBJECTIVE IMPAIRMENTS: Abnormal gait, decreased activity tolerance, decreased balance, decreased coordination, decreased endurance, decreased mobility, difficulty walking, decreased strength, impaired UE functional use, improper body  mechanics, and postural dysfunction.   ACTIVITY LIMITATIONS: carrying, lifting, bending, standing, squatting, stairs, transfers, and locomotion level  PARTICIPATION LIMITATIONS: meal prep, cleaning, laundry, shopping, and community activity  PERSONAL FACTORS: Age, Fitness, Past/current experiences, and Time since onset of injury/illness/exacerbation are also affecting patient's functional outcome.   REHAB POTENTIAL: Good  CLINICAL DECISION MAKING: Evolving/moderate complexity  EVALUATION COMPLEXITY: Moderate  PLAN:  PT FREQUENCY: 2x/week  PT DURATION: 8 weeks  PLANNED INTERVENTIONS: [id number]- PT Re-evaluation, 97750- Physical Performance Testing, 97110-Therapeutic exercises, 97530- Therapeutic activity, [id number]- Neuromuscular re-education, 97535- Self Care, [id number]- Manual therapy, [phone]- Gait training, [phone]- Aquatic Therapy, Patient/Family education, Balance training, Stair training, Taping, Joint mobilization, Spinal mobilization, Cryotherapy, and Moist heat  PLAN FOR NEXT SESSION: Initiate HEP. Work on stabilizing exercise (consider isometrics and long holds). Core/LE strengthening. Basic balance. Did Dr. Onita put in an SLP referral?    Sherrilyn Nairn April Ma L Jeannetta Cerutti, [place], DPT 09/01/2023, 12:31 PM  Referring diagnosis?  R25.1 (ICD-10-CM) - Tremor  R26.9 (ICD-10-CM) - Gait abnormality  R41.3 (ICD-10-CM) - Memory loss   Treatment diagnosis? (if different than referring diagnosis) Other abnormalities of gait and mobility [R26.89]  Muscle weakness (generalized) [M62.81]  What  was this (referring dx) caused by? []  Surgery []  Fall [x]  Ongoing issue []  Arthritis []  Other: ____________  Laterality: []  Rt []  Lt [x]  Both  Check all possible CPT codes:  *CHOOSE 10 OR LESS*    See Planned Interventions listed in the Plan section of the Evaluation.

## 2023-09-01 NOTE — Addendum Note (Signed)
 Addended by: Jillayne Witte on: 09/01/2023 02:20 PM   Modules accepted: Orders

## 2023-09-06 ENCOUNTER — Telehealth: Payer: Self-pay | Admitting: Physical Therapy

## 2023-09-06 ENCOUNTER — Ambulatory Visit: Admitting: Physical Therapy

## 2023-09-06 ENCOUNTER — Encounter (HOSPITAL_COMMUNITY): Payer: Self-pay

## 2023-09-06 ENCOUNTER — Emergency Department (HOSPITAL_COMMUNITY)
Admission: EM | Admit: 2023-09-06 | Discharge: 2023-09-06 | Disposition: A | Discharge status: Home / Self Care | Attending: Emergency Medicine | Admitting: Emergency Medicine

## 2023-09-06 ENCOUNTER — Encounter: Payer: Self-pay | Admitting: Physical Therapy

## 2023-09-06 ENCOUNTER — Emergency Department (HOSPITAL_COMMUNITY)

## 2023-09-06 ENCOUNTER — Other Ambulatory Visit: Payer: Self-pay

## 2023-09-06 DIAGNOSIS — M6281 Muscle weakness (generalized): Secondary | ICD-10-CM

## 2023-09-06 DIAGNOSIS — R109 Unspecified abdominal pain: Secondary | ICD-10-CM | POA: Diagnosis not present

## 2023-09-06 DIAGNOSIS — R079 Chest pain, unspecified: Secondary | ICD-10-CM | POA: Diagnosis not present

## 2023-09-06 DIAGNOSIS — I5032 Chronic diastolic (congestive) heart failure: Secondary | ICD-10-CM | POA: Diagnosis not present

## 2023-09-06 DIAGNOSIS — K760 Fatty (change of) liver, not elsewhere classified: Secondary | ICD-10-CM | POA: Diagnosis not present

## 2023-09-06 DIAGNOSIS — F32A Depression, unspecified: Secondary | ICD-10-CM | POA: Diagnosis not present

## 2023-09-06 DIAGNOSIS — R0789 Other chest pain: Secondary | ICD-10-CM | POA: Insufficient documentation

## 2023-09-06 DIAGNOSIS — R2681 Unsteadiness on feet: Secondary | ICD-10-CM | POA: Diagnosis not present

## 2023-09-06 DIAGNOSIS — E785 Hyperlipidemia, unspecified: Secondary | ICD-10-CM | POA: Diagnosis not present

## 2023-09-06 DIAGNOSIS — K5730 Diverticulosis of large intestine without perforation or abscess without bleeding: Secondary | ICD-10-CM | POA: Insufficient documentation

## 2023-09-06 DIAGNOSIS — R413 Other amnesia: Secondary | ICD-10-CM | POA: Diagnosis not present

## 2023-09-06 DIAGNOSIS — I517 Cardiomegaly: Secondary | ICD-10-CM | POA: Diagnosis not present

## 2023-09-06 DIAGNOSIS — R7989 Other specified abnormal findings of blood chemistry: Secondary | ICD-10-CM

## 2023-09-06 DIAGNOSIS — R2689 Other abnormalities of gait and mobility: Secondary | ICD-10-CM

## 2023-09-06 DIAGNOSIS — R251 Tremor, unspecified: Secondary | ICD-10-CM | POA: Diagnosis not present

## 2023-09-06 DIAGNOSIS — Z794 Long term (current) use of insulin: Secondary | ICD-10-CM | POA: Insufficient documentation

## 2023-09-06 DIAGNOSIS — R4182 Altered mental status, unspecified: Secondary | ICD-10-CM | POA: Diagnosis not present

## 2023-09-06 DIAGNOSIS — R0602 Shortness of breath: Secondary | ICD-10-CM | POA: Diagnosis not present

## 2023-09-06 DIAGNOSIS — I700 Atherosclerosis of aorta: Secondary | ICD-10-CM | POA: Insufficient documentation

## 2023-09-06 DIAGNOSIS — G259 Extrapyramidal and movement disorder, unspecified: Secondary | ICD-10-CM | POA: Diagnosis not present

## 2023-09-06 DIAGNOSIS — I4892 Unspecified atrial flutter: Secondary | ICD-10-CM

## 2023-09-06 DIAGNOSIS — R9431 Abnormal electrocardiogram [ECG] [EKG]: Secondary | ICD-10-CM | POA: Diagnosis not present

## 2023-09-06 DIAGNOSIS — G4730 Sleep apnea, unspecified: Secondary | ICD-10-CM | POA: Diagnosis not present

## 2023-09-06 DIAGNOSIS — I10 Essential (primary) hypertension: Secondary | ICD-10-CM

## 2023-09-06 DIAGNOSIS — R269 Unspecified abnormalities of gait and mobility: Secondary | ICD-10-CM | POA: Diagnosis not present

## 2023-09-06 DIAGNOSIS — E1165 Type 2 diabetes mellitus with hyperglycemia: Secondary | ICD-10-CM | POA: Diagnosis not present

## 2023-09-06 DIAGNOSIS — Z7901 Long term (current) use of anticoagulants: Secondary | ICD-10-CM | POA: Insufficient documentation

## 2023-09-06 DIAGNOSIS — K219 Gastro-esophageal reflux disease without esophagitis: Secondary | ICD-10-CM | POA: Diagnosis not present

## 2023-09-06 DIAGNOSIS — K449 Diaphragmatic hernia without obstruction or gangrene: Secondary | ICD-10-CM | POA: Diagnosis not present

## 2023-09-06 DIAGNOSIS — G252 Other specified forms of tremor: Secondary | ICD-10-CM | POA: Diagnosis not present

## 2023-09-06 DIAGNOSIS — Z8673 Personal history of transient ischemic attack (TIA), and cerebral infarction without residual deficits: Secondary | ICD-10-CM | POA: Diagnosis not present

## 2023-09-06 DIAGNOSIS — I119 Hypertensive heart disease without heart failure: Secondary | ICD-10-CM | POA: Diagnosis not present

## 2023-09-06 LAB — CBC
HCT: 40.4 % (ref 36.0–46.0)
Hemoglobin: 12.8 g/dL (ref 12.0–15.0)
MCH: 27.5 pg (ref 26.0–34.0)
MCHC: 31.7 g/dL (ref 30.0–36.0)
MCV: 86.9 fL (ref 80.0–100.0)
Platelets: 331 K/uL (ref 150–400)
RBC: 4.65 MIL/uL (ref 3.87–5.11)
RDW: 13.7 % (ref 11.5–15.5)
WBC: 7.1 K/uL (ref 4.0–10.5)
nRBC: 0 % (ref 0.0–0.2)

## 2023-09-06 LAB — COMPREHENSIVE METABOLIC PANEL WITH GFR
ALT: 38 U/L (ref 0–44)
AST: 24 U/L (ref 15–41)
Albumin: 3.5 g/dL (ref 3.5–5.0)
Alkaline Phosphatase: 83 U/L (ref 38–126)
Anion gap: 14 (ref 5–15)
BUN: 28 mg/dL — ABNORMAL HIGH (ref 8–23)
CO2: 24 mmol/L (ref 22–32)
Calcium: 9.1 mg/dL (ref 8.9–10.3)
Chloride: 104 mmol/L (ref 98–111)
Creatinine, Ser: 1.22 mg/dL — ABNORMAL HIGH (ref 0.44–1.00)
GFR, Estimated: 44 mL/min — ABNORMAL LOW (ref >60)
Glucose, Bld: 326 mg/dL — ABNORMAL HIGH (ref 70–99)
Potassium: 4.6 mmol/L (ref 3.5–5.1)
Sodium: 142 mmol/L (ref 135–145)
Total Bilirubin: 0.4 mg/dL (ref 0.0–1.2)
Total Protein: 6.8 g/dL (ref 6.5–8.1)

## 2023-09-06 LAB — URINALYSIS, ROUTINE W REFLEX MICROSCOPIC
Bacteria, UA: NONE SEEN
Bilirubin Urine: NEGATIVE
Glucose, UA: >=500 mg/dL — AB
Hgb urine dipstick: NEGATIVE
Ketones, ur: NEGATIVE mg/dL
Nitrite: NEGATIVE
Protein, ur: NEGATIVE mg/dL
Specific Gravity, Urine: >1.046 — ABNORMAL HIGH (ref 1.005–1.030)
pH: 7 (ref 5.0–8.0)

## 2023-09-06 LAB — TROPONIN I (HIGH SENSITIVITY)
Troponin I (High Sensitivity): 13 ng/L (ref <18)
Troponin I (High Sensitivity): 19 ng/L — ABNORMAL HIGH (ref <18)

## 2023-09-06 LAB — CBG MONITORING, ED: Glucose-Capillary: 177 mg/dL — ABNORMAL HIGH (ref 70–99)

## 2023-09-06 LAB — LIPASE, BLOOD: Lipase: 60 U/L — ABNORMAL HIGH (ref 11–51)

## 2023-09-06 MED ORDER — LORAZEPAM 2 MG/ML IJ SOLN
1.0000 mg | Freq: Once | INTRAMUSCULAR | Status: AC
  Administered 2023-09-06: 1 mg via INTRAVENOUS
  Filled 2023-09-06: qty 1

## 2023-09-06 MED ORDER — PROPRANOLOL HCL 10 MG PO TABS
10.0000 mg | ORAL_TABLET | Freq: Once | ORAL | Status: AC
  Administered 2023-09-06: 10 mg via ORAL
  Filled 2023-09-06: qty 1

## 2023-09-06 MED ORDER — LORAZEPAM 2 MG/ML IJ SOLN: 1.0000 mg | Freq: Once | INTRAMUSCULAR | Status: AC

## 2023-09-06 MED ORDER — IOHEXOL 350 MG/ML SOLN
75.0000 mL | Freq: Once | INTRAVENOUS | Status: AC | PRN
  Administered 2023-09-06: 75 mL via INTRAVENOUS

## 2023-09-06 MED ORDER — SODIUM CHLORIDE 0.9 % IV BOLUS
500.0000 mL | Freq: Once | INTRAVENOUS | Status: AC
  Administered 2023-09-06: 500 mL via INTRAVENOUS

## 2023-09-06 MED ORDER — LORAZEPAM 2 MG/ML IJ SOLN
INTRAMUSCULAR | Status: AC
  Administered 2023-09-06: 1 mg via INTRAVENOUS
  Filled 2023-09-06: qty 1

## 2023-09-06 MED ORDER — ACETAMINOPHEN 325 MG PO TABS
650.0000 mg | ORAL_TABLET | Freq: Once | ORAL | Status: AC
  Administered 2023-09-06: 650 mg via ORAL
  Filled 2023-09-06: qty 2

## 2023-09-06 NOTE — ED Notes (Signed)
 Contacted [facility]

## 2023-09-06 NOTE — Therapy (Signed)
 OUTPATIENT PHYSICAL THERAPY TREATMENT   Patient Name: [name] MRN: [id number] DOB:06/16/1940, 83 y.o., female Today's Date: 09/06/2023   PCP: Verdia Lombard, MD REFERRING PROVIDER: Onita Duos, MD  END OF SESSION:  PT End of Session - 09/06/23 0805     Visit Number 2    Number of Visits 16    Date for PT Re-Evaluation 10/27/23    Authorization Type [employer]    Progress Note Due on Visit 10    PT Start Time 0802    Activity Tolerance Patient tolerated treatment well           Past Medical History:  Diagnosis Date   Bipolar disorder (HCC)    CHF (congestive heart failure) (HCC)    Depression    Bipolar   Dysrhythmia    GERD (gastroesophageal reflux disease)    History of hiatal hernia    History of kidney stones    History of stomach ulcers 1970s   bleeding   Hyperlipidemia    Hypertension    Hypothyroidism    Ischemic colitis, enteritis, or enterocolitis (HCC)    OSA on CPAP    Pericarditis 05/06/2017   Pneumonia    several times (06/23/2017)   Thyroid  disease    TIA (transient ischemic attack) 2011 X 2   Type II diabetes mellitus (HCC)    Urinary bladder incontinence    Vertigo    Past Surgical History:  Procedure Laterality Date   APPENDECTOMY     BOTOX  INJECTION N/A 11/23/2022   Procedure: CYSTOBOTOX INJECTION;  Surgeon: Gaston Hamilton, MD;  Location: [facility];  Service: Urology;  Laterality: N/A;   BREAST CYST ASPIRATION Bilateral    CARDIAC CATHETERIZATION N/A 12/24/2014   Procedure: Right/Left Heart Cath and Coronary Angiography;  Surgeon: Jerel Balding, MD;  Location: [facility];  Service: Cardiovascular;  Laterality: N/A;   CATARACT EXTRACTION W/ INTRAOCULAR LENS  IMPLANT, BILATERAL Bilateral    EXCISIONAL HEMORRHOIDECTOMY     EYE SURGERY     FRACTURE SURGERY     IR THORACENTESIS ASP PLEURAL SPACE W/IMG GUIDE  05/13/2017   LAPAROSCOPIC CHOLECYSTECTOMY     LITHOTRIPSY  several times   RETINAL DETACHMENT  SURGERY     think it was on the left; not sure (06/23/2017)   TONSILLECTOMY     VAGINAL HYSTERECTOMY     partial    Patient Active Problem List   Diagnosis Date Noted   Tremor 07/05/2023   Memory loss 07/05/2023   Acquired hypothyroidism 07/04/2023   Bipolar 1 disorder (HCC) 07/04/2023   Chronic diastolic congestive heart failure (HCC) 07/04/2023   Chronic kidney disease, stage 3a (HCC) 07/04/2023   Current mild episode of major depressive disorder without prior episode (HCC) 07/04/2023   Encounter for general adult medical examination without abnormal findings 07/04/2023   Gastroesophageal reflux disease with esophagitis 07/04/2023   Hyperglycemia due to type 2 diabetes mellitus (HCC) 07/04/2023   Hypertensive heart and chronic kidney disease with heart failure and stage 1 through stage 4 chronic kidney disease, or unspecified chronic kidney disease (HCC) 07/04/2023   Hypocalcemia 07/04/2023   Long term current use of bisphosphonates 07/04/2023   Mixed incontinence 07/04/2023   Gait abnormality 07/04/2023   Vitamin D  deficiency 07/04/2023   CAP (community acquired pneumonia) 08/21/2020   Sepsis (HCC) 08/21/2020   Acute metabolic encephalopathy 08/21/2020   Elevated troponin 08/21/2020   Abdominal pain 08/21/2020   Acute lower UTI 06/22/2017   AKI (acute kidney injury) (HCC) 06/22/2017  Syncope 06/22/2017   Staring episodes 05/21/2017   Hyponatremia 05/21/2017   Bilateral pleural effusion    Pleural effusion on left    Cough    History of pericarditis 05/06/2017   Pericardial effusion 05/04/2017   OSA (obstructive sleep apnea) 02/18/2017   Dyspnea 01/27/2015   Atypical chest pain 12/24/2014   Accelerating angina (HCC) 12/23/2014   Chest pain at rest 10/30/2013   Exertional dyspnea 10/30/2013   Hyperlipidemia 10/30/2013   Morbid (severe) obesity due to excess calories (HCC) 10/30/2013   Acquired autoimmune hypothyroidism 09/02/2013   Type 2 diabetes mellitus without  complications (HCC) 06/14/2013   Hypertension with renal disease 06/14/2013   Mixed hyperlipidemia 06/14/2013   Atopic dermatitis 12/10/2011   Pruritus 12/10/2011    ONSET DATE: Past few years  REFERRING DIAG:  R25.1 (ICD-10-CM) - Tremor  R26.9 (ICD-10-CM) - Gait abnormality  R41.3 (ICD-10-CM) - Memory loss    THERAPY DIAG:  Other abnormalities of gait and mobility  Muscle weakness (generalized)  Unsteadiness on feet  Rationale for Evaluation and Treatment: Rehabilitation  SUBJECTIVE:                                                                                                                                                                                             SUBJECTIVE STATEMENT: Pt states she forgot her hearing aid this morning. Reports no new complaints this morning.   From eval: Pt states she is unable to bring up words while talking. Pt had brain scan and saw white matter changes. Reports this is her main concern but she does stumble when she walks. Pt states she has a tremor.  Pt accompanied by: self  PERTINENT HISTORY:   DM, CHF, A-fib, hypothyroidism, long history of antipsychotic medications due to bipolar disorder  PAIN:  Are you having pain? No  PRECAUTIONS: None  WEIGHT BEARING RESTRICTIONS: No  FALLS: Has patient fallen in last 6 months? No falls but stumbles  LIVING ENVIRONMENT: Lives with: lives alone Lives in: House/apartment Stairs: None Has following equipment at home: Single point cane, Environmental consultant - 2 wheeled, Wheelchair (manual), and shower chair  PLOF: Independent; likes to watch TV, goes out to lunch  PATIENT GOALS: Decrease stumbling, improve word finding  OBJECTIVE:  Note: Objective measures were completed at Evaluation unless otherwise noted.  TREATMENT DATE:  09/06/23 Activity Comment  Nustep L4-5 x 2 min   Stopped due to pt reporting her chest hurting  Vitals after Nustep 95 BPM, 95% SpO2 165/92 BP; RPE rated at 10/10 Pt states her HR is typically in the 90s  Self care Taking meds prior to PT, eating a little prior to exercise, checking BP and HR at home  Vitals after pt laying down 94 BPM, 90% SpO2 168/82 BP     PATIENT EDUCATION: Education details: taking meds, eating prior to PT Person educated: Patient Education method: Medical illustrator Education comprehension: verbalized understanding, returned demonstration, and needs further education  HOME EXERCISE PROGRAM: To be initiated  DIAGNOSTIC FINDINGS: Brain MRI 07/13/23 IMPRESSION: 1. No acute intracranial abnormality. 2. Progression of white matter changes of chronic small vessel ischemia.  Brain DaTSCAN  07/26/23 IMPRESSION: Ioflupane scan within normal limits. No reduced radiotracer activity in basal ganglia to suggest Parkinson's syndrome pathology.   Of note, DaTSCAN  is not diagnostic of Parkinsonian syndromes, which remains a clinical diagnosis. DaTscan  is an adjuvant test to aid in the clinical diagnosis of Parkinsonian syndromes.  COGNITION: Overall cognitive status: Reports word finding difficulties   SENSATION: Neuropathy just in feet and hands -- taking gabapentin   COORDINATION: Essential tremors noted L UE worse than R UE  EDEMA:  Reports bilat LE edema to her knees -- wears stockings  POSTURE: forward head and increased thoracic kyphosis  LOWER EXTREMITY ROM:     Active  Right Eval Left Eval  Hip flexion    Hip extension    Hip abduction    Hip adduction    Hip internal rotation    Hip external rotation    Knee flexion    Knee extension    Ankle dorsiflexion    Ankle plantarflexion    Ankle inversion    Ankle eversion     (Blank rows = not tested)  LOWER EXTREMITY MMT:    MMT Right Eval Left Eval  Hip flexion 4 4  Hip extension Holding bridge 34 sec  Hip abduction 3+ 3   Hip adduction    Hip internal rotation    Hip external rotation    Knee flexion 4 4-  Knee extension 4+ 4  Ankle dorsiflexion 3+ 3+  Ankle plantarflexion    Ankle inversion    Ankle eversion    (Blank rows = not tested)  GAIT: Findings: Gait Characteristics: step through pattern, decreased arm swing- Right, decreased arm swing- Left, decreased step length- Right, decreased step length- Left, decreased stride length, Right foot flat, Left foot flat, lateral lean- Right, lateral lean- Left, wide BOS, abducted- Right, and abducted- Left, Distance walked: Into clinic, Level of assistance: SBA, and Comments: UEs in high guard position.  Gait speed: 0.52 m/s  FUNCTIONAL TESTS:  5 times sit to stand: 18 sec Timed up and go (TUG): 18.4 sec Berg Balance Scale:  Item Test date: 09/01/23 Test date:  Test date:   Sitting to standing 4. able to stand without using hands and stabilize independently Insert OPRCBERGREEVAL SmartPhrase at re-test date Insert OPRCBERGREEVAL SmartPhrase at re-test date  2. Standing unsupported 3. able to stand 2 minutes with supervision Limited by tremors increasing as time increased    3. Sitting with back unsupported, feet supported 4. able to sit safely and securely for 2 minutes    4. Standing to sitting 4. sits safely with minimal use of hands    5. Pivot transfer  3. able to transfer safely  with definite need of hands    6. Standing unsupported with eyes closed 3. able to stand 10 seconds with supervision    7. Standing unsupported with feet together 2. able to place feet together independently but unable to hold for 30 seconds    8. Reaching forward with outstretched arms while standing 3. can reach forward 12 cm (5 inches)    9. Pick up object from the floor from standing 0. unable to try/needs assistance to keep from losing balance or falling    10. Turning to look behind over left and right shoulders while standing 4. looks behind from both sides and weight  shifts well    11. Turn 360 degrees 2. able to turn 360 degrees safely but slowly 7 sec    12. Place alternate foot on step or stool while standing unsupported 2. able to complete 4 steps without aid with supervision    13. Standing unsupported one foot in front 1. needs help to step but can hold 15 seconds    14. Standing on one leg 1. tries to lift leg unable to hold 3 seconds but remains standing independently.      Total Score 36/56 Total Score TBA/56 Total Score TBA/56     PATIENT SURVEYS:  Did not assess    GOALS: Goals reviewed with patient? Yes  SHORT TERM GOALS: Target date: 09/29/2023   Pt will be ind with initial HEP Baseline: Goal status: INITIAL  2.  Pt will have improved 5 x STS to </=15 sec Baseline: 18 Goal status: INITIAL  3.  Pt will be able to maintain static standing on compliant surface x 2 min independently for improved stability Baseline: Challenged to maintain 2 min on firm surface due to tremors increasing Goal status: INITIAL    LONG TERM GOALS: Target date: 10/27/2023   Pt will be ind with management and progression of HEP Baseline:  Goal status: INITIAL  2.  Pt will demo improved 5x STS to </=13 sec for increased functional LE strength Baseline: 18 sec Goal status: INITIAL  3.  Pt will have improved Berg Balance Score to >/=45 for reduced fall risk Baseline: 36 Goal status: INITIAL  4.  Pt will have improved TUG to </=15 sec to demo reduced fall risk Baseline: 18 sec Goal status: INITIAL    ASSESSMENT:  CLINICAL IMPRESSION: Ms. Dayton comes in today for her first full PT treatment. Attempted to warm pt up on the nustep and to work on improving endurance; however within 2 minutes of using machine pt became highly dyspneic and reported her chest hurting. Terminated exercise and checked vitals (see above) with some difficulty obtaining BP due to pt's tremors. Pt reported she did not eat anything prior to PT this morning (pt has history  of diabetes) and she has not taken any of her normal medication (normally takes them at 8am). Laid patient down but no easing of symptoms. Called 911. Called pt's son, Krystal and informed him of pt's status and that EMS was taking her to [facility].   From eval: Patient is a 83 y.o. F who was seen today for physical therapy evaluation and treatment for gait instability. PMH is significant for bipolar managed with multiple antipsychotics and DM with neuropathy. Pt demos an essential tremor that increases with muscle fatigue/prolonged standing. Pt is not very active at baseline but is independent with all of her household tasks; however, assessment finds that pt is at a high fall risk based on  her Berg Balance, TUG and 5x STS. Pt is grossly weak with limited endurance in bilat LEs. She will highly benefit from PT to improve on these deficits to increase her safety at home and in the community. Pt is noting issues with word finding and may benefit from SLP assessment.   OBJECTIVE IMPAIRMENTS: Abnormal gait, decreased activity tolerance, decreased balance, decreased coordination, decreased endurance, decreased mobility, difficulty walking, decreased strength, impaired UE functional use, improper body mechanics, and postural dysfunction.   ACTIVITY LIMITATIONS: carrying, lifting, bending, standing, squatting, stairs, transfers, and locomotion level  PARTICIPATION LIMITATIONS: meal prep, cleaning, laundry, shopping, and community activity  PERSONAL FACTORS: Age, Fitness, Past/current experiences, and Time since onset of injury/illness/exacerbation are also affecting patient's functional outcome.   REHAB POTENTIAL: Good  CLINICAL DECISION MAKING: Evolving/moderate complexity  EVALUATION COMPLEXITY: Moderate  PLAN:  PT FREQUENCY: 2x/week  PT DURATION: 8 weeks  PLANNED INTERVENTIONS: [id number]- PT Re-evaluation, 97750- Physical Performance Testing, 97110-Therapeutic exercises, 97530- Therapeutic  activity, [id number]- Neuromuscular re-education, 97535- Self Care, [id number]- Manual therapy, [phone]- Gait training, [phone]- Aquatic Therapy, Patient/Family education, Balance training, Stair training, Taping, Joint mobilization, Spinal mobilization, Cryotherapy, and Moist heat  PLAN FOR NEXT SESSION: Initiate HEP. Work on stabilizing exercise (consider isometrics and long holds). Core/LE strengthening. Basic balance. Did Dr. Onita put in an SLP referral?    Meagon Duskin April Ma L Emanuell Morina, PT, DPT 09/06/2023, 8:08 AM  Referring diagnosis?  R25.1 (ICD-10-CM) - Tremor  R26.9 (ICD-10-CM) - Gait abnormality  R41.3 (ICD-10-CM) - Memory loss   Treatment diagnosis? (if different than referring diagnosis) Other abnormalities of gait and mobility [R26.89]  Muscle weakness (generalized) [M62.81]  What was this (referring dx) caused by? []  Surgery []  Fall [x]  Ongoing issue []  Arthritis []  Other: ____________  Laterality: []  Rt []  Lt [x]  Both  Check all possible CPT codes:  *CHOOSE 10 OR LESS*    See Planned Interventions listed in the Plan section of the Evaluation.

## 2023-09-06 NOTE — ED Provider Notes (Signed)
 [place] EMERGENC[facility] DEPARTMENT AT [facility] Provider Note   CSN: [id number] Arrival date & time: 09/06/23  9074     Patient presents with: Chest Pain   [name] is a 83 y.o. female.   HPI Patient presents chest pain via EMS.  History is obtained by this individual and the patient herself. She was on an exercise bicycle, rehabbing, when after about 2 minutes she developed chest pain. Pain improved with aspirin , rest, but has recurred. EMS reports patient was otherwise hemodynamically stable in transport.     Prior to Admission medications   Medication Sig Start Date End Date Taking? Authorizing Provider  apixaban  (ELIQUIS ) 5 MG TABS tablet Take 1 tablet (5 mg total) by mouth 2 (two) times daily. 03/17/23   Croitoru, Mihai, MD  carbamazepine  (CARBATROL ) 200 MG 12 hr capsule Take 200 mg by mouth 2 (two) times daily.     [provider]  Cholecalciferol  (QC VITAMIN D3) 50 MCG (2000 UT) TABS Take 2,000 Units by mouth daily.    [provider]  clobetasol (TEMOVATE) 0.05 % GEL Apply 1 application topically 2 (two) times daily as needed (rash).     [provider]  diltiazem  (CARDIZEM  CD) 240 MG 24 hr capsule TAKE 1 CAPSULE BY MOUTH DAILY 06/10/23   Croitoru, Mihai, MD  donepezil  (ARICEPT ) 10 MG tablet Take 10 mg by mouth at bedtime. 01/22/19   [provider]  esomeprazole  (NEXIUM ) 40 MG capsule Take 1 capsule (40 mg total) by mouth daily at 12 noon. 06/27/17   Jude Harden GAILS, MD  famotidine  (PEPCID ) 20 MG tablet Take 1 tablet (20 mg total) by mouth 2 (two) times daily. 01/27/21   Croitoru, Mihai, MD  FARXIGA  10 MG TABS tablet TAKE 1 TABLET BY MOUTH DAILY 06/10/23   Thapa, [place], MD  folic acid  (FOLVITE ) 1 MG tablet Take 1 mg by mouth daily.    [provider]  furosemide  (LASIX ) 40 MG tablet TAKE 1 TABLET BY MOUTH EVERY DAY 08/19/21   Croitoru, Mihai, MD  gabapentin  (NEURONTIN ) 300 MG capsule Take 300 mg by mouth 4 (four) times  daily. Patient takes 1 capsule in the am, 1 capsule in the afternoon and two capsules in the evening    [provider]  HYDROcodone -acetaminophen  (NORCO/VICODIN) 5-325 MG tablet Take 1 tablet by mouth every 6 (six) hours as needed. 09/24/22   [provider]  insulin  aspart (NOVOLOG ) 100 UNIT/ML injection Use up to 40 units / day via CeQur patch 06/30/23   Thapa, [place], MD  insulin  glargine (LANTUS  SOLOSTAR) 100 UNIT/ML Solostar Pen Inject 25 Units into the skin daily. 06/23/23   Thapa, [place], MD  levothyroxine  (SYNTHROID , LEVOTHROID) 75 MCG tablet Take 75 mcg by mouth daily before breakfast.    [provider]  montelukast  (SINGULAIR ) 5 MG chewable tablet Chew 5 mg by mouth at bedtime.    [provider]  OLANZapine  (ZYPREXA ) 20 MG tablet Take 20 mg by mouth at bedtime. 08/24/23   [provider]  olmesartan  (BENICAR ) 20 MG tablet Take 20 mg by mouth daily. 10/21/22   [provider]  ondansetron  (ZOFRAN ) 4 MG tablet Take 4 mg by mouth every 8 (eight) hours as needed for nausea or vomiting.    [provider]  pravastatin  (PRAVACHOL ) 20 MG tablet Take 20 mg by mouth daily. 07/14/20   [provider]  propranolol  (INDERAL ) 10 MG tablet Take 1 tablet (10 mg total) by mouth 3 (three) times  daily. 08/15/23   Onita Duos, MD  QUEtiapine  (SEROQUEL ) 100 MG tablet Take 100 mg by mouth at bedtime. 12/29/18   [provider]  rosuvastatin (CRESTOR) 20 MG tablet Take 20 mg by mouth daily. 10/20/22   [provider]  spironolactone  (ALDACTONE ) 25 MG tablet TAKE 1 TABLET BY MOUTH EVERY DAY 10/19/21   Croitoru, Mihai, MD  tirzepatide  (MOUNJARO ) 12.5 MG/0.5ML Pen Inject 12.5 mg into the skin once a week. 06/23/23   Thapa, [place], MD  triamcinolone cream (KENALOG) 0.1 % Apply 1 Application topically as needed.    [provider]  Vibegron 75 MG TABS Take 75 mg by mouth daily in the afternoon.    [provider]     Allergies: Flagyl [metronidazole hcl], Ciprofloxacin, Metformin  and related, Methimazole, Milk-related compounds, and Other    Review of Systems  Updated Vital Signs BP (!) 149/79   Pulse 93   Temp 97.8 F (36.6 C) (Oral)   Resp 14   Ht 1.6 m (5' 3)   Wt 101.6 kg   SpO2 96%   BMI 39.68 kg/m   Physical Exam Vitals and nursing note reviewed.  Constitutional:      General: She is not in acute distress.    Appearance: She is well-developed. She is obese.  HENT:     Head: Normocephalic and atraumatic.  Eyes:     Conjunctiva/sclera: Conjunctivae normal.  Cardiovascular:     Rate and Rhythm: Normal rate and regular rhythm.  Pulmonary:     Effort: Pulmonary effort is normal. No respiratory distress.     Breath sounds: Normal breath sounds. No stridor.  Abdominal:     General: There is no distension.  Skin:    General: Skin is warm and dry.  Neurological:     Mental Status: She is alert and oriented to person, place, and time.     Cranial Nerves: No cranial nerve deficit.  Psychiatric:        Mood and Affect: Mood normal.     (all labs ordered are listed, but only abnormal results are displayed) Labs Reviewed  COMPREHENSIVE METABOLIC PANEL WITH GFR - Abnormal; Notable for the following components:      Result Value   Glucose, Bld 326 (*)    BUN 28 (*)    Creatinine, Ser 1.22 (*)    GFR, Estimated 44 (*)    All other components within normal limits  LIPASE, BLOOD - Abnormal; Notable for the following components:   Lipase 60 (*)    All other components within normal limits  TROPONIN I (HIGH SENSITIVITY) - Abnormal; Notable for the following components:   Troponin I (High Sensitivity) 19 (*)    All other components within normal limits  CBC  TROPONIN I (HIGH SENSITIVITY)    EKG: EKG Interpretation Date/Time:  Tuesday September 06 2023 09:32:56 EDT Ventricular Rate:  94 PR Interval:  198 QRS Duration:  85 QT Interval:  364 QTC Calculation: 456 R  Axis:   -32  Text Interpretation: Sinus rhythm Left axis deviation Nonspecific T abnormalities, lateral leads Confirmed by Garrick Charleston [id number]) on 09/06/2023 10:02:05 AM  Radiology: CT ABDOMEN PELVIS W CONTRAST Result Date: 09/06/2023 CLINICAL DATA:  Abdominal pain.  Concern for diverticulitis. EXAM: CT ABDOMEN AND PELVIS WITH CONTRAST TECHNIQUE: Multidetector CT imaging of the abdomen and pelvis was performed using the standard protocol following bolus administration of intravenous contrast. RADIATION DOSE REDUCTION: This exam was performed according to the departmental dose-optimization program which includes automated  exposure control, adjustment of the mA and/or kV according to patient size and/or use of iterative reconstruction technique. CONTRAST:  75mL OMNIPAQUE  IOHEXOL  350 MG/ML SOLN COMPARISON:  CT abdomen pelvis dated 08/21/2020. FINDINGS: Lower chest: Bibasilar linear atelectasis. There is calcification of the mitral annulus. No intra-abdominal free air or free fluid. Hepatobiliary: Fatty liver. No biliary dilatation. Cholecystectomy. Subcentimeter hypodense focus in the left lobe of the liver is too small to characterize, likely a cyst. Pancreas: Unremarkable. No pancreatic ductal dilatation or surrounding inflammatory changes. Spleen: Normal in size without focal abnormality. Adrenals/Urinary Tract: The adrenal glands are unremarkable. Mild bilateral renal parenchyma atrophy. There is no hydronephrosis on either side. There is symmetric enhancement and excretion of contrast by both kidneys. Subcentimeter exophytic hypodense lesion from the medial upper pole of the left kidney is too small to characterize. The visualized ureters and urinary bladder appear unremarkable. Stomach/Bowel: Small hiatal hernia. We there is scattered distal colonic diverticula without active inflammatory changes. There is no bowel obstruction. Appendectomy. Vascular/Lymphatic: Mild aortoiliac atherosclerotic disease. The  IVC is unremarkable. No portal venous gas. There is no adenopathy. Reproductive: Hysterectomy.  No suspicious adnexal masses. Other: None Musculoskeletal: Degenerative changes of the spine. No acute osseous pathology. IMPRESSION: 1. No acute intra-abdominal or pelvic pathology. 2. Colonic diverticulosis. No bowel obstruction. 3. Fatty liver. 4.  Aortic Atherosclerosis (ICD10-I70.0). Electronically Signed   By: Vanetta Chou M.D.   On: 09/06/2023 15:42   DG Chest 2 View Result Date: 09/06/2023 EXAM: 2 VIEW(S) XRAY OF THE CHEST 09/06/2023 10:08:00 AM COMPARISON: 08/22/2020 CLINICAL HISTORY: CP. Chest pain, sob starting this morning FINDINGS: LUNGS AND PLEURA: No focal pulmonary opacity. No pulmonary edema. No pleural effusion. No pneumothorax. HEART AND MEDIASTINUM: Mild cardiomegaly without congestive heart failure. Atherosclerosis in the transverse aorta. BONES AND SOFT TISSUES: No acute osseous abnormality. Lateral view degraded by patient arm position, not raised above the head. Multiple wires and leads project over the chest on the frontal radiograph. IMPRESSION: 1. No acute findings. 2. Mild cardiomegaly without congestive heart failure. Electronically signed by: Rockey Kilts MD 09/06/2023 10:19 AM EDT RP Workstation: [id number]     Procedures   Medications Ordered in the ED  sodium chloride  0.9 % bolus 500 mL (0 mLs Intravenous Stopped 09/06/23 1307)  acetaminophen  (TYLENOL ) tablet 650 mg (650 mg Oral Given 09/06/23 1556)  LORazepam  (ATIVAN ) injection 1 mg (1 mg Intravenous Given 09/06/23 1555)  iohexol  (OMNIPAQUE ) 350 MG/ML injection 75 mL (75 mLs Intravenous Contrast Given 09/06/23 1525)    Clinical Course as of 09/06/23 1602  Tue Sep 06, 2023  1531 Assumed care from Dr Garrick. Awaiting CT and cards consult. Has dementia. 83 yo F with cardiac disease had chest pain on a exercise bike. Troponin 13>19. Also having some abdominal pain and getting ct to ro diverticulitis.  [RP]  1539 Seen by  cardiology (Dr Pietro) who feels that it is not cardiac. Reports nothing else to do from their standpoint.  [RP]  1601 Ct unremarkable. Lipase minimally elevated. Given medds and will need to be reassessed at 5pm.  [RP]    Clinical Course User Index [RP] Yolande Lamar BROCKS, MD                                 Medical Decision Making Elderly female with multiple medical issues, on Eliquis , with diabetes presents with chest pain that started during exertion. Broad differential including pulmonary versus cardiac etiology, though  with exertional chest pain, ACS primary consideration. Patient started on monitoring, has already received aspirin  Cardiac 85 sinus normal pulse ox 100% room air normal  Amount and/or Complexity of Data Reviewed Independent Historian: EMS External Data Reviewed: notes. Labs: ordered. Decision-making details documented in ED Course. Radiology: ordered and independent interpretation performed. Decision-making details documented in ED Course. ECG/medicine tests: ordered and independent interpretation performed. Decision-making details documented in ED Course.  Risk Prescription drug management. Decision regarding hospitalization. Diagnosis or treatment significantly limited by social determinants of health.   4:00 PM With son at bedside I discussed findings thus far including mild lipase elevation, cardiology consult and recommendation for outpatient follow-up.  Patient has mild tremor, though this is baseline for her, and she has been evaluated for with Parkinson's with negative findings.  Patient much more comfortable, vital signs reassuring.  She continue to have mild epigastric discomfort, and after meds, fluids will be reassessed for disposition.  On repeat exam patient has mild tremor, which not unusual for her.  She is not accompanied by her son.  She notes that she has been evaluated for Parkinson's, that was negative. Final diagnoses:  Atypical chest pain      Garrick Charleston, MD 09/06/23 [phone]

## 2023-09-06 NOTE — Telephone Encounter (Signed)
 Called Mr. [name] in regards to his mother's status. Able to speak to him that 911 was called on her this morning and that she is to be taken to [facility]. He verbalizes agreement and states that he will call his brother and inform him.   [name] April Ma L Milon Dethloff, PT, DPT

## 2023-09-06 NOTE — Consult Note (Addendum)
 As below, patient seen and examined.  Briefly she is an 83 year old female with past medical history of chronic diastolic congestive heart failure, obstructive sleep apnea, diabetes mellitus, hyperlipidemia, question atrial flutter, pericarditis, prior TIA, hypertension for evaluation of chest pain.  Cardiac catheterization December 2016 showed normal coronary arteries.  Cardiac MRI April 2019 showed normal LV function, severely thickened pericardium with small amount of pericardial effusion and bilateral pleural effusions.  Last echocardiogram May 2022 showed normal LV function, moderate left ventricular hypertrophy, grade 1 diastolic dysfunction. Patient is a somewhat difficult historian.  However she states this morning she developed lower abdominal pain associated with nausea.  There was no vomiting, diarrhea, melena, fevers, chills or productive cough.  She also complained of worsening tremors.  She denies dyspnea.  She complained of substernal chest pain.  It did not radiate and there were no associated symptoms.  The pain has been persistent.  It is reproduced with palpation of her chest.  It is not positional or pleuritic. Laboratories show creatinine 1.22, lipase 60, troponin 13 and 19, hemoglobin 12.8.  Electrocardiogram shows sinus rhythm with lateral T wave inversion unchanged compared to April 13, 2023.  1 chest pain-symptoms have been persistent and troponin is 13 and 19 not diagnostic of acute coronary syndrome.  Electrocardiogram shows lateral T wave inversion which is unchanged compared to previous.  Her symptoms are reproduced with palpating her chest.  I think this is likely musculoskeletal and she does not require further cardiac evaluation.  I also do not think it is consistent with pericarditis.  2 abdominal pain-further evaluation per emergency room.  3 worsening tremors-further evaluation per emergency room.  4 chronic diastolic ingestive heart failure-she is euvolemic on  examination.  Continue Lasix  and Farxiga  at present dose.  5 hyperlipidemia-continue statin.  6 hypertension-blood pressure is mildly elevated.  Would continue preadmission medications and adjust based on follow-up readings.  7 question history of atrial flutter-per Dr. Francyne will continue apixaban  and Cardizem .  Presently in sinus rhythm.  Cardiology will sign off.  Further evaluation of noncardiac issues per emergency room. Redell Shallow, MD      Cardiology Consultation  Patient ID: [name] MRN: [id number]; DOB: [birth year]   Admission date: 09/06/2023  PCP:  Verdia Lombard, MD   [facility] Providers Cardiologist:  Jerel Francyne, MD     Chief Complaint:  chest pain  Patient Profile: [name] is a 83 y.o. female with chronic diastolic heart failure, hyperlipidemia, OSA, obesity, type 2 diabetes mellitus, bipolar disorder, hypothyroidism, history of possible atrial flutter, history of acute pericarditis with pericardial effusion in 2019, previous history of TIA and possible ischemic colitis, hypertension  who is being seen 09/06/2023 for the evaluation of chest pain.  History of Present Illness: [name] has past medical history as stated above. She presented to [facility] ED on 09/06/2023 complaining of chest pain, shortness of breath that started while exercising on a bicycle during rehab. She denies any complete resolution of her chest pain. She currently rates it 7/10, but states it ranges anywhere from 3/10 to 9/10. She is tender to palpation of her chest wall. She reports having abdominal pain.   Relevant workup while in the ED includes: troponin 13 ? 19, CBC normal, CMP showed hyperglycemia, elevated lipase at 60. CXR showed mild cardiomegaly without CHF, EKG showed sinus rhythm, HR 94, without acute ischemic changes.   She was last seen by Dr. Francyne 03/2023 for follow up. At this appointment she  reported doing well overall. She was  still having some issues with LE edema, even with compression stockings. She reported some weeping from her legs at this time. She was not experiencing any chest pain at this visit. At this appointment her medication regimen was as follows: Eliquis  4 mg BID, Farxiga  10 mg daily, diltiazem  240 mg daily, PO Lasix  40 mg daily, Benicar  20 mg daily, pravastatin  20 mg daily, spironolactone  25 mg daily. She was recently started on propranolol  10 mg PRN for essential tremor by neurology.   After speaking with the patient, she reports that she was doing her neurological rehab when she began feeling poorly. She said she was experiencing chest pain, shortness of breath, and increase in tremor from baseline. She tells me that she had abdominal pain this morning, which prompted her to come into the ED. Upon review of the PT note, from 09/01/2023, they documented that she tolerated treatment well and sustained 38 minutes of exercise. She has baseline tardive dyskinesia/essential tremor but reports that she has been having episodes of worsening shaking with the pain. She reports being able to function entirely independently. She can do all of her ADLs and drives herself. She rates her chest pain 7/10, she is tender to palpation on her chest wall, she reports increase in chest pain with deep inspiration.  She has a history of acute pericarditis, back in 2019.  When I asked her if this felt similar to that pain, she was unable to recall what that pain felt like.  She denies any recent known illnesses, sick contacts, changes in diet, medications, fluid intake.  She denies any orthopnea, she is currently laying flat in the bed without any difficulty breathing.  Relevant prior CV workup:  Echocardiogram, 2022: EF 70-75%, no RWMA, moderate LVH, G1DD, trivial pericardial effusion present   Long-term cardiac monitor, 2019: Mostly normal sinus rhythm, occasional PVCs, very brief runs of nonsustained atrial tachycardia, no A-fib or  ventricular arrhythmia.  Symptoms (dizziness, palpitations) correlated with episodes of mild sinus tachycardia  Lexiscan , 2019: normal perfusion, no ischemia, no scar, EF 70%, low risk  Cardiac MRI, 2019: normal LV, EF 66%, normal RV, normal atria, no significant valvular abnormalities, small pericardial effusion, severe circumferential acute pericarditis, bilateral pleural effusion.   RHC/LHC, 2016: normal coronary arteries, normal hemodynamics/filling pressures  Past Medical History:  Diagnosis Date   Bipolar disorder (HCC)    CHF (congestive heart failure) (HCC)    Depression    Bipolar   Dysrhythmia    GERD (gastroesophageal reflux disease)    History of hiatal hernia    History of kidney stones    History of stomach ulcers 1970s   bleeding   Hyperlipidemia    Hypertension    Hypothyroidism    Ischemic colitis, enteritis, or enterocolitis (HCC)    OSA on CPAP    Pericarditis 05/06/2017   Pneumonia    several times (06/23/2017)   Thyroid  disease    TIA (transient ischemic attack) 2011 X 2   Type II diabetes mellitus (HCC)    Urinary bladder incontinence    Vertigo    Past Surgical History:  Procedure Laterality Date   APPENDECTOMY     BOTOX  INJECTION N/A 11/23/2022   Procedure: CYSTOBOTOX INJECTION;  Surgeon: Gaston Hamilton, MD;  Location: [facility];  Service: Urology;  Laterality: N/A;   BREAST CYST ASPIRATION Bilateral    CARDIAC CATHETERIZATION N/A 12/24/2014   Procedure: Right/Left Heart Cath and Coronary Angiography;  Surgeon: Jerel Balding, MD;  Location: [facility];  Service: Cardiovascular;  Laterality: N/A;   CATARACT EXTRACTION W/ INTRAOCULAR LENS  IMPLANT, BILATERAL Bilateral    EXCISIONAL HEMORRHOIDECTOMY     EYE SURGERY     FRACTURE SURGERY     IR THORACENTESIS ASP PLEURAL SPACE W/IMG GUIDE  05/13/2017   LAPAROSCOPIC CHOLECYSTECTOMY     LITHOTRIPSY  several times   RETINAL DETACHMENT SURGERY     think it was on the left; not sure  (06/23/2017)   TONSILLECTOMY     VAGINAL HYSTERECTOMY     partial     Medications Prior to Admission: Prior to Admission medications   Medication Sig Start Date End Date Taking? Authorizing Provider  apixaban  (ELIQUIS ) 5 MG TABS tablet Take 1 tablet (5 mg total) by mouth 2 (two) times daily. 03/17/23   Croitoru, Mihai, MD  carbamazepine  (CARBATROL ) 200 MG 12 hr capsule Take 200 mg by mouth 2 (two) times daily.     [provider]  Cholecalciferol  (QC VITAMIN D3) 50 MCG (2000 UT) TABS Take 2,000 Units by mouth daily.    [provider]  clobetasol (TEMOVATE) 0.05 % GEL Apply 1 application topically 2 (two) times daily as needed (rash).     [provider]  diltiazem  (CARDIZEM  CD) 240 MG 24 hr capsule TAKE 1 CAPSULE BY MOUTH DAILY 06/10/23   Croitoru, Mihai, MD  donepezil  (ARICEPT ) 10 MG tablet Take 10 mg by mouth at bedtime. 01/22/19   [provider]  esomeprazole  (NEXIUM ) 40 MG capsule Take 1 capsule (40 mg total) by mouth daily at 12 noon. 06/27/17   Jude Harden GAILS, MD  famotidine  (PEPCID ) 20 MG tablet Take 1 tablet (20 mg total) by mouth 2 (two) times daily. 01/27/21   Croitoru, Mihai, MD  FARXIGA  10 MG TABS tablet TAKE 1 TABLET BY MOUTH DAILY 06/10/23   Thapa, [place], MD  folic acid  (FOLVITE ) 1 MG tablet Take 1 mg by mouth daily.    [provider]  furosemide  (LASIX ) 40 MG tablet TAKE 1 TABLET BY MOUTH EVERY DAY 08/19/21   Croitoru, Mihai, MD  gabapentin  (NEURONTIN ) 300 MG capsule Take 300 mg by mouth 4 (four) times daily. Patient takes 1 capsule in the am, 1 capsule in the afternoon and two capsules in the evening    [provider]  HYDROcodone -acetaminophen  (NORCO/VICODIN) 5-325 MG tablet Take 1 tablet by mouth every 6 (six) hours as needed. 09/24/22   [provider]  insulin  aspart (NOVOLOG ) 100 UNIT/ML injection Use up to 40 units / day via CeQur patch 06/30/23   Thapa, [place], MD  insulin  glargine (LANTUS  SOLOSTAR) 100 UNIT/ML Solostar  Pen Inject 25 Units into the skin daily. 06/23/23   Thapa, [place], MD  levothyroxine  (SYNTHROID , LEVOTHROID) 75 MCG tablet Take 75 mcg by mouth daily before breakfast.    [provider]  montelukast  (SINGULAIR ) 5 MG chewable tablet Chew 5 mg by mouth at bedtime.    [provider]  OLANZapine  (ZYPREXA ) 20 MG tablet Take 20 mg by mouth at bedtime. 08/24/23   [provider]  olmesartan  (BENICAR ) 20 MG tablet Take 20 mg by mouth daily. 10/21/22   [provider]  ondansetron  (ZOFRAN ) 4 MG tablet Take 4 mg by mouth every 8 (eight) hours as needed for nausea or vomiting.    [provider]  pravastatin  (PRAVACHOL ) 20 MG tablet Take 20 mg by mouth daily. 07/14/20   [provider]  propranolol  (INDERAL ) 10 MG tablet Take 1 tablet (  10 mg total) by mouth 3 (three) times daily. 08/15/23   Onita Duos, MD  QUEtiapine  (SEROQUEL ) 100 MG tablet Take 100 mg by mouth at bedtime. 12/29/18   [provider]  rosuvastatin (CRESTOR) 20 MG tablet Take 20 mg by mouth daily. 10/20/22   [provider]  spironolactone  (ALDACTONE ) 25 MG tablet TAKE 1 TABLET BY MOUTH EVERY DAY 10/19/21   Croitoru, Mihai, MD  tirzepatide  (MOUNJARO ) 12.5 MG/0.5ML Pen Inject 12.5 mg into the skin once a week. 06/23/23   Thapa, [place], MD  triamcinolone cream (KENALOG) 0.1 % Apply 1 Application topically as needed.    [provider]  Vibegron 75 MG TABS Take 75 mg by mouth daily in the afternoon.    [provider]    Allergies:    Allergies  Allergen Reactions   Flagyl [Metronidazole Hcl] Itching and Rash   Ciprofloxacin Itching and Rash   Metformin  And Related Rash   Methimazole Rash   Milk-Related Compounds Other (See Comments)    Stomach pains   Other Other (See Comments)    [place] nuts cause severe facial redness   Social History:   Social History   Socioeconomic History   Marital status: Widowed    Spouse name: Not on file   Number of children:  2   Years of education: 50   Highest education level: Not on file  Occupational History   Occupation: Retired  Tobacco Use   Smoking status: Never   Smokeless tobacco: Never  Vaping Use   Vaping status: Never Used  Substance and Sexual Activity   Alcohol use: Never   Drug use: Never   Sexual activity: Not on file  Other Topics Concern   Not on file  Social History Narrative   Lives at home alone.   Right-handed.   No caffeine use.   Social Drivers of Corporate investment banker Strain: Not on file  Food Insecurity: Not on file  Transportation Needs: Not on file  Physical Activity: Not on file  Stress: Not on file  Social Connections: Not on file  Intimate Partner Violence: Not on file    Family History:   The patient's family history includes Cancer in her brother and sister; Heart disease in her mother; Parkinson's disease in her father; Stroke in her father.    ROS:  Please see the history of present illness.  All other ROS reviewed and negative.     Physical Exam/Data: Vitals:   09/06/23 1035 09/06/23 1040 09/06/23 1050 09/06/23 1445  BP:   (!) 169/93 (!) 149/79  Pulse: 89 90 91 93  Resp: 16 14 17 14   Temp:      TempSrc:      SpO2: 96% 97% 97% 96%  Weight:      Height:        Intake/Output Summary (Last 24 hours) at 09/06/2023 1547 Last data filed at 09/06/2023 1307 Gross per 24 hour  Intake 500 ml  Output --  Net 500 ml      09/06/2023    9:28 AM 08/15/2023   11:11 AM 06/23/2023    2:37 PM  Last 3 Weights  Weight (lbs) 224 lb 219 lb 6.4 oz 222 lb 9.6 oz  Weight (kg) 101.606 kg 99.519 kg 100.971 kg     Body mass index is 39.68 kg/m.   General:  in no acute distress, on room air HEENT: normal Neck: no JVD Vascular: No carotid bruits; Distal pulses 2+ bilaterally  Cardiac:  normal S1, S2; RRR; no murmur  Lungs:  clear to auscultation bilaterally, no wheezing, rhonchi or rales  Abd: soft, nontender, no hepatomegaly  Ext: bilateral nonpitting LE  edema, patient reports at baseline  Musculoskeletal:  No deformities, BUE and BLE strength normal and equal Skin: warm and dry  Neuro:  no focal abnormalities noted Psych:  Normal affect   EKG:  The ECG that was done 09/06/2023 was personally reviewed and demonstrates sinus rhythm, HR 94, no acute changes when compared to prior EKGs  Relevant CV Studies:  Echocardiogram, 05/20/2020 Left ventricular ejection fraction, by estimation, is 70 to 75% . The left ventricle has hyperdynamic function. The left ventricle has no regional wall motion abnormalities. There is moderate concentric left ventricular hypertrophy. Left ventricular diastolic parameters are consistent with Grade I diastolic dysfunction ( impaired relaxation) .  Right ventricular systolic function is normal. The right ventricular size is normal. Tricuspid regurgitation signal is inadequate for assessing PA pressure.  The mitral valve is degenerative. Trivial mitral valve regurgitation. No evidence of mitral stenosis.  The aortic valve is tricuspid. There is mild calcification of the aortic valve. Aortic valve regurgitation is not visualized. No aortic stenosis is present.  Long-term monitor, 07/04/2017 The background rhythm was normal sinus with normal circadian variation There are occasional PVCs and a very brief runs of nonsustained atrial tachycardia up to 4 beats There is no evidence of atrial fibrillation or significant ventricular arrhythmia Patient symptoms (dizziness, palpitations, etc.) tend to correlate with periods of mild sinus tachycardia   Mostly normal findings on event monitor.  Occasional brief episodes of paroxysmal atrial tachycardia are seen, but appear to be asymptomatic.   No evidence of atrial fibrillation.   No arrhythmic events that could be expected to cause near syncope.  Carotid duplex, 06/23/2017 Right Carotid: Velocities in the right ICA are consistent with a 1-39%  stenosis.  Left Carotid: Velocities  in the left ICA are consistent with a 1-39%  stenosis.  Vertebrals: Bilateral vertebral arteries demonstrate antegrade flow.   Cardiac MRI, 05/05/2017 Normal left ventricular size, with mild concentric hypertrophy and normal systolic function (LVEF =66). There are no regional wall motion abnormalities.There is no late gadolinium enhancement in the left ventricular myocardium. Normal right ventricular size, thickness and systolic function (LVEF =68%). There are no regional wall motion abnormalities. Normal left and right atrial size Normal size of the aortic root, ascending aorta and pulmonary artery. No significant valvular abnormalities. There is a small amount of pericardial effusion located predominantly lateral to the left ventricle with maximum thickness 9 mm. There is no chamber collapse, IVC is not dilated. Pericardium is severely thickened measuring up to 6 mm with significant diffuse late gadolinium enhancement consistent with severe circumferential acute pericarditis. There is no evidence of myocarditis.Significant epicardial fat adjacent to the right ventricle. There is bilateral pleural effusion more prominent on the left.  Lexiscan , 04/22/2017 Normal perfusion No ischemia or scar Nuclear stress EF: 70%. This is a low risk study.  RHC/LHC, 12/24/2014 IMPRESSIONS:  Normal coronary arteries. Normal hemodynamics/filling pressures.    RECOMMENDATION:  Evaluate for a non-cardiac cause of symptoms.  Laboratory Data: High Sensitivity Troponin:   Recent Labs  Lab 09/06/23 0949 09/06/23 1131  TROPONINIHS 13 19*      Chemistry Recent Labs  Lab 09/06/23 0949  NA 142  K 4.6  CL 104  CO2 24  GLUCOSE 326*  BUN 28*  CREATININE 1.22*  CALCIUM 9.1  GFRNONAA 44*  ANIONGAP 14  Recent Labs  Lab 09/06/23 0949  PROT 6.8  ALBUMIN 3.5  AST 24  ALT 38  ALKPHOS 83  BILITOT 0.4   Lipids No results for input(s): CHOL, TRIG, HDL, LABVLDL, LDLCALC, CHOLHDL in the  last 168 hours. Hematology Recent Labs  Lab 09/06/23 0949  WBC 7.1  RBC 4.65  HGB 12.8  HCT 40.4  MCV 86.9  MCH 27.5  MCHC 31.7  RDW 13.7  PLT 331   Thyroid  No results for input(s): TSH, FREET4 in the last 168 hours. BNPNo results for input(s): BNP, PROBNP in the last 168 hours.  DDimer No results for input(s): DDIMER in the last 168 hours.  Radiology/Studies:  CT ABDOMEN PELVIS W CONTRAST Result Date: 09/06/2023 CLINICAL DATA:  Abdominal pain.  Concern for diverticulitis. EXAM: CT ABDOMEN AND PELVIS WITH CONTRAST TECHNIQUE: Multidetector CT imaging of the abdomen and pelvis was performed using the standard protocol following bolus administration of intravenous contrast. RADIATION DOSE REDUCTION: This exam was performed according to the departmental dose-optimization program which includes automated exposure control, adjustment of the mA and/or kV according to patient size and/or use of iterative reconstruction technique. CONTRAST:  75mL OMNIPAQUE  IOHEXOL  350 MG/ML SOLN COMPARISON:  CT abdomen pelvis dated 08/21/2020. FINDINGS: Lower chest: Bibasilar linear atelectasis. There is calcification of the mitral annulus. No intra-abdominal free air or free fluid. Hepatobiliary: Fatty liver. No biliary dilatation. Cholecystectomy. Subcentimeter hypodense focus in the left lobe of the liver is too small to characterize, likely a cyst. Pancreas: Unremarkable. No pancreatic ductal dilatation or surrounding inflammatory changes. Spleen: Normal in size without focal abnormality. Adrenals/Urinary Tract: The adrenal glands are unremarkable. Mild bilateral renal parenchyma atrophy. There is no hydronephrosis on either side. There is symmetric enhancement and excretion of contrast by both kidneys. Subcentimeter exophytic hypodense lesion from the medial upper pole of the left kidney is too small to characterize. The visualized ureters and urinary bladder appear unremarkable. Stomach/Bowel: Small  hiatal hernia. We there is scattered distal colonic diverticula without active inflammatory changes. There is no bowel obstruction. Appendectomy. Vascular/Lymphatic: Mild aortoiliac atherosclerotic disease. The IVC is unremarkable. No portal venous gas. There is no adenopathy. Reproductive: Hysterectomy.  No suspicious adnexal masses. Other: None Musculoskeletal: Degenerative changes of the spine. No acute osseous pathology. IMPRESSION: 1. No acute intra-abdominal or pelvic pathology. 2. Colonic diverticulosis. No bowel obstruction. 3. Fatty liver. 4.  Aortic Atherosclerosis (ICD10-I70.0). Electronically Signed   By: Vanetta Chou M.D.   On: 09/06/2023 15:42   DG Chest 2 View Result Date: 09/06/2023 EXAM: 2 VIEW(S) XRAY OF THE CHEST 09/06/2023 10:08:00 AM COMPARISON: 08/22/2020 CLINICAL HISTORY: CP. Chest pain, sob starting this morning FINDINGS: LUNGS AND PLEURA: No focal pulmonary opacity. No pulmonary edema. No pleural effusion. No pneumothorax. HEART AND MEDIASTINUM: Mild cardiomegaly without congestive heart failure. Atherosclerosis in the transverse aorta. BONES AND SOFT TISSUES: No acute osseous abnormality. Lateral view degraded by patient arm position, not raised above the head. Multiple wires and leads project over the chest on the frontal radiograph. IMPRESSION: 1. No acute findings. 2. Mild cardiomegaly without congestive heart failure. Electronically signed by: Rockey Kilts MD 09/06/2023 10:19 AM EDT RP Workstation: [id number]   Assessment and Plan:  Atypical chest pain Reported episode of substernal chest pain that has been chronic Patient is poor historian  Chest wall is tender to palpation  Troponin level: 13 ? 19  EKG showed sinus rhythm, no acute ischemic changes when compared to prior EKG Patient reports pleuritic chest pain, worse with deep inspiration, tender  to palpitation  Does not appear to be cardiac in nature Will discuss with MD but will likely not require further cardiac  workup   Essential tremor  Patient has been following with neurology as an outpatient  Recently started on propranolol  PRN for tremor  Further workup and treatment per primary   Abdominal pain  Patient reported abdominal pain Mildly elevated lipase Pending CT abdomen/pelvis Further workup and treatment per primary   Chronic HFpEF Hypertension  Echo from 2022: EF 70-75%, moderate LVH, G1DD, normal RV function Home meds: Farxiga  10 mg daily, Lasix  40 mg daily, spironolactone  25 mg daily, diltiazem  240 mg daily  Creatinine 1.22 today  Appears euvolemic on exam  Continue home medications   Possible atrial flutter  History of palpitations  History of prior TIA Remote history of presumed atrial flutter Determined to potentially be baseline motion artifact Reported history of palpitations  Nothing significant on monitor  High stroke risk factor, tolerating Eliquis  well  Home meds: diltiazem  240 mg daily, Eliquis  5 mg BID  Hyperlipidemia  LDL 82 as of 12/2019  Home meds: pravastatin  20 mg daily   For questions or updates, please contact [facility] Please consult [email or web address] for contact info under     Signed, Waddell DELENA Donath, PA-C  09/06/2023 3:47 PM

## 2023-09-06 NOTE — Inpatient Diabetes Management (Signed)
 Inpatient Diabetes Program Recommendations  AACE/ADA: New Consensus Statement on Inpatient Glycemic Control  Target Ranges:  Prepandial:   less than 140 mg/dL      Peak postprandial:   less than 180 mg/dL (1-2 hours)      Critically ill patients:  140 - 180 mg/dL     Latest Reference Range & Units 09/06/23 09:49  CO2 22 - 32 mmol/L 24  Glucose 70 - 99 mg/dL 673 (H)  Anion gap 5 - 15  14   Review of Glycemic Control  Diabetes history: DM2 Outpatient Diabetes medications: Lantus  25 units daily, Farxiga  10 mg daily, Mounjaro  12.5 mg Qweek, CeQur patch pump with Novolog  insulin  (takes 10 units with meals) Current orders for Inpatient glycemic control: None; in ED  Inpatient Diabetes Program Recommendations:     Insulin : If admitted, please be sure patient removes CeQur patch pump (if still on), consider ordering insulin  glargine 10 units Q24H, CBGs AC&HS, and Novolog  0-9 units AC&HS.  NOTE: Patient is currently in ED with chest pain and shortness of breath. In reviewing chart, noted patient sees Dr. Mercie (Endocrinologist) and was last seen on 06/23/23. Per office note on 06/23/23 patient was asked to continue Lantus  25 units daily, increase Mounjaro  from 10 to 12.5 mg weekly, continue Farxiga  10 mg daily, decrease CeQur simplicity 7 to 5 squeezes / 14 to 10 units with meals 3 times a day, advised to take before eating.   Sent chat message to [place], [place] to ask if patient currently has on CeQur patch pump.   Addendum 09/06/23@14 :54-Spoke to patient and her son at bedside. Patient is not able to tell me exactly what she takes for DM. Asked if she had on her patch pump or CGM sensor and patient stated, No.  Asked if I could check and she allowed me to check. Patient has FreeStyle Libre 3 CGM sensor on her right upper arm and she had the CeQur patch pump on her right upper abdomen.  Patient stated, my brain is not working today.  Discussed that per her Endocrinology note she is taking Lantus  25  units, Farxiga  10 mg daily, Mounjaro  12.5 mg Qweek, and uses the CeQur patch pump with meals (10 units with meals). Patient stated, that sounds right; she notes she is usually good with taking all her medications as prescribed but she can not remember much about her medications at the moment.  Asked patient if we could remove her CeQur patch pump to ensure she is not taking any insulin  with it and she allowed me to take the insulin  pump patch off (removed and disposed of it in the trash).  Patient and son are aware that it is off and that if she is admitted, we will be giving her insulin  if needed.  Patient has no questions or concerns at this time. Sent chat message to RN and Dr. Garrick that patient has on FreeStyle Libre3 CGM sensor, patient's CeQur insulin  patch pump was removed, that glucose was 326 mg/dl this morning, and asked that CBG be checked now and Novolog  correction be ordered and given if needed.   Thanks, Earnie Gainer, RN, MSN, CDCES Diabetes Coordinator Inpatient Diabetes Program [phone] (Team Pager from 8am to 5pm)

## 2023-09-06 NOTE — ED Notes (Signed)
 Pt unsteady on feet, NT assisted pt to bedside commode

## 2023-09-06 NOTE — ED Triage Notes (Signed)
 C/O substernal CP and SHOB that started this morning, pt was on bike and started having CP, non radiating. EMS gave 324mg  of aspirin . Pt in rehab for gait problems. Axox4.

## 2023-09-06 NOTE — ED Provider Notes (Signed)
  Physical Exam  BP 138/81   Pulse 92   Temp 97.8 F (36.6 C) (Oral)   Resp 17   Ht 5' 3 (1.6 m)   Wt 101.6 kg   SpO2 96%   BMI 39.68 kg/m   Physical Exam  Procedures  Procedures  ED Course / MDM   Clinical Course as of 09/06/23 2058  Tue Sep 06, 2023  1531 Assumed care from Dr Garrick. Awaiting CT and cards consult. Has dementia. 83 yo F with cardiac disease had chest pain on a exercise bike at PT. Troponin 13>19. Also having some abdominal pain and getting ct to ro diverticulitis.  [RP]  1539 EKG with nonspecific ST changes.  Serial troponins went from 13-19.  We did consult cardiology and she was seen by cardiology (Dr Pietro) who feels that it is not cardiac. Reports nothing else to do from their standpoint.  [RP]  1601 Ct unremarkable. Lipase minimally elevated. Given meds and will need to be reassessed at 5pm.  [RP]  1725 Still complaining of suprapubic pain. Will obtain UA.  [RP]  1749 Patient had an episode where she was having a tremor.  Does have essential tremor.  Was very anxious and upset.  Was given some Ativan .  Will also give her her Inderal .  Did have retained awareness.  Son was able to record a video.  Was generalized shaking.  No bowel or bladder incontinence. [RP]  2053 Patient reassessed.  Still is having her resting tremor.  I talked to her and her son about what was happening and apparently she was at neuro rehab today and started having worsening tremors and decided to come in.  Does state that she has some abdominal pain.  Her CT scan was unremarkable.  Was complaining of some suprapubic pain and had a urinalysis that did not show any acute findings.  Denies any chest pain to me at this point in time but is saying that her neck hurts.  I suspect this is likely from her tremor.  She did have an episode which does not appear to be seizure-like activity.  Did offer admission and observation as well as neurology consult but the patient and her family are requesting  go home.  They have other family members that will be able to take care of her.  Did counsel her to cut out caffeine since she still drinks coffee and to take her propranolol  and to follow-up with her neurologist as an outpatient. [RP]    Clinical Course User Index [RP] Yolande Lamar BROCKS, MD   Medical Decision Making Amount and/or Complexity of Data Reviewed Labs: ordered. Radiology: ordered.  Risk Prescription drug management. Decision regarding hospitalization.      Yolande Lamar BROCKS, MD 09/06/23 [phone]

## 2023-09-06 NOTE — Discharge Instructions (Signed)
 You were seen for your tremor in the emergency department.   At home, please please try to stop drinking any caffeinated beverages. Take the propranolol  that was prescribed to you for your tremor.    Check your MyChart online for the results of any tests that had not resulted by the time you left the emergency department.   Follow-up with your primary doctor in 2-3 days regarding your visit.  Follow-up with your neurologist as soon as possible.  Return immediately to the emergency department if you experience any of the following: loss of consciousness, or any other concerning symptoms.    Thank you for visiting our Emergency Department. It was a pleasure taking care of you today.

## 2023-09-07 DIAGNOSIS — G252 Other specified forms of tremor: Secondary | ICD-10-CM | POA: Diagnosis not present

## 2023-09-07 DIAGNOSIS — R251 Tremor, unspecified: Secondary | ICD-10-CM | POA: Diagnosis not present

## 2023-09-07 DIAGNOSIS — G259 Extrapyramidal and movement disorder, unspecified: Secondary | ICD-10-CM | POA: Diagnosis not present

## 2023-09-07 DIAGNOSIS — I517 Cardiomegaly: Secondary | ICD-10-CM | POA: Diagnosis not present

## 2023-09-07 NOTE — ED Notes (Addendum)
 PA Anabel and RN ambulated pt at this time. Pt states she felt a light headed while ambulating and states she feels weak in the legs. Pt ambulated from room to bathroom with shuffling gait with assistance from PA and RN.

## 2023-09-07 NOTE — ED Notes (Signed)
 Pt resting on stretcher on cardiac and pulse ox monitor. Respirations even and unlabored. Bed locked and at lowest position. Call bell within reach.

## 2023-09-07 NOTE — ED Notes (Signed)
 Pt back in room connected to cardiac and pulse ox monitor. Respirations even and unlabored. Call bell within reach

## 2023-09-07 NOTE — Progress Notes (Signed)
 EEG Skin Assessment  Patient: [name], [id number], 83 y.o.  Electrodes were removed on 09/07/2023 and no skin breakdown was found.   If any spots of skin breakdown were found, please submit a different entry using .EEGBREAKDOWN with .EEGELECTRODE added for each additional electrode site  JESSICA MACKOWSKI 09/07/2023 3:13 PM

## 2023-09-07 NOTE — Consults (Signed)
 [facility]  Neurology Consultation Note   Requesting Attending: Viviane Morene Bihari, MD,Emergency Medicine Reason for Consultation: tremors, gait instability    History of Present Illness:   [name] is a 83 y.o. Female with PMHx  has a past medical history of Depression, Diabetes mellitus type 2, uncomplicated (CMS/HHS-HCC), GERD (gastroesophageal reflux disease), Hypertension, S/P cardiac cath, Sleep apnea, Stroke (CMS/HHS-HCC), Thyroid  disease, and Urinary incontinence, mixed. who presents with worsening tremors.  Yesterday morning, patient was at a physical therapy appointment when she started having worsening shaking involving her whole body.  She has a known history of tremors and follows with neurology as an outpatient for these.  At baseline she has bilateral arm and leg tremors, worse on the left compared to the right.  But she reports that what she experienced yesterday was much worse than her baseline tremor.  She had 5 episodes yesterday of 10 to 15 minutes of whole body shaking.  She did not lose consciousness during these episodes and was able to respond during them.  Her son has video recordings of 2 of these episodes.  In one of the videos, she is seen with a nonrhythmic, pinching like movement in both arms, and more pronounced tremor in her left leg.  The second video with similar movements, however less severe.  Yesterday, she was evaluated at [facility], where she had a cardiac workup.  Additionally, since yesterday she has had difficulty with walking due to to generalized weakness. She is feeling improved today and has ambulated to the bathroom with minimal assist.   She has seen neurology as an outpatient for her baseline tremors.  Most recent appointment was 08/15/2023.  After this appointment, she was started on a low-dose of propranolol  which she states has not improved her tremors.  Per chart review, high suspicion for these tremors is  underlying essential tremor with possible medication side effects.  She has had a thorough workup including DaTscan , MRI brain, and laboratory testing.  She does have a family history of similar tremor in her mother.  She has also been working with her outpatient psychiatrist over the last 6 months on weaning down her Zyprexa  to see if there is any improvement with her tremors.  Per chart review, she has had an EEG in 2019 with very frequent and sometimes continuous generalized rhythmic delta activity.  This EEG was obtained for staring spells.  At baseline, she is fully independent, lives alone, drives, and manages her own medications.  In the ED, vital signs were as follows: T 36.3 C (97.4 F), HR 69, BP 138/49, RR 18, and satting 95 % on RA. BMP as follows: Na 144, 139, K 4.5, HCO3 24, BUN 24*, Cr 1.1*, glucose 232*. CBC as follows: WBC 7.0, Hgb 13.3, plts 384.   She denies any numbness, changes in vision, changes in speech, difficulty swallowing, bowel or bladder changes, incoordination, recent illness, headache, dizziness, chest pain, shortness of breath, and dysuria  Review of Systems:   Negative except as per HPI.  Past Medical History:   Past Medical History:  Diagnosis Date  . Depression   . Diabetes mellitus type 2, uncomplicated (CMS/HHS-HCC)   . GERD (gastroesophageal reflux disease)   . Hypertension   . S/P cardiac cath    right and left per care everywhere  . Sleep apnea   . Stroke (CMS/HHS-HCC)   . Thyroid  disease   . Urinary incontinence, mixed     Past Surgical History:  Past Surgical History:  Procedure Laterality Date  . ABDOMINAL HYSTERECTOMY  1974   partial  . APPENDECTOMY  2014  . CHOLECYSTECTOMY  2014  . HEMORRHOIDECTOMY EXTERNAL  2012?  . REPAIR COMPLEX RETINAL DETACHMENT    . TONSILLECTOMY      Family History:   Family History  Problem Relation Name Age of Onset  . Parkinsonism Father    . Stroke Father    . Cancer Father         prostatt  and bladder  . Bipolar disorder Father    . Heart disease Mother    . Cancer Sister         double mastectomy  . Cancer Son    . Lymphoma Sister         non-hodgekins     Social History:   Social History   Socioeconomic History  . Marital status: Widowed  . Number of children: 2  . Years of education: 16  Occupational History  . Occupation: retired     Comment: Engineer, site  Tobacco Use  . Smoking status: Never  . Smokeless tobacco: Never  Substance and Sexual Activity  . Alcohol use: No  . Drug use: No  Social History Narrative   Retired Engineer, site with two sons.  Lives alone in [place], has multiple physicians whom she sees regularly.  Independent and high functioning.     Allergies:   Allergies  Allergen Reactions  . Flagyl  [Metronidazole Hcl] Itching and Rash  . Milk Abdominal Pain  . Ciprofloxacin Rash  . Metformin  Rash  . Metoprolol  Succinate Rash  . Metronidazole Rash  . Other Other (See Comments)    [place] nuts cause severe facial redness  . Peanut Rash    Per Pt--states she isn't allergic to peanuts, just ESTONIA NUTS     Home Medications:   Prior to Admission Medications  Prescriptions Last Dose Taking?  FUROsemide  (LASIX ) 40 MG tablet  No  Sig: Take by mouth once daily.    OLANZapine  (ZYPREXA ) 5 MG tablet  No  Sig: Take 5 mg by mouth nightly  VIMPAT 50 mg tablet  No  Sig: TAKE ONE TABLET BY MOUTH TWICE A DAY  aspirin  81 MG EC tablet  No  Sig: Take 81 mg by mouth once daily  betamethasone dipropionate (DIPROLENE) 0.05 % cream  No  Sig: Apply topically 2 (two) times daily.    blood glucose diagnostic (GLUCOSE BLOOD) test strip  No  Sig: Use as instructed to check blood sugar 5 times per day dx code E11.65  blood glucose meter (ONETOUCH ULTRA2) kit  No  Sig: Use to check blood sugar 5 times per day Dx code E11.65  canagliflozin  (INVOKANA  ORAL)  No  Sig: Take 300 mg by mouth once daily  carBAMazepine  (CARBATROL ) 200 MG CR capsule   No  Sig: Take 1 capsule (200 mg total) by mouth 2 (two) times daily.  cholecalciferol  (VITAMIN D3) 2,000 unit tablet  No  Sig: Take by mouth once daily.    clobetasol (TEMOVATE) 0.05 % topical gel  No  Sig: Apply topically 2 (two) times daily  diltiazem  (CARDIZEM  CD) 360 MG CD capsule  No  Sig: Take 360 mg by mouth nightly    esomeprazole  (NEXIUM ) 40 MG DR capsule  No  Sig: Take 40 mg by mouth once daily.    folic acid  (FOLVITE ) 1 MG tablet  No  Sig: Take by mouth once daily.    ibuprofen  (ADVIL ,MOTRIN ) 800  MG tablet  No  Sig: Take 800 mg by mouth every 6 (six) hours as needed for Pain  insulin  LISPRO (HUMALOG ) injection (concentration 100 units/mL)  No  Sig: Inject subcutaneously.  insulin  degludec (TRESIBA  U-100 INSULIN  SUBQ)  No  Sig: Inject subcutaneously 8-10 units daily (AM)  lancets (ONETOUCH DELICA LANCETS) 33 gauge Misc  No  Sig: Use to check blood sugar 5 times per day dx code E11.65  levothyroxine  (SYNTHROID , LEVOTHROID) 75 MCG tablet  No  Sig: Take 75 mcg by mouth every morning before breakfast    liraglutide  (VICTOZA ) 0.6 mg/0.1 mL (18 mg/3 mL) pen injector  No  Sig: Inject subcutaneously once daily. 12 mL sub-q once daily.  methotrexate (RHEUMATREX) 2.5 MG tablet  No  Sig: TAKE 4 TABLETS BY MOUTH ONCE A WEEK  metoprolol  succinate (TOPROL -XL) 25 MG XL tablet  No  Sig: Take 25 mg by mouth once daily  nitroGLYcerin  (NITROSTAT ) 0.4 MG SL tablet  No  Sig: Place under the tongue.  ondansetron  (ZOFRAN ) 8 MG tablet  No  Sig: Take 8 mg by mouth 2 (two) times daily as needed for Nausea.  pantoprazole  (PROTONIX ) 40 MG DR tablet  No  Sig: Take 40 mg by mouth once daily  pen needle, diabetic (BD ULTRA-FINE NANO PEN NEEDLES) 32 gauge x 5/32 Ndle  No  Sig: Use 8 pen needles per day  pravastatin  (PRAVACHOL ) 40 MG tablet  No  Sig: Take 40 mg by mouth once daily  sub-q insulin  device, 40 unit Devi  No  Sig: by Misc.(Non-Drug; Combo Route) route.    Facility-Administered  Medications: None    Current Hospital Medications:   Scheduled Meds: Continuous Infusions: PRN Meds:.  Physical Exam:   Vital signs in last 24 hours: Temp:  [36.3 C (97.3 F)-36.3 C (97.4 F)] 36.3 C (97.3 F) Heart Rate:  [64-69] 69 Resp:  [13-18] 13 BP: (123-138)/(49-99) 123/60 Temp (24hrs), Avg:36.3 C (97.3 F), Min:36.3 C (97.3 F), Max:36.3 C (97.4 F)  SpO2: 96 %    General: Well appearing. In no distress.  Neurologic Exam: Mental Status: Awake. Alert. Oriented to person, place, and time. Speech: No aphasia. No dysarthria. Naming and repetition intact. Cranial Nerves:    II,III: Pupils 3mm and reactive to light bilaterally. VFF by confrontation.    III,IV,VI: EOMI w/o nystagmus. No ptosis   V: Sensation intact to light touch   VII: Face symmetric without weakness   VIII: Hearing to voice intact   IX,X: Voice normal, palate elevates symmetrically   XI: SCM/trap full   XII: Tongue protrudes midline. No atrophy or fasciculation Motor: Normal bulk and tone. No arm drift. Has high amplitude high frequency tremors in both arms, left greater than right, present at rest and pronounced in wing and outstretched positions.  Amplitude of tremors changes, decreased amplitude with distraction.  Tremors also decreased with action such as on finger-to-nose testing.  Low amplitude high-frequency tremors in both legs, left greater than right.  More pronounced with movement of legs.   Strength: Dlt Bic Tri WE WF FgS Grp HF KF KE PF DF    Left 5 5 5 5 5 5 5 5 5 5 5 5     Right 5 5 5 5 5 5 5 5 5 5 5 5   Sensation: Intact to light touch, pin prick and vibration throughout. No extinction to DSS.  Coordination: Intact FTN and HTS bilaterally. Reflexes: 1+ throughout, symmetric. Toes down-going bilaterally. Gait: Cautious gait, short stride length  Data:  Pertinent Labs: Lab Results  Component Value Date   CREATININE 1.1 (H) 09/07/2023   BUN 24 (H) 09/07/2023   NA 144 09/07/2023    NA 139 09/07/2023   K 4.5 09/07/2023   CL 107 09/07/2023   CO2 24 09/07/2023   Lab Results  Component Value Date   WBC 7.0 09/07/2023   HGB 13.3 09/07/2023   HCT 41.0 09/07/2023   MCV 85 09/07/2023   PLT 384 09/07/2023   Lab Results  Component Value Date   ALT 42 (H) 09/07/2023   AST 41 09/07/2023   ALKPHOS 84 09/07/2023   Lab Results  Component Value Date   INR 1.1 09/07/2023   INR 1.0 11/17/2017    Assessment and Plan:   [name] is a 83 y.o. Female with PMHx DM2, bipolar disorder HTN, HLD, A-fib, and hypothyroidism who presents for evaluation of episodes of increased tremor that occurred yesterday.  Patient with baseline tremors in both arms and legs, for which she follows with neurology outpatient for.  Had 5 episodes yesterday of 5 to 10 minutes of worsened tremors.  Patient's son recorded 2 of these episodes.  On review, these episodes have nonrhythmic bilateral arm shaking and left leg shaking.  She is aware during these episodes and remembers these events from yesterday.  From reviewing these videos, they do not appear to be consistent with seizures.  However, given her history of abnormal EEG with generalized periodic discharges, recommended obtaining stat 20-minute EEG.  Stat EEG was able to capture an event that was similar to the episodes that occurred yesterday (confirmed by son).  This episode had no correlate seen on EEG.  She is ambulatory, however has somewhat of a cautious gait.  She is already seeing PT as an outpatient.  She is following with neurology for these tremors and also working with her longstanding psychiatrist on weaning down some of her antipsychotic medications to help reduce the tremor.  She has a strong network of outpatient providers who are working on and evaluating her tremors thoroughly.  Recommend continuing with outpatient PT and following up with her psychiatrist and neurologist outpatient as well.  No further recommendations.    CNS imaging personally reviewed.   The patient was staffed with Dr. Kennth who is in agreement with the above stated plan. Recommendations were conveyed to primary team.  Neurology will not continue to actively follow, but please page [phone] if any additional questions arise.    Mary Roux, MD Neurology PGY-2 09/07/2023    ------------------------------------------------------------------------------- Attestation signed by [place], [place] Lee, MD at 09/08/2023  7:54 AM  Attestation Statement:  Episodes of concern captured on rEEG without correlate, initially low suspicion for sz, now with objective data showing the arrhythmic  movements are indeed not sz. Seem more likely to be related to her ongoing outpatient diagnosis therefore agree with following up with outpatient neuro & psych for further adjustments of her medications.   I personally saw and evaluated the patient, and participated in the management and treatment plan as documented in the resident/fellow note.  SWAZILAND JAMA KENNTH, MD  -------------------------------------------------------------------------------

## 2023-09-07 NOTE — ED Notes (Signed)
 Pt resting on stretcher on cardiac and pulse ox monitor. Respirations even and unlabored. Call bell within reach. No further needs from RN at this time.

## 2023-09-07 NOTE — ED Provider Notes (Signed)
 7:10 AM  Assumed care of patient from off-going team. For more details, please see note from same day. HPI below from primary note:  Tremors, decreased ability to care for herself at home. H/o Alzheimers  ED Course/Additional Details:  Labs Reviewed  COMPREHENSIVE METABOLIC PANEL (CMP) - Abnormal; Notable for the following components:      Result Value   Urea Nitrogen (BUN) 24 (*)    Creatinine 1.2 (*)    Glucose 239 (*)    All other components within normal limits  COMPREHENSIVE METABOLIC PANEL (CMP) - Abnormal; Notable for the following components:   Urea Nitrogen (BUN) 24 (*)    Creatinine 1.1 (*)    Glucose 232 (*)    ALT (Alanine Aminotransferase) 42 (*)    Anion Gap 13 (*)    All other components within normal limits  MAGNESIUM  - Abnormal; Notable for the following components:   Magnesium  2.7 (*)    All other components within normal limits  SHOCK PANEL, VENOUS - Abnormal; Notable for the following components:   % Methemoglobin, Venous 0.1 (*)    Glucose, Nonfasting, Venous 240 (*)    All other components within normal limits  POC GLUCOSE - Abnormal; Notable for the following components:   POC Glucose 251 (*)    All other components within normal limits   Narrative:    POC TEST(S) ABOVE PERFORMED AT THE PATIENT CARE LOCATION AND OVERSEEN BY THE [facility] POCT PROGRAM.  COAGULATION SCREEN - Normal  CARBAMAZEPINE  LEVEL, TOTAL - Normal  COMPLETE BLOOD COUNT (CBC) WITH DIFFERENTIAL  TROPONIN I, HIGH SENSITIVITY ED EVALUATION  TROPONIN I, HIGH SENSITIVITY ONE HOUR (LO)  TROPONIN I, HIGH SENSITIVITY THREE HOUR (LO)   CT brain without contrast  ED Interpretation  No acute intracranial abnormalities.     Final Result       ED Course as of 09/08/23 0942  Wed Sep 07, 2023  0713 Troponins flat, CBC and Hgb WNL, CMP unremarkable [AB]  0714 CT brain without contrast IMPRESSION:  No cause of tremor is evident  [AB]  0758 Patient was ambulatory to the restroom with slow  shuffling gait.  Mild upper extremity tremors appreciated intermittently.  Patient denied any dizziness or gait instability.  Reported feeling somewhat lightheaded with ambulation. Given new tremors, gait instability and per family request, consulted neurology [AB]  1146 Neurology evaluated the patient, recommending stat 20 min EEG [AB]    ED Course User Index [AB] Cleve Corinda Massa, PA    Follow-up at [facility]:  Neuro consult  [ ]    ED Course as of 09/08/23 0942  Wed Sep 07, 2023  0713 Troponins flat, CBC and Hgb WNL, CMP unremarkable [AB]  0714 CT brain without contrast IMPRESSION:  No cause of tremor is evident  [AB]  0758 Patient was ambulatory to the restroom with slow shuffling gait.  Mild upper extremity tremors appreciated intermittently.  Patient denied any dizziness or gait instability.  Reported feeling somewhat lightheaded with ambulation. Given new tremors, gait instability and per family request, consulted neurology [AB]  1146 Neurology evaluated the patient, recommending stat 20 min EEG [AB]    ED Course User Index [AB] Cleve Corinda Massa, PA      Cleve Corinda [place], [place] 09/08/23 [id number]

## 2023-09-07 NOTE — ED Notes (Addendum)
 Pt given discharge instructions and verbalized understanding. PIV removed. All belongings taken home. Pt taken to lobby with RN via wheelchair at this time

## 2023-09-07 NOTE — ED Notes (Signed)
[]   neuro recs  Couldn't walk Confusion Blank stare Violent shaking   Dr. Helayne neurology consult.  They were able to capture an event on EEG without elliptic form activity.  Patient is stable for discharge home with her outpatient follow-up.   [name], [name] 09/07/23 [phone]

## 2023-09-07 NOTE — ED Notes (Addendum)
 Pt ambulated to bathroom with RN

## 2023-09-07 NOTE — Progress Notes (Signed)
 EEG Skin Assessment  Patient: [name], [id number], 83 y.o.  A full set of EEG electrodes were applied and there was no skin breakdown at hookup.   If any spots of skin breakdown were found, please submit a different entry using .EEGBREAKDOWN with .EEGELECTRODE added for each additional electrode site  EMILY  BROCK 09/07/2023 1:25 PM

## 2023-09-09 ENCOUNTER — Telehealth: Payer: Self-pay | Admitting: Neurology

## 2023-09-09 NOTE — Telephone Encounter (Signed)
 Pt called to request Hospital Follow up the patient states she was just realse from Hospital and they recommended Pt follow up with the earliest appt ,

## 2023-09-12 ENCOUNTER — Ambulatory Visit (INDEPENDENT_AMBULATORY_CARE_PROVIDER_SITE_OTHER): Admitting: Neurology

## 2023-09-12 ENCOUNTER — Telehealth: Payer: Self-pay | Admitting: Neurology

## 2023-09-12 ENCOUNTER — Encounter: Payer: Self-pay | Admitting: Neurology

## 2023-09-12 VITALS — BP 122/74 | HR 60 | Ht 63.0 in | Wt 212.0 lb

## 2023-09-12 DIAGNOSIS — R251 Tremor, unspecified: Secondary | ICD-10-CM

## 2023-09-12 DIAGNOSIS — R479 Unspecified speech disturbances: Secondary | ICD-10-CM | POA: Insufficient documentation

## 2023-09-12 DIAGNOSIS — R413 Other amnesia: Secondary | ICD-10-CM | POA: Diagnosis not present

## 2023-09-12 DIAGNOSIS — R269 Unspecified abnormalities of gait and mobility: Secondary | ICD-10-CM

## 2023-09-12 NOTE — Telephone Encounter (Signed)
 Pt bp at home was 110/64. Pt son doesn't think she needs hospital but needs Dr. Onita instead

## 2023-09-12 NOTE — Telephone Encounter (Signed)
 Returned call to pt who stated that she was on eliptical at pt and experienced shortness of breath, tremors (whole body), transient alteration of awareness. [facility] to [facility] and they dismissed her due to ruling out cardiac issues. Her son stated that the tremors were sz like and decided to take her to [facility]. [facility] ruled out sz due to normal eeg. Pt son stated that she needs followup with Dr. Onita. We have a 3:30 today that I can offer should Dr. Onita state that its ok.   Symptoms that are presenting are abdominal pain, upper chest pain, bilateral hands/arms/legs shaking. Fatigued, lethargic. Pt son stated that she cannot do rehab until gets everything under control. The son and pt wonder if long term medication use is causing these flare ups.

## 2023-09-12 NOTE — Telephone Encounter (Signed)
 Called and spoke to pt and was able to get them scheduled for today as requested by provider. Pt voiced gratitude and understanding

## 2023-09-12 NOTE — Telephone Encounter (Signed)
Ok to see her today

## 2023-09-12 NOTE — Progress Notes (Signed)
 Chief Complaint  Patient presents with   Hospitalization Follow-up    RM 14      ASSESSMENT AND PLAN  [name] is a 83 y.o. female   Tremor  mild difficulty drawing spiral circle, resting and posturing tremor, no significant rigidity bradykinesia  Long history of antipsychotic medications due to bipolar disorder, including Zyprexa , which has lower, but still potential for extrapyramidal symptoms,  DatScan  showed no evidence of dopamine deficiency,  Tremor most likely underlying essential tremor, medication side effect can contributed to  Psychiatrist is tapering down Zyprexa ,  Episodes of large amplitude body tremor, arm thrashing movement, EEG showed no seizure activity,  Suggestive of continue home physical therapy occupational therapy,  Return in 6 months  DIAGNOSTIC DATA (LABS, IMAGING, TESTING) - I reviewed patient records, labs, notes, testing and imaging myself where available.   MEDICAL HISTORY:  [name] is a 83 year old left handed female, seen in request by her primary care from [facility] Dr. Verdia, Lequita for evaluation of tremor, memory loss, initial evaluation July 05, 2023  History is obtained from the patient and review of electronic medical records. I personally reviewed pertinent available imaging films in PACS.   PMHx of  DM-insulin  dependent DM peripheral neuropathy HLD HTN Hypothyrodism Afib, CHF Bipolar disorder, polypharmacy, zyprexa  20mg  at bedtime, seroquel  100mg  at bedtime, Carbatrol  200mg  bid Stroke History of kidney stone  Patient is a retired high school Retail buyer, lives alone, drove to clinic today,  Since 2024, she began to develop this tremor, all over her body, gradually getting worse, almost constant, bothersome, but no limitation in her daily activity, I know I got it, I should not have it  She is sedentary, walk half mile few times each week, denies significant gait change, she  woke up frequently during her sleep using bathroom, has a long history of urinary urgency,  She had long history of bipolar disorder, on polypharmacy, including Zyprexa , Seroquel , Carbatrol , because of her tremor,  a trial with her psychiatrist of lower dose of Zyprexa  without any improvement  She has decreased smell for many years, denied REM sleep disorder, or excessive movement during sleep,  Her mother suffered tremor in her old age, similar to hers, but 5 other sisters do not have similar issues  Labs in June 2025, A1C 7.6, Na 133, creat 1.06, LDL 58,  UPDATE July 28th 2025: She is bothered by her intermittent tremor, but denied major limitation, mild difficulty drawing spiral circle,  MRI of the brain showed mild to moderate small vessel disease, already on Eliquis  due to history of A-fib  DaTSCAN  was negative for dopamine deficiency  Extensive laboratory evaluation were essentially normal, including ATN profile, Alzheimer's genetic testing, there was no treatable etiology identified, normal B12 TSH, RPR  UPDATE August 25th 2025: During her physical therapy depression at [facility] on August 19th 2025, she developed whole body tremor, shortness of breath, was evaluated at emergency room, cardiac workup was negative Laboratory evaluation showed no significant abnormality including CMP with exception of mild elevation of glucose 237, creatinine 1.2  Her son showed me a videotape of her whole body tremor, arm thrashing movement, after discharge, she was again taken to [facility] emergency room,  CT head showed no acute abnormality  Laboratory evaluation September 07, 2023 from [facility], CMP showed mild elevation of creatinine 1.2, glucose 239, negative troponin, magnesium  slight elevation 2.7, Tegretol  level 5.0,  EEG showed diffuse slowing, no epileptiform discharge, per family, she did  had a whole body shaking episode during recording  She has gained her strength back over the past few  days,she was able to drving, shopping again, but today, she felt weak, had a mild body shaking episode,  PHYSICAL EXAM:   Vitals:   09/12/23 1517  BP: 122/74  Pulse: 60  SpO2: 96%  Weight: 212 lb (96.2 kg)  Height: 5' 3 (1.6 m)     PHYSICAL EXAMNIATION:  Gen: NAD, conversant, well nourised, well groomed                     Cardiovascular: Regular rate rhythm, no peripheral edema, warm, nontender. Eyes: Conjunctivae clear without exudates or hemorrhage Neck: Supple, no carotid bruits. Pulmonary: Clear to auscultation bilaterally   NEUROLOGICAL EXAM:  MENTAL STATUS: Speech/cognition: Anxious looking elderly female, awake, alert, oriented to history taking and casual conversation    07/05/2023   11:00 AM  Montreal Cognitive Assessment   Visuospatial/ Executive (0/5) 5  Naming (0/3) 3  Attention: Read list of digits (0/2) 2  Attention: Read list of letters (0/1) 1  Attention: Serial 7 subtraction starting at 100 (0/3) 1  Language: Repeat phrase (0/2) 2  Language : Fluency (0/1) 0  Abstraction (0/2) 2  Delayed Recall (0/5) 2  Orientation (0/6) 5  Total 23    CRANIAL NERVES: CN II: Visual fields are full to confrontation. Pupils are round equal and briskly reactive to light. CN III, IV, VI: extraocular movement are normal. No ptosis. CN V: Facial sensation is intact to light touch CN VII: Face is symmetric with normal eye closure  CN VIII: Hearing is normal to causal conversation. CN IX, X: Phonation is normal. CN XI: Head turning and shoulder shrug are intact  MOTOR: Mild bilateral hands tremor, no significant rigidity, bradykinesia, moderate difficulty drawing spiral circle  REFLEXES: Reflexes are 1 and symmetric at the biceps, triceps, knees, and ankles. Plantar responses are flexor.  SENSORY: Intact to light touch, vibratory sensation at the ankle level  COORDINATION: There is no trunk or limb dysmetria noted.  GAIT/STANCE: Wheelchair, deferred  REVIEW OF  SYSTEMS:  Full 14 system review of systems performed and notable only for as above All other review of systems were negative.   ALLERGIES: Allergies  Allergen Reactions   Flagyl [Metronidazole Hcl] Itching and Rash   Ciprofloxacin Itching and Rash   Metformin  And Related Rash   Methimazole Rash   Milk-Related Compounds Other (See Comments)    Stomach pains   Other Other (See Comments)    [name] cause severe facial redness    HOME MEDICATIONS: Current Outpatient Medications  Medication Sig Dispense Refill   apixaban  (ELIQUIS ) 5 MG TABS tablet Take 1 tablet (5 mg total) by mouth 2 (two) times daily. 60 tablet 6   carbamazepine  (CARBATROL ) 200 MG 12 hr capsule Take 200 mg by mouth 2 (two) times daily.      Cholecalciferol  (QC VITAMIN D3) 50 MCG (2000 UT) TABS Take 2,000 Units by mouth daily.     clobetasol (TEMOVATE) 0.05 % GEL Apply 1 application topically 2 (two) times daily as needed (rash).      diltiazem  (CARDIZEM  CD) 240 MG 24 hr capsule TAKE 1 CAPSULE BY MOUTH DAILY 90 capsule 3   donepezil  (ARICEPT ) 10 MG tablet Take 10 mg by mouth at bedtime.     esomeprazole  (NEXIUM ) 40 MG capsule Take 1 capsule (40 mg total) by mouth daily at 12 noon. 30 capsule 2   famotidine  (  PEPCID ) 20 MG tablet Take 1 tablet (20 mg total) by mouth 2 (two) times daily. 60 tablet 1   FARXIGA  10 MG TABS tablet TAKE 1 TABLET BY MOUTH DAILY 90 tablet 2   folic acid  (FOLVITE ) 1 MG tablet Take 1 mg by mouth daily.     furosemide  (LASIX ) 40 MG tablet TAKE 1 TABLET BY MOUTH EVERY DAY 90 tablet 1   gabapentin  (NEURONTIN ) 300 MG capsule Take 300 mg by mouth 4 (four) times daily. Patient takes 1 capsule in the am, 1 capsule in the afternoon and two capsules in the evening     HYDROcodone -acetaminophen  (NORCO/VICODIN) 5-325 MG tablet Take 1 tablet by mouth every 6 (six) hours as needed.     insulin  aspart (NOVOLOG ) 100 UNIT/ML injection Use up to 40 units / day via CeQur patch 10 mL 11   insulin  glargine  (LANTUS  SOLOSTAR) 100 UNIT/ML Solostar Pen Inject 25 Units into the skin daily. 30 mL 4   levothyroxine  (SYNTHROID , LEVOTHROID) 75 MCG tablet Take 75 mcg by mouth daily before breakfast.     montelukast  (SINGULAIR ) 5 MG chewable tablet Chew 5 mg by mouth at bedtime.     OLANZapine  (ZYPREXA ) 20 MG tablet Take 20 mg by mouth at bedtime.     olmesartan  (BENICAR ) 20 MG tablet Take 20 mg by mouth daily.     ondansetron  (ZOFRAN ) 4 MG tablet Take 4 mg by mouth every 8 (eight) hours as needed for nausea or vomiting.     pravastatin  (PRAVACHOL ) 20 MG tablet Take 20 mg by mouth daily.     propranolol  (INDERAL ) 10 MG tablet Take 1 tablet (10 mg total) by mouth 3 (three) times daily. 60 tablet 11   QUEtiapine  (SEROQUEL ) 100 MG tablet Take 100 mg by mouth at bedtime.     rosuvastatin (CRESTOR) 20 MG tablet Take 20 mg by mouth daily.     spironolactone  (ALDACTONE ) 25 MG tablet TAKE 1 TABLET BY MOUTH EVERY DAY 90 tablet 0   tirzepatide  (MOUNJARO ) 12.5 MG/0.5ML Pen Inject 12.5 mg into the skin once a week. 6 mL 4   triamcinolone cream (KENALOG) 0.1 % Apply 1 Application topically as needed.     Vibegron 75 MG TABS Take 75 mg by mouth daily in the afternoon.     No current facility-administered medications for this visit.    PAST MEDICAL HISTORY: Past Medical History:  Diagnosis Date   Bipolar disorder (HCC)    CHF (congestive heart failure) (HCC)    Depression    Bipolar   Dysrhythmia    GERD (gastroesophageal reflux disease)    History of hiatal hernia    History of kidney stones    History of stomach ulcers 1970s   bleeding   Hyperlipidemia    Hypertension    Hypothyroidism    Ischemic colitis, enteritis, or enterocolitis (HCC)    OSA on CPAP    Pericarditis 05/06/2017   Pneumonia    several times (06/23/2017)   Thyroid  disease    TIA (transient ischemic attack) 2011 X 2   Type II diabetes mellitus (HCC)    Urinary bladder incontinence    Vertigo     PAST SURGICAL HISTORY: Past  Surgical History:  Procedure Laterality Date   APPENDECTOMY     BOTOX  INJECTION N/A 11/23/2022   Procedure: CYSTOBOTOX INJECTION;  Surgeon: Gaston Hamilton, MD;  Location: [facility];  Service: Urology;  Laterality: N/A;   BREAST CYST ASPIRATION Bilateral    CARDIAC CATHETERIZATION N/A 12/24/2014  Procedure: Right/Left Heart Cath and Coronary Angiography;  Surgeon: Jerel Balding, MD;  Location: [facility] INVASIVE [facility];  Service: Cardiovascular;  Laterality: N/A;   CATARACT EXTRACTION W/ INTRAOCULAR LENS  IMPLANT, BILATERAL Bilateral    EXCISIONAL HEMORRHOIDECTOMY     EYE SURGERY     FRACTURE SURGERY     IR THORACENTESIS ASP PLEURAL SPACE W/IMG GUIDE  05/13/2017   LAPAROSCOPIC CHOLECYSTECTOMY     LITHOTRIPSY  several times   RETINAL DETACHMENT SURGERY     think it was on the left; not sure (06/23/2017)   TONSILLECTOMY     VAGINAL HYSTERECTOMY     partial     FAMILY HISTORY: Family History  Problem Relation Age of Onset   Heart disease Mother    Stroke Father    Parkinson's disease Father    Cancer Sister    Cancer Brother     SOCIAL HISTORY: Social History   Socioeconomic History   Marital status: Widowed    Spouse name: Not on file   Number of children: 2   Years of education: 15   Highest education level: Not on file  Occupational History   Occupation: Retired  Tobacco Use   Smoking status: Never   Smokeless tobacco: Never  Vaping Use   Vaping status: Never Used  Substance and Sexual Activity   Alcohol use: Never   Drug use: Never   Sexual activity: Not on file  Other Topics Concern   Not on file  Social History Narrative   Lives at home alone.   Right-handed.   No caffeine use.   Social Drivers of Corporate investment banker Strain: Not on file  Food Insecurity: Not on file  Transportation Needs: Not on file  Physical Activity: Not on file  Stress: Not on file  Social Connections: Not on file  Intimate Partner Violence: Not on file      Modena Callander,  M.D. Ph.D.  [facility] [address], [address] [place], [place] [address] Ph: [phone] Fax: [phone]  CC:  Verdia Lombard, MD [address] [place],  [place] [address]  Verdia Lombard, MD

## 2023-09-12 NOTE — Telephone Encounter (Signed)
This is being handled in a different encounter.

## 2023-09-12 NOTE — Telephone Encounter (Signed)
 Pt is shaking, dizzy and Nauseated for an hour.  Pt is asking for a call form RN to discuss, pt said she reached out to her PCP and they told her to call her Neurologist because it is related to what she is treated for here.

## 2023-09-13 ENCOUNTER — Ambulatory Visit

## 2023-09-13 ENCOUNTER — Telehealth: Payer: Self-pay | Admitting: Neurology

## 2023-09-13 NOTE — Telephone Encounter (Signed)
 [facility] is going to take this patient.

## 2023-09-14 NOTE — Telephone Encounter (Signed)
 FYI...  Dianne From [facility] called to let MD Know Pt will be starting therapy today.

## 2023-09-15 ENCOUNTER — Telehealth: Payer: Self-pay | Admitting: Neurology

## 2023-09-15 ENCOUNTER — Ambulatory Visit: Admitting: Physical Therapy

## 2023-09-15 DIAGNOSIS — E1142 Type 2 diabetes mellitus with diabetic polyneuropathy: Secondary | ICD-10-CM | POA: Diagnosis not present

## 2023-09-15 DIAGNOSIS — I5032 Chronic diastolic (congestive) heart failure: Secondary | ICD-10-CM | POA: Diagnosis not present

## 2023-09-15 DIAGNOSIS — E1165 Type 2 diabetes mellitus with hyperglycemia: Secondary | ICD-10-CM | POA: Diagnosis not present

## 2023-09-15 DIAGNOSIS — I4891 Unspecified atrial fibrillation: Secondary | ICD-10-CM | POA: Diagnosis not present

## 2023-09-15 DIAGNOSIS — E039 Hypothyroidism, unspecified: Secondary | ICD-10-CM | POA: Diagnosis not present

## 2023-09-15 DIAGNOSIS — I130 Hypertensive heart and chronic kidney disease with heart failure and stage 1 through stage 4 chronic kidney disease, or unspecified chronic kidney disease: Secondary | ICD-10-CM | POA: Diagnosis not present

## 2023-09-15 DIAGNOSIS — N1831 Chronic kidney disease, stage 3a: Secondary | ICD-10-CM | POA: Diagnosis not present

## 2023-09-15 DIAGNOSIS — F319 Bipolar disorder, unspecified: Secondary | ICD-10-CM | POA: Diagnosis not present

## 2023-09-15 DIAGNOSIS — E1122 Type 2 diabetes mellitus with diabetic chronic kidney disease: Secondary | ICD-10-CM | POA: Diagnosis not present

## 2023-09-15 NOTE — Telephone Encounter (Signed)
 Returned call to tanner and stated that we are agreeable to verbal orders. Tanner voiced gratitude and understanding

## 2023-09-15 NOTE — Telephone Encounter (Signed)
 Tanner, PT w/ [facility]  has called for verbal orders for home health 2 week 6 and 1 week 2 for balance and strengthening. Also order needed for OT and skilled nursing for medication management.  Tanner's vm is secure at [phone]

## 2023-09-20 ENCOUNTER — Ambulatory Visit: Admitting: Physical Therapy

## 2023-09-20 DIAGNOSIS — I4891 Unspecified atrial fibrillation: Secondary | ICD-10-CM | POA: Diagnosis not present

## 2023-09-20 DIAGNOSIS — I5032 Chronic diastolic (congestive) heart failure: Secondary | ICD-10-CM | POA: Diagnosis not present

## 2023-09-20 DIAGNOSIS — E1165 Type 2 diabetes mellitus with hyperglycemia: Secondary | ICD-10-CM | POA: Diagnosis not present

## 2023-09-20 DIAGNOSIS — N1831 Chronic kidney disease, stage 3a: Secondary | ICD-10-CM | POA: Diagnosis not present

## 2023-09-20 DIAGNOSIS — I130 Hypertensive heart and chronic kidney disease with heart failure and stage 1 through stage 4 chronic kidney disease, or unspecified chronic kidney disease: Secondary | ICD-10-CM | POA: Diagnosis not present

## 2023-09-20 DIAGNOSIS — E1122 Type 2 diabetes mellitus with diabetic chronic kidney disease: Secondary | ICD-10-CM | POA: Diagnosis not present

## 2023-09-20 DIAGNOSIS — E039 Hypothyroidism, unspecified: Secondary | ICD-10-CM | POA: Diagnosis not present

## 2023-09-20 DIAGNOSIS — E1142 Type 2 diabetes mellitus with diabetic polyneuropathy: Secondary | ICD-10-CM | POA: Diagnosis not present

## 2023-09-20 DIAGNOSIS — F319 Bipolar disorder, unspecified: Secondary | ICD-10-CM | POA: Diagnosis not present

## 2023-09-20 NOTE — Telephone Encounter (Signed)
 Returned call to pt tanner and left vo approval on secure vm.

## 2023-09-20 NOTE — Telephone Encounter (Signed)
 At 1:53 pm  OT  Jim from [facility]  left a vm asking for verbal orders for 1 week 4 for OT he can be reached at [phone]

## 2023-09-21 DIAGNOSIS — E1165 Type 2 diabetes mellitus with hyperglycemia: Secondary | ICD-10-CM | POA: Diagnosis not present

## 2023-09-22 ENCOUNTER — Ambulatory Visit: Admitting: Physical Therapy

## 2023-09-22 DIAGNOSIS — I130 Hypertensive heart and chronic kidney disease with heart failure and stage 1 through stage 4 chronic kidney disease, or unspecified chronic kidney disease: Secondary | ICD-10-CM | POA: Diagnosis not present

## 2023-09-22 DIAGNOSIS — E039 Hypothyroidism, unspecified: Secondary | ICD-10-CM | POA: Diagnosis not present

## 2023-09-22 DIAGNOSIS — E1142 Type 2 diabetes mellitus with diabetic polyneuropathy: Secondary | ICD-10-CM | POA: Diagnosis not present

## 2023-09-22 DIAGNOSIS — E1122 Type 2 diabetes mellitus with diabetic chronic kidney disease: Secondary | ICD-10-CM | POA: Diagnosis not present

## 2023-09-22 DIAGNOSIS — I5032 Chronic diastolic (congestive) heart failure: Secondary | ICD-10-CM | POA: Diagnosis not present

## 2023-09-22 DIAGNOSIS — I4891 Unspecified atrial fibrillation: Secondary | ICD-10-CM | POA: Diagnosis not present

## 2023-09-22 DIAGNOSIS — E1165 Type 2 diabetes mellitus with hyperglycemia: Secondary | ICD-10-CM | POA: Diagnosis not present

## 2023-09-22 DIAGNOSIS — N1831 Chronic kidney disease, stage 3a: Secondary | ICD-10-CM | POA: Diagnosis not present

## 2023-09-22 DIAGNOSIS — F319 Bipolar disorder, unspecified: Secondary | ICD-10-CM | POA: Diagnosis not present

## 2023-09-23 ENCOUNTER — Other Ambulatory Visit

## 2023-09-23 DIAGNOSIS — F319 Bipolar disorder, unspecified: Secondary | ICD-10-CM | POA: Diagnosis not present

## 2023-09-23 DIAGNOSIS — I5032 Chronic diastolic (congestive) heart failure: Secondary | ICD-10-CM | POA: Diagnosis not present

## 2023-09-23 DIAGNOSIS — E1142 Type 2 diabetes mellitus with diabetic polyneuropathy: Secondary | ICD-10-CM | POA: Diagnosis not present

## 2023-09-23 DIAGNOSIS — E039 Hypothyroidism, unspecified: Secondary | ICD-10-CM | POA: Diagnosis not present

## 2023-09-23 DIAGNOSIS — I4891 Unspecified atrial fibrillation: Secondary | ICD-10-CM | POA: Diagnosis not present

## 2023-09-23 DIAGNOSIS — I130 Hypertensive heart and chronic kidney disease with heart failure and stage 1 through stage 4 chronic kidney disease, or unspecified chronic kidney disease: Secondary | ICD-10-CM | POA: Diagnosis not present

## 2023-09-23 DIAGNOSIS — E1165 Type 2 diabetes mellitus with hyperglycemia: Secondary | ICD-10-CM | POA: Diagnosis not present

## 2023-09-23 DIAGNOSIS — N1831 Chronic kidney disease, stage 3a: Secondary | ICD-10-CM | POA: Diagnosis not present

## 2023-09-23 DIAGNOSIS — E1122 Type 2 diabetes mellitus with diabetic chronic kidney disease: Secondary | ICD-10-CM | POA: Diagnosis not present

## 2023-09-27 ENCOUNTER — Ambulatory Visit: Admitting: Physical Therapy

## 2023-09-27 DIAGNOSIS — I130 Hypertensive heart and chronic kidney disease with heart failure and stage 1 through stage 4 chronic kidney disease, or unspecified chronic kidney disease: Secondary | ICD-10-CM | POA: Diagnosis not present

## 2023-09-27 DIAGNOSIS — E039 Hypothyroidism, unspecified: Secondary | ICD-10-CM | POA: Diagnosis not present

## 2023-09-27 DIAGNOSIS — N1831 Chronic kidney disease, stage 3a: Secondary | ICD-10-CM | POA: Diagnosis not present

## 2023-09-27 DIAGNOSIS — I5032 Chronic diastolic (congestive) heart failure: Secondary | ICD-10-CM | POA: Diagnosis not present

## 2023-09-27 DIAGNOSIS — E1142 Type 2 diabetes mellitus with diabetic polyneuropathy: Secondary | ICD-10-CM | POA: Diagnosis not present

## 2023-09-27 DIAGNOSIS — E1165 Type 2 diabetes mellitus with hyperglycemia: Secondary | ICD-10-CM | POA: Diagnosis not present

## 2023-09-27 DIAGNOSIS — E1122 Type 2 diabetes mellitus with diabetic chronic kidney disease: Secondary | ICD-10-CM | POA: Diagnosis not present

## 2023-09-27 DIAGNOSIS — I4891 Unspecified atrial fibrillation: Secondary | ICD-10-CM | POA: Diagnosis not present

## 2023-09-27 DIAGNOSIS — F319 Bipolar disorder, unspecified: Secondary | ICD-10-CM | POA: Diagnosis not present

## 2023-09-28 DIAGNOSIS — E1165 Type 2 diabetes mellitus with hyperglycemia: Secondary | ICD-10-CM | POA: Diagnosis not present

## 2023-09-28 DIAGNOSIS — I130 Hypertensive heart and chronic kidney disease with heart failure and stage 1 through stage 4 chronic kidney disease, or unspecified chronic kidney disease: Secondary | ICD-10-CM | POA: Diagnosis not present

## 2023-09-28 DIAGNOSIS — Z794 Long term (current) use of insulin: Secondary | ICD-10-CM | POA: Diagnosis not present

## 2023-09-28 DIAGNOSIS — E1122 Type 2 diabetes mellitus with diabetic chronic kidney disease: Secondary | ICD-10-CM | POA: Diagnosis not present

## 2023-09-28 DIAGNOSIS — I5032 Chronic diastolic (congestive) heart failure: Secondary | ICD-10-CM | POA: Diagnosis not present

## 2023-09-28 DIAGNOSIS — E1142 Type 2 diabetes mellitus with diabetic polyneuropathy: Secondary | ICD-10-CM | POA: Diagnosis not present

## 2023-09-28 DIAGNOSIS — F319 Bipolar disorder, unspecified: Secondary | ICD-10-CM | POA: Diagnosis not present

## 2023-09-28 DIAGNOSIS — I4891 Unspecified atrial fibrillation: Secondary | ICD-10-CM | POA: Diagnosis not present

## 2023-09-28 DIAGNOSIS — N1831 Chronic kidney disease, stage 3a: Secondary | ICD-10-CM | POA: Diagnosis not present

## 2023-09-28 DIAGNOSIS — E039 Hypothyroidism, unspecified: Secondary | ICD-10-CM | POA: Diagnosis not present

## 2023-09-29 ENCOUNTER — Ambulatory Visit: Admitting: Physical Therapy

## 2023-09-29 ENCOUNTER — Other Ambulatory Visit: Payer: Self-pay | Admitting: Cardiovascular Disease

## 2023-09-29 DIAGNOSIS — I4892 Unspecified atrial flutter: Secondary | ICD-10-CM

## 2023-09-29 LAB — MICROALBUMIN / CREATININE URINE RATIO
Creatinine, Urine: 25 mg/dL (ref 20–275)
Microalb Creat Ratio: 8 mg/g{creat} (ref <30)
Microalb, Ur: 0.2 mg/dL

## 2023-09-30 ENCOUNTER — Encounter: Payer: Self-pay | Admitting: Endocrinology

## 2023-09-30 ENCOUNTER — Ambulatory Visit (INDEPENDENT_AMBULATORY_CARE_PROVIDER_SITE_OTHER): Admitting: Endocrinology

## 2023-09-30 ENCOUNTER — Ambulatory Visit: Payer: Self-pay | Admitting: Endocrinology

## 2023-09-30 VITALS — BP 108/50 | HR 67 | Resp 20 | Ht 63.0 in | Wt 215.0 lb

## 2023-09-30 DIAGNOSIS — Z794 Long term (current) use of insulin: Secondary | ICD-10-CM | POA: Diagnosis not present

## 2023-09-30 DIAGNOSIS — E1169 Type 2 diabetes mellitus with other specified complication: Secondary | ICD-10-CM

## 2023-09-30 LAB — POCT GLYCOSYLATED HEMOGLOBIN (HGB A1C): Hemoglobin A1C: 7.8 % — AB (ref 4.0–5.6)

## 2023-09-30 NOTE — Progress Notes (Signed)
 Patient's vitals were mainly normal however stated she felt light headed her BP was low for her. MD made aware.

## 2023-09-30 NOTE — Patient Instructions (Signed)
 Lantus  25 units daily at bedtime.  Farxiga  10 mg daily.  Continue mounjaro  12.5 mg weekly.  Cequr 3 clicks with 3 times a day with meals before eating.

## 2023-09-30 NOTE — Telephone Encounter (Signed)
 Prescription refill request for Eliquis  received. Indication: a flutter Last office visit: 04/13/23 Scr: 1.22 epic 09/06/23 Age: 83 Weight: 96kg

## 2023-09-30 NOTE — Progress Notes (Signed)
 Outpatient Endocrinology Note [place] Jaylon Grode, MD  09/30/23  Patient's Name: [name]    DOB: 06/12/1940    MRN: [id number]                                                    REASON OF VISIT: Follow up for type 2 diabetes mellitus  PCP: Verdia Lombard, MD  HISTORY OF PRESENT ILLNESS:   [name] is a 83 y.o. old female with past medical history listed below, is here for follow up of type 2 diabetes mellitus.   Pertinent Diabetes History: Patient was diagnosed with type 2 diabetes mellitus in 2011.  She was apparently intolerant to metformin  and was treated with Onglyza until about 2014.  Insulin  therapy was started around 2014 initially on basal insulin  and later mealtime insulin  was added.  She has been on V-Go insulin  pump since 09/2014.   Chronic Diabetes Complications : Retinopathy: no. Last ophthalmology exam was done on annually, reportedly.  Nephropathy: no, on olmesartan  Peripheral neuropathy: yes, painful paresthesia, on gabapentin . Coronary artery disease: no Stroke: yes, TIA  Relevant comorbidities and cardiovascular risk factors: Obesity: yes Body mass index is 38.09 kg/m.  Hypertension: yes Hyperlipidemia. Yes,on statin  Current / Home Diabetic regimen includes: Farxiga  10 mg daily. Mounjaro  12.5 mg weekly. Lantus  25 units at bedtime.  She is actually taking Lantus  20 units 2 times a day. CeQur simplicity  14 units ( 7 squeezes)  with meals three times a day.  Taking after meal.  Lantus  was gradually decreased from 40 to 35 to 25 units daily in between visits.  Prior diabetic medications: Metformin  - intolerance.  Onglyza V-Go pump 40 units basal, meal times with 10 - 14 units with breakfast, lunch and dinner.  For  meals breakfast, lunch and supper 5-7 click depending upon meal size. For large meal take 7 click, regular meal take 6 click and smaller meal 5 clicks.  This will be 10 to 14 units with each meals.  Glycemic data:     CONTINUOUS GLUCOSE MONITORING SYSTEM (CGMS) INTERPRETATION: At today's visit, we reviewed CGM downloads. The full report is scanned in the media. Reviewing the CGM trends, blood glucose are as follows:  FreeStyle Libre 3 CGM-  Sensor Download (Sensor download was reviewed and summarized below.) Dates: August 30 to September 30, 2023, 14 days Sensor Average: 108 Glucose Management Indicator: 5.9%  % data captured: 94%     Previous:    Interpretation: Frequent hypoglycemia overnight and in between the meals with blood sugar in the range of 50-60 when hypoglycemia.  Blood sugar with meals with mild hyperglycemia up to low 200s and mostly blood sugar in the afternoon acceptable.  Hypoglycemia is related to high dose of Lantus  and mealtime insulin .  Hypoglycemia: Patient has frequent  hypoglycemic episodes. Patient has hypoglycemia awareness.  Factors modifying glucose control: 1.  Diabetic diet assessment: three meals a day.  2.  Staying active or exercising: no formal exercise. Walks indoor.   3.  Medication compliance: compliant all of the time.  # Primary hypothyroidism : for several years, on levothyroxine  75 mcg daily,  managed by PCP.   Interval history  CGM data as reviewed above.  Frequent hypoglycemia.  Patient is actually taking Lantus  20 units 2 times a day it was planned and ordered as 25  units in the last visit, she is still doing mealtime insulin  through CeQur 7 clicks which is 14 units for the meals 3 times a day.  She has been taking Mounjaro  12.5 mg weekly.  She reports occasional stomach pain otherwise no significant GI issues.  Patient complains of mild lightheadedness.  She reports it has been going on for several months.  She is on multiple antihypertensive medication including Cardizem  of 240 mg daily.  Her blood pressure is on the lower side in the clinic today.  She is does not have hypoglycemia in the clinic blood sugar is 115 on CGM monitor.  REVIEW OF  SYSTEMS As per history of present illness.   PAST MEDICAL HISTORY: Past Medical History:  Diagnosis Date   Bipolar disorder (HCC)    CHF (congestive heart failure) (HCC)    Depression    Bipolar   Dysrhythmia    GERD (gastroesophageal reflux disease)    History of hiatal hernia    History of kidney stones    History of stomach ulcers 1970s   bleeding   Hyperlipidemia    Hypertension    Hypothyroidism    Ischemic colitis, enteritis, or enterocolitis (HCC)    OSA on CPAP    Pericarditis 05/06/2017   Pneumonia    several times (06/23/2017)   Thyroid  disease    TIA (transient ischemic attack) 2011 X 2   Type II diabetes mellitus (HCC)    Urinary bladder incontinence    Vertigo     PAST SURGICAL HISTORY: Past Surgical History:  Procedure Laterality Date   APPENDECTOMY     BOTOX  INJECTION N/A 11/23/2022   Procedure: CYSTOBOTOX INJECTION;  Surgeon: Gaston Hamilton, MD;  Location: [facility];  Service: Urology;  Laterality: N/A;   BREAST CYST ASPIRATION Bilateral    CARDIAC CATHETERIZATION N/A 12/24/2014   Procedure: Right/Left Heart Cath and Coronary Angiography;  Surgeon: Jerel Balding, MD;  Location: [facility];  Service: Cardiovascular;  Laterality: N/A;   CATARACT EXTRACTION W/ INTRAOCULAR LENS  IMPLANT, BILATERAL Bilateral    EXCISIONAL HEMORRHOIDECTOMY     EYE SURGERY     FRACTURE SURGERY     IR THORACENTESIS ASP PLEURAL SPACE W/IMG GUIDE  05/13/2017   LAPAROSCOPIC CHOLECYSTECTOMY     LITHOTRIPSY  several times   RETINAL DETACHMENT SURGERY     think it was on the left; not sure (06/23/2017)   TONSILLECTOMY     VAGINAL HYSTERECTOMY     partial     ALLERGIES: Allergies  Allergen Reactions   Flagyl [Metronidazole Hcl] Itching and Rash   Ciprofloxacin Itching and Rash   Metformin  And Related Rash   Methimazole Rash   Milk-Related Compounds Other (See Comments)    Stomach pains   Other Other (See Comments)    [place] nuts cause severe facial redness     FAMILY HISTORY:  Family History  Problem Relation Age of Onset   Heart disease Mother    Stroke Father    Parkinson's disease Father    Cancer Sister    Cancer Brother     SOCIAL HISTORY: Social History   Socioeconomic History   Marital status: Widowed    Spouse name: Not on file   Number of children: 2   Years of education: 59   Highest education level: Not on file  Occupational History   Occupation: Retired  Tobacco Use   Smoking status: Never   Smokeless tobacco: Never  Vaping Use   Vaping status: Never Used  Substance and Sexual Activity   Alcohol use: Never   Drug use: Never   Sexual activity: Not on file  Other Topics Concern   Not on file  Social History Narrative   Lives at home alone.   Right-handed.   No caffeine use.   Social Drivers of Corporate investment banker Strain: Not on file  Food Insecurity: Not on file  Transportation Needs: Not on file  Physical Activity: Not on file  Stress: Not on file  Social Connections: Not on file    MEDICATIONS:  Current Outpatient Medications  Medication Sig Dispense Refill   apixaban  (ELIQUIS ) 5 MG TABS tablet TAKE 1 TABLET BY MOUTH 2 TIMES A DAY 180 tablet 1   carbamazepine  (CARBATROL ) 200 MG 12 hr capsule Take 200 mg by mouth 2 (two) times daily.      Cholecalciferol  (QC VITAMIN D3) 50 MCG (2000 UT) TABS Take 2,000 Units by mouth daily.     clobetasol (TEMOVATE) 0.05 % GEL Apply 1 application topically 2 (two) times daily as needed (rash).      diltiazem  (CARDIZEM  CD) 240 MG 24 hr capsule TAKE 1 CAPSULE BY MOUTH DAILY 90 capsule 3   donepezil  (ARICEPT ) 10 MG tablet Take 10 mg by mouth at bedtime.     esomeprazole  (NEXIUM ) 40 MG capsule Take 1 capsule (40 mg total) by mouth daily at 12 noon. 30 capsule 2   famotidine  (PEPCID ) 20 MG tablet Take 1 tablet (20 mg total) by mouth 2 (two) times daily. 60 tablet 1   FARXIGA  10 MG TABS tablet TAKE 1 TABLET BY MOUTH DAILY 90 tablet 2   folic acid  (FOLVITE ) 1 MG  tablet Take 1 mg by mouth daily.     furosemide  (LASIX ) 40 MG tablet TAKE 1 TABLET BY MOUTH EVERY DAY 90 tablet 1   gabapentin  (NEURONTIN ) 300 MG capsule Take 300 mg by mouth 4 (four) times daily. Patient takes 1 capsule in the am, 1 capsule in the afternoon and two capsules in the evening     HYDROcodone -acetaminophen  (NORCO/VICODIN) 5-325 MG tablet Take 1 tablet by mouth every 6 (six) hours as needed.     insulin  aspart (NOVOLOG ) 100 UNIT/ML injection Use up to 40 units / day via CeQur patch 10 mL 11   insulin  glargine (LANTUS  SOLOSTAR) 100 UNIT/ML Solostar Pen Inject 25 Units into the skin daily. 30 mL 4   levothyroxine  (SYNTHROID , LEVOTHROID) 75 MCG tablet Take 75 mcg by mouth daily before breakfast.     montelukast  (SINGULAIR ) 5 MG chewable tablet Chew 5 mg by mouth at bedtime.     OLANZapine  (ZYPREXA ) 20 MG tablet Take 20 mg by mouth at bedtime.     olmesartan  (BENICAR ) 20 MG tablet Take 20 mg by mouth daily.     ondansetron  (ZOFRAN ) 4 MG tablet Take 4 mg by mouth every 8 (eight) hours as needed for nausea or vomiting.     pravastatin  (PRAVACHOL ) 20 MG tablet Take 20 mg by mouth daily.     propranolol  (INDERAL ) 10 MG tablet Take 1 tablet (10 mg total) by mouth 3 (three) times daily. 60 tablet 11   QUEtiapine  (SEROQUEL ) 100 MG tablet Take 100 mg by mouth at bedtime.     rosuvastatin (CRESTOR) 20 MG tablet Take 20 mg by mouth daily.     spironolactone  (ALDACTONE ) 25 MG tablet TAKE 1 TABLET BY MOUTH EVERY DAY 90 tablet 0   tirzepatide  (MOUNJARO ) 12.5 MG/0.5ML Pen Inject 12.5 mg into the skin once  a week. 6 mL 4   triamcinolone cream (KENALOG) 0.1 % Apply 1 Application topically as needed.     Vibegron 75 MG TABS Take 75 mg by mouth daily in the afternoon.     No current facility-administered medications for this visit.    PHYSICAL EXAM: Vitals:   09/30/23 1120  BP: (!) 108/50  Pulse: 67  Resp: 20  SpO2: 98%  Weight: 215 lb (97.5 kg)  Height: 5' 3 (1.6 m)     Body mass index is  38.09 kg/m.  Wt Readings from Last 3 Encounters:  09/30/23 215 lb (97.5 kg)  09/12/23 212 lb (96.2 kg)  09/06/23 224 lb (101.6 kg)    General: Well developed, well nourished female in no apparent distress.  HEENT: AT/[place], no external lesions.  Eyes: Conjunctiva clear and no icterus. Neck: Neck supple  Lungs: Respirations not labored Neurologic: Alert, oriented, normal speech Extremities / Skin: Dry. B/l pedal edema.  Psychiatric: Does not appear depressed or anxious  Diabetic Foot Exam - Simple   No data filed    LABS Reviewed Lab Results  Component Value Date   HGBA1C 7.8 (A) 09/30/2023   HGBA1C 7.6 (A) 06/23/2023   HGBA1C 7.1 (A) 03/18/2023   Lab Results  Component Value Date   FRUCTOSAMINE 267 05/28/2021   FRUCTOSAMINE 223 09/16/2020   FRUCTOSAMINE 259 10/17/2017   Lab Results  Component Value Date   CHOL 149 03/26/2017   HDL 56 12/24/2019   LDLCALC 82 12/24/2019   TRIG 130 03/26/2017   CHOLHDL 3.0 03/26/2017   Lab Results  Component Value Date   MICRALBCREAT 8 09/28/2023   Lab Results  Component Value Date   CREATININE 1.22 (H) 09/06/2023   Lab Results  Component Value Date   GFR 36.20 (L) 11/25/2022    ASSESSMENT / PLAN  1. Type 2 diabetes mellitus with other specified complication, with long-term current use of insulin  (HCC)    Diabetes Mellitus type 2, complicated by peripheral neuropathy. - Diabetic status / severity: Fair control.  Lab Results  Component Value Date   HGBA1C 7.8 (A) 09/30/2023    - Hemoglobin A1c goal : <7.5%  Overall acceptable control of diabetes.    She has frequent hypoglycemia related to high dose of basal insulin  and mealtime insulin .  She is currently taking Lantus  20 units 2 times a day.  Patient is provided written instruction and also she verbalizes the understanding of insulin  doses as follows in the clinic today.  - Medications:  Diabetes regimen - Decrease Lantus  from 20 units 2 times a day to 25 units  daily.  Lantus  25 units daily.  - Continue Mounjaro  2.5 mg weekly.  -Continue Farxiga  10 mg daily.  -Decrease CeQur simplicity 7 to 3 squeezes / 14 to 6 units with meals 3 times a day, advised to take before eating.  - Home glucose testing: Freestyle libre 3 CGM and check as needed.  - Discussed/ Gave Hypoglycemia treatment plan.  # Consult : No referral at this time.  # Annual urine for microalbuminuria/ creatinine ratio, no microalbuminuria currently, continue ACE/ARB /olmesartan . Last  Lab Results  Component Value Date   MICRALBCREAT 8 09/28/2023    # Foot check nightly / neuropathy, continue gabapentin .  # Annual dilated diabetic eye exams.   - Diet: Make healthy diabetic food choices - Life style / activity / exercise: Discussed.  2. Blood pressure  -  BP Readings from Last 1 Encounters:  09/30/23 (!) 108/50    -  Control is not in target. Patient is asked to call primary care provider office regarding adjustment of antihypertensive medications due to low blood pressure. - No change in current plans.    3. Lipid status / Hyperlipidemia - Last  Lab Results  Component Value Date   LDLCALC 82 12/24/2019   - Continue pravastatin  20 mg daily.  Managed by primary care provider.  Diagnoses and all orders for this visit:  Type 2 diabetes mellitus with other specified complication, with long-term current use of insulin  (HCC) -     POCT glycosylated hemoglobin (Hb A1C)   DISPOSITION Follow up in clinic in 3 months suggested.   All questions answered and patient verbalized understanding of the plan.  [place] Alexei Doswell, MD [facility] [address] [place], [address] [place], [place] [address] Phone # [phone]  At least part of this note was generated using voice recognition software. Inadvertent word errors may have occurred, which were not recognized during the proofreading process.

## 2023-10-03 DIAGNOSIS — I4891 Unspecified atrial fibrillation: Secondary | ICD-10-CM | POA: Diagnosis not present

## 2023-10-03 DIAGNOSIS — I5032 Chronic diastolic (congestive) heart failure: Secondary | ICD-10-CM | POA: Diagnosis not present

## 2023-10-03 DIAGNOSIS — N1831 Chronic kidney disease, stage 3a: Secondary | ICD-10-CM | POA: Diagnosis not present

## 2023-10-03 DIAGNOSIS — E1165 Type 2 diabetes mellitus with hyperglycemia: Secondary | ICD-10-CM | POA: Diagnosis not present

## 2023-10-03 DIAGNOSIS — E1142 Type 2 diabetes mellitus with diabetic polyneuropathy: Secondary | ICD-10-CM | POA: Diagnosis not present

## 2023-10-03 DIAGNOSIS — E039 Hypothyroidism, unspecified: Secondary | ICD-10-CM | POA: Diagnosis not present

## 2023-10-03 DIAGNOSIS — I130 Hypertensive heart and chronic kidney disease with heart failure and stage 1 through stage 4 chronic kidney disease, or unspecified chronic kidney disease: Secondary | ICD-10-CM | POA: Diagnosis not present

## 2023-10-03 DIAGNOSIS — F319 Bipolar disorder, unspecified: Secondary | ICD-10-CM | POA: Diagnosis not present

## 2023-10-03 DIAGNOSIS — E1122 Type 2 diabetes mellitus with diabetic chronic kidney disease: Secondary | ICD-10-CM | POA: Diagnosis not present

## 2023-10-11 DIAGNOSIS — F319 Bipolar disorder, unspecified: Secondary | ICD-10-CM | POA: Diagnosis not present

## 2023-10-11 DIAGNOSIS — E1122 Type 2 diabetes mellitus with diabetic chronic kidney disease: Secondary | ICD-10-CM | POA: Diagnosis not present

## 2023-10-11 DIAGNOSIS — E039 Hypothyroidism, unspecified: Secondary | ICD-10-CM | POA: Diagnosis not present

## 2023-10-11 DIAGNOSIS — I4891 Unspecified atrial fibrillation: Secondary | ICD-10-CM | POA: Diagnosis not present

## 2023-10-11 DIAGNOSIS — E1142 Type 2 diabetes mellitus with diabetic polyneuropathy: Secondary | ICD-10-CM | POA: Diagnosis not present

## 2023-10-11 DIAGNOSIS — E1165 Type 2 diabetes mellitus with hyperglycemia: Secondary | ICD-10-CM | POA: Diagnosis not present

## 2023-10-11 DIAGNOSIS — I130 Hypertensive heart and chronic kidney disease with heart failure and stage 1 through stage 4 chronic kidney disease, or unspecified chronic kidney disease: Secondary | ICD-10-CM | POA: Diagnosis not present

## 2023-10-11 DIAGNOSIS — N1831 Chronic kidney disease, stage 3a: Secondary | ICD-10-CM | POA: Diagnosis not present

## 2023-10-11 DIAGNOSIS — I5032 Chronic diastolic (congestive) heart failure: Secondary | ICD-10-CM | POA: Diagnosis not present

## 2023-10-11 NOTE — Telephone Encounter (Signed)
 Tanner from [facility] called returning call , Informed Nurse will call back   Phone # [phone]

## 2023-10-11 NOTE — Telephone Encounter (Signed)
 Call to tanner, verbal orders given as requested

## 2023-10-13 DIAGNOSIS — N1831 Chronic kidney disease, stage 3a: Secondary | ICD-10-CM | POA: Diagnosis not present

## 2023-10-13 DIAGNOSIS — I130 Hypertensive heart and chronic kidney disease with heart failure and stage 1 through stage 4 chronic kidney disease, or unspecified chronic kidney disease: Secondary | ICD-10-CM | POA: Diagnosis not present

## 2023-10-13 DIAGNOSIS — E1165 Type 2 diabetes mellitus with hyperglycemia: Secondary | ICD-10-CM | POA: Diagnosis not present

## 2023-10-13 DIAGNOSIS — I10 Essential (primary) hypertension: Secondary | ICD-10-CM | POA: Diagnosis not present

## 2023-10-13 DIAGNOSIS — M25551 Pain in right hip: Secondary | ICD-10-CM | POA: Diagnosis not present

## 2023-10-13 DIAGNOSIS — E782 Mixed hyperlipidemia: Secondary | ICD-10-CM | POA: Diagnosis not present

## 2023-10-13 DIAGNOSIS — I5032 Chronic diastolic (congestive) heart failure: Secondary | ICD-10-CM | POA: Diagnosis not present

## 2023-10-14 DIAGNOSIS — I5032 Chronic diastolic (congestive) heart failure: Secondary | ICD-10-CM | POA: Diagnosis not present

## 2023-10-14 DIAGNOSIS — N1831 Chronic kidney disease, stage 3a: Secondary | ICD-10-CM | POA: Diagnosis not present

## 2023-10-14 DIAGNOSIS — E1122 Type 2 diabetes mellitus with diabetic chronic kidney disease: Secondary | ICD-10-CM | POA: Diagnosis not present

## 2023-10-14 DIAGNOSIS — M25551 Pain in right hip: Secondary | ICD-10-CM | POA: Diagnosis not present

## 2023-10-14 DIAGNOSIS — I4891 Unspecified atrial fibrillation: Secondary | ICD-10-CM | POA: Diagnosis not present

## 2023-10-14 DIAGNOSIS — F319 Bipolar disorder, unspecified: Secondary | ICD-10-CM | POA: Diagnosis not present

## 2023-10-14 DIAGNOSIS — E1165 Type 2 diabetes mellitus with hyperglycemia: Secondary | ICD-10-CM | POA: Diagnosis not present

## 2023-10-14 DIAGNOSIS — E039 Hypothyroidism, unspecified: Secondary | ICD-10-CM | POA: Diagnosis not present

## 2023-10-14 DIAGNOSIS — E1142 Type 2 diabetes mellitus with diabetic polyneuropathy: Secondary | ICD-10-CM | POA: Diagnosis not present

## 2023-10-14 DIAGNOSIS — I130 Hypertensive heart and chronic kidney disease with heart failure and stage 1 through stage 4 chronic kidney disease, or unspecified chronic kidney disease: Secondary | ICD-10-CM | POA: Diagnosis not present

## 2023-10-15 DIAGNOSIS — E1165 Type 2 diabetes mellitus with hyperglycemia: Secondary | ICD-10-CM | POA: Diagnosis not present

## 2023-10-15 DIAGNOSIS — F319 Bipolar disorder, unspecified: Secondary | ICD-10-CM | POA: Diagnosis not present

## 2023-10-15 DIAGNOSIS — I5032 Chronic diastolic (congestive) heart failure: Secondary | ICD-10-CM | POA: Diagnosis not present

## 2023-10-15 DIAGNOSIS — N1831 Chronic kidney disease, stage 3a: Secondary | ICD-10-CM | POA: Diagnosis not present

## 2023-10-15 DIAGNOSIS — E039 Hypothyroidism, unspecified: Secondary | ICD-10-CM | POA: Diagnosis not present

## 2023-10-15 DIAGNOSIS — E1122 Type 2 diabetes mellitus with diabetic chronic kidney disease: Secondary | ICD-10-CM | POA: Diagnosis not present

## 2023-10-15 DIAGNOSIS — E1142 Type 2 diabetes mellitus with diabetic polyneuropathy: Secondary | ICD-10-CM | POA: Diagnosis not present

## 2023-10-15 DIAGNOSIS — I130 Hypertensive heart and chronic kidney disease with heart failure and stage 1 through stage 4 chronic kidney disease, or unspecified chronic kidney disease: Secondary | ICD-10-CM | POA: Diagnosis not present

## 2023-10-15 DIAGNOSIS — I4891 Unspecified atrial fibrillation: Secondary | ICD-10-CM | POA: Diagnosis not present

## 2023-10-17 ENCOUNTER — Encounter: Payer: Self-pay | Admitting: Cardiovascular Disease

## 2023-10-18 ENCOUNTER — Telehealth: Payer: Self-pay

## 2023-10-18 ENCOUNTER — Other Ambulatory Visit: Payer: Self-pay | Admitting: Cardiovascular Disease

## 2023-10-18 DIAGNOSIS — F319 Bipolar disorder, unspecified: Secondary | ICD-10-CM | POA: Diagnosis not present

## 2023-10-18 DIAGNOSIS — I4891 Unspecified atrial fibrillation: Secondary | ICD-10-CM | POA: Diagnosis not present

## 2023-10-18 DIAGNOSIS — N1831 Chronic kidney disease, stage 3a: Secondary | ICD-10-CM | POA: Diagnosis not present

## 2023-10-18 DIAGNOSIS — E1122 Type 2 diabetes mellitus with diabetic chronic kidney disease: Secondary | ICD-10-CM | POA: Diagnosis not present

## 2023-10-18 DIAGNOSIS — E1165 Type 2 diabetes mellitus with hyperglycemia: Secondary | ICD-10-CM | POA: Diagnosis not present

## 2023-10-18 DIAGNOSIS — I5032 Chronic diastolic (congestive) heart failure: Secondary | ICD-10-CM | POA: Diagnosis not present

## 2023-10-18 DIAGNOSIS — E039 Hypothyroidism, unspecified: Secondary | ICD-10-CM | POA: Diagnosis not present

## 2023-10-18 DIAGNOSIS — I130 Hypertensive heart and chronic kidney disease with heart failure and stage 1 through stage 4 chronic kidney disease, or unspecified chronic kidney disease: Secondary | ICD-10-CM | POA: Diagnosis not present

## 2023-10-18 DIAGNOSIS — E1142 Type 2 diabetes mellitus with diabetic polyneuropathy: Secondary | ICD-10-CM | POA: Diagnosis not present

## 2023-10-18 NOTE — Telephone Encounter (Signed)
 I had decreased her insulin  doses significantly in the last/most recent visit to Lantus  25 units daily and NovoLog  CeQur 3 clicks with meals.  Please check the insulin  doses patient is currently taking.  If she is having low blood sugar we need to decrease the dose of insulin  rather than decreasing dose of Mounjaro .

## 2023-10-18 NOTE — Progress Notes (Signed)
 Sent a message to discuss the interaction between carbamazepine  and Eliquis .  She reports that carbamazepine  was stopped September 14.  So the concern about drug interaction is no longer an issue.  Will remove the carbamazepine  from her medication list.

## 2023-10-18 NOTE — Telephone Encounter (Signed)
 Patient called stating she feels the Mounjaro  dose is too high as it tends to drop her blood glucose levels low for 3-4 days after taking it and she has a hard time getting it back up.

## 2023-10-19 DIAGNOSIS — M25551 Pain in right hip: Secondary | ICD-10-CM | POA: Diagnosis not present

## 2023-10-19 DIAGNOSIS — E039 Hypothyroidism, unspecified: Secondary | ICD-10-CM | POA: Diagnosis not present

## 2023-10-19 DIAGNOSIS — F319 Bipolar disorder, unspecified: Secondary | ICD-10-CM | POA: Diagnosis not present

## 2023-10-19 DIAGNOSIS — E1165 Type 2 diabetes mellitus with hyperglycemia: Secondary | ICD-10-CM | POA: Diagnosis not present

## 2023-10-19 DIAGNOSIS — I4891 Unspecified atrial fibrillation: Secondary | ICD-10-CM | POA: Diagnosis not present

## 2023-10-19 DIAGNOSIS — I130 Hypertensive heart and chronic kidney disease with heart failure and stage 1 through stage 4 chronic kidney disease, or unspecified chronic kidney disease: Secondary | ICD-10-CM | POA: Diagnosis not present

## 2023-10-19 DIAGNOSIS — E1122 Type 2 diabetes mellitus with diabetic chronic kidney disease: Secondary | ICD-10-CM | POA: Diagnosis not present

## 2023-10-19 DIAGNOSIS — I5032 Chronic diastolic (congestive) heart failure: Secondary | ICD-10-CM | POA: Diagnosis not present

## 2023-10-19 DIAGNOSIS — E1142 Type 2 diabetes mellitus with diabetic polyneuropathy: Secondary | ICD-10-CM | POA: Diagnosis not present

## 2023-10-19 DIAGNOSIS — M00151 Pneumococcal arthritis, right hip: Secondary | ICD-10-CM | POA: Diagnosis not present

## 2023-10-19 DIAGNOSIS — N1831 Chronic kidney disease, stage 3a: Secondary | ICD-10-CM | POA: Diagnosis not present

## 2023-10-19 DIAGNOSIS — M7071 Other bursitis of hip, right hip: Secondary | ICD-10-CM | POA: Diagnosis not present

## 2023-10-19 NOTE — Telephone Encounter (Addendum)
 Continue current dose of Mounjaro  12.5 mg weekly.  Decrease Lantus  to 20 units daily.  Decrease NovoLog  to 6 units with meals up to 3 times a day, 6 units means 3 clicks on CeQur.   [place]ra[place] Dontrell Stuck, MD [facility] Endocrinology [facility] [address] [place], [address] [place], [place] [address] Phone # [phone]

## 2023-10-19 NOTE — Telephone Encounter (Signed)
 Signed orders by provider faxed to [facility] at [phone]

## 2023-10-21 DIAGNOSIS — E1122 Type 2 diabetes mellitus with diabetic chronic kidney disease: Secondary | ICD-10-CM | POA: Diagnosis not present

## 2023-10-21 DIAGNOSIS — E039 Hypothyroidism, unspecified: Secondary | ICD-10-CM | POA: Diagnosis not present

## 2023-10-21 DIAGNOSIS — I5032 Chronic diastolic (congestive) heart failure: Secondary | ICD-10-CM | POA: Diagnosis not present

## 2023-10-21 DIAGNOSIS — N1831 Chronic kidney disease, stage 3a: Secondary | ICD-10-CM | POA: Diagnosis not present

## 2023-10-21 DIAGNOSIS — E1142 Type 2 diabetes mellitus with diabetic polyneuropathy: Secondary | ICD-10-CM | POA: Diagnosis not present

## 2023-10-21 DIAGNOSIS — I130 Hypertensive heart and chronic kidney disease with heart failure and stage 1 through stage 4 chronic kidney disease, or unspecified chronic kidney disease: Secondary | ICD-10-CM | POA: Diagnosis not present

## 2023-10-21 DIAGNOSIS — E1165 Type 2 diabetes mellitus with hyperglycemia: Secondary | ICD-10-CM | POA: Diagnosis not present

## 2023-10-21 DIAGNOSIS — F319 Bipolar disorder, unspecified: Secondary | ICD-10-CM | POA: Diagnosis not present

## 2023-10-21 DIAGNOSIS — I4891 Unspecified atrial fibrillation: Secondary | ICD-10-CM | POA: Diagnosis not present

## 2023-10-24 DIAGNOSIS — E1142 Type 2 diabetes mellitus with diabetic polyneuropathy: Secondary | ICD-10-CM | POA: Diagnosis not present

## 2023-10-24 DIAGNOSIS — E1165 Type 2 diabetes mellitus with hyperglycemia: Secondary | ICD-10-CM | POA: Diagnosis not present

## 2023-10-24 DIAGNOSIS — I4891 Unspecified atrial fibrillation: Secondary | ICD-10-CM | POA: Diagnosis not present

## 2023-10-24 DIAGNOSIS — I5032 Chronic diastolic (congestive) heart failure: Secondary | ICD-10-CM | POA: Diagnosis not present

## 2023-10-24 DIAGNOSIS — I130 Hypertensive heart and chronic kidney disease with heart failure and stage 1 through stage 4 chronic kidney disease, or unspecified chronic kidney disease: Secondary | ICD-10-CM | POA: Diagnosis not present

## 2023-10-24 DIAGNOSIS — F319 Bipolar disorder, unspecified: Secondary | ICD-10-CM | POA: Diagnosis not present

## 2023-10-24 DIAGNOSIS — E039 Hypothyroidism, unspecified: Secondary | ICD-10-CM | POA: Diagnosis not present

## 2023-10-24 DIAGNOSIS — N1831 Chronic kidney disease, stage 3a: Secondary | ICD-10-CM | POA: Diagnosis not present

## 2023-11-03 ENCOUNTER — Telehealth: Payer: Self-pay | Admitting: Neurology

## 2023-11-03 DIAGNOSIS — E039 Hypothyroidism, unspecified: Secondary | ICD-10-CM | POA: Diagnosis not present

## 2023-11-03 NOTE — Telephone Encounter (Signed)
 Noted

## 2023-11-03 NOTE — Telephone Encounter (Signed)
 Last seen by Dr. Onita 09/12/23 and next f/u 01/26/24.  Refill already sent to this pharmacy back on 08/15/23 #60 and 11 refills (1 yr supply)  Phone room: please call pt back and let her know refills should be on file at her pharmacy

## 2023-11-03 NOTE — Telephone Encounter (Signed)
 Pt called needing a refill request for her propranolol  (INDERAL ) 10 MG tablet sent in to the [facility] on [address]

## 2023-11-04 DIAGNOSIS — M1611 Unilateral primary osteoarthritis, right hip: Secondary | ICD-10-CM | POA: Diagnosis not present

## 2023-11-09 DIAGNOSIS — I5032 Chronic diastolic (congestive) heart failure: Secondary | ICD-10-CM | POA: Diagnosis not present

## 2023-11-09 DIAGNOSIS — I130 Hypertensive heart and chronic kidney disease with heart failure and stage 1 through stage 4 chronic kidney disease, or unspecified chronic kidney disease: Secondary | ICD-10-CM | POA: Diagnosis not present

## 2023-11-09 DIAGNOSIS — F319 Bipolar disorder, unspecified: Secondary | ICD-10-CM | POA: Diagnosis not present

## 2023-11-09 DIAGNOSIS — E1142 Type 2 diabetes mellitus with diabetic polyneuropathy: Secondary | ICD-10-CM | POA: Diagnosis not present

## 2023-11-09 DIAGNOSIS — N1831 Chronic kidney disease, stage 3a: Secondary | ICD-10-CM | POA: Diagnosis not present

## 2023-11-09 DIAGNOSIS — E1165 Type 2 diabetes mellitus with hyperglycemia: Secondary | ICD-10-CM | POA: Diagnosis not present

## 2023-11-09 DIAGNOSIS — E039 Hypothyroidism, unspecified: Secondary | ICD-10-CM | POA: Diagnosis not present

## 2023-11-09 DIAGNOSIS — I4891 Unspecified atrial fibrillation: Secondary | ICD-10-CM | POA: Diagnosis not present

## 2023-11-09 DIAGNOSIS — E1122 Type 2 diabetes mellitus with diabetic chronic kidney disease: Secondary | ICD-10-CM | POA: Diagnosis not present

## 2023-11-14 ENCOUNTER — Telehealth: Payer: Self-pay | Admitting: Neurology

## 2023-11-14 NOTE — Telephone Encounter (Signed)
 Lamar PT from [facility] on Phone  calling to request verbal approval  for pt PT

## 2023-11-14 NOTE — Telephone Encounter (Signed)
 Called Robert back and gave VO for PT for Patient.

## 2023-11-15 DIAGNOSIS — M25551 Pain in right hip: Secondary | ICD-10-CM | POA: Diagnosis not present

## 2023-11-18 DIAGNOSIS — M1611 Unilateral primary osteoarthritis, right hip: Secondary | ICD-10-CM | POA: Diagnosis not present

## 2023-11-18 DIAGNOSIS — M7061 Trochanteric bursitis, right hip: Secondary | ICD-10-CM | POA: Diagnosis not present

## 2023-11-18 DIAGNOSIS — M25551 Pain in right hip: Secondary | ICD-10-CM | POA: Diagnosis not present

## 2023-11-18 DIAGNOSIS — R29818 Other symptoms and signs involving the nervous system: Secondary | ICD-10-CM | POA: Diagnosis not present

## 2023-11-24 DIAGNOSIS — E782 Mixed hyperlipidemia: Secondary | ICD-10-CM | POA: Diagnosis not present

## 2023-11-24 DIAGNOSIS — E1165 Type 2 diabetes mellitus with hyperglycemia: Secondary | ICD-10-CM | POA: Diagnosis not present

## 2023-11-24 DIAGNOSIS — N1831 Chronic kidney disease, stage 3a: Secondary | ICD-10-CM | POA: Diagnosis not present

## 2023-11-24 DIAGNOSIS — I5032 Chronic diastolic (congestive) heart failure: Secondary | ICD-10-CM | POA: Diagnosis not present

## 2023-11-25 DIAGNOSIS — E782 Mixed hyperlipidemia: Secondary | ICD-10-CM | POA: Diagnosis not present

## 2023-11-25 DIAGNOSIS — N1831 Chronic kidney disease, stage 3a: Secondary | ICD-10-CM | POA: Diagnosis not present

## 2023-11-25 DIAGNOSIS — E1165 Type 2 diabetes mellitus with hyperglycemia: Secondary | ICD-10-CM | POA: Diagnosis not present

## 2023-11-25 DIAGNOSIS — I5032 Chronic diastolic (congestive) heart failure: Secondary | ICD-10-CM | POA: Diagnosis not present

## 2023-11-25 DIAGNOSIS — I130 Hypertensive heart and chronic kidney disease with heart failure and stage 1 through stage 4 chronic kidney disease, or unspecified chronic kidney disease: Secondary | ICD-10-CM | POA: Diagnosis not present

## 2023-11-25 DIAGNOSIS — Z23 Encounter for immunization: Secondary | ICD-10-CM | POA: Diagnosis not present

## 2023-11-25 DIAGNOSIS — I10 Essential (primary) hypertension: Secondary | ICD-10-CM | POA: Diagnosis not present

## 2023-11-29 ENCOUNTER — Telehealth: Payer: Self-pay | Admitting: Neurology

## 2023-11-29 NOTE — Telephone Encounter (Signed)
 Pt called wanting to know if the provider can send in a request to [facility] for her to receive PT. Please advise.

## 2023-12-19 ENCOUNTER — Other Ambulatory Visit: Payer: Self-pay

## 2023-12-19 ENCOUNTER — Emergency Department (HOSPITAL_COMMUNITY)
Admission: EM | Admit: 2023-12-19 | Discharge: 2023-12-19 | Disposition: A | Discharge status: Home / Self Care | Attending: Emergency Medicine | Admitting: Emergency Medicine

## 2023-12-19 ENCOUNTER — Emergency Department (HOSPITAL_COMMUNITY)

## 2023-12-19 DIAGNOSIS — Z794 Long term (current) use of insulin: Secondary | ICD-10-CM | POA: Diagnosis not present

## 2023-12-19 DIAGNOSIS — R110 Nausea: Secondary | ICD-10-CM | POA: Diagnosis not present

## 2023-12-19 DIAGNOSIS — R251 Tremor, unspecified: Secondary | ICD-10-CM | POA: Insufficient documentation

## 2023-12-19 DIAGNOSIS — R519 Headache, unspecified: Secondary | ICD-10-CM | POA: Insufficient documentation

## 2023-12-19 DIAGNOSIS — Z7901 Long term (current) use of anticoagulants: Secondary | ICD-10-CM | POA: Insufficient documentation

## 2023-12-19 DIAGNOSIS — I672 Cerebral atherosclerosis: Secondary | ICD-10-CM | POA: Diagnosis not present

## 2023-12-19 DIAGNOSIS — R000 Tachycardia, unspecified: Secondary | ICD-10-CM | POA: Diagnosis not present

## 2023-12-19 DIAGNOSIS — R29818 Other symptoms and signs involving the nervous system: Secondary | ICD-10-CM | POA: Diagnosis not present

## 2023-12-19 DIAGNOSIS — R0689 Other abnormalities of breathing: Secondary | ICD-10-CM | POA: Diagnosis not present

## 2023-12-19 DIAGNOSIS — R457 State of emotional shock and stress, unspecified: Secondary | ICD-10-CM | POA: Diagnosis not present

## 2023-12-19 LAB — COMPREHENSIVE METABOLIC PANEL WITH GFR
ALT: 27 U/L (ref 0–44)
AST: 17 U/L (ref 15–41)
Albumin: 4.4 g/dL (ref 3.5–5.0)
Alkaline Phosphatase: 80 U/L (ref 38–126)
Anion gap: 12 (ref 5–15)
BUN: 29 mg/dL — ABNORMAL HIGH (ref 8–23)
CO2: 25 mmol/L (ref 22–32)
Calcium: 10.5 mg/dL — ABNORMAL HIGH (ref 8.9–10.3)
Chloride: 100 mmol/L (ref 98–111)
Creatinine, Ser: 1.11 mg/dL — ABNORMAL HIGH (ref 0.44–1.00)
GFR, Estimated: 49 mL/min — ABNORMAL LOW (ref >60)
Glucose, Bld: 117 mg/dL — ABNORMAL HIGH (ref 70–99)
Potassium: 4.5 mmol/L (ref 3.5–5.1)
Sodium: 137 mmol/L (ref 135–145)
Total Bilirubin: 0.5 mg/dL (ref 0.0–1.2)
Total Protein: 7.5 g/dL (ref 6.5–8.1)

## 2023-12-19 LAB — CBC
HCT: 37.2 % (ref 36.0–46.0)
Hemoglobin: 12.6 g/dL (ref 12.0–15.0)
MCH: 29.5 pg (ref 26.0–34.0)
MCHC: 33.9 g/dL (ref 30.0–36.0)
MCV: 87.1 fL (ref 80.0–100.0)
Platelets: 339 K/uL (ref 150–400)
RBC: 4.27 MIL/uL (ref 3.87–5.11)
RDW: 15 % (ref 11.5–15.5)
WBC: 8.7 K/uL (ref 4.0–10.5)
nRBC: 0 % (ref 0.0–0.2)

## 2023-12-19 LAB — CBG MONITORING, ED: Glucose-Capillary: 125 mg/dL — ABNORMAL HIGH (ref 70–99)

## 2023-12-19 NOTE — ED Triage Notes (Signed)
 Patient got new glucose meter and didn't think it was working and became very anxious.  EMS brought her in from home and tried to educate her prior to arrival after obtaining a glucose of 132.  Patient also noted to have BLE swelling she reports is new as of last two days.  Patient also has some tremors present.

## 2023-12-19 NOTE — ED Provider Notes (Signed)
 [place] [facility] AT [facility] Provider Note   CSN: [id number] Arrival date & time: 12/19/23  1548     Patient presents with: Anxiety   [name] is a 83 y.o. female.    Anxiety     Pt states she started shaking all over this morning.  She denies any fevers.  She is having some trouble with headaches.  She has had some nausea.  No vomiting.  Patient denies having trouble with shaking before.  She is not having trouble chest pain.  No fevers.  She is not having any trouble with numbness or weakness  EMS reports the patient called EMS because her glucose meter was not working and she became anxious.  Prior to Admission medications   Medication Sig Start Date End Date Taking? Authorizing Provider  apixaban  (ELIQUIS ) 5 MG TABS tablet TAKE 1 TABLET BY MOUTH 2 TIMES A DAY 09/30/23   Croitoru, Mihai, MD  Cholecalciferol  (QC VITAMIN D3) 50 MCG (2000 UT) TABS Take 2,000 Units by mouth daily.    [provider]  clobetasol (TEMOVATE) 0.05 % GEL Apply 1 application topically 2 (two) times daily as needed (rash).     [provider]  diltiazem  (CARDIZEM  CD) 240 MG 24 hr capsule TAKE 1 CAPSULE BY MOUTH DAILY 06/10/23   Croitoru, Mihai, MD  donepezil  (ARICEPT ) 10 MG tablet Take 10 mg by mouth at bedtime. 01/22/19   [provider]  esomeprazole  (NEXIUM ) 40 MG capsule Take 1 capsule (40 mg total) by mouth daily at 12 noon. 06/27/17   Jude Harden GAILS, MD  famotidine  (PEPCID ) 20 MG tablet Take 1 tablet (20 mg total) by mouth 2 (two) times daily. 01/27/21   Croitoru, Mihai, MD  FARXIGA  10 MG TABS tablet TAKE 1 TABLET BY MOUTH DAILY 06/10/23   Thapa, Sudan, MD  folic acid  (FOLVITE ) 1 MG tablet Take 1 mg by mouth daily.    [provider]  furosemide  (LASIX ) 40 MG tablet TAKE 1 TABLET BY MOUTH EVERY DAY 08/19/21   Croitoru, Mihai, MD  gabapentin  (NEURONTIN ) 300 MG capsule Take 300 mg by mouth 4 (four) times daily. Patient takes 1 capsule in  the am, 1 capsule in the afternoon and two capsules in the evening    [provider]  HYDROcodone -acetaminophen  (NORCO/VICODIN) 5-325 MG tablet Take 1 tablet by mouth every 6 (six) hours as needed. 09/24/22   [provider]  insulin  aspart (NOVOLOG ) 100 UNIT/ML injection Use up to 40 units / day via CeQur patch 06/30/23   Thapa, Sudan, MD  insulin  glargine (LANTUS  SOLOSTAR) 100 UNIT/ML Solostar Pen Inject 25 Units into the skin daily. 06/23/23   Thapa, Sudan, MD  levothyroxine  (SYNTHROID , LEVOTHROID) 75 MCG tablet Take 75 mcg by mouth daily before breakfast.    [provider]  montelukast  (SINGULAIR ) 5 MG chewable tablet Chew 5 mg by mouth at bedtime.    [provider]  OLANZapine  (ZYPREXA ) 20 MG tablet Take 20 mg by mouth at bedtime. 08/24/23   [provider]  olmesartan  (BENICAR ) 20 MG tablet Take 20 mg by mouth daily. 10/21/22   [provider]  ondansetron  (ZOFRAN ) 4 MG tablet Take 4 mg by mouth every 8 (eight) hours as needed for nausea or vomiting.    [provider]  pravastatin  (PRAVACHOL ) 20 MG tablet Take 20 mg by mouth daily. 07/14/20   [provider]  propranolol  (INDERAL ) 10 MG tablet Take 1 tablet (10 mg total) by mouth  3 (three) times daily. 08/15/23   Onita Duos, MD  QUEtiapine  (SEROQUEL ) 100 MG tablet Take 100 mg by mouth at bedtime. 12/29/18   [provider]  rosuvastatin (CRESTOR) 20 MG tablet Take 20 mg by mouth daily. 10/20/22   [provider]  spironolactone  (ALDACTONE ) 25 MG tablet TAKE 1 TABLET BY MOUTH EVERY DAY 10/19/21   Croitoru, Mihai, MD  tirzepatide  (MOUNJARO ) 12.5 MG/0.5ML Pen Inject 12.5 mg into the skin once a week. 06/23/23   Thapa, Sudan, MD  triamcinolone  cream (KENALOG) 0.1 % Apply 1 Application topically as needed.    [provider]  Vibegron 75 MG TABS Take 75 mg by mouth daily in the afternoon.    [provider]    Allergies: Flagyl [metronidazole hcl],  Ciprofloxacin, Metformin  and related, Methimazole, Milk-related compounds, and Other    Review of Systems  Updated Vital Signs BP (!) 121/49   Pulse (!) 103   Temp 98.1 F (36.7 C) (Oral)   Resp 17   SpO2 100%   Physical Exam Vitals and nursing note reviewed.  Constitutional:      General: She is not in acute distress.    Appearance: She is well-developed.  HENT:     Head: Normocephalic and atraumatic.     Right Ear: External ear normal.     Left Ear: External ear normal.  Eyes:     General: No scleral icterus.       Right eye: No discharge.        Left eye: No discharge.     Conjunctiva/sclera: Conjunctivae normal.  Neck:     Trachea: No tracheal deviation.  Cardiovascular:     Rate and Rhythm: Normal rate and regular rhythm.  Pulmonary:     Effort: Pulmonary effort is normal. No respiratory distress.     Breath sounds: Normal breath sounds. No stridor. No wheezing or rales.  Abdominal:     General: Bowel sounds are normal. There is no distension.     Palpations: Abdomen is soft.     Tenderness: There is no abdominal tenderness. There is no guarding or rebound.  Musculoskeletal:        General: No tenderness or deformity.     Cervical back: Neck supple.  Skin:    General: Skin is warm and dry.     Findings: No rash.  Neurological:     General: No focal deficit present.     Mental Status: She is alert.     Cranial Nerves: No cranial nerve deficit, dysarthria or facial asymmetry.     Sensory: No sensory deficit.     Motor: Tremor present. No weakness, abnormal muscle tone or seizure activity.     Coordination: Coordination normal.     Comments: No pronator drift, able to lift both arms and legs off bed without difficulty, resting tremor noted, resolves with movement  Psychiatric:        Mood and Affect: Mood normal.     (all labs ordered are listed, but only abnormal results are displayed) Labs Reviewed  COMPREHENSIVE METABOLIC PANEL WITH GFR - Abnormal;  Notable for the following components:      Result Value   Glucose, Bld 117 (*)    BUN 29 (*)    Creatinine, Ser 1.11 (*)    Calcium 10.5 (*)    GFR, Estimated 49 (*)    All other components within normal limits  CBG MONITORING, ED - Abnormal; Notable for the following components:   Glucose-Capillary  125 (*)    All other components within normal limits  CBC    EKG: None  Radiology: CT Head Wo Contrast Result Date: 12/19/2023 CLINICAL DATA:  Headache, neuro deficit EXAM: CT HEAD WITHOUT CONTRAST TECHNIQUE: Contiguous axial images were obtained from the base of the skull through the vertex without intravenous contrast. RADIATION DOSE REDUCTION: This exam was performed according to the departmental dose-optimization program which includes automated exposure control, adjustment of the mA and/or kV according to patient size and/or use of iterative reconstruction technique. COMPARISON:  08/20/2020, brain MRI 07/13/2023 FINDINGS: Brain: No intracranial hemorrhage. Stable degree of atrophy and chronic small vessel ischemia from prior MRI. No hydrocephalus. The basilar cisterns are patent. Falx lipomas are stable from prior exams, no associated mass effect. No evidence of territorial infarct or acute ischemia. No extra-axial or intracranial fluid collection. Vascular: Atherosclerosis of skullbase vasculature without hyperdense vessel or abnormal calcification. Skull: No fracture or focal lesion. Sinuses/Orbits: No acute finding. Other: None. IMPRESSION: 1. No acute intracranial abnormality. 2. Atrophy and chronic small vessel ischemia. Electronically Signed   By: Andrea Gasman M.D.   On: 12/19/2023 18:02     Procedures   Medications Ordered in the ED - No data to display  Clinical Course as of 12/19/23 1853  Mon Dec 19, 2023  1825 Head CT without acute abnormality [JK]  1825 CBC CBC normal [JK]  1826 Comprehensive metabolic panel(!) No significant abnormalities  [JK]    Clinical Course  User Index [JK] Randol Simmonds, MD                                 Medical Decision Making Differential diagnosis includes but not limited to electrolyte disturbance, cerebral hemorrhage, stroke, seizure  Problems Addressed: Tremor: acute illness or injury  Amount and/or Complexity of Data Reviewed Labs: ordered. Decision-making details documented in ED Course. Radiology: ordered and independent interpretation performed.   Patient presented to the ED for evaluation of a tremor.  Patient reports this is a new issue.  Old records reviewed.  Patient has seen Dr. Onita from [facility] in August of this year.  There is documentation of patient having difficulty with the tremor.  Suspected to be most likely underlying essential tremor but medication side effects were also concerned.  Patient's laboratory test unremarkable.  No signs of significant electrolyte abnormality.  Head CT did not show any acute abnormality.  Presentation not consistent with seizure.  Patient is alert and awake during the whole episode.  The tremor is diffuse  Daughter will also mentions some concerns with patient's memory and some difficulty remembering some words.  Not clearly in aphasia by her description and patient's not having any signs of aphasia or focal neurologic deficits here other than the tremor but I did discussed options of being admitted to the hospital to have further evaluation such as MRI to rule out occult stroke.  Patient herself would like to go home.  They discussed being admitted with the patient's son and they still would like to go home and follow-up with their doctor as an outpatient.       Final diagnoses:  Tremor    ED Discharge Orders          Ordered    Ambulatory referral to Neurology       Comments: An appointment is requested in approximately: 2 weeks   12/19/23 1852  Randol Simmonds, MD 12/19/23 [phone]

## 2023-12-20 DIAGNOSIS — E1165 Type 2 diabetes mellitus with hyperglycemia: Secondary | ICD-10-CM | POA: Diagnosis not present

## 2023-12-29 ENCOUNTER — Ambulatory Visit: Admitting: Endocrinology

## 2024-01-20 ENCOUNTER — Encounter (INDEPENDENT_AMBULATORY_CARE_PROVIDER_SITE_OTHER): Payer: TRICARE For Life (TFL) | Admitting: Ophthalmology

## 2024-01-20 DIAGNOSIS — E113393 Type 2 diabetes mellitus with moderate nonproliferative diabetic retinopathy without macular edema, bilateral: Secondary | ICD-10-CM | POA: Diagnosis not present

## 2024-01-20 DIAGNOSIS — I10 Essential (primary) hypertension: Secondary | ICD-10-CM

## 2024-01-20 DIAGNOSIS — H35033 Hypertensive retinopathy, bilateral: Secondary | ICD-10-CM

## 2024-01-20 DIAGNOSIS — H338 Other retinal detachments: Secondary | ICD-10-CM

## 2024-01-20 DIAGNOSIS — H43813 Vitreous degeneration, bilateral: Secondary | ICD-10-CM | POA: Diagnosis not present

## 2024-01-20 DIAGNOSIS — Z794 Long term (current) use of insulin: Secondary | ICD-10-CM

## 2024-01-26 ENCOUNTER — Ambulatory Visit (INDEPENDENT_AMBULATORY_CARE_PROVIDER_SITE_OTHER): Admitting: Neurology

## 2024-01-26 ENCOUNTER — Encounter: Payer: Self-pay | Admitting: Neurology

## 2024-01-26 VITALS — BP 100/62 | HR 82 | Ht 63.0 in | Wt 193.0 lb

## 2024-01-26 DIAGNOSIS — R269 Unspecified abnormalities of gait and mobility: Secondary | ICD-10-CM

## 2024-01-26 DIAGNOSIS — R251 Tremor, unspecified: Secondary | ICD-10-CM | POA: Diagnosis not present

## 2024-01-26 DIAGNOSIS — R413 Other amnesia: Secondary | ICD-10-CM

## 2024-01-26 NOTE — Progress Notes (Signed)
 "  Chief Complaint  Patient presents with   Follow-up    Pt in room 14.  Alone.Here tremor follow up.      ASSESSMENT AND PLAN  [name] is a 84 y.o. female   Tremor  mild difficulty drawing spiral circle, resting and posturing tremor, no significant rigidity bradykinesia  Long history of antipsychotic medications due to bipolar disorder, including Zyprexa , which has lower, but still potential for extrapyramidal symptoms,  DatScan  showed no evidence of dopamine deficiency,  Tremor most likely underlying essential tremor, medication side effect can contributed to  Improvement with better control for anxiety, and physical therapy,  Will continue to work with her PCP and psychiatrist on return for new issues   DIAGNOSTIC DATA (LABS, IMAGING, TESTING) - I reviewed patient records, labs, notes, testing and imaging myself where available.   MEDICAL HISTORY:  [name] is a 84 year old left handed female, seen in request by her primary care from [facility] Dr. Verdia, Lequita for evaluation of tremor, memory loss, initial evaluation July 05, 2023  History is obtained from the patient and review of electronic medical records. I personally reviewed pertinent available imaging films in PACS.   PMHx of  DM-insulin  dependent DM peripheral neuropathy HLD HTN Hypothyrodism Afib, CHF Bipolar disorder, polypharmacy, zyprexa  20mg  at bedtime, seroquel  100mg  at bedtime, Carbatrol  200mg  bid Stroke History of kidney stone  Patient is a retired high school Retail buyer, lives alone, drove to clinic today,  Since 2024, she began to develop this tremor, all over her body, gradually getting worse, almost constant, bothersome, but no limitation in her daily activity, I know I got it, I should not have it  She is sedentary, walk half mile few times each week, denies significant gait change, she woke up frequently during her sleep using bathroom, has a  long history of urinary urgency,  She had long history of bipolar disorder, on polypharmacy, including Zyprexa , Seroquel , Carbatrol , because of her tremor,  a trial with her psychiatrist of lower dose of Zyprexa  without any improvement  She has decreased smell for many years, denied REM sleep disorder, or excessive movement during sleep,  Her mother suffered tremor in her old age, similar to hers, but 5 other sisters do not have similar issues  Labs in June 2025, A1C 7.6, Na 133, creat 1.06, LDL 58,  UPDATE July 28th 2025: She is bothered by her intermittent tremor, but denied major limitation, mild difficulty drawing spiral circle,  MRI of the brain showed mild to moderate small vessel disease, already on Eliquis  due to history of A-fib  DaTSCAN  was negative for dopamine deficiency  Extensive laboratory evaluation were essentially normal, including ATN profile, Alzheimer's genetic testing, there was no treatable etiology identified, normal B12 TSH, RPR  UPDATE August 25th 2025: During her physical therapy depression at [facility] on August 19th 2025, she developed whole body tremor, shortness of breath, was evaluated at emergency room, cardiac workup was negative Laboratory evaluation showed no significant abnormality including CMP with exception of mild elevation of glucose 237, creatinine 1.2  Her son showed me a videotape of her whole body tremor, arm thrashing movement, after discharge, she was again taken to [facility] emergency room,  CT head showed no acute abnormality  Laboratory evaluation September 07, 2023 from [facility], CMP showed mild elevation of creatinine 1.2, glucose 239, negative troponin, magnesium  slight elevation 2.7, Tegretol  level 5.0,  EEG showed diffuse slowing, no epileptiform discharge, per family, she did had a  whole body shaking episode during recording  She has gained her strength back over the past few days,she was able to drving, shopping again, but today,  she felt weak, had a mild body shaking episode,  UPDATE Jan 8th 2025: She was treated at ED on Dec 19 2023, shaking all over, with anxiety  CT head without contrast generalized atrophy small vessel disease no acute abnormality Laboratory evaluation showed normal CBC, CMP showed elevation in creatinine 1.11 mild hide calcium 10.5, A1c was elevated 7.8, she was  She has been working with her psychiatrist, had a lot of medication change with some improvement of her symptoms, Zyprexa  has helped her sleep is receiving physical therapy which has been helpful  PHYSICAL EXAM:   Vitals:   01/26/24 1109  BP: 100/62  Pulse: 82  SpO2: 99%  Weight: 193 lb (87.5 kg)  Height: 5' 3 (1.6 m)     PHYSICAL EXAMNIATION:  Gen: NAD, conversant, well nourised, well groomed                     Cardiovascular: Regular rate rhythm, no peripheral edema, warm, nontender. Eyes: Conjunctivae clear without exudates or hemorrhage Neck: Supple, no carotid bruits. Pulmonary: Clear to auscultation bilaterally   NEUROLOGICAL EXAM:  MENTAL STATUS: Speech/cognition: Anxious looking elderly female, awake, alert, oriented to history taking and casual conversation    07/05/2023   11:00 AM  Montreal Cognitive Assessment   Visuospatial/ Executive (0/5) 5  Naming (0/3) 3  Attention: Read list of digits (0/2) 2  Attention: Read list of letters (0/1) 1  Attention: Serial 7 subtraction starting at 100 (0/3) 1  Language: Repeat phrase (0/2) 2  Language : Fluency (0/1) 0  Abstraction (0/2) 2  Delayed Recall (0/5) 2  Orientation (0/6) 5  Total 23    CRANIAL NERVES: CN II: Visual fields are full to confrontation. Pupils are round equal and briskly reactive to light. CN III, IV, VI: extraocular movement are normal. No ptosis. CN V: Facial sensation is intact to light touch CN VII: Face is symmetric with normal eye closure  CN VIII: Hearing is normal to causal conversation. CN IX, X: Phonation is normal. CN XI:  Head turning and shoulder shrug are intact  MOTOR: Mild bilateral hands tremor tremor, normal strength, no significant bradykinesia, rigidity, mild to moderate difficulty drawing spiral circle  REFLEXES: Reflexes are 1 and symmetric at the biceps, triceps, knees, and ankles. Plantar responses are flexor.  SENSORY: Intact to light touch, vibratory sensation at the ankle level  COORDINATION: There is no trunk or limb dysmetria noted.  GAIT/STANCE: Push-up cautious, mildly unsteady  REVIEW OF SYSTEMS:  Full 14 system review of systems performed and notable only for as above All other review of systems were negative.   ALLERGIES: Allergies  Allergen Reactions   Flagyl [Metronidazole Hcl] Itching and Rash   Ciprofloxacin Itching and Rash   Metformin  And Related Rash   Methimazole Rash   Milk-Related Compounds Other (See Comments)    Stomach pains   Other Other (See Comments)    Brazil nuts cause severe facial redness    HOME MEDICATIONS: Current Outpatient Medications  Medication Sig Dispense Refill   apixaban  (ELIQUIS ) 5 MG TABS tablet TAKE 1 TABLET BY MOUTH 2 TIMES A DAY 180 tablet 1   Cholecalciferol  (QC VITAMIN D3) 50 MCG (2000 UT) TABS Take 2,000 Units by mouth daily.     clobetasol (TEMOVATE) 0.05 % GEL Apply 1 application topically 2 (two) times  daily as needed (rash).      diltiazem  (CARDIZEM  CD) 240 MG 24 hr capsule TAKE 1 CAPSULE BY MOUTH DAILY 90 capsule 3   donepezil  (ARICEPT ) 10 MG tablet Take 10 mg by mouth at bedtime.     esomeprazole  (NEXIUM ) 40 MG capsule Take 1 capsule (40 mg total) by mouth daily at 12 noon. 30 capsule 2   famotidine  (PEPCID ) 20 MG tablet Take 1 tablet (20 mg total) by mouth 2 (two) times daily. 60 tablet 1   FARXIGA  10 MG TABS tablet TAKE 1 TABLET BY MOUTH DAILY 90 tablet 2   folic acid  (FOLVITE ) 1 MG tablet Take 1 mg by mouth daily.     furosemide  (LASIX ) 40 MG tablet TAKE 1 TABLET BY MOUTH EVERY DAY 90 tablet 1   gabapentin  (NEURONTIN )  300 MG capsule Take 300 mg by mouth 4 (four) times daily. Patient takes 1 capsule in the am, 1 capsule in the afternoon and two capsules in the evening (Patient taking differently: Take 300 mg by mouth 3 (three) times daily. Patient takes 1 capsule in the am, 1 capsule in the afternoon and two capsules in the evening)     insulin  aspart (NOVOLOG ) 100 UNIT/ML injection Use up to 40 units / day via CeQur patch 10 mL 11   insulin  glargine (LANTUS  SOLOSTAR) 100 UNIT/ML Solostar Pen Inject 25 Units into the skin daily. 30 mL 4   levothyroxine  (SYNTHROID , LEVOTHROID) 75 MCG tablet Take 75 mcg by mouth daily before breakfast.     montelukast  (SINGULAIR ) 5 MG chewable tablet Chew 5 mg by mouth at bedtime.     OLANZapine  (ZYPREXA ) 20 MG tablet Take 20 mg by mouth at bedtime. (Patient taking differently: Take 15 mg by mouth at bedtime.)     olmesartan  (BENICAR ) 20 MG tablet Take 20 mg by mouth daily.     ondansetron  (ZOFRAN ) 4 MG tablet Take 4 mg by mouth every 8 (eight) hours as needed for nausea or vomiting.     pravastatin  (PRAVACHOL ) 20 MG tablet Take 20 mg by mouth daily.     propranolol  (INDERAL ) 10 MG tablet Take 1 tablet (10 mg total) by mouth 3 (three) times daily. 60 tablet 11   QUEtiapine  (SEROQUEL ) 100 MG tablet Take 100 mg by mouth at bedtime. (Patient taking differently: Take 50 mg by mouth at bedtime.)     rosuvastatin (CRESTOR) 20 MG tablet Take 20 mg by mouth daily.     spironolactone  (ALDACTONE ) 25 MG tablet TAKE 1 TABLET BY MOUTH EVERY DAY 90 tablet 0   tirzepatide  (MOUNJARO ) 12.5 MG/0.5ML Pen Inject 12.5 mg into the skin once a week. 6 mL 4   triamcinolone  cream (KENALOG) 0.1 % Apply 1 Application topically as needed.     Vibegron 75 MG TABS Take 75 mg by mouth daily in the afternoon.     HYDROcodone -acetaminophen  (NORCO/VICODIN) 5-325 MG tablet Take 1 tablet by mouth every 6 (six) hours as needed. (Patient not taking: Reported on 01/26/2024)     No current facility-administered medications  for this visit.    PAST MEDICAL HISTORY: Past Medical History:  Diagnosis Date   Bipolar disorder (HCC)    CHF (congestive heart failure) (HCC)    Depression    Bipolar   Dysrhythmia    GERD (gastroesophageal reflux disease)    History of hiatal hernia    History of kidney stones    History of stomach ulcers 1970s   bleeding   Hyperlipidemia    Hypertension  Hypothyroidism    Ischemic colitis, enteritis, or enterocolitis    OSA on CPAP    Pericarditis 05/06/2017   Pneumonia    several times (06/23/2017)   Thyroid  disease    TIA (transient ischemic attack) 2011 X 2   Type II diabetes mellitus (HCC)    Urinary bladder incontinence    Vertigo     PAST SURGICAL HISTORY: Past Surgical History:  Procedure Laterality Date   APPENDECTOMY     BOTOX  INJECTION N/A 11/23/2022   Procedure: CYSTOBOTOX INJECTION;  Surgeon: Gaston Hamilton, MD;  Location: [facility];  Service: Urology;  Laterality: N/A;   BREAST CYST ASPIRATION Bilateral    CARDIAC CATHETERIZATION N/A 12/24/2014   Procedure: Right/Left Heart Cath and Coronary Angiography;  Surgeon: Jerel Balding, MD;  Location: [facility] INVASIVE CV LAB;  Service: Cardiovascular;  Laterality: N/A;   CATARACT EXTRACTION W/ INTRAOCULAR LENS  IMPLANT, BILATERAL Bilateral    EXCISIONAL HEMORRHOIDECTOMY     EYE SURGERY     FRACTURE SURGERY     IR THORACENTESIS RIGHT ASP PLEURAL SPACE W/IMG GUIDE  05/13/2017   LAPAROSCOPIC CHOLECYSTECTOMY     LITHOTRIPSY  several times   RETINAL DETACHMENT SURGERY     think it was on the left; not sure (06/23/2017)   TONSILLECTOMY     VAGINAL HYSTERECTOMY     partial     FAMILY HISTORY: Family History  Problem Relation Age of Onset   Heart disease Mother    Stroke Father    Parkinson's disease Father    Cancer Sister    Cancer Brother     SOCIAL HISTORY: Social History   Socioeconomic History   Marital status: Widowed    Spouse name: Not on file   Number of children: 2   Years of  education: 11   Highest education level: Not on file  Occupational History   Occupation: Retired  Tobacco Use   Smoking status: Never   Smokeless tobacco: Never  Vaping Use   Vaping status: Never Used  Substance and Sexual Activity   Alcohol use: Never   Drug use: Never   Sexual activity: Not on file  Other Topics Concern   Not on file  Social History Narrative   Lives at home alone.   Right-handed.   No caffeine use.   Social Drivers of Health   Tobacco Use: Low Risk (01/26/2024)   Patient History    Smoking Tobacco Use: Never    Smokeless Tobacco Use: Never    Passive Exposure: Not on file  Financial Resource Strain: Not on file  Food Insecurity: Not on file  Transportation Needs: Not on file  Physical Activity: Not on file  Stress: Not on file  Social Connections: Not on file  Intimate Partner Violence: Not on file  Depression (EYV7-0): Not on file  Alcohol Screen: Not on file  Housing: Unknown (09/07/2023)   Received from [facility]   Epic    Unable to Pay for Housing in the Last Year: Not on file    Number of Times Moved in the Last Year: Not on file    At any time in the past 12 months, were you homeless or living in a shelter (including now)?: No  Utilities: Not on file  Health Literacy: Not on file      Modena Callander, M.D. Ph.D.  [facility] [address] [place], [place] [address] Ph: [phone] Fax: [phone]  CC:  Verdia Lombard, MD [address]  [address] [place],  [place] [address]  Verdia Lombard, MD   "

## 2024-02-01 ENCOUNTER — Other Ambulatory Visit: Payer: Self-pay | Admitting: Neurology

## 2024-02-01 NOTE — Telephone Encounter (Signed)
 Last seen on 01/26/24  Follow up scheduled on 04/10/24   Dr.Yan was patient to continue medication? I didn't see it mentioned in note.

## 2024-02-02 ENCOUNTER — Ambulatory Visit: Payer: Self-pay | Admitting: Endocrinology

## 2024-02-02 ENCOUNTER — Encounter: Payer: Self-pay | Admitting: Endocrinology

## 2024-02-02 ENCOUNTER — Ambulatory Visit (INDEPENDENT_AMBULATORY_CARE_PROVIDER_SITE_OTHER): Admitting: Endocrinology

## 2024-02-02 VITALS — BP 108/50 | HR 69 | Resp 16 | Ht 63.0 in | Wt 193.2 lb

## 2024-02-02 DIAGNOSIS — Z794 Long term (current) use of insulin: Secondary | ICD-10-CM

## 2024-02-02 DIAGNOSIS — E1165 Type 2 diabetes mellitus with hyperglycemia: Secondary | ICD-10-CM | POA: Diagnosis not present

## 2024-02-02 DIAGNOSIS — E1149 Type 2 diabetes mellitus with other diabetic neurological complication: Secondary | ICD-10-CM | POA: Diagnosis not present

## 2024-02-02 DIAGNOSIS — E1169 Type 2 diabetes mellitus with other specified complication: Secondary | ICD-10-CM | POA: Diagnosis not present

## 2024-02-02 LAB — POCT GLYCOSYLATED HEMOGLOBIN (HGB A1C): Hemoglobin A1C: 6.9 % — AB (ref 4.0–5.6)

## 2024-02-02 MED ORDER — CEQUR SIMPLICITY 2U DEVI: 1.0000 | 3 refills | Status: AC

## 2024-02-02 MED ORDER — INSULIN ASPART 100 UNIT/ML IJ SOLN: INTRAMUSCULAR | 11 refills | Status: AC

## 2024-02-02 MED ORDER — LANTUS SOLOSTAR 100 UNIT/ML ~~LOC~~ SOPN: 18.0000 [IU] | PEN_INJECTOR | Freq: Every day | SUBCUTANEOUS | 4 refills | Status: AC

## 2024-02-02 NOTE — Progress Notes (Signed)
 "  Outpatient Endocrinology Note Donovan Persley, MD  02/02/24  Patient's Name: [name]    DOB: [birth year]    MRN: [id number]                                                    REASON OF VISIT: Follow up for type 2 diabetes mellitus  PCP: Verdia Lombard, MD  HISTORY OF PRESENT ILLNESS:   [name] is a 84 y.o. old female with past medical history listed below, is here for follow up of type 2 diabetes mellitus.   Pertinent Diabetes History: Patient was diagnosed with type 2 diabetes mellitus in 2011.  She was apparently intolerant to metformin  and was treated with Onglyza until about 2014.  Insulin  therapy was started around 2014 initially on basal insulin  and later mealtime insulin  was added.  She has been on V-Go insulin  pump since 09/2014.   Chronic Diabetes Complications : Retinopathy: no. Last ophthalmology exam was done on annually, reportedly.  Nephropathy: no, on olmesartan  Peripheral neuropathy: yes, painful paresthesia, on gabapentin . Coronary artery disease: no Stroke: yes, TIA  Relevant comorbidities and cardiovascular risk factors: Obesity: yes Body mass index is 34.22 kg/m.  Hypertension: yes Hyperlipidemia. Yes,on statin  Current / Home Diabetic regimen includes: Farxiga  10 mg daily. Mounjaro  12.5 mg weekly. Lantus  20 units daily in the morning.   CeQur simplicity  6 units ( 3 squeezes)  with meals three times a day.  Taking after meal.  Lantus  was gradually decreased from 40 to 35 to 20 units daily over time.  Prior diabetic medications: Metformin  - intolerance.  Onglyza V-Go pump 40 units basal, meal times with 10 - 14 units with breakfast, lunch and dinner.  For  meals breakfast, lunch and supper 5-7 click depending upon meal size. For large meal take 7 click, regular meal take 6 click and smaller meal 5 clicks.  This will be 10 to 14 units with each meals.  Glycemic data:    CONTINUOUS GLUCOSE MONITORING SYSTEM (CGMS)  INTERPRETATION: At today's visit, we reviewed CGM downloads. The full report is scanned in the media. Reviewing the CGM trends, blood glucose are as follows:  FreeStyle Libre 3 CGM-  Sensor Download (Sensor download was reviewed and summarized below.) Dates: January 2 to February 02, 2024, 14 days Sensor Average: 132 Glucose Management Indicator: 6.5%  % data captured: 96%    Previous:     Previous:    Interpretation: Mostly acceptable blood sugar with occasional hypoglycemia with blood sugar in 60s overnight and in between the meals.  No concerning hyperglycemia.  Relatively decreasing percentage of hypoglycemia compared to last visit.  Hypoglycemia: Patient has occasional mild hypoglycemic episodes. Patient has hypoglycemia awareness.  Factors modifying glucose control: 1.  Diabetic diet assessment: three meals a day.  2.  Staying active or exercising: no formal exercise. Walks indoor.   3.  Medication compliance: compliant all of the time.  # Primary hypothyroidism : for several years, on levothyroxine  75 mcg daily,  managed by PCP.   Interval history  CGM data as reviewed above.  Diabetes regimen as reviewed and noted above.  She reports she has been eating less lately, low appetite, lost about 25 pounds of weight in last 3 months.  She has been taking Mounjaro  2.5 mg weekly.  No other complaints today.  REVIEW OF SYSTEMS As per history of present illness.   PAST MEDICAL HISTORY: Past Medical History:  Diagnosis Date   Bipolar disorder (HCC)    CHF (congestive heart failure) (HCC)    Depression    Bipolar   Dysrhythmia    GERD (gastroesophageal reflux disease)    History of hiatal hernia    History of kidney stones    History of stomach ulcers 1970s   bleeding   Hyperlipidemia    Hypertension    Hypothyroidism    Ischemic colitis, enteritis, or enterocolitis    OSA on CPAP    Pericarditis 05/06/2017   Pneumonia    several times (06/23/2017)    Thyroid  disease    TIA (transient ischemic attack) 2011 X 2   Type II diabetes mellitus (HCC)    Urinary bladder incontinence    Vertigo     PAST SURGICAL HISTORY: Past Surgical History:  Procedure Laterality Date   APPENDECTOMY     BOTOX  INJECTION N/A 11/23/2022   Procedure: CYSTOBOTOX INJECTION;  Surgeon: Gaston Hamilton, MD;  Location: [facility];  Service: Urology;  Laterality: N/A;   BREAST CYST ASPIRATION Bilateral    CARDIAC CATHETERIZATION N/A 12/24/2014   Procedure: Right/Left Heart Cath and Coronary Angiography;  Surgeon: Jerel Balding, MD;  Location: [facility];  Service: Cardiovascular;  Laterality: N/A;   CATARACT EXTRACTION W/ INTRAOCULAR LENS  IMPLANT, BILATERAL Bilateral    EXCISIONAL HEMORRHOIDECTOMY     EYE SURGERY     FRACTURE SURGERY     IR THORACENTESIS RIGHT ASP PLEURAL SPACE W/IMG GUIDE  05/13/2017   LAPAROSCOPIC CHOLECYSTECTOMY     LITHOTRIPSY  several times   RETINAL DETACHMENT SURGERY     think it was on the left; not sure (06/23/2017)   TONSILLECTOMY     VAGINAL HYSTERECTOMY     partial     ALLERGIES: Allergies  Allergen Reactions   Flagyl [Metronidazole Hcl] Itching and Rash   Ciprofloxacin Itching and Rash   Metformin  And Related Rash   Methimazole Rash   Milk-Related Compounds Other (See Comments)    Stomach pains   Other Other (See Comments)    Brazil nuts cause severe facial redness    FAMILY HISTORY:  Family History  Problem Relation Age of Onset   Heart disease Mother    Stroke Father    Parkinson's disease Father    Cancer Sister    Cancer Brother     SOCIAL HISTORY: Social History   Socioeconomic History   Marital status: Widowed    Spouse name: Not on file   Number of children: 2   Years of education: 27   Highest education level: Not on file  Occupational History   Occupation: Retired  Tobacco Use   Smoking status: Never   Smokeless tobacco: Never  Vaping Use   Vaping status: Never Used  Substance and  Sexual Activity   Alcohol use: Never   Drug use: Never   Sexual activity: Not on file  Other Topics Concern   Not on file  Social History Narrative   Lives at home alone.   Right-handed.   No caffeine use.   Social Drivers of Health   Tobacco Use: Low Risk (02/02/2024)   Patient History    Smoking Tobacco Use: Never    Smokeless Tobacco Use: Never    Passive Exposure: Not on file  Financial Resource Strain: Not on file  Food Insecurity: Not on file  Transportation Needs: Not on file  Physical Activity: Not on file  Stress: Not on file  Social Connections: Not on file  Depression (EYV7-0): Not on file  Alcohol Screen: Not on file  Housing: Unknown (09/07/2023)   Received from [facility]   Epic    Unable to Pay for Housing in the Last Year: Not on file    Number of Times Moved in the Last Year: Not on file    At any time in the past 12 months, were you homeless or living in a shelter (including now)?: No  Utilities: Not on file  Health Literacy: Not on file    MEDICATIONS:  Current Outpatient Medications  Medication Sig Dispense Refill   apixaban  (ELIQUIS ) 5 MG TABS tablet TAKE 1 TABLET BY MOUTH 2 TIMES A DAY 180 tablet 1   Cholecalciferol  (QC VITAMIN D3) 50 MCG (2000 UT) TABS Take 2,000 Units by mouth daily.     clobetasol (TEMOVATE) 0.05 % GEL Apply 1 application topically 2 (two) times daily as needed (rash).      diltiazem  (CARDIZEM  CD) 240 MG 24 hr capsule TAKE 1 CAPSULE BY MOUTH DAILY 90 capsule 3   donepezil  (ARICEPT ) 10 MG tablet Take 10 mg by mouth at bedtime.     esomeprazole  (NEXIUM ) 40 MG capsule Take 1 capsule (40 mg total) by mouth daily at 12 noon. 30 capsule 2   famotidine  (PEPCID ) 20 MG tablet Take 1 tablet (20 mg total) by mouth 2 (two) times daily. 60 tablet 1   FARXIGA  10 MG TABS tablet TAKE 1 TABLET BY MOUTH DAILY 90 tablet 2   folic acid  (FOLVITE ) 1 MG tablet Take 1 mg by mouth daily.     furosemide  (LASIX ) 40 MG tablet TAKE 1  TABLET BY MOUTH EVERY DAY 90 tablet 1   gabapentin  (NEURONTIN ) 300 MG capsule Take 300 mg by mouth 4 (four) times daily. Patient takes 1 capsule in the am, 1 capsule in the afternoon and two capsules in the evening (Patient taking differently: Take 300 mg by mouth 3 (three) times daily. Patient takes 1 capsule in the am, 1 capsule in the afternoon and two capsules in the evening)     HYDROcodone -acetaminophen  (NORCO/VICODIN) 5-325 MG tablet Take 1 tablet by mouth every 6 (six) hours as needed.     injection device for insulin  (CEQUR SIMPLICITY 2U) DEVI 1 each by Other route every 3 (three) days. 30 each 3   levothyroxine  (SYNTHROID , LEVOTHROID) 75 MCG tablet Take 75 mcg by mouth daily before breakfast.     montelukast  (SINGULAIR ) 5 MG chewable tablet Chew 5 mg by mouth at bedtime.     OLANZapine  (ZYPREXA ) 20 MG tablet Take 20 mg by mouth at bedtime.     olmesartan  (BENICAR ) 20 MG tablet Take 20 mg by mouth daily.     ondansetron  (ZOFRAN ) 4 MG tablet Take 4 mg by mouth every 8 (eight) hours as needed for nausea or vomiting.     pravastatin  (PRAVACHOL ) 20 MG tablet Take 20 mg by mouth daily.     propranolol  (INDERAL ) 10 MG tablet Take 1 tablet (10 mg total) by mouth 3 (three) times daily. 60 tablet 11   QUEtiapine  (SEROQUEL ) 100 MG tablet Take 100 mg by mouth at bedtime. (Patient taking differently: Take 50 mg by mouth at bedtime.)     rosuvastatin (CRESTOR) 20 MG tablet Take 20 mg by mouth daily.     spironolactone  (ALDACTONE ) 25 MG tablet TAKE 1 TABLET BY MOUTH EVERY DAY 90 tablet 0  tirzepatide  (MOUNJARO ) 12.5 MG/0.5ML Pen Inject 12.5 mg into the skin once a week. 6 mL 4   triamcinolone  cream (KENALOG) 0.1 % Apply 1 Application topically as needed.     Vibegron 75 MG TABS Take 75 mg by mouth daily in the afternoon.     insulin  aspart (NOVOLOG ) 100 UNIT/ML injection Use up to 20 units / day via CeQur patch 10 mL 11   insulin  glargine (LANTUS  SOLOSTAR) 100 UNIT/ML Solostar Pen Inject 18 Units into  the skin daily. 15 mL 4   No current facility-administered medications for this visit.    PHYSICAL EXAM: Vitals:   02/02/24 0929  BP: (!) 108/50  Pulse: 69  Resp: 16  SpO2: 99%  Weight: 193 lb 3.2 oz (87.6 kg)  Height: 5' 3 (1.6 m)     Body mass index is 34.22 kg/m.  Wt Readings from Last 3 Encounters:  02/02/24 193 lb 3.2 oz (87.6 kg)  01/26/24 193 lb (87.5 kg)  09/30/23 215 lb (97.5 kg)    General: Well developed, well nourished female in no apparent distress.  HEENT: AT/[place], no external lesions.  Eyes: Conjunctiva clear and no icterus. Neck: Neck supple  Lungs: Respirations not labored Neurologic: Alert, oriented, normal speech Extremities / Skin: Dry.  Psychiatric: Does not appear depressed or anxious  Diabetic Foot Exam - Simple   No data filed    LABS Reviewed Lab Results  Component Value Date   HGBA1C 6.9 (A) 02/02/2024   HGBA1C 7.8 (A) 09/30/2023   HGBA1C 7.6 (A) 06/23/2023   Lab Results  Component Value Date   FRUCTOSAMINE 267 05/28/2021   FRUCTOSAMINE 223 09/16/2020   FRUCTOSAMINE 259 10/17/2017   Lab Results  Component Value Date   CHOL 149 03/26/2017   HDL 56 12/24/2019   LDLCALC 82 12/24/2019   TRIG 130 03/26/2017   CHOLHDL 3.0 03/26/2017   Lab Results  Component Value Date   MICRALBCREAT 8 09/28/2023   Lab Results  Component Value Date   CREATININE 1.11 (H) 12/19/2023   Lab Results  Component Value Date   GFR 36.20 (L) 11/25/2022    ASSESSMENT / PLAN  1. Type 2 diabetes mellitus with other specified complication, with long-term current use of insulin  (HCC)   2. Type 2 diabetes mellitus with hyperglycemia, with long-term current use of insulin  (HCC)   3. Type 2 diabetes mellitus with neurological complications (HCC)     Diabetes Mellitus type 2, complicated by peripheral neuropathy. - Diabetic status / severity: Fair control.  Lab Results  Component Value Date   HGBA1C 6.9 (A) 02/02/2024    - Hemoglobin A1c goal :  <7.5%  Overall acceptable control of diabetes.    Still with occasional mild hypoglycemia.  Adjusted diabetes regimen as follows.   - Medications:  Diabetes regimen - Decrease Lantus  from 20 units to 18 units daily.   - Continue Mounjaro  12.5 mg weekly.  Patient has gradual weight loss and low appetite, will keep the current dose for now.  -Continue Farxiga  10 mg daily.  -Decrease CeQur simplicity 3 to 2 squeezes / 6 to 4 units with meals 3 times a day, advised to take before eating.  - Home glucose testing: Freestyle libre 3 CGM and check as needed.  - Discussed/ Gave Hypoglycemia treatment plan.  # Consult : No referral at this time.  # Annual urine for microalbuminuria/ creatinine ratio, no microalbuminuria currently, continue ACE/ARB /olmesartan . Last  Lab Results  Component Value Date   MICRALBCREAT 8  09/28/2023    # Foot check nightly / neuropathy, continue gabapentin .  # Annual dilated diabetic eye exams.   - Diet: Make healthy diabetic food choices - Life style / activity / exercise: Discussed.  2. Blood pressure  -  BP Readings from Last 1 Encounters:  02/02/24 (!) 108/50    - Control is in target.  - No change in current plans.    3. Lipid status / Hyperlipidemia - Last  Lab Results  Component Value Date   LDLCALC 82 12/24/2019   - Continue pravastatin  20 mg daily.  Managed by primary care provider.  Diagnoses and all orders for this visit:  Type 2 diabetes mellitus with other specified complication, with long-term current use of insulin  (HCC) -     POCT glycosylated hemoglobin (Hb A1C)  Type 2 diabetes mellitus with hyperglycemia, with long-term current use of insulin  (HCC) -     insulin  glargine (LANTUS  SOLOSTAR) 100 UNIT/ML Solostar Pen; Inject 18 Units into the skin daily.  Type 2 diabetes mellitus with neurological complications (HCC) -     insulin  aspart (NOVOLOG ) 100 UNIT/ML injection; Use up to 20 units / day via CeQur patch -      injection device for insulin  (CEQUR SIMPLICITY 2U) DEVI; 1 each by Other route every 3 (three) days.    DISPOSITION Follow up in clinic in 3 months suggested.   All questions answered and patient verbalized understanding of the plan.  Eligio Angert, MD [facility] Endocrinology [facility] [address] [place], [address] [place], [place] [address] Phone # [phone]  At least part of this note was generated using voice recognition software. Inadvertent word errors may have occurred, which were not recognized during the proofreading process. "

## 2024-02-02 NOTE — Patient Instructions (Signed)
 Lantus  18 units daily.  Farxiga  10 mg daily.  Continue mounjaro  12.5 mg weekly.  Cequr 2 clicks with 3 times a day with meals before eating.

## 2024-02-22 ENCOUNTER — Telehealth: Payer: Self-pay | Admitting: Neurology

## 2024-02-22 NOTE — Telephone Encounter (Signed)
 Pt called and LVM asking if she is to continue to take the propranolol  (INDERAL ) 10 MG tablet  Please advise.

## 2024-02-22 NOTE — Telephone Encounter (Signed)
 I stated to continue the propranolol  unless told to dc by provider. Pt voiced gratitude and understanding of all discussed

## 2024-04-10 ENCOUNTER — Ambulatory Visit: Admitting: Neurology

## 2024-04-16 ENCOUNTER — Ambulatory Visit: Admitting: Cardiovascular Disease

## 2024-05-17 ENCOUNTER — Ambulatory Visit: Admitting: Endocrinology

## 2025-01-21 ENCOUNTER — Encounter (INDEPENDENT_AMBULATORY_CARE_PROVIDER_SITE_OTHER): Admitting: Ophthalmology
